# Patient Record
Sex: Female | Born: 1935
Health system: Southern US, Community
[De-identification: ages and names within clinical notes are randomized; demographics above are authoritative.]

## PROBLEM LIST (undated history)

## (undated) DIAGNOSIS — E785 Hyperlipidemia, unspecified: Secondary | ICD-10-CM

## (undated) DIAGNOSIS — K449 Diaphragmatic hernia without obstruction or gangrene: Secondary | ICD-10-CM

## (undated) DIAGNOSIS — K922 Gastrointestinal hemorrhage, unspecified: Secondary | ICD-10-CM

## (undated) DIAGNOSIS — K529 Noninfective gastroenteritis and colitis, unspecified: Secondary | ICD-10-CM

## (undated) DIAGNOSIS — D62 Acute posthemorrhagic anemia: Secondary | ICD-10-CM

## (undated) DIAGNOSIS — K315 Obstruction of duodenum: Secondary | ICD-10-CM

## (undated) DIAGNOSIS — K222 Esophageal obstruction: Secondary | ICD-10-CM

## (undated) DIAGNOSIS — K838 Other specified diseases of biliary tract: Secondary | ICD-10-CM

## (undated) DIAGNOSIS — I10 Essential (primary) hypertension: Secondary | ICD-10-CM

## (undated) DIAGNOSIS — R42 Dizziness and giddiness: Secondary | ICD-10-CM

## (undated) DIAGNOSIS — I4891 Unspecified atrial fibrillation: Secondary | ICD-10-CM

## (undated) DIAGNOSIS — N200 Calculus of kidney: Secondary | ICD-10-CM

## (undated) DIAGNOSIS — K259 Gastric ulcer, unspecified as acute or chronic, without hemorrhage or perforation: Secondary | ICD-10-CM

## (undated) DIAGNOSIS — C4490 Unspecified malignant neoplasm of skin, unspecified: Secondary | ICD-10-CM

## (undated) DIAGNOSIS — K8689 Other specified diseases of pancreas: Secondary | ICD-10-CM

## (undated) DIAGNOSIS — K52839 Microscopic colitis, unspecified: Secondary | ICD-10-CM

## (undated) HISTORY — DX: Obstruction of duodenum: K31.5

## (undated) HISTORY — DX: Other specified diseases of pancreas: K86.89

## (undated) HISTORY — DX: Other specified diseases of biliary tract: K83.8

## (undated) HISTORY — DX: Diaphragmatic hernia without obstruction or gangrene: K44.9

## (undated) HISTORY — DX: Calculus of kidney: N20.0

## (undated) HISTORY — DX: Unspecified atrial fibrillation: I48.91

## (undated) HISTORY — DX: Acute posthemorrhagic anemia: D62

## (undated) HISTORY — DX: Esophageal obstruction: K22.2

## (undated) HISTORY — DX: Gastrointestinal hemorrhage, unspecified: K92.2

## (undated) HISTORY — PX: NOSE SURGERY: SHX723

## (undated) HISTORY — DX: Microscopic colitis, unspecified: K52.839

## (undated) HISTORY — DX: Gastric ulcer, unspecified as acute or chronic, without hemorrhage or perforation: K25.9

---

## 2001-01-16 ENCOUNTER — Ambulatory Visit (HOSPITAL_COMMUNITY): Admission: RE | Admit: 2001-01-16 | Discharge: 2001-01-16 | Payer: Self-pay | Admitting: Internal Medicine

## 2001-10-29 ENCOUNTER — Other Ambulatory Visit: Admission: RE | Admit: 2001-10-29 | Discharge: 2001-10-29 | Payer: Self-pay | Admitting: Dermatology

## 2010-09-30 ENCOUNTER — Emergency Department (HOSPITAL_COMMUNITY)
Admission: EM | Admit: 2010-09-30 | Discharge: 2010-09-30 | Disposition: A | Discharge status: Home / Self Care | Payer: Medicare Other | Attending: Emergency Medicine | Admitting: Emergency Medicine

## 2010-09-30 DIAGNOSIS — N390 Urinary tract infection, site not specified: Secondary | ICD-10-CM | POA: Insufficient documentation

## 2010-09-30 DIAGNOSIS — J329 Chronic sinusitis, unspecified: Secondary | ICD-10-CM | POA: Insufficient documentation

## 2010-09-30 DIAGNOSIS — I10 Essential (primary) hypertension: Secondary | ICD-10-CM | POA: Insufficient documentation

## 2010-09-30 DIAGNOSIS — R300 Dysuria: Secondary | ICD-10-CM | POA: Insufficient documentation

## 2010-09-30 DIAGNOSIS — Z79899 Other long term (current) drug therapy: Secondary | ICD-10-CM | POA: Insufficient documentation

## 2010-09-30 DIAGNOSIS — R42 Dizziness and giddiness: Secondary | ICD-10-CM | POA: Insufficient documentation

## 2010-09-30 DIAGNOSIS — R51 Headache: Secondary | ICD-10-CM | POA: Insufficient documentation

## 2010-09-30 DIAGNOSIS — E7800 Pure hypercholesterolemia, unspecified: Secondary | ICD-10-CM | POA: Insufficient documentation

## 2010-09-30 DIAGNOSIS — R110 Nausea: Secondary | ICD-10-CM | POA: Insufficient documentation

## 2010-09-30 LAB — URINALYSIS, ROUTINE W REFLEX MICROSCOPIC
Bilirubin Urine: NEGATIVE
Glucose, UA: NEGATIVE mg/dL
Hgb urine dipstick: NEGATIVE
Ketones, ur: NEGATIVE mg/dL
Nitrite: POSITIVE — AB
Protein, ur: NEGATIVE mg/dL
Specific Gravity, Urine: <1.005 — ABNORMAL LOW (ref 1.005–1.030)
Urobilinogen, UA: 0.2 mg/dL (ref 0.0–1.0)
pH: 6 (ref 5.0–8.0)

## 2010-09-30 LAB — URINE MICROSCOPIC-ADD ON

## 2010-10-03 LAB — URINE CULTURE
Colony Count: >=100000
Culture  Setup Time: 201207012151

## 2011-01-14 ENCOUNTER — Other Ambulatory Visit (HOSPITAL_COMMUNITY): Payer: Self-pay | Admitting: Family Medicine

## 2011-01-14 DIAGNOSIS — Z139 Encounter for screening, unspecified: Secondary | ICD-10-CM

## 2011-01-21 ENCOUNTER — Ambulatory Visit (HOSPITAL_COMMUNITY): Payer: Self-pay

## 2012-08-24 ENCOUNTER — Emergency Department (HOSPITAL_COMMUNITY)
Admission: EM | Admit: 2012-08-24 | Discharge: 2012-08-24 | Disposition: A | Discharge status: Home / Self Care | Payer: Medicare Other | Attending: Emergency Medicine | Admitting: Emergency Medicine

## 2012-08-24 ENCOUNTER — Emergency Department (HOSPITAL_COMMUNITY): Payer: Medicare Other

## 2012-08-24 ENCOUNTER — Encounter (HOSPITAL_COMMUNITY): Payer: Self-pay

## 2012-08-24 DIAGNOSIS — R5381 Other malaise: Secondary | ICD-10-CM | POA: Insufficient documentation

## 2012-08-24 DIAGNOSIS — E785 Hyperlipidemia, unspecified: Secondary | ICD-10-CM | POA: Insufficient documentation

## 2012-08-24 DIAGNOSIS — Z8719 Personal history of other diseases of the digestive system: Secondary | ICD-10-CM | POA: Insufficient documentation

## 2012-08-24 DIAGNOSIS — R55 Syncope and collapse: Secondary | ICD-10-CM | POA: Insufficient documentation

## 2012-08-24 DIAGNOSIS — I10 Essential (primary) hypertension: Secondary | ICD-10-CM | POA: Insufficient documentation

## 2012-08-24 DIAGNOSIS — Z85828 Personal history of other malignant neoplasm of skin: Secondary | ICD-10-CM | POA: Insufficient documentation

## 2012-08-24 DIAGNOSIS — Z79899 Other long term (current) drug therapy: Secondary | ICD-10-CM | POA: Insufficient documentation

## 2012-08-24 DIAGNOSIS — R109 Unspecified abdominal pain: Secondary | ICD-10-CM | POA: Insufficient documentation

## 2012-08-24 DIAGNOSIS — R197 Diarrhea, unspecified: Secondary | ICD-10-CM | POA: Insufficient documentation

## 2012-08-24 DIAGNOSIS — K529 Noninfective gastroenteritis and colitis, unspecified: Secondary | ICD-10-CM

## 2012-08-24 HISTORY — DX: Hyperlipidemia, unspecified: E78.5

## 2012-08-24 HISTORY — DX: Dizziness and giddiness: R42

## 2012-08-24 HISTORY — DX: Noninfective gastroenteritis and colitis, unspecified: K52.9

## 2012-08-24 HISTORY — DX: Unspecified malignant neoplasm of skin, unspecified: C44.90

## 2012-08-24 HISTORY — DX: Essential (primary) hypertension: I10

## 2012-08-24 LAB — COMPREHENSIVE METABOLIC PANEL
ALT: 13 U/L (ref 0–35)
AST: 24 U/L (ref 0–37)
Albumin: 4.2 g/dL (ref 3.5–5.2)
Alkaline Phosphatase: 83 U/L (ref 39–117)
BUN: 13 mg/dL (ref 6–23)
CO2: 27 mEq/L (ref 19–32)
Calcium: 10.1 mg/dL (ref 8.4–10.5)
Chloride: 99 mEq/L (ref 96–112)
Creatinine, Ser: 0.95 mg/dL (ref 0.50–1.10)
GFR calc Af Amer: 65 mL/min — ABNORMAL LOW (ref >90)
GFR calc non Af Amer: 56 mL/min — ABNORMAL LOW (ref >90)
Glucose, Bld: 104 mg/dL — ABNORMAL HIGH (ref 70–99)
Potassium: 4.1 mEq/L (ref 3.5–5.1)
Sodium: 136 mEq/L (ref 135–145)
Total Bilirubin: 0.2 mg/dL — ABNORMAL LOW (ref 0.3–1.2)
Total Protein: 7.8 g/dL (ref 6.0–8.3)

## 2012-08-24 LAB — CBC WITH DIFFERENTIAL/PLATELET
Basophils Absolute: 0 10*3/uL (ref 0.0–0.1)
Basophils Relative: 0 % (ref 0–1)
Eosinophils Absolute: 0.1 10*3/uL (ref 0.0–0.7)
Eosinophils Relative: 1 % (ref 0–5)
HCT: 37.2 % (ref 36.0–46.0)
Hemoglobin: 12.5 g/dL (ref 12.0–15.0)
Lymphocytes Relative: 15 % (ref 12–46)
Lymphs Abs: 1.6 10*3/uL (ref 0.7–4.0)
MCH: 29.8 pg (ref 26.0–34.0)
MCHC: 33.6 g/dL (ref 30.0–36.0)
MCV: 88.6 fL (ref 78.0–100.0)
Monocytes Absolute: 0.5 10*3/uL (ref 0.1–1.0)
Monocytes Relative: 5 % (ref 3–12)
Neutro Abs: 8.5 10*3/uL — ABNORMAL HIGH (ref 1.7–7.7)
Neutrophils Relative %: 79 % — ABNORMAL HIGH (ref 43–77)
Platelets: 283 10*3/uL (ref 150–400)
RBC: 4.2 MIL/uL (ref 3.87–5.11)
RDW: 13.5 % (ref 11.5–15.5)
WBC: 10.7 10*3/uL — ABNORMAL HIGH (ref 4.0–10.5)

## 2012-08-24 LAB — OCCULT BLOOD, POC DEVICE: Fecal Occult Bld: NEGATIVE

## 2012-08-24 LAB — URINALYSIS, ROUTINE W REFLEX MICROSCOPIC
Bilirubin Urine: NEGATIVE
Glucose, UA: NEGATIVE mg/dL
Hgb urine dipstick: NEGATIVE
Ketones, ur: NEGATIVE mg/dL
Nitrite: NEGATIVE
Protein, ur: NEGATIVE mg/dL
Specific Gravity, Urine: 1.015 (ref 1.005–1.030)
Urobilinogen, UA: 0.2 mg/dL (ref 0.0–1.0)
pH: 6 (ref 5.0–8.0)

## 2012-08-24 LAB — LIPASE, BLOOD: Lipase: 29 U/L (ref 11–59)

## 2012-08-24 LAB — URINE MICROSCOPIC-ADD ON

## 2012-08-24 LAB — TROPONIN I: Troponin I: <0.3 ng/mL (ref <0.30)

## 2012-08-24 LAB — CLOSTRIDIUM DIFFICILE BY PCR: Toxigenic C. Difficile by PCR: NEGATIVE

## 2012-08-24 IMAGING — CT CT ABD-PELV W/ CM
2 of 5 series · 16 of 46 positions shown, 18 images · IV contrast (Omnipaque 300)
Comparison: None.

CLINICAL DATA: Lower abdominal pain, diarrhea

CT ABDOMEN AND PELVIS WITH CONTRAST
TECHNIQUE: Multidetector CT imaging of the abdomen and pelvis was
performed following the standard protocol during bolus
administration of intravenous contrast.
Contrast: 100 ml [XR] IV

[Series 2: abd_pel_with 5.0 b40f · axial · 0.63mm/px · z∈[-423,-38]mm · 13 of 87 slices shown, 15 images]
[im 5/87  soft-tissue]
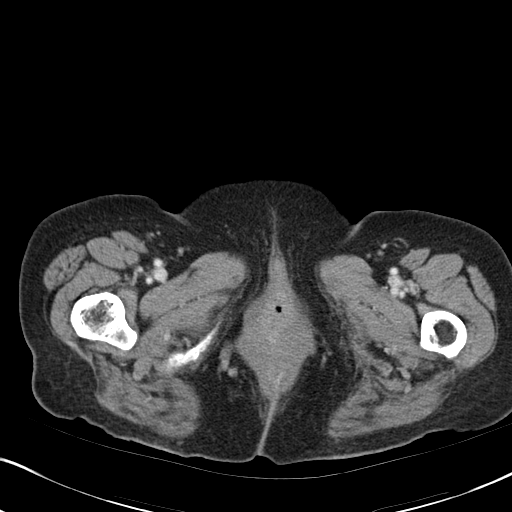
[im 5/87  bone]
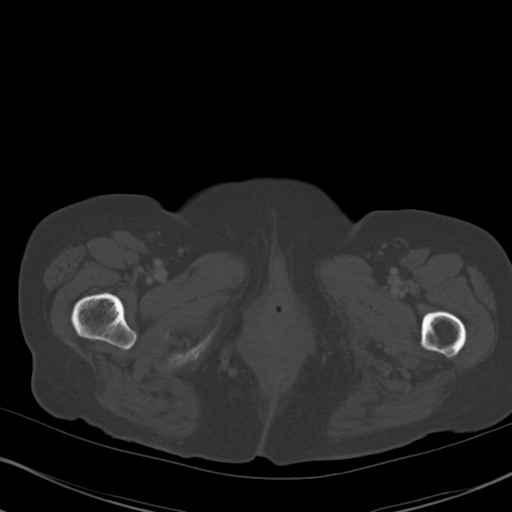
[im 10/87  soft-tissue]
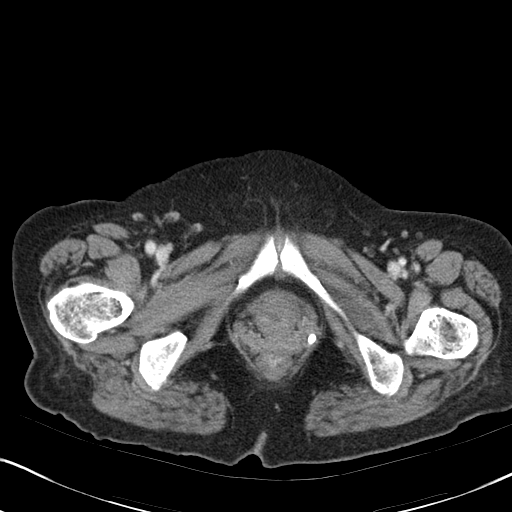
[im 20/87  soft-tissue]
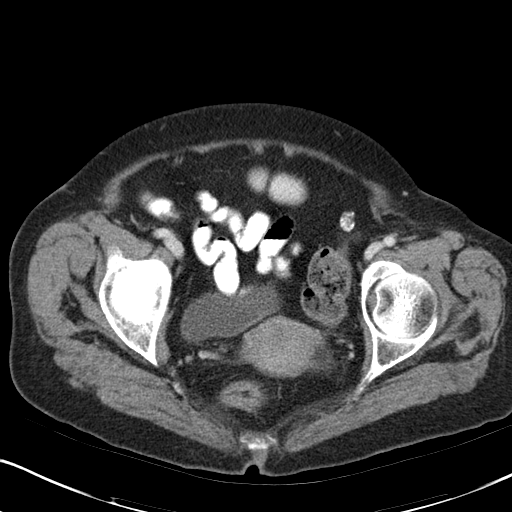
[im 24/87  soft-tissue]
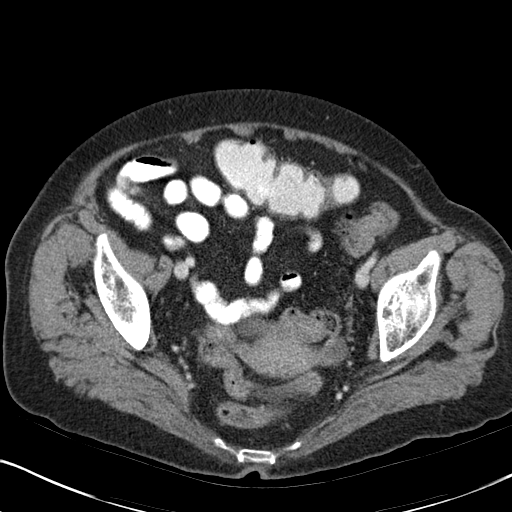
[im 29/87  soft-tissue]
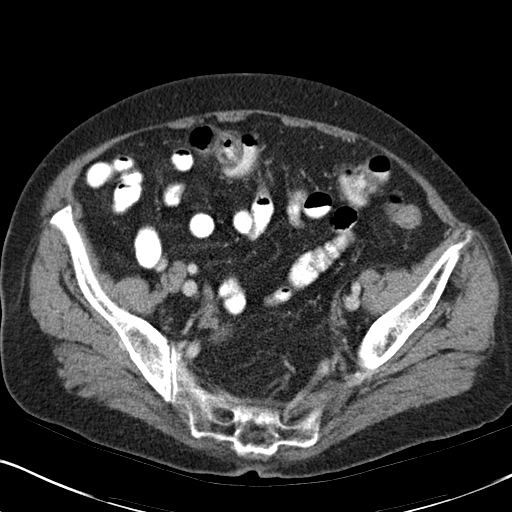
[im 39/87  soft-tissue]
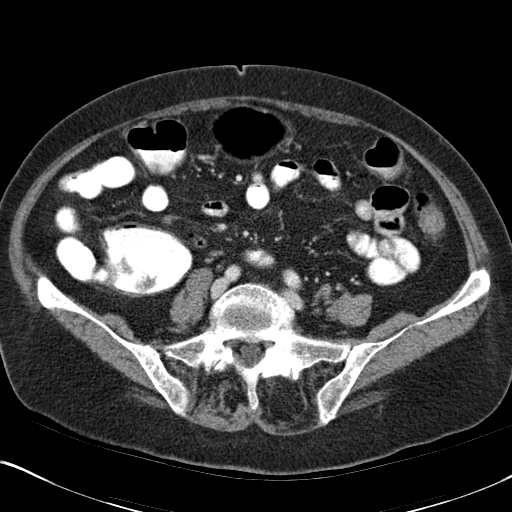
[im 44/87  soft-tissue]
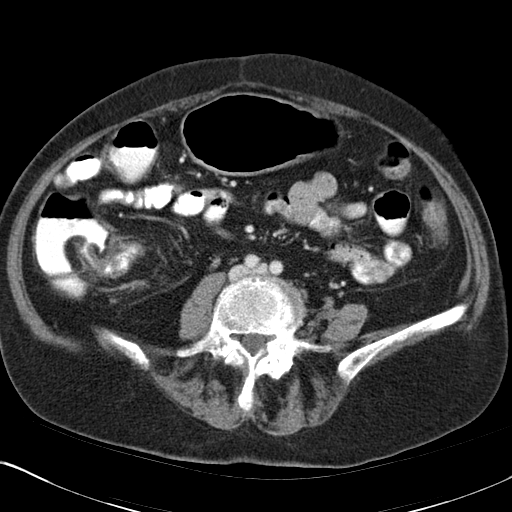
[im 48/87  soft-tissue]
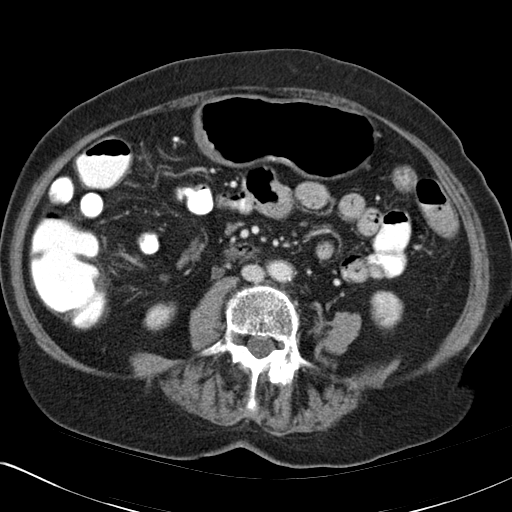
[im 58/87  soft-tissue]
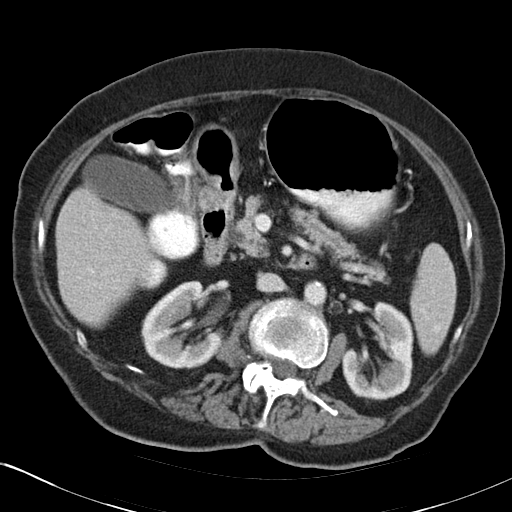
[im 58/87  bone]
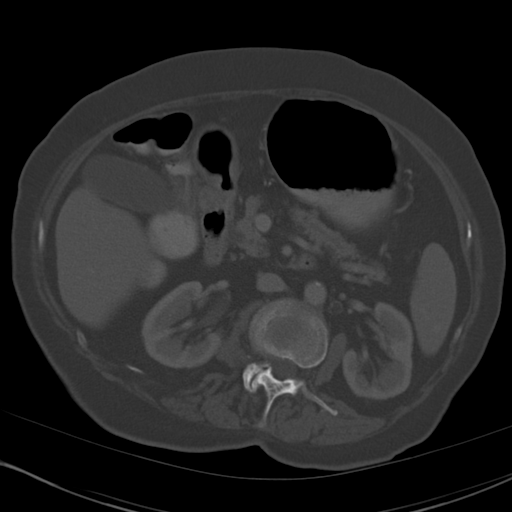
[im 63/87  soft-tissue]
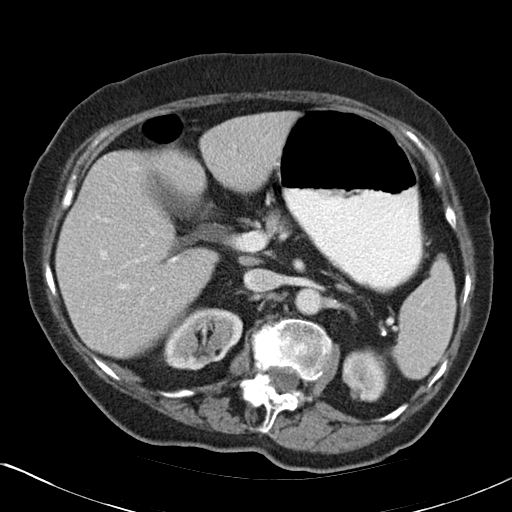
[im 67/87  soft-tissue]
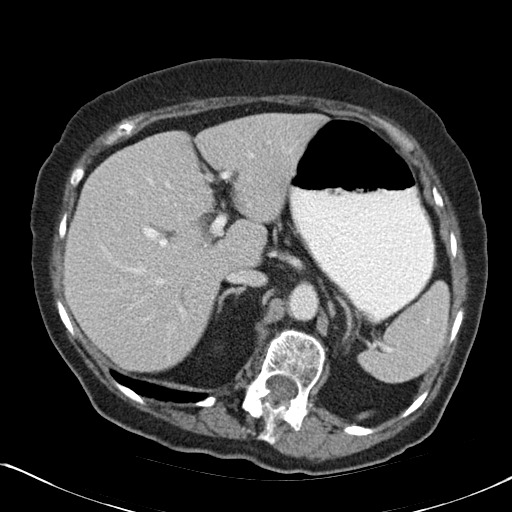
[im 77/87  soft-tissue]
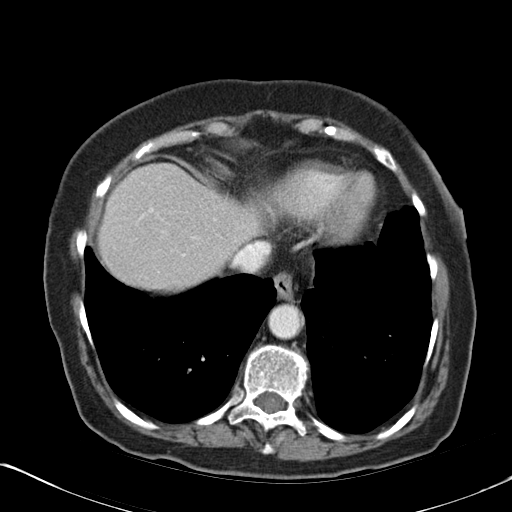
[im 82/87  soft-tissue]
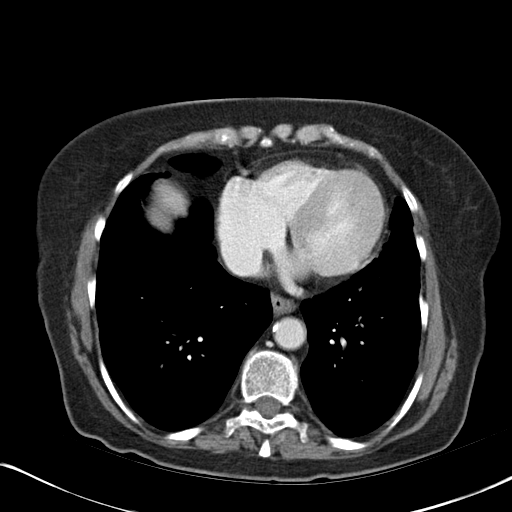

[Series 4: abd_pel_with 3.0 spo · coronal · 0.64mm/px · 3 of 88 slices shown]
[im 30/88  soft-tissue]
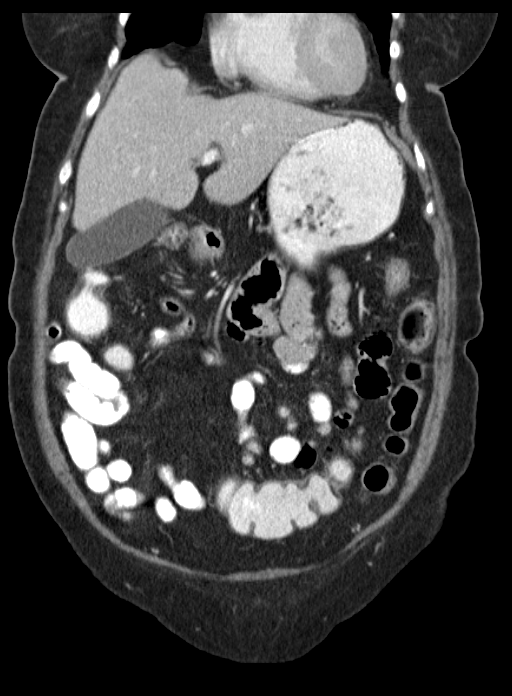
[im 39/88  soft-tissue]
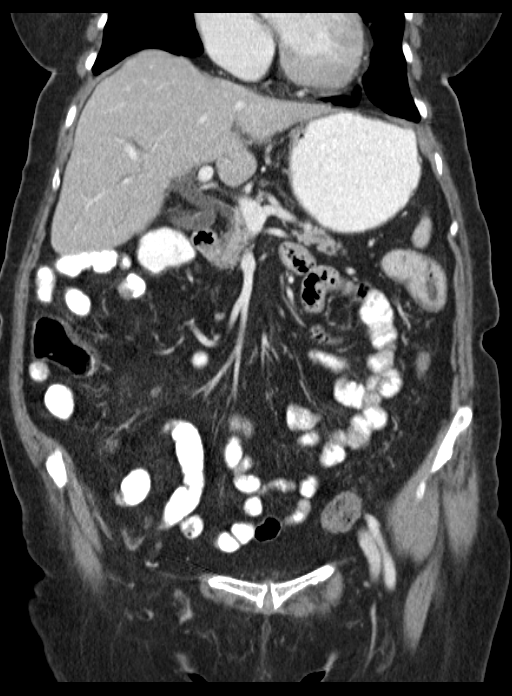
[im 49/88  soft-tissue]
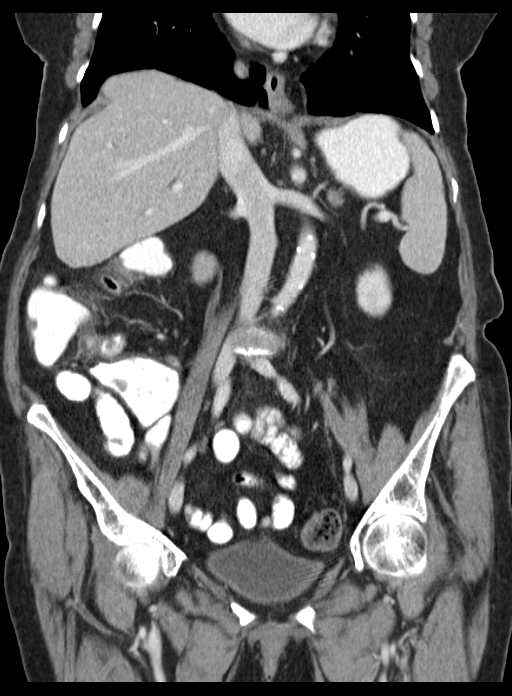

[16 of 46 positions shown; findings below may reference images not displayed]

FINDINGS: Mild scarring versus atelectasis at the medial left lung
base.

Liver, spleen, and adrenal glands are within normal limits.

Diffuse pancreatic atrophy.  No pancreatic mass is seen.

Gallbladder is mildly distended.  Mild central intrahepatic ductal
dilatation.  Common duct measures 9 mm, at the upper limits of
normal for age.

1.9 x 1.6 posterior interpolar right renal cyst.  Cortical scarring
along the posteromedial right upper pole.   3 mm nonobstructing
right lower pole renal calculus (series 2/image 37).  Left kidney
is within normal limits.  No hydronephrosis.

No evidence of bowel obstruction.  Normal appendix.  No bowel wall
thickening or inflammatory changes.

Atherosclerotic calcifications of the abdominal aorta and branch
vessels.

No abdominopelvic ascites.

No suspicious abdominopelvic lymphadenopathy.

Uterus and bilateral ovaries are unremarkable.

Bladder is mildly thick-walled.

Degenerative changes of the visualized thoracolumbar spine.  Mild S-
shaped thoracolumbar scoliosis.
IMPRESSION: No evidence of bowel obstruction.  Normal appendix.

No bowel wall thickening or inflammatory changes.

3 mm nonobstructing right lower pole renal calculus.  No
hydronephrosis.

Common duct measures 9 mm, at the upper limits of normal for age,
with mild central intrahepatic ductal dilatation.  This appearance
is of questionable clinical significance in the setting of normal
LFTs.

## 2012-08-24 IMAGING — CR DG CHEST 2V
2 series · 2 of 2 positions shown · non-contrast
Comparison: None.

CLINICAL DATA: Dizziness

CHEST - 2 VIEW

[view not recorded (1 of 2)]
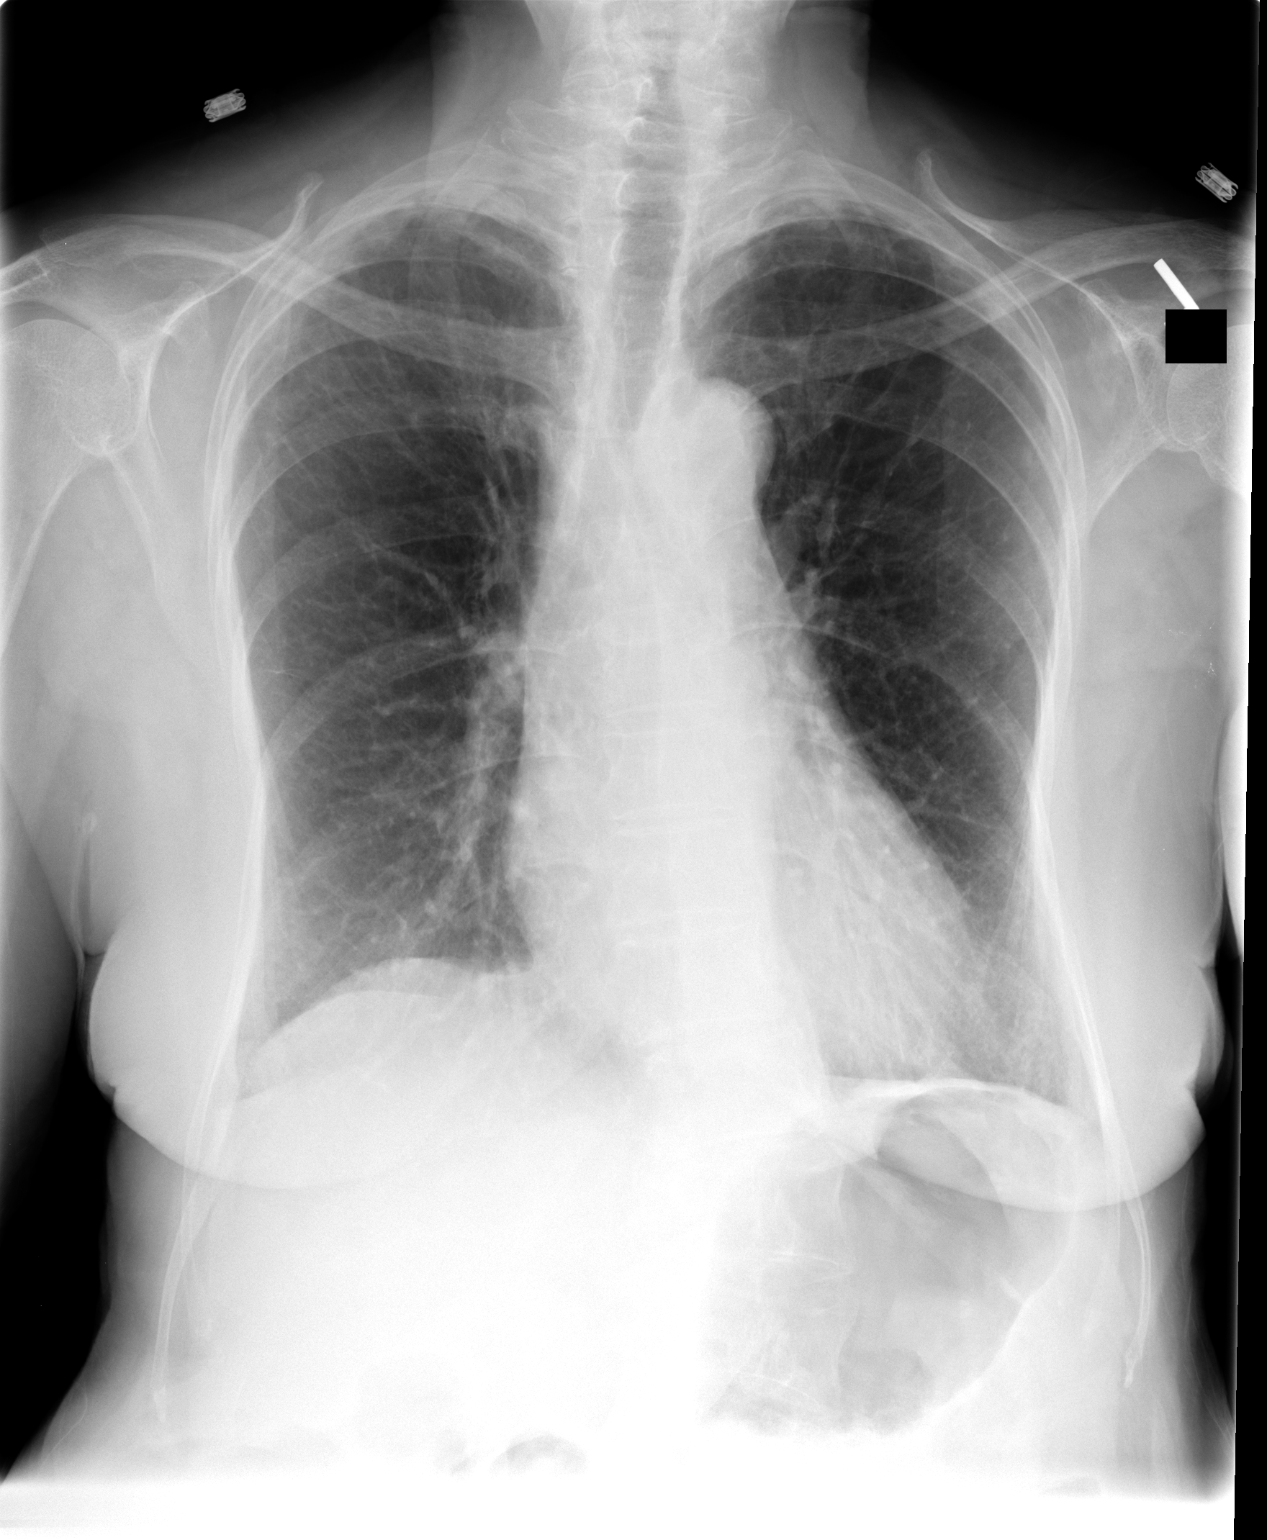

[view not recorded (2 of 2)]
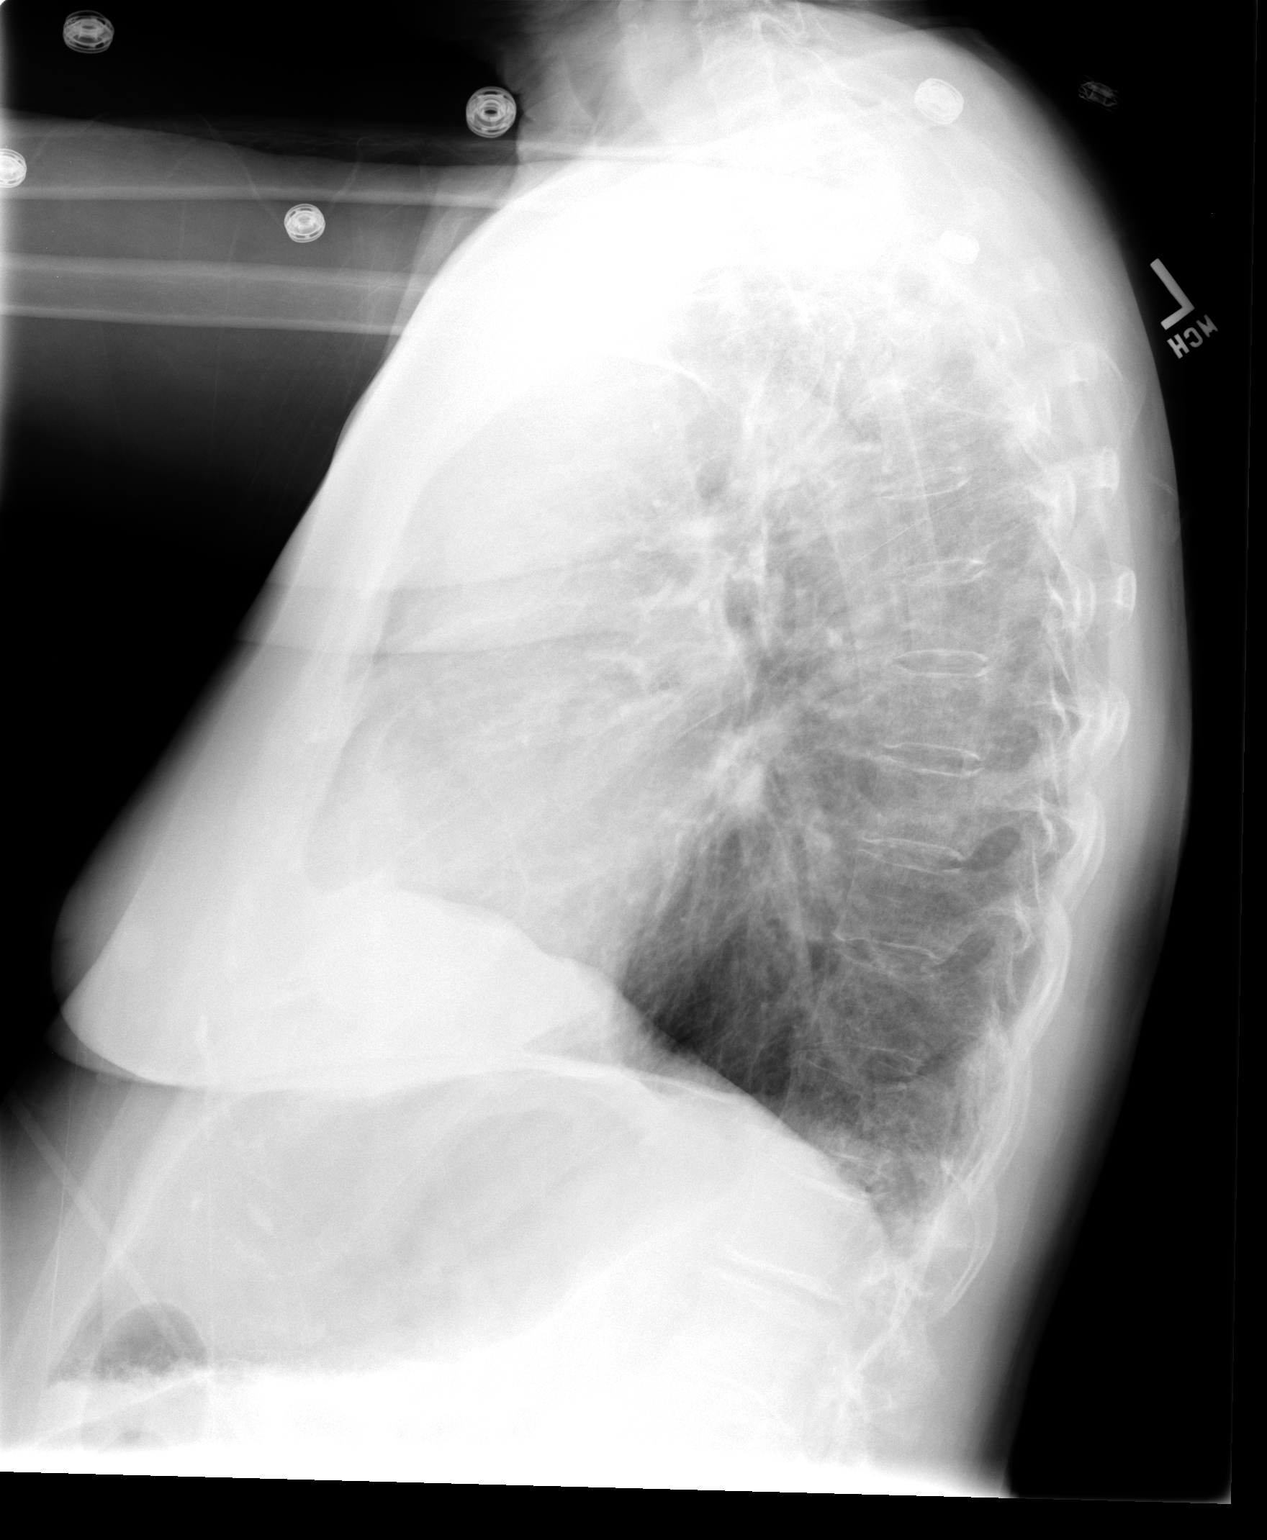

[2 of 2 positions shown; findings below may reference images not displayed]

FINDINGS: Normal cardiac silhouette.  Lungs hyperinflated.  No
effusion, infiltrate, pneumothorax.  There is some scarring at the
lung apices.
IMPRESSION: No acute cardiopulmonary process.  Hyperinflated lungs.

## 2012-08-24 MED ORDER — IOHEXOL 300 MG/ML  SOLN
100.0000 mL | Freq: Once | INTRAMUSCULAR | Status: AC | PRN
  Administered 2012-08-24: 100 mL via INTRAVENOUS

## 2012-08-24 MED ORDER — IOHEXOL 300 MG/ML  SOLN
50.0000 mL | Freq: Once | INTRAMUSCULAR | Status: AC | PRN
  Administered 2012-08-24: 50 mL via ORAL

## 2012-08-24 MED ORDER — SODIUM CHLORIDE 0.9 % IV SOLN
INTRAVENOUS | Status: DC
  Administered 2012-08-24: 17:00:00 via INTRAVENOUS

## 2012-08-24 NOTE — ED Notes (Signed)
Pt reports diarrhea for about a week.  Pt reports going walking today and felt dizzy and lightheaded.  Pt reports "i felt like i may pass out".

## 2012-08-24 NOTE — ED Provider Notes (Signed)
History     CSN: [id number]  Arrival date & time 08/24/12  1330   First MD Initiated Contact with Patient 08/24/12 1622      Chief Complaint  Patient presents with  . Diarrhea  . Dizziness    HPI Pt was seen at 1635.   Per pt, c/o sudden onset and resolution of one episode of lightheadedness/near syncope that began PTA. Pt states she has hx of chronic diarrhea, but for the past 1 week it has been "more than usual."  States she was walking with a family member this afternoon, when she suddenly felt like she "needed to have a BM." This was followed by having a BM, then feeling generally weak and lightheaded.  States she "needed help getting back home and into the shower."  States the diarrhea was preceded by lower abd "cramping." Describes the stools as "loose."  States all her symptoms are currently resolved.  Denies recent travel or abx.  Denies back pain, no N/V, no CP/SOB, no palpitations, no fevers, no black or blood in stools, no syncope, no focal motor weakness, no tingling/numbness in extremities.    GI: Dr. Kendell Bane Past Medical History  Diagnosis Date  . Hypertension   . Hyperlipemia   . History of stomach ulcers   . Skin cancer   . Chronic diarrhea   . Vertigo     History reviewed. No pertinent past surgical history.    History  Substance Use Topics  . Smoking status: Never Smoker   . Smokeless tobacco: Not on file  . Alcohol Use: No      Review of Systems ROS: Statement: All systems negative except as marked or noted in the HPI; Constitutional: Negative for fever and chills. ; ; Eyes: Negative for eye pain, redness and discharge. ; ; ENMT: Negative for ear pain, hoarseness, nasal congestion, sinus pressure and sore throat. ; ; Cardiovascular: Negative for chest pain, palpitations, diaphoresis, dyspnea and peripheral edema. ; ; Respiratory: Negative for cough, wheezing and stridor. ; ; Gastrointestinal: +diarrhea, abd pain. Negative for nausea, vomiting, blood in  stool, hematemesis, jaundice and rectal bleeding. . ; ; Genitourinary: Negative for dysuria, flank pain and hematuria. ; ; Musculoskeletal: Negative for back pain and neck pain. Negative for swelling and trauma.; ; Skin: Negative for pruritus, rash, abrasions, blisters, bruising and skin lesion.; ; Neuro: +generalized weakness/lightheadedness. Negative for headache and neck stiffness. Negative for altered level of consciousness , altered mental status, extremity weakness, paresthesias, involuntary movement, seizure and syncope.      Allergies  Codeine and Sulfa antibiotics  Home Medications   Current Outpatient Rx  Name  Route  Sig  Dispense  Refill  . atenolol (TENORMIN) 50 MG tablet   Oral   Take 50 mg by mouth daily.         Marland Kitchen lisinopril (PRINIVIL,ZESTRIL) 2.5 MG tablet   Oral   Take 2.5 mg by mouth daily.         . meclizine (ANTIVERT) 25 MG tablet   Oral   Take 25 mg by mouth 2 (two) times daily as needed for dizziness (VERTIGO).         Marland Kitchen omeprazole (PRILOSEC) 20 MG capsule   Oral   Take 20 mg by mouth daily as needed (for acid reflux/GERD).         Marland Kitchen potassium chloride (K-DUR) 10 MEQ tablet   Oral   Take 10-20 mEq by mouth 2 (two) times daily. Takes two in the morning and  one in the evening daily         . simvastatin (ZOCOR) 40 MG tablet   Oral   Take 40 mg by mouth at bedtime.         . triamterene-hydrochlorothiazide (MAXZIDE-25) 37.5-25 MG per tablet   Oral   Take 1 tablet by mouth daily.           BP 144/71  Pulse 79  Temp(Src) 97.4 F (36.3 C) (Oral)  Resp 20  Ht 5\' 6"  (1.676 m)  Wt 120 lb (54.432 kg)  BMI 19.38 kg/m2  SpO2 100%  Physical Exam 1640: Physical examination:  Nursing notes reviewed; Vital signs and O2 SAT reviewed;  Constitutional: Well developed, Well nourished, Well hydrated, In no acute distress; Head:  Normocephalic, atraumatic; Eyes: EOMI, PERRL, No scleral icterus; ENMT: Mouth and pharynx normal, Mucous membranes  moist; Neck: Supple, Full range of motion, No lymphadenopathy; Cardiovascular: Regular rate and rhythm, No murmur, rub, or gallop; Respiratory: Breath sounds clear & equal bilaterally, No rales, rhonchi, wheezes.  Speaking full sentences with ease, Normal respiratory effort/excursion; Chest: Nontender, Movement normal; Abdomen: Soft, Nontender, Nondistended, Normal bowel sounds. Rectal exam performed w/permission of pt and ED RN chaperone present.  Anal tone normal.  Non-tender, soft brown stool in rectal vault, heme neg.  No fissures, no external hemorrhoids, no palp masses.;;; Genitourinary: No CVA tenderness; Extremities: Pulses normal, No tenderness, No edema, No calf edema or asymmetry.; Neuro: AA&Ox3, Major CN grossly intact. No facial droop. Speech clear. No gross focal motor or sensory deficits in extremities.; Skin: Color normal, Warm, Dry.   ED Course  Procedures     MDM  MDM Reviewed: previous chart, nursing note and vitals Reviewed previous: labs Interpretation: labs, x-ray, CT scan and ECG    Date: 08/24/2012  Rate: 61  Rhythm: normal sinus rhythm and sinus arrhythmia  QRS Axis: left  Intervals: normal  ST/T Wave abnormalities: normal  Conduction Disutrbances:none  Narrative Interpretation: LVH  Old EKG Reviewed: none available  Results for orders placed during the hospital encounter of 08/24/12  CLOSTRIDIUM DIFFICILE BY PCR      Result Value Range   C difficile by pcr NEGATIVE  NEGATIVE  URINALYSIS, ROUTINE W REFLEX MICROSCOPIC      Result Value Range   Color, Urine YELLOW  YELLOW   APPearance HAZY (*) CLEAR   Specific Gravity, Urine 1.015  1.005 - 1.030   pH 6.0  5.0 - 8.0   Glucose, UA NEGATIVE  NEGATIVE mg/dL   Hgb urine dipstick NEGATIVE  NEGATIVE   Bilirubin Urine NEGATIVE  NEGATIVE   Ketones, ur NEGATIVE  NEGATIVE mg/dL   Protein, ur NEGATIVE  NEGATIVE mg/dL   Urobilinogen, UA 0.2  0.0 - 1.0 mg/dL   Nitrite NEGATIVE  NEGATIVE   Leukocytes, UA SMALL (*)  NEGATIVE  CBC WITH DIFFERENTIAL      Result Value Range   WBC 10.7 (*) 4.0 - 10.5 K/uL   RBC 4.20  3.87 - 5.11 MIL/uL   Hemoglobin 12.5  12.0 - 15.0 g/dL   HCT 96.0  45.4 - 09.8 %   MCV 88.6  78.0 - 100.0 fL   MCH 29.8  26.0 - 34.0 pg   MCHC 33.6  30.0 - 36.0 g/dL   RDW 11.9  14.7 - 82.9 %   Platelets 283  150 - 400 K/uL   Neutrophils Relative % 79 (*) 43 - 77 %   Neutro Abs 8.5 (*) 1.7 - 7.7 K/uL  Lymphocytes Relative 15  12 - 46 %   Lymphs Abs 1.6  0.7 - 4.0 K/uL   Monocytes Relative 5  3 - 12 %   Monocytes Absolute 0.5  0.1 - 1.0 K/uL   Eosinophils Relative 1  0 - 5 %   Eosinophils Absolute 0.1  0.0 - 0.7 K/uL   Basophils Relative 0  0 - 1 %   Basophils Absolute 0.0  0.0 - 0.1 K/uL  COMPREHENSIVE METABOLIC PANEL      Result Value Range   Sodium 136  135 - 145 mEq/L   Potassium 4.1  3.5 - 5.1 mEq/L   Chloride 99  96 - 112 mEq/L   CO2 27  19 - 32 mEq/L   Glucose, Bld 104 (*) 70 - 99 mg/dL   BUN 13  6 - 23 mg/dL   Creatinine, Ser 4.54  0.50 - 1.10 mg/dL   Calcium 09.8  8.4 - 11.9 mg/dL   Total Protein 7.8  6.0 - 8.3 g/dL   Albumin 4.2  3.5 - 5.2 g/dL   AST 24  0 - 37 U/L   ALT 13  0 - 35 U/L   Alkaline Phosphatase 83  39 - 117 U/L   Total Bilirubin 0.2 (*) 0.3 - 1.2 mg/dL   GFR calc non Af Amer 56 (*) >90 mL/min   GFR calc Af Amer 65 (*) >90 mL/min  LIPASE, BLOOD      Result Value Range   Lipase 29  11 - 59 U/L  TROPONIN I      Result Value Range   Troponin I <0.30  <0.30 ng/mL  URINE MICROSCOPIC-ADD ON      Result Value Range   Squamous Epithelial / LPF MANY (*) RARE   WBC, UA 3-6  <3 WBC/hpf   RBC / HPF 0-2  <3 RBC/hpf   Bacteria, UA RARE  RARE  OCCULT BLOOD, POC DEVICE      Result Value Range   Fecal Occult Bld NEGATIVE  NEGATIVE   Dg Chest 2 View 08/24/2012   *RADIOLOGY REPORT*  Clinical Data: Dizziness  CHEST - 2 VIEW  Comparison: None.  Findings: Normal cardiac silhouette.  Lungs hyperinflated.  No effusion, infiltrate, pneumothorax.  There is some  scarring at the lung apices.  IMPRESSION: No acute cardiopulmonary process.  Hyperinflated lungs.   Original Report Authenticated By: Genevive Bi, M.D.   Ct Abdomen Pelvis W Contrast 08/24/2012   *RADIOLOGY REPORT*  Clinical Data: Lower abdominal pain, diarrhea  CT ABDOMEN AND PELVIS WITH CONTRAST  Technique:  Multidetector CT imaging of the abdomen and pelvis was performed following the standard protocol during bolus administration of intravenous contrast.  Contrast: 100 ml Omnipaque-300 IV  Comparison: None.  Findings: Mild scarring versus atelectasis at the medial left lung base.  Liver, spleen, and adrenal glands are within normal limits.  Diffuse pancreatic atrophy.  No pancreatic mass is seen.  Gallbladder is mildly distended.  Mild central intrahepatic ductal dilatation.  Common duct measures 9 mm, at the upper limits of normal for age.  1.9 x 1.6 posterior interpolar right renal cyst.  Cortical scarring along the posteromedial right upper pole.   3 mm nonobstructing right lower pole renal calculus (series 2/image 37).  Left kidney is within normal limits.  No hydronephrosis.  No evidence of bowel obstruction.  Normal appendix.  No bowel wall thickening or inflammatory changes.  Atherosclerotic calcifications of the abdominal aorta and branch vessels.  No abdominopelvic ascites.  No  suspicious abdominopelvic lymphadenopathy.  Uterus and bilateral ovaries are unremarkable.  Bladder is mildly thick-walled.  Degenerative changes of the visualized thoracolumbar spine.  Mild S- shaped thoracolumbar scoliosis.  IMPRESSION: No evidence of bowel obstruction.  Normal appendix.  No bowel wall thickening or inflammatory changes.  3 mm nonobstructing right lower pole renal calculus.  No hydronephrosis.  Common duct measures 9 mm, at the upper limits of normal for age, with mild central intrahepatic ductal dilatation.  This appearance is of questionable clinical significance in the setting of normal LFTs.   Original  Report Authenticated By: Charline Bills, M.D.    2045:  No clear UTI on Udip; UC pending.  Cdiff negative, stool culture is pending. Pt is not orthostatic, has ambulated several times in the ED with steady gait and easy resps, NAD. Neuro exam exact and unchanged. Pt has tol PO well while in the ED without N/V. Abd remains benign. Wants to go home now. Dx and testing d/w pt and family.  Questions answered.  Verb understanding, agreeable to d/c home with outpt f/u.           Laray Anger, DO 08/26/12 1420

## 2012-08-25 LAB — URINE CULTURE: Colony Count: 40000

## 2012-08-26 NOTE — ED Notes (Signed)
Post ED Visit - Positive Culture Follow-up  Culture report reviewed by antimicrobial stewardship pharmacist: []  Wes Dulaney, Pharm.D., BCPS []  Celedonio Miyamoto, Pharm.D., BCPS []  Georgina Pillion, Pharm.D., BCPS [x]  [place], [place].D., BCPS, AAHIVP []  Estella Husk, Pharm.D., BCPS, AAHIV  Positive urine culture d no further patient follow-up is required at this time.  Larena Sox 08/26/2012, 5:13 PM

## 2012-08-29 LAB — STOOL CULTURE

## 2013-02-26 ENCOUNTER — Emergency Department (HOSPITAL_COMMUNITY): Payer: Medicare Other

## 2013-02-26 ENCOUNTER — Inpatient Hospital Stay (HOSPITAL_COMMUNITY)
Admission: EM | Admit: 2013-02-26 | Discharge: 2013-03-03 | DRG: 378 | Disposition: A | Discharge status: Home / Self Care | Payer: Medicare Other | Attending: Internal Medicine | Admitting: Internal Medicine

## 2013-02-26 ENCOUNTER — Encounter (HOSPITAL_COMMUNITY): Payer: Self-pay | Admitting: Emergency Medicine

## 2013-02-26 DIAGNOSIS — I959 Hypotension, unspecified: Secondary | ICD-10-CM | POA: Diagnosis present

## 2013-02-26 DIAGNOSIS — E440 Moderate protein-calorie malnutrition: Secondary | ICD-10-CM | POA: Insufficient documentation

## 2013-02-26 DIAGNOSIS — N329 Bladder disorder, unspecified: Secondary | ICD-10-CM | POA: Diagnosis present

## 2013-02-26 DIAGNOSIS — K8689 Other specified diseases of pancreas: Secondary | ICD-10-CM

## 2013-02-26 DIAGNOSIS — N390 Urinary tract infection, site not specified: Secondary | ICD-10-CM

## 2013-02-26 DIAGNOSIS — I10 Essential (primary) hypertension: Secondary | ICD-10-CM

## 2013-02-26 DIAGNOSIS — K922 Gastrointestinal hemorrhage, unspecified: Secondary | ICD-10-CM | POA: Diagnosis present

## 2013-02-26 DIAGNOSIS — R197 Diarrhea, unspecified: Secondary | ICD-10-CM

## 2013-02-26 DIAGNOSIS — D62 Acute posthemorrhagic anemia: Secondary | ICD-10-CM

## 2013-02-26 DIAGNOSIS — R634 Abnormal weight loss: Secondary | ICD-10-CM

## 2013-02-26 DIAGNOSIS — K260 Acute duodenal ulcer with hemorrhage: Secondary | ICD-10-CM

## 2013-02-26 DIAGNOSIS — B9689 Other specified bacterial agents as the cause of diseases classified elsewhere: Secondary | ICD-10-CM | POA: Diagnosis present

## 2013-02-26 DIAGNOSIS — E785 Hyperlipidemia, unspecified: Secondary | ICD-10-CM | POA: Diagnosis present

## 2013-02-26 DIAGNOSIS — K264 Chronic or unspecified duodenal ulcer with hemorrhage: Principal | ICD-10-CM | POA: Diagnosis present

## 2013-02-26 HISTORY — DX: Other specified diseases of pancreas: K86.89

## 2013-02-26 LAB — URINALYSIS W MICROSCOPIC + REFLEX CULTURE
Bilirubin Urine: NEGATIVE
Glucose, UA: NEGATIVE mg/dL
Ketones, ur: NEGATIVE mg/dL
Nitrite: POSITIVE — AB
Protein, ur: NEGATIVE mg/dL
Specific Gravity, Urine: 1.02 (ref 1.005–1.030)
Urobilinogen, UA: 0.2 mg/dL (ref 0.0–1.0)
pH: 6 (ref 5.0–8.0)

## 2013-02-26 LAB — CBC WITH DIFFERENTIAL/PLATELET
Basophils Absolute: 0 10*3/uL (ref 0.0–0.1)
Basophils Relative: 0 % (ref 0–1)
Eosinophils Absolute: 0.2 10*3/uL (ref 0.0–0.7)
Eosinophils Relative: 2 % (ref 0–5)
HCT: 29.7 % — ABNORMAL LOW (ref 36.0–46.0)
Hemoglobin: 9.9 g/dL — ABNORMAL LOW (ref 12.0–15.0)
Lymphocytes Relative: 27 % (ref 12–46)
Lymphs Abs: 1.7 10*3/uL (ref 0.7–4.0)
MCH: 30.1 pg (ref 26.0–34.0)
MCHC: 33.3 g/dL (ref 30.0–36.0)
MCV: 90.3 fL (ref 78.0–100.0)
Monocytes Absolute: 0.3 10*3/uL (ref 0.1–1.0)
Monocytes Relative: 5 % (ref 3–12)
Neutro Abs: 4.3 10*3/uL (ref 1.7–7.7)
Neutrophils Relative %: 66 % (ref 43–77)
Platelets: 289 10*3/uL (ref 150–400)
RBC: 3.29 MIL/uL — ABNORMAL LOW (ref 3.87–5.11)
RDW: 13.4 % (ref 11.5–15.5)
WBC: 6.5 10*3/uL (ref 4.0–10.5)

## 2013-02-26 LAB — COMPREHENSIVE METABOLIC PANEL
ALT: 9 U/L (ref 0–35)
AST: 14 U/L (ref 0–37)
Albumin: 3.7 g/dL (ref 3.5–5.2)
Alkaline Phosphatase: 66 U/L (ref 39–117)
BUN: 36 mg/dL — ABNORMAL HIGH (ref 6–23)
CO2: 26 mEq/L (ref 19–32)
Calcium: 9.6 mg/dL (ref 8.4–10.5)
Chloride: 101 mEq/L (ref 96–112)
Creatinine, Ser: 0.95 mg/dL (ref 0.50–1.10)
GFR calc Af Amer: 63 mL/min — ABNORMAL LOW (ref >90)
GFR calc non Af Amer: 54 mL/min — ABNORMAL LOW (ref >90)
Glucose, Bld: 99 mg/dL (ref 70–99)
Potassium: 3.7 mEq/L (ref 3.5–5.1)
Sodium: 138 mEq/L (ref 135–145)
Total Bilirubin: 0.2 mg/dL — ABNORMAL LOW (ref 0.3–1.2)
Total Protein: 7.4 g/dL (ref 6.0–8.3)

## 2013-02-26 LAB — OCCULT BLOOD, POC DEVICE: Fecal Occult Bld: POSITIVE — AB

## 2013-02-26 LAB — LACTIC ACID, PLASMA: Lactic Acid, Venous: 1.1 mmol/L (ref 0.5–2.2)

## 2013-02-26 LAB — CLOSTRIDIUM DIFFICILE BY PCR: Toxigenic C. Difficile by PCR: NEGATIVE

## 2013-02-26 LAB — LIPASE, BLOOD: Lipase: 27 U/L (ref 11–59)

## 2013-02-26 LAB — TROPONIN I: Troponin I: <0.3 ng/mL (ref <0.30)

## 2013-02-26 IMAGING — CT CT ABD-PELV W/ CM
2 of 4 series · 16 of 46 positions shown, 18 images · IV contrast (omnipaque)
Comparison: [DATE]

CLINICAL DATA: Rectal bleeding, abdominal pain.

EXAM:
CT ABDOMEN AND PELVIS WITH CONTRAST
TECHNIQUE: Multidetector CT imaging of the abdomen and pelvis was performed
using the standard protocol following bolus administration of
intravenous contrast.
CONTRAST:  50mL OMNIPAQUE IOHEXOL 300 MG/ML SOLN, 100mL OMNIPAQUE
IOHEXOL 300 MG/ML SOLN

[Series 2: abd_pel_with 5.0 b40f · axial · 0.73mm/px · z∈[-403,-18]mm · 13 of 87 slices shown, 15 images]
[im 5/87  soft-tissue]
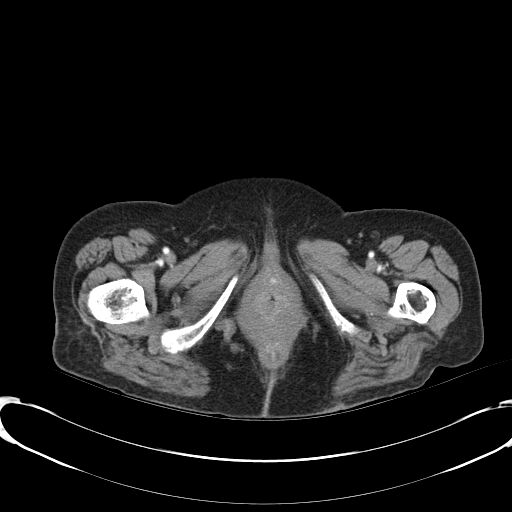
[im 5/87  bone]
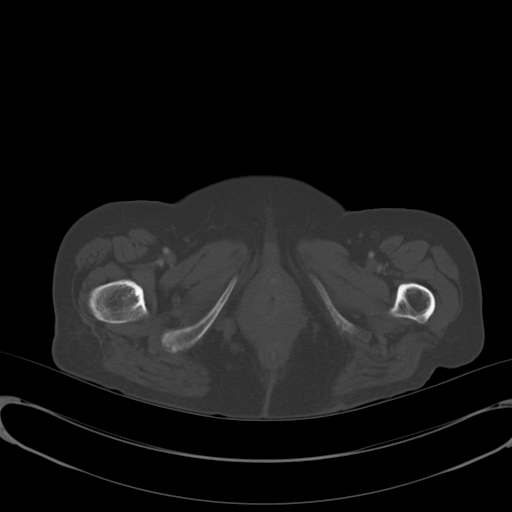
[im 13/87  soft-tissue]
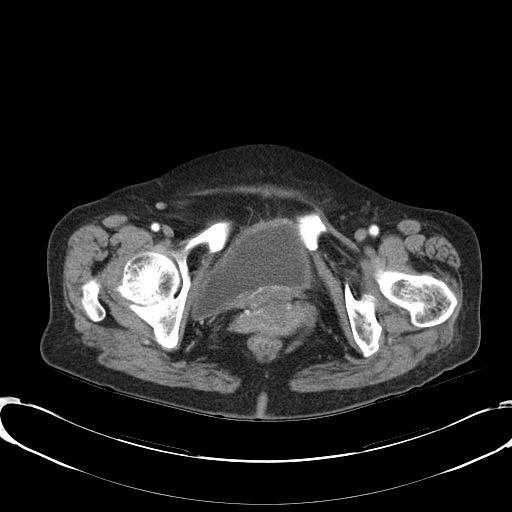
[im 18/87  soft-tissue]
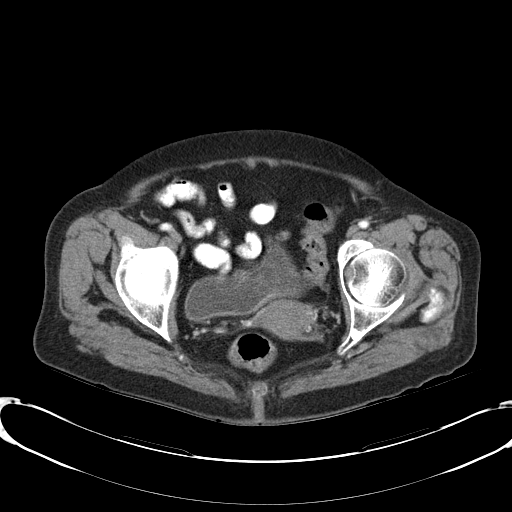
[im 26/87  soft-tissue]
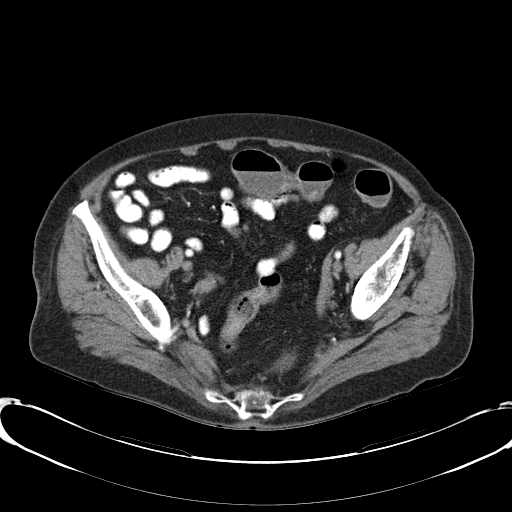
[im 31/87  soft-tissue]
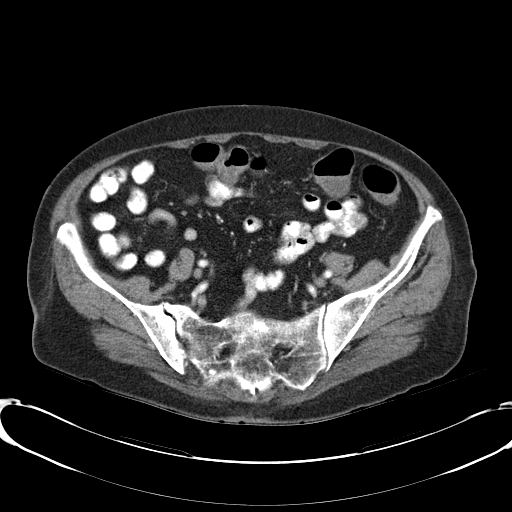
[im 39/87  soft-tissue]
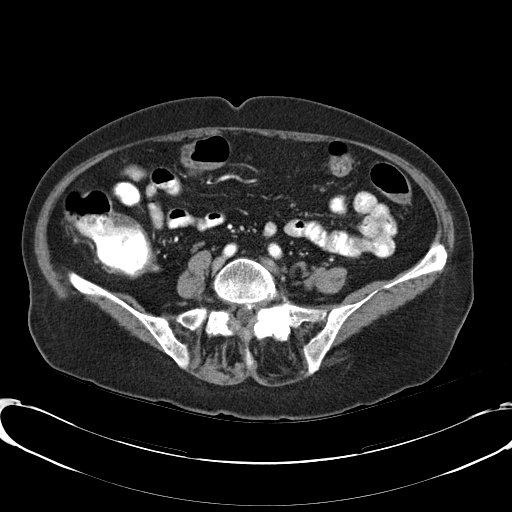
[im 44/87  soft-tissue]
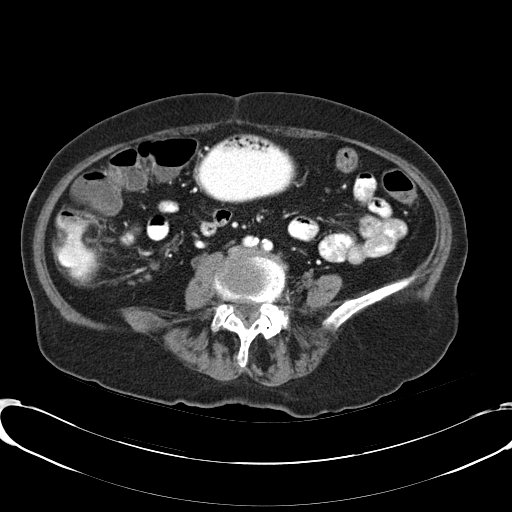
[im 48/87  soft-tissue]
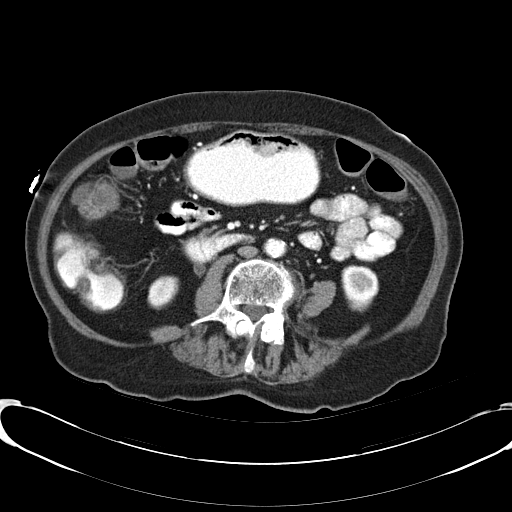
[im 56/87  soft-tissue]
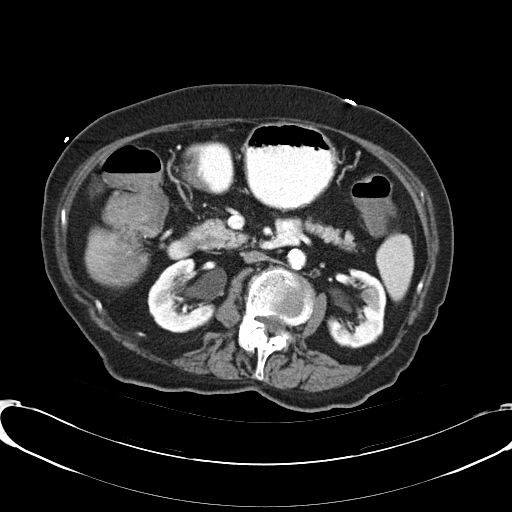
[im 56/87  bone]
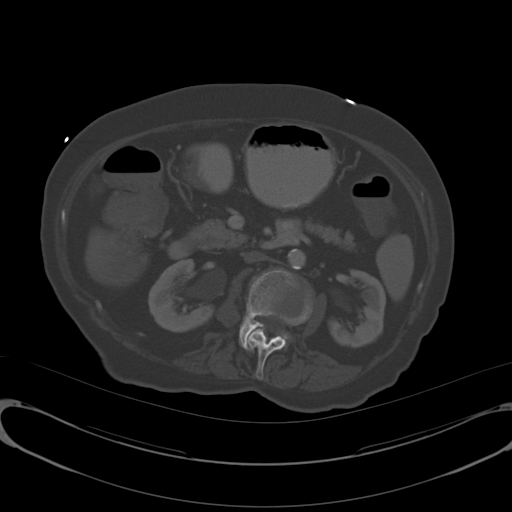
[im 61/87  soft-tissue]
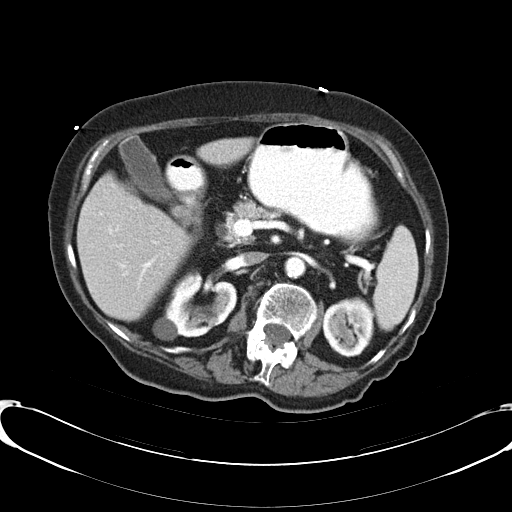
[im 69/87  soft-tissue]
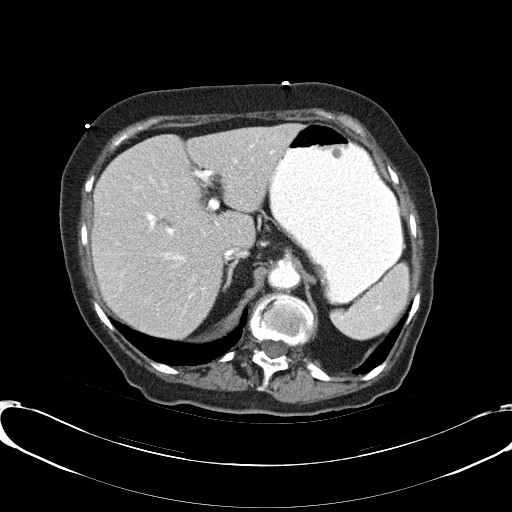
[im 74/87  soft-tissue]
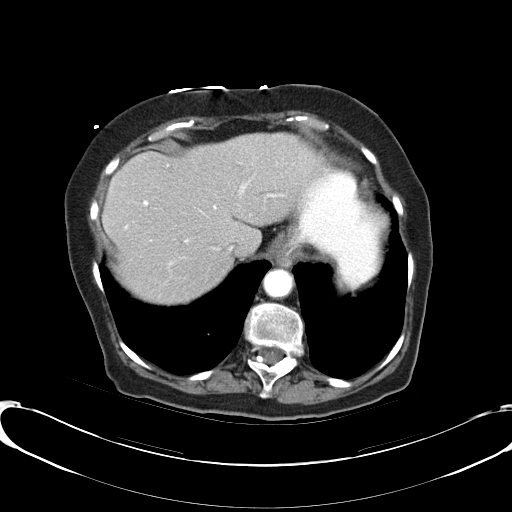
[im 82/87  soft-tissue]
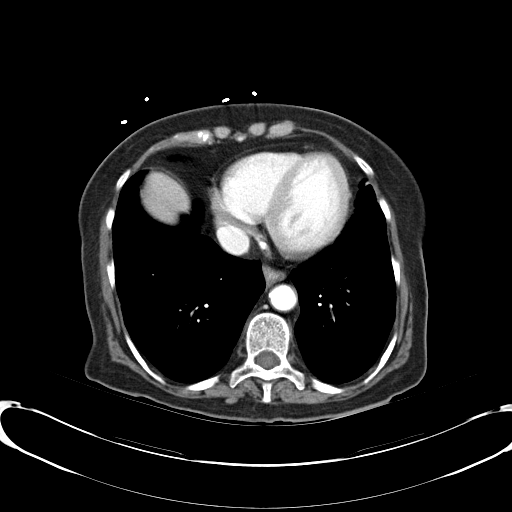

[Series 4: abd_pel_with 3.0 spo cor · coronal · 0.70mm/px · 3 of 79 slices shown]
[im 27/79  soft-tissue]
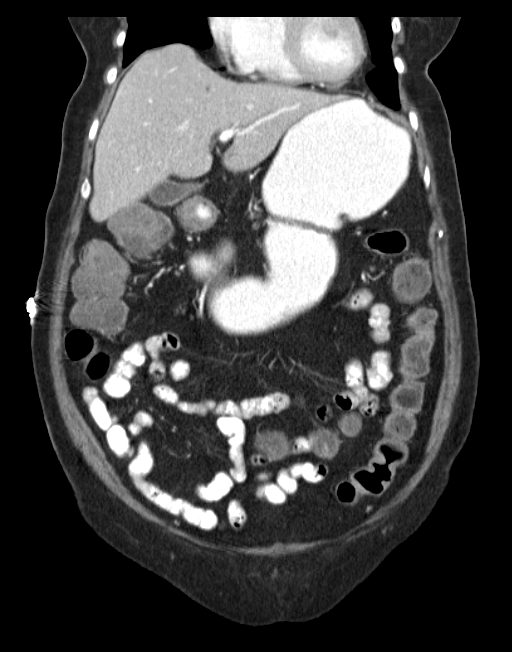
[im 35/79  soft-tissue]
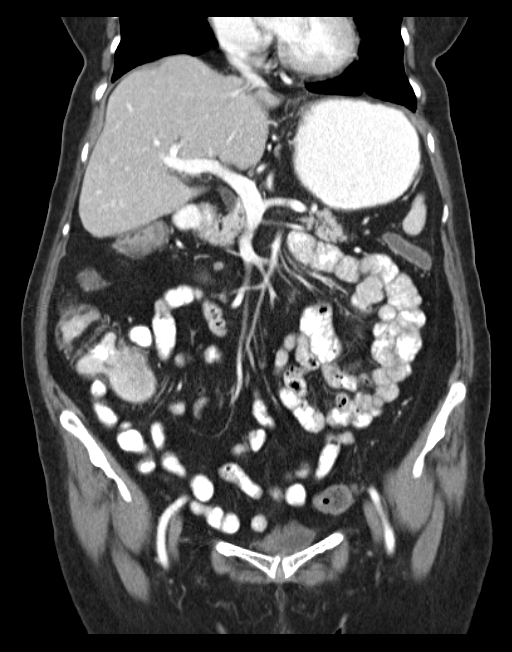
[im 44/79  soft-tissue]
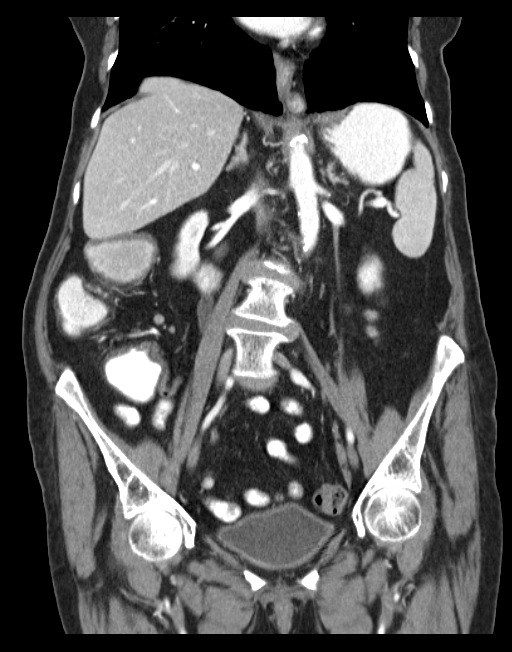

[16 of 46 positions shown; findings below may reference images not displayed]

FINDINGS: Lingular and right middle lobe scarring. Lung bases otherwise clear.
No effusions. Heart is normal size.

Tiny hypodensity in the dome of the liver, stable, likely small
cyst. Stable slight intrahepatic ductal dilatation. Borderline size
of the common bile duct and pancreatic duct. Small hypervascular
areas in the lower pole of the spleen, likely flash filling of
hemangiomas. Mild atrophy of the pancreas. No focal lesion. Adrenals
are unremarkable.

Bilateral renal cortical thinning and areas of scarring. Small cyst
in the upper pole of the right kidney. Small nonobstructing stone in
the lower pole of the right kidney. No hydronephrosis or
hydroureter. Urinary bladder is grossly unremarkable.

Again noted is bladder wall thickening along the posterior and
inferior bladder wall. This is slightly more pronounced than prior
study. Cannot exclude infiltrative bladder wall process.

Uterus and adnexa unremarkable. Small bowel is decompressed. Few
scattered colonic diverticula. No active diverticulitis. Large bowel
otherwise unremarkable. Stomach unremarkable. Small hiatal hernia.

No acute bony abnormality. Leftward scoliosis and degenerative
changes throughout the thoracolumbar spine.
IMPRESSION: Apparent increase in bladder wall thickening along the posterior and
inferior bladder walls. Cannot exclude infiltrating bladder wall
mass. Recommend clinical correlation for hematuria and possible
cystoscopy.

Stable mild biliary and pancreatic ductal dilatation of questionable
significance. Recommend correlation with laboratory values.

Right lower pole nephrolithiasis. Renal cortical thinning and
scarring.

## 2013-02-26 IMAGING — CR DG CHEST 2V
2 series · 2 of 2 positions shown · non-contrast
Comparison: [DATE].

CLINICAL DATA: Rectal bleeding.  Hypertension.

EXAM:
CHEST  2 VIEW

[view not recorded (1 of 2)]
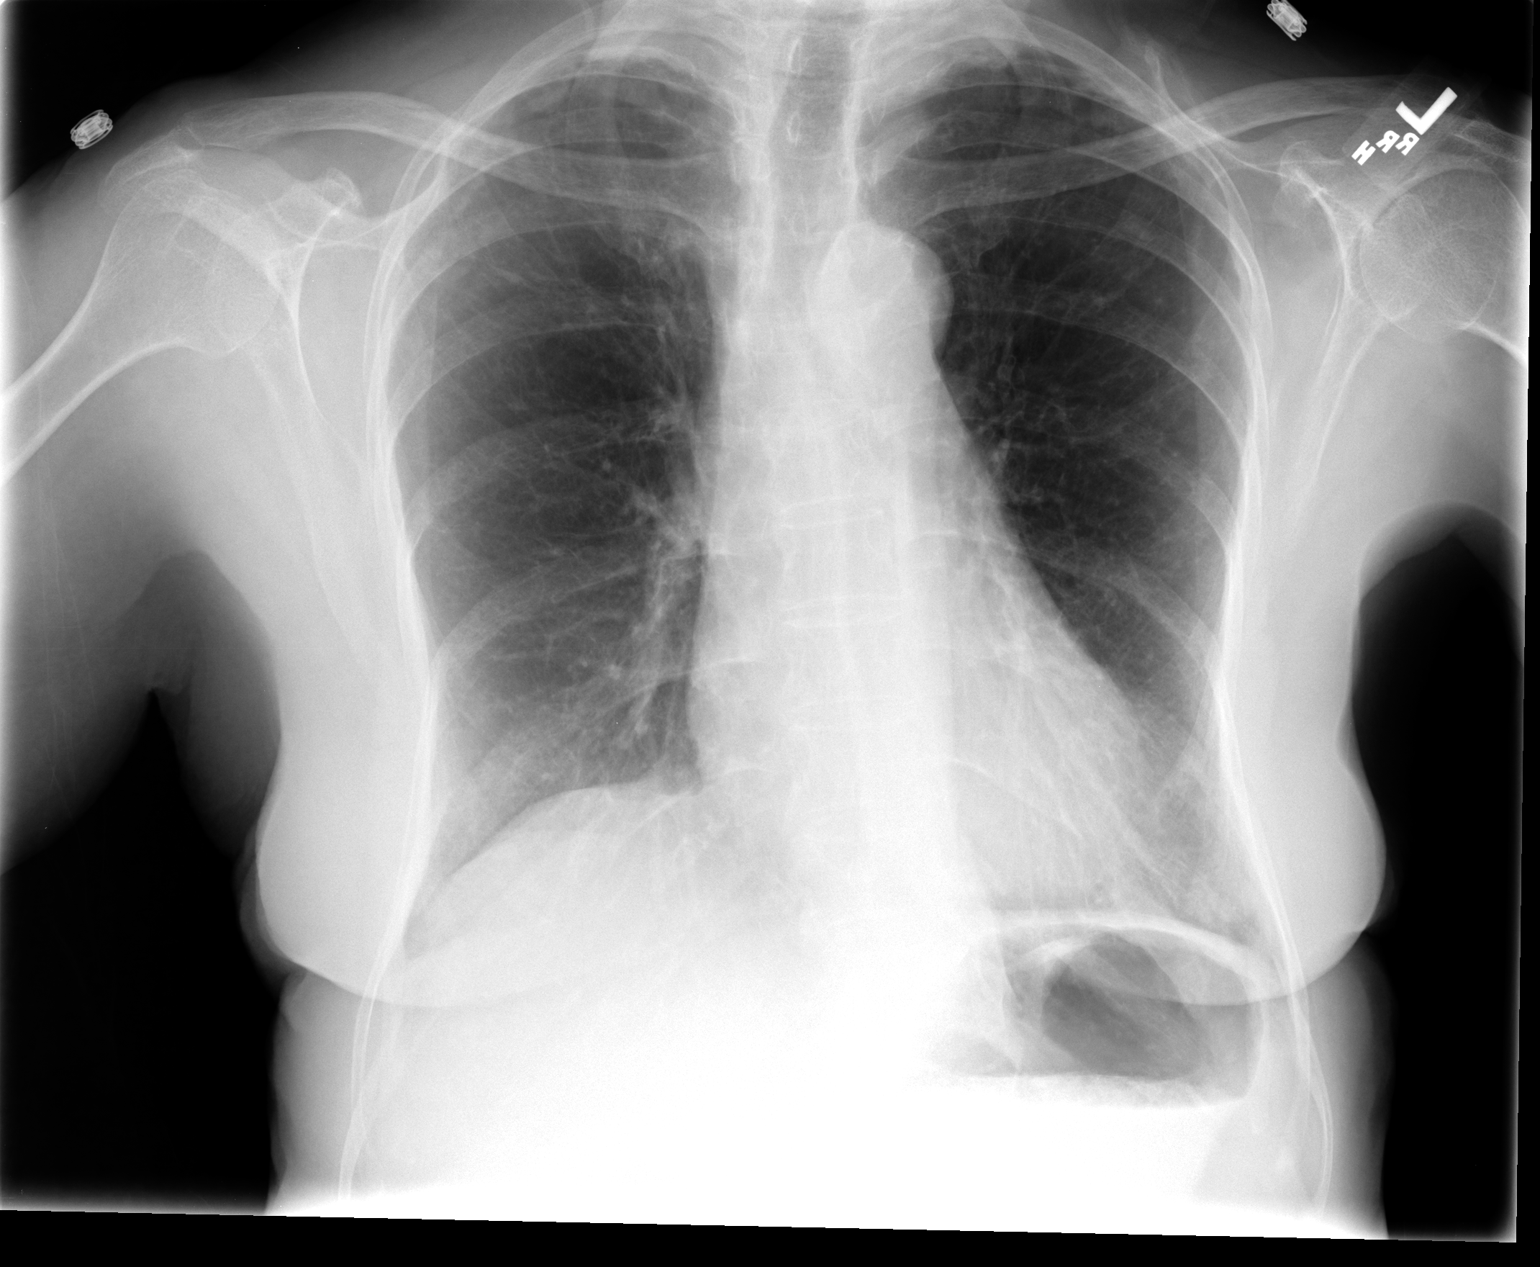

[view not recorded (2 of 2)]
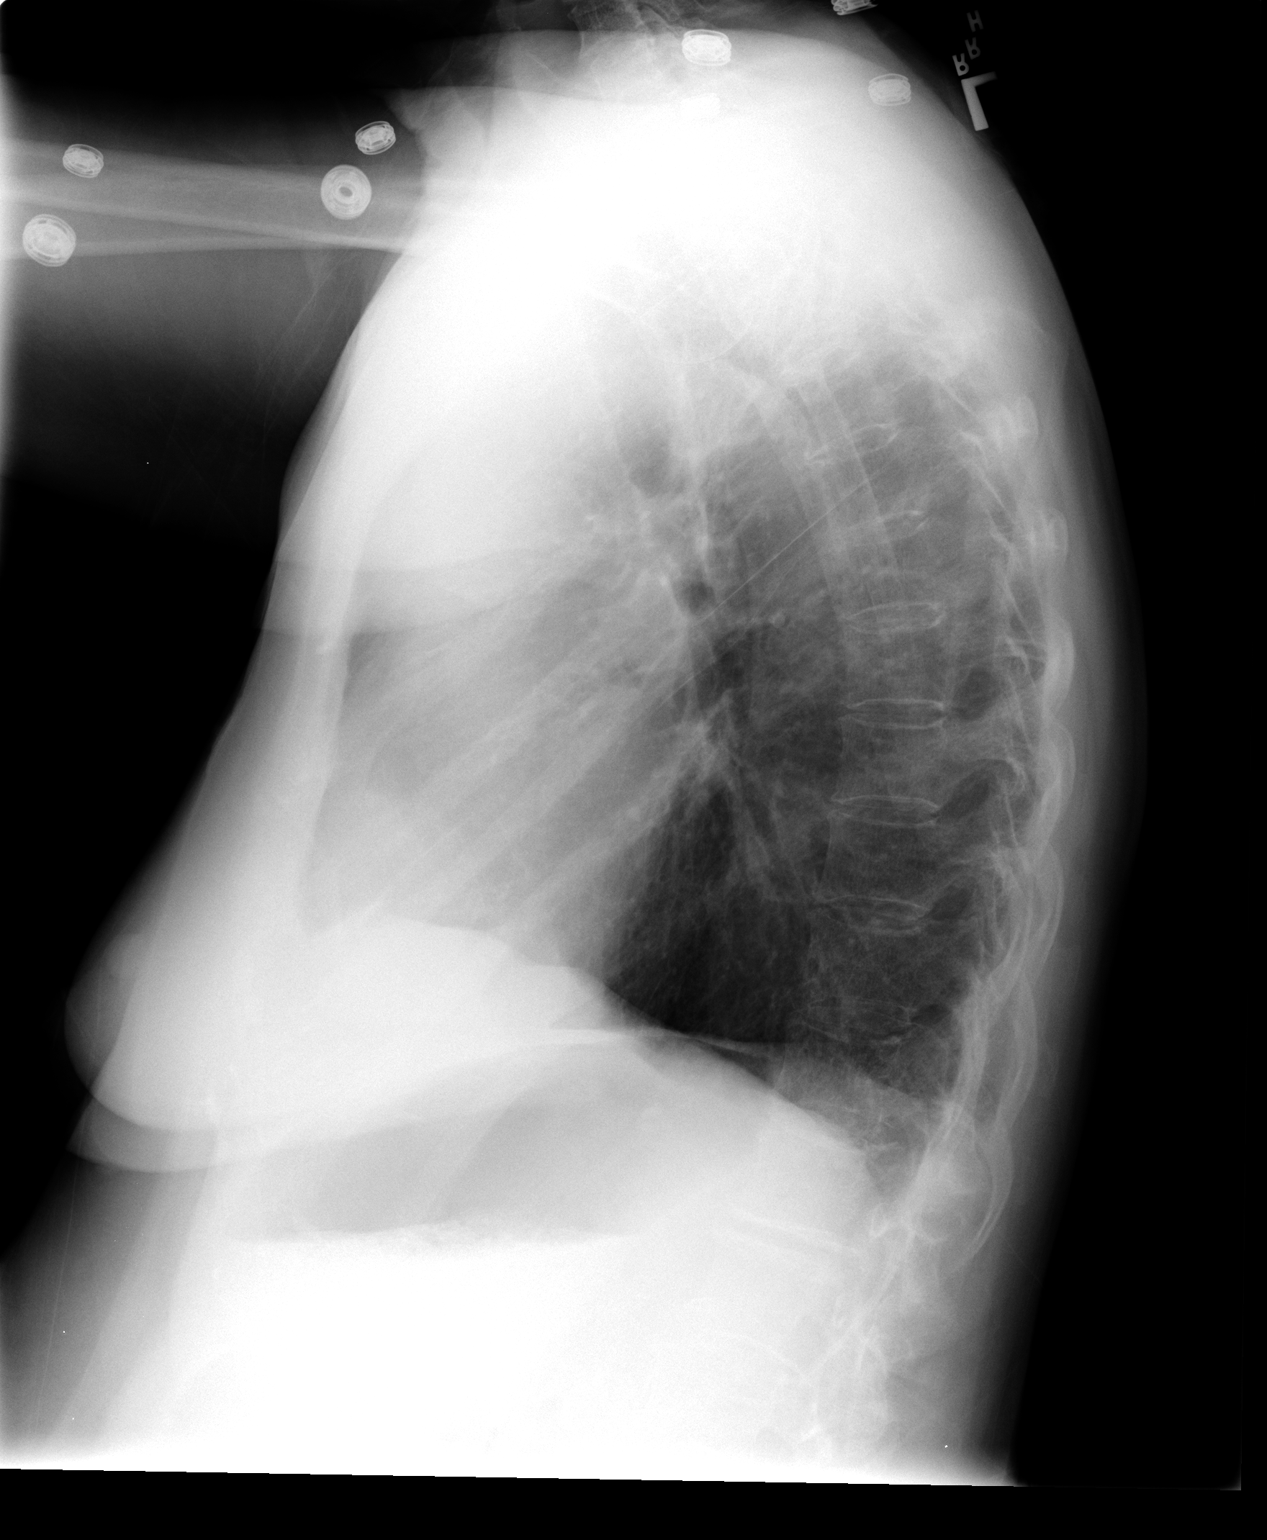

[2 of 2 positions shown; findings below may reference images not displayed]

FINDINGS: Again noted is apical pleural thickening consistent with scarring.
Lungs are clear of acute infiltrates. No pleural effusion or
pneumothorax. Heart size and pulmonary vascularity normal.
Degenerative changes both shoulders. Degenerative changes thoracic
spine.
IMPRESSION: No active cardiopulmonary disease.

## 2013-02-26 MED ORDER — SODIUM CHLORIDE 0.9 % IV SOLN
8.0000 mg/h | INTRAVENOUS | Status: AC
  Administered 2013-02-26 – 2013-03-01 (×5): 8 mg/h via INTRAVENOUS
  Filled 2013-02-26 (×15): qty 80

## 2013-02-26 MED ORDER — ONDANSETRON HCL 4 MG/2ML IJ SOLN: 4.0000 mg | Freq: Four times a day (QID) | INTRAMUSCULAR | Status: DC | PRN

## 2013-02-26 MED ORDER — DEXTROSE 5 % IV SOLN
1.0000 g | INTRAVENOUS | Status: DC
  Administered 2013-02-27 – 2013-02-28 (×2): 1 g via INTRAVENOUS
  Filled 2013-02-26 (×3): qty 10

## 2013-02-26 MED ORDER — SODIUM CHLORIDE 0.9 % IV SOLN
INTRAVENOUS | Status: DC
  Administered 2013-02-26: 15:00:00 via INTRAVENOUS

## 2013-02-26 MED ORDER — ACETAMINOPHEN 325 MG PO TABS
650.0000 mg | ORAL_TABLET | Freq: Four times a day (QID) | ORAL | Status: DC | PRN
  Administered 2013-02-26: 650 mg via ORAL
  Filled 2013-02-26: qty 2

## 2013-02-26 MED ORDER — PANTOPRAZOLE SODIUM 40 MG IV SOLR
40.0000 mg | Freq: Two times a day (BID) | INTRAVENOUS | Status: DC
  Filled 2013-02-26: qty 40

## 2013-02-26 MED ORDER — ACETAMINOPHEN 650 MG RE SUPP: 650.0000 mg | Freq: Four times a day (QID) | RECTAL | Status: DC | PRN

## 2013-02-26 MED ORDER — PANTOPRAZOLE SODIUM 40 MG IV SOLR
INTRAVENOUS | Status: AC
  Filled 2013-02-26: qty 160

## 2013-02-26 MED ORDER — DEXTROSE 5 % IV SOLN
1.0000 g | Freq: Once | INTRAVENOUS | Status: AC
  Administered 2013-02-26: 1 g via INTRAVENOUS
  Filled 2013-02-26: qty 10

## 2013-02-26 MED ORDER — SODIUM CHLORIDE 0.9 % IV SOLN
INTRAVENOUS | Status: DC
  Administered 2013-02-27 – 2013-03-01 (×4): via INTRAVENOUS

## 2013-02-26 MED ORDER — ONDANSETRON HCL 4 MG PO TABS: 4.0000 mg | ORAL_TABLET | Freq: Four times a day (QID) | ORAL | Status: DC | PRN

## 2013-02-26 MED ORDER — SODIUM CHLORIDE 0.9 % IV SOLN
80.0000 mg | Freq: Once | INTRAVENOUS | Status: AC
  Administered 2013-02-26: 80 mg via INTRAVENOUS
  Filled 2013-02-26: qty 80

## 2013-02-26 MED ORDER — IOHEXOL 300 MG/ML  SOLN
50.0000 mL | Freq: Once | INTRAMUSCULAR | Status: AC | PRN
  Administered 2013-02-26: 50 mL via ORAL

## 2013-02-26 MED ORDER — IOHEXOL 300 MG/ML  SOLN
100.0000 mL | Freq: Once | INTRAMUSCULAR | Status: AC | PRN
  Administered 2013-02-26: 100 mL via INTRAVENOUS

## 2013-02-26 NOTE — Progress Notes (Signed)
Pt arrived to room 6N31 from [facility] ED via stretcher accompanied by family. Alert and oriented x4. VSS. Oriented to room and call bell.

## 2013-02-26 NOTE — ED Notes (Signed)
AC called for protonix.

## 2013-02-26 NOTE — ED Notes (Signed)
Patient is resting comfortably. 

## 2013-02-26 NOTE — ED Notes (Signed)
Complain of weakness and rectal bleeding. Has hx of ulcers

## 2013-02-26 NOTE — H&P (Signed)
History and Physical  LAVANYA ROA ZOX:[id number] DOB: 03-09-1929 DOA: 02/26/2013  Referring physician: Dr. Clarene Duke in ED PCP: Ernestine Conrad, MD   Chief Complaint: Dark stool  HPI:  79 year old woman presented to the emergency department with a history of dark, tarry stools and dizziness. While in the emergency department she had dark stool and was fecal occult positive. She was referred for further evaluation of GI bleed. There is no GI coverage this weekend at [facility] and after discussion with family, plan was made to transfer to [facility] for further evaluation.  History obtained from the patient. She has been dizzy for the last 2 days. Approximately 4 days ago she noticed dark, tarry stool with 1-2 episodes per day. She has had 2 episodes today. When she has a stool she has some abdominal cramping, otherwise no abdominal pain. She said no vomiting. She reports a history of gastric ulcers perhaps 20-30 years ago, no history of surgery. She reports a colonoscopy by Dr. In Jonita Albee several months ago but cannot relay the findings of this. These records are not available. She denies NSAID use. No alcohol use.  In the emergency department she was noted to be afebrile with stable vital signs. Complete metabolic panel is unremarkable. Troponin was negative. Lactic acid normal. Hemoglobin 9.9. Last hemoglobin 07/2012 was 12.5. Urinalysis was grossly positive. CT of the abdomen and pelvis with no acute bowel abnormalities.  Review of Systems:  Negative for fever, visual changes, sore throat, rash, new muscle aches, chest pain, SOB, dysuria, vomiting.  Past Medical History  Diagnosis Date  . Hypertension   . Hyperlipemia   . History of stomach ulcers     1980s or 1990s  . Skin cancer   . Chronic diarrhea   . Vertigo     Past Surgical History  Procedure Laterality Date  . Nose surgery      for skin cancer    Social History:  reports that she has never smoked. She does not have any smokeless  tobacco history on file. She reports that she does not drink alcohol or use illicit drugs.  Allergies  Allergen Reactions  . Codeine Other (See Comments)    Reaction unknown  . Sulfa Antibiotics Other (See Comments)    Reactions unknown    Family History  Problem Relation Age of Onset  . Heart attack Father      Prior to Admission medications   Medication Sig Start Date End Date Taking? Authorizing Provider  acetaminophen (TYLENOL) 500 MG tablet Take 1,000 mg by mouth daily as needed for headache.   Yes Historical Provider, MD  atenolol (TENORMIN) 50 MG tablet Take 50 mg by mouth 2 (two) times daily.   Yes Historical Provider, MD  lisinopril (PRINIVIL,ZESTRIL) 2.5 MG tablet Take 2.5 mg by mouth daily.   Yes Historical Provider, MD  loperamide (IMODIUM A-D) 2 MG tablet Take 4 mg by mouth daily as needed for diarrhea or loose stools.   Yes Historical Provider, MD  meclizine (ANTIVERT) 25 MG tablet admit to medical bed.  Take 25 mg by mouth 2 (two) times daily as needed for dizziness (VERTIGO).   Yes Historical Provider, MD  omeprazole (PRILOSEC) 20 MG capsule Take 20 mg by mouth daily.    Yes Historical Provider, MD  potassium chloride (K-DUR) 10 MEQ tablet Take 10-20 mEq by mouth 2 (two) times daily. Takes two in the morning and one in the evening   Yes Historical Provider, MD  Tetrahydrozoline HCl (EYE DROPS  OP) Apply 2 drops to eye daily as needed (dry eyes).   Yes Historical Provider, MD  traMADol (ULTRAM) 50 MG tablet Take 50 mg by mouth 4 (four) times daily.   Yes Historical Provider, MD  triamterene-hydrochlorothiazide (MAXZIDE-25) 37.5-25 MG per tablet Take 1 tablet by mouth daily.   Yes Historical Provider, MD   Physical Exam: Filed Vitals:   02/26/13 1504 02/26/13 1600 02/26/13 1701 02/26/13 1710  BP: 123/72 121/46 121/55   Pulse: 67 61 63   Temp:    97.7 F (36.5 C)  TempSrc:    Oral  Resp:  17 19   Height:      Weight:      SpO2:  96% 94%    General: Examined in  the emergency department. Appears calm and comfortable, nontoxic. Eyes: PERRL, normal lids, irises  ENT: grossly normal hearing, lips & tongue. Nose and face status post surgery for previous skin cancer Neck: no LAD, masses or thyromegaly Cardiovascular: RRR, no m/r/g. No LE edema. Respiratory: CTA bilaterally, no w/r/r. Normal respiratory effort. Abdomen: soft, ntnd oh positive bowel sounds Skin: no rash or induration seen  Musculoskeletal: grossly normal tone BUE/BLE Psychiatric: grossly normal mood and affect, speech fluent and appropriate Neurologic: grossly non-focal.  Wt Readings from Last 3 Encounters:  02/26/13 48.988 kg (108 lb)  08/24/12 54.432 kg (120 lb)    Labs on Admission:  Basic Metabolic Panel:  Recent Labs Lab 02/26/13 1415  NA 138  K 3.7  CL 101  CO2 26  GLUCOSE 99  BUN 36*  CREATININE 0.95  CALCIUM 9.6    Liver Function Tests:  Recent Labs Lab 02/26/13 1415  AST 14  ALT 9  ALKPHOS 66  BILITOT 0.2*  PROT 7.4  ALBUMIN 3.7    Recent Labs Lab 02/26/13 1415  LIPASE 27   CBC:  Recent Labs Lab 02/26/13 1415  WBC 6.5  NEUTROABS 4.3  HGB 9.9*  HCT 29.7*  MCV 90.3  PLT 289    Cardiac Enzymes:  Recent Labs Lab 02/26/13 1415  TROPONINI <0.30    Radiological Exams on Admission: Dg Chest 2 View  02/26/2013   CLINICAL DATA:  Rectal bleeding.  Hypertension.  EXAM: CHEST  2 VIEW  COMPARISON:  08/24/2012.  FINDINGS: Again noted is apical pleural thickening consistent with scarring. Lungs are clear of acute infiltrates. No pleural effusion or pneumothorax. Heart size and pulmonary vascularity normal. Degenerative changes both shoulders. Degenerative changes thoracic spine.  IMPRESSION: No active cardiopulmonary disease.   Electronically Signed   By: Maisie Fus  Register   On: 02/26/2013 14:54   Ct Abdomen Pelvis W Contrast  02/26/2013   CLINICAL DATA:  Rectal bleeding, abdominal pain.  EXAM: CT ABDOMEN AND PELVIS WITH CONTRAST  TECHNIQUE:  Multidetector CT imaging of the abdomen and pelvis was performed using the standard protocol following bolus administration of intravenous contrast.  CONTRAST:  50mL OMNIPAQUE IOHEXOL 300 MG/ML SOLN, OMNIPAQUE IOHEXOL 300 MG/ML SOLN  COMPARISON:  08/24/2012  FINDINGS: Lingular and right middle lobe scarring. Lung bases otherwise clear. No effusions. Heart is normal size.  Tiny hypodensity in the dome of the liver, stable, likely small cyst. Stable slight intrahepatic ductal dilatation. Borderline size of the common bile duct and pancreatic duct. Small hypervascular areas in the lower pole of the spleen, likely flash filling of hemangiomas. Mild atrophy of the pancreas. No focal lesion. Adrenals are unremarkable.  Bilateral renal cortical thinning and areas of scarring. Small cyst in the upper pole of  the right kidney. Small nonobstructing stone in the lower pole of the right kidney. No hydronephrosis or hydroureter. Urinary bladder is grossly unremarkable.  Again noted is bladder wall thickening along the posterior and inferior bladder wall. This is slightly more pronounced than prior study. Cannot exclude infiltrative bladder wall process.  Uterus and adnexa unremarkable. Small bowel is decompressed. Few scattered colonic diverticula. No active diverticulitis. Large bowel otherwise unremarkable. Stomach unremarkable. Small hiatal hernia.  No acute bony abnormality. Leftward scoliosis and degenerative changes throughout the thoracolumbar spine.  IMPRESSION: Apparent increase in bladder wall thickening along the posterior and inferior bladder walls. Cannot exclude infiltrating bladder wall mass. Recommend clinical correlation for hematuria and possible cystoscopy.  Stable mild biliary and pancreatic ductal dilatation of questionable significance. Recommend correlation with laboratory values.  Right lower pole nephrolithiasis. Renal cortical thinning and scarring.   Electronically Signed   By: Charlett Nose M.D.    On: 02/26/2013 16:05    EKG: Independently reviewed. NSR, no acute changes   Principal Problem:   GIB (gastrointestinal bleeding) Active Problems:   Acute blood loss anemia   UTI (lower urinary tract infection)   Hypertension   Assessment/Plan 1. GIB: History of gastric ulcers and clinical history most suggestive of upper GI bleed. PPI infusion, serial CBC, GI consultation. Currently hemodynamically stable. Admit to medical bed. 2. ABLA: Suspected based on last laboratory studies from April of this year. Serial CBC. Transfuse as indicated. 3. UTI: Empiric antibiotics. Followup culture. 4. Abnormal appearance of the bladder on CT: Consider cystoscopy as an outpatient. No hematuria. 5. Hypertension: Stable.  Code Status: DNR DVT prophylaxis:SCDs Family Communication: none present Disposition Plan/Anticipated LOS: admit to [facility] team 10 Dr. Radonna Ricker. Discussed with Dr. Russella Dar who will see in consultation. Discussed with flow manager.  Time spent: 55 minutes  Brendia Sacks, MD  [facility] Pager [phone] 02/26/2013, 5:27 PM

## 2013-02-26 NOTE — Progress Notes (Signed)
Notified [facility] flow manager of pt's arrival.

## 2013-02-26 NOTE — Progress Notes (Signed)
Notified Dr Russella Dar of [facility] of pt's arrival to floor.

## 2013-02-26 NOTE — ED Notes (Signed)
Assisted pt to bedside commode, pt had episode of dark stool, female tech at bedside to assist with cleanup

## 2013-02-26 NOTE — ED Provider Notes (Signed)
CSN: [id number]     Arrival date & time 02/26/13  1152 History   First MD Initiated Contact with Patient 02/26/13 1332     Chief Complaint  Patient presents with  . Rectal Bleeding    HPI Pt was seen at 1350.  Per pt, c/o gradual onset and persistence of constant "black starry stools" for the past 4 days. Has been associated with feeling generally weak/fatigued. States she has been feeling "lightheaded" when she stands up. Denies abd pain, no N/V/D, no fevers, no back pain, no rash, no CP/SOB, no red blood in stools, no focal motor weakness, no tingling/numbness in extremities, no syncope.       Past Medical History  Diagnosis Date  . Hypertension   . Hyperlipemia   . History of stomach ulcers     1980s or 1990s  . Skin cancer   . Chronic diarrhea   . Vertigo    Past Surgical History  Procedure Laterality Date  . Nose surgery      for skin cancer   Family History  Problem Relation Age of Onset  . Heart attack Father    History  Substance Use Topics  . Smoking status: Never Smoker   . Smokeless tobacco: Not on file  . Alcohol Use: No    Review of Systems ROS: Statement: All systems negative except as marked or noted in the HPI; Constitutional: Negative for fever and chills. +generalized weakness/fatigue.; ; Eyes: Negative for eye pain, redness and discharge. ; ; ENMT: Negative for ear pain, hoarseness, nasal congestion, sinus pressure and sore throat. ; ; Cardiovascular: Negative for chest pain, palpitations, diaphoresis, dyspnea and peripheral edema. ; ; Respiratory: Negative for cough, wheezing and stridor. ; ; Gastrointestinal: +"black stools." Negative for nausea, vomiting, diarrhea, abdominal pain, blood in stool, hematemesis, jaundice and rectal bleeding. . ; ; Genitourinary: Negative for dysuria, flank pain and hematuria. ; ; Musculoskeletal: Negative for back pain and neck pain. Negative for swelling and trauma.; ; Skin: Negative for pruritus, rash, abrasions, blisters,  bruising and skin lesion.; ; Neuro: +lightheaded. Negative for headache and neck stiffness. Negative for altered level of consciousness , altered mental status, extremity weakness, paresthesias, involuntary movement, seizure and syncope.     Allergies  Codeine and Sulfa antibiotics  Home Medications   Current Outpatient Rx  Name  Route  Sig  Dispense  Refill  . acetaminophen (TYLENOL) 500 MG tablet   Oral   Take 1,000 mg by mouth daily as needed for headache.         Marland Kitchen atenolol (TENORMIN) 50 MG tablet   Oral   Take 50 mg by mouth 2 (two) times daily.         Marland Kitchen lisinopril (PRINIVIL,ZESTRIL) 2.5 MG tablet   Oral   Take 2.5 mg by mouth daily.         Marland Kitchen loperamide (IMODIUM A-D) 2 MG tablet   Oral   Take 4 mg by mouth daily as needed for diarrhea or loose stools.         . meclizine (ANTIVERT) 25 MG tablet   Oral   Take 25 mg by mouth 2 (two) times daily as needed for dizziness (VERTIGO).         Marland Kitchen omeprazole (PRILOSEC) 20 MG capsule   Oral   Take 20 mg by mouth daily.          . potassium chloride (K-DUR) 10 MEQ tablet   Oral   Take 10-20 mEq by mouth  2 (two) times daily. Takes two in the morning and one in the evening         . Tetrahydrozoline HCl (EYE DROPS OP)   Ophthalmic   Apply 2 drops to eye daily as needed (dry eyes).         . traMADol (ULTRAM) 50 MG tablet   Oral   Take 50 mg by mouth 4 (four) times daily.         Marland Kitchen triamterene-hydrochlorothiazide (MAXZIDE-25) 37.5-25 MG per tablet   Oral   Take 1 tablet by mouth daily.          BP 121/55  Pulse 66  Temp(Src) 97.7 F (36.5 C) (Oral)  Resp 16  Ht 5\' 6"  (1.676 m)  Wt 108 lb (48.988 kg)  BMI 17.44 kg/m2  SpO2 99% Physical Exam 1355: Physical examination:  Nursing notes reviewed; Vital signs and O2 SAT reviewed;  Constitutional: Well developed, Well nourished, Well hydrated, In no acute distress; Head:  Normocephalic, atraumatic; Eyes: EOMI, PERRL, No scleral icterus; ENMT: Mouth  and pharynx normal, Mucous membranes moist; Neck: Supple, Full range of motion, No lymphadenopathy; Cardiovascular: Regular rate and rhythm, No gallop; Respiratory: Breath sounds clear & equal bilaterally, No wheezes.  Speaking full sentences with ease, Normal respiratory effort/excursion; Chest: Nontender, Movement normal; Abdomen: Soft, Nontender, Nondistended, Normal bowel sounds. Rectal exam performed w/permission of pt and ED RN chaperone present.  Anal tone normal.  Non-tender, soft black tarry stool in rectal vault, heme positive.  No fissures, no external hemorrhoids, no palp masses.;;; Genitourinary: No CVA tenderness; Extremities: Pulses normal, No tenderness, No edema, No calf edema or asymmetry.; Neuro: AA&Ox3, Major CN grossly intact.  Speech clear. No gross focal motor or sensory deficits in extremities.; Skin: Color normal, Warm, Dry.   ED Course  Procedures   EKG Interpretation    Date/Time:  Friday February 26 2013 14:58:49 EST Ventricular Rate:  61 PR Interval:  162 QRS Duration: 92 QT Interval:  408 QTC Calculation: 410 R Axis:   -28 Text Interpretation:  Normal sinus rhythm Moderate voltage criteria for LVH, may be normal variant Borderline ECG When compared with ECG of 24-Aug-2012 17:07, No significant change was found Confirmed by [facility]  MD, Nicholos Johns [phone]) on 02/26/2013 4:05:16 PM            MDM  MDM Reviewed: previous chart, nursing note and vitals Reviewed previous: labs and ECG Interpretation: labs, ECG, x-ray and CT scan     Results for orders placed during the hospital encounter of 02/26/13  CLOSTRIDIUM DIFFICILE BY PCR      Result Value Range   C difficile by pcr NEGATIVE  NEGATIVE  URINALYSIS W MICROSCOPIC + REFLEX CULTURE      Result Value Range   Color, Urine YELLOW  YELLOW   APPearance CLOUDY (*) CLEAR   Specific Gravity, Urine 1.020  1.005 - 1.030   pH 6.0  5.0 - 8.0   Glucose, UA NEGATIVE  NEGATIVE mg/dL   Hgb urine dipstick TRACE (*)  NEGATIVE   Bilirubin Urine NEGATIVE  NEGATIVE   Ketones, ur NEGATIVE  NEGATIVE mg/dL   Protein, ur NEGATIVE  NEGATIVE mg/dL   Urobilinogen, UA 0.2  0.0 - 1.0 mg/dL   Nitrite POSITIVE (*) NEGATIVE   Leukocytes, UA MODERATE (*) NEGATIVE   WBC, UA TOO NUMEROUS TO COUNT  <3 WBC/hpf   Bacteria, UA MANY (*) RARE   Squamous Epithelial / LPF FEW (*) RARE  CBC WITH DIFFERENTIAL  Result Value Range   WBC 6.5  4.0 - 10.5 K/uL   RBC 3.29 (*) 3.87 - 5.11 MIL/uL   Hemoglobin 9.9 (*) 12.0 - 15.0 g/dL   HCT 24.4 (*) 01.0 - 27.2 %   MCV 90.3  78.0 - 100.0 fL   MCH 30.1  26.0 - 34.0 pg   MCHC 33.3  30.0 - 36.0 g/dL   RDW 53.6  64.4 - 03.4 %   Platelets 289  150 - 400 K/uL   Neutrophils Relative % 66  43 - 77 %   Neutro Abs 4.3  1.7 - 7.7 K/uL   Lymphocytes Relative 27  12 - 46 %   Lymphs Abs 1.7  0.7 - 4.0 K/uL   Monocytes Relative 5  3 - 12 %   Monocytes Absolute 0.3  0.1 - 1.0 K/uL   Eosinophils Relative 2  0 - 5 %   Eosinophils Absolute 0.2  0.0 - 0.7 K/uL   Basophils Relative 0  0 - 1 %   Basophils Absolute 0.0  0.0 - 0.1 K/uL  COMPREHENSIVE METABOLIC PANEL      Result Value Range   Sodium 138  135 - 145 mEq/L   Potassium 3.7  3.5 - 5.1 mEq/L   Chloride 101  96 - 112 mEq/L   CO2 26  19 - 32 mEq/L   Glucose, Bld 99  70 - 99 mg/dL   BUN 36 (*) 6 - 23 mg/dL   Creatinine, Ser 7.42  0.50 - 1.10 mg/dL   Calcium 9.6  8.4 - 59.5 mg/dL   Total Protein 7.4  6.0 - 8.3 g/dL   Albumin 3.7  3.5 - 5.2 g/dL   AST 14  0 - 37 U/L   ALT 9  0 - 35 U/L   Alkaline Phosphatase 66  39 - 117 U/L   Total Bilirubin 0.2 (*) 0.3 - 1.2 mg/dL   GFR calc non Af Amer 54 (*) >90 mL/min   GFR calc Af Amer 63 (*) >90 mL/min  LIPASE, BLOOD      Result Value Range   Lipase 27  11 - 59 U/L  LACTIC ACID, PLASMA      Result Value Range   Lactic Acid, Venous 1.1  0.5 - 2.2 mmol/L  TROPONIN I      Result Value Range   Troponin I <0.30  <0.30 ng/mL  OCCULT BLOOD, POC DEVICE      Result Value Range   Fecal  Occult Bld POSITIVE (*) NEGATIVE   Dg Chest 2 View 02/26/2013   CLINICAL DATA:  Rectal bleeding.  Hypertension.  EXAM: CHEST  2 VIEW  COMPARISON:  08/24/2012.  FINDINGS: Again noted is apical pleural thickening consistent with scarring. Lungs are clear of acute infiltrates. No pleural effusion or pneumothorax. Heart size and pulmonary vascularity normal. Degenerative changes both shoulders. Degenerative changes thoracic spine.  IMPRESSION: No active cardiopulmonary disease.   Electronically Signed   By: Maisie Fus  Register   On: 02/26/2013 14:54   Ct Abdomen Pelvis W Contrast 02/26/2013   CLINICAL DATA:  Rectal bleeding, abdominal pain.  EXAM: CT ABDOMEN AND PELVIS WITH CONTRAST  TECHNIQUE: Multidetector CT imaging of the abdomen and pelvis was performed using the standard protocol following bolus administration of intravenous contrast.  CONTRAST:  50mL OMNIPAQUE IOHEXOL 300 MG/ML SOLN, OMNIPAQUE IOHEXOL 300 MG/ML SOLN  COMPARISON:  08/24/2012  FINDINGS: Lingular and right middle lobe scarring. Lung bases otherwise clear. No effusions. Heart is normal size.  Tiny hypodensity in the dome of the liver, stable, likely small cyst. Stable slight intrahepatic ductal dilatation. Borderline size of the common bile duct and pancreatic duct. Small hypervascular areas in the lower pole of the spleen, likely flash filling of hemangiomas. Mild atrophy of the pancreas. No focal lesion. Adrenals are unremarkable.  Bilateral renal cortical thinning and areas of scarring. Small cyst in the upper pole of the right kidney. Small nonobstructing stone in the lower pole of the right kidney. No hydronephrosis or hydroureter. Urinary bladder is grossly unremarkable.  Again noted is bladder wall thickening along the posterior and inferior bladder wall. This is slightly more pronounced than prior study. Cannot exclude infiltrative bladder wall process.  Uterus and adnexa unremarkable. Small bowel is decompressed. Few scattered  colonic diverticula. No active diverticulitis. Large bowel otherwise unremarkable. Stomach unremarkable. Small hiatal hernia.  No acute bony abnormality. Leftward scoliosis and degenerative changes throughout the thoracolumbar spine.  IMPRESSION: Apparent increase in bladder wall thickening along the posterior and inferior bladder walls. Cannot exclude infiltrating bladder wall mass. Recommend clinical correlation for hematuria and possible cystoscopy.  Stable mild biliary and pancreatic ductal dilatation of questionable significance. Recommend correlation with laboratory values.  Right lower pole nephrolithiasis. Renal cortical thinning and scarring.   Electronically Signed   By: Charlett Nose M.D.   On: 02/26/2013 16:05   Results for Renee Blake, Renee Blake (MRN [id number]) as of 02/26/2013 17:57  Ref. Range 08/24/2012 16:47 02/26/2013 14:15  Hemoglobin Latest Range: 12.0-15.0 g/dL 91.4 9.9 (L)  HCT Latest Range: 36.0-46.0 % 37.2 29.7 (L)     1705:  +UTI, UC pending; IV rocephin given. Pt states she felt "lightheaded" when stood for orthostatic VS. H/H lower than previous. Pt has passed several dark stools while in the ED; sample collected for cdiff and GI pathogen panel.   T/C to [facility] Dr. Irene Limbo, case discussed, including:  HPI, pertinent PM/SHx, VS/PE, dx testing, ED course and treatment:  States pt will have to be transferred to [facility] due to [facility] not having GI coverage this weekend, requests to obtain medical bed to Dr. Robb Matar' service.     Laray Anger, DO 03/01/13 Rich Fuchs

## 2013-02-27 ENCOUNTER — Encounter (HOSPITAL_COMMUNITY): Payer: Self-pay | Admitting: Gastroenterology

## 2013-02-27 ENCOUNTER — Encounter (HOSPITAL_COMMUNITY)
Admission: EM | Disposition: A | Discharge status: Home / Self Care | Payer: Self-pay | Source: Home / Self Care | Attending: Internal Medicine

## 2013-02-27 DIAGNOSIS — K260 Acute duodenal ulcer with hemorrhage: Secondary | ICD-10-CM

## 2013-02-27 DIAGNOSIS — K922 Gastrointestinal hemorrhage, unspecified: Secondary | ICD-10-CM

## 2013-02-27 DIAGNOSIS — D62 Acute posthemorrhagic anemia: Secondary | ICD-10-CM

## 2013-02-27 HISTORY — PX: HOT HEMOSTASIS: SHX5433

## 2013-02-27 HISTORY — PX: ESOPHAGOGASTRODUODENOSCOPY: SHX5428

## 2013-02-27 LAB — CBC
HCT: 22 % — ABNORMAL LOW (ref 36.0–46.0)
HCT: 22.4 % — ABNORMAL LOW (ref 36.0–46.0)
HCT: 22.6 % — ABNORMAL LOW (ref 36.0–46.0)
HCT: 24.5 % — ABNORMAL LOW (ref 36.0–46.0)
Hemoglobin: 7.6 g/dL — ABNORMAL LOW (ref 12.0–15.0)
Hemoglobin: 7.6 g/dL — ABNORMAL LOW (ref 12.0–15.0)
Hemoglobin: 7.7 g/dL — ABNORMAL LOW (ref 12.0–15.0)
Hemoglobin: 8.3 g/dL — ABNORMAL LOW (ref 12.0–15.0)
MCH: 30.1 pg (ref 26.0–34.0)
MCH: 30.2 pg (ref 26.0–34.0)
MCH: 30.3 pg (ref 26.0–34.0)
MCH: 30.5 pg (ref 26.0–34.0)
MCHC: 33.9 g/dL (ref 30.0–36.0)
MCHC: 33.9 g/dL (ref 30.0–36.0)
MCHC: 34.1 g/dL (ref 30.0–36.0)
MCHC: 34.5 g/dL (ref 30.0–36.0)
MCV: 88.3 fL (ref 78.0–100.0)
MCV: 88.4 fL (ref 78.0–100.0)
MCV: 89.1 fL (ref 78.0–100.0)
MCV: 89.2 fL (ref 78.0–100.0)
Platelets: 205 10*3/uL (ref 150–400)
Platelets: 216 10*3/uL (ref 150–400)
Platelets: 220 10*3/uL (ref 150–400)
Platelets: 238 10*3/uL (ref 150–400)
RBC: 2.49 MIL/uL — ABNORMAL LOW (ref 3.87–5.11)
RBC: 2.51 MIL/uL — ABNORMAL LOW (ref 3.87–5.11)
RBC: 2.56 MIL/uL — ABNORMAL LOW (ref 3.87–5.11)
RBC: 2.75 MIL/uL — ABNORMAL LOW (ref 3.87–5.11)
RDW: 13.6 % (ref 11.5–15.5)
RDW: 13.6 % (ref 11.5–15.5)
RDW: 13.7 % (ref 11.5–15.5)
RDW: 13.7 % (ref 11.5–15.5)
WBC: 4.4 10*3/uL (ref 4.0–10.5)
WBC: 5.4 10*3/uL (ref 4.0–10.5)
WBC: 6.5 10*3/uL (ref 4.0–10.5)
WBC: 7 10*3/uL (ref 4.0–10.5)

## 2013-02-27 LAB — BASIC METABOLIC PANEL
BUN: 29 mg/dL — ABNORMAL HIGH (ref 6–23)
CO2: 21 mEq/L (ref 19–32)
Calcium: 8.5 mg/dL (ref 8.4–10.5)
Chloride: 110 mEq/L (ref 96–112)
Creatinine, Ser: 0.87 mg/dL (ref 0.50–1.10)
GFR calc Af Amer: 70 mL/min — ABNORMAL LOW (ref >90)
GFR calc non Af Amer: 60 mL/min — ABNORMAL LOW (ref >90)
Glucose, Bld: 89 mg/dL (ref 70–99)
Potassium: 3.6 mEq/L (ref 3.5–5.1)
Sodium: 140 mEq/L (ref 135–145)

## 2013-02-27 LAB — ABO/RH: ABO/RH(D): O NEG

## 2013-02-27 LAB — PREPARE RBC (CROSSMATCH)

## 2013-02-27 SURGERY — EGD (ESOPHAGOGASTRODUODENOSCOPY)
Anesthesia: Moderate Sedation

## 2013-02-27 MED ORDER — MIDAZOLAM HCL 10 MG/2ML IJ SOLN
INTRAMUSCULAR | Status: DC | PRN
  Administered 2013-02-27: 1 mg via INTRAVENOUS
  Administered 2013-02-27: 2 mg via INTRAVENOUS
  Administered 2013-02-27: 1 mg via INTRAVENOUS

## 2013-02-27 MED ORDER — BUTAMBEN-TETRACAINE-BENZOCAINE 2-2-14 % EX AERO
INHALATION_SPRAY | CUTANEOUS | Status: DC | PRN
  Administered 2013-02-27: 1 via TOPICAL

## 2013-02-27 MED ORDER — FENTANYL CITRATE 0.05 MG/ML IJ SOLN
INTRAMUSCULAR | Status: DC | PRN
  Administered 2013-02-27 (×2): 25 ug via INTRAVENOUS

## 2013-02-27 MED ORDER — SODIUM CHLORIDE 0.9 % IJ SOLN
PREFILLED_SYRINGE | INTRAMUSCULAR | Status: DC | PRN
  Administered 2013-02-27: 11:00:00

## 2013-02-27 MED ORDER — TRAMADOL HCL 50 MG PO TABS
50.0000 mg | ORAL_TABLET | Freq: Four times a day (QID) | ORAL | Status: DC | PRN
  Administered 2013-02-27 – 2013-03-02 (×5): 50 mg via ORAL
  Filled 2013-02-27 (×5): qty 1

## 2013-02-27 MED ORDER — EPINEPHRINE HCL 0.1 MG/ML IJ SOSY
PREFILLED_SYRINGE | INTRAMUSCULAR | Status: AC
  Filled 2013-02-27: qty 10

## 2013-02-27 MED ORDER — SODIUM CHLORIDE 0.9 % IV SOLN: INTRAVENOUS | Status: DC

## 2013-02-27 MED ORDER — MIDAZOLAM HCL 5 MG/ML IJ SOLN
INTRAMUSCULAR | Status: AC
  Filled 2013-02-27: qty 1

## 2013-02-27 MED ORDER — FENTANYL CITRATE 0.05 MG/ML IJ SOLN
INTRAMUSCULAR | Status: AC
  Filled 2013-02-27: qty 2

## 2013-02-27 MED ORDER — INFLUENZA VAC SPLIT QUAD 0.5 ML IM SUSP
0.5000 mL | INTRAMUSCULAR | Status: AC
  Administered 2013-02-28: 0.5 mL via INTRAMUSCULAR
  Filled 2013-02-27: qty 0.5

## 2013-02-27 MED ORDER — SODIUM CHLORIDE 0.9 % IV BOLUS (SEPSIS)
500.0000 mL | Freq: Once | INTRAVENOUS | Status: AC
  Administered 2013-02-27: 500 mL via INTRAVENOUS

## 2013-02-27 NOTE — Interval H&P Note (Signed)
History and Physical Interval Note:  02/27/2013 10:03 AM  Renee Blake  has presented today for surgery, with the diagnosis of UGIB  The various methods of treatment have been discussed with the patient and family. After consideration of risks, benefits and other options for treatment, the patient has consented to  Procedure(s): ESOPHAGOGASTRODUODENOSCOPY (EGD) (N/A) as a surgical intervention .  The patient's history has been reviewed, patient examined, no change in status, stable for surgery.  I have reviewed the patient's chart and labs.  Questions were answered to the patient's satisfaction.     Venita Lick. Russella Dar MD Clementeen Graham

## 2013-02-27 NOTE — Op Note (Signed)
[facility] [address] [place], [address]   ENDOSCOPY PROCEDURE REPORT  PATIENT: Renee, Blake  MR#: [id number] BIRTHDATE: [birth year] , 84  yrs. old GENDER: Female ENDOSCOPIST: Meryl Dare, MD, [facility] REFERRED BY:  [facility] PROCEDURE DATE:  02/27/2013 PROCEDURE:  EGD w/ control of bleeding ASA CLASS:     Class III INDICATIONS:  Melena.   Acute post hemorrhagic anemia. MEDICATIONS: medications were titrated to patient response per physician's verbal order, Fentanyl 50 mcg IV, and Versed 4 mg IV TOPICAL ANESTHETIC: Cetacaine Spray DESCRIPTION OF PROCEDURE: After the risks benefits and alternatives of the procedure were thoroughly explained, informed consent was obtained.  The Pentax Gastroscope [id number] endoscope was introduced through the mouth and advanced to the second portion of the duodenum. Without limitations.  The instrument was slowly withdrawn as the mucosa was fully examined.  DUODENUM: A single round ulcer measuring 7 x 5 mm in size with a visible vessel and active oozing of blood was found in the duodenal bulb. Epi injectio with, 4 cc of 1:10,000 with partial hemostasis. Bipolar (BICAP) cautery with a 7Fr probe was applied to the site using 20 watts power. Moderate pressure was applied to the cautery site with complete hemostasis achieved. An acquired stenosis with deformity was found in the duodenal bulb at the ulcer site which made visualization and treatment more difficult. The duodenal mucosa showed no abnormalities in the 2nd part of the duodenum. STOMACH: The mucosa and folds of the stomach appeared normal. ESOPHAGUS: A subtle stricture was found at the gastroesophageal junction.  The stenosis was traversable with the endoscope.   The esophagus was otherwise normal.  Retroflexed views revealed a small hiatal hernia.     The scope was then withdrawn from the patient and the procedure completed. COMPLICATIONS: There  were no complications.  ENDOSCOPIC IMPRESSION: 1.   Duodenal ulcer, actively bleeding; Bipolar (BICAP) cautery and epi injection; complete hemostasis achieved 2.   Duodenal bulb stenosis and deformity 3.   Stricture at the gastroesophageal junction 4.   Small hiatal hernia  RECOMMENDATIONS: 1.  Avoid ASA/NSAIDS long term. ASA 81 mg ok to resume if needed in 4 weeks but would avoid all other ASA/NSAIDs. 2.  PPI gtt x 72 hrs, then PPI po bid for 8 week, then PPI po daily long term 3.  Follow-up of Helicobacter pylori Ab status, treat if positive  eSigned:  Meryl Dare, MD, [facility] 02/27/2013 10:53 AM

## 2013-02-27 NOTE — H&P (View-Only) (Signed)
Referring Provider: No ref. provider found Primary Care Physician:  Ernestine Conrad, MD Primary Gastroenterologist:  Gentry Fitz; has GI MD in [facility]  Reason for Consultation:  GIB; black stools  HPI: Renee Blake is a 79 y.o. female presented to the emergency department at [facility] with a history of dark, tarry stools and dizziness/weakness for the past one week. While in the emergency department she had dark stool and was fecal occult positive.  There was no GI coverage this weekend at [facility] and after discussion with family, plan was made to transfer to [facility] for further evaluation.   History obtained from the patient. She has been dizzy for the last 2 days. Approximately one week ago she noticed dark, tarry stool with 2-3 episodes per day.  She had some abdominal discomfort since the black stools began, but no pain per se. She said no vomiting, but has nausea. She reports a history of gastric ulcers perhaps 20-30 years ago, no history of surgery.  She reports a colonoscopy by Dr. In [place] a few months ago but cannot relay the findings of this. These records are not available. She admits to using Goodie powders a couple of times a month for headaches. No alcohol use.  Says that she has lost about 40 pounds in the past year.  Takes daily prilosec at home.     In the emergency department she was noted to be afebrile with stable vital signs. Complete metabolic panel is unremarkable. Troponin was negative. Lactic acid normal. Hemoglobin 9.9. Last hemoglobin 07/2012 was 12.5. Urinalysis was grossly positive. CT of the abdomen and pelvis with no acute abnormalities.  Hgb this AM is 7.7 grams.  She is on a PPI gtt.    Past Medical History  Diagnosis Date  . Hypertension   . Hyperlipemia   . History of stomach ulcers     1980s or 1990s  . Skin cancer   . Chronic diarrhea   . Vertigo     Past Surgical History  Procedure Laterality Date  . Nose surgery      for skin cancer    Prior to  Admission medications   Medication Sig Start Date End Date Taking? Authorizing Provider  acetaminophen (TYLENOL) 500 MG tablet Take 1,000 mg by mouth daily as needed for headache.   Yes Historical Provider, MD  atenolol (TENORMIN) 50 MG tablet Take 50 mg by mouth 2 (two) times daily.   Yes Historical Provider, MD  lisinopril (PRINIVIL,ZESTRIL) 2.5 MG tablet Take 2.5 mg by mouth daily.   Yes Historical Provider, MD  loperamide (IMODIUM A-D) 2 MG tablet Take 4 mg by mouth daily as needed for diarrhea or loose stools.   Yes Historical Provider, MD  meclizine (ANTIVERT) 25 MG tablet Take 25 mg by mouth 2 (two) times daily as needed for dizziness (VERTIGO).   Yes Historical Provider, MD  omeprazole (PRILOSEC) 20 MG capsule Take 20 mg by mouth daily.    Yes Historical Provider, MD  potassium chloride (K-DUR) 10 MEQ tablet Take 10-20 mEq by mouth 2 (two) times daily. Takes two in the morning and one in the evening   Yes Historical Provider, MD  Tetrahydrozoline HCl (EYE DROPS OP) Apply 2 drops to eye daily as needed (dry eyes).   Yes Historical Provider, MD  traMADol (ULTRAM) 50 MG tablet Take 50 mg by mouth 4 (four) times daily.   Yes Historical Provider, MD  triamterene-hydrochlorothiazide (MAXZIDE-25) 37.5-25 MG per tablet Take 1 tablet by mouth  daily.   Yes Historical Provider, MD    Current Facility-Administered Medications  Medication Dose Route Frequency Provider Last Rate Last Dose  . 0.9 %  sodium chloride infusion   Intravenous Continuous Standley Brooking, MD 75 mL/hr at 02/27/13 0526    . acetaminophen (TYLENOL) tablet 650 mg  650 mg Oral Q6H PRN Standley Brooking, MD   650 mg at 02/26/13 2305   Or  . acetaminophen (TYLENOL) suppository 650 mg  650 mg Rectal Q6H PRN Standley Brooking, MD      . cefTRIAXone (ROCEPHIN) 1 g in dextrose 5 % 50 mL IVPB  1 g Intravenous Q24H Standley Brooking, MD      . Melene Muller ON 02/28/2013] influenza vac split quadrivalent PF (FLUARIX) injection 0.5 mL  0.5 mL  Intramuscular Tomorrow-1000 Kathlen Mody, MD      . ondansetron (ZOFRAN) tablet 4 mg  4 mg Oral Q6H PRN Standley Brooking, MD       Or  . ondansetron [facility]) injection 4 mg  4 mg Intravenous Q6H PRN Standley Brooking, MD      . pantoprazole (PROTONIX) 80 mg in sodium chloride 0.9 % 250 mL infusion  8 mg/hr Intravenous Continuous Standley Brooking, MD 25 mL/hr at 02/27/13 0525 8 mg/hr at 02/27/13 0525  . [START ON 03/02/2013] pantoprazole (PROTONIX) injection 40 mg  40 mg Intravenous Q12H Standley Brooking, MD        Allergies as of 02/26/2013 - Review Complete 02/26/2013  Allergen Reaction Noted  . Codeine Other (See Comments) 08/24/2012  . Sulfa antibiotics Other (See Comments) 08/24/2012    Family History  Problem Relation Age of Onset  . Heart attack Father     History   Social History  . Marital Status: Legally Separated    Spouse Name: N/A    Number of Children: N/A  . Years of Education: N/A   Occupational History  . Not on file.   Social History Main Topics  . Smoking status: Never Smoker   . Smokeless tobacco: Not on file  . Alcohol Use: No  . Drug Use: No  . Sexual Activity: Not on file   Other Topics Concern  . Not on file   Social History Narrative  . No narrative on file    Review of Systems: Ten point ROS is O/W negative except as mentioned in HPI.  Physical Exam: Vital signs in last 24 hours: Temp:  [97.4 F (36.3 C)-98.4 F (36.9 C)] 98.4 F (36.9 C) (11/29 0528) Pulse Rate:  [61-68] 66 (11/29 0528) Resp:  [15-20] 17 (11/29 0528) BP: (108-143)/(46-72) 108/47 mmHg (11/29 0528) SpO2:  [94 %-100 %] 97 % (11/29 0528) Weight:  [108 lb (48.988 kg)] 108 lb (48.988 kg) (11/28 1159) Last BM Date: 02/26/13 General:  Alert, thin, pleasant and cooperative in NAD Head:  Normocephalic and atraumatic. Eyes:  Sclera clear, no icterus.  Conjunctiva pink. Ears:  Normal auditory acuity. Mouth:  No deformity or lesions.   Lungs:  Clear throughout to  auscultation.  No wheezes, crackles, or rhonchi.  Heart:  Regular rate and rhythm; no murmurs, clicks, rubs,  or gallops. Abdomen:  Soft, non-distended.  BS present.  Non-tender.  Rectal:  Deferred.  Heme positive.  Msk:  Symmetrical without gross deformities. Pulses:  Normal pulses noted. Extremities:  Without clubbing or edema. Neurologic:  Alert and  oriented x4;  grossly normal neurologically. Skin:  Intact without significant lesions or rashes. Psych:  Alert and  cooperative. Normal mood and affect.  Intake/Output from previous day: 11/28 0701 - 11/29 0700 In: 722 [I.V.:722] Out: 208 [Urine:208]  Lab Results:  Recent Labs  02/26/13 1415 02/26/13 2350 02/27/13 0445  WBC 6.5 7.0 5.4  HGB 9.9* 8.3* 7.7*  HCT 29.7* 24.5* 22.6*  PLT 289 238 220   BMET  Recent Labs  02/26/13 1415  NA 138  K 3.7  CL 101  CO2 26  GLUCOSE 99  BUN 36*  CREATININE 0.95  CALCIUM 9.6   LFT  Recent Labs  02/26/13 1415  PROT 7.4  ALBUMIN 3.7  AST 14  ALT 9  ALKPHOS 66  BILITOT 0.2*   Studies/Results: Dg Chest 2 View  02/26/2013   CLINICAL DATA:  Rectal bleeding.  Hypertension.  EXAM: CHEST  2 VIEW  COMPARISON:  08/24/2012.  FINDINGS: Again noted is apical pleural thickening consistent with scarring. Lungs are clear of acute infiltrates. No pleural effusion or pneumothorax. Heart size and pulmonary vascularity normal. Degenerative changes both shoulders. Degenerative changes thoracic spine.  IMPRESSION: No active cardiopulmonary disease.   Electronically Signed   By: Maisie Fus  Register   On: 02/26/2013 14:54   Ct Abdomen Pelvis W Contrast  02/26/2013   CLINICAL DATA:  Rectal bleeding, abdominal pain.  EXAM: CT ABDOMEN AND PELVIS WITH CONTRAST  TECHNIQUE: Multidetector CT imaging of the abdomen and pelvis was performed using the standard protocol following bolus administration of intravenous contrast.  CONTRAST:  50mL OMNIPAQUE IOHEXOL 300 MG/ML SOLN, OMNIPAQUE IOHEXOL 300 MG/ML  SOLN  COMPARISON:  08/24/2012  FINDINGS: Lingular and right middle lobe scarring. Lung bases otherwise clear. No effusions. Heart is normal size.  Tiny hypodensity in the dome of the liver, stable, likely small cyst. Stable slight intrahepatic ductal dilatation. Borderline size of the common bile duct and pancreatic duct. Small hypervascular areas in the lower pole of the spleen, likely flash filling of hemangiomas. Mild atrophy of the pancreas. No focal lesion. Adrenals are unremarkable.  Bilateral renal cortical thinning and areas of scarring. Small cyst in the upper pole of the right kidney. Small nonobstructing stone in the lower pole of the right kidney. No hydronephrosis or hydroureter. Urinary bladder is grossly unremarkable.  Again noted is bladder wall thickening along the posterior and inferior bladder wall. This is slightly more pronounced than prior study. Cannot exclude infiltrative bladder wall process.  Uterus and adnexa unremarkable. Small bowel is decompressed. Few scattered colonic diverticula. No active diverticulitis. Large bowel otherwise unremarkable. Stomach unremarkable. Small hiatal hernia.  No acute bony abnormality. Leftward scoliosis and degenerative changes throughout the thoracolumbar spine.  IMPRESSION: Apparent increase in bladder wall thickening along the posterior and inferior bladder walls. Cannot exclude infiltrating bladder wall mass. Recommend clinical correlation for hematuria and possible cystoscopy.  Stable mild biliary and pancreatic ductal dilatation of questionable significance. Recommend correlation with laboratory values.  Right lower pole nephrolithiasis. Renal cortical thinning and scarring.   Electronically Signed   By: Charlett Nose M.D.   On: 02/26/2013 16:05    IMPRESSION:  -Suspect UGIB:  Rule out ulcer disease (uses occasional Goodie powders) vs AVM and other etiologies.  BUN elevated. -Blood loss anemia:  Hgb 12.5 grams just 6 months ago and down to 7.7  grams this AM. -Chronic diarrhea with recent colonoscopy in [place]:  Patient cannot remember results. -History of ulcers several years ago.   PLAN: -Continue PPI gtt for now.   -EGD today.   -Monitor Hgb and transfuse prn.   ZEHR, JESSICA  D.  02/27/2013, 8:42 AM  Pager number [phone]     Attending physician's note   I have taken a history, examined the patient and reviewed the chart. I agree with the Advanced Practitioner's note, impression and recommendations. UGI bleed. R/O ulcer, AVM, etc. Recommend transfusions to Hb > 9, IV PPI infusion for now and EGD today.  Meryl Dare, MD Clement[facility] Graham

## 2013-02-27 NOTE — Consult Note (Signed)
Referring Provider: No ref. provider found Primary Care Physician:  BLUTH, KIRK, MD Primary Gastroenterologist:  Unassigned; has GI MD in [place]  Reason for Consultation:  GIB; black stools  HPI: Renee Blake is a 79 y.o. female presented to the emergency department at [facility] with a history of dark, tarry stools and dizziness/weakness for the past one week. While in the emergency department she had dark stool and was fecal occult positive.  There was no GI coverage this weekend at [facility] and after discussion with family, plan was made to transfer to [place] for further evaluation.   History obtained from the patient. She has been dizzy for the last 2 days. Approximately one week ago she noticed dark, tarry stool with 2-3 episodes per day.  She had some abdominal discomfort since the black stools began, but no pain per se. She said no vomiting, but has nausea. She reports a history of gastric ulcers perhaps 20-30 years ago, no history of surgery.  She reports a colonoscopy by Dr. In [place] a few months ago but cannot relay the findings of this. These records are not available. She admits to using Goodie powders a couple of times a month for headaches. No alcohol use.  Says that she has lost about 40 pounds in the past year.  Takes daily prilosec at home.     In the emergency department she was noted to be afebrile with stable vital signs. Complete metabolic panel is unremarkable. Troponin was negative. Lactic acid normal. Hemoglobin 9.9. Last hemoglobin 07/2012 was 12.5. Urinalysis was grossly positive. CT of the abdomen and pelvis with no acute abnormalities.  Hgb this AM is 7.7 grams.  She is on a PPI gtt.    Past Medical History  Diagnosis Date  . Hypertension   . Hyperlipemia   . History of stomach ulcers     1980s or 1990s  . Skin cancer   . Chronic diarrhea   . Vertigo     Past Surgical History  Procedure Laterality Date  . Nose surgery      for skin cancer    Prior to  Admission medications   Medication Sig Start Date End Date Taking? Authorizing Provider  acetaminophen (TYLENOL) 500 MG tablet Take 1,000 mg by mouth daily as needed for headache.   Yes Historical Provider, MD  atenolol (TENORMIN) 50 MG tablet Take 50 mg by mouth 2 (two) times daily.   Yes Historical Provider, MD  lisinopril (PRINIVIL,ZESTRIL) 2.5 MG tablet Take 2.5 mg by mouth daily.   Yes Historical Provider, MD  loperamide (IMODIUM A-D) 2 MG tablet Take 4 mg by mouth daily as needed for diarrhea or loose stools.   Yes Historical Provider, MD  meclizine (ANTIVERT) 25 MG tablet Take 25 mg by mouth 2 (two) times daily as needed for dizziness (VERTIGO).   Yes Historical Provider, MD  omeprazole (PRILOSEC) 20 MG capsule Take 20 mg by mouth daily.    Yes Historical Provider, MD  potassium chloride (K-DUR) 10 MEQ tablet Take 10-20 mEq by mouth 2 (two) times daily. Takes two in the morning and one in the evening   Yes Historical Provider, MD  Tetrahydrozoline HCl (EYE DROPS OP) Apply 2 drops to eye daily as needed (dry eyes).   Yes Historical Provider, MD  traMADol (ULTRAM) 50 MG tablet Take 50 mg by mouth 4 (four) times daily.   Yes Historical Provider, MD  triamterene-hydrochlorothiazide (MAXZIDE-25) 37.5-25 MG per tablet Take 1 tablet by mouth   daily.   Yes Historical Provider, MD    Current Facility-Administered Medications  Medication Dose Route Frequency Provider Last Rate Last Dose  . 0.9 %  sodium chloride infusion   Intravenous Continuous Daniel P Goodrich, MD 75 mL/hr at 02/27/13 0526    . acetaminophen (TYLENOL) tablet 650 mg  650 mg Oral Q6H PRN Daniel P Goodrich, MD   650 mg at 02/26/13 2305   Or  . acetaminophen (TYLENOL) suppository 650 mg  650 mg Rectal Q6H PRN Daniel P Goodrich, MD      . cefTRIAXone (ROCEPHIN) 1 g in dextrose 5 % 50 mL IVPB  1 g Intravenous Q24H Daniel P Goodrich, MD      . [START ON 02/28/2013] influenza vac split quadrivalent PF (FLUARIX) injection 0.5 mL  0.5 mL  Intramuscular Tomorrow-1000 Vijaya Akula, MD      . ondansetron (ZOFRAN) tablet 4 mg  4 mg Oral Q6H PRN Daniel P Goodrich, MD       Or  . ondansetron (ZOFRAN) injection 4 mg  4 mg Intravenous Q6H PRN Daniel P Goodrich, MD      . pantoprazole (PROTONIX) 80 mg in sodium chloride 0.9 % 250 mL infusion  8 mg/hr Intravenous Continuous Daniel P Goodrich, MD 25 mL/hr at 02/27/13 0525 8 mg/hr at 02/27/13 0525  . [START ON 03/02/2013] pantoprazole (PROTONIX) injection 40 mg  40 mg Intravenous Q12H Daniel P Goodrich, MD        Allergies as of 02/26/2013 - Review Complete 02/26/2013  Allergen Reaction Noted  . Codeine Other (See Comments) 08/24/2012  . Sulfa antibiotics Other (See Comments) 08/24/2012    Family History  Problem Relation Age of Onset  . Heart attack Father     History   Social History  . Marital Status: Legally Separated    Spouse Name: N/A    Number of Children: N/A  . Years of Education: N/A   Occupational History  . Not on file.   Social History Main Topics  . Smoking status: Never Smoker   . Smokeless tobacco: Not on file  . Alcohol Use: No  . Drug Use: No  . Sexual Activity: Not on file   Other Topics Concern  . Not on file   Social History Narrative  . No narrative on file    Review of Systems: Ten point ROS is O/W negative except as mentioned in HPI.  Physical Exam: Vital signs in last 24 hours: Temp:  [97.4 F (36.3 C)-98.4 F (36.9 C)] 98.4 F (36.9 C) (11/29 0528) Pulse Rate:  [61-68] 66 (11/29 0528) Resp:  [15-20] 17 (11/29 0528) BP: (108-143)/(46-72) 108/47 mmHg (11/29 0528) SpO2:  [94 %-100 %] 97 % (11/29 0528) Weight:  [108 lb (48.988 kg)] 108 lb (48.988 kg) (11/28 1159) Last BM Date: 02/26/13 General:  Alert, thin, pleasant and cooperative in NAD Head:  Normocephalic and atraumatic. Eyes:  Sclera clear, no icterus.  Conjunctiva pink. Ears:  Normal auditory acuity. Mouth:  No deformity or lesions.   Lungs:  Clear throughout to  auscultation.  No wheezes, crackles, or rhonchi.  Heart:  Regular rate and rhythm; no murmurs, clicks, rubs,  or gallops. Abdomen:  Soft, non-distended.  BS present.  Non-tender.  Rectal:  Deferred.  Heme positive.  Msk:  Symmetrical without gross deformities. Pulses:  Normal pulses noted. Extremities:  Without clubbing or edema. Neurologic:  Alert and  oriented x4;  grossly normal neurologically. Skin:  Intact without significant lesions or rashes. Psych:  Alert and   cooperative. Normal mood and affect.  Intake/Output from previous day: 11/28 0701 - 11/29 0700 In: 722 [I.V.:722] Out: 208 [Urine:208]  Lab Results:  Recent Labs  02/26/13 1415 02/26/13 2350 02/27/13 0445  WBC 6.5 7.0 5.4  HGB 9.9* 8.3* 7.7*  HCT 29.7* 24.5* 22.6*  PLT 289 238 220   BMET  Recent Labs  02/26/13 1415  NA 138  K 3.7  CL 101  CO2 26  GLUCOSE 99  BUN 36*  CREATININE 0.95  CALCIUM 9.6   LFT  Recent Labs  02/26/13 1415  PROT 7.4  ALBUMIN 3.7  AST 14  ALT 9  ALKPHOS 66  BILITOT 0.2*   Studies/Results: Dg Chest 2 View  02/26/2013   CLINICAL DATA:  Rectal bleeding.  Hypertension.  EXAM: CHEST  2 VIEW  COMPARISON:  08/24/2012.  FINDINGS: Again noted is apical pleural thickening consistent with scarring. Lungs are clear of acute infiltrates. No pleural effusion or pneumothorax. Heart size and pulmonary vascularity normal. Degenerative changes both shoulders. Degenerative changes thoracic spine.  IMPRESSION: No active cardiopulmonary disease.   Electronically Signed   By: Thomas  Register   On: 02/26/2013 14:54   Ct Abdomen Pelvis W Contrast  02/26/2013   CLINICAL DATA:  Rectal bleeding, abdominal pain.  EXAM: CT ABDOMEN AND PELVIS WITH CONTRAST  TECHNIQUE: Multidetector CT imaging of the abdomen and pelvis was performed using the standard protocol following bolus administration of intravenous contrast.  CONTRAST:  50mL OMNIPAQUE IOHEXOL 300 MG/ML SOLN, 100mL OMNIPAQUE IOHEXOL 300 MG/ML  SOLN  COMPARISON:  08/24/2012  FINDINGS: Lingular and right middle lobe scarring. Lung bases otherwise clear. No effusions. Heart is normal size.  Tiny hypodensity in the dome of the liver, stable, likely small cyst. Stable slight intrahepatic ductal dilatation. Borderline size of the common bile duct and pancreatic duct. Small hypervascular areas in the lower pole of the spleen, likely flash filling of hemangiomas. Mild atrophy of the pancreas. No focal lesion. Adrenals are unremarkable.  Bilateral renal cortical thinning and areas of scarring. Small cyst in the upper pole of the right kidney. Small nonobstructing stone in the lower pole of the right kidney. No hydronephrosis or hydroureter. Urinary bladder is grossly unremarkable.  Again noted is bladder wall thickening along the posterior and inferior bladder wall. This is slightly more pronounced than prior study. Cannot exclude infiltrative bladder wall process.  Uterus and adnexa unremarkable. Small bowel is decompressed. Few scattered colonic diverticula. No active diverticulitis. Large bowel otherwise unremarkable. Stomach unremarkable. Small hiatal hernia.  No acute bony abnormality. Leftward scoliosis and degenerative changes throughout the thoracolumbar spine.  IMPRESSION: Apparent increase in bladder wall thickening along the posterior and inferior bladder walls. Cannot exclude infiltrating bladder wall mass. Recommend clinical correlation for hematuria and possible cystoscopy.  Stable mild biliary and pancreatic ductal dilatation of questionable significance. Recommend correlation with laboratory values.  Right lower pole nephrolithiasis. Renal cortical thinning and scarring.   Electronically Signed   By: Kevin  Dover M.D.   On: 02/26/2013 16:05    IMPRESSION:  -Suspect UGIB:  Rule out ulcer disease (uses occasional Goodie powders) vs AVM and other etiologies.  BUN elevated. -Blood loss anemia:  Hgb 12.5 grams just 6 months ago and down to 7.7  grams this AM. -Chronic diarrhea with recent colonoscopy in [place]:  Patient cannot remember results. -History of ulcers several years ago.   PLAN: -Continue PPI gtt for now.   -EGD today.   -Monitor Hgb and transfuse prn.   ZEHR, JESSICA   D.  02/27/2013, 8:42 AM  Pager number [phone]     Attending physician's note   I have taken a history, examined the patient and reviewed the chart. I agree with the Advanced Practitioner's note, impression and recommendations. UGI bleed. R/O ulcer, AVM, etc. Recommend transfusions to Hb > 9, IV PPI infusion for now and EGD today.  Malcolm T Stark, MD FACG   

## 2013-02-27 NOTE — Progress Notes (Signed)
[facility] PROGRESS NOTE  Renee Blake ZOX:[id number] DOB: Aug 06, 1928 DOA: 02/26/2013 PCP: Ernestine Conrad, MD Brief HPI: 79 year old woman presented to the emergency department with a history of dark, tarry stools and dizziness. While in the emergency department she had dark stool and was fecal occult positive. She was referred for further evaluation of GI bleed. There is no GI coverage this weekend at [facility] and after discussion with family, plan was made to transfer to [facility] for further evaluation. GI consulted and she underwent EGD on 11/29 , was found to have duodenal ulcer and actively bleeding, which was cauterized with epi. She was later on transferred tot he floor. 2 units of prbc transfusion ordered. She is continued on PPI drip. Her CT abd and pelvis revealed thickening of the bladder wall, concern for infiltrating bladder wall mass, urology consulted for cystoscopy. 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   Assessment/Plan: 1. Actively bleeding duodenal ulcer. : - s/p EGD by GI, on IV PPI.  - iv FLUIDS, 2 units of PRBC transfusion ordered.  - q4hr cbc's.  - GI on board.    2. Abnormal CT abd pelvis showed thickened bladder wall: - urology consulted for cystoscopy.    3. Hypotension; possibly from the anesthesia.  - will bolus her with normal saline and recheck bp in one hour.   4. UTI: - cultures pending on IV rocephin.   5. DVT prophylaxis.   Code Status: full code Family Communication: multiple family members on the bedside. Discussed the plan of care witht he daughter and the grand daughter.  Disposition Plan: remain in patient.    Consultants: [place] [facility]  Procedures:  EGD on 11/29:   Antibiotics:  Rocephin 11/29  HPI/Subjective: No new complaints.   Objective: Filed Vitals:   02/27/13 0528  BP: 108/47  Pulse: 66  Temp: 98.4 F (36.9 C)  Resp: 17    Intake/Output  Summary (Last 24 hours) at 02/27/13 0830 Last data filed at 02/27/13 0526  Gross per 24 hour  Intake    722 ml  Output    208 ml  Net    514 ml   Filed Weights   02/26/13 1159  Weight: 48.988 kg (108 lb)    Exam:   General:  Sleeping, comfortable  Cardiovascular: s1s2  Respiratory: ctab  Abdomen: soft mildly tender, ND BS+  Musculoskeletal: No pedal edema  Data Reviewed: Basic Metabolic Panel:  Recent Labs Lab 02/26/13 1415  NA 138  K 3.7  CL 101  CO2 26  GLUCOSE 99  BUN 36*  CREATININE 0.95  CALCIUM 9.6   Liver Function Tests:  Recent Labs Lab 02/26/13 1415  AST 14  ALT 9  ALKPHOS 66  BILITOT 0.2*  PROT 7.4  ALBUMIN 3.7    Recent Labs Lab 02/26/13 1415  LIPASE 27   No results found for this basename: AMMONIA,  in the last 168 hours CBC:  Recent Labs Lab 02/26/13 1415 02/26/13 2350 02/27/13 0445  WBC 6.5 7.0 5.4  NEUTROABS 4.3  --   --   HGB 9.9* 8.3* 7.7*  HCT 29.7* 24.5* 22.6*  MCV 90.3 89.1 88.3  PLT 289 238 220   Cardiac Enzymes:  Recent Labs Lab 02/26/13 1415  TROPONINI <0.30   BNP (last 3 results) No results found for this basename: PROBNP,  in the last 8760 hours CBG: No results found for this basename: GLUCAP,  in the last 168  hours  Recent Results (from the past 240 hour(s))  CLOSTRIDIUM DIFFICILE BY PCR     Status: None   Collection Time    02/26/13  5:04 PM      Result Value Range Status   C difficile by pcr NEGATIVE  NEGATIVE Final     Studies: Dg Chest 2 View  02/26/2013   CLINICAL DATA:  Rectal bleeding.  Hypertension.  EXAM: CHEST  2 VIEW  COMPARISON:  08/24/2012.  FINDINGS: Again noted is apical pleural thickening consistent with scarring. Lungs are clear of acute infiltrates. No pleural effusion or pneumothorax. Heart size and pulmonary vascularity normal. Degenerative changes both shoulders. Degenerative changes thoracic spine.  IMPRESSION: No active cardiopulmonary disease.   Electronically Signed   By:  Maisie Fus  Register   On: 02/26/2013 14:54   Ct Abdomen Pelvis W Contrast  02/26/2013   CLINICAL DATA:  Rectal bleeding, abdominal pain.  EXAM: CT ABDOMEN AND PELVIS WITH CONTRAST  TECHNIQUE: Multidetector CT imaging of the abdomen and pelvis was performed using the standard protocol following bolus administration of intravenous contrast.  CONTRAST:  50mL OMNIPAQUE IOHEXOL 300 MG/ML SOLN, OMNIPAQUE IOHEXOL 300 MG/ML SOLN  COMPARISON:  08/24/2012  FINDINGS: Lingular and right middle lobe scarring. Lung bases otherwise clear. No effusions. Heart is normal size.  Tiny hypodensity in the dome of the liver, stable, likely small cyst. Stable slight intrahepatic ductal dilatation. Borderline size of the common bile duct and pancreatic duct. Small hypervascular areas in the lower pole of the spleen, likely flash filling of hemangiomas. Mild atrophy of the pancreas. No focal lesion. Adrenals are unremarkable.  Bilateral renal cortical thinning and areas of scarring. Small cyst in the upper pole of the right kidney. Small nonobstructing stone in the lower pole of the right kidney. No hydronephrosis or hydroureter. Urinary bladder is grossly unremarkable.  Again noted is bladder wall thickening along the posterior and inferior bladder wall. This is slightly more pronounced than prior study. Cannot exclude infiltrative bladder wall process.  Uterus and adnexa unremarkable. Small bowel is decompressed. Few scattered colonic diverticula. No active diverticulitis. Large bowel otherwise unremarkable. Stomach unremarkable. Small hiatal hernia.  No acute bony abnormality. Leftward scoliosis and degenerative changes throughout the thoracolumbar spine.  IMPRESSION: Apparent increase in bladder wall thickening along the posterior and inferior bladder walls. Cannot exclude infiltrating bladder wall mass. Recommend clinical correlation for hematuria and possible cystoscopy.  Stable mild biliary and pancreatic ductal dilatation of  questionable significance. Recommend correlation with laboratory values.  Right lower pole nephrolithiasis. Renal cortical thinning and scarring.   Electronically Signed   By: Charlett Nose M.D.   On: 02/26/2013 16:05    Scheduled Meds: . cefTRIAXone (ROCEPHIN)  IV  1 g Intravenous Q24H  . [START ON 02/28/2013] influenza vac split quadrivalent PF  0.5 mL Intramuscular Tomorrow-1000  . [START ON 03/02/2013] pantoprazole (PROTONIX) IV  40 mg Intravenous Q12H   Continuous Infusions: . sodium chloride 75 mL/hr at 02/27/13 0526  . pantoprozole (PROTONIX) infusion 8 mg/hr (02/27/13 0525)    Principal Problem:   GIB (gastrointestinal bleeding) Active Problems:   Acute blood loss anemia   UTI (lower urinary tract infection)   Hypertension    Time spent: 25 MIN    Renee Blake  [facility] Pager [phone]. If 7PM-7AM, please contact night-coverage at [email or web address], password [facility] 02/27/2013, 8:30 AM  LOS: 1 day

## 2013-02-27 NOTE — Consult Note (Signed)
Urology Consult  Requesting provider:  Dr. Hazeline Junker  CC: Bladder mass  HPI: 79 year old female admitted for acute blood loss anemia due to upper GI bleed. CT abd/pelvis w/ IV contrast revealed no enhancing renal masses, stones, or hydronephrosis. There was thickening of the bladder wall. UA shows trace Hgb w/ TNTC WBC; urine culture pending. I was consulted for this finding of bladder lesion. She denies gross hematuria, frequency, or dysuria. No recurrent UTI's. She feels that her bladder has "dropped" into her vagina over the years. Negative history of smoking, chemotherapy, exposure to textile dye or rubbers. Positive history of exposure to second hand smoke. Nothing makes this better or worse.    PMH: Past Medical History  Diagnosis Date  . Hypertension   . Hyperlipemia   . History of stomach ulcers     1980s or 1990s  . Skin cancer   . Chronic diarrhea   . Vertigo     PSH: Past Surgical History  Procedure Laterality Date  . Nose surgery      for skin cancer    Allergies: Allergies  Allergen Reactions  . Codeine Other (See Comments)    Reaction unknown  . Sulfa Antibiotics Other (See Comments)    Reactions unknown    Medications: Prescriptions prior to admission  Medication Sig Dispense Refill  . acetaminophen (TYLENOL) 500 MG tablet Take 1,000 mg by mouth daily as needed for headache.      Marland Kitchen atenolol (TENORMIN) 50 MG tablet Take 50 mg by mouth 2 (two) times daily.      Marland Kitchen lisinopril (PRINIVIL,ZESTRIL) 2.5 MG tablet Take 2.5 mg by mouth daily.      Marland Kitchen loperamide (IMODIUM A-D) 2 MG tablet Take 4 mg by mouth daily as needed for diarrhea or loose stools.      . meclizine (ANTIVERT) 25 MG tablet Take 25 mg by mouth 2 (two) times daily as needed for dizziness (VERTIGO).      Marland Kitchen omeprazole (PRILOSEC) 20 MG capsule Take 20 mg by mouth daily.       . potassium chloride (K-DUR) 10 MEQ tablet Take 10-20 mEq by mouth 2 (two) times daily. Takes two in the morning and one in the  evening      . Tetrahydrozoline HCl (EYE DROPS OP) Apply 2 drops to eye daily as needed (dry eyes).      . traMADol (ULTRAM) 50 MG tablet Take 50 mg by mouth 4 (four) times daily.      Marland Kitchen triamterene-hydrochlorothiazide (MAXZIDE-25) 37.5-25 MG per tablet Take 1 tablet by mouth daily.         Social History: History   Social History  . Marital Status: Legally Separated    Spouse Name: N/A    Number of Children: N/A  . Years of Education: N/A   Occupational History  . Not on file.   Social History Main Topics  . Smoking status: Never Smoker   . Smokeless tobacco: Not on file  . Alcohol Use: No  . Drug Use: No  . Sexual Activity: Not on file   Other Topics Concern  . Not on file   Social History Narrative  . No narrative on file    Family History: Family History  Problem Relation Age of Onset  . Heart attack Father     Review of Systems: Positive: Weakness, fatigue. Negative: Cough, fever, or chest pain.  A further 10 point review of systems was negative except what is listed in the HPI.  Physical Exam: Filed Vitals:   02/28/13 0542  BP: 133/54  Pulse: 68  Temp: 97.7 F (36.5 C)  Resp: 18    General: No acute distress.  Awake. Head:  Normocephalic.  Atraumatic. ENT:  EOMI.  Mucous membranes moist Neck:  Supple.  No lymphadenopathy. CV:  S1 present. S2 present. Regular rate. Pulmonary: Equal effort bilaterally.  Clear to auscultation bilaterally. Abdomen: Soft.  Mildly diffusely tender to palpation. Skin:  Normal turgor.  No visible rash. Extremity: No gross deformity of bilateral upper extremities.  No gross deformity of    bilateral lower extremities. Neurologic: Alert. Appropriate mood.    Studies:  Recent Labs     02/27/13  0445  02/27/13  0940  HGB  7.7*  7.6*  WBC  5.4  4.4  PLT  220  216    Recent Labs     02/26/13  1415  NA  138  K  3.7  CL  101  CO2  26  BUN  36*  CREATININE  0.95  CALCIUM  9.6  GFRNONAA  54*  GFRAA  63*      No results found for this basename: PT, INR, APTT,  in the last 72 hours   No components found with this basename: ABG,     Assessment:  Upper GI bleed. Possible UTI. Bladder mass.  Plan: I explained that the findings in the bladder could be due to a mass or thickening of the bladder wall due to UTI. We discussed cystoscopy along w/ risks/benefits/alternatives/SEs.  She would like to proceed w/ cystoscopy as an outpatient; I don't recommend doing this now while she is acutely ill from the GI bleed and with a possible UTI.  This will be scheduled as an outpatient in my office in about 2 weeks. I provided my patient w/ my card.    Pager: [phone]    CC: Dr. Hazeline Junker

## 2013-02-28 DIAGNOSIS — R197 Diarrhea, unspecified: Secondary | ICD-10-CM

## 2013-02-28 DIAGNOSIS — R634 Abnormal weight loss: Secondary | ICD-10-CM

## 2013-02-28 LAB — TYPE AND SCREEN
ABO/RH(D): O NEG
Antibody Screen: NEGATIVE
Unit division: 0
Unit division: 0

## 2013-02-28 LAB — HEMOGLOBIN AND HEMATOCRIT, BLOOD
HCT: 28.4 % — ABNORMAL LOW (ref 36.0–46.0)
HCT: 29 % — ABNORMAL LOW (ref 36.0–46.0)
HCT: 28.8 % — ABNORMAL LOW (ref 36.0–46.0)
Hemoglobin: 10.3 g/dL — ABNORMAL LOW (ref 12.0–15.0)
Hemoglobin: 10.4 g/dL — ABNORMAL LOW (ref 12.0–15.0)
Hemoglobin: 10.4 g/dL — ABNORMAL LOW (ref 12.0–15.0)

## 2013-02-28 LAB — GI PATHOGEN PANEL BY PCR, STOOL
C difficile toxin A/B: NEGATIVE
Campylobacter by PCR: NEGATIVE
Cryptosporidium by PCR: NEGATIVE
E coli (ETEC) LT/ST: NEGATIVE
E coli (STEC): NEGATIVE
E coli 0157 by PCR: NEGATIVE
G lamblia by PCR: NEGATIVE
Norovirus GI/GII: NEGATIVE
Rotavirus A by PCR: NEGATIVE
Salmonella by PCR: NEGATIVE
Shigella by PCR: NEGATIVE

## 2013-02-28 MED ORDER — HYOSCYAMINE SULFATE 0.125 MG PO TABS
0.2500 mg | ORAL_TABLET | Freq: Three times a day (TID) | ORAL | Status: DC
  Administered 2013-02-28 – 2013-03-02 (×11): 0.25 mg via ORAL
  Filled 2013-02-28 (×19): qty 2

## 2013-02-28 NOTE — Progress Notes (Signed)
[facility] Progress Note  Subjective:  No BM since last night and was getting lighter in color.  Tolerating clears since last PM.  Received two units PRBC's.  Objective:  Vital signs in last 24 hours: Temp:  [97.7 F (36.5 C)-98.9 F (37.2 C)] 97.7 F (36.5 C) (11/30 0542) Pulse Rate:  [63-80] 68 (11/30 0542) Resp:  [0-21] 18 (11/30 0542) BP: (83-142)/(25-76) 133/54 mmHg (11/30 0542) SpO2:  [97 %-100 %] 98 % (11/30 0542) Last BM Date: 02/26/13 General:  Alert, Well-developed, in NAD Heart:  Regular rate and rhythm Pulm:  CTAB.  No W/R/R. Abdomen:  Soft, non-distended.  BS present.  Non-tender. Extremities:  Without edema. Neurologic:  Alert and  oriented x4;  grossly normal neurologically. Psych:  Alert and cooperative. Normal mood and affect.  Intake/Output from previous day: 11/29 0701 - 11/30 0700 In: 15 [Blood:15] Out: 2 [Stool:2]  Lab Results:  Recent Labs  02/27/13 0445 02/27/13 0940 02/27/13 1310 02/28/13 0200  WBC 5.4 4.4 6.5  --   HGB 7.7* 7.6* 7.6* 10.3*  HCT 22.6* 22.4* 22.0* 28.4*  PLT 220 216 205  --    BMET  Recent Labs  02/26/13 1415  NA 138  K 3.7  CL 101  CO2 26  GLUCOSE 99  BUN 36*  CREATININE 0.95  CALCIUM 9.6   LFT  Recent Labs  02/26/13 1415  PROT 7.4  ALBUMIN 3.7  AST 14  ALT 9  ALKPHOS 66  BILITOT 0.2*   Dg Chest 2 View  02/26/2013   CLINICAL DATA:  Rectal bleeding.  Hypertension.  EXAM: CHEST  2 VIEW  COMPARISON:  08/24/2012.  FINDINGS: Again noted is apical pleural thickening consistent with scarring. Lungs are clear of acute infiltrates. No pleural effusion or pneumothorax. Heart size and pulmonary vascularity normal. Degenerative changes both shoulders. Degenerative changes thoracic spine.  IMPRESSION: No active cardiopulmonary disease.   Electronically Signed   By: Maisie Fus  Register   On: 02/26/2013 14:54   Ct Abdomen Pelvis W Contrast  02/26/2013   CLINICAL DATA:  Rectal bleeding, abdominal pain.  EXAM:  CT ABDOMEN AND PELVIS WITH CONTRAST  TECHNIQUE: Multidetector CT imaging of the abdomen and pelvis was performed using the standard protocol following bolus administration of intravenous contrast.  CONTRAST:  50mL OMNIPAQUE IOHEXOL 300 MG/ML SOLN, OMNIPAQUE IOHEXOL 300 MG/ML SOLN  COMPARISON:  08/24/2012  FINDINGS: Lingular and right middle lobe scarring. Lung bases otherwise clear. No effusions. Heart is normal size.  Tiny hypodensity in the dome of the liver, stable, likely small cyst. Stable slight intrahepatic ductal dilatation. Borderline size of the common bile duct and pancreatic duct. Small hypervascular areas in the lower pole of the spleen, likely flash filling of hemangiomas. Mild atrophy of the pancreas. No focal lesion. Adrenals are unremarkable.  Bilateral renal cortical thinning and areas of scarring. Small cyst in the upper pole of the right kidney. Small nonobstructing stone in the lower pole of the right kidney. No hydronephrosis or hydroureter. Urinary bladder is grossly unremarkable.  Again noted is bladder wall thickening along the posterior and inferior bladder wall. This is slightly more pronounced than prior study. Cannot exclude infiltrative bladder wall process.  Uterus and adnexa unremarkable. Small bowel is decompressed. Few scattered colonic diverticula. No active diverticulitis. Large bowel otherwise unremarkable. Stomach unremarkable. Small hiatal hernia.  No acute bony abnormality. Leftward scoliosis and degenerative changes throughout the thoracolumbar spine.  IMPRESSION: Apparent increase in bladder wall thickening along the posterior and inferior bladder walls.  Cannot exclude infiltrating bladder wall mass. Recommend clinical correlation for hematuria and possible cystoscopy.  Stable mild biliary and pancreatic ductal dilatation of questionable significance. Recommend correlation with laboratory values.  Right lower pole nephrolithiasis. Renal cortical thinning and scarring.    Electronically Signed   By: Charlett Nose M.D.   On: 02/26/2013 16:05    Assessment / Plan: -Bleeding DU seen on EGD on 11/29 s/p BICAP and epi injection with hemostasis achieved.  Also with duodenal bulb stenosis and deformity and stricture at GE junction (not dilated). -UGIB secondary to above. -ABLA:  S/p 2 units PRBC's with good response in Hgb.  -Chronic diarrhea with recent colonoscopy as outpatient in [place]. C. Diff negative. Stool pathogen panel pending. Will need to follow-up with regards to this issue.  We do not have access to those results currently and she does not recall the findings.  Will contact their office tomorrow during office hours for records. -Weight loss:  Possibly 40 pounds in the past year. -Abnormal CT scan during ED visit showed bladder wall thickening, possible mass.  Seen by urology and will follow-up for outpatient cystoscopy.  -Migraines:  Needs to work with PCP for headache regimen that does not include BC's, Goodie powders, or other NSAID's.  *Monitor Hb and transfuse further if needed. *Continue PPI gtt x 72 hours then BID for 8 weeks then daily indefinitely. *Treat if H. pylori is positive. *Will advance to full liquids. *Avoid NSAID's indefinitely. Can resume ASA in 4 weeks if needed (says that she has not been taking the ASA recently). *Follow up on stool pathogen panel  *Call for colonoscopy records tomorrow *Add Levsin ac for possible IBS    LOS: 2 days   ZEHR, JESSICA D.  02/28/2013, 8:38 AM  Pager number [phone]     Attending physician's note   I have taken an interval history, reviewed the chart and examined the patient. I agree with the Advanced Practitioner's note, impression and recommendations.   Venita Lick. Russella Dar, MD [facility]

## 2013-02-28 NOTE — Progress Notes (Signed)
[facility] PROGRESS NOTE  Renee Blake ZOX:[id number] DOB: June 07, 1928 DOA: 02/26/2013 PCP: Ernestine Conrad, MD Brief HPI: 79 year old woman presented to the emergency department with a history of dark, tarry stools and dizziness. While in the emergency department she had dark stool and was fecal occult positive. She was referred for further evaluation of GI bleed. There is no GI coverage this weekend at [facility] and after discussion with family, plan was made to transfer to [facility] for further evaluation. GI consulted and she underwent EGD on 11/29 , was found to have duodenal ulcer and actively bleeding, which was cauterized with epi. She was later on transferred tot he floor. 2 units of prbc transfusion ordered. She is continued on PPI drip. Her CT abd and pelvis revealed thickening of the bladder wall, concern for infiltrating bladder wall mass, urology consulted for cystoscopy. 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   Assessment/Plan: 1. Actively bleeding duodenal ulcer. : - s/p EGD by GI, on IV PPI.  - iv FLUIDS, 2 units of PRBC transfusion ordered. Her H&H is stable 10.4. - q8hr H&H.  - GI on board.  - c diff is negative. Gi pathogen pending.    2. Abnormal CT abd pelvis showed thickened bladder wall: - urology consulted for cystoscopy.    3. Hypotension; possibly from the anesthesia.  - will bolus her with normal saline and recheck bp in one hour.   4. UTI: - cultures pending on IV rocephin.   5. DVT prophylaxis.   Code Status: full code Family Communication: multiple family members on the bedside. Discussed the plan of care witht he daughter over the phone   Disposition Plan: remain in patient.    Consultants: [facility]  Procedures:  EGD on 11/29:   Antibiotics:  Rocephin 11/29  HPI/Subjective: No new complaints.  Comfortable.   Objective: Filed Vitals:   02/28/13 0542  BP: 133/54   Pulse: 68  Temp: 97.7 F (36.5 C)  Resp: 18    Intake/Output Summary (Last 24 hours) at 02/28/13 1711 Last data filed at 02/28/13 1417  Gross per 24 hour  Intake     15 ml  Output      2 ml  Net     13 ml   Filed Weights   02/26/13 1159  Weight: 48.988 kg (108 lb)    Exam:   General:  Sleeping, comfortable  Cardiovascular: s1s2  Respiratory: ctab  Abdomen: soft mildly tender, ND BS+  Musculoskeletal: No pedal edema  Data Reviewed: Basic Metabolic Panel:  Recent Labs Lab 02/26/13 1415  NA 138  K 3.7  CL 101  CO2 26  GLUCOSE 99  BUN 36*  CREATININE 0.95  CALCIUM 9.6   Liver Function Tests:  Recent Labs Lab 02/26/13 1415  AST 14  ALT 9  ALKPHOS 66  BILITOT 0.2*  PROT 7.4  ALBUMIN 3.7    Recent Labs Lab 02/26/13 1415  LIPASE 27   No results found for this basename: AMMONIA,  in the last 168 hours CBC:  Recent Labs Lab 02/26/13 1415 02/26/13 2350 02/27/13 0445 02/27/13 0940 02/27/13 1310 02/28/13 0200 02/28/13 1228  WBC 6.5 7.0 5.4 4.4 6.5  --   --   NEUTROABS 4.3  --   --   --   --   --   --   HGB 9.9* 8.3* 7.7* 7.6* 7.6* 10.3* 10.4*  HCT 29.7* 24.5* 22.6* 22.4* 22.0* 28.4* 29.0*  MCV 90.3 89.1 88.3 89.2 88.4  --   --   PLT 289 238 220 216 205  --   --    Cardiac Enzymes:  Recent Labs Lab 02/26/13 1415  TROPONINI <0.30   BNP (last 3 results) No results found for this basename: PROBNP,  in the last 8760 hours CBG: No results found for this basename: GLUCAP,  in the last 168 hours  Recent Results (from the past 240 hour(s))  URINE CULTURE     Status: None   Collection Time    02/26/13  3:00 PM      Result Value Range Status   Specimen Description URINE, CATHETERIZED   Final   Special Requests NONE   Final   Culture  Setup Time     Final   Value: 02/27/2013 00:33     Performed at [facility] Count     Final   Value: >=100,000 COLONIES/ML     Performed at [facility]   Culture     Final    Value: GRAM NEGATIVE RODS     Performed at [facility]   Report Status PENDING   Incomplete  CLOSTRIDIUM DIFFICILE BY PCR     Status: None   Collection Time    02/26/13  5:04 PM      Result Value Range Status   C difficile by pcr NEGATIVE  NEGATIVE Final     Studies: No results found.  Scheduled Meds: . cefTRIAXone (ROCEPHIN)  IV  1 g Intravenous Q24H  . hyoscyamine  0.25 mg Oral TID AC & HS  . [START ON 03/02/2013] pantoprazole (PROTONIX) IV  40 mg Intravenous Q12H   Continuous Infusions: . sodium chloride 75 mL/hr at 02/28/13 1420  . sodium chloride    . pantoprozole (PROTONIX) infusion 8 mg/hr (02/27/13 1606)    Principal Problem:   GIB (gastrointestinal bleeding) Active Problems:   Acute blood loss anemia   UTI (lower urinary tract infection)   Hypertension   Acute duodenal ulcer with hemorrhage, without mention of obstruction   Diarrhea   Loss of weight    Time spent: 25 MIN    Vera Wishart  [facility] Pager [phone]. If 7PM-7AM, please contact night-coverage at [email or web address], password [facility] 02/28/2013, 5:11 PM  LOS: 2 days

## 2013-03-01 ENCOUNTER — Encounter (HOSPITAL_COMMUNITY): Payer: Self-pay | Admitting: Gastroenterology

## 2013-03-01 DIAGNOSIS — E440 Moderate protein-calorie malnutrition: Secondary | ICD-10-CM | POA: Insufficient documentation

## 2013-03-01 LAB — URINE CULTURE: Colony Count: >=100000

## 2013-03-01 LAB — HEMOGLOBIN AND HEMATOCRIT, BLOOD
HCT: 28.4 % — ABNORMAL LOW (ref 36.0–46.0)
HCT: 30.9 % — ABNORMAL LOW (ref 36.0–46.0)
Hemoglobin: 9.9 g/dL — ABNORMAL LOW (ref 12.0–15.0)
Hemoglobin: 11.3 g/dL — ABNORMAL LOW (ref 12.0–15.0)

## 2013-03-01 LAB — H. PYLORI ANTIBODY, IGG: H Pylori IgG: <0.4 {ISR}

## 2013-03-01 MED ORDER — PREDNISONE 5 MG PO TABS
5.0000 mg | ORAL_TABLET | Freq: Every day | ORAL | Status: DC
Start: 2013-03-11 — End: 2013-03-03

## 2013-03-01 MED ORDER — PREDNISONE 10 MG PO TABS: 10.0000 mg | ORAL_TABLET | Freq: Every day | ORAL | Status: DC

## 2013-03-01 MED ORDER — CIPROFLOXACIN HCL 500 MG PO TABS
500.0000 mg | ORAL_TABLET | Freq: Two times a day (BID) | ORAL | Status: DC
  Administered 2013-03-01 – 2013-03-03 (×5): 500 mg via ORAL
  Filled 2013-03-01 (×7): qty 1

## 2013-03-01 MED ORDER — PREDNISONE 20 MG PO TABS: 20.0000 mg | ORAL_TABLET | Freq: Every day | ORAL | Status: DC

## 2013-03-01 MED ORDER — PREDNISONE 5 MG PO TABS: 15.0000 mg | ORAL_TABLET | Freq: Every day | ORAL | Status: DC

## 2013-03-01 MED ORDER — PREDNISONE 20 MG PO TABS
20.0000 mg | ORAL_TABLET | Freq: Every day | ORAL | Status: DC
  Administered 2013-03-02 – 2013-03-03 (×2): 20 mg via ORAL
  Filled 2013-03-01 (×3): qty 1

## 2013-03-01 MED ORDER — LOPERAMIDE HCL 2 MG PO CAPS
2.0000 mg | ORAL_CAPSULE | Freq: Two times a day (BID) | ORAL | Status: DC
  Administered 2013-03-01 – 2013-03-03 (×4): 2 mg via ORAL
  Filled 2013-03-01 (×7): qty 1

## 2013-03-01 MED ORDER — PANTOPRAZOLE SODIUM 40 MG PO TBEC
40.0000 mg | DELAYED_RELEASE_TABLET | Freq: Two times a day (BID) | ORAL | Status: DC
  Administered 2013-03-02 – 2013-03-03 (×3): 40 mg via ORAL
  Filled 2013-03-01 (×3): qty 1

## 2013-03-01 NOTE — Op Note (Signed)
Synopsis of Colonoscopy reprrt 11/09/12 by Dr Jones Skene in [place] for unexplained diarrhea No polyps, no diverticulosis.   Random biopsies:   Microscopic colitis.  .procedures reviewed.   D.Yosselyn Tax

## 2013-03-01 NOTE — Progress Notes (Signed)
Daily Rounding Note  03/01/2013, 8:38 AM  LOS: 3 days   SUBJECTIVE:       No nausea, no abdominal pain.  Tolerating solids.  No dizziness or weakness.  Stool this AM was soft.   OBJECTIVE:         Vital signs in last 24 hours:    Temp:  [98 F (36.7 C)-98.4 F (36.9 C)] 98 F (36.7 C) (12/01 0511) Pulse Rate:  [73-77] 73 (12/01 0511) Resp:  [18] 18 (12/01 0511) BP: (125-130)/(47-57) 130/57 mmHg (12/01 0511) SpO2:  [97 %-98 %] 97 % (12/01 0511) Last BM Date: 02/28/13 General: anxious, rapid speach   Heart: RRR Chest: clear.  No cough or dyspnea Abdomen: soft, NT, ND.  No mass  Extremities: no CCE Neuro/Psych:  Pleasant. Anxious, not confused.   Intake/Output from previous day: 11/30 0701 - 12/01 0700 In: 800 [I.V.:800] Out: 5 [Urine:4; Stool:1]  Intake/Output this shift:    Lab Results:  Recent Labs  02/27/13 0445 02/27/13 0940 02/27/13 1310  02/28/13 1228 02/28/13 1700 03/01/13 0120  WBC 5.4 4.4 6.5  --   --   --   --   HGB 7.7* 7.6* 7.6*  < > 10.4* 10.4* 9.9*  HCT 22.6* 22.4* 22.0*  < > 29.0* 28.8* 28.4*  PLT 220 216 205  --   --   --   --   < > = values in this interval not displayed. BMET  Recent Labs  02/26/13 1415  NA 138  K 3.7  CL 101  CO2 26  GLUCOSE 99  BUN 36*  CREATININE 0.95  CALCIUM 9.6   LFT  Recent Labs  02/26/13 1415  PROT 7.4  ALBUMIN 3.7  AST 14  ALT 9  ALKPHOS 66  BILITOT 0.2*     Studies/Results: No results found.  ASSESMENT:   *UGIB. EGD 11/29: 1. Duodenal ulcer, actively bleeding; Bipolar (BICAP) cautery and  epi injection; complete hemostasis achieved 2. Duodenal bulb stenosis and deformity  3. Stricture at the gastroesophageal junction 4. Small hiatal hernia These possibly from Goody's. On OTC Prilosec at home.   On Protonix drip  *  ABL anemia.  S/p 2 units PRBCs 11/29.  Good response to transfusions.   *  Weight loss, may be secondary to  ulcers and the diarrhea. . Stable mild biliary and PD ductal dilaltion of questionable significance and pancreatic atrophy per CT report.   *  Diarrhea for many years.  Colonoscopy 11/09/12 in [place] by Dr Jones Skene.  Grossly normal:  No polyps or diverticulosis.  Random bx's positive for Microscopic colitis in ascending colon.   Diarrhea improves with Imodium. Pt cancelled her GI follow up of Sept 4th so never followed up.  She says she could not afford the copay for visit.  *  ? UTI:  On Cipro, bladder thickened on CT scan.      PLAN   *  Stop Goodies.  In future ok to use 81 mg ASA if required,  *  If H Pylori +, add treatment.  *  72 hours of Protonix drip,  then begin 8 weeks BID PPI (starts tomorrow AM), then once daily.  *  Add Imodium.  *  CBC in AM.  *  Continue solid foods.  *  Follow up with Dr Teena Dunk, or with Dr Ernestine Conrad, her PMD.      Jennye Moccasin  03/01/2013, 8:38 AM Pager: [phone] Attending  MD note:   I have reviewed the above note, examined the patient and agree with plan of treatment.I have discussed  Diarrhea with her- chronic diarrhea, weight loss of 40 lbs in last year non intentionally. Diarrhea interferes with her getting out of the house. She has never been treated for microscopic colitis, because she could not afford to see Dr Teena Dunk. I think she ought to be for microscopic colitis al least  With a short Prednisone taper to see if she responds. Low dose steroid not likely to interfere with Duodenal ulcer.as long as she is on  PPI's long term.  W[facility] Pager # [phone]

## 2013-03-01 NOTE — Progress Notes (Signed)
[facility] PROGRESS NOTE  Renee Blake ZOX:[id number] DOB: 08/25/1928 DOA: 02/26/2013 PCP: Ernestine Conrad, MD Brief HPI: 79 year old woman presented to the emergency department with a history of dark, tarry stools and dizziness. While in the emergency department she had dark stool and was fecal occult positive. She was referred for further evaluation of GI bleed. There is no GI coverage this weekend at [facility] and after discussion with family, plan was made to transfer to [facility] for further evaluation. GI consulted and she underwent EGD on 11/29 , was found to have duodenal ulcer and actively bleeding, which was cauterized with epi. She was later on transferred tot he floor. 2 units of prbc transfusion ordered. She is continued on PPI drip. Her CT abd and pelvis revealed thickening of the bladder wall, concern for infiltrating bladder wall mass, urology consulted for cystoscopy. 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   Assessment/Plan: 1. Actively bleeding duodenal ulcer. : - s/p EGD by GI, on IV PPI for one more day.  -  2 units of PRBC transfusion,  Her H&H is stable AROUND 10. - q8hr H&H.  - GI on board.  - c diff is negative. Gi pathogen negative.   2. Abnormal CT abd pelvis showed thickened bladder wall: - urology consulted for cystoscopy. Recommend outpatient follow up.    3. Hypotension; possibly from the anesthesia.  - resolved with IV fluids. Her BP normal today.   4. UTI: - cultures pending show citrobacter sensitive to ciprofloxacin. Iv rocephin discontinued and ciprofloxacin for 5 more days to complete the course. .   5. DVT prophylaxis.   6. Chronic diarrhea: she underwent colonoscopy few months ago and was told that she has colitis. She would like to speaK to Dr Juanda Chance for further questions.   Code Status: full code Family Communication: multiple family members on the bedside. Discussed the plan of care witht he  daughter and patient at bedside Disposition Plan: pending PT evaluation.    Consultants: [place] [facility] U[facility]  Procedures:  EGD on 11/29:   Antibiotics:  Rocephin 11/29  HPI/Subjective: No new complaints.  Comfortable. One BM this am, no nausea or vomiting, feeling better. Advanced full liquid diet to soft diet.   Objective: Filed Vitals:   03/01/13 0511  BP: 130/57  Pulse: 73  Temp: 98 F (36.7 C)  Resp: 18    Intake/Output Summary (Last 24 hours) at 03/01/13 0939 Last data filed at 03/01/13 0500  Gross per 24 hour  Intake    800 ml  Output      5 ml  Net    795 ml   Filed Weights   02/26/13 1159  Weight: 48.988 kg (108 lb)    Exam:   General:  Sleeping, comfortable  Cardiovascular: s1s2  Respiratory: ctab  Abdomen: soft mildly tender, ND BS+  Musculoskeletal: No pedal edema  Data Reviewed: Basic Metabolic Panel:  Recent Labs Lab 02/26/13 1415  NA 138  K 3.7  CL 101  CO2 26  GLUCOSE 99  BUN 36*  CREATININE 0.95  CALCIUM 9.6   Liver Function Tests:  Recent Labs Lab 02/26/13 1415  AST 14  ALT 9  ALKPHOS 66  BILITOT 0.2*  PROT 7.4  ALBUMIN 3.7    Recent Labs Lab 02/26/13 1415  LIPASE 27   No results found for this basename: AMMONIA,  in the last 168 hours CBC:  Recent Labs Lab 02/26/13 1415 02/26/13 2350  02/27/13 0445 02/27/13 0940 02/27/13 1310 02/28/13 0200 02/28/13 1228 02/28/13 1700 03/01/13 0120  WBC 6.5 7.0 5.4 4.4 6.5  --   --   --   --   NEUTROABS 4.3  --   --   --   --   --   --   --   --   HGB 9.9* 8.3* 7.7* 7.6* 7.6* 10.3* 10.4* 10.4* 9.9*  HCT 29.7* 24.5* 22.6* 22.4* 22.0* 28.4* 29.0* 28.8* 28.4*  MCV 90.3 89.1 88.3 89.2 88.4  --   --   --   --   PLT 289 238 220 216 205  --   --   --   --    Cardiac Enzymes:  Recent Labs Lab 02/26/13 1415  TROPONINI <0.30   BNP (last 3 results) No results found for this basename: PROBNP,  in the last 8760 hours CBG: No results found for this  basename: GLUCAP,  in the last 168 hours  Recent Results (from the past 240 hour(s))  URINE CULTURE     Status: None   Collection Time    02/26/13  3:00 PM      Result Value Range Status   Specimen Description URINE, CATHETERIZED   Final   Special Requests NONE   Final   Culture  Setup Time     Final   Value: 02/27/2013 00:33     Performed at [facility] Count     Final   Value: >=100,000 COLONIES/ML     Performed at [facility]   Culture     Final   Value: CITROBACTER FREUNDII     Performed at [facility]   Report Status 03/01/2013 FINAL   Final   Organism ID, Bacteria CITROBACTER FREUNDII   Final  CLOSTRIDIUM DIFFICILE BY PCR     Status: None   Collection Time    02/26/13  5:04 PM      Result Value Range Status   C difficile by pcr NEGATIVE  NEGATIVE Final     Studies: No results found.  Scheduled Meds: . ciprofloxacin  500 mg Oral BID  . hyoscyamine  0.25 mg Oral TID AC & HS  . [START ON 03/02/2013] pantoprazole (PROTONIX) IV  40 mg Intravenous Q12H   Continuous Infusions: . pantoprozole (PROTONIX) infusion 8 mg/hr (03/01/13 0517)    Principal Problem:   GIB (gastrointestinal bleeding) Active Problems:   Acute blood loss anemia   UTI (lower urinary tract infection)   Hypertension   Acute duodenal ulcer with hemorrhage, without mention of obstruction   Diarrhea   Loss of weight    Time spent: 25 MIN    Renee Blake  [facility] Pager [phone]. If 7PM-7AM, please contact night-coverage at [email or web address], password [facility] 03/01/2013, 9:39 AM  LOS: 3 days

## 2013-03-01 NOTE — Progress Notes (Signed)
INITIAL NUTRITION ASSESSMENT  DOCUMENTATION CODES Per approved criteria  -Non-severe (moderate) malnutrition in the context of chronic illness   INTERVENTION: 1.  General healthful diet; encourage intake of foods and beverages as able.  RD to follow and assess for nutritional adequacy.   NUTRITION DIAGNOSIS: Unintended wt loss related to poor appetite, diarrhea as evidenced by 50 lbs reported wt loss.   Monitor:  1.  Food/Beverage; pt meeting >/=90% estimated needs with tolerance. 2.  Wt/wt change; monitor trends  Reason for Assessment: Low BMI  79 y.o. female  Admitting Dx: GIB (gastrointestinal bleeding)  ASSESSMENT: Pt admitted with GIB. Pt reports diarrhea for years.  States the diarrhea started when she was 79 years old.  It was on/off for years, but has been consistent for the past 1 year.  Pt states she can't get out much because she has to stay near bathroom.  Pt states diarrhea occurs up to 3 times daily.  She will sometimes go a day without a BM.  She is unable to identify a source- thinks maybe spaghetti. Pt underwent endoscopy and colonoscopy. Note PA request for limited concentrated sweets.  Pt with Sprite at bedside. Instructed on choosing diet sodas for now.  Pt reports a 50 lbs wt loss in the past 1 year.  Per chart review she has lost 12 lbs in the past 6 months (10% usual wt in 6 months).  RD to follow for ongoing diagnostics and appropriate interventions.  No supplements at this time due to sugar content.  Nutrition Focused Physical Exam: Subcutaneous Fat:  Orbital Region: WNL Upper Arm Region: mild wasting Thoracic and Lumbar Region: WNL  Muscle:  Temple Region: mild wasting Clavicle Bone Region: WNL Clavicle and Acromion Bone Region: WNL Scapular Bone Region: WNL Dorsal Hand: mild wasting Patellar Region: mild wasting Anterior Thigh Region: moderate wasting Posterior Calf Region: moderate wasting  Edema: none present  Pt meets criteria for  moderate MALNUTRITION in the context of chronic illness as evidenced by moderate muscle wasting, 10% wt loss in 6 months.  Height: Ht Readings from Last 1 Encounters:  02/26/13 5\' 6"  (1.676 m)    Weight: Wt Readings from Last 1 Encounters:  02/26/13 108 lb (48.988 kg)    Ideal Body Weight: 130 lbs  % Ideal Body Weight: 83%  Wt Readings from Last 10 Encounters:  02/26/13 108 lb (48.988 kg)  02/26/13 108 lb (48.988 kg)  08/24/12 120 lb (54.432 kg)    Usual Body Weight: 150 lbs per pt  % Usual Body Weight: 72%  BMI:  Body mass index is 17.44 kg/(m^2).  Estimated Nutritional Needs: Kcal: 1380-1570 Protein: 60-70g Fluid: >1.5 L/day  Skin: intact  Diet Order: Dysphagia 3, thin  EDUCATION NEEDS: -Education needs addressed   Intake/Output Summary (Last 24 hours) at 03/01/13 1531 Last data filed at 03/01/13 1515  Gross per 24 hour  Intake    840 ml  Output      7 ml  Net    833 ml    Last BM: 11/30  Labs:   Recent Labs Lab 02/26/13 1415 02/27/13 0445  NA 138 140  K 3.7 3.6  CL 101 110  CO2 26 21  BUN 36* 29*  CREATININE 0.95 0.87  CALCIUM 9.6 8.5  GLUCOSE 99 89    CBG (last 3)  No results found for this basename: GLUCAP,  in the last 72 hours  Scheduled Meds: . ciprofloxacin  500 mg Oral BID  . hyoscyamine  0.25 mg  Oral TID AC & HS  . loperamide  2 mg Oral BID  . [START ON 03/02/2013] pantoprazole  40 mg Oral BID    Continuous Infusions: . pantoprozole (PROTONIX) infusion 8 mg/hr (03/01/13 1400)    Past Medical History  Diagnosis Date  . Hypertension   . Hyperlipemia   . History of stomach ulcers     1980s or 1990s  . Skin cancer   . Chronic diarrhea   . Vertigo     Past Surgical History  Procedure Laterality Date  . Nose surgery      for skin cancer  . Esophagogastroduodenoscopy N/A 02/27/2013    Procedure: ESOPHAGOGASTRODUODENOSCOPY (EGD);  Surgeon: Meryl Dare, MD;  Location: [facility] ENDOSCOPY;  Service: Endoscopy;  Laterality:  N/A;  . Hot hemostasis N/A 02/27/2013    Procedure: HOT HEMOSTASIS (ARGON PLASMA COAGULATION/BICAP);  Surgeon: Meryl Dare, MD;  Location: [facility] ENDOSCOPY;  Service: Endoscopy;  Laterality: N/A;    Loyce Dys, MS RD LDN Clinical Inpatient Dietitian Pager: [phone] Weekend/After hours pager: [phone]

## 2013-03-02 LAB — CBC
HCT: 27 % — ABNORMAL LOW (ref 36.0–46.0)
Hemoglobin: 9.7 g/dL — ABNORMAL LOW (ref 12.0–15.0)
MCH: 30.5 pg (ref 26.0–34.0)
MCHC: 35.9 g/dL (ref 30.0–36.0)
MCV: 84.9 fL (ref 78.0–100.0)
Platelets: 215 10*3/uL (ref 150–400)
RBC: 3.18 MIL/uL — ABNORMAL LOW (ref 3.87–5.11)
RDW: 14.1 % (ref 11.5–15.5)
WBC: 5.9 10*3/uL (ref 4.0–10.5)

## 2013-03-02 MED ORDER — PREDNISONE 5 MG PO TABS: 5.0000 mg | ORAL_TABLET | Freq: Every day | ORAL | Status: AC

## 2013-03-02 MED ORDER — PREDNISONE 10 MG PO TABS: 10.0000 mg | ORAL_TABLET | Freq: Every day | ORAL | Status: AC

## 2013-03-02 MED ORDER — CIPROFLOXACIN HCL 500 MG PO TABS: 500.0000 mg | ORAL_TABLET | Freq: Two times a day (BID) | ORAL | Status: DC

## 2013-03-02 MED ORDER — PANTOPRAZOLE SODIUM 40 MG PO TBEC: 40.0000 mg | DELAYED_RELEASE_TABLET | Freq: Two times a day (BID) | ORAL | Status: DC

## 2013-03-02 MED ORDER — TRAMADOL HCL 50 MG PO TABS
100.0000 mg | ORAL_TABLET | Freq: Four times a day (QID) | ORAL | Status: DC | PRN
  Administered 2013-03-02: 100 mg via ORAL
  Filled 2013-03-02: qty 2

## 2013-03-02 MED ORDER — PREDNISONE 5 MG PO TABS: 15.0000 mg | ORAL_TABLET | Freq: Every day | ORAL | Status: AC

## 2013-03-02 MED ORDER — LISINOPRIL 2.5 MG PO TABS
2.5000 mg | ORAL_TABLET | Freq: Every day | ORAL | Status: DC
  Administered 2013-03-03: 2.5 mg via ORAL
  Filled 2013-03-02: qty 1

## 2013-03-02 MED ORDER — ATENOLOL 50 MG PO TABS
50.0000 mg | ORAL_TABLET | Freq: Two times a day (BID) | ORAL | Status: DC
  Administered 2013-03-02 – 2013-03-03 (×2): 50 mg via ORAL
  Filled 2013-03-02 (×3): qty 1

## 2013-03-02 NOTE — Discharge Summary (Signed)
Physician Discharge Summary  Renee Blake ZOX:[id number] DOB: [birth year] DOA: 02/26/2013  PCP: Ernestine Conrad, MD  Admit date: 02/26/2013 Discharge date: 03/02/2013  Time spent: 30 minutes  Recommendations for Outpatient Follow-up:  1. Follow up with PCP in one week 2. Follow up with Dr Juanda Chance as recommended 3. Follow up with Dr Margarita Grizzle as recommended 4. Follow up with Gynecology as needed 5. Follow up with home health PT.  6. Please check cbc in one week at PCP office.   Discharge Diagnoses:  Principal Problem:   GIB (gastrointestinal bleeding) Active Problems:   Acute blood loss anemia   UTI (lower urinary tract infection)   Hypertension   Acute duodenal ulcer with hemorrhage, without mention of obstruction   Diarrhea   Loss of weight   Malnutrition of moderate degree Thickened bladder.    Discharge Condition: improved.   Diet recommendation: low sodium diet.  Filed Weights   02/26/13 1159  Weight: 48.988 kg (108 lb)    History of present illness:   79 year old woman presented to the emergency department with a history of dark, tarry stools and dizziness. While in the emergency department she had dark stool and was fecal occult positive. She was referred for further evaluation of GI bleed. There is no GI coverage this weekend at [facility] and after discussion with family, plan was made to transfer to [facility] for further evaluation. GI consulted and she underwent EGD on 11/29 , was found to have duodenal ulcer and actively bleeding, which was cauterized with epi. She was later on transferred tot he floor. 2 units of prbc transfusion ordered. She is continued on PPI drip. Her CT abd and pelvis revealed thickening of the bladder wall, concern for infiltrating bladder wall mass, urology consulted for cystoscopy. As per discussion with the daughter, she was also found to have vaginal vault prolapse, recommended outpatient gyn follow up.   Hospital Course:  1. Actively bleeding  duodenal ulcer. : - s/p EGD by GI, on 11/29 on IV PPI for 72 hours and she  Is on BID PPI for 8 weeks followed by PPI daily. She is on solids and if able to tolerate without symptoms can be discharged. .  - 2 units of PRBC transfusion, Her H&H is stable AROUND 10.  - outpatient follow up with Dr Juanda Chance as recommended.  2. Abnormal CT abd pelvis showed thickened bladder wall:  - urology consulted for cystoscopy. Recommend outpatient follow up.  3. Hypotension; possibly from the anesthesia.  - resolved with IV fluids. Her BP normal today.  4. UTI:  - cultures pending show citrobacter sensitive to ciprofloxacin. Iv rocephin discontinued and ciprofloxacin for 5 more days to complete the course. .   6. Chronic diarrhea: she underwent colonoscopy few months ago and was told that she has microscopic colitis. She was started on Imodium and prednisone and her diarrhea improved. Please do a quick prednisone taper on discharge.   Procedures: EGD on 11/29:    Consultations:  Gastroenterology  Urology  Physical therapy  Discharge Exam: Filed Vitals:   03/02/13 1320  BP: 151/74  Pulse: 90  Temp: 98 F (36.7 C)  Resp: 18    General: alert afebrile comfortable Cardiovascular: s1s2 Respiratory: ctab  Discharge Instructions     Medication List    STOP taking these medications       omeprazole 20 MG capsule  Commonly known as:  PRILOSEC     triamterene-hydrochlorothiazide 37.5-25 MG per tablet  Commonly known as:  MAXZIDE-25      TAKE these medications       acetaminophen 500 MG tablet  Commonly known as:  TYLENOL  Take 1,000 mg by mouth daily as needed for headache.     atenolol 50 MG tablet  Commonly known as:  TENORMIN  Take 50 mg by mouth 2 (two) times daily.     ciprofloxacin 500 MG tablet  Commonly known as:  CIPRO  Take 1 tablet (500 mg total) by mouth 2 (two) times daily.     EYE DROPS OP  Apply 2 drops to eye daily as needed (dry eyes).     lisinopril 2.5  MG tablet  Commonly known as:  PRINIVIL,ZESTRIL  Take 2.5 mg by mouth daily.     loperamide 2 MG tablet  Commonly known as:  IMODIUM A-D  Take 4 mg by mouth daily as needed for diarrhea or loose stools.     meclizine 25 MG tablet  Commonly known as:  ANTIVERT  Take 25 mg by mouth 2 (two) times daily as needed for dizziness (VERTIGO).     pantoprazole 40 MG tablet  Commonly known as:  PROTONIX  Take 1 tablet (40 mg total) by mouth 2 (two) times daily.     potassium chloride 10 MEQ tablet  Commonly known as:  K-DUR  Take 10-20 mEq by mouth 2 (two) times daily. Takes two in the morning and one in the evening     predniSONE 5 MG tablet  Commonly known as:  DELTASONE  Take 3 tablets (15 mg total) by mouth daily with breakfast.  Start taking on:  03/05/2013     predniSONE 10 MG tablet  Commonly known as:  DELTASONE  Take 1 tablet (10 mg total) by mouth daily with breakfast.  Start taking on:  03/08/2013     predniSONE 5 MG tablet  Commonly known as:  DELTASONE  Take 1 tablet (5 mg total) by mouth daily with breakfast.  Start taking on:  03/11/2013     traMADol 50 MG tablet  Commonly known as:  ULTRAM  Take 50 mg by mouth 4 (four) times daily.       Allergies  Allergen Reactions  . Codeine Other (See Comments)    Reaction unknown  . Sulfa Antibiotics Other (See Comments)    Reactions unknown   Follow-up Information   Call Milford Cage, MD. (Call when you get out of the hospital for an appointment in 2-3 weeks.)    Specialty:  Urology   Contact information:   [address] [place] FLOOR [address] [place] [address] [phone]       Schedule an appointment as soon as possible for a visit with Lyda Kalata, MD. (he will follow up the diarrhea and ulcers)    Specialty:  Internal Medicine   Contact information:   [address] [place] [address] [phone]       Follow up with Ernestine Conrad, MD. Schedule an  appointment as soon as possible for a visit in 1 week.   Specialty:  Family Medicine   Contact information:   [facility] [address] [place] [address] [phone]       Follow up with Milford Cage, MD. Schedule an appointment as soon as possible for a visit in 1 week.   Specialty:  Urology   Contact information:   [address] [place]  [address] [place] [address] [phone]  The results of significant diagnostics from this hospitalization (including imaging, microbiology, ancillary and laboratory) are listed below for reference.    Significant Diagnostic Studies: Dg Chest 2 View  02/26/2013   CLINICAL DATA:  Rectal bleeding.  Hypertension.  EXAM: CHEST  2 VIEW  COMPARISON:  08/24/2012.  FINDINGS: Again noted is apical pleural thickening consistent with scarring. Lungs are clear of acute infiltrates. No pleural effusion or pneumothorax. Heart size and pulmonary vascularity normal. Degenerative changes both shoulders. Degenerative changes thoracic spine.  IMPRESSION: No active cardiopulmonary disease.   Electronically Signed   By: Maisie Fus  Register   On: 02/26/2013 14:54   Ct Abdomen Pelvis W Contrast  02/26/2013   CLINICAL DATA:  Rectal bleeding, abdominal pain.  EXAM: CT ABDOMEN AND PELVIS WITH CONTRAST  TECHNIQUE: Multidetector CT imaging of the abdomen and pelvis was performed using the standard protocol following bolus administration of intravenous contrast.  CONTRAST:  50mL OMNIPAQUE IOHEXOL 300 MG/ML SOLN, OMNIPAQUE IOHEXOL 300 MG/ML SOLN  COMPARISON:  08/24/2012  FINDINGS: Lingular and right middle lobe scarring. Lung bases otherwise clear. No effusions. Heart is normal size.  Tiny hypodensity in the dome of the liver, stable, likely small cyst. Stable slight intrahepatic ductal dilatation. Borderline size of the common bile duct and pancreatic duct. Small hypervascular areas in the lower pole of the spleen, likely flash filling of  hemangiomas. Mild atrophy of the pancreas. No focal lesion. Adrenals are unremarkable.  Bilateral renal cortical thinning and areas of scarring. Small cyst in the upper pole of the right kidney. Small nonobstructing stone in the lower pole of the right kidney. No hydronephrosis or hydroureter. Urinary bladder is grossly unremarkable.  Again noted is bladder wall thickening along the posterior and inferior bladder wall. This is slightly more pronounced than prior study. Cannot exclude infiltrative bladder wall process.  Uterus and adnexa unremarkable. Small bowel is decompressed. Few scattered colonic diverticula. No active diverticulitis. Large bowel otherwise unremarkable. Stomach unremarkable. Small hiatal hernia.  No acute bony abnormality. Leftward scoliosis and degenerative changes throughout the thoracolumbar spine.  IMPRESSION: Apparent increase in bladder wall thickening along the posterior and inferior bladder walls. Cannot exclude infiltrating bladder wall mass. Recommend clinical correlation for hematuria and possible cystoscopy.  Stable mild biliary and pancreatic ductal dilatation of questionable significance. Recommend correlation with laboratory values.  Right lower pole nephrolithiasis. Renal cortical thinning and scarring.   Electronically Signed   By: Charlett Nose M.D.   On: 02/26/2013 16:05    Microbiology: Recent Results (from the past 240 hour(s))  URINE CULTURE     Status: None   Collection Time    02/26/13  3:00 PM      Result Value Range Status   Specimen Description URINE, CATHETERIZED   Final   Special Requests NONE   Final   Culture  Setup Time     Final   Value: 02/27/2013 00:33     Performed at [facility] Count     Final   Value: >=100,000 COLONIES/ML     Performed at [facility]   Culture     Final   Value: CITROBACTER FREUNDII     Performed at [facility]   Report Status 03/01/2013 FINAL   Final   Organism ID, Bacteria  CITROBACTER FREUNDII   Final  CLOSTRIDIUM DIFFICILE BY PCR     Status: None   Collection Time    02/26/13  5:04 PM      Result Value  Range Status   C difficile by pcr NEGATIVE  NEGATIVE Final     Labs: Basic Metabolic Panel:  Recent Labs Lab 02/26/13 1415 02/27/13 0445  NA 138 140  K 3.7 3.6  CL 101 110  CO2 26 21  GLUCOSE 99 89  BUN 36* 29*  CREATININE 0.95 0.87  CALCIUM 9.6 8.5   Liver Function Tests:  Recent Labs Lab 02/26/13 1415  AST 14  ALT 9  ALKPHOS 66  BILITOT 0.2*  PROT 7.4  ALBUMIN 3.7    Recent Labs Lab 02/26/13 1415  LIPASE 27   No results found for this basename: AMMONIA,  in the last 168 hours CBC:  Recent Labs Lab 02/26/13 1415 02/26/13 2350 02/27/13 0445 02/27/13 0940 02/27/13 1310  02/28/13 1228 02/28/13 1700 03/01/13 0120 03/01/13 1008 03/02/13 0652  WBC 6.5 7.0 5.4 4.4 6.5  --   --   --   --   --  5.9  NEUTROABS 4.3  --   --   --   --   --   --   --   --   --   --   HGB 9.9* 8.3* 7.7* 7.6* 7.6*  < > 10.4* 10.4* 9.9* 11.3* 9.7*  HCT 29.7* 24.5* 22.6* 22.4* 22.0*  < > 29.0* 28.8* 28.4* 30.9* 27.0*  MCV 90.3 89.1 88.3 89.2 88.4  --   --   --   --   --  84.9  PLT 289 238 220 216 205  --   --   --   --   --  215  < > = values in this interval not displayed. Cardiac Enzymes:  Recent Labs Lab 02/26/13 1415  TROPONINI <0.30   BNP: BNP (last 3 results) No results found for this basename: PROBNP,  in the last 8760 hours CBG: No results found for this basename: GLUCAP,  in the last 168 hours     Signed:  Thersia Petraglia  [facility] 03/02/2013, 4:33 PM

## 2013-03-02 NOTE — Evaluation (Signed)
Physical Therapy Evaluation Patient Details Name: Renee Blake MRN: [id number] DOB: 15-Apr-1928 Today's Date: 03/02/2013 Time: 7829-5621 PT Time Calculation (min): 17 min  PT Assessment / Plan / Recommendation History of Present Illness  Renee Blake is a 79 y.o. female presented to the emergency department at [facility] with a history of dark, tarry stools and dizziness/weakness for the past one week. While in the emergency department she had dark stool and was fecal occult positive.  Hgb this AM is 7.7 grams.  Clinical Impression  Patient presents with decreased independence and safety with mobility due to the deficits listed below.  She will benefit from skilled PT in the acute setting to maximize independence prior to d/c home with family assist.  Patient prefers family to assist her at home politely declining HHPT and walker use.     PT Assessment  Patient needs continued PT services    Follow Up Recommendations  No PT follow up (would benefit from HHPT, but pt refuses)    Does the patient have the potential to tolerate intense rehabilitation    N/A  Barriers to Discharge  None      Equipment Recommendations  None recommended by PT (would benefit from walker, but pt refuses)    Recommendations for Other Services   None  Frequency Min 3X/week    Precautions / Restrictions Precautions Precautions: Fall   Pertinent Vitals/Pain Denies pain; BP 167/66 standing, HR and SpO2 WNL walking on room air      Mobility  Bed Mobility Bed Mobility: Supine to Sit;Sit to Supine Supine to Sit: 5: Supervision;HOB elevated Sit to Supine: 5: Supervision;HOB elevated Details for Bed Mobility Assistance: assist for safety Transfers Transfers: Sit to Stand;Stand to Sit Sit to Stand: 4: Min guard;From bed Stand to Sit: 4: Min guard;To bed Details for Transfer Assistance: assist to steady Ambulation/Gait Ambulation/Gait Assistance: 4: Min assist Ambulation Distance (Feet): 150  Feet Assistive device: None Ambulation/Gait Assistance Details: pt unsteady and needs assist for balance/weakness; reluctant to use walker  Gait Pattern: Step-through pattern;Decreased stride length;Decreased trunk rotation    Exercises Other Exercises Other Exercises: briefly educated pt on fall prevention with lighting, footwear and clear pathways   PT Diagnosis: Abnormality of gait  PT Problem List: Decreased strength;Decreased balance;Decreased mobility;Decreased knowledge of use of DME PT Treatment Interventions: DME instruction;Balance training;Gait training;Functional mobility training;Patient/family education;Therapeutic exercise;Therapeutic activities     PT Goals(Current goals can be found in the care plan section) Acute Rehab PT Goals Patient Stated Goal: To return to independent PT Goal Formulation: With patient Time For Goal Achievement: 03/09/13 Potential to Achieve Goals: Good  Visit Information  Last PT Received On: 03/02/13 Assistance Needed: +1 History of Present Illness: Renee Blake is a 79 y.o. female presented to the emergency department at [facility] with a history of dark, tarry stools and dizziness/weakness for the past one week. While in the emergency department she had dark stool and was fecal occult positive.  Hgb this AM is 7.7 grams.       Prior Functioning  Home Living Family/patient expects to be discharged to:: Private residence Living Arrangements: Children Available Help at Discharge: Family;Available 24 hours/day Type of Home: Apartment Home Access: Stairs to enter Entrance Stairs-Number of Steps: 2 Home Layout: One level Home Equipment: None Additional Comments: Lives with daughter and grandaughter and has other children local who can assist as well Prior Function Level of Independence: Independent Comments: participates in small tasks around house Communication Communication: No difficulties Dominant Hand:  Right    Cognition   Cognition Arousal/Alertness: Awake/alert Behavior During Therapy: WFL for tasks assessed/performed Overall Cognitive Status: Within Functional Limits for tasks assessed    Extremity/Trunk Assessment Lower Extremity Assessment Lower Extremity Assessment: Generalized weakness   Balance Balance Balance Assessed: Yes Static Standing Balance Static Standing - Balance Support: No upper extremity supported Static Standing - Level of Assistance: 4: Min assist  End of Session PT - End of Session Equipment Utilized During Treatment: Gait belt Activity Tolerance: Patient tolerated treatment well Patient left: in bed;with call bell/phone within reach  GP     [facility] 03/02/2013, 3:10 PM [place], [place] [phone] 03/02/2013

## 2013-03-02 NOTE — Progress Notes (Signed)
Daily Rounding Note  03/02/2013, 8:42 AM  LOS: 4 days   SUBJECTIVE:       Less diarrhea.  Stools brown, not black as on admission.  Feels well, no problems eating solid foods.  Has not been walking in halls yet.    OBJECTIVE:         Vital signs in last 24 hours:    Temp:  [98.1 F (36.7 C)-98.5 F (36.9 C)] 98.5 F (36.9 C) (12/02 0604) Pulse Rate:  [72-76] 72 (12/02 0604) Resp:  [16-20] 20 (12/02 0604) BP: (139-149)/(53-63) 143/62 mmHg (12/02 0604) SpO2:  [98 %-100 %] 98 % (12/02 0604) Last BM Date: 03/01/13 General: comfortable, looks well.    Heart: RRR.  No MRG Chest: clear bil.  Breathing easily Abdomen: soft, NT, ND.  No mass or HSM  Extremities: no CCE Neuro/Psych:  Pleasant, anxious.  Not confused, disoriented or agitated.   Moves all 4s.  Intake/Output from previous day: 12/01 0701 - 12/02 0700 In: 1080 [P.O.:1080] Out: 6 [Urine:4; Stool:2]  Intake/Output this shift:    Lab Results:  Recent Labs  02/27/13 0940 02/27/13 1310  03/01/13 0120 03/01/13 1008 03/02/13 0652  WBC 4.4 6.5  --   --   --  5.9  HGB 7.6* 7.6*  < > 9.9* 11.3* 9.7*  HCT 22.4* 22.0*  < > 28.4* 30.9* 27.0*  PLT 216 205  --   --   --  215  < > = values in this interval not displayed.   ASSESMENT:   *UGIB.  EGD 11/29: 1. Duodenal ulcer, actively bleeding; Bipolar (BICAP) cautery and  epi injection; complete hemostasis achieved 2. Duodenal bulb stenosis and deformity  3. Stricture at the gastroesophageal junction 4. Small hiatal hernia  These possibly from Goody's (not using a lot). On OTC Prilosec at home.  Protonix drip completed.  Begins BID Protonix today.  Serum H Pylori Ab is <0.40, which is negative.   * ABL anemia. S/p 2 units PRBCs 11/29. Good response to transfusions. The Hgb jump from 9.9 to 11.3 without any intervening transfusion, and then decline back to 9.7 raises suspicion of lab error.  Her Hgb is stable.     * Weight loss, may be secondary to ulcers and the diarrhea. . Stable mild biliary and PD ductal dilaltion of questionable significance and pancreatic atrophy per CT report.   * Microscopic colitis  Per 10/2012 colonoscopy.  Untreated as she never followed up with GI.  Started on BID imodium 03/01/13.   *  Citrobacter UTI: On Cipro, bladder thickened on CT scan.   *  Headaches.       PLAN   * Stop Goodies. In future ok to use 81 mg ASA if required.  Use only Acetaminophen or narcotics for headaches.  * If H Pylori +, add treatment.  * 72 hours of Protonix drip, then begin 8 weeks BID PPI (starts tomorrow AM), then once daily.  * continue Imodium 1 to 4 x per day prn, titrated to control loose BMs.  If diarrhea not controlled with this, can use Budesonide but this is expensive and adds possible steroid side effects (though less so than prednisone). * Continue solid foods.  * Follow up with Dr Teena Dunk her GI, or with Dr Ernestine Conrad, her PMD.      Jennye Moccasin  03/02/2013, 8:42 AM Pager: [phone] Attending MD note:   I have reviewed the above note, examined the  patient and agree with plan of treatment.She was started on prednisone 20 mg yesterday, no diarrhea today. S/P UGIB  due to DU, starts on oral PPI. Prednisone taper as per orders, decrease by 5 mg every 3 days. OK to d/c tomorrow from GI standpoint.  [facility] Pager # [phone]

## 2013-03-03 LAB — GLUCOSE, CAPILLARY: Glucose-Capillary: 91 mg/dL (ref 70–99)

## 2013-03-03 MED ORDER — HYOSCYAMINE SULFATE 0.125 MG PO TBDP
0.1250 mg | ORAL_TABLET | Freq: Three times a day (TID) | ORAL | Status: DC
  Filled 2013-03-03 (×4): qty 1

## 2013-03-03 NOTE — Progress Notes (Signed)
Patient seen and examined. Discussed with family members at bedside. Hb has remained stable overnight. GI has agreed to DC today. Will plan for DC home today. HHPT will be arranged per PT recommendations. No changes to DC Summary as per Dr. Blake Divine  Dictated on 03/02/13.  Peggye Pitt, MD [facility] Pager: [phone]

## 2013-03-03 NOTE — Progress Notes (Signed)
Diarrhea has subsided, i formed stool today, pt has no complaints. Hgb 9.7  On 03/02/2013, no further bleeding from the Duodenal ulcer.  OK to discharge from GI standpoint.

## 2013-03-03 NOTE — Care Management Note (Signed)
  Page 1 of 1   03/03/2013     2:57:14 PM   CARE MANAGEMENT NOTE 03/03/2013  Patient:  Renee Blake, Renee Blake   Account Number:  [id number]  Date Initiated:  03/03/2013  Documentation initiated by:  Ronny Flurry  Subjective/Objective Assessment:     Action/Plan:   Anticipated DC Date:     Anticipated DC Plan:  HOME W HOME HEALTH SERVICES         Choice offered to / List presented to:  C-1 Patient        HH arranged  HH-2 PT      [facility] agency  [facility]   Status of service:  Completed, signed off Medicare Important Message given?   (If response is "NO", the following Medicare IM given date fields will be blank) Date Medicare IM given:   Date Additional Medicare IM given:    Discharge Disposition:  HOME/SELF CARE  Per UR Regulation:    If discussed at Long Length of Stay Meetings, dates discussed:    Comments:  03-03-13 Patient has agreed to HHPT , does not want the walker. Son at bedside and aware. Facesheet information confirmed with patient and son. Ronny Flurry RN BSn [phone]   03-03-13 Received orders for HHPT and PT recommends walker . Spoke with patient and family members at bedside . Patient refusing home health and a walker. Ronny Flurry RN BSN [phone]

## 2013-03-23 ENCOUNTER — Telehealth: Payer: Self-pay | Admitting: Gastroenterology

## 2013-03-26 ENCOUNTER — Encounter (HOSPITAL_COMMUNITY): Payer: Self-pay | Admitting: Emergency Medicine

## 2013-03-26 ENCOUNTER — Inpatient Hospital Stay (HOSPITAL_COMMUNITY)
Admission: EM | Admit: 2013-03-26 | Discharge: 2013-03-31 | DRG: 378 | Disposition: A | Discharge status: Home Health | Payer: Medicare Other | Attending: Family Medicine | Admitting: Family Medicine

## 2013-03-26 DIAGNOSIS — Z79899 Other long term (current) drug therapy: Secondary | ICD-10-CM

## 2013-03-26 DIAGNOSIS — Z8711 Personal history of peptic ulcer disease: Secondary | ICD-10-CM

## 2013-03-26 DIAGNOSIS — Z8249 Family history of ischemic heart disease and other diseases of the circulatory system: Secondary | ICD-10-CM

## 2013-03-26 DIAGNOSIS — D72829 Elevated white blood cell count, unspecified: Secondary | ICD-10-CM | POA: Diagnosis present

## 2013-03-26 DIAGNOSIS — R197 Diarrhea, unspecified: Secondary | ICD-10-CM

## 2013-03-26 DIAGNOSIS — R55 Syncope and collapse: Secondary | ICD-10-CM

## 2013-03-26 DIAGNOSIS — Z85828 Personal history of other malignant neoplasm of skin: Secondary | ICD-10-CM

## 2013-03-26 DIAGNOSIS — N179 Acute kidney failure, unspecified: Secondary | ICD-10-CM | POA: Diagnosis present

## 2013-03-26 DIAGNOSIS — R531 Weakness: Secondary | ICD-10-CM

## 2013-03-26 DIAGNOSIS — K449 Diaphragmatic hernia without obstruction or gangrene: Secondary | ICD-10-CM | POA: Diagnosis present

## 2013-03-26 DIAGNOSIS — I10 Essential (primary) hypertension: Secondary | ICD-10-CM

## 2013-03-26 DIAGNOSIS — K222 Esophageal obstruction: Secondary | ICD-10-CM | POA: Diagnosis present

## 2013-03-26 DIAGNOSIS — K921 Melena: Secondary | ICD-10-CM

## 2013-03-26 DIAGNOSIS — E860 Dehydration: Secondary | ICD-10-CM

## 2013-03-26 DIAGNOSIS — K625 Hemorrhage of anus and rectum: Secondary | ICD-10-CM | POA: Diagnosis present

## 2013-03-26 DIAGNOSIS — E785 Hyperlipidemia, unspecified: Secondary | ICD-10-CM | POA: Diagnosis present

## 2013-03-26 DIAGNOSIS — D62 Acute posthemorrhagic anemia: Secondary | ICD-10-CM

## 2013-03-26 DIAGNOSIS — R5381 Other malaise: Secondary | ICD-10-CM

## 2013-03-26 DIAGNOSIS — K922 Gastrointestinal hemorrhage, unspecified: Secondary | ICD-10-CM

## 2013-03-26 LAB — CBC WITH DIFFERENTIAL/PLATELET
Basophils Absolute: 0 10*3/uL (ref 0.0–0.1)
Basophils Relative: 0 % (ref 0–1)
Eosinophils Absolute: 0.1 10*3/uL (ref 0.0–0.7)
Eosinophils Relative: 1 % (ref 0–5)
HCT: 38 % (ref 36.0–46.0)
Hemoglobin: 12.6 g/dL (ref 12.0–15.0)
Lymphocytes Relative: 8 % — ABNORMAL LOW (ref 12–46)
Lymphs Abs: 1 10*3/uL (ref 0.7–4.0)
MCH: 29.6 pg (ref 26.0–34.0)
MCHC: 33.2 g/dL (ref 30.0–36.0)
MCV: 89.4 fL (ref 78.0–100.0)
Monocytes Absolute: 1.2 10*3/uL — ABNORMAL HIGH (ref 0.1–1.0)
Monocytes Relative: 10 % (ref 3–12)
Neutro Abs: 10.2 10*3/uL — ABNORMAL HIGH (ref 1.7–7.7)
Neutrophils Relative %: 82 % — ABNORMAL HIGH (ref 43–77)
Platelets: 307 10*3/uL (ref 150–400)
RBC: 4.25 MIL/uL (ref 3.87–5.11)
RDW: 14.2 % (ref 11.5–15.5)
WBC: 12.5 10*3/uL — ABNORMAL HIGH (ref 4.0–10.5)

## 2013-03-26 LAB — COMPREHENSIVE METABOLIC PANEL
ALT: 9 U/L (ref 0–35)
AST: 15 U/L (ref 0–37)
Albumin: 3.9 g/dL (ref 3.5–5.2)
Alkaline Phosphatase: 71 U/L (ref 39–117)
BUN: 17 mg/dL (ref 6–23)
CO2: 26 mEq/L (ref 19–32)
Calcium: 10 mg/dL (ref 8.4–10.5)
Chloride: 101 mEq/L (ref 96–112)
Creatinine, Ser: 0.85 mg/dL (ref 0.50–1.10)
GFR calc Af Amer: 72 mL/min — ABNORMAL LOW (ref >90)
GFR calc non Af Amer: 62 mL/min — ABNORMAL LOW (ref >90)
Glucose, Bld: 110 mg/dL — ABNORMAL HIGH (ref 70–99)
Potassium: 4.2 mEq/L (ref 3.5–5.1)
Sodium: 138 mEq/L (ref 135–145)
Total Bilirubin: 0.3 mg/dL (ref 0.3–1.2)
Total Protein: 7.5 g/dL (ref 6.0–8.3)

## 2013-03-26 LAB — URINALYSIS, ROUTINE W REFLEX MICROSCOPIC
Bilirubin Urine: NEGATIVE
Glucose, UA: NEGATIVE mg/dL
Hgb urine dipstick: NEGATIVE
Ketones, ur: NEGATIVE mg/dL
Leukocytes, UA: NEGATIVE
Nitrite: NEGATIVE
Protein, ur: NEGATIVE mg/dL
Specific Gravity, Urine: 1.02 (ref 1.005–1.030)
Urobilinogen, UA: 0.2 mg/dL (ref 0.0–1.0)
pH: 6.5 (ref 5.0–8.0)

## 2013-03-26 LAB — TROPONIN I: Troponin I: <0.3 ng/mL (ref <0.30)

## 2013-03-26 MED ORDER — ATENOLOL 25 MG PO TABS
50.0000 mg | ORAL_TABLET | Freq: Two times a day (BID) | ORAL | Status: DC
  Administered 2013-03-26 – 2013-03-31 (×9): 50 mg via ORAL
  Filled 2013-03-26 (×10): qty 2

## 2013-03-26 MED ORDER — PANTOPRAZOLE SODIUM 40 MG IV SOLR
40.0000 mg | Freq: Two times a day (BID) | INTRAVENOUS | Status: DC
  Administered 2013-03-26 – 2013-03-28 (×4): 40 mg via INTRAVENOUS
  Filled 2013-03-26 (×4): qty 40

## 2013-03-26 MED ORDER — SODIUM CHLORIDE 0.9 % IJ SOLN
3.0000 mL | Freq: Two times a day (BID) | INTRAMUSCULAR | Status: DC
  Administered 2013-03-26 – 2013-03-31 (×8): 3 mL via INTRAVENOUS

## 2013-03-26 MED ORDER — ONDANSETRON HCL 4 MG PO TABS: 4.0000 mg | ORAL_TABLET | Freq: Four times a day (QID) | ORAL | Status: DC | PRN

## 2013-03-26 MED ORDER — ONDANSETRON HCL 4 MG/2ML IJ SOLN: 4.0000 mg | Freq: Four times a day (QID) | INTRAMUSCULAR | Status: DC | PRN

## 2013-03-26 MED ORDER — ZOLPIDEM TARTRATE 5 MG PO TABS
5.0000 mg | ORAL_TABLET | Freq: Once | ORAL | Status: AC
  Administered 2013-03-26: 5 mg via ORAL
  Filled 2013-03-26: qty 1

## 2013-03-26 MED ORDER — ACETAMINOPHEN 325 MG PO TABS: 650.0000 mg | ORAL_TABLET | Freq: Four times a day (QID) | ORAL | Status: DC | PRN

## 2013-03-26 MED ORDER — LISINOPRIL 10 MG PO TABS: 10.0000 mg | ORAL_TABLET | Freq: Every morning | ORAL | Status: DC

## 2013-03-26 MED ORDER — POTASSIUM CHLORIDE CRYS ER 20 MEQ PO TBCR
20.0000 meq | EXTENDED_RELEASE_TABLET | Freq: Every day | ORAL | Status: DC
  Administered 2013-03-27 – 2013-03-31 (×4): 20 meq via ORAL
  Filled 2013-03-26 (×3): qty 1
  Filled 2013-03-26: qty 2

## 2013-03-26 MED ORDER — SODIUM CHLORIDE 0.9 % IV SOLN
INTRAVENOUS | Status: DC
  Administered 2013-03-26 – 2013-03-28 (×3): via INTRAVENOUS

## 2013-03-26 MED ORDER — ACETAMINOPHEN 650 MG RE SUPP: 650.0000 mg | Freq: Four times a day (QID) | RECTAL | Status: DC | PRN

## 2013-03-26 MED ORDER — POTASSIUM CHLORIDE CRYS ER 10 MEQ PO TBCR
10.0000 meq | EXTENDED_RELEASE_TABLET | Freq: Every day | ORAL | Status: DC
  Administered 2013-03-26 – 2013-03-30 (×5): 10 meq via ORAL
  Filled 2013-03-26 (×5): qty 1

## 2013-03-26 MED ORDER — SIMVASTATIN 10 MG PO TABS
10.0000 mg | ORAL_TABLET | Freq: Every day | ORAL | Status: DC
  Administered 2013-03-26 – 2013-03-30 (×5): 10 mg via ORAL
  Filled 2013-03-26 (×5): qty 1

## 2013-03-26 NOTE — ED Notes (Signed)
Pt co dark black tarry stools that started today.

## 2013-03-26 NOTE — H&P (Signed)
[facility] History and Physical  Renee Blake ZOX:[id number] DOB: [birth year] DOA: 03/26/2013  Referring physician: Dr. Juleen China PCP: Ernestine Conrad, MD   Chief Complaint: syncope  HPI: Renee Blake is a 79 y.o. female who was recently admitted to [facility] with GI bleeding. She was found to have a duodenal ulcer which was treated endoscopically and the patient was discharged home. Her family reports that since her discharge, she has been generally weak. This was likely due to deconditioning. She reports of her stool did return to a brown color. She also says that she has had chronic diarrhea. During her period of hospitalization, she was diagnosed with a microscopic colitis and started on course of prednisone. The patient reports that yesterday she had episodes of dark, tarry stools. She's had a total of 34 episodes of loose stools since yesterday. She was on the toilet today when she felt increasingly lightheaded, diaphoretic, generally weak and felt as though she was going to pass out. She placed herself on the floor and lay there for some time. She does not report any loss of consciousness, but her family does feel that she did in fact pass out. She was found by her family members. Per reports, she possibly hit the backside of her head. She denies any recent fevers, dysuria, vomiting. She did have significant nausea today. Her blood pressure had been running high lately and she went to her primary care doctor and was started on a diuretic in addition to her other antihypertensives. She does not have any changes in vision, unilateral weakness or numbness at this time. She did have some crampy abdominal pain with her bowel movements, but that has since subsided. She still feels lightheaded and dizzy and generally weak. The patient is admitted to the hospital for further workup.   Review of Systems: Pertinent positives as per HPI, otherwise negative  Past Medical History  Diagnosis  Date  . Hypertension   . Hyperlipemia   . History of stomach ulcers     1980s or 1990s  . Skin cancer   . Chronic diarrhea   . Vertigo    Past Surgical History  Procedure Laterality Date  . Nose surgery      for skin cancer  . Esophagogastroduodenoscopy N/A 02/27/2013    Procedure: ESOPHAGOGASTRODUODENOSCOPY (EGD);  Surgeon: Meryl Dare, MD;  Location: [facility] ENDOSCOPY;  Service: Endoscopy;  Laterality: N/A;  . Hot hemostasis N/A 02/27/2013    Procedure: HOT HEMOSTASIS (ARGON PLASMA COAGULATION/BICAP);  Surgeon: Meryl Dare, MD;  Location: [facility] ENDOSCOPY;  Service: Endoscopy;  Laterality: N/A;   Social History:  reports that she has never smoked. She does not have any smokeless tobacco history on file. She reports that she does not drink alcohol or use illicit drugs.  Allergies  Allergen Reactions  . Codeine Other (See Comments)    Reaction unknown  . Sulfa Antibiotics Other (See Comments)    Reactions unknown    Family History  Problem Relation Age of Onset  . Heart attack Father      Prior to Admission medications   Medication Sig Start Date End Date Taking? Authorizing Provider  atenolol (TENORMIN) 50 MG tablet Take 50 mg by mouth 2 (two) times daily.   Yes Historical Provider, MD  lisinopril (PRINIVIL,ZESTRIL) 10 MG tablet Take 10 mg by mouth every morning.   Yes Historical Provider, MD  loperamide (IMODIUM A-D) 2 MG tablet Take 4 mg by mouth every morning.    Yes  Historical Provider, MD  lovastatin (MEVACOR) 20 MG tablet Take 20 mg by mouth at bedtime.   Yes Historical Provider, MD  meclizine (ANTIVERT) 25 MG tablet Take 25 mg by mouth 2 (two) times daily as needed for dizziness (VERTIGO).   Yes Historical Provider, MD  pantoprazole (PROTONIX) 40 MG tablet Take 40 mg by mouth 2 (two) times daily.   Yes Historical Provider, MD  potassium chloride (K-DUR,KLOR-CON) 10 MEQ tablet Take 10-20 mEq by mouth 2 (two) times daily. Two tablets take in the morning and one tablet  taken in the evening 03/10/13  Yes Historical Provider, MD  traMADol (ULTRAM) 50 MG tablet Take 50 mg by mouth 4 (four) times daily as needed for moderate pain or severe pain.    Yes Historical Provider, MD  triamterene-hydrochlorothiazide (MAXZIDE-25) 37.5-25 MG per tablet Take 1 tablet by mouth every morning. 03/22/13  Yes Historical Provider, MD  Tetrahydrozoline HCl (EYE DROPS OP) Apply 2 drops to eye daily as needed (dry eyes).    Historical Provider, MD   Physical Exam: Filed Vitals:   03/26/13 1825  BP: 128/59  Pulse: 72  Temp: 98 F (36.7 C)  Resp: 18    BP 128/59  Pulse 72  Temp(Src) 98 F (36.7 C) (Oral)  Resp 18  SpO2 98%  General:  Appears calm and comfortable Eyes: PERRL, normal lids, irises & conjunctiva ENT: grossly normal hearing, lips & tongue Neck: no LAD, masses or thyromegaly Cardiovascular: RRR, no m/r/g. No LE edema. Telemetry: SR, no arrhythmias  Respiratory: CTA bilaterally, no w/r/r. Normal respiratory effort. Abdomen: soft, nt, nd, bs+ Skin: no rash or induration seen on limited exam Musculoskeletal: grossly normal tone BUE/BLE Psychiatric: grossly normal mood and affect, speech fluent and appropriate Neurologic: grossly non-focal.          Labs on Admission:  Basic Metabolic Panel:  Recent Labs Lab 03/26/13 1542  NA 138  K 4.2  CL 101  CO2 26  GLUCOSE 110*  BUN 17  CREATININE 0.85  CALCIUM 10.0   Liver Function Tests:  Recent Labs Lab 03/26/13 1542  AST 15  ALT 9  ALKPHOS 71  BILITOT 0.3  PROT 7.5  ALBUMIN 3.9   No results found for this basename: LIPASE, AMYLASE,  in the last 168 hours No results found for this basename: AMMONIA,  in the last 168 hours CBC:  Recent Labs Lab 03/26/13 1542  WBC 12.5*  NEUTROABS 10.2*  HGB 12.6  HCT 38.0  MCV 89.4  PLT 307   Cardiac Enzymes: No results found for this basename: CKTOTAL, CKMB, CKMBINDEX, TROPONINI,  in the last 168 hours  BNP (last 3 results) No results found for  this basename: PROBNP,  in the last 8760 hours CBG: No results found for this basename: GLUCAP,  in the last 168 hours  Radiological Exams on Admission: No results found.  EKG: Independently reviewed. No acute ST-T changes  Assessment/Plan Principal Problem:   Syncope Active Problems:   Hypertension   Rectal bleeding   Dehydration   Generalized weakness   1. Syncope. Appears to be vasovagal in origin. Possibly related to an element of dehydration. We'll monitor the patient on telemetry, cycle cardiac markers and check 2-D echocardiogram. Patient will be hydrated with IV fluids we will check orthostatic vital signs. It does not appear that her rectal bleeding is playing a role here since her hemoglobin is normal. 2. Rectal bleeding/melena. This appears to be a recurrent finding. Rectal exam done by ER physician  did not reveal any stool in her rectal vault. We will check for stool occult blood with her next bowel movement. We will check serial CBCs. Continue on twice a day proton pump inhibitors. We will also request a gastroenterology consultation to see if repeat EGD is needed at this time. She does have a mild leukocytosis and since she has recently been in the hospital, we will check for Clostridium difficile since she is having diarrhea. 3. Dehydration. Patient will be started on IV fluids. Hold diuretics. 4. Hypertension. Continue lisinopril and atenolol at this time. 5. Generalized weakness. Likely related to volume depletion as well as generalized deconditioning. Will likely benefit from home health physical therapy at home.  Code Status: full code Family Communication: discussed with son and daughter at the bedside Disposition Plan: discharge home once improved  Time spent:  [facility] Pager [phone]

## 2013-03-26 NOTE — ED Notes (Signed)
Pt's granddaughter states she feels strongly that the pt hit the back left side of head and wants her to have a CT scan. Pt's family states that the pt did not know who she was when she found it.

## 2013-03-26 NOTE — ED Notes (Signed)
Pt is on backboard, pt denies fall but family states pt fell at home. Pt's family states pt has a recent HX of lower GI bleed. Pt denies pain.

## 2013-03-26 NOTE — ED Notes (Signed)
Spine board removed, pt has no complaints of tenderness or pain.

## 2013-03-26 NOTE — ED Provider Notes (Signed)
CSN: [id number]     Arrival date & time 03/26/13  1432 History   This chart was scribed for Renee Razor, MD, by Yevette Edwards, ED Scribe. This patient was seen in room APA17/APA17 and the patient's care was started at 2:54 PM.  First MD Initiated Contact with Patient 03/26/13 1436     Chief Complaint  Patient presents with  . Rectal Bleeding    The history is provided by the patient. No language interpreter was used.   HPI Comments: Renee Blake is a 79 y.o. female, with a h/o ulcers and chronic diarrhea, who presents to the Emergency Department complaining of an episode of hematochezia which occurred early this morning and which was associated with diarrhea and abdominal pain.  The pt states she had a syncopal episode this morning when she felt weak/dizzy and had to lower herself to the ground. Says she didn't actually fall. Thinks may have had brief LOC. Grand daughter found pt with her head against a dresser, but awake. Denies HA, neck or back pain. She denies SOB.  The pt was treated in the ED three weeks ago and was transferred to [facility] where she was treated for an ulcer to her duodenum and a hemoglobin in 7s.  The pt reports her current symptoms are similar to the symptoms for which she was treated for at [facility].  The pt's daughter reports that the pt's BP has been elevated since she was discharged from [facility]; in the ED her BP is 131/56. The pt is a non-smoker.   Past Medical History  Diagnosis Date  . Hypertension   . Hyperlipemia   . History of stomach ulcers     1980s or 1990s  . Skin cancer   . Chronic diarrhea   . Vertigo    Past Surgical History  Procedure Laterality Date  . Nose surgery      for skin cancer  . Esophagogastroduodenoscopy N/A 02/27/2013    Procedure: ESOPHAGOGASTRODUODENOSCOPY (EGD);  Surgeon: Meryl Dare, MD;  Location: [facility] ENDOSCOPY;  Service: Endoscopy;  Laterality: N/A;  . Hot hemostasis N/A 02/27/2013    Procedure: HOT HEMOSTASIS (ARGON PLASMA  COAGULATION/BICAP);  Surgeon: Meryl Dare, MD;  Location: [facility] ENDOSCOPY;  Service: Endoscopy;  Laterality: N/A;   Family History  Problem Relation Age of Onset  . Heart attack Father    History  Substance Use Topics  . Smoking status: Never Smoker   . Smokeless tobacco: Not on file  . Alcohol Use: No   No OB history provided.  Review of Systems  Respiratory: Positive for shortness of breath.   Gastrointestinal: Positive for abdominal pain, diarrhea and blood in stool.  Neurological: Positive for dizziness and weakness.  All other systems reviewed and are negative.   Allergies  Codeine and Sulfa antibiotics  Home Medications   Current Outpatient Rx  Name  Route  Sig  Dispense  Refill  . acetaminophen (TYLENOL) 500 MG tablet   Oral   Take 1,000 mg by mouth daily as needed for headache.         Marland Kitchen atenolol (TENORMIN) 50 MG tablet   Oral   Take 50 mg by mouth 2 (two) times daily.         . ciprofloxacin (CIPRO) 500 MG tablet   Oral   Take 1 tablet (500 mg total) by mouth 2 (two) times daily.   6 tablet   0   . lisinopril (PRINIVIL,ZESTRIL) 2.5 MG tablet   Oral  Take 2.5 mg by mouth daily.         Marland Kitchen loperamide (IMODIUM A-D) 2 MG tablet   Oral   Take 4 mg by mouth daily as needed for diarrhea or loose stools.         . meclizine (ANTIVERT) 25 MG tablet   Oral   Take 25 mg by mouth 2 (two) times daily as needed for dizziness (VERTIGO).         Marland Kitchen pantoprazole (PROTONIX) 40 MG tablet   Oral   Take 1 tablet (40 mg total) by mouth 2 (two) times daily.   120 tablet   1   . potassium chloride (K-DUR) 10 MEQ tablet   Oral   Take 10-20 mEq by mouth 2 (two) times daily. Takes two in the morning and one in the evening         . Tetrahydrozoline HCl (EYE DROPS OP)   Ophthalmic   Apply 2 drops to eye daily as needed (dry eyes).         . traMADol (ULTRAM) 50 MG tablet   Oral   Take 50 mg by mouth 4 (four) times daily.          Triage Vitals: BP  131/56  Pulse 68  Temp(Src) 97.9 F (36.6 C) (Oral)  Resp 18  SpO2 98%  Physical Exam  Nursing note and vitals reviewed. Constitutional: She is oriented to person, place, and time. She appears well-developed and well-nourished. No distress.  HENT:  Head: Normocephalic and atraumatic.  Eyes: EOM are normal.  Pale conjunctiva.  Neck: Neck supple. No tracheal deviation present.  Cardiovascular: Normal rate.   Pulmonary/Chest: Effort normal. No respiratory distress.  Abdominal: Soft. There is no tenderness.  Genitourinary:  Perianal skin tags. Normal tone. No stool in vault. No stool noted on glove. No mass palpated.   Musculoskeletal: Normal range of motion.  Neurological: She is alert and oriented to person, place, and time.  Skin: Skin is warm and dry.  Psychiatric: She has a normal mood and affect. Her behavior is normal.    ED Course  Procedures (including critical care time)  DIAGNOSTIC STUDIES: Oxygen Saturation is 98% on room air, normal by my interpretation.    COORDINATION OF CARE:  2:59 PM- Discussed treatment plan with patient, and the patient agreed to the plan.   Granddaughter now at bedside. Requesting CT scan of head. Addressed concerns, but no indication. Pt denies any pain. Non focal neuro exam. No blood thinners.   Labs Review Labs Reviewed  CBC WITH DIFFERENTIAL - Abnormal; Notable for the following:    WBC 12.5 (*)    Neutrophils Relative % 82 (*)    Neutro Abs 10.2 (*)    Lymphocytes Relative 8 (*)    Monocytes Absolute 1.2 (*)    All other components within normal limits  COMPREHENSIVE METABOLIC PANEL - Abnormal; Notable for the following:    Glucose, Bld 110 (*)    GFR calc non Af Amer 62 (*)    GFR calc Af Amer 72 (*)    All other components within normal limits  URINALYSIS, ROUTINE W REFLEX MICROSCOPIC  TYPE AND SCREEN   Imaging Review No results found.  EKG Interpretation    Date/Time:  Friday March 26 2013 17:22:41  EST Ventricular Rate:  65 PR Interval:  168 QRS Duration: 84 QT Interval:  430 QTC Calculation: 447 R Axis:   -17 Text Interpretation:  Normal sinus rhythm Left ventricular hypertrophy q waves  I and aVL as note on previous stable from previous EKG from 01/2013 Confirmed by Mattisyn Cardona  MD, Lyanne Kates ([id number]) on 03/26/2013 5:34:50 PM            MDM   1. Syncope   2. Stool color black     84yF syncope. Unclear etiology. Reports melenotic appearing stool today with hx of recent upper GI bleed/duodenal ulcer on endoscopy. Hemoglobin normal. BUN normal. No stool in rectal vault on exam. No blood thinners. Occasionally used Goodie Powder prior to recent admission. Denies usage since.   Not clear if dark appearing stool actually melena or related to today's event. Not clinically convincing for active GI bleed, although family understandably concerned. I suspect at some of her generalzed weakness attributable to deconditioning from recent illness and hospitalization. EKG with normal intervals. No significant changes from previous.    I personally preformed the services scribed in my presence. The recorded information has been reviewed is accurate. Renee Razor, MD.    Renee Razor, MD 03/26/13 [phone]

## 2013-03-27 DIAGNOSIS — K625 Hemorrhage of anus and rectum: Secondary | ICD-10-CM

## 2013-03-27 DIAGNOSIS — K922 Gastrointestinal hemorrhage, unspecified: Secondary | ICD-10-CM

## 2013-03-27 DIAGNOSIS — I059 Rheumatic mitral valve disease, unspecified: Secondary | ICD-10-CM

## 2013-03-27 DIAGNOSIS — K259 Gastric ulcer, unspecified as acute or chronic, without hemorrhage or perforation: Secondary | ICD-10-CM

## 2013-03-27 LAB — BASIC METABOLIC PANEL
BUN: 19 mg/dL (ref 6–23)
CO2: 22 mEq/L (ref 19–32)
Calcium: 9.2 mg/dL (ref 8.4–10.5)
Chloride: 102 mEq/L (ref 96–112)
Creatinine, Ser: 1.34 mg/dL — ABNORMAL HIGH (ref 0.50–1.10)
GFR calc Af Amer: 42 mL/min — ABNORMAL LOW (ref >90)
GFR calc non Af Amer: 36 mL/min — ABNORMAL LOW (ref >90)
Glucose, Bld: 124 mg/dL — ABNORMAL HIGH (ref 70–99)
Potassium: 3.7 mEq/L (ref 3.5–5.1)
Sodium: 137 mEq/L (ref 135–145)

## 2013-03-27 LAB — TROPONIN I
Troponin I: <0.3 ng/mL (ref <0.30)
Troponin I: <0.3 ng/mL (ref <0.30)

## 2013-03-27 LAB — CBC
HCT: 33.1 % — ABNORMAL LOW (ref 36.0–46.0)
Hemoglobin: 10.9 g/dL — ABNORMAL LOW (ref 12.0–15.0)
MCH: 29.4 pg (ref 26.0–34.0)
MCHC: 32.9 g/dL (ref 30.0–36.0)
MCV: 89.2 fL (ref 78.0–100.0)
Platelets: 260 10*3/uL (ref 150–400)
RBC: 3.71 MIL/uL — ABNORMAL LOW (ref 3.87–5.11)
RDW: 14.1 % (ref 11.5–15.5)
WBC: 7.3 10*3/uL (ref 4.0–10.5)

## 2013-03-27 LAB — CLOSTRIDIUM DIFFICILE BY PCR: Toxigenic C. Difficile by PCR: NEGATIVE

## 2013-03-27 LAB — TSH: TSH: 0.812 u[IU]/mL (ref 0.350–4.500)

## 2013-03-27 LAB — HEMOGLOBIN AND HEMATOCRIT, BLOOD
HCT: 30.6 % — ABNORMAL LOW (ref 36.0–46.0)
Hemoglobin: 10 g/dL — ABNORMAL LOW (ref 12.0–15.0)

## 2013-03-27 LAB — OCCULT BLOOD X 1 CARD TO LAB, STOOL: Fecal Occult Bld: NEGATIVE

## 2013-03-27 MED ORDER — ZOLPIDEM TARTRATE 5 MG PO TABS
5.0000 mg | ORAL_TABLET | Freq: Every evening | ORAL | Status: DC | PRN
  Administered 2013-03-27 – 2013-03-30 (×4): 5 mg via ORAL
  Filled 2013-03-27 (×4): qty 1

## 2013-03-27 NOTE — Consult Note (Addendum)
Referring Provider: No ref. provider found Primary Care Physician:  Ernestine Conrad, MD Primary Gastroenterologist:  Dr. Karilyn Cota  Reason for Consultation:   Melena and diarrhea. History of upper GI bleed secondary to duodenal ulcer 4 weeks ago.  HPI:  Patient is a 79 year old Caucasian female with history of peptic ulcer disease and chronic diarrhea who presented to the emergency room yesterday with single episode of tarry stool, weakness and syncope. Patient was treated at [facility] last month for upper GI bleed. She was found to have duodenal ulcer with active bleeding treated with combination of injection and gold probe therapy by Dr. Claudette Head. She required 2 units of PRBCs. H. pylori serology was negative. Patient was discharged and double dose PPI. According to her daughter [place] her recovery has been slow and she has continued to feel weak. She has had intermittent diarrhea which she has had all of her life. She woke up around 2 AM yesterday with lower abdominal cramps and had watery diarrhea. She had another episode of diarrhea weakness lightheadedness and she apparently passed out around 2 PM yesterday. She was found in the floor by her granddaughter and brought to the emergency room by EMS. She was evaluated in emergency room. Her H&H was 12.6 and 38. Rectal exam by Dr. [name] revealed no stool or blood in the rectum. Patient was admitted to Dr. Delfin Gant service. She hasn't had any more bowel movements. She did have one Hemoccult during the night and was guaiac negative. Patient denies nausea vomiting heartburn or abdominal pain. She is hungry. She feels she hasn't fully recovered from her last admission. She denies using NSAIDs. She apparently was taking the powder every now and then prior to her last admission. She has lost weight gradually over the years but not sure how much. She states she has had diarrhea off and on for several years. She had colonoscopy by Dr. Jones Skene of [facility] and diagnosed with microscopic colitis. She states she takes 2 tablets of Imodium OTC every morning and has one to 2 formed stool daily. Review of the systems is negative for fever or chills. Her daughter also has concerns about her fluctuating blood pressure. She was seen by Dr. Loney Hering one week ago and diuretic added. Her daughters lives with her. She does not drive. She does not smoke cigarettes or drink alcoh   Past Medical History  Diagnosis Date  . Hypertension   . Hyperlipemia   . History of stomach ulcers     1980s or 1990s  . Skin cancer   . Chronic diarrhea   . Vertigo     Past Surgical History  Procedure Laterality Date  . Nose surgery      for skin cancer  . Esophagogastroduodenoscopy N/A 02/27/2013    Procedure: ESOPHAGOGASTRODUODENOSCOPY (EGD);  Surgeon: Meryl Dare, MD;  Location: [facility] ENDOSCOPY;  Service: Endoscopy;  Laterality: N/A;  . Hot hemostasis N/A 02/27/2013    Procedure: HOT HEMOSTASIS (ARGON PLASMA COAGULATION/BICAP);  Surgeon: Meryl Dare, MD;  Location: [facility] ENDOSCOPY;  Service: Endoscopy;  Laterality: N/A;    Prior to Admission medications   Medication Sig Start Date End Date Taking? Authorizing Provider  atenolol (TENORMIN) 50 MG tablet Take 50 mg by mouth 2 (two) times daily.   Yes Historical Provider, MD  lisinopril (PRINIVIL,ZESTRIL) 10 MG tablet Take 10 mg by mouth every morning.   Yes Historical Provider, MD  loperamide (IMODIUM A-D) 2 MG tablet Take 4 mg by  mouth every morning.    Yes Historical Provider, MD  lovastatin (MEVACOR) 20 MG tablet Take 20 mg by mouth at bedtime.   Yes Historical Provider, MD  meclizine (ANTIVERT) 25 MG tablet Take 25 mg by mouth 2 (two) times daily as needed for dizziness (VERTIGO).   Yes Historical Provider, MD  pantoprazole (PROTONIX) 40 MG tablet Take 40 mg by mouth 2 (two) times daily.   Yes Historical Provider, MD  potassium chloride (K-DUR,KLOR-CON) 10 MEQ tablet Take 10-20 mEq  by mouth 2 (two) times daily. Two tablets take in the morning and one tablet taken in the evening 03/10/13  Yes Historical Provider, MD  traMADol (ULTRAM) 50 MG tablet Take 50 mg by mouth 4 (four) times daily as needed for moderate pain or severe pain.    Yes Historical Provider, MD  triamterene-hydrochlorothiazide (MAXZIDE-25) 37.5-25 MG per tablet Take 1 tablet by mouth every morning. 03/22/13  Yes Historical Provider, MD  Tetrahydrozoline HCl (EYE DROPS OP) Apply 2 drops to eye daily as needed (dry eyes).    Historical Provider, MD    Current Facility-Administered Medications  Medication Dose Route Frequency Provider Last Rate Last Dose  . 0.9 %  sodium chloride infusion   Intravenous Continuous Erick Blinks, MD 100 mL/hr at 03/26/13 2048    . acetaminophen (TYLENOL) tablet 650 mg  650 mg Oral Q6H PRN Erick Blinks, MD       Or  . acetaminophen (TYLENOL) suppository 650 mg  650 mg Rectal Q6H PRN Erick Blinks, MD      . atenolol (TENORMIN) tablet 50 mg  50 mg Oral BID Erick Blinks, MD   50 mg at 03/26/13 2124  . ondansetron (ZOFRAN) tablet 4 mg  4 mg Oral Q6H PRN Erick Blinks, MD       Or  . ondansetron (ZOFRAN) injection 4 mg  4 mg Intravenous Q6H PRN Erick Blinks, MD      . pantoprazole (PROTONIX) injection 40 mg  40 mg Intravenous Q12H Erick Blinks, MD   40 mg at 03/27/13 1008  . potassium chloride (K-DUR,KLOR-CON) CR tablet 10 mEq  10 mEq Oral QHS Erick Blinks, MD   10 mEq at 03/26/13 2124  . potassium chloride (K-DUR,KLOR-CON) CR tablet 20 mEq  20 mEq Oral Daily Erick Blinks, MD   20 mEq at 03/27/13 1007  . simvastatin (ZOCOR) tablet 10 mg  10 mg Oral q1800 Erick Blinks, MD   10 mg at 03/26/13 2048  . sodium chloride 0.9 % injection 3 mL  3 mL Intravenous Q12H Erick Blinks, MD   3 mL at 03/26/13 2124    Allergies as of 03/26/2013 - Review Complete 03/26/2013  Allergen Reaction Noted  . Codeine Other (See Comments) 08/24/2012  . Sulfa antibiotics Other (See  Comments) 08/24/2012    Family History  Problem Relation Age of Onset  . Heart attack Father     History   Social History  . Marital Status: Widowed    Spouse Name: N/A    Number of Children: N/A  . Years of Education: N/A   Occupational History  . Not on file.   Social History Main Topics  . Smoking status: Never Smoker   . Smokeless tobacco: Not on file  . Alcohol Use: No  . Drug Use: No  . Sexual Activity: Not on file   Other Topics Concern  . Not on file   Social History Narrative  . No narrative on file    Review of Systems: See  HPI, otherwise normal ROS  Physical Exam: Temp:  [97.9 F (36.6 C)-98.5 F (36.9 C)] 98.2 F (36.8 C) (12/27 0511) Pulse Rate:  [61-72] 61 (12/27 0511) Resp:  [18] 18 (12/27 0511) BP: (88-131)/(42-59) 88/42 mmHg (12/27 0511) SpO2:  [96 %-100 %] 96 % (12/27 0511) Weight:  [113 lb 11.2 oz (51.574 kg)-122 lb 6.4 oz (55.52 kg)] 122 lb 6.4 oz (55.52 kg) (12/27 0511) Last BM Date: 03/26/13 Well-developed thin Caucasian female in NAD. Patient is alert and in no acute distress. She has scar involving the nose and left nasolabial region. Conjunctivae was pink. Sclerae nonicteric. No neck masses or thyromegaly noted. Cardiac exam with regular rhythm normal S1 and S2. Faint systolic ejection murmur noted at LLSB. Lungs are clear to auscultation. Abdomen is symmetrical. Bowel sounds are normal. On palpation it is soft and nontender without organomegaly or masses. No clubbing or peripheral edema noted.   Lab Results:  Recent Labs  03/26/13 1542 03/27/13 0249  WBC 12.5* 7.3  HGB 12.6 10.9*  HCT 38.0 33.1*  PLT 307 260   BMET  Recent Labs  03/26/13 1542 03/27/13 0249  NA 138 137  K 4.2 3.7  CL 101 102  CO2 26 22  GLUCOSE 110* 124*  BUN 17 19  CREATININE 0.85 1.34*  CALCIUM 10.0 9.2   LFT  Recent Labs  03/26/13 1542  PROT 7.5  ALBUMIN 3.9  AST 15  ALT 9  ALKPHOS 71  BILITOT 0.3    Assessment; #1. Patient  is an 79 year old Caucasian [facility] who presents with single episode of tarry stool associated with vasovagal symptoms. Few hours earlier she had an episode of lower abdominal cramps and watery diarrhea. Rectal exam on admission revealed no stool or blood in the rectum. She had Hemoccult early this morning and it was guaiac negative. Patient's hemoglobin has dropped by 1-1/2 g but this may be function of rehydration. Her hemoglobin at the time of discharge 4 weeks ago was 9.7 g. Given history of tarry stool she could have had trivial GI bleed but at the present time there is no evidence of ongoing bleed and she appears to be hemodynamically stable. At this point I do not see need or indication for repeat EGD. If hemoglobin keeps dropping and or her stool turns out to be heme positive we'll definitely consider EGD. #2. Chronic diarrhea. Acute episode yesterday may be related to food ingestion. Recent colonoscopy revealed microscopic colitis. Will request records before budesonide considered.  Recommendations; Continue pantoprazole 40 mg IV every 12 hours for now. Hemoccult two more times. Advance diet to full liquids. If stool is guaiac positive or hemoglobin drops below 10 g would consider repeat EGD. Request colonoscopy/biopsy records from [facility] before recommendations made for microscopic colitis.   LOS: 1 day   Terren Haberle U  03/27/2013, 12:35 PM

## 2013-03-27 NOTE — Progress Notes (Signed)
[facility] PROGRESS NOTE  TONDALAYA PERREN ZOX:[id number] DOB: 02/21/1929 DOA: 03/26/2013 PCP: Ernestine Conrad, MD  Assessment/Plan: 1. Syncope. Possibly vasovagal. No further recurrence,  Work up unremarkable.  Continue IV fluids 2. Rectal bleeding.  Hemoglobin stable. Appreciate GI assistance. If bleeding recurs or patient has decline in hemoglobin, may need repeat EGD. Continue on PPI. 3. Renal insufficiency. Creatinine trending up. ACE inhibitor discontinued.  May be related to hypotension.  Continue IV fluids and monitor urine output. 4. HTN. Stable 5. Dehydration. Improving with IV fluids 6. Generalized weakness. May need PT evaluation  Code Status: full code Family Communication: discussed with patient Disposition Plan: pending hospital course, likely discharge home with home health services   Consultants:  gastroenterology  Procedures:   Antibiotics:    HPI/Subjective: Still feels weak, does not think she has had any further melena, no chest pain or shortness of breath  Objective: Filed Vitals:   03/27/13 2020  BP: 130/56  Pulse: 68  Temp: 97.6 F (36.4 C)  Resp: 18    Intake/Output Summary (Last 24 hours) at 03/27/13 2356 Last data filed at 03/27/13 1700  Gross per 24 hour  Intake   1400 ml  Output    400 ml  Net   1000 ml   Filed Weights   03/26/13 2045 03/27/13 0511  Weight: 51.574 kg (113 lb 11.2 oz) 55.52 kg (122 lb 6.4 oz)    Exam:   General:  NAD  Cardiovascular: S1, S2 RRR  Respiratory: CTA B  Abdomen: soft, nt, nd, bs+  Musculoskeletal: no edema b/l  Data Reviewed: Basic Metabolic Panel:  Recent Labs Lab 03/26/13 1542 03/27/13 0249  NA 138 137  K 4.2 3.7  CL 101 102  CO2 26 22  GLUCOSE 110* 124*  BUN 17 19  CREATININE 0.85 1.34*  CALCIUM 10.0 9.2   Liver Function Tests:  Recent Labs Lab 03/26/13 1542  AST 15  ALT 9  ALKPHOS 71  BILITOT 0.3  PROT 7.5  ALBUMIN 3.9   No results found for this basename:  LIPASE, AMYLASE,  in the last 168 hours No results found for this basename: AMMONIA,  in the last 168 hours CBC:  Recent Labs Lab 03/26/13 1542 03/27/13 0249 03/27/13 1613  WBC 12.5* 7.3  --   NEUTROABS 10.2*  --   --   HGB 12.6 10.9* 10.0*  HCT 38.0 33.1* 30.6*  MCV 89.4 89.2  --   PLT 307 260  --    Cardiac Enzymes:  Recent Labs Lab 03/26/13 2018 03/27/13 0248 03/27/13 0813  TROPONINI <0.30 <0.30 <0.30   BNP (last 3 results) No results found for this basename: PROBNP,  in the last 8760 hours CBG: No results found for this basename: GLUCAP,  in the last 168 hours  Recent Results (from the past 240 hour(s))  CLOSTRIDIUM DIFFICILE BY PCR     Status: None   Collection Time    03/27/13  3:00 AM      Result Value Range Status   C difficile by pcr NEGATIVE  NEGATIVE Final     Studies: No results found.  Scheduled Meds: . atenolol  50 mg Oral BID  . pantoprazole (PROTONIX) IV  40 mg Intravenous Q12H  . potassium chloride  10 mEq Oral QHS  . potassium chloride  20 mEq Oral Daily  . simvastatin  10 mg Oral q1800  . sodium chloride  3 mL Intravenous Q12H   Continuous Infusions: . sodium chloride 75 mL/hr at  03/27/13 1732    Principal Problem:   Syncope Active Problems:   Hypertension   Rectal bleeding   Dehydration   Generalized weakness    Time spent:    Kryslyn Helbig  [facility] Pager [phone]. If 7PM-7AM, please contact night-coverage at [email or web address], password [facility] 03/27/2013, 11:56 PM  LOS: 1 day

## 2013-03-28 ENCOUNTER — Encounter (HOSPITAL_COMMUNITY)
Admission: EM | Disposition: A | Discharge status: Home Health | Payer: Self-pay | Source: Home / Self Care | Attending: Family Medicine

## 2013-03-28 DIAGNOSIS — D62 Acute posthemorrhagic anemia: Secondary | ICD-10-CM

## 2013-03-28 DIAGNOSIS — K922 Gastrointestinal hemorrhage, unspecified: Secondary | ICD-10-CM

## 2013-03-28 DIAGNOSIS — K449 Diaphragmatic hernia without obstruction or gangrene: Secondary | ICD-10-CM

## 2013-03-28 HISTORY — PX: ESOPHAGOGASTRODUODENOSCOPY: SHX5428

## 2013-03-28 LAB — CBC
HCT: 29.3 % — ABNORMAL LOW (ref 36.0–46.0)
HCT: 31.9 % — ABNORMAL LOW (ref 36.0–46.0)
Hemoglobin: 9.7 g/dL — ABNORMAL LOW (ref 12.0–15.0)
Hemoglobin: 10.6 g/dL — ABNORMAL LOW (ref 12.0–15.0)
MCH: 29.6 pg (ref 26.0–34.0)
MCH: 29.8 pg (ref 26.0–34.0)
MCHC: 33.1 g/dL (ref 30.0–36.0)
MCHC: 33.2 g/dL (ref 30.0–36.0)
MCV: 89.3 fL (ref 78.0–100.0)
MCV: 89.6 fL (ref 78.0–100.0)
Platelets: 212 10*3/uL (ref 150–400)
Platelets: 242 10*3/uL (ref 150–400)
RBC: 3.28 MIL/uL — ABNORMAL LOW (ref 3.87–5.11)
RBC: 3.56 MIL/uL — ABNORMAL LOW (ref 3.87–5.11)
RDW: 14 % (ref 11.5–15.5)
RDW: 14 % (ref 11.5–15.5)
WBC: 5 10*3/uL (ref 4.0–10.5)
WBC: 4.2 10*3/uL (ref 4.0–10.5)

## 2013-03-28 LAB — OCCULT BLOOD X 1 CARD TO LAB, STOOL: Fecal Occult Bld: NEGATIVE

## 2013-03-28 LAB — BASIC METABOLIC PANEL
BUN: 14 mg/dL (ref 6–23)
CO2: 21 mEq/L (ref 19–32)
Calcium: 8.7 mg/dL (ref 8.4–10.5)
Chloride: 109 mEq/L (ref 96–112)
Creatinine, Ser: 0.97 mg/dL (ref 0.50–1.10)
GFR calc Af Amer: 61 mL/min — ABNORMAL LOW (ref >90)
GFR calc non Af Amer: 53 mL/min — ABNORMAL LOW (ref >90)
Glucose, Bld: 94 mg/dL (ref 70–99)
Potassium: 3.4 mEq/L — ABNORMAL LOW (ref 3.5–5.1)
Sodium: 140 mEq/L (ref 135–145)

## 2013-03-28 LAB — PREPARE RBC (CROSSMATCH)

## 2013-03-28 LAB — ABO/RH: ABO/RH(D): O NEG

## 2013-03-28 SURGERY — EGD (ESOPHAGOGASTRODUODENOSCOPY)
Anesthesia: Moderate Sedation

## 2013-03-28 MED ORDER — LOPERAMIDE HCL 2 MG PO CAPS
4.0000 mg | ORAL_CAPSULE | Freq: Every day | ORAL | Status: DC
  Administered 2013-03-29 – 2013-03-31 (×3): 4 mg via ORAL
  Filled 2013-03-28 (×3): qty 2

## 2013-03-28 MED ORDER — BUTAMBEN-TETRACAINE-BENZOCAINE 2-2-14 % EX AERO
INHALATION_SPRAY | CUTANEOUS | Status: DC | PRN
  Administered 2013-03-28: 2 via TOPICAL

## 2013-03-28 MED ORDER — MIDAZOLAM HCL 5 MG/5ML IJ SOLN
INTRAMUSCULAR | Status: DC | PRN
  Administered 2013-03-28: 1 mg via INTRAVENOUS
  Administered 2013-03-28: 2 mg via INTRAVENOUS
  Administered 2013-03-28: 1 mg via INTRAVENOUS

## 2013-03-28 MED ORDER — MEPERIDINE HCL 50 MG/ML IJ SOLN
INTRAMUSCULAR | Status: DC | PRN
  Administered 2013-03-28: 25 mg via INTRAVENOUS

## 2013-03-28 MED ORDER — SODIUM CHLORIDE 0.9 % IV SOLN: INTRAVENOUS | Status: DC

## 2013-03-28 MED ORDER — STERILE WATER FOR IRRIGATION IR SOLN
Status: DC | PRN
  Administered 2013-03-28: 14:00:00

## 2013-03-28 MED ORDER — MIDAZOLAM HCL 5 MG/5ML IJ SOLN
INTRAMUSCULAR | Status: AC
  Filled 2013-03-28: qty 10

## 2013-03-28 MED ORDER — MEPERIDINE HCL 50 MG/ML IJ SOLN
INTRAMUSCULAR | Status: AC
  Filled 2013-03-28: qty 1

## 2013-03-28 MED ORDER — PANTOPRAZOLE SODIUM 40 MG PO TBEC
40.0000 mg | DELAYED_RELEASE_TABLET | Freq: Two times a day (BID) | ORAL | Status: DC
  Administered 2013-03-28 – 2013-03-31 (×6): 40 mg via ORAL
  Filled 2013-03-28 (×6): qty 1

## 2013-03-28 MED ORDER — EPINEPHRINE HCL 0.1 MG/ML IJ SOSY
PREFILLED_SYRINGE | INTRAMUSCULAR | Status: AC
  Filled 2013-03-28: qty 10

## 2013-03-28 NOTE — Progress Notes (Signed)
Subjective. Patient states she had 3 bowel movements in the last 24 hours. Stools are liquid and greenish. She has cramping and urgency. She does not have a good appetite but she denies nausea or vomiting. She also denies epigastric pain. She has been taking the powder objective her admission at [facility] for upper GI bleed last month but none since then.  Objective; BP 124/80  Pulse 61  Temp(Src) 98.2 F (36.8 C) (Oral)  Resp 18  Ht 5\' 6"  (1.676 m)  Wt 122 lb 8 oz (55.566 kg)  BMI 19.78 kg/m2  SpO2 95% Patient is alert and in no acute distress. Abdomen is soft and nontender.  No organomegaly or masses.   Lab data; WBC 5.0, H&H 9.7 and 29.3 and platelet count 212K. Metabolic 7 within normal limits except serum potassium of 3.4. First Hemoccult was negative and second is pending.  Assessment;  #1.  Melena in a patient with history of bleeding duodenal ulcer one month ago. No melena reported since admission her hemoglobin has dropped from 12.6 to 9.7. #2, Chronic diarrhea. Symptoms are suggestive of irritable bowel syndrome. Recent colonoscopy suggested microscopic colitis. These records have been requested and not yet received.   Recommendations; Esophagogastroduodenoscopy this afternoon.

## 2013-03-28 NOTE — Progress Notes (Signed)
[facility] PROGRESS NOTE  JESUS POPLIN UJW:[id number] DOB: 03-Feb-1929 DOA: 03/26/2013 PCP: Ernestine Conrad, MD  Assessment/Plan: 1. Melena, further evaluation per GI, endoscopy plan today. Continue PPI. No evidence of ongoing bleeding at this point although hemoglobin has trended downward. Remains hemodynamically stable. 2. Acute blood loss anemia: Suspect acute blood loss from GI bleed. 3. Possible syncope likely vasovagal, dehydration, anemia. 2-D echocardiogram unremarkable. 4. Acute renal failure resolved. ACE inhibitor discontinued. Possibly secondary to hypotension.  5. History of duodenal ulcer treated endoscopically <1 monthly 6. Generalized weakness 7. Hypertension, stable 8. Chronic diarrhea, microscopic colitis, chronic Imodium use   EGD today, continue PPI  Monitor CBC  Type and cross 2 units, hold for now  Consult physical therapy   Discussed with daughter and son-in-law bedside  Pending studies:   none  Code Status: full code DVT prophylaxis: SCDs Family Communication: as above Disposition Plan: home when improved  Brendia Sacks, MD  [facility]  Pager [phone] If 7PM-7AM, please contact night-coverage at [email or web address], password [facility] 03/28/2013, 11:39 AM  LOS: 2 days   Summary: 79 year old woman recently treated endoscopically for bleeding duodenal ulcer presenting with generalized weakness, dark tarry stools and possible syncope.  Consultants:  Gastroenterology   Procedures:  EGD:   2-D echocardiogram: Left ventricular ejection fraction 65-70%. Normal wall motion. Grade 1 diastolic dysfunction   HPI/Subjective: No nausea or vomiting. No abdominal pain. No known bleeding.  Objective: Filed Vitals:   03/28/13 0500 03/28/13 0609 03/28/13 0611 03/28/13 0612  BP:  122/78 120/76 124/80  Pulse:  61    Temp:  98.2 F (36.8 C)    TempSrc:  Oral    Resp:  18    Height:      Weight: 55.566 kg (122 lb 8 oz)     SpO2:  95%       Intake/Output Summary (Last 24 hours) at 03/28/13 1139 Last data filed at 03/28/13 1030  Gross per 24 hour  Intake 2948.33 ml  Output    900 ml  Net 2048.33 ml     Filed Weights   03/26/13 2045 03/27/13 0511 03/28/13 0500  Weight: 51.574 kg (113 lb 11.2 oz) 55.52 kg (122 lb 6.4 oz) 55.566 kg (122 lb 8 oz)    Exam:   Afebrile, vital signs stable  General: Appears calm and comfortable. Speech fluent and clear.  Cardiovascular: Regular rate and rhythm. No murmur, rub or gallop. No lower extremity edema.  Respiratory: Clear to auscultation bilaterally. No wheezes, rales or rhonchi. Normal respiratory effort.  Abdomen: Soft, nontender, nondistended.  Data Reviewed:  Potassium 3.4. BUN and creatinine normal.  Hemoglobin trending downwards, 9.7   Scheduled Meds: . atenolol  50 mg Oral BID  . pantoprazole (PROTONIX) IV  40 mg Intravenous Q12H  . potassium chloride  10 mEq Oral QHS  . potassium chloride  20 mEq Oral Daily  . simvastatin  10 mg Oral q1800  . sodium chloride  3 mL Intravenous Q12H   Continuous Infusions: . sodium chloride 75 mL/hr at 03/28/13 6578    Principal Problem:   Syncope Active Problems:   Hypertension   Rectal bleeding   Dehydration   Generalized weakness   Time spent 20 minutes

## 2013-03-28 NOTE — Op Note (Signed)
EGD PROCEDURE REPORT  PATIENT:  Renee Blake  MR#:  [id number] Birthdate:  [birth year], 79 y.o., female Endoscopist:  Dr. Malissa Hippo, MD Referred By:  Dr. Erick Blinks, MD Procedure Date: 03/28/2013  Procedure:   EGD  Indications:  Patient is an 79 year old Caucasian female who presents with history of melena and weakness. She was hospitalized at [facility] one month ago for 2 unit upper GI bleed secondary to bleeding from duodenal ulcer treated endoscopically. H. pylori serology was negative. She has history of taking Goody powder but denies use over the last 4 weeks. Patient's hemoglobin has dropped by almost 3 g since admission. She is undergoing diagnostic and possibly therapeutic EGD.            Informed Consent:  The risks, benefits, alternatives & imponderables which include, but are not limited to, bleeding, infection, perforation, drug reaction and potential missed lesion have been reviewed.  The potential for biopsy, lesion removal, esophageal dilation, etc. have also been discussed.  Questions have been answered.  All parties agreeable.  Please see history & physical in medical record for more information.  Medications:  Demerol 25 mg IV Versed 4 mg IV Cetacaine spray topically for oropharyngeal anesthesia  Description of procedure:  The endoscope was introduced through the mouth and advanced to the second portion of the duodenum without difficulty or limitations. The mucosal surfaces were surveyed very carefully during advancement of the scope and upon withdrawal.  Findings:  Esophagus:  Mucosa of the esophagus was normal. Noncritical ring noted at GE junction. GEJ:  35 cm Hiatus:  38 cm Stomach:  Stomach was empty and distended very well with insufflation. Folds in the proximal stomach were unremarkable. Mucosa at gastric body, antrum, pyloric channel, angularis, fundus and cardia was normal. Duodenum:  Mucosa of proximal bulb was normal. Distally there was a scar with  central superficial ulcer no more than 2-3 mm with a clean base. Duodenal stricture noted at this level I was able to pass the scope across it. Post bulbar duodenal mucosa was normal.  Therapeutic/Diagnostic Maneuvers Performed:  None  Complications:  None  Impression: Small sliding hiatal hernia with noncritical Schatzki's ring which was not manipulated. Distal bulbar ulcer has healed at least 90% and no stigmata of bleed noted. Duodenal stricture at angle of the duodenum allowing passage of scope distally.  Recommendations:  Advance diet. Change pantoprazole to 40 mg by mouth twice a day. Imodium 4 mg by mouth every morning. Tissue transglutaminase IgA antibody given history of chronic diarrhea.   REHMAN,NAJEEB U  03/28/2013  2:45 PM  CC: Dr. Ernestine Conrad, MD & Dr. Bonnetta Barry ref. provider found

## 2013-03-29 LAB — CBC
HCT: 29 % — ABNORMAL LOW (ref 36.0–46.0)
Hemoglobin: 9.6 g/dL — ABNORMAL LOW (ref 12.0–15.0)
MCH: 29.2 pg (ref 26.0–34.0)
MCHC: 33.1 g/dL (ref 30.0–36.0)
MCV: 88.1 fL (ref 78.0–100.0)
Platelets: 215 10*3/uL (ref 150–400)
RBC: 3.29 MIL/uL — ABNORMAL LOW (ref 3.87–5.11)
RDW: 13.9 % (ref 11.5–15.5)
WBC: 4.3 10*3/uL (ref 4.0–10.5)

## 2013-03-29 LAB — TISSUE TRANSGLUTAMINASE, IGA: Tissue Transglutaminase Ab, IgA: 2.6 U/mL (ref <20)

## 2013-03-29 MED ORDER — POLYVINYL ALCOHOL 1.4 % OP SOLN
1.0000 [drp] | OPHTHALMIC | Status: DC | PRN
  Administered 2013-03-29: 1 [drp] via OPHTHALMIC
  Filled 2013-03-29: qty 15

## 2013-03-29 NOTE — Evaluation (Signed)
Physical Therapy Evaluation Patient Details Name: Renee Blake MRN: [id number] DOB: 1928/11/06 Today's Date: 03/29/2013 Time: 1540-1606 PT Time Calculation (min): 26 min  PT Assessment / Plan / Recommendation History of Present Illness  Pt is in hospital again with c/o diarrhea for which she had a syncopal episode due to dehydration.  She has had a recent GI bleed and states that she has recently lost a lot of weight.  She c/o generalized weakness with fatigue.  she is normally independent at home./  Clinical Impression   Pt was seen for evaluation and found to be mildly deconditioned from recent illness.  She is able to ambulate functional distances with no assistive device and no instability, however. I had recommended OP PT for follow up but staff has informed me that daughter has requested HHPT instead.    PT Assessment  All further PT needs can be met in the next venue of care    Follow Up Recommendations  Home health PT;Outpatient PT (per pt preference)    Does the patient have the potential to tolerate intense rehabilitation      Barriers to Discharge        Equipment Recommendations  None recommended by PT    Recommendations for Other Services     Frequency      Precautions / Restrictions Precautions Precautions: None Restrictions Weight Bearing Restrictions: No   Pertinent Vitals/Pain       Mobility  Bed Mobility Bed Mobility: Supine to Sit Supine to Sit: 7: Independent Transfers Transfers: Sit to Stand;Stand to Sit Sit to Stand: 6: Modified independent (Device/Increase time);From bed Stand to Sit: 6: Modified independent (Device/Increase time);To chair/3-in-1 Ambulation/Gait Ambulation/Gait Assistance: 5: Supervision Ambulation Distance (Feet): 300 Feet Assistive device: None Gait Pattern: Within Functional Limits (right hip internally rotates to a small degree) Gait velocity: very slow...cued to increase pace which she was able to do with no  problem Stairs: No Wheelchair Mobility Wheelchair Mobility: No    Exercises     PT Diagnosis: Difficulty walking;Generalized weakness  PT Problem List: Decreased strength PT Treatment Interventions:       PT Goals(Current goals can be found in the care plan section) Acute Rehab PT Goals PT Goal Formulation: No goals set, d/c therapy  Visit Information  Last PT Received On: 03/29/13 History of Present Illness: Pt is in hospital again with c/o diarrhea for which she had a syncopal episode due to dehydration.  She has had a recent GI bleed and states that she has recently lost a lot of weight.  She c/o generalized weakness with fatigue.  she is normally independent at home./       Prior Functioning  Home Living Family/patient expects to be discharged to:: Private residence    Cognition  Cognition Arousal/Alertness: Awake/alert Behavior During Therapy: WFL for tasks assessed/performed Overall Cognitive Status: Within Functional Limits for tasks assessed    Extremity/Trunk Assessment Lower Extremity Assessment Lower Extremity Assessment: Generalized weakness   Balance Balance Balance Assessed: Yes High Level Balance High Level Balance Activites: Backward walking;Turns;Sudden stops;Head turns;Direction changes High Level Balance Comments: no LOB with above  End of Session PT - End of Session Equipment Utilized During Treatment: Gait belt Activity Tolerance: Patient tolerated treatment well Patient left: in chair;with call bell/phone within reach (CNA to set chair alarm) Nurse Communication: Mobility status  GP     Konrad Penta 03/29/2013, 4:35 PM

## 2013-03-29 NOTE — Progress Notes (Signed)
Subjective; Patient denies melena or rectal bleeding. She had 3 loose stools since seen yesterday. Her appetite is fair. She denies nausea vomiting abdominal pain.  Objective; BP 149/65  Pulse 63  Temp(Src) 97.8 F (36.6 C) (Oral)  Resp 20  Ht 5\' 6"  (1.676 m)  Wt 122 lb 8 oz (55.566 kg)  BMI 19.78 kg/m2  SpO2 97% Patient is alert and in no acute distress. Abdomen symmetrical soft and nontender without organomegaly or masses. No LE noted.  Lab data; WBC 4.3, H&H 9.6 and 29 and platelet count 215K. Second stool specimen is also guaiac negative. Tissue transglutaminase antibody pending.  Records from [facility] reviewed; EGD on 01/30/2004 reveals bulbar ulcer with a clot in duodenal stricture. Colonoscopy from 11/09/2012 was normal. Biopsy from ascending and sigmoid colon reveal changes of microscopic colitis.  Assessment; #1. History of melena. Melena reported prior to admission but none during hospitalization and to stools are guaiac negative. EGD two days ago revealed 90% healed duodenal ulcer. H&H is strong but remains stable. If she has further episodes of melena she will need small bowel given capsule study.  Patient reminded repeatedly not to take NSAIDs as she has done in the past. #2. Chronic diarrhea. Symptoms suggestive of irritable bowel syndrome. No duodenal mucosal changes suggesting of disease. Tissue transglutaminase antibodies pending. Since she was found to have microscopic colitis on colonoscopy of August 2014 it would be reasonable to treat her and see if diarrhea gets better.  Recommendations; Continue pantoprazole 40 mg by mouth twice a day for 4 weeks and then once daily. Continue Imodium 4 mg by mouth every morning. Entocort EC can be initiated at the time of discharge as follows; 9 mg daily for two-weeks; 6 mg daily for 2 weeks; 3 mg daily for 2 weeks and stop. She should also take 4 g of fiber supplement daily. Office visit in 8 weeks.

## 2013-03-29 NOTE — Care Management Note (Addendum)
    Page 1 of 2   03/31/2013     2:09:20 PM   CARE MANAGEMENT NOTE 03/31/2013  Patient:  Renee Blake, Renee Blake   Account Number:  [id number]  Date Initiated:  03/29/2013  Documentation initiated by:  Sharrie Rothman  Subjective/Objective Assessment:   Pt admitted from home with rectal bleeding. Pt lives with her daughter and will return home at discharge. Pt is fairly independent with ADL's.     Action/Plan:   Pt would like HH RN and PT with [facility] at discharge. PT does not need any DME at this time. Will continue to follow for discharge planning needs.   Anticipated DC Date:  03/30/2013   Anticipated DC Plan:  HOME W HOME HEALTH SERVICES      DC Planning Services  CM consult      [facility] Choice  HOME HEALTH   Choice offered to / List presented to:  C-1 Patient        HH arranged  HH-1 RN  HH-2 PT      [facility] agency  [facility]   Status of service:  Completed, signed off Medicare Important Message given?  YES (If response is "NO", the following Medicare IM given date fields will be blank) Date Medicare IM given:  03/31/2013 Date Additional Medicare IM given:    Discharge Disposition:  HOME W HOME HEALTH SERVICES  Per UR Regulation:    If discussed at Long Length of Stay Meetings, dates discussed:    Comments:  03/31/13 1405 Arlyss Queen, RN BSN CM Pt discharged home today with [facility] RN and PT. Alroy Bailiff of [facility] is aware and will collect the pts information from the chart. HH services to start within 48 hours of discharge. No DME needs noted. Pt and pts nurse aware of discharge arrangements.  03/29/13 1330 Arlyss Queen, RN BSN CM

## 2013-03-29 NOTE — Progress Notes (Signed)
UR chart review completed.  

## 2013-03-29 NOTE — Progress Notes (Signed)
[facility] PROGRESS NOTE  LING FLESCH ZOX:[id number] DOB: Dec 16, 1928 DOA: 03/26/2013 PCP: Ernestine Conrad, MD  Assessment/Plan: 1. Melena. Etiology unclear, no recurrence, stool guaiac negative. EGD 12/28 revealed 90% healed duodenal ulcer. Hemoglobin has been stable. 2. Acute blood loss anemia: Etiology unclear, presumably from acute GI bleed of unclear etiology. Hemoglobin remained stable. 3. Possible syncope likely vasovagal, dehydration, anemia. 2-D echocardiogram unremarkable. 4. Acute renal failure resolved.  Possibly secondary to hypotension.  5. History of duodenal ulcer treated endoscopically <1 monthly 6. Generalized weakness. He did well with physical therapy. 7. Hypertension, stable 8. Chronic diarrhea, microscopic colitis.   If bleeding recurs consider small bowel capsule endoscopy  No NSAIDs  Protonix twice a day for 4 weeks, then once daily  Imodium 4 mg every morning  Entocort EC can be initiated at the time of discharge as follows; 9 mg daily for two-weeks; 6 mg daily for 2 weeks; 3 mg daily for 2 weeks and stop. She should also take 4 g of fiber supplement daily. Office visit in 8 weeks.  Discussed in detail with daughter and patient bedside. Observe overnight. Anticipate discharge 12/30 to home.  Pending studies:   none  Code Status: full code DVT prophylaxis: SCDs Family Communication: as above Disposition Plan: home when improved  Brendia Sacks, MD  [facility]  Pager [phone] If 7PM-7AM, please contact night-coverage at [email or web address], password [facility] 03/29/2013, 2:43 PM  LOS: 3 days   Summary: 79 year old woman recently treated endoscopically for bleeding duodenal ulcer presenting with generalized weakness, dark tarry stools and possible syncope.  Consultants:  Gastroenterology   Physical therapy: Home health PT  Procedures:  EGD: Small sliding hiatal hernia with noncritical Schatzki's ring which was not manipulated. Distal  bulbar ulcer has healed at least 90% and no stigmata of bleed noted. Duodenal stricture at angle of the duodenum allowing passage of scope distally.  2-D echocardiogram: Left ventricular ejection fraction 65-70%. Normal wall motion. Grade 1 diastolic dysfunction   HPI/Subjective: Feels weak but overall okay. No abdominal pain. No bleeding.  Objective: Filed Vitals:   03/28/13 2029 03/29/13 0416 03/29/13 0417 03/29/13 0418  BP: 150/59 123/66 130/70 149/65  Pulse: 60 54 61 63  Temp: 98.2 F (36.8 C) 97.8 F (36.6 C)    TempSrc: Oral Oral    Resp: 20 20 20 20   Height:      Weight:  55.566 kg (122 lb 8 oz)    SpO2: 100% 97% 99% 97%    Intake/Output Summary (Last 24 hours) at 03/29/13 1443 Last data filed at 03/29/13 0840  Gross per 24 hour  Intake   1320 ml  Output   2001 ml  Net   -681 ml     Filed Weights   03/28/13 0500 03/28/13 1306 03/29/13 0416  Weight: 55.566 kg (122 lb 8 oz) 53.434 kg (117 lb 12.8 oz) 55.566 kg (122 lb 8 oz)    Exam:   Afebrile, vital signs stable  General: Appears calm and comfortable. Nontoxic.  Cardiovascular: Regular rate and rhythm. No murmur, rub or gallop.  Respiratory: Clear to auscultation bilaterally. No wheezes, rales or rhonchi. Normal respiratory effort.  Abdomen: Soft, nontender, nondistended.  Data Reviewed:  Hemoglobin stable, 9.6  Scheduled Meds: . atenolol  50 mg Oral BID  . loperamide  4 mg Oral QAC breakfast  . pantoprazole  40 mg Oral BID AC  . potassium chloride  10 mEq Oral QHS  . potassium chloride  20 mEq Oral Daily  . simvastatin  10 mg Oral q1800  . sodium chloride  3 mL Intravenous Q12H   Continuous Infusions: . sodium chloride      Principal Problem:   Syncope Active Problems:   Hypertension   Rectal bleeding   Dehydration   Generalized weakness   Time spent 25 minutes

## 2013-03-29 NOTE — Progress Notes (Signed)
Patient has ambulated in hallway 3 times this shift. Sitting up in chair at this time. Tolerated well. No complaints voiced.

## 2013-03-30 ENCOUNTER — Encounter (HOSPITAL_COMMUNITY): Payer: Self-pay | Admitting: Internal Medicine

## 2013-03-30 ENCOUNTER — Telehealth: Payer: Self-pay | Admitting: Gastroenterology

## 2013-03-30 DIAGNOSIS — K269 Duodenal ulcer, unspecified as acute or chronic, without hemorrhage or perforation: Secondary | ICD-10-CM

## 2013-03-30 LAB — TYPE AND SCREEN
ABO/RH(D): O NEG
Antibody Screen: NEGATIVE
Unit division: 0
Unit division: 0

## 2013-03-30 LAB — CBC
HCT: 31 % — ABNORMAL LOW (ref 36.0–46.0)
Hemoglobin: 10.3 g/dL — ABNORMAL LOW (ref 12.0–15.0)
MCH: 29.4 pg (ref 26.0–34.0)
MCHC: 33.2 g/dL (ref 30.0–36.0)
MCV: 88.6 fL (ref 78.0–100.0)
Platelets: 269 10*3/uL (ref 150–400)
RBC: 3.5 MIL/uL — ABNORMAL LOW (ref 3.87–5.11)
RDW: 13.8 % (ref 11.5–15.5)
WBC: 5.1 10*3/uL (ref 4.0–10.5)

## 2013-03-30 LAB — OCCULT BLOOD X 1 CARD TO LAB, STOOL: Fecal Occult Bld: POSITIVE — AB

## 2013-03-30 MED ORDER — BUDESONIDE 3 MG PO CP24
9.0000 mg | ORAL_CAPSULE | Freq: Every day | ORAL | Status: DC
  Administered 2013-03-30 – 2013-03-31 (×2): 9 mg via ORAL
  Filled 2013-03-30 (×4): qty 3

## 2013-03-30 NOTE — Progress Notes (Signed)
[facility] PROGRESS NOTE  Renee Blake ZOX:[id number] DOB: 07/19/1928 DOA: 03/26/2013 PCP: Ernestine Conrad, MD  Assessment/Plan: 1. Melena. Etiology unclear, no recurrence, stool guaiac now positive for the first time. EGD 12/28 revealed 90% healed duodenal ulcer. Hemoglobin has been stable. Repeat CBC today, await further GI comment. 2. Acute blood loss anemia: Etiology unclear, presumably from acute GI bleed of unclear etiology. Hemoglobin remained stable. 3. Possible syncope likely vasovagal, dehydration, anemia. 2-D echocardiogram unremarkable. No recurrence. 4. Acute renal failure resolved.  Possibly secondary to hypotension.  5. History of duodenal ulcer treated endoscopically <1 month ago. 6. Generalized weakness. Did well with physical therapy. Will stronger today. 7. Hypertension, stable 8. Chronic diarrhea, microscopic colitis. Plan as below.   If bleeding recurs consider small bowel capsule endoscopy  No NSAIDs  Protonix twice a day for 4 weeks, then once daily  Imodium 4 mg every morning  Entocort EC can be initiated at the time of discharge as follows; 9 mg daily for two-weeks; 6 mg daily for 2 weeks; 3 mg daily for 2 weeks and stop. She should also take 4 g of fiber supplement daily. Office visit in 8 weeks.  Repeat CBC. Await further GI recommendations. No evidence of significant bleeding at this point.  Discussed above in detail with daughter at bedside.  Monitor overnight, repeat CBC in the morning. Possible discharge 12/31.  Pending studies:   none  Code Status: full code DVT prophylaxis: SCDs Family Communication: as above Disposition Plan: home when improved  Brendia Sacks, MD  [facility]  Pager [phone] If 7PM-7AM, please contact night-coverage at [email or web address], password [facility] 03/30/2013, 11:39 AM  LOS: 4 days   Summary: 79 year old woman recently treated endoscopically for bleeding duodenal ulcer presenting with generalized  weakness, dark tarry stools and possible syncope.  Consultants:  Gastroenterology   Physical therapy: Home health PT  Procedures:  EGD: Small sliding hiatal hernia with noncritical Schatzki's ring which was not manipulated. Distal bulbar ulcer has healed at least 90% and no stigmata of bleed noted. Duodenal stricture at angle of the duodenum allowing passage of scope distally.  2-D echocardiogram: Left ventricular ejection fraction 65-70%. Normal wall motion. Grade 1 diastolic dysfunction   HPI/Subjective: Feels better, stronger. No bleeding. 2 episodes of diarrhea this morning. No abdominal pain. Guaiac of stool was positive this morning.  Objective: Filed Vitals:   03/29/13 2157 03/30/13 0605 03/30/13 0606 03/30/13 0607  BP: 175/72 181/84 175/82 165/84  Pulse: 63 63 60 65  Temp: 98.4 F (36.9 C) 98.2 F (36.8 C) 98.3 F (36.8 C)   TempSrc: Oral Oral Oral Oral  Resp: 18 19 19 20   Height:      Weight:    55.2 kg (121 lb 11.1 oz)  SpO2:  100% 100% 100%    Intake/Output Summary (Last 24 hours) at 03/30/13 1139 Last data filed at 03/30/13 0858  Gross per 24 hour  Intake    360 ml  Output    651 ml  Net   -291 ml     Filed Weights   03/28/13 1306 03/29/13 0416 03/30/13 0607  Weight: 53.434 kg (117 lb 12.8 oz) 55.566 kg (122 lb 8 oz) 55.2 kg (121 lb 11.1 oz)    Exam:   Afebrile, vital signs stable. No hypoxia.  General: Appears calm and comfortable. Appears stronger today.  Cardiovascular: Regular rate and rhythm. No murmur, rub or gallop.  Respiratory: Clear to auscultation bilaterally. No wheezes, rales or rhonchi. Normal respiratory effort.  Abdomen:  Soft, nontender, nondistended.  Data Reviewed:  Fecal occult blood positive  Scheduled Meds: . atenolol  50 mg Oral BID  . loperamide  4 mg Oral QAC breakfast  . pantoprazole  40 mg Oral BID AC  . potassium chloride  10 mEq Oral QHS  . potassium chloride  20 mEq Oral Daily  . simvastatin  10 mg Oral  q1800  . sodium chloride  3 mL Intravenous Q12H   Continuous Infusions:    Principal Problem:   Syncope Active Problems:   Hypertension   Rectal bleeding   Dehydration   Generalized weakness   Time spent 25 minutes

## 2013-03-30 NOTE — Progress Notes (Signed)
REVIEWED. AGREE. 

## 2013-03-30 NOTE — Progress Notes (Signed)
    Subjective: Denies abdominal pain, N/V, melena, hematochezia. 2 small, loose stool today (am and pm). Overall improved from admission.   Objective: Vital signs in last 24 hours: Temp:  [97.9 F (36.6 C)-98.4 F (36.9 C)] 97.9 F (36.6 C) (12/30 1458) Pulse Rate:  [60-70] 70 (12/30 1458) Resp:  [18-20] 20 (12/30 1458) BP: (155-181)/(72-84) 155/80 mmHg (12/30 1458) SpO2:  [100 %] 100 % (12/30 1458) Weight:  [121 lb 11.1 oz (55.2 kg)] 121 lb 11.1 oz (55.2 kg) (12/30 0607) Last BM Date: 03/30/13 General:   Alert and oriented, pleasant Head:  Normocephalic and atraumatic. Eyes:  No icterus, sclera clear. Conjuctiva pink.  Heart:  S1, S2 present, no murmurs noted.  Abdomen:  Bowel sounds present, soft, non-tender, non-distended. No HSM or hernias noted. No rebound or guarding. No masses appreciated  Extremities:  Without clubbing or edema. Neurologic:  Alert and  oriented x4;  grossly normal neurologically.   Intake/Output from previous day: 12/29 0701 - 12/30 0700 In: 360 [P.O.:360] Out: 802 [Urine:800; Stool:2] Intake/Output this shift: Total I/O In: 360 [P.O.:360] Out: 200 [Urine:200]  Lab Results:  Recent Labs  03/28/13 1648 03/29/13 0459 03/30/13 1511  WBC 4.2 4.3 5.1  HGB 10.6* 9.6* 10.3*  HCT 31.9* 29.0* 31.0*  PLT 242 215 269   BMET  Recent Labs  03/28/13 0604  NA 140  K 3.4*  CL 109  CO2 21  GLUCOSE 94  BUN 14  CREATININE 0.97  CALCIUM 8.7     Assessment: 79 year old female with historical melena prior to admission, EGD with 90% healed duodenal ulcer while inpatient. Heme positive now. Anemia stable, Hgb improved. No overt signs of GI bleeding or need for invasive procedure while inpatient.   Chronic diarrhea, differentials to include microscopic colitis, IBS. Notably, hx of microscopic colitis in Aug 2014. Entocort to be started at discharge. TTg,IgA negative. No IgA on file to check for IgA deficiency, which could help rule out a false  negative.   Although she has had one documented heme positive stool, there is no overt signs of concern such as melena or hematochezia. Hgb actually improved. Patient appropriate for discharge 12/31 with close outpatient follow-up.   Plan: Small bowel capsule study if further melena Entocort as outpatient 9 mg X 2 weeks, 6 mg X 2 weeks, 3 mg X 2 weeks then stop Fiber supplementation daily Check serum IgA Follow-up with Dr. Karilyn Cota in 2-4 weeks. Appropriate for discharge 12/31. Will sign off.  Nira Retort, ANP-BC [facility]     LOS: 4 days    03/30/2013, 4:10 PM

## 2013-03-30 NOTE — Telephone Encounter (Signed)
Patient soon to be discharged.  Needs to follow-up with Dr. Karilyn Cota in about 4 weeks.  Thanks!

## 2013-03-31 ENCOUNTER — Encounter: Payer: Self-pay | Admitting: Internal Medicine

## 2013-03-31 ENCOUNTER — Encounter: Payer: Self-pay | Admitting: *Deleted

## 2013-03-31 DIAGNOSIS — R197 Diarrhea, unspecified: Secondary | ICD-10-CM

## 2013-03-31 LAB — IGA: IgA: 178 mg/dL (ref 69–380)

## 2013-03-31 MED ORDER — BUDESONIDE 3 MG PO CP24: ORAL_CAPSULE | ORAL | Status: DC

## 2013-03-31 MED ORDER — ZOLPIDEM TARTRATE 5 MG PO TABS: 5.0000 mg | ORAL_TABLET | Freq: Every evening | ORAL | Status: DC | PRN

## 2013-03-31 NOTE — Progress Notes (Signed)
[facility] PROGRESS NOTE  Renee Blake:[id number] DOB: [birth year] DOA: 03/26/2013 PCP: Ernestine Conrad, MD  Assessment/Plan: 1. Melena. Etiology unclear, no recurrence. EGD 12/28 revealed 90% healed duodenal ulcer. Hemoglobin has been stable. Clear for discharge per GI. 2. Acute blood loss anemia: Etiology unclear, presumably from acute GI bleed of unclear etiology. Hemoglobin remained stable. 3. Possible syncope likely vasovagal, dehydration, anemia. 2-D echocardiogram unremarkable. No recurrence. 4. Acute renal failure resolved.  Possibly secondary to hypotension.  5. History of duodenal ulcer treated endoscopically <1 month ago. 6. Generalized weakness. Did well with physical therapy.  7. Hypertension, stable 8. Chronic diarrhea, microscopic colitis. Plan as below.   If bleeding recurs consider small bowel capsule endoscopy  No NSAIDs  Protonix twice a day for 4 weeks, then once daily  Imodium 4 mg every morning  Entocort EC 9 mg daily for two-weeks; 6 mg daily for 2 weeks; 3 mg daily for 2 weeks and stop. She should also take 4 g of fiber supplement daily. Office visit in 8 weeks.  Home with home health RN PT  Code Status: full code DVT prophylaxis: SCDs Family Communication: as above Disposition Plan: home when improved  Brendia Sacks, MD  [facility]  Pager [phone] If 7PM-7AM, please contact night-coverage at [email or web address], password [email or web address] 03/31/2013, 3:25 PM  LOS: 5 days   Summary: 79 year old woman recently treated endoscopically for bleeding duodenal ulcer presenting with generalized weakness, dark tarry stools and possible syncope.  Consultants:  Gastroenterology   Physical therapy: Home health PT  Procedures:  EGD: Small sliding hiatal hernia with noncritical Schatzki's ring which was not manipulated. Distal bulbar ulcer has healed at least 90% and no stigmata of bleed noted. Duodenal stricture at angle of the duodenum allowing passage  of scope distally.  2-D echocardiogram: Left ventricular ejection fraction 65-70%. Normal wall motion. Grade 1 diastolic dysfunction   HPI/Subjective: Feels good. Ambulating better. No bleeding. Eating well. No new issues.  Objective: Filed Vitals:   03/31/13 0531 03/31/13 0533 03/31/13 0853 03/31/13 1434  BP: 157/65 158/66  160/67  Pulse: 70 59 62 59  Temp:    98.3 F (36.8 C)  TempSrc:    Oral  Resp:    20  Height:      Weight:  55.792 kg (123 lb)    SpO2: 99% 96%  98%    Intake/Output Summary (Last 24 hours) at 03/31/13 1525 Last data filed at 03/31/13 1245  Gross per 24 hour  Intake    600 ml  Output    350 ml  Net    250 ml     Filed Weights   03/29/13 0416 03/30/13 0607 03/31/13 0533  Weight: 55.566 kg (122 lb 8 oz) 55.2 kg (121 lb 11.1 oz) 55.792 kg (123 lb)    Exam:   Afebrile, vital signs stable. No hypoxia.  General: Well-appearing.  Cardiovascular: Regular rate and rhythm. No murmur, rub or gallop.  Respiratory: Clear to auscultation bilaterally. No wheezes, rales or rhonchi. Normal respiratory effort.  Data Reviewed:  Hemoglobin 10.3 yesterday  Scheduled Meds: . atenolol  50 mg Oral BID  . budesonide  9 mg Oral Daily  . loperamide  4 mg Oral QAC breakfast  . pantoprazole  40 mg Oral BID AC  . potassium chloride  10 mEq Oral QHS  . potassium chloride  20 mEq Oral Daily  . simvastatin  10 mg Oral q1800  . sodium chloride  3 mL Intravenous Q12H   Continuous Infusions:  Principal Problem:   Syncope Active Problems:   Hypertension   Rectal bleeding   Dehydration   Generalized weakness

## 2013-03-31 NOTE — Discharge Summary (Signed)
Physician Discharge Summary  Renee Blake UJW:[id number] DOB: [birth year] DOA: 03/26/2013  PCP: Ernestine Conrad, MD  Admit date: 03/26/2013 Discharge date: 03/31/2013  For recurrent bleeding consider capsule endoscopy  Recommendations for Outpatient Follow-up:  1. Followup GI bleed of unclear etiology with GI in 8 weeks 2. Followup empiric treatment for microscopic colitis 3. Followup acute blood loss anemia 4. Continue to follow healing duodenal ulcer  Follow-up Information   Follow up with [facility].   Contact information:   [address] [place] [address] [phone]     Discharge Diagnoses:  1. Melena, GI bleed 2. Acute blood loss anemia 3. Possible syncope 4. Acute renal failure 5. History of duodenal ulcer treated endoscopically last 1 month ago 6. Generalized weakness 7. History microscopic colitis  Discharge Condition: Improved Disposition: Home  Diet recommendation: Regular  Filed Weights   03/29/13 0416 03/30/13 0607 03/31/13 0533  Weight: 55.566 kg (122 lb 8 oz) 55.2 kg (121 lb 11.1 oz) 55.792 kg (123 lb)    History of present illness:  79 year old woman recently treated endoscopically for bleeding duodenal ulcer presenting with generalized weakness, dark tarry stools and possible syncope.  Hospital Course:  Ms. Michna was admitted for further evaluation GI bleed and seen in consultation with gastroenterology. She had no bleeding during admission and did not require blood products. She underwent EGD which revealed healing ulcer. She has a history of microscopic colitis and GI recommended initiating treatment. Hemoglobin has remained stable she is now stable for discharge. Individual issues as below.  1. Melena. Etiology unclear, no recurrence. EGD 12/28 revealed 90% healed duodenal ulcer. Hemoglobin has been stable. Clear for discharge per GI. 2. Acute blood loss anemia: Etiology unclear, presumably from acute GI bleed of unclear etiology.  Hemoglobin stable. 3. Possible syncope likely vasovagal, dehydration, anemia. 2-D echocardiogram unremarkable. No recurrence. 4. Acute renal failure resolved. Possibly secondary to hypotension.  5. History of duodenal ulcer treated endoscopically <1 month ago. 6. Generalized weakness. Did well with physical therapy.  7. Hypertension, stable 8. Chronic diarrhea, microscopic colitis. Plan as below.  If bleeding recurs consider small bowel capsule endoscopy  No NSAIDs  Protonix twice a day for 4 weeks, then once daily  Imodium 4 mg every morning Entocort EC 9 mg daily for two-weeks; 6 mg daily for 2 weeks; 3 mg daily for 2 weeks and stop. She should also take 4 g of fiber supplement daily. Office visit in 8 weeks.  Home with home health RN PT  Consultants:  Gastroenterology  Physical therapy: Home health PT Procedures:  EGD: Small sliding hiatal hernia with noncritical Schatzki's ring which was not manipulated. Distal bulbar ulcer has healed at least 90% and no stigmata of bleed noted. Duodenal stricture at angle of the duodenum allowing passage of scope distally. 2-D echocardiogram: Left ventricular ejection fraction 65-70%. Normal wall motion. Grade 1 diastolic dysfunction   Discharge Instructions  Discharge Orders   Future Appointments Provider Department Dept Phone   05/19/2013 11:15 AM Terressa Koyanagi, DO [facility] [place] [phone]   05/25/2013 2:45 PM Hart Carwin, MD [facility] [phone]   Future Orders Complete By Expires   Diet - low sodium heart healthy  As directed    Discharge instructions  As directed    Comments:     Call your physician or seek immediate medical attention for bleeding, pain or worsening of condition. Do not take aspirin, Motrin, ibuprofen, Aleve, BC powders or other NSAIDs. Take fiber  supplement daily.   Increase activity slowly  As directed        Medication List    STOP taking these medications        triamterene-hydrochlorothiazide 37.5-25 MG per tablet  Commonly known as:  MAXZIDE-25      TAKE these medications       atenolol 50 MG tablet  Commonly known as:  TENORMIN  Take 50 mg by mouth 2 (two) times daily.     budesonide 3 MG 24 hr capsule  Commonly known as:  ENTOCORT EC  9 mg daily for two weeks then 6 mg daily for 2 weeks then 3 mg daily for 2 weeks and stop.     EYE DROPS OP  Apply 2 drops to eye daily as needed (dry eyes).     lisinopril 10 MG tablet  Commonly known as:  PRINIVIL,ZESTRIL  Take 10 mg by mouth every morning.     loperamide 2 MG tablet  Commonly known as:  IMODIUM A-D  Take 4 mg by mouth every morning.     lovastatin 20 MG tablet  Commonly known as:  MEVACOR  Take 20 mg by mouth at bedtime.     meclizine 25 MG tablet  Commonly known as:  ANTIVERT  Take 25 mg by mouth 2 (two) times daily as needed for dizziness (VERTIGO).     pantoprazole 40 MG tablet  Commonly known as:  PROTONIX  Take 40 mg by mouth 2 (two) times daily.     potassium chloride 10 MEQ tablet  Commonly known as:  K-DUR,KLOR-CON  Take 10-20 mEq by mouth 2 (two) times daily. Two tablets take in the morning and one tablet taken in the evening     traMADol 50 MG tablet  Commonly known as:  ULTRAM  Take 50 mg by mouth 4 (four) times daily as needed for moderate pain or severe pain.     zolpidem 5 MG tablet  Commonly known as:  AMBIEN  Take 1 tablet (5 mg total) by mouth at bedtime as needed for sleep.       Allergies  Allergen Reactions  . Codeine Other (See Comments)    Reaction unknown  . Sulfa Antibiotics Other (See Comments)    Reactions unknown    The results of significant diagnostics from this hospitalization (including imaging, microbiology, ancillary and laboratory) are listed below for reference.    Significant Diagnostic Studies: No results found.  Microbiology: Recent Results (from the past 240 hour(s))  CLOSTRIDIUM DIFFICILE BY PCR     Status: None    Collection Time    03/27/13  3:00 AM      Result Value Range Status   C difficile by pcr NEGATIVE  NEGATIVE Final     Labs: Basic Metabolic Panel:  Recent Labs Lab 03/26/13 1542 03/27/13 0249 03/28/13 0604  NA 138 137 140  K 4.2 3.7 3.4*  CL 101 102 109  CO2 26 22 21   GLUCOSE 110* 124* 94  BUN 17 19 14   CREATININE 0.85 1.34* 0.97  CALCIUM 10.0 9.2 8.7   Liver Function Tests:  Recent Labs Lab 03/26/13 1542  AST 15  ALT 9  ALKPHOS 71  BILITOT 0.3  PROT 7.5  ALBUMIN 3.9   CBC:  Recent Labs Lab 03/26/13 1542 03/27/13 0249 03/27/13 1613 03/28/13 0604 03/28/13 1648 03/29/13 0459 03/30/13 1511  WBC 12.5* 7.3  --  5.0 4.2 4.3 5.1  NEUTROABS 10.2*  --   --   --   --   --   --  HGB 12.6 10.9* 10.0* 9.7* 10.6* 9.6* 10.3*  HCT 38.0 33.1* 30.6* 29.3* 31.9* 29.0* 31.0*  MCV 89.4 89.2  --  89.3 89.6 88.1 88.6  PLT 307 260  --  212 242 215 269   Cardiac Enzymes:  Recent Labs Lab 03/26/13 2018 03/27/13 0248 03/27/13 0813  TROPONINI <0.30 <0.30 <0.30    Principal Problem:   Syncope Active Problems:   Hypertension   Rectal bleeding   Dehydration   Generalized weakness   Time coordinating discharge: 35 minutes  Signed:  Brendia Sacks, MD [facility] 03/31/2013, 3:46 PM

## 2013-03-31 NOTE — Progress Notes (Signed)
Pt is to be discharged home today. Pt is in NAD, IV is out, all paperwork has been reviewed/discussed with patient, and there are no questions/concerns at this time. Assessment is unchanged from this morning. Pt is to be accompanied downstairs by staff and family via wheelchair.  

## 2013-04-13 NOTE — Telephone Encounter (Signed)
This message has been forwarded to Charna Busman to make this appt.  Thanks

## 2013-04-14 NOTE — Telephone Encounter (Signed)
Apt has been scheduled for 05/11/13 with Deberah Castle, NP.

## 2013-04-28 NOTE — Telephone Encounter (Signed)
Pt accepted, appt 05/25/13 with Dr. Olevia Perches

## 2013-05-11 ENCOUNTER — Encounter (INDEPENDENT_AMBULATORY_CARE_PROVIDER_SITE_OTHER): Payer: Self-pay | Admitting: Internal Medicine

## 2013-05-11 ENCOUNTER — Ambulatory Visit (INDEPENDENT_AMBULATORY_CARE_PROVIDER_SITE_OTHER): Payer: Medicare Other | Admitting: Internal Medicine

## 2013-05-11 VITALS — BP 132/70 | HR 84 | Temp 97.0°F | Ht 66.0 in | Wt 117.4 lb

## 2013-05-11 DIAGNOSIS — D62 Acute posthemorrhagic anemia: Secondary | ICD-10-CM

## 2013-05-11 DIAGNOSIS — K921 Melena: Secondary | ICD-10-CM

## 2013-05-11 NOTE — Progress Notes (Addendum)
Subjective:     Patient ID: Renee Blake, female   DOB: 1929/02/19, 80 y.o.   MRN: [id number]  HPI Here today for f/u. Recent hospital admission for melena in December. She tells me her stools are brown now. She has not seen any black stools. Her appetite is much better.  She has gained 4 pounds since her admission to the hospital. Please see EGD below.  She lives with her daughter.  She is not taking any NSAIDs.  She apparently had been taking Goody Powders for her headaches (2 a week).  She is gradually getting her strength back.  She denies any diarrhea. She is having one stool a day.  No diarrhea.  She tells me she feels 100% better.  CBC    Component Value Date/Time   WBC 5.1 03/30/2013 1511   RBC 3.50* 03/30/2013 1511   HGB 10.3* 03/30/2013 1511   HCT 31.0* 03/30/2013 1511   PLT 269 03/30/2013 1511   MCV 88.6 03/30/2013 1511   MCH 29.4 03/30/2013 1511   MCHC 33.2 03/30/2013 1511   RDW 13.8 03/30/2013 1511   LYMPHSABS 1.0 03/26/2013 1542   MONOABS 1.2* 03/26/2013 1542   EOSABS 0.1 03/26/2013 1542   BASOSABS 0.0 03/26/2013 1542      03/26/2013 EGD Dr. Rehman:Indications: Patient is an 80 year old Caucasian female who presents with history of melena and weakness. She was hospitalized at [facility] one month ago for 2 unit upper GI bleed secondary to bleeding from duodenal ulcer treated endoscopically. H. pylori serology was negative. She has history of taking Goody powder but denies use over the last 4 weeks. Patient's hemoglobin has dropped by almost 3 g since admission. She is undergoing diagnostic and possibly therapeutic EGD.   Impression:  Small sliding hiatal hernia with noncritical Schatzki's ring which was not manipulated.  Distal bulbar ulcer has healed at least 90% and no stigmata of bleed noted.  Duodenal stricture at angle of the duodenum allowing passage of scope distally.  02/27/2013 EGD: Dr. Fuller Plan.  1. Duodenal ulcer, actively bleeding; Bipolar (BICAP) cautery and   epi injection; complete hemostasis achieved  2. Duodenal bulb stenosis and deformity  3. Stricture at the gastroesophageal junction  4. Small hiatal hernia      Related Notes: Original Note by Vena Rua, PA-C (Physician Assistant) filed at 03/01/2013 2:55 PM    Synopsis of Colonoscopy reprrt 11/09/12 by  Dr Doristine Mango in [place] for unexplained diarrhea  No polyps, no diverticulosis.  Random biopsies: Microscopic colitis.  .procedures reviewed. D.brodie    Review of Systems see hpi Current Outpatient Prescriptions  Medication Sig Dispense Refill  . atenolol (TENORMIN) 50 MG tablet Take 50 mg by mouth 2 (two) times daily.      Marland Kitchen lisinopril (PRINIVIL,ZESTRIL) 10 MG tablet Take 10 mg by mouth every morning.      . loperamide (IMODIUM A-D) 2 MG tablet Take 4 mg by mouth every morning.       . lovastatin (MEVACOR) 20 MG tablet Take 20 mg by mouth at bedtime.      . meclizine (ANTIVERT) 25 MG tablet Take 25 mg by mouth 2 (two) times daily as needed for dizziness (VERTIGO).      Marland Kitchen pantoprazole (PROTONIX) 40 MG tablet Take 40 mg by mouth 2 (two) times daily.      . potassium chloride (K-DUR,KLOR-CON) 10 MEQ tablet Take 10-20 mEq by mouth 2 (two) times daily. Two tablets take in the morning and one tablet  taken in the evening      . Tetrahydrozoline HCl (EYE DROPS OP) Apply 2 drops to eye daily as needed (dry eyes).      . traMADol (ULTRAM) 50 MG tablet Take 50 mg by mouth 4 (four) times daily as needed for moderate pain or severe pain.        No current facility-administered medications for this visit.   Past Medical History  Diagnosis Date  . Hypertension   . Hyperlipemia   . Gastric ulcer   . Skin cancer   . Chronic diarrhea   . Vertigo   . Sliding hiatal hernia   . Schatzki's ring   . Duodenal stricture   . GI bleed   . Microscopic colitis   . Acute blood loss anemia   . Dilation of biliary tract     biliary duct per 02/26/13 CT  . Pancreatic duct dilated  02/26/13  . Nephrolithiasis   . Esophageal stricture   . Diabetes    Past Surgical History  Procedure Laterality Date  . Nose surgery      for skin cancer  . Esophagogastroduodenoscopy N/A 02/27/2013    Procedure: ESOPHAGOGASTRODUODENOSCOPY (EGD);  Surgeon: Ladene Artist, MD;  Location: [facility] ENDOSCOPY;  Service: Endoscopy;  Laterality: N/A;  . Hot hemostasis N/A 02/27/2013    Procedure: HOT HEMOSTASIS (ARGON PLASMA COAGULATION/BICAP);  Surgeon: Ladene Artist, MD;  Location: [facility] ENDOSCOPY;  Service: Endoscopy;  Laterality: N/A;  . Esophagogastroduodenoscopy N/A 03/28/2013    Procedure: ESOPHAGOGASTRODUODENOSCOPY (EGD);  Surgeon: Rogene Houston, MD;  Location: [facility] ENDO SUITE;  Service: Endoscopy;  Laterality: N/A;        Objective:   Physical Exam  Filed Vitals:   05/11/13 1440  BP: 132/70  Pulse: 84  Temp: 97 F (36.1 C)  Height: 5\' 6"  (1.676 m)  Weight: 117 lb 6.4 oz (53.252 kg)  Alert and oriented. Skin warm and dry. Oral mucosa is moist.   . Sclera anicteric, conjunctivae is pink. Thyroid not enlarged. No cervical lymphadenopathy. Lungs clear. Heart regular rate and rhythm.  Abdomen is soft. Bowel sounds are positive. No hepatomegaly. No abdominal masses felt. No tenderness.  No edema to lower extremities.  Stool brown and guaiac negative.      Assessment:     PUD. She feels better. Recent EGD revealed that ulcer had healed  90%    Plan:    Continue present medication. CBC today.  OV in 6 months with a CBC.

## 2013-05-11 NOTE — Patient Instructions (Signed)
OV in 6 months with a CBC 

## 2013-05-12 ENCOUNTER — Telehealth (INDEPENDENT_AMBULATORY_CARE_PROVIDER_SITE_OTHER): Payer: Self-pay | Admitting: *Deleted

## 2013-05-12 LAB — CBC
HCT: 38.9 % (ref 36.0–46.0)
Hemoglobin: 12.8 g/dL (ref 12.0–15.0)
MCH: 29 pg (ref 26.0–34.0)
MCHC: 32.9 g/dL (ref 30.0–36.0)
MCV: 88 fL (ref 78.0–100.0)
Platelets: 336 10*3/uL (ref 150–400)
RBC: 4.42 MIL/uL (ref 3.87–5.11)
RDW: 15.1 % (ref 11.5–15.5)
WBC: 8.1 10*3/uL (ref 4.0–10.5)

## 2013-05-12 NOTE — Telephone Encounter (Signed)
Ms Renee Blake would like for you to call her with results of her mom's labs when they come in -- she states she came with her to [place] on 05/11/13

## 2013-05-13 NOTE — Telephone Encounter (Signed)
Addressed.

## 2013-05-19 ENCOUNTER — Ambulatory Visit: Payer: Self-pay | Admitting: Family Medicine

## 2013-05-25 ENCOUNTER — Ambulatory Visit: Payer: Self-pay | Admitting: Internal Medicine

## 2013-06-07 ENCOUNTER — Inpatient Hospital Stay (HOSPITAL_COMMUNITY)
Admission: EM | Admit: 2013-06-07 | Discharge: 2013-06-09 | DRG: 293 | Disposition: A | Discharge status: Home Health | Payer: Medicare Other | Attending: Internal Medicine | Admitting: Internal Medicine

## 2013-06-07 ENCOUNTER — Emergency Department (HOSPITAL_COMMUNITY): Payer: Medicare Other

## 2013-06-07 ENCOUNTER — Inpatient Hospital Stay (HOSPITAL_COMMUNITY): Payer: Medicare Other

## 2013-06-07 ENCOUNTER — Encounter (HOSPITAL_COMMUNITY): Payer: Self-pay | Admitting: Emergency Medicine

## 2013-06-07 DIAGNOSIS — I5033 Acute on chronic diastolic (congestive) heart failure: Principal | ICD-10-CM | POA: Diagnosis present

## 2013-06-07 DIAGNOSIS — Z8249 Family history of ischemic heart disease and other diseases of the circulatory system: Secondary | ICD-10-CM

## 2013-06-07 DIAGNOSIS — I5031 Acute diastolic (congestive) heart failure: Secondary | ICD-10-CM | POA: Diagnosis present

## 2013-06-07 DIAGNOSIS — E119 Type 2 diabetes mellitus without complications: Secondary | ICD-10-CM | POA: Diagnosis present

## 2013-06-07 DIAGNOSIS — I10 Essential (primary) hypertension: Secondary | ICD-10-CM | POA: Diagnosis present

## 2013-06-07 DIAGNOSIS — E785 Hyperlipidemia, unspecified: Secondary | ICD-10-CM | POA: Diagnosis present

## 2013-06-07 DIAGNOSIS — R0600 Dyspnea, unspecified: Secondary | ICD-10-CM

## 2013-06-07 DIAGNOSIS — I509 Heart failure, unspecified: Secondary | ICD-10-CM | POA: Diagnosis present

## 2013-06-07 DIAGNOSIS — Z79899 Other long term (current) drug therapy: Secondary | ICD-10-CM

## 2013-06-07 DIAGNOSIS — Z87442 Personal history of urinary calculi: Secondary | ICD-10-CM

## 2013-06-07 DIAGNOSIS — Z85828 Personal history of other malignant neoplasm of skin: Secondary | ICD-10-CM

## 2013-06-07 DIAGNOSIS — E440 Moderate protein-calorie malnutrition: Secondary | ICD-10-CM

## 2013-06-07 LAB — CBC WITH DIFFERENTIAL/PLATELET
Basophils Absolute: 0 10*3/uL (ref 0.0–0.1)
Basophils Relative: 0 % (ref 0–1)
Eosinophils Absolute: 0.1 10*3/uL (ref 0.0–0.7)
Eosinophils Relative: 1 % (ref 0–5)
HCT: 38 % (ref 36.0–46.0)
Hemoglobin: 12.7 g/dL (ref 12.0–15.0)
Lymphocytes Relative: 15 % (ref 12–46)
Lymphs Abs: 1.8 10*3/uL (ref 0.7–4.0)
MCH: 30.2 pg (ref 26.0–34.0)
MCHC: 33.4 g/dL (ref 30.0–36.0)
MCV: 90.3 fL (ref 78.0–100.0)
Monocytes Absolute: 0.8 10*3/uL (ref 0.1–1.0)
Monocytes Relative: 7 % (ref 3–12)
Neutro Abs: 9.2 10*3/uL — ABNORMAL HIGH (ref 1.7–7.7)
Neutrophils Relative %: 77 % (ref 43–77)
Platelets: ADEQUATE 10*3/uL (ref 150–400)
RBC: 4.21 MIL/uL (ref 3.87–5.11)
RDW: 14 % (ref 11.5–15.5)
WBC: 12 10*3/uL — ABNORMAL HIGH (ref 4.0–10.5)

## 2013-06-07 LAB — GLUCOSE, CAPILLARY
Glucose-Capillary: 196 mg/dL — ABNORMAL HIGH (ref 70–99)
Glucose-Capillary: 156 mg/dL — ABNORMAL HIGH (ref 70–99)

## 2013-06-07 LAB — TROPONIN I
Troponin I: <0.3 ng/mL (ref <0.30)
Troponin I: <0.3 ng/mL (ref <0.30)
Troponin I: <0.3 ng/mL (ref <0.30)

## 2013-06-07 LAB — HEPATIC FUNCTION PANEL
ALT: 24 U/L (ref 0–35)
AST: 22 U/L (ref 0–37)
Albumin: 3.8 g/dL (ref 3.5–5.2)
Alkaline Phosphatase: 96 U/L (ref 39–117)
Bilirubin, Direct: <0.2 mg/dL (ref 0.0–0.3)
Total Bilirubin: 0.3 mg/dL (ref 0.3–1.2)
Total Protein: 7.6 g/dL (ref 6.0–8.3)

## 2013-06-07 LAB — BASIC METABOLIC PANEL
BUN: 15 mg/dL (ref 6–23)
CO2: 23 mEq/L (ref 19–32)
Calcium: 9.6 mg/dL (ref 8.4–10.5)
Chloride: 105 mEq/L (ref 96–112)
Creatinine, Ser: 0.75 mg/dL (ref 0.50–1.10)
GFR calc Af Amer: 89 mL/min — ABNORMAL LOW (ref >90)
GFR calc non Af Amer: 77 mL/min — ABNORMAL LOW (ref >90)
Glucose, Bld: 169 mg/dL — ABNORMAL HIGH (ref 70–99)
Potassium: 3.9 mEq/L (ref 3.7–5.3)
Sodium: 142 mEq/L (ref 137–147)

## 2013-06-07 LAB — PRO B NATRIURETIC PEPTIDE: Pro B Natriuretic peptide (BNP): 2396 pg/mL — ABNORMAL HIGH (ref 0–450)

## 2013-06-07 IMAGING — US US EXTREM LOW VENOUS BILAT
1 series · 14 of 24 positions shown · non-contrast
Comparison: None.

CLINICAL DATA: Bilateral lower extremity edema

EXAM:
BILATERAL LOWER EXTREMITY VENOUS DUPLEX ULTRASOUND
TECHNIQUE: Gray-scale sonography with graded compression, as well as color
Doppler and duplex ultrasound, were performed to evaluate the deep
venous system from the level of the common femoral vein through the
popliteal and proximal calf veins. Spectral Doppler was utilized to
evaluate flow at rest and with distal augmentation maneuvers.

[Series 1: us extrem low venous bilat · 0.08mm/px · 14 of 72 slices shown]
[im 1/72]
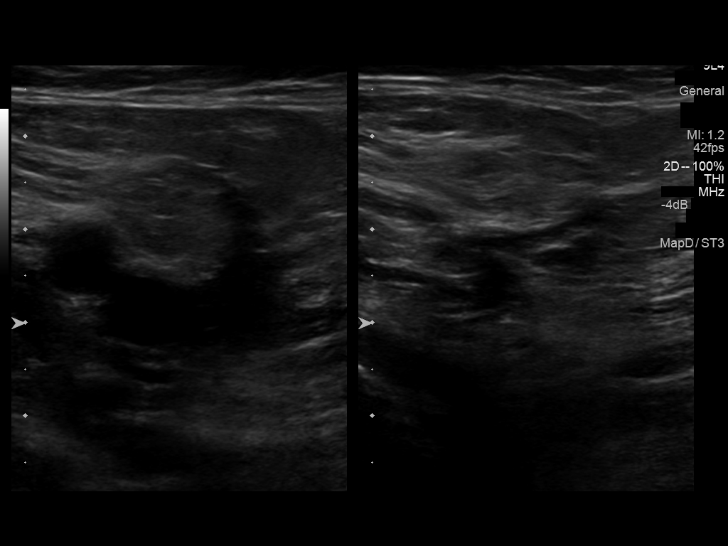
[im 7/72]
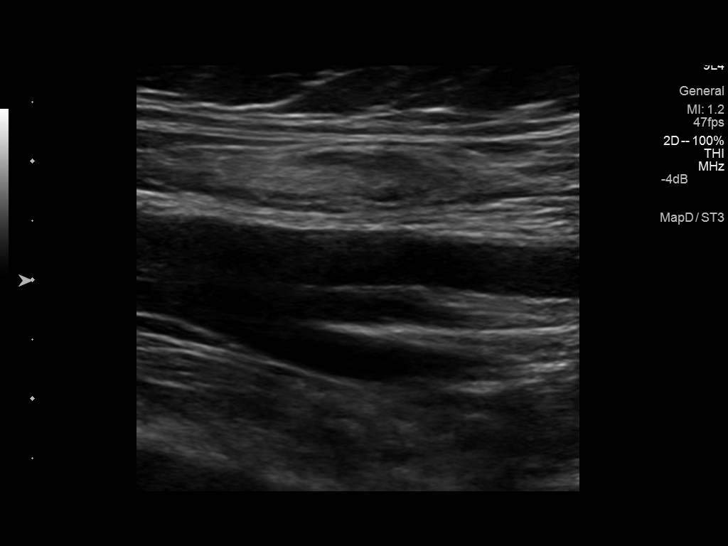
[im 13/72]
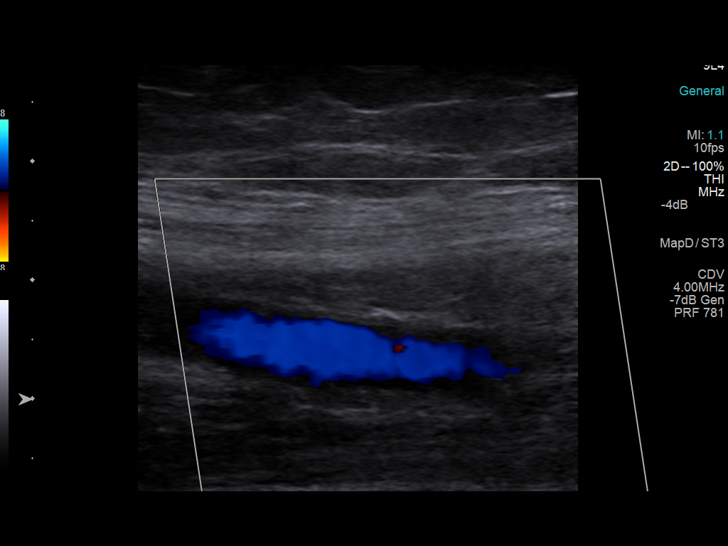
[im 19/72]
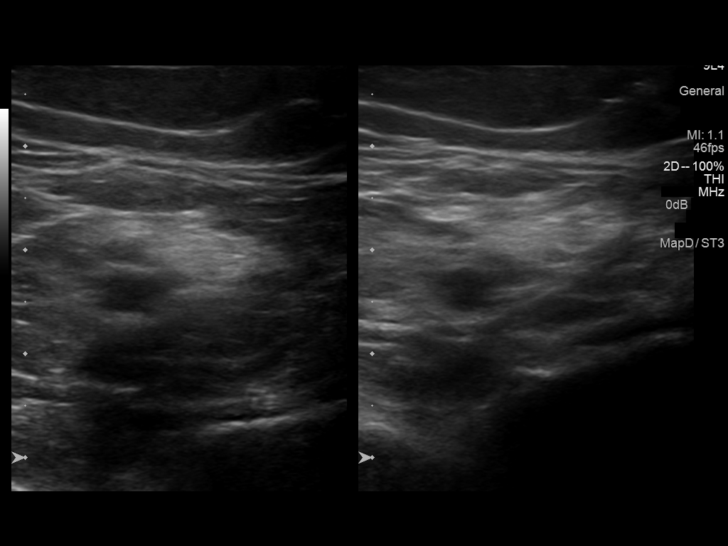
[im 22/72]
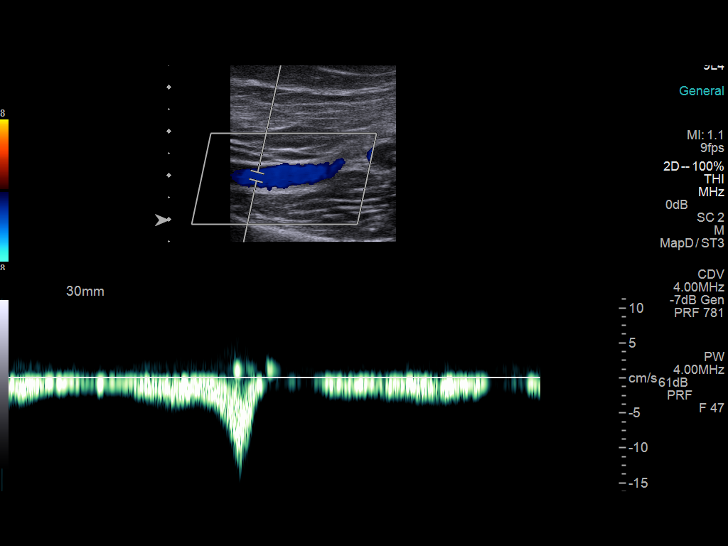
[im 28/72]
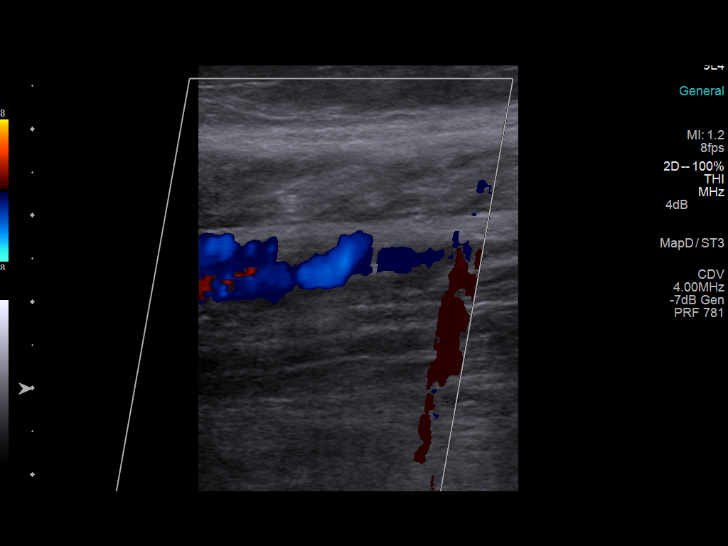
[im 34/72]
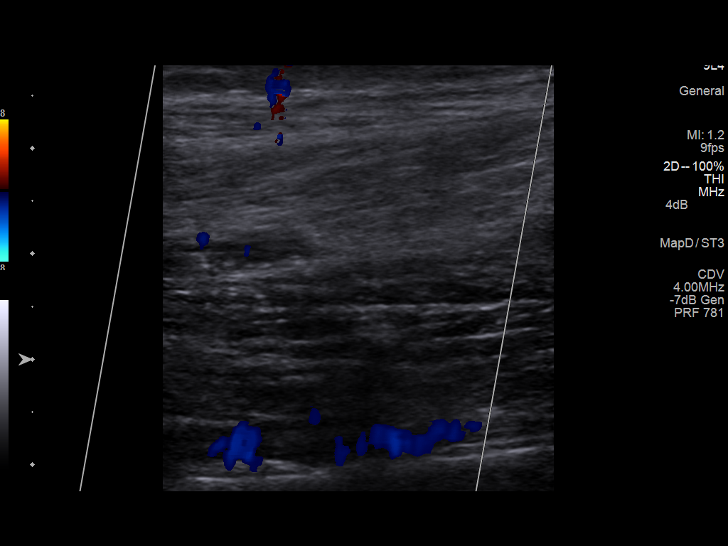
[im 38/72]
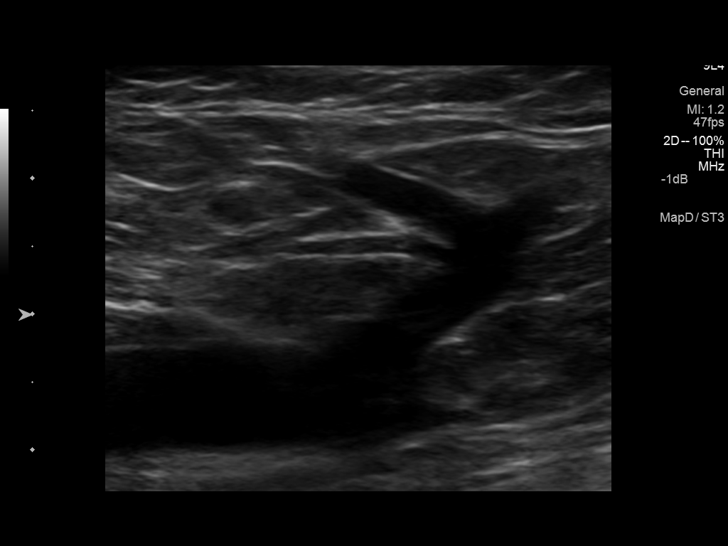
[im 44/72]
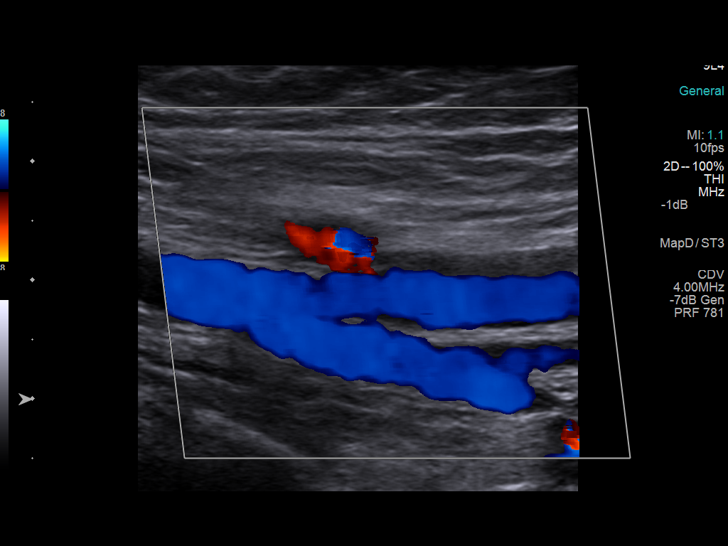
[im 50/72]
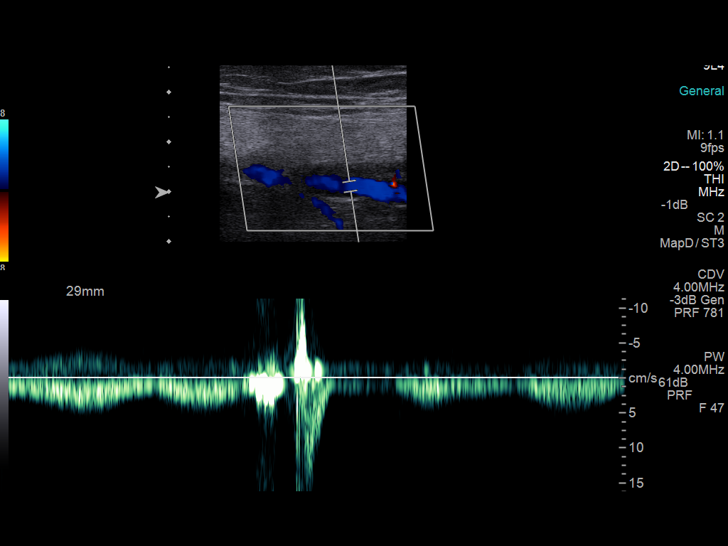
[im 56/72]
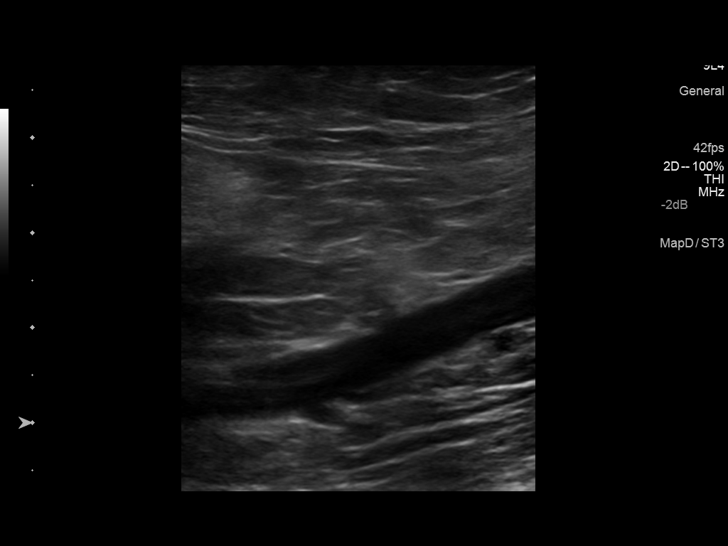
[im 59/72]
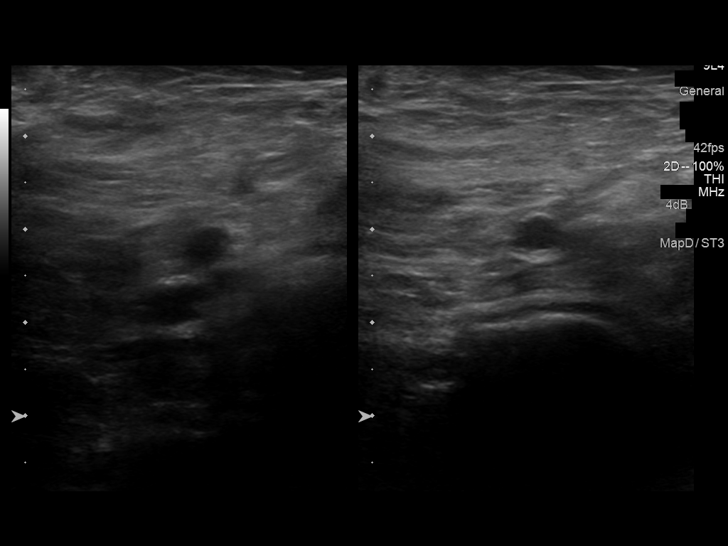
[im 65/72]
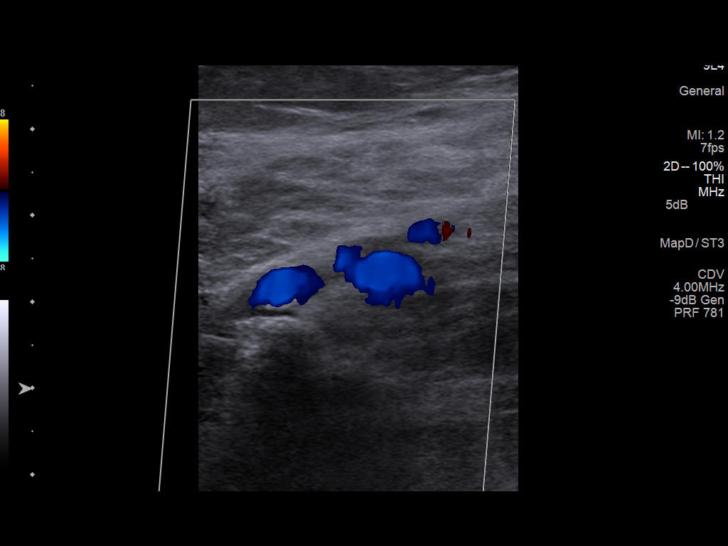
[im 72/72]
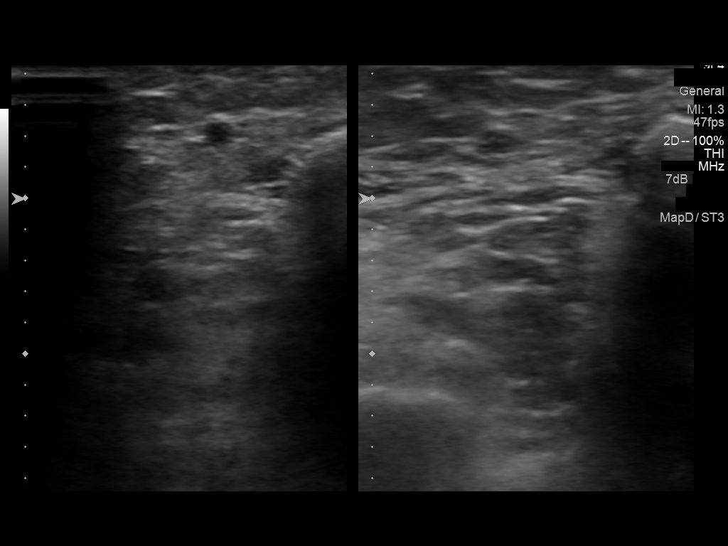

[14 of 24 positions shown; findings below may reference images not displayed]

FINDINGS: Real-time and Doppler interrogation of both lower extremity venous
systems was performed. Flow in the venous structures of both lower
extremities is spontaneous and phasic in all segments. There is
normal compression and augmentation in the venous structures of both
lower extremities. Venous Doppler signal is normal in all regions
bilaterally. There is no thrombus in the deep or visualized
superficial venous structures of either lower extremity. There is no
deep venous incompetence in either lower extremity.
IMPRESSION: No evidence of deep venous thrombosis in either lower extremity.

## 2013-06-07 IMAGING — CR DG CHEST 1V PORT
1 series · 1 of 1 positions shown · non-contrast
Comparison: [DATE] and [DATE]

CLINICAL DATA: Shortness of Breath

EXAM:
PORTABLE CHEST - 1 VIEW

[portable]
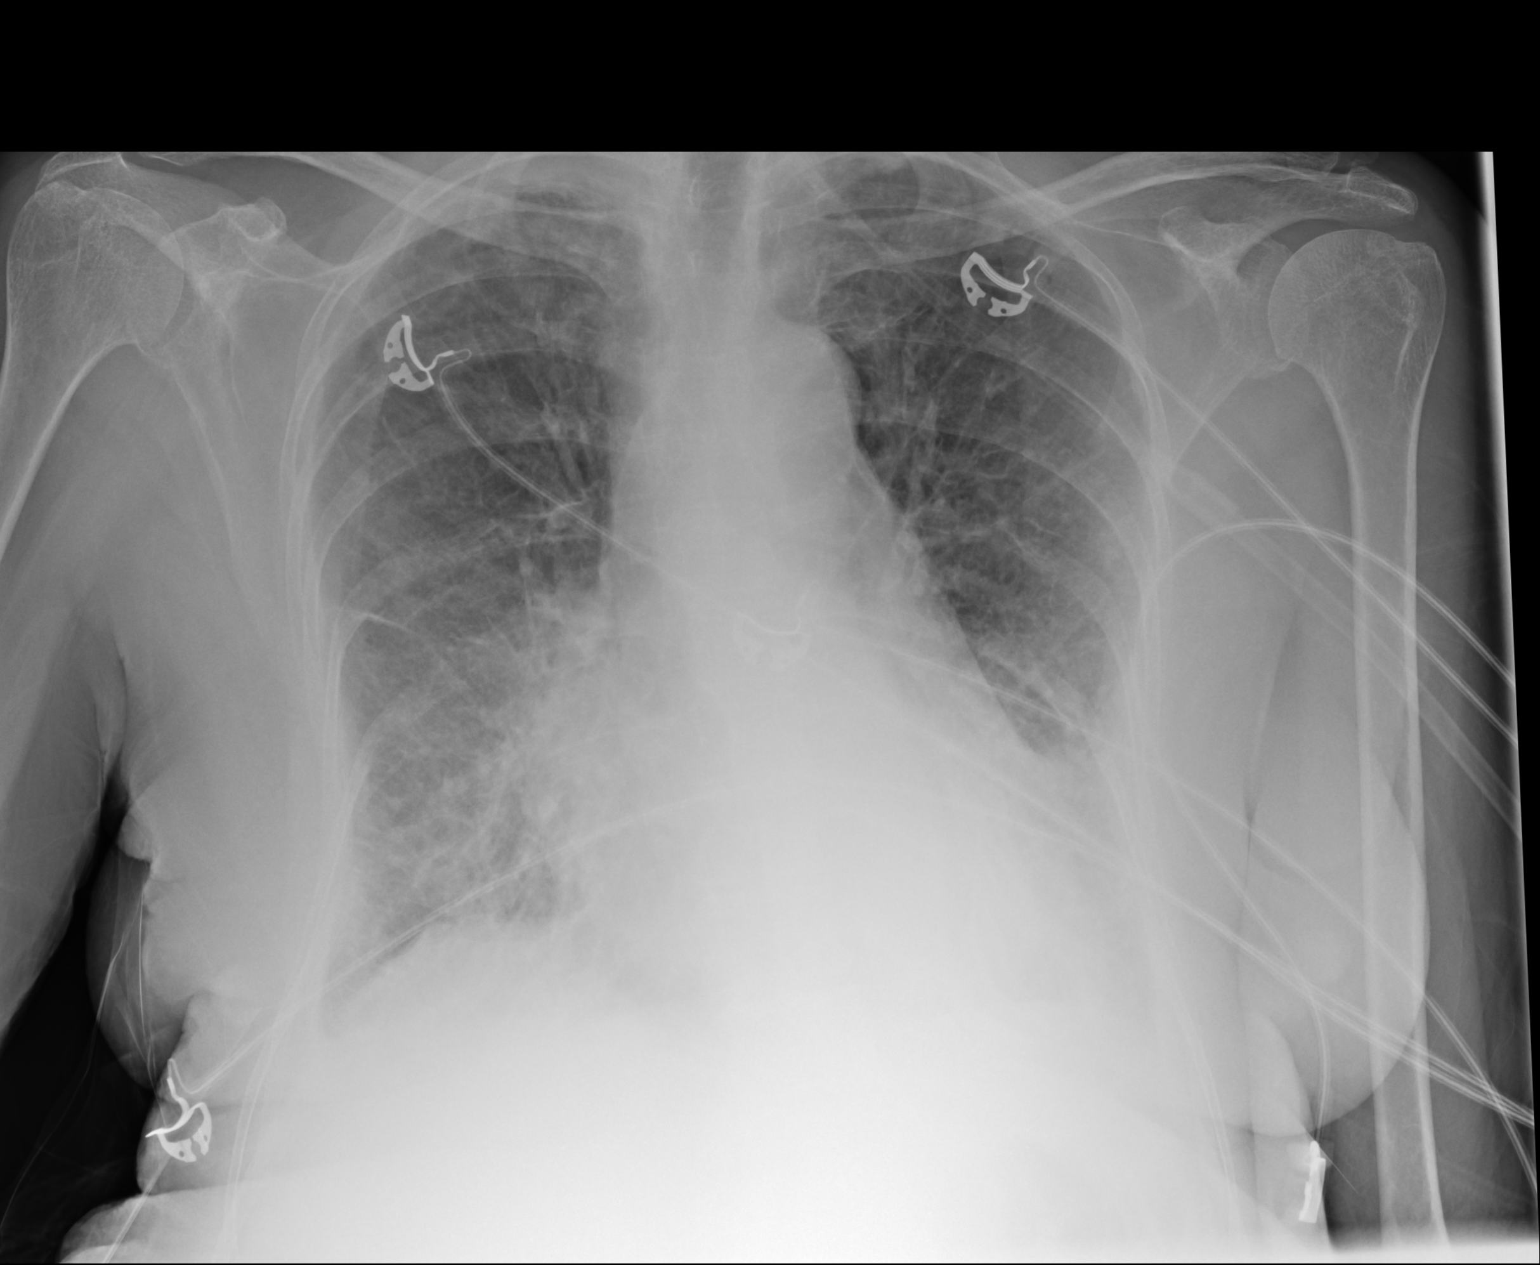

[1 of 1 positions shown; findings below may reference images not displayed]

FINDINGS: There is cardiomegaly with interstitial edema in the lungs
bilaterally. There is a minimal right effusion. There is no airspace
consolidation. No adenopathy. No bone lesions.
IMPRESSION: Evidence of a degree of congestive heart failure.  No consolidation.

## 2013-06-07 MED ORDER — SODIUM CHLORIDE 0.9 % IV SOLN: 250.0000 mL | INTRAVENOUS | Status: DC | PRN

## 2013-06-07 MED ORDER — POTASSIUM CHLORIDE CRYS ER 10 MEQ PO TBCR
10.0000 meq | EXTENDED_RELEASE_TABLET | Freq: Every day | ORAL | Status: DC
  Administered 2013-06-07 – 2013-06-08 (×2): 10 meq via ORAL
  Filled 2013-06-07 (×2): qty 1

## 2013-06-07 MED ORDER — SODIUM CHLORIDE 0.9 % IJ SOLN
3.0000 mL | Freq: Two times a day (BID) | INTRAMUSCULAR | Status: DC
  Administered 2013-06-07 – 2013-06-08 (×4): 3 mL via INTRAVENOUS

## 2013-06-07 MED ORDER — SIMVASTATIN 10 MG PO TABS
10.0000 mg | ORAL_TABLET | Freq: Every day | ORAL | Status: DC
  Administered 2013-06-07 – 2013-06-08 (×2): 10 mg via ORAL
  Filled 2013-06-07 (×2): qty 1

## 2013-06-07 MED ORDER — FUROSEMIDE 10 MG/ML IJ SOLN
40.0000 mg | Freq: Once | INTRAMUSCULAR | Status: AC
  Administered 2013-06-07: 40 mg via INTRAVENOUS
  Filled 2013-06-07: qty 4

## 2013-06-07 MED ORDER — FUROSEMIDE 10 MG/ML IJ SOLN
20.0000 mg | Freq: Once | INTRAMUSCULAR | Status: AC
  Administered 2013-06-07: 20 mg via INTRAVENOUS
  Filled 2013-06-07: qty 2

## 2013-06-07 MED ORDER — POTASSIUM CHLORIDE CRYS ER 20 MEQ PO TBCR
20.0000 meq | EXTENDED_RELEASE_TABLET | Freq: Every day | ORAL | Status: DC
  Administered 2013-06-07 – 2013-06-09 (×3): 20 meq via ORAL
  Filled 2013-06-07 (×4): qty 1

## 2013-06-07 MED ORDER — INSULIN ASPART 100 UNIT/ML ~~LOC~~ SOLN
0.0000 [IU] | Freq: Three times a day (TID) | SUBCUTANEOUS | Status: DC
  Administered 2013-06-07: 2 [IU] via SUBCUTANEOUS
  Administered 2013-06-08: 1 [IU] via SUBCUTANEOUS

## 2013-06-07 MED ORDER — ASPIRIN 81 MG PO CHEW
324.0000 mg | CHEWABLE_TABLET | Freq: Every day | ORAL | Status: DC
  Administered 2013-06-07 – 2013-06-08 (×2): 324 mg via ORAL
  Filled 2013-06-07 (×2): qty 4

## 2013-06-07 MED ORDER — POTASSIUM CHLORIDE CRYS ER 20 MEQ PO TBCR: 10.0000 meq | EXTENDED_RELEASE_TABLET | Freq: Two times a day (BID) | ORAL | Status: DC

## 2013-06-07 MED ORDER — SODIUM CHLORIDE 0.9 % IJ SOLN: 3.0000 mL | INTRAMUSCULAR | Status: DC | PRN

## 2013-06-07 MED ORDER — INSULIN ASPART 100 UNIT/ML ~~LOC~~ SOLN: 0.0000 [IU] | Freq: Every day | SUBCUTANEOUS | Status: DC

## 2013-06-07 MED ORDER — ENOXAPARIN SODIUM 40 MG/0.4ML ~~LOC~~ SOLN
40.0000 mg | SUBCUTANEOUS | Status: DC
  Administered 2013-06-07 – 2013-06-09 (×3): 40 mg via SUBCUTANEOUS
  Filled 2013-06-07 (×3): qty 0.4

## 2013-06-07 MED ORDER — TRAZODONE HCL 50 MG PO TABS
50.0000 mg | ORAL_TABLET | Freq: Every evening | ORAL | Status: DC | PRN
  Administered 2013-06-07 – 2013-06-08 (×2): 50 mg via ORAL
  Filled 2013-06-07 (×2): qty 1

## 2013-06-07 MED ORDER — NITROGLYCERIN 0.4 MG SL SUBL: 0.4000 mg | SUBLINGUAL_TABLET | SUBLINGUAL | Status: DC | PRN

## 2013-06-07 MED ORDER — LISINOPRIL 10 MG PO TABS
10.0000 mg | ORAL_TABLET | Freq: Every morning | ORAL | Status: DC
  Administered 2013-06-07 – 2013-06-09 (×3): 10 mg via ORAL
  Filled 2013-06-07 (×3): qty 1

## 2013-06-07 MED ORDER — PANTOPRAZOLE SODIUM 40 MG PO TBEC
40.0000 mg | DELAYED_RELEASE_TABLET | Freq: Two times a day (BID) | ORAL | Status: DC
  Administered 2013-06-07 – 2013-06-09 (×5): 40 mg via ORAL
  Filled 2013-06-07 (×5): qty 1

## 2013-06-07 MED ORDER — LOPERAMIDE HCL 2 MG PO CAPS
4.0000 mg | ORAL_CAPSULE | Freq: Every day | ORAL | Status: DC | PRN
  Administered 2013-06-08: 4 mg via ORAL
  Filled 2013-06-07: qty 2

## 2013-06-07 MED ORDER — TRAMADOL HCL 50 MG PO TABS
50.0000 mg | ORAL_TABLET | Freq: Four times a day (QID) | ORAL | Status: DC | PRN
  Administered 2013-06-07: 50 mg via ORAL
  Filled 2013-06-07: qty 1

## 2013-06-07 MED ORDER — ATENOLOL 25 MG PO TABS
50.0000 mg | ORAL_TABLET | Freq: Two times a day (BID) | ORAL | Status: DC
  Administered 2013-06-07 – 2013-06-09 (×5): 50 mg via ORAL
  Filled 2013-06-07 (×5): qty 2

## 2013-06-07 NOTE — ED Notes (Addendum)
Per EMS, called out in reference to SOB. Per EMS, pt reports sudden onset SOB this am. EMS admin three albuterol and atrovent treatment en route. Pt alert and oriented. Airway patent. Pt alert and oriented. Pt denies any pain. Pt pale with moderate accessory muscle use.Pt denies any productive cough.EMS also admin 125 solu-medrol en route.

## 2013-06-07 NOTE — ED Provider Notes (Signed)
CSN: [id number]     Arrival date & time 06/07/13  0825 History  This chart was scribed for Maudry Diego, MD,  by Stacy Gardner, ED Scribe. The patient was seen in room APA02/APA02 and the patient's care was started at 8:39 AM.   First MD Initiated Contact with Patient 06/07/13 [phone]     Chief Complaint  Patient presents with  . Shortness of Breath     (Consider location/radiation/quality/duration/timing/severity/associated sxs/prior Treatment) Patient is a 80 y.o. female presenting with shortness of breath. The history is provided by the patient and medical records. No language interpreter was used.  Shortness of Breath Severity:  Moderate Onset quality:  Sudden Duration:  2 hours Timing:  Constant Progression:  Unchanged Chronicity:  New Relieved by:  Nothing Ineffective treatments:  None tried Associated symptoms: no abdominal pain, no chest pain, no cough and no fever    HPI Comments: Renee Blake is a 80 y.o. female who presents to the Emergency Department via EMS complaining of constant moderate SOB for the past two hours. She is AO x 4.  Pt SOB occurred suddenly and remains uncharged since onset. En route to the ED, EMS administered three albuterol treatments,  125 solu-medrol  and Atrovent.  Denies fever and cough. Denies the use of an inhaler while at home. Denies prior SOB or any breathing complications.  Denies any pain. Pt was taking off her low dosage fluid pill two days ago. Nothing seems to make her symptoms better. Upon arrival the ED pt's oxygen saturation was 91%.  Past Medical History  Diagnosis Date  . Hypertension   . Hyperlipemia   . Gastric ulcer   . Skin cancer   . Chronic diarrhea   . Vertigo   . Sliding hiatal hernia   . Schatzki's ring   . Duodenal stricture   . GI bleed   . Microscopic colitis   . Acute blood loss anemia   . Dilation of biliary tract     biliary duct per 02/26/13 CT  . Pancreatic duct dilated 02/26/13  . Nephrolithiasis    . Esophageal stricture   . Diabetes    Past Surgical History  Procedure Laterality Date  . Nose surgery      for skin cancer  . Esophagogastroduodenoscopy N/A 02/27/2013    Procedure: ESOPHAGOGASTRODUODENOSCOPY (EGD);  Surgeon: Ladene Artist, MD;  Location: [facility] ENDOSCOPY;  Service: Endoscopy;  Laterality: N/A;  . Hot hemostasis N/A 02/27/2013    Procedure: HOT HEMOSTASIS (ARGON PLASMA COAGULATION/BICAP);  Surgeon: Ladene Artist, MD;  Location: [facility] ENDOSCOPY;  Service: Endoscopy;  Laterality: N/A;  . Esophagogastroduodenoscopy N/A 03/28/2013    Procedure: ESOPHAGOGASTRODUODENOSCOPY (EGD);  Surgeon: Rogene Houston, MD;  Location: [facility] ENDO SUITE;  Service: Endoscopy;  Laterality: N/A;   Family History  Problem Relation Age of Onset  . Heart attack Father    History  Substance Use Topics  . Smoking status: Never Smoker   . Smokeless tobacco: Not on file  . Alcohol Use: No   OB History   Grav Para Term Preterm Abortions TAB SAB Ect Mult Living                 Review of Systems  Constitutional: Negative for fever.  Respiratory: Positive for shortness of breath. Negative for cough.   Cardiovascular: Negative for chest pain.  Gastrointestinal: Negative for abdominal pain.  Musculoskeletal: Negative for arthralgias, back pain and myalgias.  All other systems reviewed and are negative.  Allergies  Codeine and Sulfa antibiotics  Home Medications   Current Outpatient Rx  Name  Route  Sig  Dispense  Refill  . atenolol (TENORMIN) 50 MG tablet   Oral   Take 50 mg by mouth 2 (two) times daily.         Marland Kitchen lisinopril (PRINIVIL,ZESTRIL) 10 MG tablet   Oral   Take 10 mg by mouth every morning.         . loperamide (IMODIUM A-D) 2 MG tablet   Oral   Take 4 mg by mouth every morning.          . lovastatin (MEVACOR) 20 MG tablet   Oral   Take 20 mg by mouth at bedtime.         . meclizine (ANTIVERT) 25 MG tablet   Oral   Take 25 mg by mouth 2 (two) times  daily as needed for dizziness (VERTIGO).         Marland Kitchen pantoprazole (PROTONIX) 40 MG tablet   Oral   Take 40 mg by mouth 2 (two) times daily.         . potassium chloride (K-DUR,KLOR-CON) 10 MEQ tablet   Oral   Take 10-20 mEq by mouth 2 (two) times daily. Two tablets take in the morning and one tablet taken in the evening         . Tetrahydrozoline HCl (EYE DROPS OP)   Ophthalmic   Apply 2 drops to eye daily as needed (dry eyes).         . traMADol (ULTRAM) 50 MG tablet   Oral   Take 50 mg by mouth 4 (four) times daily as needed for moderate pain or severe pain.           BP 151/96  Pulse 80  Resp 28  Ht 5\' 7"  (1.702 m)  Wt 117 lb (53.071 kg)  BMI 18.32 kg/m2  SpO2 91% Physical Exam  Constitutional: She is oriented to person, place, and time. She appears well-developed.  HENT:  Head: Normocephalic.  Eyes: Conjunctivae and EOM are normal. No scleral icterus.  Neck: Neck supple. No thyromegaly present.  Cardiovascular: Normal rate and regular rhythm.  Exam reveals no gallop and no friction rub.   No murmur heard. Pulmonary/Chest: No stridor. She has no wheezes. She has no rales. She exhibits no tenderness.  Crackles at the base of bilateral lungs, posterior aspect.   Abdominal: She exhibits no distension. There is no tenderness. There is no rebound.  Musculoskeletal: Normal range of motion. She exhibits no edema.  Lymphadenopathy:    She has no cervical adenopathy.  Neurological: She is oriented to person, place, and time. She exhibits normal muscle tone. Coordination normal.  Skin: No rash noted. No erythema.  Psychiatric: She has a normal mood and affect. Her behavior is normal.    ED Course  Procedures (including critical care time) DIAGNOSTIC STUDIES: Oxygen Saturation is 91% on [place], low by my interpretation.    COORDINATION OF CARE:  8:43 AM Discussed course of care with pt which includes chest x-ray and laboratory tests. Pt understands and agrees. 10:14 AM  Discussed course of care with pt concerns of results and the possibility of admittance to the hospital. Pt understands and agrees.     Labs Review Labs Reviewed  CBC WITH DIFFERENTIAL  TROPONIN I  BASIC METABOLIC PANEL   Imaging Review No results found.   EKG Interpretation   Date/Time:  Monday June 07 2013 08:33:48 EDT Ventricular  Rate:  79 PR Interval:  146 QRS Duration: 92 QT Interval:  384 QTC Calculation: 440 R Axis:   -9 Text Interpretation:  Normal sinus rhythm Moderate voltage criteria for  LVH, may be normal variant Borderline ECG When compared with ECG of  26-Mar-2013 17:22, Non-specific change in ST segment in Anterior leads  Confirmed by Alexica Schlossberg  MD, Elaine Middleton ([id number]) on 06/07/2013 10:39:58 AM      MDM   Final diagnoses:  None    chf admit  The chart was scribed for me under my direct supervision.  I personally performed the history, physical, and medical decision making and all procedures in the evaluation of this patient.Maudry Diego, MD 06/07/13 1040

## 2013-06-07 NOTE — H&P (Signed)
History and Physical  Renee Blake IRS:[id number] DOB: 03-01-1929 DOA: 06/07/2013  Referring physician: Dr. Roderic Palau in ED PCP: Celedonio Savage, MD   Chief Complaint: Short of breath  HPI:  80 year old woman with history of diastolic dysfunction by echocardiogram but no formal history of CHF presented to the emergency department today with sudden onset of shortness of breath. Initial evaluation suggested heart failure the patient was referred for admission after being treated with Lasix with excellent diuresis.  History obtained from patient as well as 2 daughters at bedside. There is no history of heart failure but they are aware of, no history of heart disease. Patient was last hospitalized in December for GI bleed. During that hospitalization an echocardiogram was done which revealed LVEF 65-70% with normal wall motion, grade 1 diastolic dysfunction. The patient was noted to be euvolemic and did not require diuretics.  Several weeks ago or longer the patient was started on amlodipine for hypertension. She then developed bilateral lower extremity edema he was placed on Lasix by her primary care physician. No significant shortness of breath. Approximately 1 week ago primary care physician recommended discontinuing amlodipine and Lasix. Lower extremity edema improved. Per family the patient was seen at [facility] emergency department 3/3, diagnosed with "congestion", placed on antibiotic and discharged. Records are not currently available.   Yesterday the patient felt fine. Today the patient was lying down in bed when she suddenly became very short of breath. She felt hot but had no diaphoresis, chest pain, no pain in left arm neck or jaw. No nausea or vomiting. No shortness of breath persisted until she came to the emergency department and was treated. Currently she feels better, has no chest pain or other complaints.  In the emergency department afebrile, vital signs stable. Minimal hypoxia.basic  metabolic panel, hepatic function panel, troponin unremarkable. BNP 2396. WBC 12.0. Chest x-ray suggested acute CHF, left-sided pleural effusion. EKG showed sinus rhythm with LVH, T-wave inversion in anterior leads, consider ischemia, compared to previous study 03/26/2013, T-wave inversion is noted.  Review of Systems:  Negative for fever, visual changes, sore throat, rash, new muscle aches, chest pain, dysuria, bleeding, n/v/abdominal pain.  Past Medical History  Diagnosis Date  . Hypertension   . Hyperlipemia   . Gastric ulcer   . Skin cancer   . Chronic diarrhea   . Vertigo   . Sliding hiatal hernia   . Schatzki's ring   . Duodenal stricture   . GI bleed   . Microscopic colitis   . Acute blood loss anemia   . Dilation of biliary tract     biliary duct per 02/26/13 CT  . Pancreatic duct dilated 02/26/13  . Nephrolithiasis   . Esophageal stricture   . Diabetes     Past Surgical History  Procedure Laterality Date  . Nose surgery      for skin cancer  . Esophagogastroduodenoscopy N/A 02/27/2013    Procedure: ESOPHAGOGASTRODUODENOSCOPY (EGD);  Surgeon: Ladene Artist, MD;  Location: [facility] ENDOSCOPY;  Service: Endoscopy;  Laterality: N/A;  . Hot hemostasis N/A 02/27/2013    Procedure: HOT HEMOSTASIS (ARGON PLASMA COAGULATION/BICAP);  Surgeon: Ladene Artist, MD;  Location: [facility] ENDOSCOPY;  Service: Endoscopy;  Laterality: N/A;  . Esophagogastroduodenoscopy N/A 03/28/2013    Procedure: ESOPHAGOGASTRODUODENOSCOPY (EGD);  Surgeon: Rogene Houston, MD;  Location: [facility] ENDO SUITE;  Service: Endoscopy;  Laterality: N/A;    Social History:  reports that she has never smoked. She does not have any smokeless tobacco history on file.  She reports that she does not drink alcohol or use illicit drugs.  Allergies  Allergen Reactions  . Codeine Other (See Comments)    Reaction unknown  . Sulfa Antibiotics Other (See Comments)    Reactions unknown    Family History  Problem Relation Age of  Onset  . Heart attack Father      Prior to Admission medications   Medication Sig Start Date End Date Taking? Authorizing Provider  atenolol (TENORMIN) 50 MG tablet Take 50 mg by mouth 2 (two) times daily.   Yes Historical Provider, MD  lisinopril (PRINIVIL,ZESTRIL) 10 MG tablet Take 10 mg by mouth every morning.   Yes Historical Provider, MD  loperamide (IMODIUM A-D) 2 MG tablet Take 4 mg by mouth daily as needed for diarrhea or loose stools.    Yes Historical Provider, MD  lovastatin (MEVACOR) 20 MG tablet Take 20 mg by mouth at bedtime.   Yes Historical Provider, MD  meclizine (ANTIVERT) 25 MG tablet Take 25 mg by mouth 2 (two) times daily as needed for dizziness (VERTIGO).   Yes Historical Provider, MD  pantoprazole (PROTONIX) 40 MG tablet Take 40 mg by mouth 2 (two) times daily.   Yes Historical Provider, MD  potassium chloride (K-DUR,KLOR-CON) 10 MEQ tablet Take 10-20 mEq by mouth 2 (two) times daily. Two tablets take in the morning and one tablet taken in the evening 03/10/13  Yes Historical Provider, MD  Tetrahydrozoline HCl (EYE DROPS OP) Apply 2 drops to eye daily as needed (dry eyes).   Yes Historical Provider, MD  traMADol (ULTRAM) 50 MG tablet Take 50 mg by mouth 4 (four) times daily as needed for moderate pain or severe pain.    Yes Historical Provider, MD   Physical Exam: Filed Vitals:   06/07/13 0900 06/07/13 1000 06/07/13 1001 06/07/13 1059  BP: 151/60 133/57 133/57 148/66  Pulse: 78 74 70 70  Temp:    97.8 F (36.6 C)  TempSrc:    Oral  Resp: 23 19 18 18   Height:      Weight:      SpO2: 94% 98% 97% 100%    General: Examined in the emergency department. Appears calm and comfortable Eyes: PERRL, normal lids, irises  ENT: grossly normal hearing, lips & tongue. Chronic deformity of the nose from previous surgery. Neck: no LAD, masses or thyromegaly Cardiovascular: RRR, no m/r/g. No LE edema. Respiratory: CTA bilaterally, no w/r/r. Normal respiratory effort. Abdomen:  soft, ntnd Skin: no rash or induration seen  Musculoskeletal: grossly normal tone BUE/BLE Psychiatric: grossly normal mood and affect, speech fluent and appropriate Neurologic: grossly non-focal.  Wt Readings from Last 3 Encounters:  06/07/13 53.071 kg (117 lb)  05/11/13 53.252 kg (117 lb 6.4 oz)  03/31/13 55.792 kg (123 lb)    Labs on Admission:  Basic Metabolic Panel:  Recent Labs Lab 06/07/13 0849  NA 142  K 3.9  CL 105  CO2 23  GLUCOSE 169*  BUN 15  CREATININE 0.75  CALCIUM 9.6    Liver Function Tests:  Recent Labs Lab 06/07/13 0849  AST 22  ALT 24  ALKPHOS 96  BILITOT 0.3  PROT 7.6  ALBUMIN 3.8   CBC:  Recent Labs Lab 06/07/13 0849  WBC 12.0*  NEUTROABS 9.2*  HGB 12.7  HCT 38.0  MCV 90.3  PLT PLATELET CLUMPS NOTED ON SMEAR, COUNT APPEARS ADEQUATE    Cardiac Enzymes:  Recent Labs Lab 06/07/13 0849  TROPONINI <0.30    Recent Labs  06/07/13  Winter 2396.0*    Radiological Exams on Admission: Dg Chest Portable 1 View  06/07/2013   CLINICAL DATA:  Shortness of Breath  EXAM: PORTABLE CHEST - 1 VIEW  COMPARISON:  June 01, 2013 and February 26, 2013  FINDINGS: There is cardiomegaly with interstitial edema in the lungs bilaterally. There is a minimal right effusion. There is no airspace consolidation. No adenopathy. No bone lesions.  IMPRESSION: Evidence of a degree of congestive heart failure.  No consolidation.   Electronically Signed   By: Lowella Grip M.D.   On: 06/07/2013 09:04    EKG: Independently reviewed. As above.    Active Problems:   CHF (congestive heart failure)   Assessment/Plan 1. Acute shortness of breath. Suspect acute diastolic heart failure with known diastolic dysfunction, however given lack of previous symptoms, sudden onset, lack of lower extremity edema and questionable EKG changes, there is some concern for ACS. She has no h/o VTE or recent travel, although she has had hospitalizations over the last 4-5  months. Her legs appear unremarkable. Clinical scenario does not suggest VTE and Wells score = 0. Check BLE dopplers, but unless positive, would not pursue further evaluation. Her white blood cell count slightly elevated, but she does not appear to have pneumonia clinically. Risk factors include hypertension, hyperlipidemia, diabetes, age. 2. Diabetes mellitus type 2. 3. History of melena, acute blood loss anemia 03/2013. EGD at that time revealed 90% healed duodenal ulcer. No further bleeding. 4. History of microscopic colitis with chronic diarrhea.   Currently she is calm and comfortable. No chest pain. Plan admission to telemetry, serial troponin, EKG in the morning, diuretics, aspirin, PRN NTG, oxygen. If she develops recurrent shortness of breath or positive troponin, start anticoagulation.  Sliding-scale insulin.  Bilateral lower extremity Dopplers.  Discussed above with daughters at bedside.  Code Status: full code  DVT prophylaxis: Lovenox Family Communication: as above Disposition Plan/Anticipated LOS: admit, 2-3 days  Time spent: 65 minutes  Murray Hodgkins, MD  [facility] Pager [phone] 06/07/2013, 11:36 AM

## 2013-06-08 DIAGNOSIS — E119 Type 2 diabetes mellitus without complications: Secondary | ICD-10-CM

## 2013-06-08 LAB — BASIC METABOLIC PANEL
BUN: 28 mg/dL — ABNORMAL HIGH (ref 6–23)
CO2: 26 mEq/L (ref 19–32)
Calcium: 9.3 mg/dL (ref 8.4–10.5)
Chloride: 101 mEq/L (ref 96–112)
Creatinine, Ser: 1.02 mg/dL (ref 0.50–1.10)
GFR calc Af Amer: 58 mL/min — ABNORMAL LOW (ref >90)
GFR calc non Af Amer: 50 mL/min — ABNORMAL LOW (ref >90)
Glucose, Bld: 151 mg/dL — ABNORMAL HIGH (ref 70–99)
Potassium: 4.1 mEq/L (ref 3.7–5.3)
Sodium: 140 mEq/L (ref 137–147)

## 2013-06-08 LAB — CBC
HCT: 32.6 % — ABNORMAL LOW (ref 36.0–46.0)
Hemoglobin: 11 g/dL — ABNORMAL LOW (ref 12.0–15.0)
MCH: 29.7 pg (ref 26.0–34.0)
MCHC: 33.7 g/dL (ref 30.0–36.0)
MCV: 88.1 fL (ref 78.0–100.0)
Platelets: 253 10*3/uL (ref 150–400)
RBC: 3.7 MIL/uL — ABNORMAL LOW (ref 3.87–5.11)
RDW: 13.9 % (ref 11.5–15.5)
WBC: 11.5 10*3/uL — ABNORMAL HIGH (ref 4.0–10.5)

## 2013-06-08 LAB — GLUCOSE, CAPILLARY
Glucose-Capillary: 138 mg/dL — ABNORMAL HIGH (ref 70–99)
Glucose-Capillary: 120 mg/dL — ABNORMAL HIGH (ref 70–99)
Glucose-Capillary: 92 mg/dL (ref 70–99)
Glucose-Capillary: 100 mg/dL — ABNORMAL HIGH (ref 70–99)

## 2013-06-08 LAB — TROPONIN I: Troponin I: <0.3 ng/mL (ref <0.30)

## 2013-06-08 MED ORDER — REGADENOSON 0.4 MG/5ML IV SOLN
0.4000 mg | Freq: Once | INTRAVENOUS | Status: AC
  Filled 2013-06-08: qty 5

## 2013-06-08 MED ORDER — FUROSEMIDE 20 MG PO TABS
20.0000 mg | ORAL_TABLET | Freq: Every day | ORAL | Status: DC
  Administered 2013-06-08: 20 mg via ORAL
  Filled 2013-06-08: qty 1

## 2013-06-08 NOTE — Progress Notes (Signed)
PROGRESS NOTE  Renee Blake JAS:[id number] DOB: [birth year] DOA: 06/07/2013 PCP: Celedonio Savage, MD  Summary: 80 year old woman with history of diastolic dysfunction by echocardiogram but no formal history of CHF presented to the emergency department today with sudden onset of shortness of breath. Initial evaluation suggested heart failure the patient was referred for admission after being treated with Lasix with excellent diuresis. She is rapidly improved with diuresis. Plan cardiology consultation, likely discharge in the morning.  Assessment/Plan: 1. Acute diastolic congestive heart failure. Troponins negative, repeat EKG unremarkable. No signs to suggest ACS. No recurrent symptoms. Telemetry sinus rhythm. No history to suggest VTE. 2. Diabetes mellitus type 2. Stable. 3. History of melena, acute blood loss anemia 03/2013. EGD at that time revealed 90% healed duodenal ulcer. No further bleeding.  4. History of microscopic colitis with chronic diarrhea.   Overall much improved. No recurrent symptoms. Continue diuresis. Family has requested cardiology consultation. They are interested in establishing care here in [place].  Likely home 3/11.  Discussed with daughter at bedside.  Code Status: full code DVT prophylaxis: Lovenox Family Communication:  Disposition Plan:   Murray Hodgkins, MD  [facility]  Pager [phone] If 7PM-7AM, please contact night-coverage at [email or web address], password [facility] 06/08/2013, 3:42 PM  LOS: 1 day   Consultants:  Cardiology by family request  Procedures:    Antibiotics:    HPI/Subjective: Feels better today. No shortness of breath. No chest pain. No recurrent problems.  Objective: Filed Vitals:   06/08/13 0501 06/08/13 0546 06/08/13 0847 06/08/13 1425  BP:  110/42 120/54 137/65  Pulse:  54  57  Temp:  97.8 F (36.6 C)  97.5 F (36.4 C)  TempSrc:  Oral  Oral  Resp:  20  20  Height:      Weight: 52.6 kg (115 lb 15.4 oz)     SpO2:   95%  94%    Intake/Output Summary (Last 24 hours) at 06/08/13 1542 Last data filed at 06/08/13 1425  Gross per 24 hour  Intake    600 ml  Output    550 ml  Net     50 ml     Filed Weights   06/07/13 0829 06/07/13 1211 06/08/13 0501  Weight: 53.071 kg (117 lb) 53 kg (116 lb 13.5 oz) 52.6 kg (115 lb 15.4 oz)    Exam:   Afebrile, vital signs are stable. No hypoxia.  General: Appears calm and comfortable. Speech fluent and clear.  Cardiovascular regular rate and rhythm. No murmur, rub or gallop. No lower extremity edema.  Respiratory: CTA bilaterally, no w/r/r. Normal respiratory effort.  Psychiatric grossly normal mood and affect. Speech fluent and appropriate.  Data Reviewed:  Weight down 1-2 pounds. -500 I/O  Capillary blood sugars stable  Basic metabolic panel unremarkable.  Troponins negative  CBC notable for modest leukocytosis, 11.5, improved. Hemoglobin stable.  Bilateral lower extremity venous Dopplers were negative.  EKG sinus bradycardia. No acute changes.  Scheduled Meds: . aspirin  324 mg Oral Daily  . atenolol  50 mg Oral BID  . enoxaparin (LOVENOX) injection  40 mg Subcutaneous Q24H  . insulin aspart  0-5 Units Subcutaneous QHS  . insulin aspart  0-9 Units Subcutaneous TID WC  . lisinopril  10 mg Oral q morning - 10a  . pantoprazole  40 mg Oral BID  . potassium chloride  20 mEq Oral Daily   And  . potassium chloride  10 mEq Oral QHS  . simvastatin  10 mg Oral q1800  .  sodium chloride  3 mL Intravenous Q12H   Continuous Infusions:   Active Problems:   CHF (congestive heart failure)   Acute diastolic congestive heart failure   Time spent 15 minutes

## 2013-06-08 NOTE — Consult Note (Signed)
Consulting cardiologist: Carlyle Dolly MD  Clinical Summary Renee Blake is a 80 y.o.female with history of diastolic dysfunction, HTN, HL, DM, and prior GI bleed admitted with SOB. Patient reports she was feeling well the days prior, however developed acute onset SOB while laying in bed. Denies any chest pain or palpitations. Presented with evidence of volume overload and acute on chronic diastolic heart failure.   EKG NSR, TWI anteroseptal leads new from prior EKGs. Troponin negative. Pro-BNP 2396, CXR with pulm edema. LE [place] neg for DVT.  Vitals in ER were p 80 bp 151/96  Allergies  Allergen Reactions  . Codeine Other (See Comments)    Reaction unknown  . Sulfa Antibiotics Other (See Comments)    Reactions unknown    Medications Scheduled Medications: . aspirin  324 mg Oral Daily  . atenolol  50 mg Oral BID  . enoxaparin (LOVENOX) injection  40 mg Subcutaneous Q24H  . insulin aspart  0-5 Units Subcutaneous QHS  . insulin aspart  0-9 Units Subcutaneous TID WC  . lisinopril  10 mg Oral q morning - 10a  . pantoprazole  40 mg Oral BID  . potassium chloride  20 mEq Oral Daily   And  . potassium chloride  10 mEq Oral QHS  . simvastatin  10 mg Oral q1800  . sodium chloride  3 mL Intravenous Q12H     Infusions:     PRN Medications:  sodium chloride, loperamide, nitroGLYCERIN, sodium chloride, traMADol, traZODone   Past Medical History  Diagnosis Date  . Hypertension   . Hyperlipemia   . Gastric ulcer   . Skin cancer   . Chronic diarrhea   . Vertigo   . Sliding hiatal hernia   . Schatzki's ring   . Duodenal stricture   . GI bleed   . Microscopic colitis   . Acute blood loss anemia   . Dilation of biliary tract     biliary duct per 02/26/13 CT  . Pancreatic duct dilated 02/26/13  . Nephrolithiasis   . Esophageal stricture   . Diabetes     Past Surgical History  Procedure Laterality Date  . Nose surgery      for skin cancer  . Esophagogastroduodenoscopy  N/A 02/27/2013    Procedure: ESOPHAGOGASTRODUODENOSCOPY (EGD);  Surgeon: Ladene Artist, MD;  Location: [facility] ENDOSCOPY;  Service: Endoscopy;  Laterality: N/A;  . Hot hemostasis N/A 02/27/2013    Procedure: HOT HEMOSTASIS (ARGON PLASMA COAGULATION/BICAP);  Surgeon: Ladene Artist, MD;  Location[facility] ENDOSCOPY;  Service: Endoscopy;  Laterality: N/A;  . Esophagogastroduodenoscopy N/A 03/28/2013    Procedure: ESOPHAGOGASTRODUODENOSCOPY (EGD);  Surgeon: Rogene Houston, MD;  Location: [facility] ENDO SUITE;  Service: Endoscopy;  Laterality: N/A;    Family History  Problem Relation Age of Onset  . Heart attack Father     Social History Renee Blake reports that she has never smoked. She does not have any smokeless tobacco history on file. Renee Blake reports that she does not drink alcohol.  Review of Systems CONSTITUTIONAL: No weight loss, fever, chills, weakness or fatigue.  HEENT: Eyes: No visual loss, blurred vision, double vision or yellow sclerae. No hearing loss, sneezing, congestion, runny nose or sore throat.  SKIN: No rash or itching.  CARDIOVASCULAR: per HPI RESPIRATORY: per HPI GASTROINTESTINAL: No anorexia, nausea, vomiting or diarrhea. No abdominal pain or blood.  GENITOURINARY: no polyuria, no dysuria NEUROLOGICAL: No headache, dizziness, syncope, paralysis, ataxia, numbness or tingling in the extremities. No change in bowel or  bladder control.  MUSCULOSKELETAL: No muscle, back pain, joint pain or stiffness.  HEMATOLOGIC: No anemia, bleeding or bruising.  LYMPHATICS: No enlarged nodes. No history of splenectomy.  PSYCHIATRIC: No history of depression or anxiety.      Physical Examination Blood pressure 137/65, pulse 57, temperature 97.5 F (36.4 C), temperature source Oral, resp. rate 20, height 5\' 6"  (1.676 m), weight 115 lb 15.4 oz (52.6 kg), SpO2 94.00%.  Intake/Output Summary (Last 24 hours) at 06/08/13 1459 Last data filed at 06/08/13 1425  Gross per 24 hour  Intake    600 ml   Output    550 ml  Net     50 ml    Cardiovascular: RRR, no m/r/g, no JVD, no cartoid bruits  Respiratory: CTAB  GI: abdomen soft, NT, ND  MSK: extremities are warm, no edema  Neuro: no focal deficits    Lab Results  Basic Metabolic Panel:  Recent Labs Lab 06/07/13 0849 06/08/13 0507  NA 142 140  K 3.9 4.1  CL 105 101  CO2 23 26  GLUCOSE 169* 151*  BUN 15 28*  CREATININE 0.75 1.02  CALCIUM 9.6 9.3    Liver Function Tests:  Recent Labs Lab 06/07/13 0849  AST 22  ALT 24  ALKPHOS 96  BILITOT 0.3  PROT 7.6  ALBUMIN 3.8    CBC:  Recent Labs Lab 06/07/13 0849 06/08/13 0507  WBC 12.0* 11.5*  NEUTROABS 9.2*  --   HGB 12.7 11.0*  HCT 38.0 32.6*  MCV 90.3 88.1  PLT PLATELET CLUMPS NOTED ON SMEAR, COUNT APPEARS ADEQUATE 253    Cardiac Enzymes:  Recent Labs Lab 06/07/13 0849 06/07/13 1228 06/07/13 1814 06/07/13 2332  TROPONINI <0.30 <0.30 <0.30 <0.30    BNP: No components found with this basename: POCBNP,    ECG EKG NSR, TWI anteroseptal leads new from prior EKGs.  Imaging 03/2013 Echo LVEF 65-70%, moderate to severe basal septal hypertrophy, grade I diastolic dysfunction  Impression/Recommendations 1. Acute diastolic heart failure - somewhat atypical presentation given the acute onset of symptoms of heart failure - given such rapid onset of symptoms and EKG changes concerning that ischemia could be a possible cause of sudden decompensated diastolic heart failure. She has multiple CAD risk factors.  - will obtain Lexiscan MPI in AM to further evaluate - symptoms improved, appears fairly euvolemic on exam. Continue oral lasix.   Carlyle Dolly, M.D., F.A.C.C.

## 2013-06-09 ENCOUNTER — Inpatient Hospital Stay (HOSPITAL_COMMUNITY): Payer: Medicare Other

## 2013-06-09 ENCOUNTER — Encounter (HOSPITAL_COMMUNITY): Payer: Self-pay

## 2013-06-09 DIAGNOSIS — I10 Essential (primary) hypertension: Secondary | ICD-10-CM

## 2013-06-09 DIAGNOSIS — R0989 Other specified symptoms and signs involving the circulatory and respiratory systems: Secondary | ICD-10-CM

## 2013-06-09 DIAGNOSIS — E440 Moderate protein-calorie malnutrition: Secondary | ICD-10-CM

## 2013-06-09 DIAGNOSIS — I5031 Acute diastolic (congestive) heart failure: Secondary | ICD-10-CM

## 2013-06-09 DIAGNOSIS — R0609 Other forms of dyspnea: Secondary | ICD-10-CM

## 2013-06-09 LAB — BASIC METABOLIC PANEL
BUN: 37 mg/dL — ABNORMAL HIGH (ref 6–23)
CO2: 28 mEq/L (ref 19–32)
Calcium: 9 mg/dL (ref 8.4–10.5)
Chloride: 104 mEq/L (ref 96–112)
Creatinine, Ser: 1.01 mg/dL (ref 0.50–1.10)
GFR calc Af Amer: 58 mL/min — ABNORMAL LOW (ref >90)
GFR calc non Af Amer: 50 mL/min — ABNORMAL LOW (ref >90)
Glucose, Bld: 94 mg/dL (ref 70–99)
Potassium: 3.8 mEq/L (ref 3.7–5.3)
Sodium: 142 mEq/L (ref 137–147)

## 2013-06-09 LAB — GLUCOSE, CAPILLARY
Glucose-Capillary: 91 mg/dL (ref 70–99)
Glucose-Capillary: 101 mg/dL — ABNORMAL HIGH (ref 70–99)

## 2013-06-09 IMAGING — NM NM MYOCAR SINGLE W/SPECT W/WALL MOTION & EF
2 series · 12 of 12 positions shown · non-contrast
Comparison: none

[cr cardiac tc low dose · 6.41mm/px · 6 of 64 frames shown]
[frame 6/64]
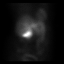
[frame 16/64]
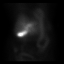
[frame 27/64]
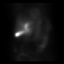
[frame 38/64]
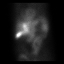
[frame 48/64]
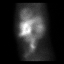
[frame 59/64]
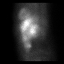

[cs cardiac tc hi dose · 6.41mm/px · 6 of 512 frames shown]
[frame 43/512]
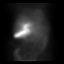
[frame 128/512]
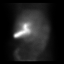
[frame 214/512]
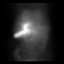
[frame 299/512]
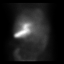
[frame 384/512]
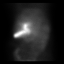
[frame 470/512]
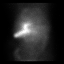

[12 of 12 positions shown; findings below may reference images not displayed]

CLINICAL DATA
84-year-old female with no known history of coronary artery disease
referred for shortness of breath.

EXAM
MYOCARDIAL IMAGING WITH SPECT (REST AND PHARMACOLOGIC-STRESS)

GATED LEFT VENTRICULAR WALL MOTION STUDY

LEFT VENTRICULAR EJECTION FRACTION

TECHNIQUE
Standard myocardial SPECT imaging was performed after resting
intravenous injection of 10 mCi [EH] sestamibi. Subsequently,
intravenous infusion of Lexiscan was performed under the supervision
of the Cardiology staff. At peak effect of the drug, 30 mCi [EH]
sestamibi was injected intravenously and standard myocardial SPECT
imaging was performed. Quantitative gated imaging was also performed
to evaluate left ventricular wall motion, and estimate left
ventricular ejection fraction.

COMPARISON
None.

FINDINGS
Pharmacological stress

Baseline EKG showed normal sinus rhythm with nonspecific ST/T
changes. After injection heart rate increased from 50 beats per min
up to 87 beats per min and blood pressure increased from 102/69 up
to 137/81. The test was stopped after injection was complete, the
patient did not experience any chest pain. The post-injection EKG
showed no ischemic changes and no significant arrhythmias.

Myocardial perfusion imaging

Raw images showed appropriate radiotracer uptake. There were no
myocardial perfusion defects. Gated imaging showed end-diastolic
volume 69 mL, end systolic volume 14 mL, left ventricular ejection
fraction at 80%, t.i.d. 1.03. Wall motion was hyperdynamic.

IMPRESSION
1.  Negative Lexiscan MPI for ischemia

2. Hyperdynamic LV systolic function with no wall motion
abnormalities

3.  Low risk study for major cardiac events.

SIGNATURE

## 2013-06-09 MED ORDER — TECHNETIUM TC 99M SESTAMIBI GENERIC - CARDIOLITE
30.0000 | Freq: Once | INTRAVENOUS | Status: AC | PRN
  Administered 2013-06-09: 30 via INTRAVENOUS

## 2013-06-09 MED ORDER — ASPIRIN 81 MG PO CHEW: 81.0000 mg | CHEWABLE_TABLET | Freq: Every day | ORAL | Status: DC

## 2013-06-09 MED ORDER — REGADENOSON 0.4 MG/5ML IV SOLN
INTRAVENOUS | Status: AC
  Administered 2013-06-09: 0.4 mg via INTRAVENOUS
  Filled 2013-06-09: qty 5

## 2013-06-09 MED ORDER — TECHNETIUM TC 99M SESTAMIBI - CARDIOLITE
10.0000 | Freq: Once | INTRAVENOUS | Status: AC | PRN
  Administered 2013-06-09: 08:00:00 10 via INTRAVENOUS

## 2013-06-09 MED ORDER — SODIUM CHLORIDE 0.9 % IJ SOLN
INTRAMUSCULAR | Status: AC
Start: 2013-06-09 — End: 2013-06-09
  Administered 2013-06-09: 10 mL via INTRAVENOUS
  Filled 2013-06-09: qty 10

## 2013-06-09 MED ORDER — ASPIRIN 81 MG PO CHEW
81.0000 mg | CHEWABLE_TABLET | Freq: Every day | ORAL | Status: DC
  Administered 2013-06-09: 81 mg via ORAL
  Filled 2013-06-09: qty 1

## 2013-06-09 MED ORDER — FUROSEMIDE 20 MG PO TABS: 20.0000 mg | ORAL_TABLET | ORAL | Status: DC

## 2013-06-09 NOTE — Discharge Instructions (Signed)
Chest Pain (Nonspecific) °It is often hard to give a specific diagnosis for the cause of chest pain. There is always a chance that your pain could be related to something serious, such as a heart attack or a blood clot in the lungs. You need to follow up with your caregiver for further evaluation. °CAUSES  °· Heartburn. °· Pneumonia or bronchitis. °· Anxiety or stress. °· Inflammation around your heart (pericarditis) or lung (pleuritis or pleurisy). °· A blood clot in the lung. °· A collapsed lung (pneumothorax). It can develop suddenly on its own (spontaneous pneumothorax) or from injury (trauma) to the chest. °· Shingles infection (herpes zoster virus). °The chest wall is composed of bones, muscles, and cartilage. Any of these can be the source of the pain. °· The bones can be bruised by injury. °· The muscles or cartilage can be strained by coughing or overwork. °· The cartilage can be affected by inflammation and become sore (costochondritis). °DIAGNOSIS  °Lab tests or other studies, such as X-rays, electrocardiography, stress testing, or cardiac imaging, may be needed to find the cause of your pain.  °TREATMENT  °· Treatment depends on what may be causing your chest pain. Treatment may include: °· Acid blockers for heartburn. °· Anti-inflammatory medicine. °· Pain medicine for inflammatory conditions. °· Antibiotics if an infection is present. °· You may be advised to change lifestyle habits. This includes stopping smoking and avoiding alcohol, caffeine, and chocolate. °· You may be advised to keep your head raised (elevated) when sleeping. This reduces the chance of acid going backward from your stomach into your esophagus. °· Most of the time, nonspecific chest pain will improve within 2 to 3 days with rest and mild pain medicine. °HOME CARE INSTRUCTIONS  °· If antibiotics were prescribed, take your antibiotics as directed. Finish them even if you start to feel better. °· For the next few days, avoid physical  activities that bring on chest pain. Continue physical activities as directed. °· Do not smoke. °· Avoid drinking alcohol. °· Only take over-the-counter or prescription medicine for pain, discomfort, or fever as directed by your caregiver. °· Follow your caregiver's suggestions for further testing if your chest pain does not go away. °· Keep any follow-up appointments you made. If you do not go to an appointment, you could develop lasting (chronic) problems with pain. If there is any problem keeping an appointment, you must call to reschedule. °SEEK MEDICAL CARE IF:  °· You think you are having problems from the medicine you are taking. Read your medicine instructions carefully. °· Your chest pain does not go away, even after treatment. °· You develop a rash with blisters on your chest. °SEEK IMMEDIATE MEDICAL CARE IF:  °· You have increased chest pain or pain that spreads to your arm, neck, jaw, back, or abdomen. °· You develop shortness of breath, an increasing cough, or you are coughing up blood. °· You have severe back or abdominal pain, feel nauseous, or vomit. °· You develop severe weakness, fainting, or chills. °· You have a fever. °THIS IS AN EMERGENCY. Do not wait to see if the pain will go away. Get medical help at once. Call your local emergency services (911 in [place] Do not drive yourself to the hospital. °MAKE SURE YOU:  °· Understand these instructions. °· Will watch your condition. °· Will get help right away if you are not doing well or get worse. °Document Released: 12/26/2004 Document Revised: 06/10/2011 Document Reviewed: 10/22/2007 °ExitCare® Patient Information ©2014 [employer]   [employer]. ° °

## 2013-06-09 NOTE — Discharge Summary (Signed)
Physician Discharge Summary  Renee Blake [id number] DOB: 1928-10-26 DOA: 06/07/2013  PCP: Celedonio Savage, MD  Admit date: 06/07/2013 Discharge date: 06/09/2013  Time spent: 40 minutes  Recommendations for Outpatient Follow-up:  1. Followup with cardiology in one to 2 weeks. 2. Followup primary care physician in 2 weeks.  Discharge Diagnoses:  Active Problems:   CHF (congestive heart failure)   Acute diastolic congestive heart failure   Diabetes mellitus   Discharge Condition: Improved  Diet recommendation: Low salt, low carb  Filed Weights   06/07/13 1211 06/08/13 0501 06/09/13 0411  Weight: 53 kg (116 lb 13.5 oz) 52.6 kg (115 lb 15.4 oz) 55.9 kg (123 lb 3.8 oz)    History of present illness:  80 year old woman with history of diastolic dysfunction by echocardiogram but no formal history of CHF presented to the emergency department today with sudden onset of shortness of breath. Initial evaluation suggested heart failure the patient was referred for admission after being treated with Lasix with excellent diuresis.  History obtained from patient as well as 2 daughters at bedside. There is no history of heart failure but they are aware of, no history of heart disease. Patient was last hospitalized in December for GI bleed. During that hospitalization an echocardiogram was done which revealed LVEF 65-70% with normal wall motion, grade 1 diastolic dysfunction. The patient was noted to be euvolemic and did not require diuretics.  Several weeks ago or longer the patient was started on amlodipine for hypertension. She then developed bilateral lower extremity edema he was placed on Lasix by her primary care physician. No significant shortness of breath. Approximately 1 week ago primary care physician recommended discontinuing amlodipine and Lasix. Lower extremity edema improved. Per family the patient was seen at [facility] emergency department 3/3, diagnosed with "congestion", placed on  antibiotic and discharged. Records are not currently available.  Yesterday the patient felt fine. Today the patient was lying down in bed when she suddenly became very short of breath. She felt hot but had no diaphoresis, chest pain, no pain in left arm neck or jaw. No nausea or vomiting. No shortness of breath persisted until she came to the emergency department and was treated. Currently she feels better, has no chest pain or other complaints.  In the emergency department afebrile, vital signs stable. Minimal hypoxia.basic metabolic panel, hepatic function panel, troponin unremarkable. BNP 2396. WBC 12.0. Chest x-ray suggested acute CHF, left-sided pleural effusion. EKG showed sinus rhythm with LVH, T-wave inversion in anterior leads, consider ischemia, compared to previous study 03/26/2013, T-wave inversion is noted   Hospital Course:  This patient was admitted to the hospital with shortness of breath. She was found to have exacerbation of diastolic congestive heart failure. Patient was started on Lasix and has had good urine output. She was seen by cardiology and due to EKG changes she underwent stress test which was found to be unremarkable. Recommendations are for Lasix to be taken every other day. Patient feels significantly improved. She's been cleared for discharge by cardiology. She'll followup in one to 2 weeks.  Procedures:    Consultations:  Cardiology  Discharge Exam: Filed Vitals:   06/09/13 1324  BP: 132/54  Pulse: 57  Temp: 98.4 F (36.9 C)  Resp: 20    General: No acute distress Cardiovascular: S1, S2, regular rate and rhythm Respiratory: Clear to auscultation bilaterally  Discharge Instructions  Discharge Orders   Future Appointments Provider Department Dept Phone   11/08/2013 2:30 PM Rogene Houston, MD Linna Hoff  [place] [phone]   Future Orders Complete By Expires   (HEART FAILURE PATIENTS) Call MD:  Anytime you have any of the following  symptoms: 1) 3 pound weight gain in 24 hours or 5 pounds in 1 week 2) shortness of breath, with or without a dry hacking cough 3) swelling in the hands, feet or stomach 4) if you have to sleep on extra pillows at night in order to breathe.  As directed    Call MD for:  difficulty breathing, headache or visual disturbances  As directed    Call MD for:  severe uncontrolled pain  As directed    Diet - low sodium heart healthy  As directed    Increase activity slowly  As directed        Medication List         aspirin 81 MG chewable tablet  Chew 1 tablet (81 mg total) by mouth daily.     atenolol 50 MG tablet  Commonly known as:  TENORMIN  Take 50 mg by mouth 2 (two) times daily.     EYE DROPS OP  Apply 2 drops to eye daily as needed (dry eyes).     furosemide 20 MG tablet  Commonly known as:  LASIX  Take 1 tablet (20 mg total) by mouth every other day.     lisinopril 10 MG tablet  Commonly known as:  PRINIVIL,ZESTRIL  Take 10 mg by mouth every morning.     loperamide 2 MG tablet  Commonly known as:  IMODIUM A-D  Take 4 mg by mouth daily as needed for diarrhea or loose stools.     lovastatin 20 MG tablet  Commonly known as:  MEVACOR  Take 20 mg by mouth at bedtime.     meclizine 25 MG tablet  Commonly known as:  ANTIVERT  Take 25 mg by mouth 2 (two) times daily as needed for dizziness (VERTIGO).     pantoprazole 40 MG tablet  Commonly known as:  PROTONIX  Take 40 mg by mouth 2 (two) times daily.     potassium chloride 10 MEQ tablet  Commonly known as:  K-DUR,KLOR-CON  Take 10-20 mEq by mouth 2 (two) times daily. Two tablets take in the morning and one tablet taken in the evening     traMADol 50 MG tablet  Commonly known as:  ULTRAM  Take 50 mg by mouth 4 (four) times daily as needed for moderate pain or severe pain.       Allergies  Allergen Reactions  . Codeine Other (See Comments)    Reaction unknown  . Sulfa Antibiotics Other (See Comments)    Reactions  unknown       Follow-up Information   Follow up with Jory Sims, NP. Schedule an appointment as soon as possible for a visit in 2 weeks.   Specialty:  Nurse Practitioner   Contact information:   [address] [place] [address] [phone]       Follow up with Celedonio Savage, MD. Schedule an appointment as soon as possible for a visit in 2 weeks.   Specialty:  Family Medicine   Contact information:   [place] [address] [phone]        The results of significant diagnostics from this hospitalization (including imaging, microbiology, ancillary and laboratory) are listed below for reference.    Significant Diagnostic Studies: Nm Myocar Single W/spect W/wall Motion And Ef  06/09/2013  CLINICAL DATA 80 year old female with no known history of coronary artery disease referred for shortness of breath.  EXAM MYOCARDIAL IMAGING WITH SPECT (REST AND PHARMACOLOGIC-STRESS)  GATED LEFT VENTRICULAR WALL MOTION STUDY  LEFT VENTRICULAR EJECTION FRACTION  TECHNIQUE Standard myocardial SPECT imaging was performed after resting intravenous injection of 10 mCi Tc-44m sestamibi. Subsequently, intravenous infusion of Lexiscan was performed under the supervision of the Cardiology staff. At peak effect of the drug, 30 mCi Tc-59m sestamibi was injected intravenously and standard myocardial SPECT imaging was performed. Quantitative gated imaging was also performed to evaluate left ventricular wall motion, and estimate left ventricular ejection fraction.  COMPARISON None.  FINDINGS Pharmacological stress  Baseline EKG showed normal sinus rhythm with nonspecific ST/T changes. After injection heart rate increased from 50 beats per min up to 87 beats per min and blood pressure increased from 102/69 up to 137/81. The test was stopped after injection was complete, the patient did not experience any chest pain. The post-injection EKG showed no ischemic changes and no significant  arrhythmias.  Myocardial perfusion imaging  Raw images showed appropriate radiotracer uptake. There were no myocardial perfusion defects. Gated imaging showed end-diastolic volume 69 mL, end systolic volume 14 mL, left ventricular ejection fraction at 80%, t.i.d. 1.03. Wall motion was hyperdynamic.  IMPRESSION 1.  Negative Lexiscan MPI for ischemia  2. Hyperdynamic LV systolic function with no wall motion abnormalities  3.  Low risk study for major cardiac events.  SIGNATURE  Electronically Signed   By: Carlyle Dolly   On: 06/09/2013 11:49   US Venous Img Lower Bilateral  06/07/2013   CLINICAL DATA:  Bilateral lower extremity edema  EXAM: BILATERAL LOWER EXTREMITY VENOUS DUPLEX ULTRASOUND  TECHNIQUE: Gray-scale sonography with graded compression, as well as color Doppler and duplex ultrasound, were performed to evaluate the deep venous system from the level of the common femoral vein through the popliteal and proximal calf veins. Spectral Doppler was utilized to evaluate flow at rest and with distal augmentation maneuvers.  COMPARISON:  None.  FINDINGS: Real-time and Doppler interrogation of both lower extremity venous systems was performed. Flow in the venous structures of both lower extremities is spontaneous and phasic in all segments. There is normal compression and augmentation in the venous structures of both lower extremities. Venous Doppler signal is normal in all regions bilaterally. There is no thrombus in the deep or visualized superficial venous structures of either lower extremity. There is no deep venous incompetence in either lower extremity.  IMPRESSION: No evidence of deep venous thrombosis in either lower extremity.   Electronically Signed   By: Lowella Grip M.D.   On: 06/07/2013 15:33   Dg Chest Portable 1 View  06/07/2013   CLINICAL DATA:  Shortness of Breath  EXAM: PORTABLE CHEST - 1 VIEW  COMPARISON:  June 01, 2013 and February 26, 2013  FINDINGS: There is cardiomegaly with  interstitial edema in the lungs bilaterally. There is a minimal right effusion. There is no airspace consolidation. No adenopathy. No bone lesions.  IMPRESSION: Evidence of a degree of congestive heart failure.  No consolidation.   Electronically Signed   By: Lowella Grip M.D.   On: 06/07/2013 09:04    Microbiology: No results found for this or any previous visit (from the past 240 hour(s)).   Labs: Basic Metabolic Panel:  Recent Labs Lab 06/07/13 0849 06/08/13 0507 06/09/13 0507  NA 142 140 142  K 3.9 4.1 3.8  CL 105 101 104  CO2 23 26 28  GLUCOSE 169* 151* 94  BUN 15 28* 37*  CREATININE 0.75 1.02 1.01  CALCIUM 9.6 9.3 9.0   Liver Function Tests:  Recent Labs Lab 06/07/13 0849  AST 22  ALT 24  ALKPHOS 96  BILITOT 0.3  PROT 7.6  ALBUMIN 3.8   No results found for this basename: LIPASE, AMYLASE,  in the last 168 hours No results found for this basename: AMMONIA,  in the last 168 hours CBC:  Recent Labs Lab 06/07/13 0849 06/08/13 0507  WBC 12.0* 11.5*  NEUTROABS 9.2*  --   HGB 12.7 11.0*  HCT 38.0 32.6*  MCV 90.3 88.1  PLT PLATELET CLUMPS NOTED ON SMEAR, COUNT APPEARS ADEQUATE 253   Cardiac Enzymes:  Recent Labs Lab 06/07/13 0849 06/07/13 1228 06/07/13 1814 06/07/13 2332  TROPONINI <0.30 <0.30 <0.30 <0.30   BNP: BNP (last 3 results)  Recent Labs  06/07/13 0849  PROBNP 2396.0*   CBG:  Recent Labs Lab 06/08/13 1135 06/08/13 1635 06/08/13 2150 06/09/13 0714 06/09/13 1118  GLUCAP 120* 92 100* 91 101*       Signed:  Salvador Coupe  [facility] 06/09/2013, 1:50 PM

## 2013-06-09 NOTE — Progress Notes (Signed)
Patient ID: Roxan Yamamoto, female   DOB: Nov 27, 1928, 80 y.o.   MRN: [id number]     Subjective:   SOB improved   Objective:   Temp:  [97 F (36.1 C)-97.9 F (36.6 C)] 97 F (36.1 C) (03/11 0411) Pulse Rate:  [54-57] 54 (03/11 0411) Resp:  [20] 20 (03/11 0411) BP: (120-146)/(54-80) 146/75 mmHg (03/11 0411) SpO2:  [94 %-98 %] 97 % (03/11 0411) Weight:  [123 lb 3.8 oz (55.9 kg)] 123 lb 3.8 oz (55.9 kg) (03/11 0411) Last BM Date: 06/08/13  Filed Weights   06/07/13 1211 06/08/13 0501 06/09/13 0411  Weight: 116 lb 13.5 oz (53 kg) 115 lb 15.4 oz (52.6 kg) 123 lb 3.8 oz (55.9 kg)    Intake/Output Summary (Last 24 hours) at 06/09/13 0829 Last data filed at 06/08/13 1730  Gross per 24 hour  Intake    600 ml  Output    200 ml  Net    400 ml      Exam:  General:NAD  Resp: CTAB  Cardiac:RRR, 2/6 systolic murmur at apex,no JVD  JQ:BHALPFX soft, NT, ND  TKW:IOXBDZHGDJM are warm, no edema  Neuro: no focal deficits   Lab Results:  Basic Metabolic Panel:  Recent Labs Lab 06/07/13 0849 06/08/13 0507 06/09/13 0507  NA 142 140 142  K 3.9 4.1 3.8  CL 105 101 104  CO2 23 26 28   GLUCOSE 169* 151* 94  BUN 15 28* 37*  CREATININE 0.75 1.02 1.01  CALCIUM 9.6 9.3 9.0    Liver Function Tests:  Recent Labs Lab 06/07/13 0849  AST 22  ALT 24  ALKPHOS 96  BILITOT 0.3  PROT 7.6  ALBUMIN 3.8    CBC:  Recent Labs Lab 06/07/13 0849 06/08/13 0507  WBC 12.0* 11.5*  HGB 12.7 11.0*  HCT 38.0 32.6*  MCV 90.3 88.1  PLT PLATELET CLUMPS NOTED ON SMEAR, COUNT APPEARS ADEQUATE 253    Cardiac Enzymes:  Recent Labs Lab 06/07/13 1228 06/07/13 1814 06/07/13 2332  TROPONINI <0.30 <0.30 <0.30    BNP:  Recent Labs  06/07/13 0849  PROBNP 2396.0*    Coagulation: No results found for this basename: INR,  in the last 168 hours  ECG:   Medications:   Scheduled Medications: . aspirin  324 mg Oral Daily  . atenolol  50 mg Oral BID  . enoxaparin (LOVENOX)  injection  40 mg Subcutaneous Q24H  . furosemide  20 mg Oral Daily  . insulin aspart  0-5 Units Subcutaneous QHS  . insulin aspart  0-9 Units Subcutaneous TID WC  . lisinopril  10 mg Oral q morning - 10a  . pantoprazole  40 mg Oral BID  . potassium chloride  20 mEq Oral Daily   And  . potassium chloride  10 mEq Oral QHS  . regadenoson  0.4 mg Intravenous Once  . simvastatin  10 mg Oral q1800  . sodium chloride  3 mL Intravenous Q12H     Infusions:     PRN Medications:  sodium chloride, loperamide, nitroGLYCERIN, sodium chloride, technetium sestamibi generic, traMADol, traZODone     Assessment/Plan    1. Acute diastolic heart failure  - somewhat atypical presentation given the acute onset of symptoms of heart failure and volume overload - given such rapid onset of symptoms and EKG changes concerning that ischemia could be a possible cause of sudden decompensated diastolic heart failure. She has multiple CAD risk factors.  - will obtain Lexiscan MPI in AM to further evaluate  -  symptoms improved, labs suggest she is becoming more dry. Would change to every other day lasix at discharge, will hold today.  -change ASA to 81 mg daily       Carlyle Dolly, M.D., F.A.C.C.

## 2013-06-09 NOTE — Progress Notes (Signed)
Discharge instructions and prescriptions given, verbalized understanding, out in stable condition with staff via w/c. 

## 2013-06-09 NOTE — Progress Notes (Signed)
Stress Lab Nurses Notes - [place] 06/09/2013 Reason for doing test: CHF & SOB Type of test: Wille Glaser Nurse performing test: Gerrit Halls, RN Nuclear Medicine Tech: Melburn Hake Echo Tech: Not Applicable MD performing test: Branch/M.Lenze PA Family MD: inpatient Test explained and consent signed: yes IV started: 20g jelco, Saline lock flushed, No redness or edema and Saline lock from floor Symptoms: Dizziness  Treatment/Intervention: None Reason test stopped: protocol completed After recovery IV was: No redness or edema and Saline Lock flushed Patient to return to [place]. Med at : 9:15 Patient discharged: Transported back to room 309 via wc Patient's Condition upon discharge was: stable Comments: During test BP 124/62 & HR 87.  Recovery BP 133/71 & HR 69.  Symptoms resolved in recovery. Geanie Cooley T

## 2013-06-09 NOTE — Progress Notes (Signed)
Follow up cardiology progress note. Negative Lexiscan MPI for ischemia. No further cardiac testing or interventions planned at this time. Recommend lasix 20mg  every other day at discharge, she needs follow up with NP [place] in 1-2 weeks with repeat BMET. Will signoff of inpatient care.    Carlyle Dolly MD

## 2013-06-09 NOTE — Care Management Note (Signed)
    Page 1 of 1   06/09/2013     11:56:27 AM   CARE MANAGEMENT NOTE 06/09/2013  Patient:  Renee Blake, Renee Blake   Account Number:  [id number]  Date Initiated:  06/09/2013  Documentation initiated by:  Vladimir Creeks  Subjective/Objective Assessment:   Admitted with CHF. Pt lives at home with family.She has had [facility] before,[facility], and would liketo have the RN visit again. Daughter agrees that the RNishelpful to monitor patient's B/P and vitals, and C/P status after hosp. episode.     Action/Plan:   HH set up with [facility]- possible D/C today after  stress test   Anticipated DC Date:  06/09/2013   Anticipated DC Plan:  [place]  CM consult      [facility] Choice  HOME HEALTH   Choice offered to / List presented to:  C-1 Patient        [place] arranged  HH-1 RN  [place].   Status of service:  Completed, signed off Medicare Important Message given?  NA - LOS <3 / Initial given by admissions (If response is "NO", the following Medicare IM given date fields will be blank) Date Medicare IM given:   Date Additional Medicare IM given:    Discharge Disposition:  [place]  Per UR Regulation:    If discussed at Long Length of Stay Meetings, dates discussed:    Comments:  06/09/13 [place]

## 2013-06-09 NOTE — Care Management (Signed)
UR completed 

## 2013-06-24 ENCOUNTER — Ambulatory Visit (INDEPENDENT_AMBULATORY_CARE_PROVIDER_SITE_OTHER): Payer: Medicare Other | Admitting: Adult Health

## 2013-06-24 ENCOUNTER — Encounter: Payer: Self-pay | Admitting: Adult Health

## 2013-06-24 ENCOUNTER — Encounter (INDEPENDENT_AMBULATORY_CARE_PROVIDER_SITE_OTHER): Payer: Self-pay

## 2013-06-24 ENCOUNTER — Other Ambulatory Visit (INDEPENDENT_AMBULATORY_CARE_PROVIDER_SITE_OTHER): Payer: Self-pay | Admitting: Internal Medicine

## 2013-06-24 VITALS — BP 146/66 | HR 55 | Ht 66.0 in | Wt 119.0 lb

## 2013-06-24 DIAGNOSIS — K219 Gastro-esophageal reflux disease without esophagitis: Secondary | ICD-10-CM

## 2013-06-24 DIAGNOSIS — I509 Heart failure, unspecified: Secondary | ICD-10-CM

## 2013-06-24 DIAGNOSIS — I5031 Acute diastolic (congestive) heart failure: Secondary | ICD-10-CM

## 2013-06-24 DIAGNOSIS — R55 Syncope and collapse: Secondary | ICD-10-CM

## 2013-06-24 MED ORDER — PANTOPRAZOLE SODIUM 40 MG PO TBEC: 40.0000 mg | DELAYED_RELEASE_TABLET | Freq: Two times a day (BID) | ORAL | Status: DC

## 2013-06-24 NOTE — Assessment & Plan Note (Signed)
No recurrence currently. She is stable despite use of diuretics. She is to call us that she does become dizzy or lightheaded.

## 2013-06-24 NOTE — Progress Notes (Signed)
HPI: Renee Blake is an 80 year old female patient of Dr. Harl Bowie, we are following post hospitalization where she was admitted for acute on chronic diastolic CHF, with known history of diabetes. The patient was diuresis IV Lasix, the echocardiogram demonstrated LVEF is 65-70% with normal wall motion with grade 1 diastolic dysfunction. Apparently prior to admission the patient had been started on amlodipine for hypertension by primary care physician and developed severe bilateral lower shin edema, and then placed on Lasix. Secondary to the side effects she was discontinued on amlodipine and Lasix in the lower extremity edema essentially improved. However she presented to [facility] in the emergency room with recurrent fluid retention and dyspnea.    During hospitalization the patient will also underwent nuclear medicine stress test. He was found to be a low risk study, and negative for ischemia. I discharged the patient had improved symptoms, her weight on discharge was 123 pounds. She was discharged on Lasix 20 mg every other day, fosinopril 10 mg daily and atenolol 50 mg twice a day. Amlodipine was not restarted.    He comes today free of complaint. She is back to her normal routine, without recurrent shortness of breath, fluid retention, or discomfort.  Allergies  Allergen Reactions  . Codeine Other (See Comments)    Reaction unknown  . Sulfa Antibiotics Other (See Comments)    Reactions unknown    Current Outpatient Prescriptions  Medication Sig Dispense Refill  . aspirin 81 MG chewable tablet Chew 1 tablet (81 mg total) by mouth daily.  30 tablet  1  . atenolol (TENORMIN) 50 MG tablet Take 50 mg by mouth 2 (two) times daily.      . furosemide (LASIX) 20 MG tablet Take 1 tablet (20 mg total) by mouth every other day.  15 tablet  1  . lisinopril (PRINIVIL,ZESTRIL) 10 MG tablet Take 10 mg by mouth every morning.      . loperamide (IMODIUM A-D) 2 MG tablet Take 4 mg by mouth daily as  needed for diarrhea or loose stools.       . lovastatin (MEVACOR) 20 MG tablet Take 20 mg by mouth at bedtime.      . meclizine (ANTIVERT) 25 MG tablet Take 25 mg by mouth 2 (two) times daily as needed for dizziness (VERTIGO).      Marland Kitchen pantoprazole (PROTONIX) 40 MG tablet Take 1 tablet (40 mg total) by mouth 2 (two) times daily.  60 tablet  5  . potassium chloride (K-DUR,KLOR-CON) 10 MEQ tablet Take 10-20 mEq by mouth 2 (two) times daily. Two tablets take in the morning and one tablet taken in the evening      . traMADol (ULTRAM) 50 MG tablet Take 50 mg by mouth 4 (four) times daily as needed for moderate pain or severe pain.        No current facility-administered medications for this visit.    Past Medical History  Diagnosis Date  . Hypertension   . Hyperlipemia   . Gastric ulcer   . Skin cancer   . Chronic diarrhea   . Vertigo   . Sliding hiatal hernia   . Schatzki's ring   . Duodenal stricture   . GI bleed   . Microscopic colitis   . Acute blood loss anemia   . Dilation of biliary tract     biliary duct per 02/26/13 CT  . Pancreatic duct dilated 02/26/13  . Nephrolithiasis   . Esophageal stricture   . Diabetes  Past Surgical History  Procedure Laterality Date  . Nose surgery      for skin cancer  . Esophagogastroduodenoscopy N/A 02/27/2013    Procedure: ESOPHAGOGASTRODUODENOSCOPY (EGD);  Surgeon: Ladene Artist, MD;  Location: [facility] ENDOSCOPY;  Service: Endoscopy;  Laterality: N/A;  . Hot hemostasis N/A 02/27/2013    Procedure: HOT HEMOSTASIS (ARGON PLASMA COAGULATION/BICAP);  Surgeon: Ladene Artist, MD;  Location: [facility] ENDOSCOPY;  Service: Endoscopy;  Laterality: N/A;  . Esophagogastroduodenoscopy N/A 03/28/2013    Procedure: ESOPHAGOGASTRODUODENOSCOPY (EGD);  Surgeon: Rogene Houston, MD;  Location: [facility] ENDO SUITE;  Service: Endoscopy;  Laterality: N/A;    HBZ:JIRCVE of systems complete and found to be negative unless listed above  PHYSICAL EXAM BP 146/66  Pulse 55   Ht 5\' 6"  (1.676 m)  Wt 119 lb (53.978 kg)  BMI 19.22 kg/m2  SpO2 100%  General: Well developed, well nourished, in no acute distress Head: Eyes PERRLA, No xanthomas.   Normal cephalic and atramatic  Lungs: Clear bilaterally to auscultation and percussion. Heart: HRRR S1 S2, without MRG.  Pulses are 2+ & equal.            No carotid bruit. No JVD.  No abdominal bruits. No femoral bruits. Abdomen: Bowel sounds are positive, abdomen soft and non-tender without masses or                  Hernia's noted. Msk:  Back normal, normal gait. Normal strength and tone for age. Extremities: No clubbing, cyanosis or edema.  DP +1 Neuro: Alert and oriented X 3. Psych:  Good affect, responds appropriately    ASSESSMENT AND PLAN

## 2013-06-24 NOTE — Patient Instructions (Signed)
Your physician recommends that you schedule a follow-up appointment in: 3 months with Dr Branch     Your physician recommends that you continue on your current medications as directed. Please refer to the Current Medication list given to you today.    Thank you for choosing [facility] !         

## 2013-06-24 NOTE — Assessment & Plan Note (Signed)
She is well compensated currently. There is no evidence of fluid retention, dyspnea on exertion, or chest discomfort. She is pleased to be off amlodipine if she is concerned that that was part of her lower extremity edema problem. She is still taking Lasix 20 mg every other day. She is having good results from this. She is also weigh herself daily and this has not had any significant weight gain. She is avoiding salty foods.  I congratulated her on her lifestyle changes. She will be seen again in 3 months for ongoing assessment and management by Dr. Harl Bowie. No changes or labs at this time.

## 2013-06-24 NOTE — Progress Notes (Deleted)
Name: Renee Blake    DOB: [birth year]  Age: 80 y.o.  MR#: [id number]       PCP:  Celedonio Savage, MD      Insurance: Payor: [place] / Plan: BLUE MEDICARE / Product Type: *No Product type* /   CC:    Chief Complaint  Patient presents with  . Congestive Heart Failure    Diastolic    VS Filed Vitals:   06/24/13 1347  BP: 146/66  Pulse: 55  Height: 5\' 6"  (1.676 m)  Weight: 119 lb (53.978 kg)  SpO2: 100%    Weights Current Weight  06/24/13 119 lb (53.978 kg)  06/09/13 123 lb 3.8 oz (55.9 kg)  05/11/13 117 lb 6.4 oz (53.252 kg)    Blood Pressure  BP Readings from Last 3 Encounters:  06/24/13 146/66  06/09/13 132/54  05/11/13 132/70     Admit date:  (Not on file) Last encounter with RMR:  Visit date not found   Allergy Codeine and Sulfa antibiotics  Current Outpatient Prescriptions  Medication Sig Dispense Refill  . aspirin 81 MG chewable tablet Chew 1 tablet (81 mg total) by mouth daily.  30 tablet  1  . atenolol (TENORMIN) 50 MG tablet Take 50 mg by mouth 2 (two) times daily.      . furosemide (LASIX) 20 MG tablet Take 1 tablet (20 mg total) by mouth every other day.  15 tablet  1  . lisinopril (PRINIVIL,ZESTRIL) 10 MG tablet Take 10 mg by mouth every morning.      . loperamide (IMODIUM A-D) 2 MG tablet Take 4 mg by mouth daily as needed for diarrhea or loose stools.       . lovastatin (MEVACOR) 20 MG tablet Take 20 mg by mouth at bedtime.      . meclizine (ANTIVERT) 25 MG tablet Take 25 mg by mouth 2 (two) times daily as needed for dizziness (VERTIGO).      Marland Kitchen pantoprazole (PROTONIX) 40 MG tablet Take 1 tablet (40 mg total) by mouth 2 (two) times daily.  60 tablet  5  . potassium chloride (K-DUR,KLOR-CON) 10 MEQ tablet Take 10-20 mEq by mouth 2 (two) times daily. Two tablets take in the morning and one tablet taken in the evening      . traMADol (ULTRAM) 50 MG tablet Take 50 mg by mouth 4 (four) times daily as needed for moderate pain or severe  pain.        No current facility-administered medications for this visit.    Discontinued Meds:    Medications Discontinued During This Encounter  Medication Reason  . Tetrahydrozoline HCl (EYE DROPS OP) Error    Patient Active Problem List   Diagnosis Date Noted  . Diabetes mellitus 06/08/2013  . CHF (congestive heart failure) 06/07/2013  . Acute diastolic congestive heart failure 06/07/2013  . Syncope 03/26/2013  . Rectal bleeding 03/26/2013  . Dehydration 03/26/2013  . Generalized weakness 03/26/2013  . Malnutrition of moderate degree 03/01/2013  . Diarrhea 02/28/2013  . Loss of weight 02/28/2013  . Acute duodenal ulcer with hemorrhage, without mention of obstruction 02/27/2013  . GIB (gastrointestinal bleeding) 02/26/2013  . Acute blood loss anemia 02/26/2013  . UTI (lower urinary tract infection) 02/26/2013  . Hypertension 02/26/2013    LABS    Component Value Date/Time   NA 142 06/09/2013 0507   NA 140 06/08/2013 0507   NA 142 06/07/2013 0849   K 3.8 06/09/2013 0507   K  4.1 06/08/2013 0507   K 3.9 06/07/2013 0849   CL 104 06/09/2013 0507   CL 101 06/08/2013 0507   CL 105 06/07/2013 0849   CO2 28 06/09/2013 0507   CO2 26 06/08/2013 0507   CO2 23 06/07/2013 0849   GLUCOSE 94 06/09/2013 0507   GLUCOSE 151* 06/08/2013 0507   GLUCOSE 169* 06/07/2013 0849   BUN 37* 06/09/2013 0507   BUN 28* 06/08/2013 0507   BUN 15 06/07/2013 0849   CREATININE 1.01 06/09/2013 0507   CREATININE 1.02 06/08/2013 0507   CREATININE 0.75 06/07/2013 0849   CALCIUM 9.0 06/09/2013 0507   CALCIUM 9.3 06/08/2013 0507   CALCIUM 9.6 06/07/2013 0849   GFRNONAA 50* 06/09/2013 0507   GFRNONAA 50* 06/08/2013 0507   GFRNONAA 77* 06/07/2013 0849   GFRAA 58* 06/09/2013 0507   GFRAA 58* 06/08/2013 0507   GFRAA 89* 06/07/2013 0849   CMP     Component Value Date/Time   NA 142 06/09/2013 0507   K 3.8 06/09/2013 0507   CL 104 06/09/2013 0507   CO2 28 06/09/2013 0507   GLUCOSE 94 06/09/2013 0507   BUN 37* 06/09/2013 0507    CREATININE 1.01 06/09/2013 0507   CALCIUM 9.0 06/09/2013 0507   PROT 7.6 06/07/2013 0849   ALBUMIN 3.8 06/07/2013 0849   AST 22 06/07/2013 0849   ALT 24 06/07/2013 0849   ALKPHOS 96 06/07/2013 0849   BILITOT 0.3 06/07/2013 0849   GFRNONAA 50* 06/09/2013 0507   GFRAA 58* 06/09/2013 0507       Component Value Date/Time   WBC 11.5* 06/08/2013 0507   WBC 12.0* 06/07/2013 0849   WBC 8.1 05/11/2013 1507   HGB 11.0* 06/08/2013 0507   HGB 12.7 06/07/2013 0849   HGB 12.8 05/11/2013 1507   HCT 32.6* 06/08/2013 0507   HCT 38.0 06/07/2013 0849   HCT 38.9 05/11/2013 1507   MCV 88.1 06/08/2013 0507   MCV 90.3 06/07/2013 0849   MCV 88.0 05/11/2013 1507    Lipid Panel  No results found for this basename: chol, trig, hdl, cholhdl, vldl, ldlcalc    ABG No results found for this basename: phart, pco2, pco2art, po2, po2art, hco3, tco2, acidbasedef, o2sat     Lab Results  Component Value Date   TSH 0.812 03/26/2013   BNP (last 3 results)  Recent Labs  06/07/13 0849  PROBNP 2396.0*   Cardiac Panel (last 3 results) No results found for this basename: CKTOTAL, CKMB, TROPONINI, RELINDX,  in the last 72 hours  Iron/TIBC/Ferritin No results found for this basename: iron, tibc, ferritin     EKG Orders placed during the hospital encounter of 06/07/13  . EKG 12-LEAD  . EKG 12-LEAD  . EKG 12-LEAD  . EKG 12-LEAD  . EKG 12-LEAD  . EKG 12-LEAD  . EKG     Prior Assessment and Plan Problem List as of 06/24/2013     Cardiovascular and Mediastinum   Hypertension   Syncope   CHF (congestive heart failure)   Acute diastolic congestive heart failure     Digestive   GIB (gastrointestinal bleeding)   Acute duodenal ulcer with hemorrhage, without mention of obstruction   Rectal bleeding     Endocrine   Diabetes mellitus     Genitourinary   UTI (lower urinary tract infection)     Other   Acute blood loss anemia   Diarrhea   Loss of weight   Malnutrition of moderate degree   Dehydration   Generalized  weakness  Imaging: Nm Myocar Single W/spect W/wall Motion And Ef  06/09/2013   CLINICAL DATA 80 year old female with no known history of coronary artery disease referred for shortness of breath.  EXAM MYOCARDIAL IMAGING WITH SPECT (REST AND PHARMACOLOGIC-STRESS)  GATED LEFT VENTRICULAR WALL MOTION STUDY  LEFT VENTRICULAR EJECTION FRACTION  TECHNIQUE Standard myocardial SPECT imaging was performed after resting intravenous injection of 10 mCi Tc-77m sestamibi. Subsequently, intravenous infusion of Lexiscan was performed under the supervision of the Cardiology staff. At peak effect of the drug, 30 mCi Tc-38m sestamibi was injected intravenously and standard myocardial SPECT imaging was performed. Quantitative gated imaging was also performed to evaluate left ventricular wall motion, and estimate left ventricular ejection fraction.  COMPARISON None.  FINDINGS Pharmacological stress  Baseline EKG showed normal sinus rhythm with nonspecific ST/T changes. After injection heart rate increased from 50 beats per min up to 87 beats per min and blood pressure increased from 102/69 up to 137/81. The test was stopped after injection was complete, the patient did not experience any chest pain. The post-injection EKG showed no ischemic changes and no significant arrhythmias.  Myocardial perfusion imaging  Raw images showed appropriate radiotracer uptake. There were no myocardial perfusion defects. Gated imaging showed end-diastolic volume 69 mL, end systolic volume 14 mL, left ventricular ejection fraction at 80%, t.i.d. 1.03. Wall motion was hyperdynamic.  IMPRESSION 1.  Negative Lexiscan MPI for ischemia  2. Hyperdynamic LV systolic function with no wall motion abnormalities  3.  Low risk study for major cardiac events.  SIGNATURE  Electronically Signed   By: Carlyle Dolly   On: 06/09/2013 11:49   US Venous Img Lower Bilateral  06/07/2013   CLINICAL DATA:  Bilateral lower extremity edema  EXAM: BILATERAL LOWER  EXTREMITY VENOUS DUPLEX ULTRASOUND  TECHNIQUE: Gray-scale sonography with graded compression, as well as color Doppler and duplex ultrasound, were performed to evaluate the deep venous system from the level of the common femoral vein through the popliteal and proximal calf veins. Spectral Doppler was utilized to evaluate flow at rest and with distal augmentation maneuvers.  COMPARISON:  None.  FINDINGS: Real-time and Doppler interrogation of both lower extremity venous systems was performed. Flow in the venous structures of both lower extremities is spontaneous and phasic in all segments. There is normal compression and augmentation in the venous structures of both lower extremities. Venous Doppler signal is normal in all regions bilaterally. There is no thrombus in the deep or visualized superficial venous structures of either lower extremity. There is no deep venous incompetence in either lower extremity.  IMPRESSION: No evidence of deep venous thrombosis in either lower extremity.   Electronically Signed   By: Lowella Grip M.D.   On: 06/07/2013 15:33   Dg Chest Portable 1 View  06/07/2013   CLINICAL DATA:  Shortness of Breath  EXAM: PORTABLE CHEST - 1 VIEW  COMPARISON:  June 01, 2013 and February 26, 2013  FINDINGS: There is cardiomegaly with interstitial edema in the lungs bilaterally. There is a minimal right effusion. There is no airspace consolidation. No adenopathy. No bone lesions.  IMPRESSION: Evidence of a degree of congestive heart failure.  No consolidation.   Electronically Signed   By: Lowella Grip M.D.   On: 06/07/2013 09:04

## 2013-09-16 ENCOUNTER — Ambulatory Visit: Payer: Self-pay | Admitting: Cardiology

## 2013-09-16 ENCOUNTER — Emergency Department (HOSPITAL_COMMUNITY)
Admission: EM | Admit: 2013-09-16 | Discharge: 2013-09-16 | Disposition: A | Discharge status: Home / Self Care | Payer: Medicare Other | Attending: Emergency Medicine | Admitting: Emergency Medicine

## 2013-09-16 ENCOUNTER — Emergency Department (HOSPITAL_COMMUNITY): Payer: Medicare Other

## 2013-09-16 ENCOUNTER — Encounter (HOSPITAL_COMMUNITY): Payer: Self-pay | Admitting: Emergency Medicine

## 2013-09-16 DIAGNOSIS — R0602 Shortness of breath: Secondary | ICD-10-CM | POA: Insufficient documentation

## 2013-09-16 DIAGNOSIS — Z79899 Other long term (current) drug therapy: Secondary | ICD-10-CM | POA: Insufficient documentation

## 2013-09-16 DIAGNOSIS — Z85828 Personal history of other malignant neoplasm of skin: Secondary | ICD-10-CM | POA: Insufficient documentation

## 2013-09-16 DIAGNOSIS — K259 Gastric ulcer, unspecified as acute or chronic, without hemorrhage or perforation: Secondary | ICD-10-CM | POA: Insufficient documentation

## 2013-09-16 DIAGNOSIS — Z7982 Long term (current) use of aspirin: Secondary | ICD-10-CM | POA: Insufficient documentation

## 2013-09-16 DIAGNOSIS — Z862 Personal history of diseases of the blood and blood-forming organs and certain disorders involving the immune mechanism: Secondary | ICD-10-CM | POA: Insufficient documentation

## 2013-09-16 DIAGNOSIS — Z87442 Personal history of urinary calculi: Secondary | ICD-10-CM | POA: Insufficient documentation

## 2013-09-16 DIAGNOSIS — J309 Allergic rhinitis, unspecified: Secondary | ICD-10-CM | POA: Insufficient documentation

## 2013-09-16 DIAGNOSIS — E785 Hyperlipidemia, unspecified: Secondary | ICD-10-CM | POA: Insufficient documentation

## 2013-09-16 DIAGNOSIS — I10 Essential (primary) hypertension: Secondary | ICD-10-CM | POA: Insufficient documentation

## 2013-09-16 DIAGNOSIS — R61 Generalized hyperhidrosis: Secondary | ICD-10-CM | POA: Insufficient documentation

## 2013-09-16 DIAGNOSIS — E119 Type 2 diabetes mellitus without complications: Secondary | ICD-10-CM | POA: Insufficient documentation

## 2013-09-16 DIAGNOSIS — I509 Heart failure, unspecified: Secondary | ICD-10-CM | POA: Insufficient documentation

## 2013-09-16 LAB — BASIC METABOLIC PANEL
BUN: 11 mg/dL (ref 6–23)
CO2: 26 mEq/L (ref 19–32)
Calcium: 9.6 mg/dL (ref 8.4–10.5)
Chloride: 104 mEq/L (ref 96–112)
Creatinine, Ser: 0.78 mg/dL (ref 0.50–1.10)
GFR calc Af Amer: 87 mL/min — ABNORMAL LOW (ref >90)
GFR calc non Af Amer: 75 mL/min — ABNORMAL LOW (ref >90)
Glucose, Bld: 129 mg/dL — ABNORMAL HIGH (ref 70–99)
Potassium: 4.5 mEq/L (ref 3.7–5.3)
Sodium: 142 mEq/L (ref 137–147)

## 2013-09-16 LAB — CBC WITH DIFFERENTIAL/PLATELET
Basophils Absolute: 0 10*3/uL (ref 0.0–0.1)
Basophils Relative: 0 % (ref 0–1)
Eosinophils Absolute: 0.3 10*3/uL (ref 0.0–0.7)
Eosinophils Relative: 5 % (ref 0–5)
HCT: 35.5 % — ABNORMAL LOW (ref 36.0–46.0)
Hemoglobin: 11.7 g/dL — ABNORMAL LOW (ref 12.0–15.0)
Lymphocytes Relative: 23 % (ref 12–46)
Lymphs Abs: 1.4 10*3/uL (ref 0.7–4.0)
MCH: 29.3 pg (ref 26.0–34.0)
MCHC: 33 g/dL (ref 30.0–36.0)
MCV: 88.8 fL (ref 78.0–100.0)
Monocytes Absolute: 0.4 10*3/uL (ref 0.1–1.0)
Monocytes Relative: 6 % (ref 3–12)
Neutro Abs: 3.8 10*3/uL (ref 1.7–7.7)
Neutrophils Relative %: 66 % (ref 43–77)
Platelets: 249 10*3/uL (ref 150–400)
RBC: 4 MIL/uL (ref 3.87–5.11)
RDW: 13.1 % (ref 11.5–15.5)
WBC: 5.8 10*3/uL (ref 4.0–10.5)

## 2013-09-16 LAB — TROPONIN I: Troponin I: <0.3 ng/mL (ref <0.30)

## 2013-09-16 LAB — PRO B NATRIURETIC PEPTIDE: Pro B Natriuretic peptide (BNP): 1532 pg/mL — ABNORMAL HIGH (ref 0–450)

## 2013-09-16 IMAGING — CR DG CHEST 2V
2 series · 2 of 2 positions shown · non-contrast
Comparison: [DATE]

CLINICAL DATA: Shortness of breath.

EXAM:
CHEST  2 VIEW

[view not recorded (1 of 2)]
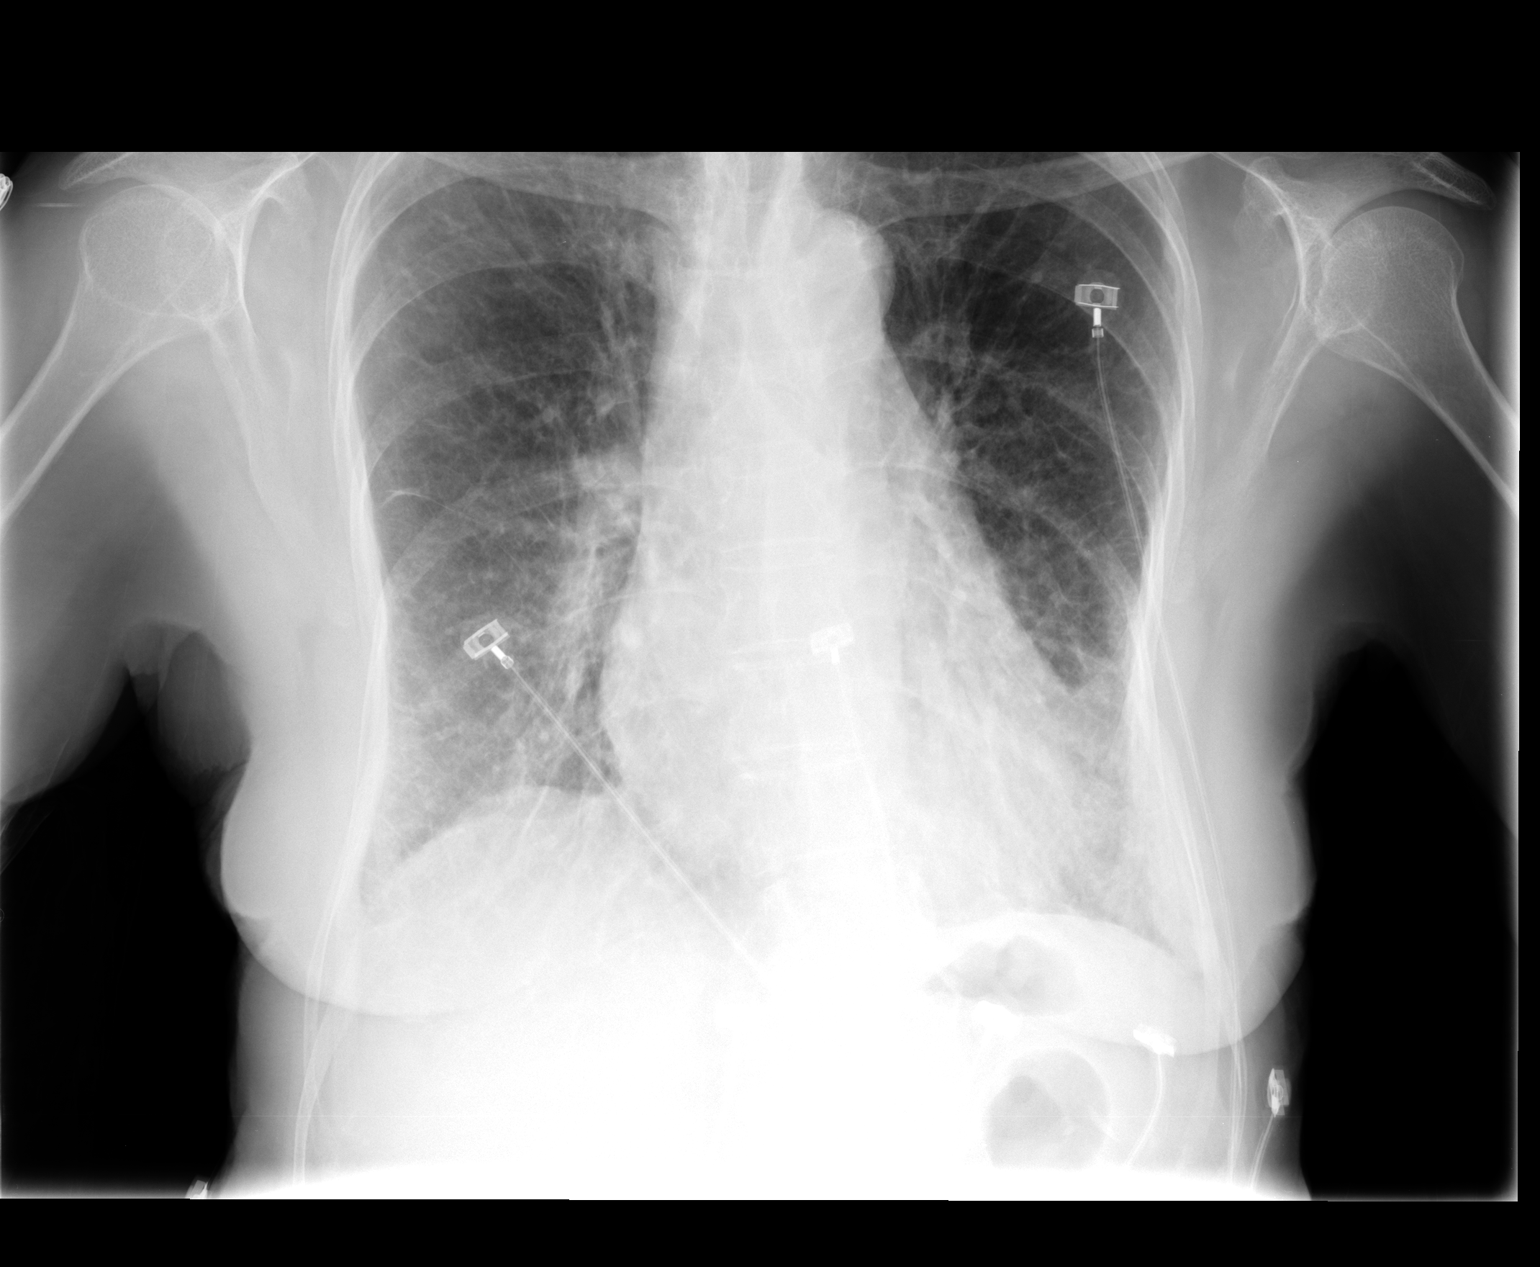

[view not recorded (2 of 2)]
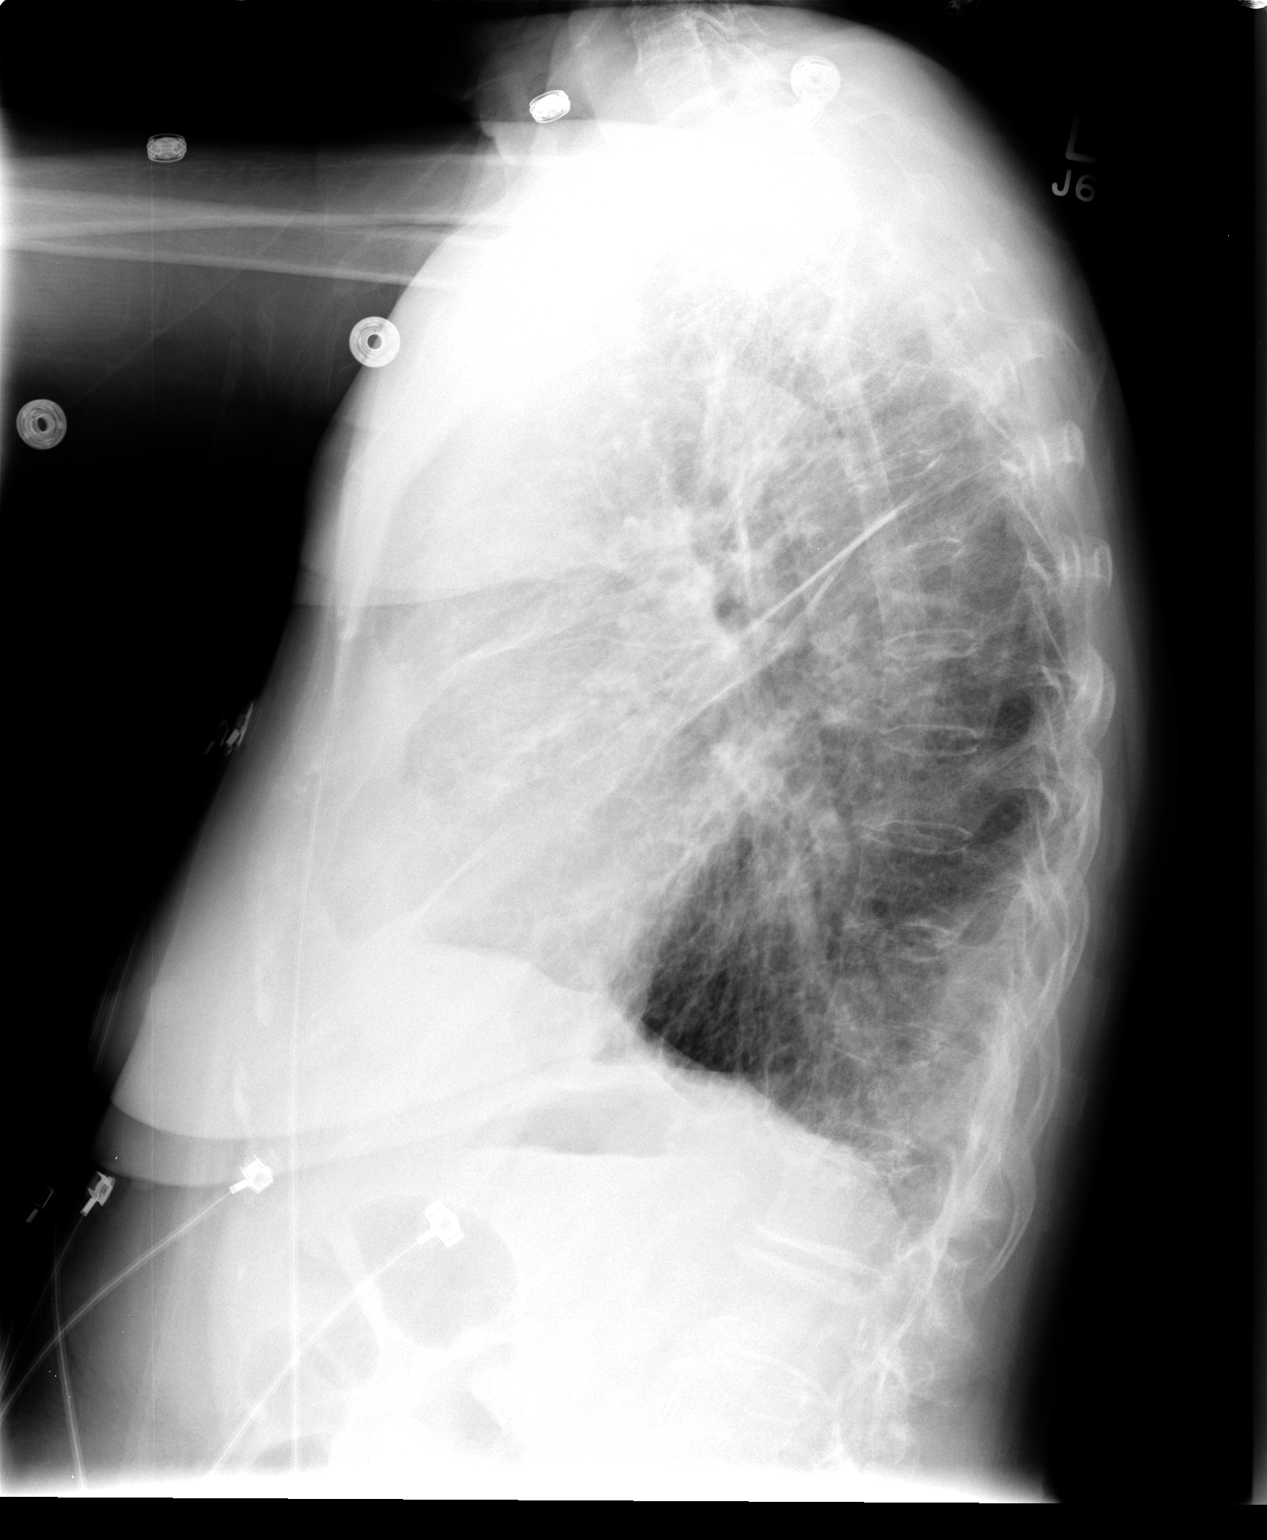

[2 of 2 positions shown; findings below may reference images not displayed]

FINDINGS: The heart is enlarged. There is prominent interstitial change
consistent with interstitial edema. Left pleural thickening or
pleural effusion is noted. There are no focal consolidations. No
overt alveolar edema.
IMPRESSION: Interstitial edema.

## 2013-09-16 NOTE — ED Notes (Signed)
PT ambulated to bathroom and denied any SOB on exertion.

## 2013-09-16 NOTE — ED Notes (Signed)
Patient c/o shortness of breath. Patient denies any chest pain. Patient reports hx of CHF. Per patient started today while sitting and sewing.

## 2013-09-16 NOTE — ED Notes (Signed)
MD at bedside. 

## 2013-09-16 NOTE — ED Provider Notes (Signed)
CSN: [id number]     Arrival date & time 09/16/13  1421 History  This chart was scribed for Fredia Sorrow, MD by Ludger Nutting, ED Scribe. This patient was seen in room APA14/APA14 and the patient's care was started 2:47 PM.    Chief Complaint  Patient presents with  . Shortness of Breath      The history is provided by the patient and a relative. No language interpreter was used.    HPI Comments: Renee Blake is a 80 y.o. female with past medical history of CHF, skin cancer who presents to the Emergency Department complaining of intermittent, unchanged, mild SOB that began earlier today at rest. She reports associated diaphoresis but this has since resolved. She has a history of CHF and states her symptoms today concerned her. She reports having leg swelling but states this is baseline for her. She denies abdominal pain, chest pain, nausea, increased leg swelling, cough.    Past Medical History  Diagnosis Date  . Hypertension   . Hyperlipemia   . Gastric ulcer   . Skin cancer   . Chronic diarrhea   . Vertigo   . Sliding hiatal hernia   . Schatzki's ring   . Duodenal stricture   . GI bleed   . Microscopic colitis   . Acute blood loss anemia   . Dilation of biliary tract     biliary duct per 02/26/13 CT  . Pancreatic duct dilated 02/26/13  . Nephrolithiasis   . Esophageal stricture   . Diabetes    Past Surgical History  Procedure Laterality Date  . Nose surgery      for skin cancer  . Esophagogastroduodenoscopy N/A 02/27/2013    Procedure: ESOPHAGOGASTRODUODENOSCOPY (EGD);  Surgeon: Ladene Artist, MD;  Location: [facility] ENDOSCOPY;  Service: Endoscopy;  Laterality: N/A;  . Hot hemostasis N/A 02/27/2013    Procedure: HOT HEMOSTASIS (ARGON PLASMA COAGULATION/BICAP);  Surgeon: Ladene Artist, MD;  Location: [facility] ENDOSCOPY;  Service: Endoscopy;  Laterality: N/A;  . Esophagogastroduodenoscopy N/A 03/28/2013    Procedure: ESOPHAGOGASTRODUODENOSCOPY (EGD);  Surgeon: Rogene Houston,  MD;  Location: [facility] ENDO SUITE;  Service: Endoscopy;  Laterality: N/A;   Family History  Problem Relation Age of Onset  . Heart attack Father    History  Substance Use Topics  . Smoking status: Never Smoker   . Smokeless tobacco: Never Used  . Alcohol Use: No   OB History   Grav Para Term Preterm Abortions TAB SAB Ect Mult Living   6 6 6       6      Review of Systems  Constitutional: Positive for chills and diaphoresis. Negative for fever.  HENT: Negative for rhinorrhea and sore throat.   Eyes: Negative for visual disturbance.  Respiratory: Positive for shortness of breath. Negative for cough.   Cardiovascular: Negative for chest pain and leg swelling.  Gastrointestinal: Negative for nausea, vomiting, abdominal pain and diarrhea.  Genitourinary: Negative for dysuria.  Musculoskeletal: Negative for back pain and neck pain.  Skin: Negative for rash.  Allergic/Immunologic: Positive for environmental allergies.  Neurological: Negative for headaches.  Hematological: Does not bruise/bleed easily.  Psychiatric/Behavioral: Negative for confusion.      Allergies  Codeine and Sulfa antibiotics  Home Medications   Prior to Admission medications   Medication Sig Start Date End Date Taking? Authorizing Provider  aspirin 81 MG chewable tablet Chew 1 tablet (81 mg total) by mouth daily. 06/09/13  Yes Kathie Dike, MD  atenolol (TENORMIN) 50 MG  tablet Take 50 mg by mouth 2 (two) times daily.   Yes Historical Provider, MD  cetirizine (ZYRTEC) 10 MG tablet Take 10 mg by mouth daily.   Yes Historical Provider, MD  furosemide (LASIX) 20 MG tablet Take 1 tablet (20 mg total) by mouth every other day. 06/09/13  Yes Kathie Dike, MD  lisinopril (PRINIVIL,ZESTRIL) 10 MG tablet Take 10 mg by mouth every morning.   Yes Historical Provider, MD  loperamide (IMODIUM A-D) 2 MG tablet Take 4 mg by mouth daily as needed for diarrhea or loose stools.    Yes Historical Provider, MD  lovastatin  (MEVACOR) 20 MG tablet Take 20 mg by mouth at bedtime.   Yes Historical Provider, MD  meclizine (ANTIVERT) 25 MG tablet Take 25 mg by mouth 2 (two) times daily as needed for dizziness (VERTIGO).   Yes Historical Provider, MD  pantoprazole (PROTONIX) 40 MG tablet Take 1 tablet (40 mg total) by mouth 2 (two) times daily. 06/24/13  Yes Butch Penny, NP  potassium chloride (K-DUR,KLOR-CON) 10 MEQ tablet Take 10-20 mEq by mouth 2 (two) times daily. Two tablets take in the morning and one tablet taken in the evening 03/10/13  Yes Historical Provider, MD  traMADol (ULTRAM) 50 MG tablet Take 50 mg by mouth 4 (four) times daily as needed for moderate pain or severe pain.    Yes Historical Provider, MD   BP 156/74  Pulse 63  Temp(Src) 97.7 F (36.5 C) (Oral)  Resp 18  Ht 5\' 6"  (1.676 m)  Wt 119 lb (53.978 kg)  BMI 19.22 kg/m2  SpO2 100% Physical Exam  Nursing note and vitals reviewed. Constitutional: She is oriented to person, place, and time. She appears well-developed and well-nourished.  HENT:  Head: Normocephalic and atraumatic.  Cardiovascular: Normal rate.   Pulmonary/Chest: Effort normal.  Abdominal: She exhibits no distension.  Neurological: She is alert and oriented to person, place, and time.  Skin: Skin is warm and dry.  Psychiatric: She has a normal mood and affect.    ED Course  Procedures (including critical care time)  DIAGNOSTIC STUDIES: Oxygen Saturation is 98% on RA, normal by my interpretation.    COORDINATION OF CARE: 2:50 PM Discussed treatment plan with pt at bedside and pt agreed to plan.   Labs Review Labs Reviewed  PRO B NATRIURETIC PEPTIDE - Abnormal; Notable for the following:    Pro B Natriuretic peptide (BNP) 1532.0 (*)    All other components within normal limits  CBC WITH DIFFERENTIAL - Abnormal; Notable for the following:    Hemoglobin 11.7 (*)    HCT 35.5 (*)    All other components within normal limits  BASIC METABOLIC PANEL - Abnormal; Notable  for the following:    Glucose, Bld 129 (*)    GFR calc non Af Amer 75 (*)    GFR calc Af Amer 87 (*)    All other components within normal limits  TROPONIN I   Results for orders placed during the hospital encounter of 09/16/13  PRO B NATRIURETIC PEPTIDE      Result Value Ref Range   Pro B Natriuretic peptide (BNP) 1532.0 (*) 0 - 450 pg/mL  CBC WITH DIFFERENTIAL      Result Value Ref Range   WBC 5.8  4.0 - 10.5 K/uL   RBC 4.00  3.87 - 5.11 MIL/uL   Hemoglobin 11.7 (*) 12.0 - 15.0 g/dL   HCT 35.5 (*) 36.0 - 46.0 %   MCV 88.8  78.0 - 100.0 fL   MCH 29.3  26.0 - 34.0 pg   MCHC 33.0  30.0 - 36.0 g/dL   RDW 13.1  11.5 - 15.5 %   Platelets 249  150 - 400 K/uL   Neutrophils Relative % 66  43 - 77 %   Neutro Abs 3.8  1.7 - 7.7 K/uL   Lymphocytes Relative 23  12 - 46 %   Lymphs Abs 1.4  0.7 - 4.0 K/uL   Monocytes Relative 6  3 - 12 %   Monocytes Absolute 0.4  0.1 - 1.0 K/uL   Eosinophils Relative 5  0 - 5 %   Eosinophils Absolute 0.3  0.0 - 0.7 K/uL   Basophils Relative 0  0 - 1 %   Basophils Absolute 0.0  0.0 - 0.1 K/uL  BASIC METABOLIC PANEL      Result Value Ref Range   Sodium 142  137 - 147 mEq/L   Potassium 4.5  3.7 - 5.3 mEq/L   Chloride 104  96 - 112 mEq/L   CO2 26  19 - 32 mEq/L   Glucose, Bld 129 (*) 70 - 99 mg/dL   BUN 11  6 - 23 mg/dL   Creatinine, Ser 0.78  0.50 - 1.10 mg/dL   Calcium 9.6  8.4 - 10.5 mg/dL   GFR calc non Af Amer 75 (*) >90 mL/min   GFR calc Af Amer 87 (*) >90 mL/min  TROPONIN I      Result Value Ref Range   Troponin I <0.30  <0.30 ng/mL     Imaging Review Dg Chest 2 View  09/16/2013   CLINICAL DATA:  Shortness of breath.  EXAM: CHEST  2 VIEW  COMPARISON:  06/07/2013  FINDINGS: The heart is enlarged. There is prominent interstitial change consistent with interstitial edema. Left pleural thickening or pleural effusion is noted. There are no focal consolidations. No overt alveolar edema.  IMPRESSION: Interstitial edema.   Electronically Signed    By: Shon Hale M.D.   On: 09/16/2013 16:13     EKG Interpretation   Date/Time:  Thursday September 16 2013 14:36:58 EDT Ventricular Rate:  64 PR Interval:  425 QRS Duration: 98 QT Interval:  443 QTC Calculation: 457 R Axis:   2 Text Interpretation:  Sinus rhythm Prolonged PR interval Left ventricular  hypertrophy Baseline wander in lead(s) V1 V3 No significant change since  last tracing Confirmed by Doneen Ollinger  MD, Nava Song [phone]) on 09/16/2013  2:45:23 PM      MDM   Final diagnoses:  Shortness of breath    Today's workup shows no evidence of frank congestive heart failure or pulmonary edema. Certainly have a propensity for that. Based on her BNP troponins also negative. Patient's shortness of breath symptoms have resolved. No evidence of acute cardiac event. Clinically do not feel this is consistent with a pulmonary embolus. Chest x-ray is also negative for pneumonia.  I personally performed the services described in this documentation, which was scribed in my presence. The recorded information has been reviewed and is accurate.    Fredia Sorrow, MD 09/16/13 [phone]

## 2013-09-16 NOTE — Discharge Instructions (Signed)
Today's workup without any significant findings. However return for anything newer worse or worsening shortness of breath or development of chest pain. As we discussed no evidence of frank congestive heart failure today.

## 2013-10-25 ENCOUNTER — Ambulatory Visit: Payer: Self-pay | Admitting: Cardiology

## 2013-11-08 ENCOUNTER — Ambulatory Visit (INDEPENDENT_AMBULATORY_CARE_PROVIDER_SITE_OTHER): Payer: Self-pay | Admitting: Internal Medicine

## 2013-11-21 ENCOUNTER — Encounter (HOSPITAL_COMMUNITY): Payer: Self-pay | Admitting: Emergency Medicine

## 2013-11-21 ENCOUNTER — Emergency Department (HOSPITAL_COMMUNITY)
Admission: EM | Admit: 2013-11-21 | Discharge: 2013-11-21 | Disposition: A | Discharge status: Home / Self Care | Payer: Medicare Other | Attending: Emergency Medicine | Admitting: Emergency Medicine

## 2013-11-21 DIAGNOSIS — H539 Unspecified visual disturbance: Secondary | ICD-10-CM | POA: Diagnosis not present

## 2013-11-21 DIAGNOSIS — Z8719 Personal history of other diseases of the digestive system: Secondary | ICD-10-CM | POA: Diagnosis not present

## 2013-11-21 DIAGNOSIS — E119 Type 2 diabetes mellitus without complications: Secondary | ICD-10-CM | POA: Diagnosis not present

## 2013-11-21 DIAGNOSIS — Z79899 Other long term (current) drug therapy: Secondary | ICD-10-CM | POA: Insufficient documentation

## 2013-11-21 DIAGNOSIS — Z862 Personal history of diseases of the blood and blood-forming organs and certain disorders involving the immune mechanism: Secondary | ICD-10-CM | POA: Insufficient documentation

## 2013-11-21 DIAGNOSIS — Z85828 Personal history of other malignant neoplasm of skin: Secondary | ICD-10-CM | POA: Diagnosis not present

## 2013-11-21 DIAGNOSIS — Z7982 Long term (current) use of aspirin: Secondary | ICD-10-CM | POA: Insufficient documentation

## 2013-11-21 DIAGNOSIS — R110 Nausea: Secondary | ICD-10-CM | POA: Diagnosis not present

## 2013-11-21 DIAGNOSIS — Z87442 Personal history of urinary calculi: Secondary | ICD-10-CM | POA: Diagnosis not present

## 2013-11-21 DIAGNOSIS — I10 Essential (primary) hypertension: Secondary | ICD-10-CM | POA: Diagnosis not present

## 2013-11-21 DIAGNOSIS — E785 Hyperlipidemia, unspecified: Secondary | ICD-10-CM | POA: Diagnosis not present

## 2013-11-21 LAB — BASIC METABOLIC PANEL WITH GFR
Anion gap: 12 (ref 5–15)
BUN: 12 mg/dL (ref 6–23)
CO2: 23 meq/L (ref 19–32)
Calcium: 9.9 mg/dL (ref 8.4–10.5)
Chloride: 103 meq/L (ref 96–112)
Creatinine, Ser: 0.96 mg/dL (ref 0.50–1.10)
GFR calc Af Amer: 62 mL/min — ABNORMAL LOW
GFR calc non Af Amer: 53 mL/min — ABNORMAL LOW
Glucose, Bld: 87 mg/dL (ref 70–99)
Potassium: 4.3 meq/L (ref 3.7–5.3)
Sodium: 138 meq/L (ref 137–147)

## 2013-11-21 LAB — PRO B NATRIURETIC PEPTIDE: Pro B Natriuretic peptide (BNP): 708.6 pg/mL — ABNORMAL HIGH (ref 0–450)

## 2013-11-21 LAB — TROPONIN I: Troponin I: <0.3 ng/mL

## 2013-11-21 MED ORDER — FUROSEMIDE 20 MG PO TABS: 20.0000 mg | ORAL_TABLET | ORAL | Status: DC

## 2013-11-21 NOTE — Discharge Instructions (Signed)

## 2013-11-21 NOTE — ED Notes (Signed)
Pt c/o elevated blood pressure, headache, nausea, vision blurry that started this am, pt advises that she has been having problems with her blood pressure being elevated over the past week, was given additional bp medication spironolactone 12.5 mg dosage to take along with her regular medication, reports that she had not change in bp, called pcp and was advised to increase dosage to 25 mg with no change in bp readings, started having headache this am,

## 2013-11-21 NOTE — ED Provider Notes (Signed)
CSN: [id number]     Arrival date & time 11/21/13  1143 History  This chart was scribed for [employer]. Alvino Chapel, MD by Starleen Arms, ED Scribe. This patient was seen in room APA02/APA02 and the patient's care was started at 12:06 PM.    Chief Complaint  Patient presents with  . Hypertension   The history is provided by the patient. No language interpreter was used.    HPI Comments: Renee Blake is a 80 y.o. female with a history of HTN who presents to the Emergency Department complaining of a gradual, unchanged headache onset 4:00 am today.  She states her PCP has been altering her BP medication since she was discharged after her admission 6 months ago for CHF.  She reports no complications relating to CHF since this time and states she was placed on Lasix during her admission but has since been taken off the medication.  She reports her blood pressure has been variable since this time.  She is currently prescribed atenolol, lisinopril, and amlodipine, with the amlodipine being added to her regimen 1.5 weeks ago by her PCP.    Patient reports she is able to tell when she has BP changes because she develops headache, nausea, and blurry vision all of which she currently states she is experiencing.  She reports her systolic BP was ~850 when it was recently measured by EMS.  Patient denies CP, weakness, SOB.   Past Medical History  Diagnosis Date  . Hypertension   . Hyperlipemia   . Gastric ulcer   . Skin cancer   . Chronic diarrhea   . Vertigo   . Sliding hiatal hernia   . Schatzki's ring   . Duodenal stricture   . GI bleed   . Microscopic colitis   . Acute blood loss anemia   . Dilation of biliary tract     biliary duct per 02/26/13 CT  . Pancreatic duct dilated 02/26/13  . Nephrolithiasis   . Esophageal stricture   . Diabetes    Past Surgical History  Procedure Laterality Date  . Nose surgery      for skin cancer  . Esophagogastroduodenoscopy N/A 02/27/2013    Procedure:  ESOPHAGOGASTRODUODENOSCOPY (EGD);  Surgeon: Ladene Artist, MD;  Location: [facility] ENDOSCOPY;  Service: Endoscopy;  Laterality: N/A;  . Hot hemostasis N/A 02/27/2013    Procedure: HOT HEMOSTASIS (ARGON PLASMA COAGULATION/BICAP);  Surgeon: Ladene Artist, MD;  Location: [facility] ENDOSCOPY;  Service: Endoscopy;  Laterality: N/A;  . Esophagogastroduodenoscopy N/A 03/28/2013    Procedure: ESOPHAGOGASTRODUODENOSCOPY (EGD);  Surgeon: Rogene Houston, MD;  Location: [facility] ENDO SUITE;  Service: Endoscopy;  Laterality: N/A;   Family History  Problem Relation Age of Onset  . Heart attack Father    History  Substance Use Topics  . Smoking status: Never Smoker   . Smokeless tobacco: Never Used  . Alcohol Use: No   OB History   Grav Para Term Preterm Abortions TAB SAB Ect Mult Living   6 6 6       6      Review of Systems  Eyes: Positive for visual disturbance.  Respiratory: Negative for shortness of breath.   Cardiovascular: Negative for chest pain.  Gastrointestinal: Positive for nausea. Negative for vomiting.  Neurological: Positive for headaches. Negative for weakness.     Allergies  Codeine and Sulfa antibiotics  Home Medications   Prior to Admission medications   Medication Sig Start Date End Date Taking? Authorizing Provider  aspirin 81 MG  chewable tablet Chew 1 tablet (81 mg total) by mouth daily. 06/09/13  Yes Kathie Dike, MD  atenolol (TENORMIN) 50 MG tablet Take 50 mg by mouth daily.    Yes Historical Provider, MD  cetirizine (ZYRTEC) 10 MG tablet Take 10 mg by mouth daily.   Yes Historical Provider, MD  lisinopril (PRINIVIL,ZESTRIL) 40 MG tablet Take 40 mg by mouth daily.   Yes Historical Provider, MD  loperamide (IMODIUM A-D) 2 MG tablet Take 2 mg by mouth every 3 (three) hours.    Yes Historical Provider, MD  lovastatin (MEVACOR) 20 MG tablet Take 20 mg by mouth at bedtime.   Yes Historical Provider, MD  pantoprazole (PROTONIX) 40 MG tablet Take 40 mg by mouth daily.   Yes Historical  Provider, MD  potassium chloride (K-DUR,KLOR-CON) 10 MEQ tablet Take 10 mEq by mouth 4 (four) times daily.  03/10/13  Yes Historical Provider, MD  furosemide (LASIX) 20 MG tablet Take 1 tablet (20 mg total) by mouth every other day. 11/21/13   Jasper Riling. Tully Burgo, MD   BP 154/77  Pulse 68  Temp(Src) 98.4 F (36.9 C) (Oral)  Resp 16  Ht 5\' 6"  (1.676 m)  Wt 115 lb (52.164 kg)  BMI 18.57 kg/m2  SpO2 100% Physical Exam  Nursing note and vitals reviewed. Neck:  No JVD  Cardiovascular: Normal rate, regular rhythm and normal heart sounds.   Pulmonary/Chest: Effort normal and breath sounds normal. No respiratory distress. She has no wheezes. She has no rales.  Musculoskeletal: She exhibits no edema.    ED Course  Procedures (including critical care time)   COORDINATION OF CARE:  12:16 PM Discussed treatment plan with patient at bedside.  Patient acknowledges and agrees with plan.    Labs Review Labs Reviewed  PRO B NATRIURETIC PEPTIDE - Abnormal; Notable for the following:    Pro B Natriuretic peptide (BNP) 708.6 (*)    All other components within normal limits  BASIC METABOLIC PANEL - Abnormal; Notable for the following:    GFR calc non Af Amer 53 (*)    GFR calc Af Amer 62 (*)    All other components within normal limits  TROPONIN I    Imaging Review No results found.   EKG Interpretation   Date/Time:  Sunday November 21 2013 12:23:40 EDT Ventricular Rate:  63 PR Interval:  174 QRS Duration: 85 QT Interval:  410 QTC Calculation: 420 R Axis:   -14 Text Interpretation:  Sinus rhythm LVH with secondary repolarization  abnormality ED PHYSICIAN INTERPRETATION AVAILABLE IN [facility]  Confirmed by TEST, Record ([id number]) on 11/23/2013 7:10:21 AM      MDM   Final diagnoses:  Essential hypertension    Patient with headache. May related to hypertension.  has had Some changes in her medication. Will add Lasix back. Discharge home to followup with her Dr.  I  personally performed the services described in this documentation, which was scribed in my presence. The recorded information has been reviewed and is accurate.    Jasper Riling. Alvino Chapel, MD 11/23/13 [id number]

## 2013-11-21 NOTE — ED Notes (Signed)
Pt states that her blood pressure reading at home was 201/89

## 2013-12-02 ENCOUNTER — Ambulatory Visit (INDEPENDENT_AMBULATORY_CARE_PROVIDER_SITE_OTHER): Payer: Self-pay | Admitting: Internal Medicine

## 2013-12-02 ENCOUNTER — Emergency Department (HOSPITAL_COMMUNITY): Payer: Medicare Other

## 2013-12-02 ENCOUNTER — Emergency Department (HOSPITAL_COMMUNITY)
Admission: EM | Admit: 2013-12-02 | Discharge: 2013-12-02 | Disposition: A | Discharge status: Home / Self Care | Payer: Medicare Other | Attending: Emergency Medicine | Admitting: Emergency Medicine

## 2013-12-02 ENCOUNTER — Encounter (HOSPITAL_COMMUNITY): Payer: Self-pay | Admitting: Emergency Medicine

## 2013-12-02 ENCOUNTER — Telehealth (INDEPENDENT_AMBULATORY_CARE_PROVIDER_SITE_OTHER): Payer: Self-pay | Admitting: *Deleted

## 2013-12-02 DIAGNOSIS — R5381 Other malaise: Secondary | ICD-10-CM | POA: Diagnosis not present

## 2013-12-02 DIAGNOSIS — Z87442 Personal history of urinary calculi: Secondary | ICD-10-CM | POA: Insufficient documentation

## 2013-12-02 DIAGNOSIS — Z85828 Personal history of other malignant neoplasm of skin: Secondary | ICD-10-CM | POA: Diagnosis not present

## 2013-12-02 DIAGNOSIS — Z862 Personal history of diseases of the blood and blood-forming organs and certain disorders involving the immune mechanism: Secondary | ICD-10-CM | POA: Diagnosis not present

## 2013-12-02 DIAGNOSIS — E785 Hyperlipidemia, unspecified: Secondary | ICD-10-CM | POA: Diagnosis not present

## 2013-12-02 DIAGNOSIS — Z8775 Personal history of (corrected) congenital malformations of respiratory system: Secondary | ICD-10-CM | POA: Diagnosis not present

## 2013-12-02 DIAGNOSIS — R1031 Right lower quadrant pain: Secondary | ICD-10-CM | POA: Insufficient documentation

## 2013-12-02 DIAGNOSIS — E119 Type 2 diabetes mellitus without complications: Secondary | ICD-10-CM | POA: Insufficient documentation

## 2013-12-02 DIAGNOSIS — Z7982 Long term (current) use of aspirin: Secondary | ICD-10-CM | POA: Diagnosis not present

## 2013-12-02 DIAGNOSIS — K921 Melena: Secondary | ICD-10-CM

## 2013-12-02 DIAGNOSIS — R1032 Left lower quadrant pain: Secondary | ICD-10-CM | POA: Insufficient documentation

## 2013-12-02 DIAGNOSIS — R195 Other fecal abnormalities: Secondary | ICD-10-CM | POA: Insufficient documentation

## 2013-12-02 DIAGNOSIS — I10 Essential (primary) hypertension: Secondary | ICD-10-CM | POA: Insufficient documentation

## 2013-12-02 DIAGNOSIS — R5383 Other fatigue: Secondary | ICD-10-CM

## 2013-12-02 DIAGNOSIS — Z79899 Other long term (current) drug therapy: Secondary | ICD-10-CM | POA: Diagnosis not present

## 2013-12-02 DIAGNOSIS — K625 Hemorrhage of anus and rectum: Secondary | ICD-10-CM | POA: Diagnosis present

## 2013-12-02 LAB — COMPREHENSIVE METABOLIC PANEL
ALT: 19 U/L (ref 0–35)
AST: 20 U/L (ref 0–37)
Albumin: 4 g/dL (ref 3.5–5.2)
Alkaline Phosphatase: 69 U/L (ref 39–117)
Anion gap: 11 (ref 5–15)
BUN: 14 mg/dL (ref 6–23)
CO2: 19 mEq/L (ref 19–32)
Calcium: 10 mg/dL (ref 8.4–10.5)
Chloride: 110 mEq/L (ref 96–112)
Creatinine, Ser: 1.11 mg/dL — ABNORMAL HIGH (ref 0.50–1.10)
GFR calc Af Amer: 52 mL/min — ABNORMAL LOW (ref >90)
GFR calc non Af Amer: 45 mL/min — ABNORMAL LOW (ref >90)
Glucose, Bld: 94 mg/dL (ref 70–99)
Potassium: 5.4 mEq/L — ABNORMAL HIGH (ref 3.7–5.3)
Sodium: 140 mEq/L (ref 137–147)
Total Bilirubin: 0.3 mg/dL (ref 0.3–1.2)
Total Protein: 7.5 g/dL (ref 6.0–8.3)

## 2013-12-02 LAB — CBC WITH DIFFERENTIAL/PLATELET
Basophils Absolute: 0 10*3/uL (ref 0.0–0.1)
Basophils Relative: 0 % (ref 0–1)
Eosinophils Absolute: 0.1 10*3/uL (ref 0.0–0.7)
Eosinophils Relative: 2 % (ref 0–5)
HCT: 37.4 % (ref 36.0–46.0)
Hemoglobin: 12.7 g/dL (ref 12.0–15.0)
Lymphocytes Relative: 22 % (ref 12–46)
Lymphs Abs: 1.3 10*3/uL (ref 0.7–4.0)
MCH: 29.2 pg (ref 26.0–34.0)
MCHC: 34 g/dL (ref 30.0–36.0)
MCV: 86 fL (ref 78.0–100.0)
Monocytes Absolute: 0.5 10*3/uL (ref 0.1–1.0)
Monocytes Relative: 9 % (ref 3–12)
Neutro Abs: 4 10*3/uL (ref 1.7–7.7)
Neutrophils Relative %: 67 % (ref 43–77)
Platelets: 308 10*3/uL (ref 150–400)
RBC: 4.35 MIL/uL (ref 3.87–5.11)
RDW: 13.8 % (ref 11.5–15.5)
WBC: 5.9 10*3/uL (ref 4.0–10.5)

## 2013-12-02 LAB — URINALYSIS, ROUTINE W REFLEX MICROSCOPIC
Bilirubin Urine: NEGATIVE
Glucose, UA: NEGATIVE mg/dL
Hgb urine dipstick: NEGATIVE
Ketones, ur: NEGATIVE mg/dL
Nitrite: NEGATIVE
Protein, ur: NEGATIVE mg/dL
Specific Gravity, Urine: 1.02 (ref 1.005–1.030)
Urobilinogen, UA: 0.2 mg/dL (ref 0.0–1.0)
pH: 5 (ref 5.0–8.0)

## 2013-12-02 LAB — URINE MICROSCOPIC-ADD ON

## 2013-12-02 LAB — POC OCCULT BLOOD, ED: Fecal Occult Bld: NEGATIVE

## 2013-12-02 IMAGING — CT CT ABD-PELV W/ CM
2 of 5 series · 16 of 46 positions shown, 18 images · IV contrast (Omnipaque 300)
Comparison: [DATE]

CLINICAL DATA: Abdominal pain is generalized. Rectal bleeding for 1
week

EXAM:
CT ABDOMEN AND PELVIS WITH CONTRAST
TECHNIQUE: Multidetector CT imaging of the abdomen and pelvis was performed
using the standard protocol following bolus administration of
intravenous contrast.
CONTRAST:  100mL OMNIPAQUE IOHEXOL 300 MG/ML  SOLN

[Series 2: abd_pel_with 5.0 b40f · axial · 0.64mm/px · z∈[-354,+10]mm · 13 of 83 slices shown, 15 images]
[im 5/83  soft-tissue]
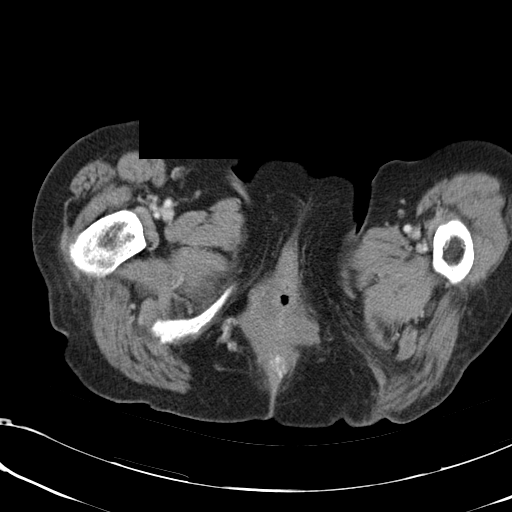
[im 5/83  bone]
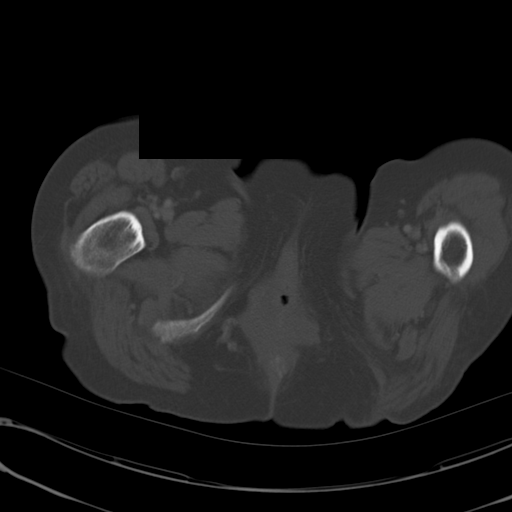
[im 10/83  soft-tissue]
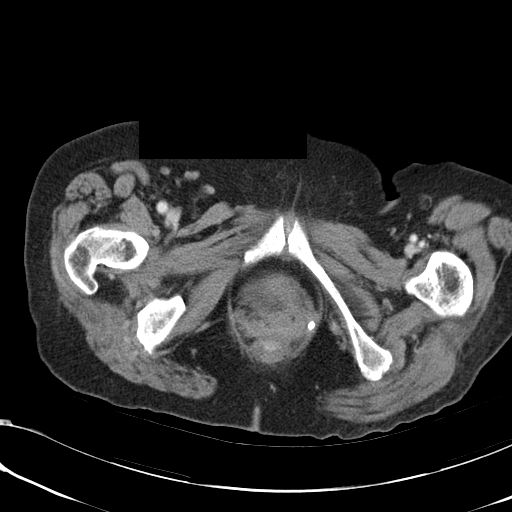
[im 20/83  soft-tissue]
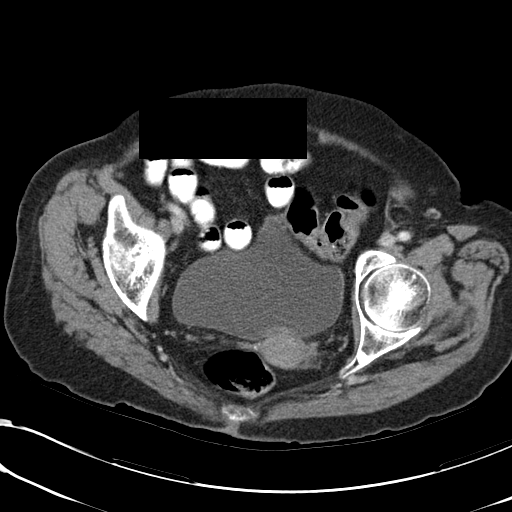
[im 25/83  soft-tissue]
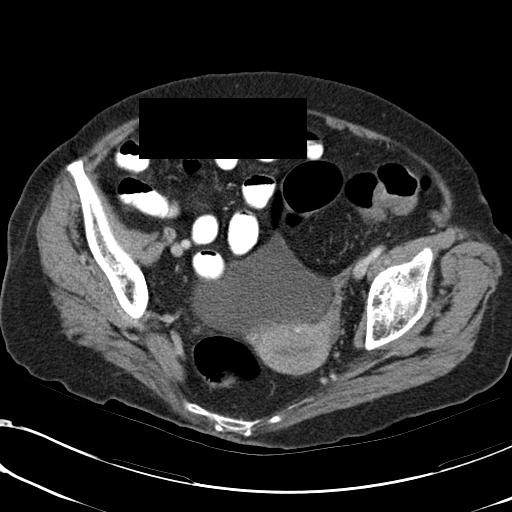
[im 29/83  soft-tissue]
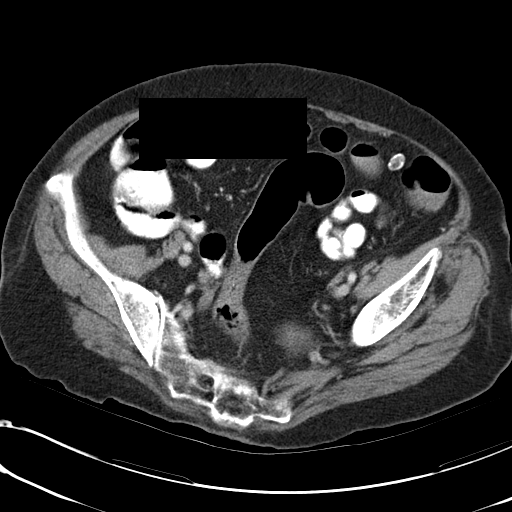
[im 34/83  soft-tissue]
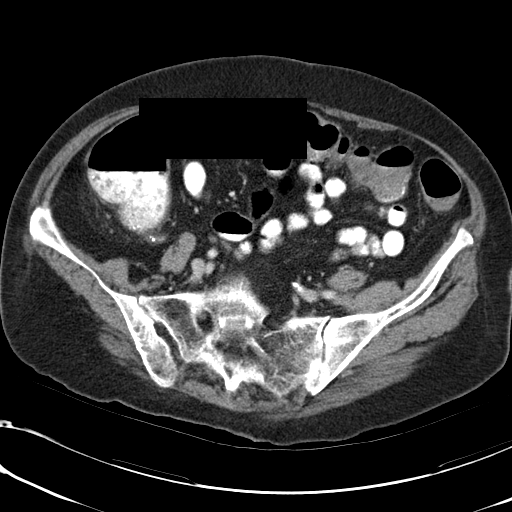
[im 44/83  soft-tissue]
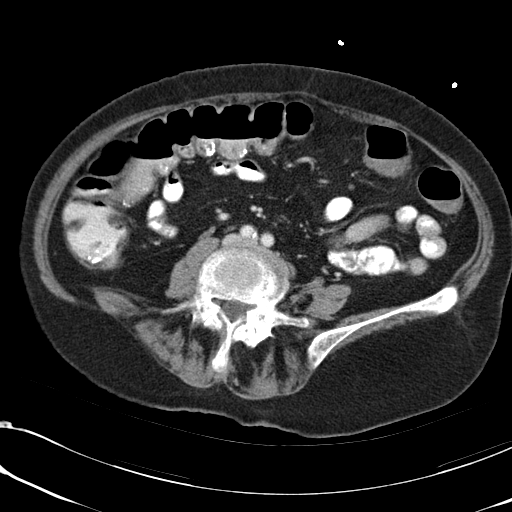
[im 49/83  soft-tissue]
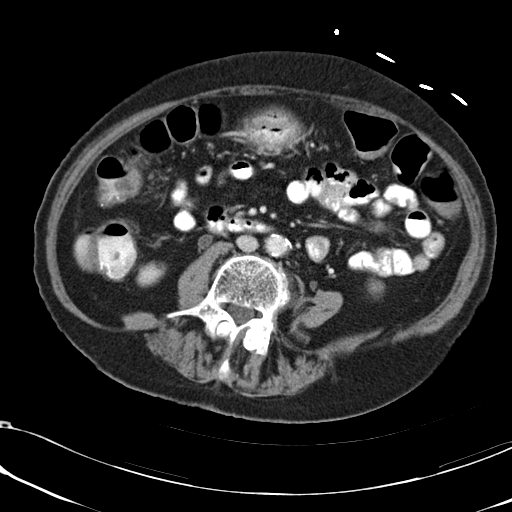
[im 54/83  soft-tissue]
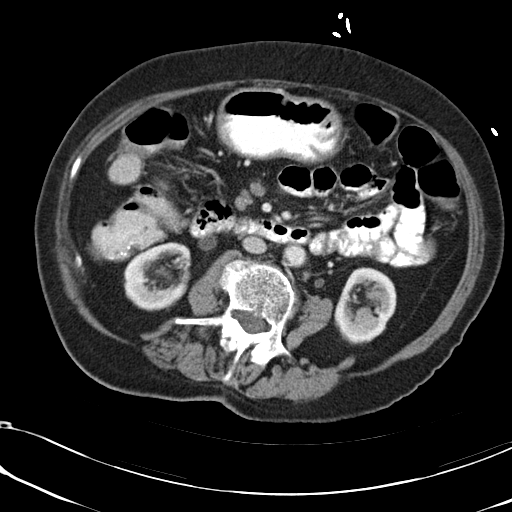
[im 54/83  bone]
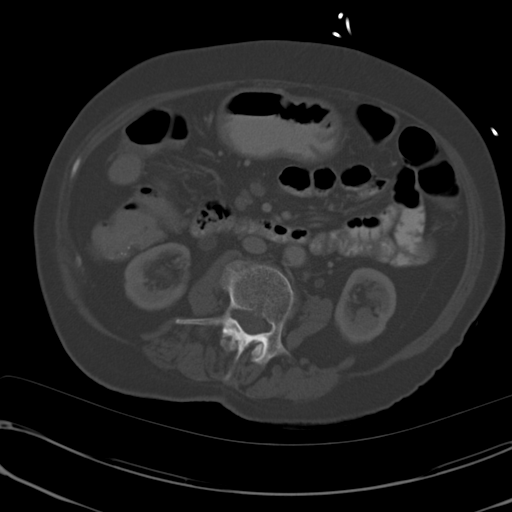
[im 58/83  soft-tissue]
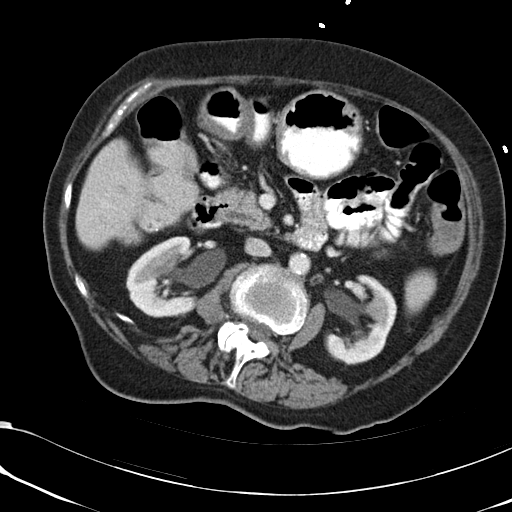
[im 63/83  soft-tissue]
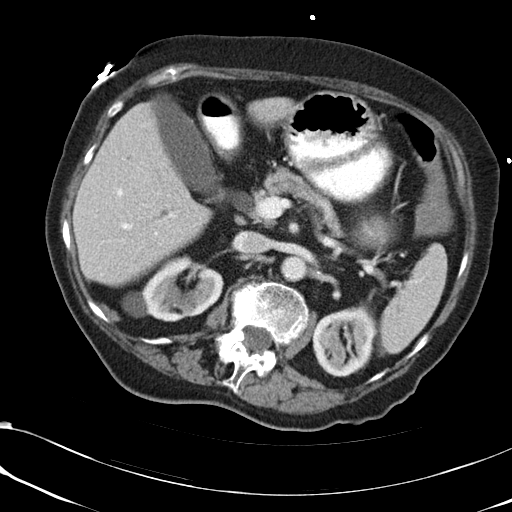
[im 73/83  soft-tissue]
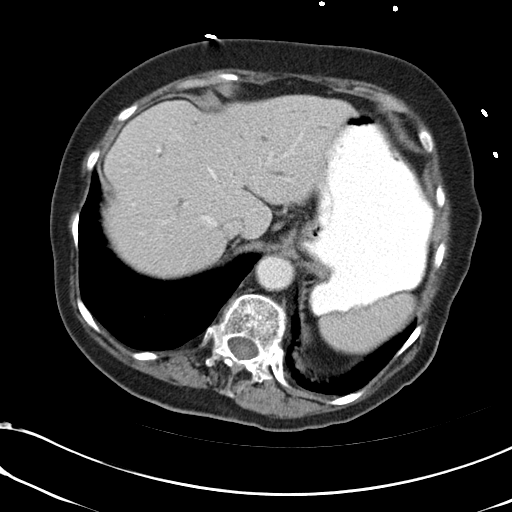
[im 78/83  soft-tissue]
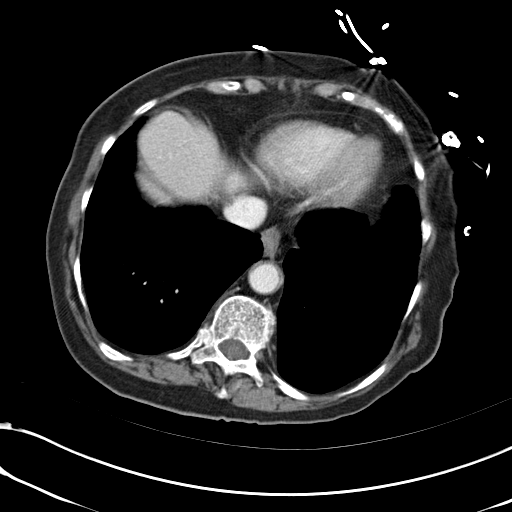

[Series 5: abd_pel_with 3.0 spo · coronal · 0.67mm/px · 3 of 85 slices shown]
[im 29/85  soft-tissue]
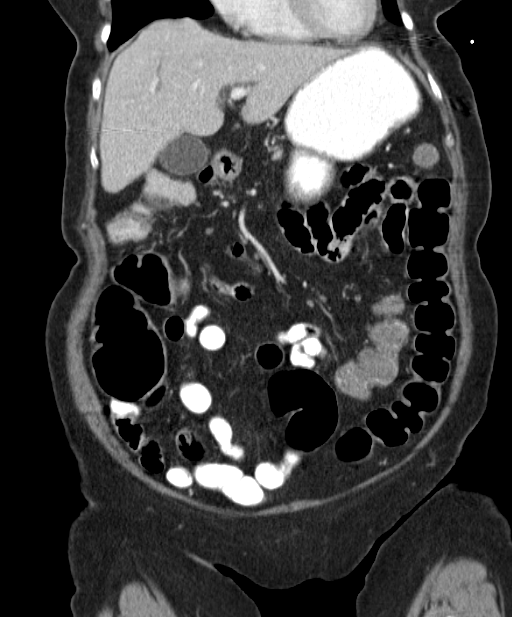
[im 38/85  soft-tissue]
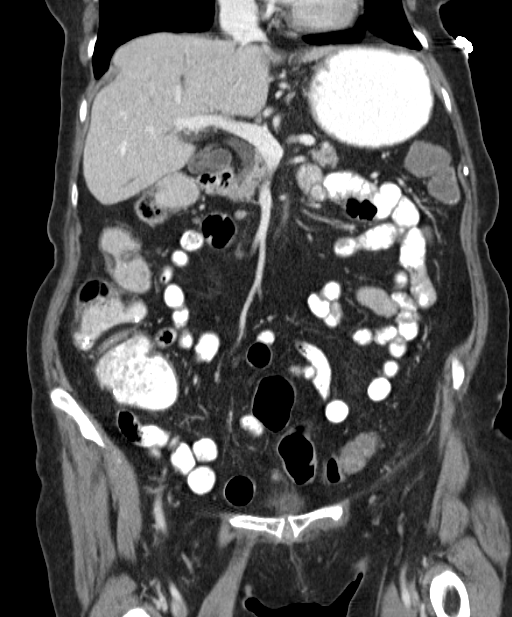
[im 47/85  soft-tissue]
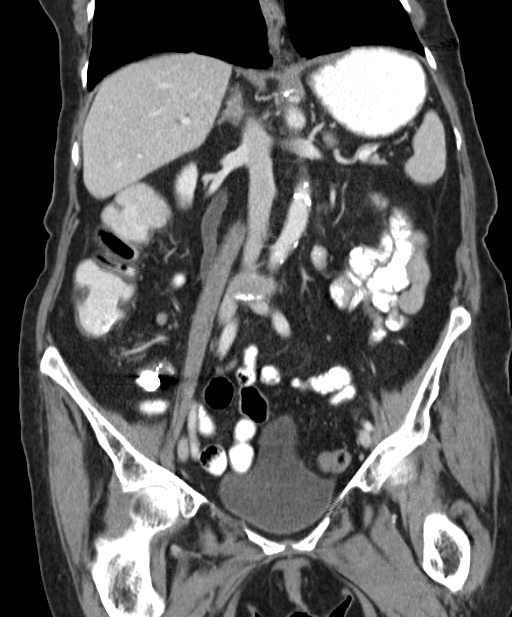

[16 of 46 positions shown; findings below may reference images not displayed]

FINDINGS: BODY WALL: Unremarkable.

LOWER CHEST: Peripheral centrilobular nodules at both bases are
chronic and likely from scarring or chronic mucoid impaction.

ABDOMEN/PELVIS:

Liver: No focal abnormality.

Biliary: No evidence of calcified gallstones or biliary
inflammation. Intrahepatic biliary ductal dilatation is slightly
more prominent than previous, but common bile duct enlargement is
stable at 9 mm, possibly age related.

Pancreas: Unremarkable.

Spleen: 2 hypervascular lesions within the inferior spleen, both 5
mm, are stable from priors and likely represent hemangioma or other
benign process.

Adrenals: Unremarkable.

Kidneys and ureters: 21 mm cyst from the interpolar right kidney.
There is punctate stone in the lower pole right kidney,
nonobstructive. Stable prominence of the upper right ureter. No
evidence for urinary obstruction.

Bladder: There is ongoing thickening involving the bladder base,
less prominent than prior secondary to increased distention.
Relative stability and location suggest post treatment changes,
possibly augmentation. No discrete enhancing bladder mass.

Reproductive: Unremarkable.

Bowel: Fluid levels in the distal colon suggesting diarrhea. No
bowel wall thickening. No bowel obstruction. No colonic
diverticulosis or other visible cause of rectal bleeding. Negative
appendix.

Retroperitoneum: No adenopathy or mass.

Peritoneum: No ascites or pneumoperitoneum.

Vascular: No acute abnormality.

OSSEOUS: Lumbar levoscoliosis with accelerated degenerative disc and
facet disease throughout the lumbar spine.
IMPRESSION: 1. No identifiable cause of abdominal pain.
2. Chronic findings are stable from [HE] and noted above.

## 2013-12-02 MED ORDER — IOHEXOL 300 MG/ML  SOLN
100.0000 mL | Freq: Once | INTRAMUSCULAR | Status: AC | PRN
  Administered 2013-12-02: 100 mL via INTRAVENOUS

## 2013-12-02 MED ORDER — SODIUM CHLORIDE 0.9 % IV SOLN
Freq: Once | INTRAVENOUS | Status: AC
  Administered 2013-12-02: 12:00:00 via INTRAVENOUS

## 2013-12-02 NOTE — ED Notes (Signed)
C/o rectal bleeding x 1 week. C/o generalized fatigue and abd pain.

## 2013-12-02 NOTE — ED Provider Notes (Signed)
CSN: [id number]     Arrival date & time 12/02/13  1053 History  This chart was scribed for Maudry Diego, MD by Lowella Petties, ED Scribe. The patient was seen in room APA19/APA19. Patient's care was started at 11:35 AM.    Chief Complaint  Patient presents with  . Rectal Bleeding   Patient is a 80 y.o. female presenting with hematochezia. The history is provided by the patient. No language interpreter was used.  Rectal Bleeding Quality:  Black and tarry Amount:  Moderate Duration:  1 week Timing:  Intermittent Progression:  Unchanged Chronicity:  Recurrent Context: diarrhea   Similar prior episodes: yes   Relieved by:  Nothing Worsened by:  Nothing tried Ineffective treatments:  None tried Associated symptoms: abdominal pain    HPI Comments: Wilda Wetherell is a 80 y.o. female who presents to the Emergency Department complaining of rectal bleeding which began one week ago. She reports that her stool became runny and yellow, then brown, and for the past week it has been black. She reports associated fatigue and abdominal pain. She states that she sees Dr. Clyda Greener for her gastric ulcers, and she has been admitted for a GI bleed in the past.   Past Medical History  Diagnosis Date  . Hypertension   . Hyperlipemia   . Gastric ulcer   . Skin cancer   . Chronic diarrhea   . Vertigo   . Sliding hiatal hernia   . Schatzki's ring   . Duodenal stricture   . GI bleed   . Microscopic colitis   . Acute blood loss anemia   . Dilation of biliary tract     biliary duct per 02/26/13 CT  . Pancreatic duct dilated 02/26/13  . Nephrolithiasis   . Esophageal stricture   . Diabetes    Past Surgical History  Procedure Laterality Date  . Nose surgery      for skin cancer  . Esophagogastroduodenoscopy N/A 02/27/2013    Procedure: ESOPHAGOGASTRODUODENOSCOPY (EGD);  Surgeon: Ladene Artist, MD;  Location: [facility] ENDOSCOPY;  Service: Endoscopy;  Laterality: N/A;  . Hot hemostasis N/A 02/27/2013     Procedure: HOT HEMOSTASIS (ARGON PLASMA COAGULATION/BICAP);  Surgeon: Ladene Artist, MD;  Location: [facility] ENDOSCOPY;  Service: Endoscopy;  Laterality: N/A;  . Esophagogastroduodenoscopy N/A 03/28/2013    Procedure: ESOPHAGOGASTRODUODENOSCOPY (EGD);  Surgeon: Rogene Houston, MD;  Location: [facility] ENDO SUITE;  Service: Endoscopy;  Laterality: N/A;   Family History  Problem Relation Age of Onset  . Heart attack Father    History  Substance Use Topics  . Smoking status: Never Smoker   . Smokeless tobacco: Never Used  . Alcohol Use: No   OB History   Grav Para Term Preterm Abortions TAB SAB Ect Mult Living   6 6 6       6      Review of Systems  Constitutional: Positive for fatigue. Negative for appetite change.  HENT: Negative for congestion, ear discharge and sinus pressure.   Eyes: Negative for discharge.  Respiratory: Negative for cough.   Cardiovascular: Negative for chest pain.  Gastrointestinal: Positive for abdominal pain, hematochezia and anal bleeding. Negative for diarrhea.  Genitourinary: Negative for frequency and hematuria.  Musculoskeletal: Negative for back pain.  Skin: Negative for rash.  Neurological: Negative for seizures and headaches.  Psychiatric/Behavioral: Negative for hallucinations.   Allergies  Codeine and Sulfa antibiotics  Home Medications   Prior to Admission medications   Medication Sig Start Date End Date  Taking? Authorizing Provider  aspirin 81 MG chewable tablet Chew 1 tablet (81 mg total) by mouth daily. 06/09/13   Kathie Dike, MD  atenolol (TENORMIN) 50 MG tablet Take 50 mg by mouth daily.     Historical Provider, MD  cetirizine (ZYRTEC) 10 MG tablet Take 10 mg by mouth daily.    Historical Provider, MD  furosemide (LASIX) 20 MG tablet Take 1 tablet (20 mg total) by mouth every other day. 11/21/13   Jasper Riling. Pickering, MD  lisinopril (PRINIVIL,ZESTRIL) 40 MG tablet Take 40 mg by mouth daily.    Historical Provider, MD  loperamide (IMODIUM A-D) 2  MG tablet Take 2 mg by mouth every 3 (three) hours.     Historical Provider, MD  lovastatin (MEVACOR) 20 MG tablet Take 20 mg by mouth at bedtime.    Historical Provider, MD  pantoprazole (PROTONIX) 40 MG tablet Take 40 mg by mouth daily.    Historical Provider, MD  potassium chloride (K-DUR,KLOR-CON) 10 MEQ tablet Take 10 mEq by mouth 4 (four) times daily.  03/10/13   Historical Provider, MD   Triage Vitals: BP 165/70  Pulse 64  Temp(Src) 97.5 F (36.4 C) (Oral)  Resp 16  SpO2 100% Physical Exam  Nursing note and vitals reviewed. Constitutional: She is oriented to person, place, and time. She appears well-developed.  HENT:  Head: Normocephalic.  Dry mucus membranes.  Eyes: Conjunctivae and EOM are normal. No scleral icterus.  Neck: Neck supple. No thyromegaly present.  Cardiovascular: Normal rate and regular rhythm.  Exam reveals no gallop and no friction rub.   No murmur heard. Pulmonary/Chest: No stridor. She has no wheezes. She has no rales. She exhibits no tenderness.  Abdominal: She exhibits no distension. There is tenderness (minimal LLQ and RLQ ). There is no rebound.  Genitourinary:  Rectal exam was normal and heme negative.  Musculoskeletal: Normal range of motion. She exhibits no edema.  Lymphadenopathy:    She has no cervical adenopathy.  Neurological: She is oriented to person, place, and time. She exhibits normal muscle tone. Coordination normal.  Skin: No rash noted. No erythema.  Psychiatric: She has a normal mood and affect. Her behavior is normal.    ED Course  Procedures (including critical care time) DIAGNOSTIC STUDIES: Oxygen Saturation is 100% on room air, normal by my interpretation.    COORDINATION OF CARE: 11:42 AM-Discussed treatment plan with pt at bedside and pt agreed to plan.   Labs Review Labs Reviewed  CBC WITH DIFFERENTIAL  COMPREHENSIVE METABOLIC PANEL    Imaging Review No results found.   EKG Interpretation None    Dr. Wonda Amis  will see pt next week.      Pt did say she used pepto bismol recently  MDM   Final diagnoses:  None   Black stools,   Nl studies,  Possibly from pepto buknik     Maudry Diego, MD 12/02/13 1524

## 2013-12-02 NOTE — Telephone Encounter (Signed)
Renee Blake called to cancel her mother's apt for today. She is on the way to the ED with Renee Blake. Renee Blake is bleeding from the rectum with a foul smell. Renee Blake's return phone number is [phone].

## 2013-12-02 NOTE — Telephone Encounter (Signed)
Noted  

## 2013-12-02 NOTE — ED Notes (Signed)
Paged Dr. Laural Golden to [phone]

## 2013-12-02 NOTE — Discharge Instructions (Signed)
Follow up with dr. Wonda Amis next week.   Return if needed

## 2013-12-07 ENCOUNTER — Encounter: Payer: Self-pay | Admitting: *Deleted

## 2013-12-07 ENCOUNTER — Encounter: Payer: Self-pay | Admitting: Cardiology

## 2013-12-07 NOTE — Progress Notes (Signed)
ERROR

## 2014-01-31 ENCOUNTER — Encounter (HOSPITAL_COMMUNITY): Payer: Self-pay | Admitting: Emergency Medicine

## 2014-02-01 ENCOUNTER — Ambulatory Visit (INDEPENDENT_AMBULATORY_CARE_PROVIDER_SITE_OTHER): Payer: Medicare Other | Admitting: Internal Medicine

## 2014-02-01 ENCOUNTER — Encounter (INDEPENDENT_AMBULATORY_CARE_PROVIDER_SITE_OTHER): Payer: Self-pay | Admitting: Internal Medicine

## 2014-02-01 VITALS — BP 110/68 | HR 76 | Temp 97.0°F | Resp 16 | Ht 66.0 in | Wt 115.5 lb

## 2014-02-01 DIAGNOSIS — K529 Noninfective gastroenteritis and colitis, unspecified: Secondary | ICD-10-CM

## 2014-02-01 DIAGNOSIS — Z8719 Personal history of other diseases of the digestive system: Secondary | ICD-10-CM

## 2014-02-01 NOTE — Patient Instructions (Signed)
Call if diarrhea recurs

## 2014-02-01 NOTE — Progress Notes (Addendum)
Presenting complaint;  Follow for diarrhea and peptic ulcer disease.  Subjective:  Patient is a 80 year old Caucasian female who presents for scheduled visit accompanied by her daughter. She has history of diarrhea for which he was evaluated last year by Dr. Britta Mccreedy of [place], [place].colonoscopy and biopsies are unremarkable. Patient states she's been doing well with Imodium but about 2 months ago she began to have 4-5 stools per day. She did not experience abdominal pain, nausea vomiting or rectal bleeding. She was begun on cholestyramine by Dr. Delphina Cahill. Now she is having formed stools daily or every other day. She is not having any side effects with cholestyramine. She does not recall antibiotic therapy prior to onset of her diarrhea. She did receive a course of antibiotic therapy a few weeks ago for urinary tract infection. Her appetite is fair. She denies epigastric pain heartburn dysphagia or melena.   Current Medications: Outpatient Encounter Prescriptions as of 02/01/2014  Medication Sig  . amLODipine (NORVASC) 5 MG tablet Take 5 mg by mouth daily.  Marland Kitchen aspirin EC 81 MG tablet Take 81 mg by mouth daily.  Marland Kitchen atenolol (TENORMIN) 50 MG tablet Take 50 mg by mouth 2 (two) times daily.   . cholestyramine (QUESTRAN) 4 GM/DOSE powder Take 4 g by mouth 2 (two) times daily with a meal.   . lisinopril (PRINIVIL,ZESTRIL) 40 MG tablet Take 40 mg by mouth daily.  Marland Kitchen loperamide (IMODIUM) 2 MG capsule Take 2 mg by mouth daily.   Marland Kitchen lovastatin (MEVACOR) 20 MG tablet Take 20 mg by mouth at bedtime.  . pantoprazole (PROTONIX) 40 MG tablet Take 40 mg by mouth 2 (two) times daily.   Marland Kitchen spironolactone (ALDACTONE) 25 MG tablet Take 25 mg by mouth. Patient is taking 1/2 tablet by mouth in the morning and 1/2 by mouth in the evening.  . traMADol (ULTRAM) 50 MG tablet Take 50 mg by mouth as needed.   . potassium chloride (K-DUR,KLOR-CON) 10 MEQ tablet Take 10-20 mEq by mouth 2 (two) times daily.      Objective: Blood pressure 110/68, pulse 76, temperature 97 F (36.1 C), temperature source Oral, resp. rate 16, height 5\' 6"  (1.676 m), weight 115 lb 8 oz (52.39 kg). Patient is alert and in no acute distress. Conjunctiva is pink. Sclera is nonicteric Oropharyngeal mucosa is normal. No neck masses or thyromegaly noted. Cardiac exam with regular rhythm normal S1 and S2. No murmur or gallop noted. Lungs are clear to auscultation. Abdomen is symmetrical. Bowel sounds are normal. On palpation abdomen is soft and non-tender without organomegaly or masses. No LE edema or clubbing noted.  Labs/studies Results: Lab data from 12/02/2013 H&H 12.7 and 37.4 WBC 5.9 and platelet count 308K Bilirubin 0.3, AP 69, AST 20, ALT 19, total protein 7.5, albumin 4.0 and calcium 10.0 Creatinine 1.11 and BUN 14.    Assessment:  #1. Chronic/intermittent diarrhea. She has responded to cholestyramine. She does not have alarm symptoms. Colonic biopsy last year was negative for collagenous or microscopic colitis. #2. History of upper GI bleed secondary to duodenal ulcer last year. Initial EGD was 02/27/2013 while she was hospitalized at [facility]. Follow-up EGD was on 03/28/2013 revealing almost complete healing of ulcer. She remains on PPI for prophylaxis.  Plan:  Discontinue loperamide. Patient advised not to take  laxative if she gets constipated but she can use Dulcolax suppository or Fleet enema.  She can drop cholestyramine dose if she becomes constipated. She can drop dose 2 g by mouth twice a  day or she can take 4 g in a.m and 2 g and the afternoon. Patient advised to take other medications at least twice before after taking cholestyramine. Office visit in 1 year unless symptoms relapse or she has weight loss.

## 2014-03-15 ENCOUNTER — Encounter (HOSPITAL_COMMUNITY): Payer: Self-pay | Admitting: *Deleted

## 2014-03-15 ENCOUNTER — Emergency Department (HOSPITAL_COMMUNITY): Payer: Medicare Other

## 2014-03-15 ENCOUNTER — Emergency Department (HOSPITAL_COMMUNITY)
Admission: EM | Admit: 2014-03-15 | Discharge: 2014-03-15 | Disposition: A | Discharge status: Home / Self Care | Payer: Medicare Other | Attending: Emergency Medicine | Admitting: Emergency Medicine

## 2014-03-15 DIAGNOSIS — Z7982 Long term (current) use of aspirin: Secondary | ICD-10-CM | POA: Diagnosis not present

## 2014-03-15 DIAGNOSIS — R0602 Shortness of breath: Secondary | ICD-10-CM | POA: Insufficient documentation

## 2014-03-15 DIAGNOSIS — Z79899 Other long term (current) drug therapy: Secondary | ICD-10-CM | POA: Insufficient documentation

## 2014-03-15 DIAGNOSIS — I10 Essential (primary) hypertension: Secondary | ICD-10-CM | POA: Diagnosis not present

## 2014-03-15 DIAGNOSIS — E119 Type 2 diabetes mellitus without complications: Secondary | ICD-10-CM | POA: Insufficient documentation

## 2014-03-15 DIAGNOSIS — R609 Edema, unspecified: Secondary | ICD-10-CM

## 2014-03-15 DIAGNOSIS — E785 Hyperlipidemia, unspecified: Secondary | ICD-10-CM | POA: Insufficient documentation

## 2014-03-15 DIAGNOSIS — R600 Localized edema: Secondary | ICD-10-CM | POA: Diagnosis not present

## 2014-03-15 DIAGNOSIS — Z862 Personal history of diseases of the blood and blood-forming organs and certain disorders involving the immune mechanism: Secondary | ICD-10-CM | POA: Insufficient documentation

## 2014-03-15 DIAGNOSIS — Z87442 Personal history of urinary calculi: Secondary | ICD-10-CM | POA: Insufficient documentation

## 2014-03-15 DIAGNOSIS — Z85828 Personal history of other malignant neoplasm of skin: Secondary | ICD-10-CM | POA: Diagnosis not present

## 2014-03-15 DIAGNOSIS — R0600 Dyspnea, unspecified: Secondary | ICD-10-CM | POA: Diagnosis present

## 2014-03-15 DIAGNOSIS — R2241 Localized swelling, mass and lump, right lower limb: Secondary | ICD-10-CM | POA: Diagnosis present

## 2014-03-15 DIAGNOSIS — Q394 Esophageal web: Secondary | ICD-10-CM | POA: Insufficient documentation

## 2014-03-15 DIAGNOSIS — K259 Gastric ulcer, unspecified as acute or chronic, without hemorrhage or perforation: Secondary | ICD-10-CM | POA: Insufficient documentation

## 2014-03-15 LAB — CBC WITH DIFFERENTIAL/PLATELET
Basophils Absolute: 0 10*3/uL (ref 0.0–0.1)
Basophils Relative: 0 % (ref 0–1)
Eosinophils Absolute: 0.1 10*3/uL (ref 0.0–0.7)
Eosinophils Relative: 2 % (ref 0–5)
HCT: 37 % (ref 36.0–46.0)
Hemoglobin: 12 g/dL (ref 12.0–15.0)
Lymphocytes Relative: 27 % (ref 12–46)
Lymphs Abs: 1.7 10*3/uL (ref 0.7–4.0)
MCH: 29.7 pg (ref 26.0–34.0)
MCHC: 32.4 g/dL (ref 30.0–36.0)
MCV: 91.6 fL (ref 78.0–100.0)
Monocytes Absolute: 0.5 10*3/uL (ref 0.1–1.0)
Monocytes Relative: 8 % (ref 3–12)
Neutro Abs: 3.9 10*3/uL (ref 1.7–7.7)
Neutrophils Relative %: 63 % (ref 43–77)
Platelets: 255 10*3/uL (ref 150–400)
RBC: 4.04 MIL/uL (ref 3.87–5.11)
RDW: 13.7 % (ref 11.5–15.5)
WBC: 6.2 10*3/uL (ref 4.0–10.5)

## 2014-03-15 LAB — BASIC METABOLIC PANEL
Anion gap: 15 (ref 5–15)
BUN: 7 mg/dL (ref 6–23)
CO2: 23 mEq/L (ref 19–32)
Calcium: 9.5 mg/dL (ref 8.4–10.5)
Chloride: 104 mEq/L (ref 96–112)
Creatinine, Ser: 0.79 mg/dL (ref 0.50–1.10)
GFR calc Af Amer: 87 mL/min — ABNORMAL LOW (ref >90)
GFR calc non Af Amer: 75 mL/min — ABNORMAL LOW (ref >90)
Glucose, Bld: 92 mg/dL (ref 70–99)
Potassium: 3.4 mEq/L — ABNORMAL LOW (ref 3.7–5.3)
Sodium: 142 mEq/L (ref 137–147)

## 2014-03-15 LAB — PRO B NATRIURETIC PEPTIDE: Pro B Natriuretic peptide (BNP): 1121 pg/mL — ABNORMAL HIGH (ref 0–450)

## 2014-03-15 IMAGING — CR DG CHEST 2V
2 series · 2 of 2 positions shown · non-contrast
Comparison: [DATE]

CLINICAL DATA: Dyspnea and lower extremity edema 2 weeks

EXAM:
CHEST  2 VIEW

[view not recorded (1 of 2)]
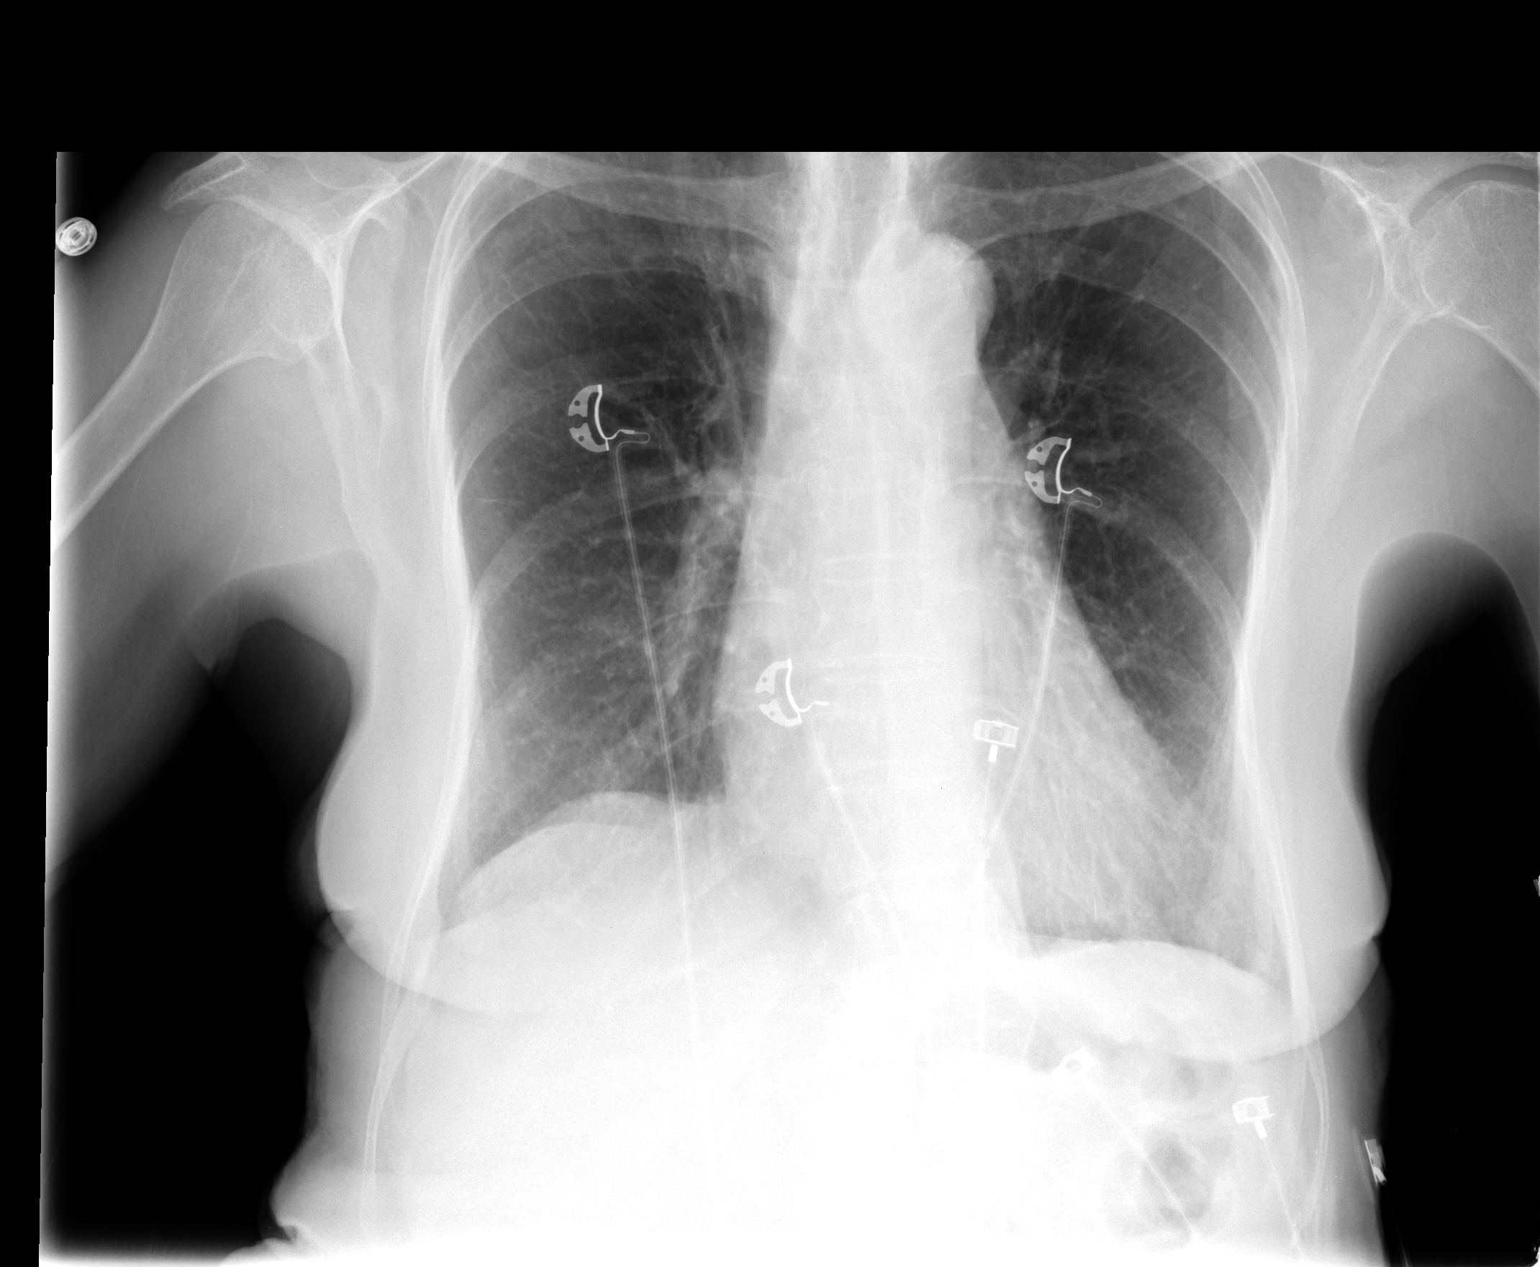

[view not recorded (2 of 2)]
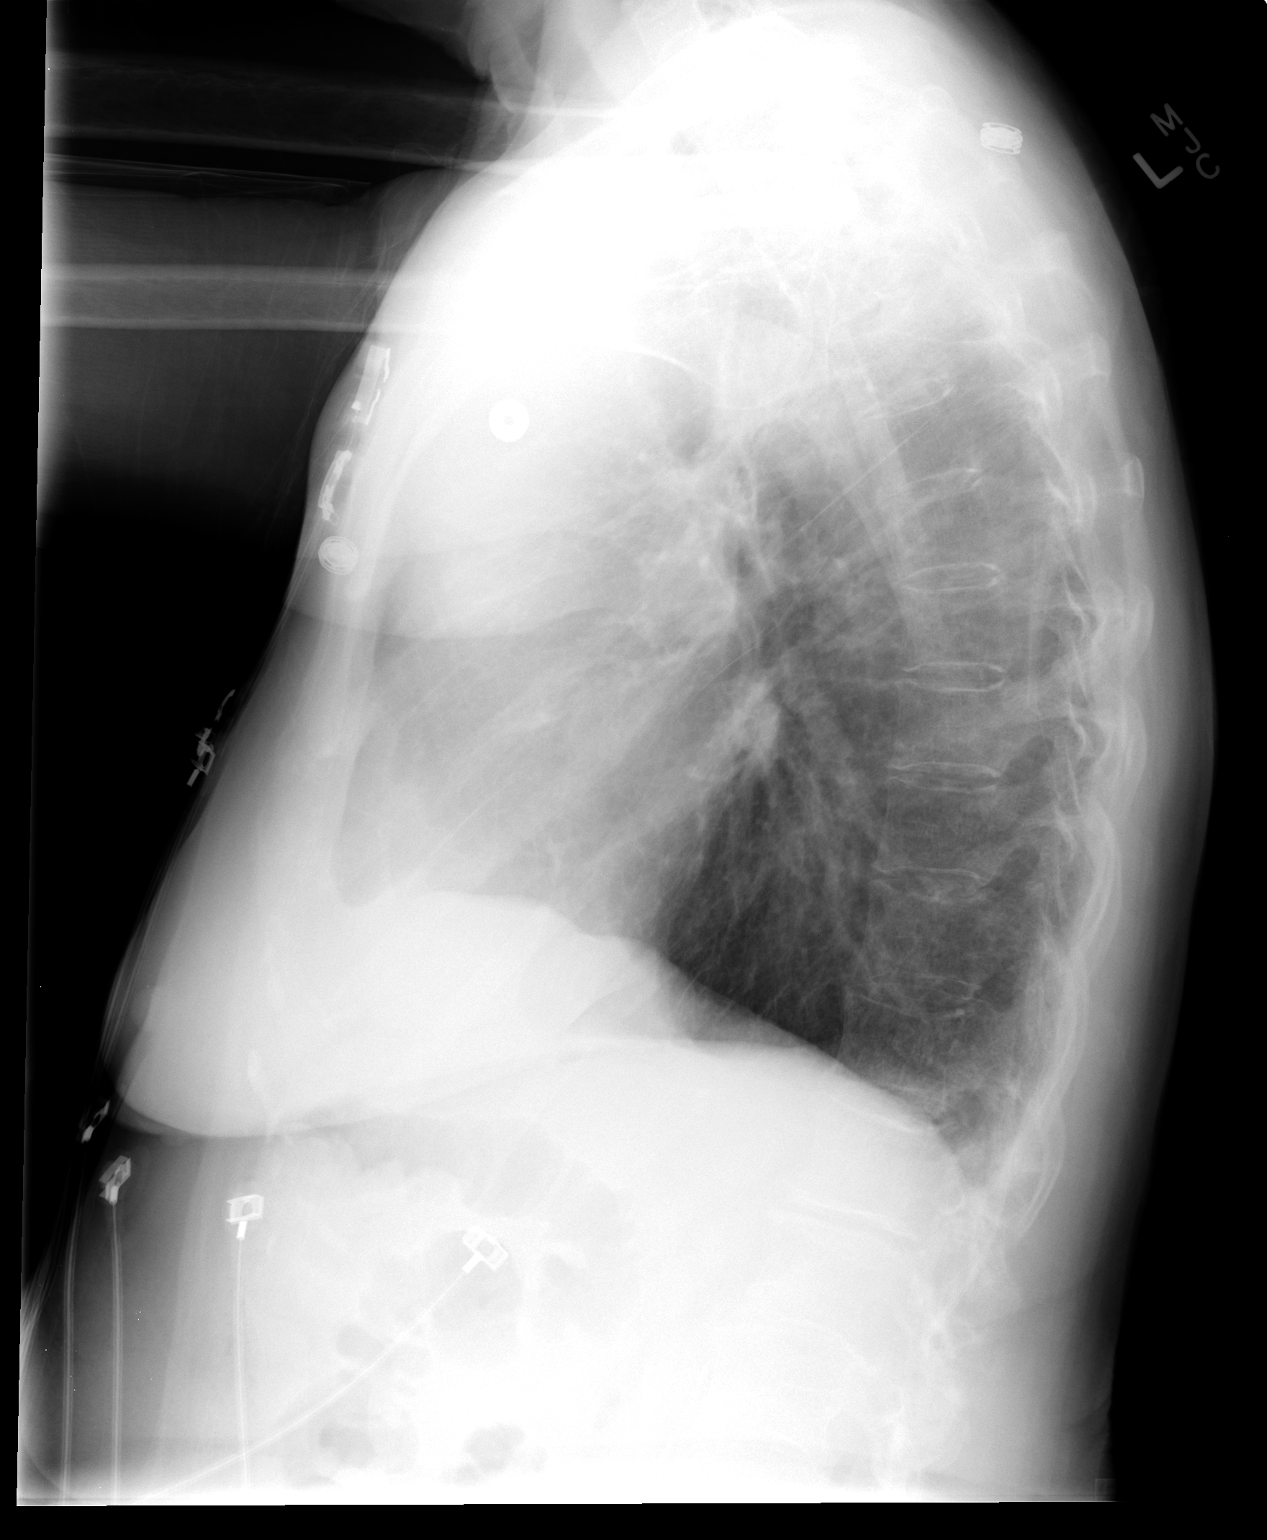

[2 of 2 positions shown; findings below may reference images not displayed]

FINDINGS: There is no edema or consolidation. Heart size and pulmonary
vascularity are within normal limits. No adenopathy. There is
thoracolumbar levoscoliosis.
IMPRESSION: No edema or consolidation.

## 2014-03-15 MED ORDER — TRAMADOL HCL 50 MG PO TABS
50.0000 mg | ORAL_TABLET | Freq: Once | ORAL | Status: AC
  Administered 2014-03-15: 50 mg via ORAL
  Filled 2014-03-15: qty 1

## 2014-03-15 MED ORDER — POTASSIUM CHLORIDE CRYS ER 20 MEQ PO TBCR
60.0000 meq | EXTENDED_RELEASE_TABLET | Freq: Once | ORAL | Status: AC
  Administered 2014-03-15: 60 meq via ORAL
  Filled 2014-03-15: qty 3

## 2014-03-15 MED ORDER — FUROSEMIDE 40 MG PO TABS
40.0000 mg | ORAL_TABLET | Freq: Once | ORAL | Status: AC
  Administered 2014-03-15: 40 mg via ORAL
  Filled 2014-03-15: qty 1

## 2014-03-15 NOTE — ED Notes (Signed)
Pt comes in with bilateral swelling in her feet. Pt states this started around 3 weeks ago. Her PCP put her on lasix but that has yielded no relief. Pt states she has hx of CHF. Pain is localized to her head. Denies SOB but states she has been dizzy.   2+ edema is noted in both extremities. Dorsalis pedis pulse is palpated and cap refill <3 seconds.

## 2014-03-15 NOTE — ED Notes (Signed)
Nurse made attempt to get blood x2. Lab called about getting blood.

## 2014-03-15 NOTE — ED Notes (Signed)
MD at bedside. 

## 2014-03-15 NOTE — Discharge Instructions (Signed)
Increase your lasix to 20mg  twice per day for the next 3 days then resume previous dosing.   Edema Edema is an abnormal buildup of fluids in your bodytissues. Edema is somewhatdependent on gravity to pull the fluid to the lowest place in your body. That makes the condition more common in the legs and thighs (lower extremities). Painless swelling of the feet and ankles is common and becomes more likely as you get older. It is also common in looser tissues, like around your eyes.  When the affected area is squeezed, the fluid may move out of that spot and leave a dent for a few moments. This dent is called pitting.  CAUSES  There are many possible causes of edema. Eating too much salt and being on your feet or sitting for a long time can cause edema in your legs and ankles. Hot weather may make edema worse. Common medical causes of edema include:  Heart failure.  Liver disease.  Kidney disease.  Weak blood vessels in your legs.  Cancer.  An injury.  Pregnancy.  Some medications.  Obesity. SYMPTOMS  Edema is usually painless.Your skin may look swollen or shiny.  DIAGNOSIS  Your health care provider may be able to diagnose edema by asking about your medical history and doing a physical exam. You may need to have tests such as X-rays, an electrocardiogram, or blood tests to check for medical conditions that may cause edema.  TREATMENT  Edema treatment depends on the cause. If you have heart, liver, or kidney disease, you need the treatment appropriate for these conditions. General treatment may include:  Elevation of the affected body part above the level of your heart.  Compression of the affected body part. Pressure from elastic bandages or support stockings squeezes the tissues and forces fluid back into the blood vessels. This keeps fluid from entering the tissues.  Restriction of fluid and salt intake.  Use of a water pill (diuretic). These medications are appropriate only  for some types of edema. They pull fluid out of your body and make you urinate more often. This gets rid of fluid and reduces swelling, but diuretics can have side effects. Only use diuretics as directed by your health care provider. HOME CARE INSTRUCTIONS   Keep the affected body part above the level of your heart when you are lying down.   Do not sit still or stand for prolonged periods.   Do not put anything directly under your knees when lying down.  Do not wear constricting clothing or garters on your upper legs.   Exercise your legs to work the fluid back into your blood vessels. This may help the swelling go down.   Wear elastic bandages or support stockings to reduce ankle swelling as directed by your health care provider.   Eat a low-salt diet to reduce fluid if your health care provider recommends it.   Only take medicines as directed by your health care provider. SEEK MEDICAL CARE IF:   Your edema is not responding to treatment.  You have heart, liver, or kidney disease and notice symptoms of edema.  You have edema in your legs that does not improve after elevating them.   You have sudden and unexplained weight gain. SEEK IMMEDIATE MEDICAL CARE IF:   You develop shortness of breath or chest pain.   You cannot breathe when you lie down.  You develop pain, redness, or warmth in the swollen areas.   You have heart, liver, or kidney  disease and suddenly get edema.  You have a fever and your symptoms suddenly get worse. MAKE SURE YOU:   Understand these instructions.  Will watch your condition.  Will get help right away if you are not doing well or get worse. Document Released: 03/18/2005 Document Revised: 08/02/2013 Document Reviewed: 01/08/2013 [facility] Patient Information 2015 [place], [place]. This information is not intended to replace advice given to you by your health care provider. Make sure you discuss any questions you have with your health care  provider.

## 2014-03-15 NOTE — ED Provider Notes (Signed)
CSN: [id number]     Arrival date & time 03/15/14  1622 History  This chart was scribed for Virgel Manifold, MD by Lowella Petties, ED Scribe. The patient was seen in room APA08/APA08. Patient's care was started at 5:29 PM.     Chief Complaint  Patient presents with  . Foot Swelling   The history is provided by the patient. No language interpreter was used.   HPI Comments: Renee Blake is a 80 y.o. female who presents to the Emergency Department complaining of swelling in her feet bilaterally for the past two weeks and acutely worsened today. She reports associated difficulty breathing, urinary retention, headache, and nausea for the past week. She states that the last time she had these symptoms it involved her lungs and heart. She reports seeing a doctor in [place] for these symptoms in the past. She reports starting to take Lasix again 3 weeks ago. She denies abdominal pain.    Past Medical History  Diagnosis Date  . Hypertension   . Hyperlipemia   . Gastric ulcer   . Chronic diarrhea   . Vertigo   . Sliding hiatal hernia   . Schatzki's ring   . Duodenal stricture   . GI bleed   . Microscopic colitis   . Acute blood loss anemia   . Dilation of biliary tract     biliary duct per 02/26/13 CT  . Pancreatic duct dilated 02/26/13  . Esophageal stricture   . Diabetes   . Skin cancer   . Nephrolithiasis    Past Surgical History  Procedure Laterality Date  . Nose surgery      for skin cancer  . Esophagogastroduodenoscopy N/A 02/27/2013    Procedure: ESOPHAGOGASTRODUODENOSCOPY (EGD);  Surgeon: Ladene Artist, MD;  Location: [facility] ENDOSCOPY;  Service: Endoscopy;  Laterality: N/A;  . Hot hemostasis N/A 02/27/2013    Procedure: HOT HEMOSTASIS (ARGON PLASMA COAGULATION/BICAP);  Surgeon: Ladene Artist, MD;  Location: [facility] ENDOSCOPY;  Service: Endoscopy;  Laterality: N/A;  . Esophagogastroduodenoscopy N/A 03/28/2013    Procedure: ESOPHAGOGASTRODUODENOSCOPY (EGD);  Surgeon: Rogene Houston, MD;  Location: [facility] ENDO SUITE;  Service: Endoscopy;  Laterality: N/A;   Family History  Problem Relation Age of Onset  . Heart attack Father    History  Substance Use Topics  . Smoking status: Never Smoker   . Smokeless tobacco: Never Used  . Alcohol Use: No   OB History    Gravida Para Term Preterm AB TAB SAB Ectopic Multiple Living   6 6 6       6      Review of Systems  Respiratory: Positive for shortness of breath.   Cardiovascular: Positive for leg swelling (feet bilaterally).  Gastrointestinal: Negative for abdominal pain.  All other systems reviewed and are negative.   Allergies  Codeine and Sulfa antibiotics  Home Medications   Prior to Admission medications   Medication Sig Start Date End Date Taking? Authorizing Provider  amLODipine (NORVASC) 5 MG tablet Take 5 mg by mouth daily.    Historical Provider, MD  aspirin EC 81 MG tablet Take 81 mg by mouth daily.    Historical Provider, MD  atenolol (TENORMIN) 50 MG tablet Take 50 mg by mouth 2 (two) times daily.     Historical Provider, MD  cholestyramine Lucrezia Starch) 4 GM/DOSE powder Take 4 g by mouth 2 (two) times daily with a meal.  01/12/14   Historical Provider, MD  lisinopril (PRINIVIL,ZESTRIL) 40 MG tablet Take 40 mg by  mouth daily.    Historical Provider, MD  lovastatin (MEVACOR) 20 MG tablet Take 20 mg by mouth at bedtime.    Historical Provider, MD  pantoprazole (PROTONIX) 40 MG tablet Take 40 mg by mouth 2 (two) times daily.     Historical Provider, MD  potassium chloride (K-DUR,KLOR-CON) 10 MEQ tablet Take 10-20 mEq by mouth 2 (two) times daily.  03/10/13   Historical Provider, MD  spironolactone (ALDACTONE) 25 MG tablet Take 25 mg by mouth. Patient is taking 1/2 tablet by mouth in the morning and 1/2 by mouth in the evening. 01/26/14   Historical Provider, MD  traMADol (ULTRAM) 50 MG tablet Take 50 mg by mouth as needed.  01/07/14   Historical Provider, MD   Triage Vitals: BP 157/71 mmHg  Pulse 57   Temp(Src) 97.9 F (36.6 C) (Oral)  Resp 20  Ht 5\' 6"  (1.676 m)  Wt 111 lb (50.349 kg)  BMI 17.92 kg/m2  SpO2 100% Physical Exam  Constitutional: She is oriented to person, place, and time. She appears well-developed and well-nourished. No distress.  HENT:  Head: Normocephalic and atraumatic.  Eyes: Conjunctivae and EOM are normal. Pupils are equal, round, and reactive to light.  Neck: Normal range of motion.  Cardiovascular: Normal rate, regular rhythm and normal heart sounds.   No murmur heard. Pulmonary/Chest: Effort normal and breath sounds normal. No respiratory distress. She has no wheezes. She has no rales.  Abdominal: Soft. She exhibits no distension. There is no tenderness.  Musculoskeletal: Normal range of motion. She exhibits edema. She exhibits no tenderness.  Mild symmetric pitting lower extremity edema  Neurological: She is alert and oriented to person, place, and time.  Skin: Skin is warm and dry.  Psychiatric: She has a normal mood and affect. Judgment normal.  Nursing note and vitals reviewed.   ED Course  Procedures (including critical care time) DIAGNOSTIC STUDIES: Oxygen Saturation is 100% on room air, normal by my interpretation.    COORDINATION OF CARE: 5:35 PM-Discussed treatment plan which includes pain medication and CXR with pt at bedside and pt agreed to plan.   Labs Review Labs Reviewed  BASIC METABOLIC PANEL - Abnormal; Notable for the following:    Potassium 3.4 (*)    GFR calc non Af Amer 75 (*)    GFR calc Af Amer 87 (*)    All other components within normal limits  PRO B NATRIURETIC PEPTIDE - Abnormal; Notable for the following:    Pro B Natriuretic peptide (BNP) 1121.0 (*)    All other components within normal limits  CBC WITH DIFFERENTIAL    Imaging Review No results found.   EKG Interpretation None      MDM   Final diagnoses:  Peripheral edema    80 year old female with symmetric lower extremity edema. No signs of DVT or  infection otherwise. No respiratory distress. Chest x-ray is clear. Plan increase in Lasix and potassium supplementation. Return precautions were discussed.   I personally preformed the services scribed in my presence. The recorded information has been reviewed is accurate. Virgel Manifold, MD.     Virgel Manifold, MD 03/28/14 [phone]

## 2014-03-15 NOTE — ED Notes (Signed)
Swelling of both feet. , headache, nausea.

## 2014-03-15 NOTE — ED Notes (Signed)
Pt left ED,  Ambulatory, with no sign of distress. Pt verbalized discharge instructions.

## 2014-03-29 ENCOUNTER — Encounter: Payer: Self-pay | Admitting: *Deleted

## 2014-08-22 ENCOUNTER — Encounter (HOSPITAL_COMMUNITY): Payer: Self-pay | Admitting: Emergency Medicine

## 2014-08-22 ENCOUNTER — Inpatient Hospital Stay (HOSPITAL_COMMUNITY)
Admission: EM | Admit: 2014-08-22 | Discharge: 2014-08-24 | DRG: 683 | Disposition: A | Discharge status: Home Health | Payer: Medicare Other | Attending: Family Medicine | Admitting: Family Medicine

## 2014-08-22 DIAGNOSIS — N390 Urinary tract infection, site not specified: Secondary | ICD-10-CM | POA: Diagnosis present

## 2014-08-22 DIAGNOSIS — E860 Dehydration: Secondary | ICD-10-CM | POA: Diagnosis present

## 2014-08-22 DIAGNOSIS — D649 Anemia, unspecified: Secondary | ICD-10-CM | POA: Diagnosis present

## 2014-08-22 DIAGNOSIS — Z8249 Family history of ischemic heart disease and other diseases of the circulatory system: Secondary | ICD-10-CM

## 2014-08-22 DIAGNOSIS — N19 Unspecified kidney failure: Secondary | ICD-10-CM | POA: Diagnosis present

## 2014-08-22 DIAGNOSIS — I10 Essential (primary) hypertension: Secondary | ICD-10-CM | POA: Diagnosis present

## 2014-08-22 DIAGNOSIS — R531 Weakness: Secondary | ICD-10-CM

## 2014-08-22 DIAGNOSIS — I959 Hypotension, unspecified: Secondary | ICD-10-CM | POA: Diagnosis not present

## 2014-08-22 DIAGNOSIS — R197 Diarrhea, unspecified: Secondary | ICD-10-CM | POA: Diagnosis not present

## 2014-08-22 DIAGNOSIS — E872 Acidosis, unspecified: Secondary | ICD-10-CM | POA: Diagnosis present

## 2014-08-22 DIAGNOSIS — E878 Other disorders of electrolyte and fluid balance, not elsewhere classified: Secondary | ICD-10-CM | POA: Diagnosis present

## 2014-08-22 DIAGNOSIS — E119 Type 2 diabetes mellitus without complications: Secondary | ICD-10-CM | POA: Diagnosis present

## 2014-08-22 DIAGNOSIS — E785 Hyperlipidemia, unspecified: Secondary | ICD-10-CM | POA: Diagnosis present

## 2014-08-22 DIAGNOSIS — N179 Acute kidney failure, unspecified: Secondary | ICD-10-CM | POA: Diagnosis present

## 2014-08-22 DIAGNOSIS — E875 Hyperkalemia: Secondary | ICD-10-CM | POA: Diagnosis present

## 2014-08-22 LAB — CBC WITH DIFFERENTIAL/PLATELET
Basophils Absolute: 0 10*3/uL (ref 0.0–0.1)
Basophils Relative: 0 % (ref 0–1)
Eosinophils Absolute: 0.1 10*3/uL (ref 0.0–0.7)
Eosinophils Relative: 1 % (ref 0–5)
HCT: 36.8 % (ref 36.0–46.0)
Hemoglobin: 11.8 g/dL — ABNORMAL LOW (ref 12.0–15.0)
Lymphocytes Relative: 14 % (ref 12–46)
Lymphs Abs: 1.2 10*3/uL (ref 0.7–4.0)
MCH: 29 pg (ref 26.0–34.0)
MCHC: 32.1 g/dL (ref 30.0–36.0)
MCV: 90.4 fL (ref 78.0–100.0)
Monocytes Absolute: 1.2 10*3/uL — ABNORMAL HIGH (ref 0.1–1.0)
Monocytes Relative: 14 % — ABNORMAL HIGH (ref 3–12)
Neutro Abs: 5.9 10*3/uL (ref 1.7–7.7)
Neutrophils Relative %: 71 % (ref 43–77)
Platelets: 331 10*3/uL (ref 150–400)
RBC: 4.07 MIL/uL (ref 3.87–5.11)
RDW: 14.3 % (ref 11.5–15.5)
WBC: 8.3 10*3/uL (ref 4.0–10.5)

## 2014-08-22 LAB — COMPREHENSIVE METABOLIC PANEL
ALT: 9 U/L — ABNORMAL LOW (ref 14–54)
AST: 12 U/L — ABNORMAL LOW (ref 15–41)
Albumin: 3.9 g/dL (ref 3.5–5.0)
Alkaline Phosphatase: 79 U/L (ref 38–126)
Anion gap: 7 (ref 5–15)
BUN: 37 mg/dL — ABNORMAL HIGH (ref 6–20)
CO2: 13 mmol/L — ABNORMAL LOW (ref 22–32)
Calcium: 9.3 mg/dL (ref 8.9–10.3)
Chloride: 116 mmol/L — ABNORMAL HIGH (ref 101–111)
Creatinine, Ser: 2.76 mg/dL — ABNORMAL HIGH (ref 0.44–1.00)
GFR calc Af Amer: 17 mL/min — ABNORMAL LOW (ref >60)
GFR calc non Af Amer: 15 mL/min — ABNORMAL LOW (ref >60)
Glucose, Bld: 101 mg/dL — ABNORMAL HIGH (ref 65–99)
Potassium: 6.7 mmol/L (ref 3.5–5.1)
Sodium: 136 mmol/L (ref 135–145)
Total Bilirubin: 0.3 mg/dL (ref 0.3–1.2)
Total Protein: 7.1 g/dL (ref 6.5–8.1)

## 2014-08-22 LAB — TSH: TSH: 2.125 u[IU]/mL (ref 0.350–4.500)

## 2014-08-22 LAB — URINALYSIS, ROUTINE W REFLEX MICROSCOPIC
Bilirubin Urine: NEGATIVE
Glucose, UA: NEGATIVE mg/dL
Ketones, ur: NEGATIVE mg/dL
Nitrite: POSITIVE — AB
Protein, ur: NEGATIVE mg/dL
Specific Gravity, Urine: 1.01 (ref 1.005–1.030)
Urobilinogen, UA: 0.2 mg/dL (ref 0.0–1.0)
pH: 5.5 (ref 5.0–8.0)

## 2014-08-22 LAB — URINE MICROSCOPIC-ADD ON

## 2014-08-22 LAB — MRSA PCR SCREENING: MRSA by PCR: NEGATIVE

## 2014-08-22 LAB — LIPASE, BLOOD: Lipase: 27 U/L (ref 22–51)

## 2014-08-22 LAB — MAGNESIUM: Magnesium: 2 mg/dL (ref 1.7–2.4)

## 2014-08-22 LAB — POTASSIUM: Potassium: 5.3 mmol/L — ABNORMAL HIGH (ref 3.5–5.1)

## 2014-08-22 MED ORDER — CHOLESTYRAMINE 4 G PO PACK
4.0000 g | PACK | Freq: Two times a day (BID) | ORAL | Status: DC
  Administered 2014-08-23 – 2014-08-24 (×3): 4 g via ORAL
  Filled 2014-08-22 (×8): qty 1

## 2014-08-22 MED ORDER — ONDANSETRON HCL 4 MG/2ML IJ SOLN: 4.0000 mg | Freq: Four times a day (QID) | INTRAMUSCULAR | Status: DC | PRN

## 2014-08-22 MED ORDER — DEXTROSE 50 % IV SOLN
INTRAVENOUS | Status: AC
Start: 2014-08-22 — End: 2014-08-23
  Filled 2014-08-22: qty 50

## 2014-08-22 MED ORDER — HEPARIN SODIUM (PORCINE) 5000 UNIT/ML IJ SOLN
5000.0000 [IU] | Freq: Three times a day (TID) | INTRAMUSCULAR | Status: DC
  Administered 2014-08-22 – 2014-08-24 (×6): 5000 [IU] via SUBCUTANEOUS
  Filled 2014-08-22 (×6): qty 1

## 2014-08-22 MED ORDER — SODIUM CHLORIDE 0.9 % IV SOLN
1000.0000 mL | Freq: Once | INTRAVENOUS | Status: AC
  Administered 2014-08-22: 1000 mL via INTRAVENOUS

## 2014-08-22 MED ORDER — INSULIN ASPART 100 UNIT/ML IV SOLN
10.0000 [IU] | Freq: Once | INTRAVENOUS | Status: AC
  Administered 2014-08-22: 10 [IU] via INTRAVENOUS

## 2014-08-22 MED ORDER — ONDANSETRON HCL 4 MG/2ML IJ SOLN
4.0000 mg | Freq: Once | INTRAMUSCULAR | Status: AC
  Administered 2014-08-22: 4 mg via INTRAVENOUS
  Filled 2014-08-22: qty 2

## 2014-08-22 MED ORDER — ALBUTEROL SULFATE (2.5 MG/3ML) 0.083% IN NEBU
10.0000 mg | INHALATION_SOLUTION | Freq: Once | RESPIRATORY_TRACT | Status: AC
  Administered 2014-08-22: 10 mg via RESPIRATORY_TRACT
  Filled 2014-08-22: qty 12

## 2014-08-22 MED ORDER — MECLIZINE HCL 12.5 MG PO TABS
25.0000 mg | ORAL_TABLET | Freq: Three times a day (TID) | ORAL | Status: DC | PRN
Start: 2014-08-22 — End: 2014-08-24

## 2014-08-22 MED ORDER — ONDANSETRON HCL 4 MG PO TABS: 4.0000 mg | ORAL_TABLET | Freq: Four times a day (QID) | ORAL | Status: DC | PRN

## 2014-08-22 MED ORDER — INSULIN ASPART 100 UNIT/ML ~~LOC~~ SOLN
SUBCUTANEOUS | Status: AC
Start: 2014-08-22 — End: 2014-08-23
  Filled 2014-08-22: qty 1

## 2014-08-22 MED ORDER — ACETAMINOPHEN 325 MG PO TABS
650.0000 mg | ORAL_TABLET | Freq: Four times a day (QID) | ORAL | Status: DC | PRN
  Administered 2014-08-24: 650 mg via ORAL
  Filled 2014-08-22: qty 2

## 2014-08-22 MED ORDER — PRAVASTATIN SODIUM 10 MG PO TABS
10.0000 mg | ORAL_TABLET | Freq: Every day | ORAL | Status: DC
  Administered 2014-08-22 – 2014-08-23 (×2): 10 mg via ORAL
  Filled 2014-08-22 (×2): qty 1

## 2014-08-22 MED ORDER — ACETAMINOPHEN 650 MG RE SUPP: 650.0000 mg | Freq: Four times a day (QID) | RECTAL | Status: DC | PRN

## 2014-08-22 MED ORDER — MONTELUKAST SODIUM 10 MG PO TABS
10.0000 mg | ORAL_TABLET | Freq: Every day | ORAL | Status: DC
  Administered 2014-08-22 – 2014-08-23 (×2): 10 mg via ORAL
  Filled 2014-08-22 (×2): qty 1

## 2014-08-22 MED ORDER — DEXTROSE 5 % IV SOLN
1.0000 g | INTRAVENOUS | Status: DC
  Administered 2014-08-22 – 2014-08-24 (×3): 1 g via INTRAVENOUS
  Filled 2014-08-22 (×3): qty 10

## 2014-08-22 MED ORDER — PANTOPRAZOLE SODIUM 40 MG PO TBEC
40.0000 mg | DELAYED_RELEASE_TABLET | Freq: Every morning | ORAL | Status: DC
  Administered 2014-08-23 – 2014-08-24 (×2): 40 mg via ORAL
  Filled 2014-08-22 (×2): qty 1

## 2014-08-22 MED ORDER — ASPIRIN EC 81 MG PO TBEC
81.0000 mg | DELAYED_RELEASE_TABLET | Freq: Every day | ORAL | Status: DC
  Administered 2014-08-23 – 2014-08-24 (×2): 81 mg via ORAL
  Filled 2014-08-22 (×2): qty 1

## 2014-08-22 MED ORDER — HYDROCODONE-ACETAMINOPHEN 5-325 MG PO TABS
1.0000 | ORAL_TABLET | ORAL | Status: DC | PRN
  Administered 2014-08-22 – 2014-08-23 (×2): 1 via ORAL
  Filled 2014-08-22 (×2): qty 1

## 2014-08-22 MED ORDER — SODIUM CHLORIDE 0.9 % IV SOLN
1000.0000 mL | INTRAVENOUS | Status: DC
  Administered 2014-08-22 – 2014-08-23 (×3): 1000 mL via INTRAVENOUS

## 2014-08-22 MED ORDER — ALUM & MAG HYDROXIDE-SIMETH 200-200-20 MG/5ML PO SUSP: 30.0000 mL | Freq: Four times a day (QID) | ORAL | Status: DC | PRN

## 2014-08-22 MED ORDER — DEXTROSE 50 % IV SOLN
1.0000 | Freq: Once | INTRAVENOUS | Status: AC
  Administered 2014-08-22: 50 mL via INTRAVENOUS

## 2014-08-22 NOTE — Progress Notes (Signed)
ANTIBIOTIC CONSULT NOTE - INITIAL  Pharmacy Consult for Rocephin Indication: UTI  Allergies  Allergen Reactions  . Codeine Other (See Comments)    Reaction unknown  . Sulfa Antibiotics Other (See Comments)    Reactions unknown   Patient Measurements: Height: 5\' 6"  (167.6 cm) Weight: 109 lb 5.6 oz (49.6 kg) IBW/kg (Calculated) : 59.3  Vital Signs: Temp: 98.3 F (36.8 C) (05/23 1041) Temp Source: Rectal (05/23 1041) BP: 106/51 mmHg (05/23 1400) Pulse Rate: 63 (05/23 1400) Intake/Output from previous day:   Intake/Output from this shift:    Labs:  Recent Labs  08/22/14 1134  WBC 8.3  HGB 11.8*  PLT 331  CREATININE 2.76*   Estimated Creatinine Clearance: 11.9 mL/min (by C-G formula based on Cr of 2.76). No results for input(s): VANCOTROUGH, VANCOPEAK, VANCORANDOM, GENTTROUGH, GENTPEAK, GENTRANDOM, TOBRATROUGH, TOBRAPEAK, TOBRARND, AMIKACINPEAK, AMIKACINTROU, AMIKACIN in the last 72 hours.   Microbiology: No results found for this or any previous visit (from the past 720 hour(s)).  Medical History: Past Medical History  Diagnosis Date  . Hypertension   . Hyperlipemia   . Gastric ulcer   . Chronic diarrhea   . Vertigo   . Sliding hiatal hernia   . Schatzki's ring   . Duodenal stricture   . GI bleed   . Microscopic colitis   . Acute blood loss anemia   . Dilation of biliary tract     biliary duct per 02/26/13 CT  . Pancreatic duct dilated 02/26/13  . Esophageal stricture   . Diabetes   . Skin cancer   . Nephrolithiasis    Anti-infectives    Start     Dose/Rate Route Frequency Ordered Stop   08/22/14 1600  cefTRIAXone (ROCEPHIN) 1 g in dextrose 5 % 50 mL IVPB     1 g 100 mL/hr over 30 Minutes Intravenous Every 24 hours 08/22/14 1505       Assessment: 81yo female with h/o HTN, diarrhea, ulcers, DM and now presents to ED w/ c/o diarrhea.  Evaluation reveals UTI.  Asked to initiate Rocephin.  Goal of Therapy:  Eradicate infection.  Plan:  Rocephin  1gm IV q24hrs (no renal adjustment needed) Monitor cultures and sensitivities Switch to PO antibiotic when clinically indicated  Hart Robinsons A 08/22/2014,3:05 PM

## 2014-08-22 NOTE — ED Notes (Signed)
Patient with c/o intermittent diarrhea and abdominal cramping x 2 weeks. Saw Dr Nevada Crane for same. Poor historian.

## 2014-08-22 NOTE — ED Provider Notes (Signed)
CSN: [id number]     Arrival date & time 08/22/14  1029 History  This chart was scribed for Renee Rank, MD by Girtha Hake, ED Scribe. The patient was seen in [facility]. The patient's care was started at 11:01 AM.   Chief Complaint  Patient presents with  . Diarrhea    Patient is a 81 y.o. female presenting with diarrhea. The history is provided by the patient and a relative. No language interpreter was used.  Diarrhea Quality:  Watery Severity:  Moderate Onset quality:  Gradual Duration:  3 weeks Timing:  Intermittent Progression:  Unchanged Relieved by:  Nothing Worsened by:  Nothing tried Associated symptoms: no vomiting    HPI Comments: Renee Blake is a 81 y.o. female who presents to the Emergency Department complaining of diarrhea beginning 3 weeks ago. Patient has experienced similar episodes intermittently for the past 8 months. She states that the diarrhea is different today because she also experienced abdominal pain, nausea, and a drop in her blood pressure. Patient has experienced 4-5 episodes of diarrhea for the past 3 weeks. She states that her stools are typically a brown or yellow color. She denies vomiting or hematochezia.   PCP is Dr. Nevada Crane. She has seen him several times for the same problem over the last year without a specific diagnosis.   Past Medical History  Diagnosis Date  . Hypertension   . Hyperlipemia   . Gastric ulcer   . Chronic diarrhea   . Vertigo   . Sliding hiatal hernia   . Schatzki's ring   . Duodenal stricture   . GI bleed   . Microscopic colitis   . Acute blood loss anemia   . Dilation of biliary tract     biliary duct per 02/26/13 CT  . Pancreatic duct dilated 02/26/13  . Esophageal stricture   . Diabetes   . Skin cancer   . Nephrolithiasis    Past Surgical History  Procedure Laterality Date  . Nose surgery      for skin cancer  . Esophagogastroduodenoscopy N/A 02/27/2013    Procedure: ESOPHAGOGASTRODUODENOSCOPY (EGD);   Surgeon: Ladene Artist, MD;  Location: [facility] ENDOSCOPY;  Service: Endoscopy;  Laterality: N/A;  . Hot hemostasis N/A 02/27/2013    Procedure: HOT HEMOSTASIS (ARGON PLASMA COAGULATION/BICAP);  Surgeon: Ladene Artist, MD;  Location: [facility] ENDOSCOPY;  Service: Endoscopy;  Laterality: N/A;  . Esophagogastroduodenoscopy N/A 03/28/2013    Procedure: ESOPHAGOGASTRODUODENOSCOPY (EGD);  Surgeon: Rogene Houston, MD;  Location: [facility] ENDO SUITE;  Service: Endoscopy;  Laterality: N/A;   Family History  Problem Relation Age of Onset  . Heart attack Father    History  Substance Use Topics  . Smoking status: Never Smoker   . Smokeless tobacco: Never Used  . Alcohol Use: No   OB History    Gravida Para Term Preterm AB TAB SAB Ectopic Multiple Living   6 6 6       6      Review of Systems  Gastrointestinal: Positive for nausea and diarrhea. Negative for vomiting and blood in stool.  All other systems reviewed and are negative.     Allergies  Codeine and Sulfa antibiotics  Home Medications   Prior to Admission medications   Medication Sig Start Date End Date Taking? Authorizing Provider  amLODipine (NORVASC) 5 MG tablet Take 5 mg by mouth daily.   Yes Historical Provider, MD  aspirin EC 81 MG tablet Take 81 mg by mouth daily.   Yes Historical  Provider, MD  atenolol (TENORMIN) 50 MG tablet Take 50 mg by mouth daily.    Yes Historical Provider, MD  cholestyramine Lucrezia Starch) 4 GM/DOSE powder Take 4 g by mouth 2 (two) times daily with a meal.  01/12/14  Yes Historical Provider, MD  furosemide (LASIX) 20 MG tablet Take 20 mg by mouth daily. 03/08/14  Yes Historical Provider, MD  lisinopril (PRINIVIL,ZESTRIL) 40 MG tablet Take 40 mg by mouth daily.   Yes Historical Provider, MD  loperamide (IMODIUM) 2 MG capsule Take 2 mg by mouth every morning. 02/26/14  Yes Historical Provider, MD  lovastatin (MEVACOR) 20 MG tablet Take 20 mg by mouth at bedtime.   Yes Historical Provider, MD  montelukast (SINGULAIR)  10 MG tablet Take 10 mg by mouth at bedtime.   Yes Historical Provider, MD  pantoprazole (PROTONIX) 40 MG tablet Take 40 mg by mouth every morning.    Yes Historical Provider, MD  potassium chloride (K-DUR,KLOR-CON) 10 MEQ tablet Take 10-20 mEq by mouth 2 (two) times daily. Take 2 tablet in the morning and 1 tablet in the evening   Yes Historical Provider, MD  spironolactone (ALDACTONE) 25 MG tablet Take 12.5 mg by mouth 2 (two) times daily. Patient is taking 1/2 tablet by mouth in the morning and 1/2 by mouth in the evening. 01/26/14  Yes Historical Provider, MD  meclizine (ANTIVERT) 25 MG tablet Take 25 mg by mouth 3 (three) times daily as needed for dizziness.  03/15/14   Historical Provider, MD   Triage Vitals: BP 127/56 mmHg  Pulse 51  Temp(Src) 98.3 F (36.8 C) (Rectal)  Resp 17  Ht 5\' 6"  (1.676 m)  Wt 113 lb (51.256 kg)  BMI 18.25 kg/m2  SpO2 100% Physical Exam  Constitutional: No distress.  Elderly.  HENT:  Head: Normocephalic and atraumatic.  Right Ear: External ear normal.  Left Ear: External ear normal.  Eyes: Conjunctivae are normal. Right eye exhibits no discharge. Left eye exhibits no discharge. No scleral icterus.  Neck: Neck supple. No tracheal deviation present.  Cardiovascular: Normal rate, regular rhythm and intact distal pulses.   Pulmonary/Chest: Effort normal and breath sounds normal. No stridor. No respiratory distress. She has no wheezes. She has no rales.  Abdominal: Soft. Bowel sounds are normal. She exhibits no distension. There is no tenderness. There is no rebound and no guarding.  Musculoskeletal: She exhibits no edema or tenderness.  Neurological: She is alert. She has normal strength. No cranial nerve deficit (no facial droop, extraocular movements intact, no slurred speech) or sensory deficit. She exhibits normal muscle tone. She displays no seizure activity. Coordination normal.  Skin: Skin is warm and dry. No rash noted.  Psychiatric: She has a normal  mood and affect.  Nursing note and vitals reviewed.   ED Course  Procedures (including critical care time) DIAGNOSTIC STUDIES: Oxygen Saturation is 100% on room air, normal by my interpretation.    COORDINATION OF CARE:    Labs Review Labs Reviewed  CBC WITH DIFFERENTIAL/PLATELET - Abnormal; Notable for the following:    Hemoglobin 11.8 (*)    Monocytes Relative 14 (*)    Monocytes Absolute 1.2 (*)    All other components within normal limits  COMPREHENSIVE METABOLIC PANEL - Abnormal; Notable for the following:    Potassium 6.7 (*)    Chloride 116 (*)    CO2 13 (*)    Glucose, Bld 101 (*)    BUN 37 (*)    Creatinine, Ser 2.76 (*)  AST 12 (*)    ALT 9 (*)    GFR calc non Af Amer 15 (*)    GFR calc Af Amer 17 (*)    All other components within normal limits  URINALYSIS, ROUTINE W REFLEX MICROSCOPIC - Abnormal; Notable for the following:    APPearance CLOUDY (*)    Hgb urine dipstick TRACE (*)    Nitrite POSITIVE (*)    Leukocytes, UA MODERATE (*)    All other components within normal limits  URINE MICROSCOPIC-ADD ON - Abnormal; Notable for the following:    Squamous Epithelial / LPF FEW (*)    Bacteria, UA MANY (*)    All other components within normal limits  LIPASE, BLOOD    Imaging Review No results found.   EKG Interpretation   Date/Time:  Monday Aug 22 2014 12:17:49 EDT Ventricular Rate:  58 PR Interval:  203 QRS Duration: 89 QT Interval:  399 QTC Calculation: 392 R Axis:   -11 Text Interpretation:  Sinus rhythm Atrial premature complex Abnormal  R-wave progression, early transition No significant change since last  tracing Confirmed by Trenda Corliss  MD-J, Derriona Branscom ([id number]) on 08/22/2014 12:26:37 PM     CRITICAL CARE Performed by: INOMV,EHM Total critical care time: 30 Critical care time was exclusive of separately billable procedures and treating other patients. Critical care was necessary to treat or prevent imminent or life-threatening  deterioration. Critical care was time spent personally by me on the following activities: development of treatment plan with patient and/or surrogate as well as nursing, discussions with consultants, evaluation of patient's response to treatment, examination of patient, obtaining history from patient or surrogate, ordering and performing treatments and interventions, ordering and review of laboratory studies, ordering and review of radiographic studies, pulse oximetry and re-evaluation of patient's condition. Medications  0.9 %  sodium chloride infusion (0 mLs Intravenous Stopped 08/22/14 1239)    Followed by  0.9 %  sodium chloride infusion (1,000 mLs Intravenous New Bag/Given 08/22/14 1239)  albuterol (PROVENTIL) (2.5 MG/3ML) 0.083% nebulizer solution 10 mg (not administered)  insulin aspart (novoLOG) injection 10 Units (not administered)  dextrose 50 % solution 50 mL (not administered)  ondansetron (ZOFRAN) injection 4 mg (4 mg Intravenous Given 08/22/14 1123)    MDM   Final diagnoses:  Acute renal failure, unspecified acute renal failure type  Hyperkalemia    Patient's laboratory tests show acute renal failure with associated hyperkalemia. The patient's EKG does not show any acute abnormalities. The patient has been having issues with chronic diarrhea. I believe she became dehydrated and is the primary cause of the acute renal failure. Patient however is on ACE inhibitor's, lasix  and Aldactone. These  could be contributing factors.  Plan on IV hydration. Glucose, insulin and albuterol for the hyperkalemia. Consult with medical service for admission.  I personally performed the services described in this documentation, which was scribed in my presence.  The recorded information has been reviewed and is accurate.    Renee Rank, MD 08/22/14 [phone]

## 2014-08-22 NOTE — H&P (Signed)
[facility] History and Physical  Renee Blake DVV:[id number] DOB: [birth year] DOA: 08/22/2014  Referring physician: Tomi Bamberger PCP: Delphina Cahill, MD   Chief Complaint: diarrhea  HPI: Renee Blake is a very pleasant 81 y.o. female with a past medical history that includes hypertension, chronic diarrhea, duodenal ulcer, diabetes, presents to the emergency Department chief complaint of diarrhea. Initial evaluation reveals a urinary tract infection, acute renal failure, dehydration and hyperkalemia. Patient reports chronic diarrhea worsening over the last several weeks. She states her primary care provider manages her diarrhea with lab work every 3 months. Last visit adjustments were made to her blood pressure medication. She had lab work done 4 days ago and was scheduled for follow-up tomorrow. Today with diarrhea she developed abdominal pain in her lower quadrants described as cramp like rated 7 out of 10, nausea with no vomiting, dizziness, generalized weakness, near syncope. She denies bright red blood per rectum or melena. He denies headache visual disturbances numbness or tingling of extremities. She denies chest pain palpitations shortness of breath. She does report some frequency and "not making enough urine". He denies dysuria hematuria.  Workup in the emergency department includes complete metabolic panel significant for potassium of 6.7 chloride 116 BUN 37 creatinine 2.76, complete blood count is unremarkable, urinalysis positive nitrite moderate leukocytes a bacteria, EKG with sinus rhythm abnormal P wave.  The emergency department he is afebrile hemodynamically stable with a blood pressure on the low end of normal not hypoxic. In the emergency department she is provided with IV fluids and IV insulin glucose and albuterol.   Review of Systems:  10 point review of systems complete and all systems are negative except as indicated in the history of present illness  Past Medical History    Diagnosis Date  . Hypertension   . Hyperlipemia   . Gastric ulcer   . Chronic diarrhea   . Vertigo   . Sliding hiatal hernia   . Schatzki's ring   . Duodenal stricture   . GI bleed   . Microscopic colitis   . Acute blood loss anemia   . Dilation of biliary tract     biliary duct per 02/26/13 CT  . Pancreatic duct dilated 02/26/13  . Esophageal stricture   . Diabetes   . Skin cancer   . Nephrolithiasis    Past Surgical History  Procedure Laterality Date  . Nose surgery      for skin cancer  . Esophagogastroduodenoscopy N/A 02/27/2013    Procedure: ESOPHAGOGASTRODUODENOSCOPY (EGD);  Surgeon: Ladene Artist, MD;  Location: [facility] ENDOSCOPY;  Service: Endoscopy;  Laterality: N/A;  . Hot hemostasis N/A 02/27/2013    Procedure: HOT HEMOSTASIS (ARGON PLASMA COAGULATION/BICAP);  Surgeon: Ladene Artist, MD;  Location: [facility] ENDOSCOPY;  Service: Endoscopy;  Laterality: N/A;  . Esophagogastroduodenoscopy N/A 03/28/2013    Procedure: ESOPHAGOGASTRODUODENOSCOPY (EGD);  Surgeon: Rogene Houston, MD;  Location: [facility] ENDO SUITE;  Service: Endoscopy;  Laterality: N/A;   Social History:  reports that she has never smoked. She has never used smokeless tobacco. She reports that she does not drink alcohol or use illicit drugs.  Allergies  Allergen Reactions  . Codeine Other (See Comments)    Reaction unknown  . Sulfa Antibiotics Other (See Comments)    Reactions unknown    Family History  Problem Relation Age of Onset  . Heart attack Father      Prior to Admission medications   Medication Sig Start Date End Date Taking? Authorizing Provider  amLODipine (NORVASC)  5 MG tablet Take 5 mg by mouth daily.   Yes Historical Provider, MD  aspirin EC 81 MG tablet Take 81 mg by mouth daily.   Yes Historical Provider, MD  atenolol (TENORMIN) 50 MG tablet Take 50 mg by mouth daily.    Yes Historical Provider, MD  cholestyramine Lucrezia Starch) 4 GM/DOSE powder Take 4 g by mouth 2 (two) times daily with a  meal.  01/12/14  Yes Historical Provider, MD  furosemide (LASIX) 20 MG tablet Take 20 mg by mouth daily. 03/08/14  Yes Historical Provider, MD  lisinopril (PRINIVIL,ZESTRIL) 40 MG tablet Take 40 mg by mouth daily.   Yes Historical Provider, MD  loperamide (IMODIUM) 2 MG capsule Take 2 mg by mouth every morning. 02/26/14  Yes Historical Provider, MD  lovastatin (MEVACOR) 20 MG tablet Take 20 mg by mouth at bedtime.   Yes Historical Provider, MD  montelukast (SINGULAIR) 10 MG tablet Take 10 mg by mouth at bedtime.   Yes Historical Provider, MD  pantoprazole (PROTONIX) 40 MG tablet Take 40 mg by mouth every morning.    Yes Historical Provider, MD  potassium chloride (K-DUR,KLOR-CON) 10 MEQ tablet Take 10-20 mEq by mouth 2 (two) times daily. Take 2 tablet in the morning and 1 tablet in the evening   Yes Historical Provider, MD  spironolactone (ALDACTONE) 25 MG tablet Take 12.5 mg by mouth 2 (two) times daily. Patient is taking 1/2 tablet by mouth in the morning and 1/2 by mouth in the evening. 01/26/14  Yes Historical Provider, MD  meclizine (ANTIVERT) 25 MG tablet Take 25 mg by mouth 3 (three) times daily as needed for dizziness.  03/15/14   Historical Provider, MD   Physical Exam: Filed Vitals:   08/22/14 1330 08/22/14 1400 08/22/14 1419 08/22/14 1451  BP: 107/49 106/51    Pulse:  63    Temp:      TempSrc:      Resp: 20 14    Height:    5\' 6"  (1.676 m)  Weight:    49.6 kg (109 lb 5.6 oz)  SpO2:  100% 100%     Wt Readings from Last 3 Encounters:  08/22/14 49.6 kg (109 lb 5.6 oz)  03/15/14 50.349 kg (111 lb)  02/01/14 52.39 kg (115 lb 8 oz)    General:  Appears calm and comfortable, somewhat lethargic Eyes: PERRL, normal lids, irises & conjunctiva ENT: grossly normal hearing, lips & tongue, his membranes of her mouth are pink but slightly dry Neck: no LAD, masses or thyromegaly Cardiovascular: RRR, no m/r/g. No LE edema. Pedal pulses present and palpable  Respiratory: CTA bilaterally,  no w/r/r. Normal respiratory effort. Abdomen: soft, ntnd positive bowel sounds no guarding no rebound Skin: no rash or induration seen on limited exam Musculoskeletal: grossly normal tone BUE/BLE, moves all extremities Psychiatric: grossly normal mood and affect, speech fluent and appropriate Neurologic: grossly non-focal. Speech clear cranial nerves II through XII grossly intact           Labs on Admission:  Basic Metabolic Panel:  Recent Labs Lab 08/22/14 1134  NA 136  K 6.7*  CL 116*  CO2 13*  GLUCOSE 101*  BUN 37*  CREATININE 2.76*  CALCIUM 9.3   Liver Function Tests:  Recent Labs Lab 08/22/14 1134  AST 12*  ALT 9*  ALKPHOS 79  BILITOT 0.3  PROT 7.1  ALBUMIN 3.9    Recent Labs Lab 08/22/14 1134  LIPASE 27   No results for input(s): AMMONIA in the  last 168 hours. CBC:  Recent Labs Lab 08/22/14 1134  WBC 8.3  NEUTROABS 5.9  HGB 11.8*  HCT 36.8  MCV 90.4  PLT 331   Cardiac Enzymes: No results for input(s): CKTOTAL, CKMB, CKMBINDEX, TROPONINI in the last 168 hours.  BNP (last 3 results) No results for input(s): BNP in the last 8760 hours.  ProBNP (last 3 results)  Recent Labs  09/16/13 1529 11/21/13 1254 03/15/14 1818  PROBNP 1532.0* 708.6* 1121.0*    CBG: No results for input(s): GLUCAP in the last 168 hours.  Radiological Exams on Admission: No results found.  EKG: Independently reviewed sinus rhythm abnormal P-wave progression  Assessment/Plan Principal Problem:   Renal failure: Likely multifactorial i.e. urinary tract infection resulting in dehydration in the setting of ACE inhibitor, Lasix and Aldactone. Will admit to step down. Will provide gentle IV fluids. Will hold any nephrotoxins. Will monitor her urine output. If no improvement will consider renal ultrasound  Active Problems:  UTI (urinary tract infection): Rocephin per pharmacy. Urine culture pending. Currently she is afebrile and hemodynamically stable. Will monitor  closely   Hyperkalemia: Related to #1. She was given glucose IV insulin and albuterol in the emergency department. Will recheck her potassium level this evening. EKG without acute changes. Monitor on telemetry.  Dehydration: General IV hydration. Patient requesting food and drink in the emergency department. Monitor intake and output. Recheck in the morning. Will check orthostatics tomorrow as well   Generalized weakness    Diarrhea: Portably chronic. Home medications include Imodium and Questran. Will continue the Questran as able. Pain stool samples as able. No recent antibiotics reportedly      Diabetes mellitus: Diet controlled. Will check an A1c.  Hypertension: Patient's blood pressure was on the soft side in the emergency department. Her home medications include amlodipine, atenolol, Lasix, lisinopril. I will hold these for now. Will monitor her blood pressure and resume as indicated.      Code Status: full DVT Prophylaxis: Family Communication: daughter at bedside Disposition Plan: home hopefully tomorrow  Time spent: 78 minutes  [facility] Pager [phone]

## 2014-08-22 NOTE — ED Notes (Signed)
CRITICAL VALUE ALERT  Critical value received:  Potassium  Date of notification:  08/22/14  Time of notification:  1208  Critical value read back:Yes.    Nurse who received alert:  Maxwell Caul, RN  MD notified (1st page):  Dr Hillard Danker  Time of first page:  1208

## 2014-08-22 NOTE — ED Notes (Signed)
Pt ambulated to beside toilet with no complaints.

## 2014-08-23 DIAGNOSIS — N179 Acute kidney failure, unspecified: Secondary | ICD-10-CM | POA: Insufficient documentation

## 2014-08-23 DIAGNOSIS — E872 Acidosis, unspecified: Secondary | ICD-10-CM | POA: Diagnosis present

## 2014-08-23 LAB — COMPREHENSIVE METABOLIC PANEL
ALT: 6 U/L — ABNORMAL LOW (ref 14–54)
AST: 12 U/L — ABNORMAL LOW (ref 15–41)
Albumin: 3 g/dL — ABNORMAL LOW (ref 3.5–5.0)
Alkaline Phosphatase: 67 U/L (ref 38–126)
Anion gap: 4 — ABNORMAL LOW (ref 5–15)
BUN: 29 mg/dL — ABNORMAL HIGH (ref 6–20)
CO2: 12 mmol/L — ABNORMAL LOW (ref 22–32)
Calcium: 8.4 mg/dL — ABNORMAL LOW (ref 8.9–10.3)
Chloride: 126 mmol/L — ABNORMAL HIGH (ref 101–111)
Creatinine, Ser: 1.73 mg/dL — ABNORMAL HIGH (ref 0.44–1.00)
GFR calc Af Amer: 30 mL/min — ABNORMAL LOW (ref >60)
GFR calc non Af Amer: 26 mL/min — ABNORMAL LOW (ref >60)
Glucose, Bld: 81 mg/dL (ref 65–99)
Potassium: 4.9 mmol/L (ref 3.5–5.1)
Sodium: 142 mmol/L (ref 135–145)
Total Bilirubin: 0.3 mg/dL (ref 0.3–1.2)
Total Protein: 5.4 g/dL — ABNORMAL LOW (ref 6.5–8.1)

## 2014-08-23 LAB — CLOSTRIDIUM DIFFICILE BY PCR: Toxigenic C. Difficile by PCR: NEGATIVE

## 2014-08-23 LAB — CBC
HCT: 27.9 % — ABNORMAL LOW (ref 36.0–46.0)
Hemoglobin: 9 g/dL — ABNORMAL LOW (ref 12.0–15.0)
MCH: 29.6 pg (ref 26.0–34.0)
MCHC: 32.3 g/dL (ref 30.0–36.0)
MCV: 91.8 fL (ref 78.0–100.0)
Platelets: 242 10*3/uL (ref 150–400)
RBC: 3.04 MIL/uL — ABNORMAL LOW (ref 3.87–5.11)
RDW: 14.6 % (ref 11.5–15.5)
WBC: 4.6 10*3/uL (ref 4.0–10.5)

## 2014-08-23 MED ORDER — SODIUM BICARBONATE 8.4 % IV SOLN
INTRAVENOUS | Status: DC
  Administered 2014-08-23: 10:00:00 via INTRAVENOUS
  Filled 2014-08-23 (×6): qty 1000

## 2014-08-23 MED ORDER — SODIUM CHLORIDE 0.45 % IV SOLN: INTRAVENOUS | Status: DC

## 2014-08-23 MED ORDER — SODIUM CHLORIDE 0.9 % IV BOLUS (SEPSIS)
500.0000 mL | Freq: Once | INTRAVENOUS | Status: AC
  Administered 2014-08-23: 500 mL via INTRAVENOUS

## 2014-08-23 NOTE — Progress Notes (Signed)
eLink Physician-Brief Progress Note Patient Name: Renee Blake DOB: May 13, 1928 MRN: [id number]   Date of Service  08/23/2014  HPI/Events of Note  Hypotension with BP of 85/42 (52)  eICU Interventions  Plan: 500 cc NS bolus for BP support     Intervention Category Intermediate Interventions: Hypotension - evaluation and management  DETERDING,ELIZABETH 08/23/2014, 1:20 AM

## 2014-08-23 NOTE — Progress Notes (Signed)
[facility] PROGRESS NOTE  Trishna Cwik FBP:[id number] DOB: 01-Aug-1928 DOA: 08/22/2014 PCP: Delphina Cahill, MD  Assessment/Plan: Renal failure: Likely multifactorial i.e. urinary tract infection resulting in dehydration in the setting of ACE inhibitor, Lasix and Aldactone. Creatinine trending down. Urine output good. Potassium level within limits of normal. Will continue IV fluids but change to D5 with bicarb. Continue to hold nephrotoxins. Monitor closely  Active Problems: UTI (urinary tract infection): Rocephin day #2. Urine culture pending.  She remains afebrile and hemodynamically stable. Will monitor closely  Hyperkalemia: Related to #1. Resolved today.  Dehydration: continue IV fluids as noted above. Await orthostatics.   Generalized weakness: PT consult   Diarrhea:  Chronic and stable at baseline. . Home medications include Imodium and Questran. Will continue these. await stool samples. No recent antibiotics reportedly    Diabetes mellitus: Diet controlled. Await A1c.  Hypertension: stable at low end of normal. Continue to hold.  Will monitor her blood pressure and resume as indicated.     Code Status: full Family Communication: none present Disposition Plan: home when ready   Consultants:  none  Procedures:  none  Antibiotics:  Rocephin 08/22/14>>  HPI/Subjective: Awake alert reports sleeping well and feeling better. Denies pain/discomfort  Objective: Filed Vitals:   08/23/14 0819  BP:   Pulse:   Temp: 97.9 F (36.6 C)  Resp:     Intake/Output Summary (Last 24 hours) at 08/23/14 1003 Last data filed at 08/23/14 0819  Gross per 24 hour  Intake 3467.5 ml  Output      4 ml  Net 3463.5 ml   Filed Weights   08/22/14 1038 08/22/14 1451 08/23/14 0430  Weight: 51.256 kg (113 lb) 49.6 kg (109 lb 5.6 oz) 51.6 kg (113 lb 12.1 oz)    Exam:   General:  Well nourished  Cardiovascular: RRR no MGR no LE edema  Respiratory: normal effort BS  clear bilaterally  Abdomen: non-distended non-tender  Musculoskeletal: no clubbing or cyanosis   Data Reviewed: Basic Metabolic Panel:  Recent Labs Lab 08/22/14 1123 08/22/14 1134 08/22/14 2000 08/23/14 0352  NA  --  136  --  142  K  --  6.7* 5.3* 4.9  CL  --  116*  --  126*  CO2  --  13*  --  12*  GLUCOSE  --  101*  --  81  BUN  --  37*  --  29*  CREATININE  --  2.76*  --  1.73*  CALCIUM  --  9.3  --  8.4*  MG 2.0  --   --   --    Liver Function Tests:  Recent Labs Lab 08/22/14 1134 08/23/14 0352  AST 12* 12*  ALT 9* 6*  ALKPHOS 79 67  BILITOT 0.3 0.3  PROT 7.1 5.4*  ALBUMIN 3.9 3.0*    Recent Labs Lab 08/22/14 1134  LIPASE 27   No results for input(s): AMMONIA in the last 168 hours. CBC:  Recent Labs Lab 08/22/14 1134 08/23/14 0352  WBC 8.3 4.6  NEUTROABS 5.9  --   HGB 11.8* 9.0*  HCT 36.8 27.9*  MCV 90.4 91.8  PLT 331 242   Cardiac Enzymes: No results for input(s): CKTOTAL, CKMB, CKMBINDEX, TROPONINI in the last 168 hours. BNP (last 3 results) No results for input(s): BNP in the last 8760 hours.  ProBNP (last 3 results)  Recent Labs  09/16/13 1529 11/21/13 1254 03/15/14 1818  PROBNP 1532.0* 708.6* 1121.0*    CBG: No results for  input(s): GLUCAP in the last 168 hours.  Recent Results (from the past 240 hour(s))  MRSA PCR Screening     Status: None   Collection Time: 08/22/14  2:50 PM  Result Value Ref Range Status   MRSA by PCR NEGATIVE NEGATIVE Final    Comment:        The GeneXpert MRSA Assay (FDA approved for NASAL specimens only), is one component of a comprehensive MRSA colonization surveillance program. It is not intended to diagnose MRSA infection nor to guide or monitor treatment for MRSA infections.      Studies: No results found.  Scheduled Meds: . aspirin EC  81 mg Oral Daily  . cefTRIAXone (ROCEPHIN)  IV  1 g Intravenous Q24H  . cholestyramine  4 g Oral BID WC  . heparin  5,000 Units Subcutaneous 3  times per day  . montelukast  10 mg Oral QHS  . pantoprazole  40 mg Oral q morning - 10a  . pravastatin  10 mg Oral q1800   Continuous Infusions: . dextrose 5 % 1,000 mL with sodium bicarbonate 150 mEq infusion      Principal Problem:   Renal failure Active Problems:   Diarrhea   Dehydration   Generalized weakness   Diabetes mellitus   Hyperkalemia   UTI (urinary tract infection)   Acute renal failure    Time spent: 30 minutes    [facility] Pager [phone]. If 7PM-7AM, please contact night-coverage at [email or web address], password [facility] 08/23/2014, 10:03 AM  LOS: 1 day

## 2014-08-23 NOTE — Evaluation (Addendum)
Physical Therapy Evaluation Patient Details Name: Renee Blake MRN: [id number] DOB: Aug 08, 1928 Today's Date: 08/23/2014   History of Present Illness  Pt is an 81 year old female admitted with acute renal failure, hypekalemia, weakness and dehydration.  She has chronic diarrhea.  She lives with her daughter and is normally independent with ADLs.  Clinical Impression   Pt was seen for evaluation.  She has moved out of the ICU.  She is found to be alert, oriented and very cooperative.  On MMT she has mild generalized weakness but is able to transfer OOB without assist.  She was able to ambulate in the room (20') with no assistive equipment and good stability.  I am recommending that at discharge she stay with her daughter who does not work outside of the home so that she initially has assistance.  She is agreeable to HHPT.     Follow Up Recommendations Home health PT    Equipment Recommendations  None recommended by PT (pt is not interested in any assistive devices)    Recommendations for Other Services   none    Precautions / Restrictions Precautions Precautions: Fall Restrictions Weight Bearing Restrictions: No      Mobility  Bed Mobility Overal bed mobility: Modified Independent                Transfers Overall transfer level: Modified independent Equipment used: None                Ambulation/Gait Ambulation/Gait assistance: Supervision Ambulation Distance (Feet): 20 Feet Assistive device: None Gait Pattern/deviations: WFL(Within Functional Limits)   Gait velocity interpretation: Below normal speed for age/gender                      Balance Overall balance assessment: No apparent balance deficits (not formally assessed)                                           Pertinent Vitals/Pain Pain Assessment: No/denies pain    Home Living Family/patient expects to be discharged to:: Private residence Living Arrangements:  Children Available Help at Discharge: Family;Available 24 hours/day Type of Home: Apartment Home Access: Level entry     Home Layout: One level Home Equipment: None Additional Comments: daughter works daytime but has another daughter in the area with whom she could stay    Prior Function Level of Independence: Independent                       Extremity/Trunk Assessment               Lower Extremity Assessment: Generalized weakness         Communication   Communication: No difficulties  Cognition Arousal/Alertness: Awake/alert Behavior During Therapy: WFL for tasks assessed/performed Overall Cognitive Status: Within Functional Limits for tasks assessed                                    Assessment/Plan    PT Assessment Patient needs continued PT services  PT Diagnosis Generalized weakness   PT Problem List Decreased activity tolerance;Decreased mobility;Decreased strength  PT Treatment Interventions Gait training;Therapeutic exercise   PT Goals (Current goals can be found in the Care Plan section) Acute Rehab PT Goals Patient Stated Goal: return to independence PT  Goal Formulation: With patient Time For Goal Achievement: 09/06/14 Potential to Achieve Goals: Good    Frequency Min 3X/week   Barriers to discharge Decreased caregiver support daughter works daytime...it would be best if she stays with her other daughter who is home during the day                   End of Session Equipment Utilized During Treatment: Gait belt Activity Tolerance: Patient tolerated treatment well Patient left: in chair;with call bell/phone within reach;with chair alarm set           Time: 1330-1401 PT Time Calculation (min) (ACUTE ONLY): 31 min   Charges:   PT Evaluation $Initial PT Evaluation Tier I: 1 Procedure     PT G CodesDemetrios Isaacs L  PT 08/23/2014, 2:43 PM

## 2014-08-23 NOTE — Progress Notes (Signed)
Called report to Ophelia Shoulder, RN on dept 300.  Verbalized understanding.  Pt transferred to room 327 in safe and stable condition. Schonewitz, Eulis Canner 08/23/2014

## 2014-08-23 NOTE — Care Management Note (Signed)
Case Management Note  Patient Details  Name: Renee Blake MRN: [id number] Date of Birth: 17-Jul-1928  Expected Discharge Date:                  Expected Discharge Plan:  [facility]  In-House Referral:  NA  Discharge planning Services  CM Consult  Post Acute Care Choice:  Home Health Choice offered to:  Patient  DME Arranged:    DME Agency:     HH Arranged:  PT [place]:  [place]  Status of Service:  Completed, signed off  Medicare Important Message Given:    Date Medicare IM Given:    Medicare IM give by:    Date Additional Medicare IM Given:    Additional Medicare Important Message give by:     If discussed at [place] of Stay Meetings, dates discussed:    Additional Comments: Pt is from home, lives with daughter, who works during the day. Pt previously independent at baseline. Pt has another daughter who she plans to stay with after hospitalization who can be at home with her all day. Pt has no DME's or HH services prior to admission but has used [facility] in the past and would like them again. PT has recommended HH PT. Pt is agreeable. Romualdo Bolk, of [facility], aware of referral and will obtain pt info from chart. Pt informed HH has 48 hours to make first visit from time of DC. Anticipate DC home 24-48 hours. No further CM needs identified.  Sherald Barge, RN 08/23/2014, 2:38 PM

## 2014-08-24 DIAGNOSIS — N179 Acute kidney failure, unspecified: Secondary | ICD-10-CM | POA: Insufficient documentation

## 2014-08-24 DIAGNOSIS — E875 Hyperkalemia: Secondary | ICD-10-CM

## 2014-08-24 DIAGNOSIS — N390 Urinary tract infection, site not specified: Secondary | ICD-10-CM

## 2014-08-24 LAB — CBC
HCT: 25.8 % — ABNORMAL LOW (ref 36.0–46.0)
Hemoglobin: 8.7 g/dL — ABNORMAL LOW (ref 12.0–15.0)
MCH: 30 pg (ref 26.0–34.0)
MCHC: 33.7 g/dL (ref 30.0–36.0)
MCV: 89 fL (ref 78.0–100.0)
Platelets: 239 10*3/uL (ref 150–400)
RBC: 2.9 MIL/uL — ABNORMAL LOW (ref 3.87–5.11)
RDW: 14.1 % (ref 11.5–15.5)
WBC: 3.3 10*3/uL — ABNORMAL LOW (ref 4.0–10.5)

## 2014-08-24 LAB — BASIC METABOLIC PANEL
Anion gap: 4 — ABNORMAL LOW (ref 5–15)
BUN: 17 mg/dL (ref 6–20)
CO2: 23 mmol/L (ref 22–32)
Calcium: 8 mg/dL — ABNORMAL LOW (ref 8.9–10.3)
Chloride: 114 mmol/L — ABNORMAL HIGH (ref 101–111)
Creatinine, Ser: 1 mg/dL (ref 0.44–1.00)
GFR calc Af Amer: 58 mL/min — ABNORMAL LOW (ref >60)
GFR calc non Af Amer: 50 mL/min — ABNORMAL LOW (ref >60)
Glucose, Bld: 90 mg/dL (ref 65–99)
Potassium: 3.6 mmol/L (ref 3.5–5.1)
Sodium: 141 mmol/L (ref 135–145)

## 2014-08-24 LAB — HEMOGLOBIN A1C
Hgb A1c MFr Bld: 5.9 % — ABNORMAL HIGH (ref 4.8–5.6)
Mean Plasma Glucose: 123 mg/dL

## 2014-08-24 NOTE — Progress Notes (Signed)
NURSING PROGRESS NOTE  Renee Blake [id number] Discharge Data: 08/24/2014 3:14 PM Attending Provider: Samuella Cota, MD RTM:YTRZ, [facility], MD   [name] to be D/C'd Home per MD order.    All IV's discontinued and monitored for bleeding.  All belongings returned to patient for patient to take home.  AVS summary reviewed with patient and her daughter.   Cardiac monitor discontinued.  Patient left floor via wheelchair, escorted by patient advocate.  Last Documented Vital Signs:  Blood pressure 128/51, pulse 68, temperature 97.7 F (36.5 C), temperature source Oral, resp. rate 16, height 5\' 6"  (1.676 m), weight 52.028 kg (114 lb 11.2 oz), SpO2 100 %.  [name] D

## 2014-08-24 NOTE — Progress Notes (Signed)
PT Cancellation Note  Patient Details Name: Renee Blake MRN: [id number] DOB: 21-Jan-1929   Cancelled Treatment:    Reason Eval/Treat Not Completed: Patient declined, no reason specified Pt in bed upon entrance with lunch on table.  Pt stated she had been sitting up all morning and requested to eat lunch in bed.  No reports of pain or discomfort.  Will try again later is available.   [address] [place]; [place] #[address] [phone]  Aldona Lento 08/24/2014, 12:55 PM

## 2014-08-24 NOTE — Plan of Care (Signed)
Problem: Consults Goal: ARF/New Onset CRF Patient Education See Patient Education Module for education specifics.  Outcome: Completed/Met Date Met:  08/24/14 Handout given and discussed with patient and daughter.

## 2014-08-24 NOTE — Discharge Instructions (Signed)
Acute Kidney Injury Acute kidney injury is a disease in which there is sudden (acute) damage to the kidneys. The kidneys are 2 organs that lie on either side of the spine between the middle of the back and the front of the abdomen. The kidneys:  Remove wastes and extra water from the blood.   Produce important hormones. These help keep bones strong, regulate blood pressure, and help create red blood cells.   Balance the fluids and chemicals in the blood and tissues. A small amount of kidney damage may not cause problems, but a large amount of damage may make it difficult or impossible for the kidneys to work the way they should. Acute kidney injury may develop into long-lasting (chronic) kidney disease. It may also develop into a life-threatening disease called end-stage kidney disease. Acute kidney injury can get worse very quickly, so it should be treated right away. Early treatment may prevent other kidney diseases from developing.  CAUSES   A problem with blood flow to the kidneys. This may be caused by:   Blood loss.   Heart disease.   Severe burns.   Liver disease.  Direct damage to the kidneys. This may be caused by:  Some medicines.   A kidney infection.   Poisoning or consuming toxic substances.   A surgical wound.   A blow to the kidney area.   A problem with urine flow. This may be caused by:   Cancer.   Kidney stones.   An enlarged prostate. SYMPTOMS   Swelling (edema) of the legs, ankles, or feet.   Tiredness (lethargy).   Nausea or vomiting.   Confusion.   Problems with urination, such as:   Painful or burning feeling during urination.   Decreased urine production.   Frequent accidents in children who are potty trained.   Bloody urine.   Muscle twitches and cramps.   Shortness of breath.   Seizures.   Chest pain or pressure. Sometimes, no symptoms are present. DIAGNOSIS Acute kidney injury may be detected  and diagnosed by tests, including blood, urine, imaging, or kidney biopsy tests.  TREATMENT Treatment of acute kidney injury varies depending on the cause and severity of the kidney damage. In mild cases, no treatment may be needed. The kidneys may heal on their own. If acute kidney injury is more severe, your caregiver will treat the cause of the kidney damage, help the kidneys heal, and prevent complications from occurring. Severe cases may require a procedure to remove toxic wastes from the body (dialysis) or surgery to repair kidney damage. Surgery may involve:   Repair of a torn kidney.   Removal of an obstruction. Most of the time, you will need to stay overnight at the hospital.  HOME CARE INSTRUCTIONS:  Follow your prescribed diet.  Only take over-the-counter or prescription medicines as directed by your caregiver.  Do not take any new medicines (prescription, over-the-counter, or nutritional supplements) unless approved by your caregiver. Many medicines can worsen your kidney damage or need to have the dose adjusted.   Keep all follow-up appointments as directed by your caregiver.  Observe your condition to make sure you are healing as expected. SEEK IMMEDIATE MEDICAL CARE IF:  You are feeling ill or have severe pain in the back or side.   Your symptoms return or you have new symptoms.  You have any symptoms of end-stage kidney disease. These include:   Persistent itchiness.   Loss of appetite.   Headaches.   Abnormally dark   or light skin.  Numbness in the hands or feet.   Easy bruising.   Frequent hiccups.   Menstruation stops.   You have a fever.  You have increased urine production.  You have pain or bleeding when urinating. MAKE SURE YOU:   Understand these instructions.  Will watch your condition.  Will get help right away if you are not doing well or get worse Document Released: 10/01/2010 Document Revised: 07/13/2012 Document  Reviewed: 11/15/2011 ExitCare Patient Information 2015 [employer]. This information is not intended to replace advice given to you by your health care provider. Make sure you discuss any questions you have with your health care provider.  

## 2014-08-24 NOTE — Care Management Note (Signed)
Case Management Note  Patient Details  Name: Cuma Polyakov MRN: [id number] Date of Birth: [birth year]  Subjective/Objective:                    Action/Plan:   Expected Discharge Date:                  Expected Discharge Plan:  [place]  In-House Referral:  NA  Discharge planning Services  CM Consult  Post Acute Care Choice:  Home Health Choice offered to:  Patient  DME Arranged:    DME Agency:     HH Arranged:  PT [place]:  [place]  Status of Service:  Completed, signed off  Medicare Important Message Given:  N/A - LOS <3 / Initial given by admissions Date Medicare IM Given:    Medicare IM give by:    Date Additional Medicare IM Given:    Additional Medicare Important Message give by:     If discussed at [place] of Stay Meetings, dates discussed:    Additional Comments: Pt discharged home today with [facility] PT (per pts choice). Romualdo Bolk of [facility] is aware and will collect the pts information from the chart. Hh services to start within 48 hours. No DME needs noted. Pt and pts nurse aware of discharge arrangements. Christinia Gully [place], RN 08/24/2014, 1:30 PM

## 2014-08-24 NOTE — Discharge Summary (Signed)
Physician Discharge Summary  Ramonita Koenig NLG:[id number] DOB: 06-22-1928 DOA: 08/22/2014  PCP: Delphina Cahill, MD  Admit date: 08/22/2014 Discharge date: 08/24/2014  Time spent: 40 minutes minutes  Recommendations for Outpatient Follow-up:  1. Follow up with Dr  N[place] Crane 08/30/14 for evaluation of BP. Also recommend BMET to evaluate kidney function and electrolytes, and ensure resolution of UTI 2. HH PT for strength and endurence  Discharge Diagnoses:  Principal Problem:   Renal failure Active Problems:   Diarrhea   Dehydration   Generalized weakness   Diabetes mellitus   Hyperkalemia   UTI (urinary tract infection)   Acute renal failure   Metabolic acidosis   Acute renal failure syndrome   Discharge Condition: stable   Diet recommendation: regular  Filed Weights   08/22/14 1451 08/23/14 0430 08/24/14 0429  Weight: 49.6 kg (109 lb 5.6 oz) 51.6 kg (113 lb 12.1 oz) 52.028 kg (114 lb 11.2 oz)    History of present illness:  Adamari Frede is a very pleasant 81 y.o. female with a past medical history that includes hypertension, chronic diarrhea, duodenal ulcer, diabetes, presented to the emergency Department on 08/22/14 with chief complaint of diarrhea. Initial evaluation revealed a urinary tract infection, acute renal failure, dehydration and hyperkalemia.  Patient reported chronic diarrhea worsening over the previous several weeks. She stated her primary care provider manages her diarrhea with lab work every 3 months. Last visit adjustments were made to her blood pressure medication. She had lab work done 4 days prior to admission and was scheduled for follow-up day after admission. Presented  with diarrhea she developed abdominal pain in her lower quadrants described as cramp like rated 7 out of 10, nausea with no vomiting, dizziness, generalized weakness, near syncope. She denied bright red blood per rectum or melena. He denied headache visual disturbances numbness or tingling of  extremities. She denied chest pain palpitations shortness of breath. She did report some frequency and "not making enough urine". He denied dysuria hematuria.  Workup in the emergency department included complete metabolic panel significant for potassium of 6.7 chloride 116 BUN 37 creatinine 2.76, complete blood count is unremarkable, urinalysis positive nitrite moderate leukocytes a bacteria, EKG with sinus rhythm abnormal P wave.  The emergency department she was afebrile hemodynamically stable with a blood pressure on the low end of normal not hypoxic. In the emergency department she is provided with IV fluids and IV insulin glucose and albuterol.  Hospital Course:  Renal failure: Likely multifactorial i.e. urinary tract infection resulting in dehydration in the setting of ACE inhibitor, Lasix and Aldactone. Resolved with gentle IV fluids and holding ACE inhibitor, lasix, aldactone. At discharge resumed low dose lasix but stopped ACE inhibitor and Aldactone. Urine output good. Potassium level within limits of normal at discharge. Recommend BMET 1 week.   Active Problems: UTI (urinary tract infection): received 3 days rocephin. Urine culture with greater than 100,000 colonies gram-negative rods. She remained afebrile and non-toxic appearing.  Hyperkalemia: Related to #1. Resolved.  Dehydration: resolved at discharge   Generalized weakness: Home health PT   Diarrhea: Chronic and stable at discharge.  Home medications include Imodium and Questran. cdiff negative    Diabetes mellitus: Diet controlled.  A1c 5.9.  Hypertension: controlled. At discharge will discontinue atenolol, aldactone and lisinopril. Continue lasix.      Procedures:  none  Consultations:  none  Discharge Exam: Filed Vitals:   08/24/14 0429  BP: 128/51  Pulse: 68  Temp: 97.7 F (36.5 C)  Resp: 16  General: well nourished appears comfortable Cardiovascular: RRR n MGR No LE edema Respiratory:  normal effort BS clear bilaterally no wheeze Abdomen: soft non-distended +BS non-tender  Discharge Instructions   Discharge Instructions    Diet - low sodium heart healthy    Complete by:  As directed      Discharge instructions    Complete by:  As directed   Follow up with PCP as scheduled. Take medications as directed     Increase activity slowly    Complete by:  As directed           Current Discharge Medication List    CONTINUE these medications which have NOT CHANGED   Details  amLODipine (NORVASC) 5 MG tablet Take 5 mg by mouth daily.    aspirin EC 81 MG tablet Take 81 mg by mouth daily.    cholestyramine (QUESTRAN) 4 GM/DOSE powder Take 4 g by mouth 2 (two) times daily with a meal.  Refills: 2    furosemide (LASIX) 20 MG tablet Take 20 mg by mouth daily. Refills: 3    loperamide (IMODIUM) 2 MG capsule Take 2 mg by mouth every morning. Refills: 5    lovastatin (MEVACOR) 20 MG tablet Take 20 mg by mouth at bedtime.    montelukast (SINGULAIR) 10 MG tablet Take 10 mg by mouth at bedtime.    pantoprazole (PROTONIX) 40 MG tablet Take 40 mg by mouth every morning.     potassium chloride (K-DUR,KLOR-CON) 10 MEQ tablet Take 10-20 mEq by mouth 2 (two) times daily. Take 2 tablet in the morning and 1 tablet in the evening    meclizine (ANTIVERT) 25 MG tablet Take 25 mg by mouth 3 (three) times daily as needed for dizziness.  Refills: 3      STOP taking these medications     atenolol (TENORMIN) 50 MG tablet      lisinopril (PRINIVIL,ZESTRIL) 40 MG tablet      spironolactone (ALDACTONE) 25 MG tablet        Allergies  Allergen Reactions  . Codeine Other (See Comments)    Reaction unknown  . Sulfa Antibiotics Other (See Comments)    Reactions unknown   Follow-up Information    Follow up with [facility].   Contact information:   [address] [place] [address] [phone]       Follow up with Delphina Cahill, MD On 08/30/2014.    Specialty:  Internal Medicine   Why:  at  3:20 pm   Contact information:    [place] [address] [phone]        The results of significant diagnostics from this hospitalization (including imaging, microbiology, ancillary and laboratory) are listed below for reference.    Significant Diagnostic Studies: No results found.  Microbiology: Recent Results (from the past 240 hour(s))  Culture, Urine     Status: None (Preliminary result)   Collection Time: 08/22/14  1:09 PM  Result Value Ref Range Status   Specimen Description URINE, CLEAN CATCH  Final   Special Requests NONE  Final   Colony Count   Final    >=100,000 COLONIES/ML Performed at [facility]    Culture   Final    [place] Performed at [facility]    Report Status PENDING  Incomplete  MRSA PCR Screening     Status: None   Collection Time: 08/22/14  2:50 PM  Result Value Ref Range Status  MRSA by PCR NEGATIVE NEGATIVE Final    Comment:        The GeneXpert MRSA Assay (FDA approved for NASAL specimens only), is one component of a comprehensive MRSA colonization surveillance program. It is not intended to diagnose MRSA infection nor to guide or monitor treatment for MRSA infections.   Clostridium Difficile by PCR     Status: None   Collection Time: 08/23/14  6:30 AM  Result Value Ref Range Status   C difficile by pcr NEGATIVE NEGATIVE Final     Labs: Basic Metabolic Panel:  Recent Labs Lab 08/22/14 1123 08/22/14 1134 08/22/14 2000 08/23/14 0352 08/24/14 0603  NA  --  136  --  142 141  K  --  6.7* 5.3* 4.9 3.6  CL  --  116*  --  126* 114*  CO2  --  13*  --  12* 23  GLUCOSE  --  101*  --  81 90  BUN  --  37*  --  29* 17  CREATININE  --  2.76*  --  1.73* 1.00  CALCIUM  --  9.3  --  8.4* 8.0*  MG 2.0  --   --   --   --    Liver Function Tests:  Recent Labs Lab 08/22/14 1134 08/23/14 0352  AST 12* 12*  ALT 9* 6*  ALKPHOS 79 67  BILITOT 0.3  0.3  PROT 7.1 5.4*  ALBUMIN 3.9 3.0*    Recent Labs Lab 08/22/14 1134  LIPASE 27   No results for input(s): AMMONIA in the last 168 hours. CBC:  Recent Labs Lab 08/22/14 1134 08/23/14 0352 08/24/14 0603  WBC 8.3 4.6 3.3*  NEUTROABS 5.9  --   --   HGB 11.8* 9.0* 8.7*  HCT 36.8 27.9* 25.8*  MCV 90.4 91.8 89.0  PLT 331 242 239   Cardiac Enzymes: No results for input(s): CKTOTAL, CKMB, CKMBINDEX, TROPONINI in the last 168 hours. BNP: BNP (last 3 results) No results for input(s): BNP in the last 8760 hours.  ProBNP (last 3 results)  Recent Labs  09/16/13 1529 11/21/13 1254 03/15/14 1818  PROBNP 1532.0* 708.6* 1121.0*    CBG: No results for input(s): GLUCAP in the last 168 hours.     SignedRadene Gunning  [facility] 08/24/2014, 1:31 PM

## 2014-08-25 LAB — URINE CULTURE: Colony Count: >=100000

## 2014-08-27 LAB — STOOL CULTURE

## 2014-10-11 ENCOUNTER — Encounter (INDEPENDENT_AMBULATORY_CARE_PROVIDER_SITE_OTHER): Payer: Self-pay | Admitting: *Deleted

## 2014-10-27 ENCOUNTER — Emergency Department (HOSPITAL_COMMUNITY): Payer: Medicare Other

## 2014-10-27 ENCOUNTER — Encounter (HOSPITAL_COMMUNITY): Payer: Self-pay | Admitting: Emergency Medicine

## 2014-10-27 ENCOUNTER — Emergency Department (HOSPITAL_COMMUNITY)
Admission: EM | Admit: 2014-10-27 | Discharge: 2014-10-28 | Disposition: A | Discharge status: Home / Self Care | Payer: Medicare Other | Attending: Emergency Medicine | Admitting: Emergency Medicine

## 2014-10-27 DIAGNOSIS — R0602 Shortness of breath: Secondary | ICD-10-CM | POA: Diagnosis present

## 2014-10-27 DIAGNOSIS — R110 Nausea: Secondary | ICD-10-CM | POA: Insufficient documentation

## 2014-10-27 DIAGNOSIS — R2240 Localized swelling, mass and lump, unspecified lower limb: Secondary | ICD-10-CM | POA: Diagnosis not present

## 2014-10-27 DIAGNOSIS — Z85828 Personal history of other malignant neoplasm of skin: Secondary | ICD-10-CM | POA: Diagnosis not present

## 2014-10-27 DIAGNOSIS — R0989 Other specified symptoms and signs involving the circulatory and respiratory systems: Secondary | ICD-10-CM | POA: Diagnosis not present

## 2014-10-27 DIAGNOSIS — Q394 Esophageal web: Secondary | ICD-10-CM | POA: Diagnosis not present

## 2014-10-27 DIAGNOSIS — R079 Chest pain, unspecified: Secondary | ICD-10-CM | POA: Diagnosis not present

## 2014-10-27 DIAGNOSIS — R0600 Dyspnea, unspecified: Secondary | ICD-10-CM | POA: Diagnosis not present

## 2014-10-27 DIAGNOSIS — E785 Hyperlipidemia, unspecified: Secondary | ICD-10-CM | POA: Insufficient documentation

## 2014-10-27 DIAGNOSIS — Z8679 Personal history of other diseases of the circulatory system: Secondary | ICD-10-CM | POA: Insufficient documentation

## 2014-10-27 DIAGNOSIS — Z8719 Personal history of other diseases of the digestive system: Secondary | ICD-10-CM | POA: Insufficient documentation

## 2014-10-27 DIAGNOSIS — Z7982 Long term (current) use of aspirin: Secondary | ICD-10-CM | POA: Diagnosis not present

## 2014-10-27 DIAGNOSIS — Z87442 Personal history of urinary calculi: Secondary | ICD-10-CM | POA: Diagnosis not present

## 2014-10-27 DIAGNOSIS — Z862 Personal history of diseases of the blood and blood-forming organs and certain disorders involving the immune mechanism: Secondary | ICD-10-CM | POA: Diagnosis not present

## 2014-10-27 DIAGNOSIS — Z79899 Other long term (current) drug therapy: Secondary | ICD-10-CM | POA: Insufficient documentation

## 2014-10-27 DIAGNOSIS — R51 Headache: Secondary | ICD-10-CM | POA: Diagnosis not present

## 2014-10-27 DIAGNOSIS — I10 Essential (primary) hypertension: Secondary | ICD-10-CM | POA: Diagnosis not present

## 2014-10-27 DIAGNOSIS — E119 Type 2 diabetes mellitus without complications: Secondary | ICD-10-CM | POA: Diagnosis not present

## 2014-10-27 LAB — CBC WITH DIFFERENTIAL/PLATELET
Basophils Absolute: 0 10*3/uL (ref 0.0–0.1)
Basophils Relative: 0 % (ref 0–1)
Eosinophils Absolute: 0.2 10*3/uL (ref 0.0–0.7)
Eosinophils Relative: 2 % (ref 0–5)
HCT: 33.9 % — ABNORMAL LOW (ref 36.0–46.0)
Hemoglobin: 11.2 g/dL — ABNORMAL LOW (ref 12.0–15.0)
Lymphocytes Relative: 20 % (ref 12–46)
Lymphs Abs: 1.6 10*3/uL (ref 0.7–4.0)
MCH: 28.8 pg (ref 26.0–34.0)
MCHC: 33 g/dL (ref 30.0–36.0)
MCV: 87.1 fL (ref 78.0–100.0)
Monocytes Absolute: 0.7 10*3/uL (ref 0.1–1.0)
Monocytes Relative: 9 % (ref 3–12)
Neutro Abs: 5.5 10*3/uL (ref 1.7–7.7)
Neutrophils Relative %: 69 % (ref 43–77)
Platelets: 271 10*3/uL (ref 150–400)
RBC: 3.89 MIL/uL (ref 3.87–5.11)
RDW: 12.6 % (ref 11.5–15.5)
WBC: 8 10*3/uL (ref 4.0–10.5)

## 2014-10-27 IMAGING — DX DG CHEST 2V
2 series · 2 of 2 positions shown · non-contrast
Comparison: [DATE]

CLINICAL DATA: Shortness of breath, weakness, bilateral lower
extremity swelling and nausea for several hours.

EXAM:
CHEST  2 VIEW

[chest lat]
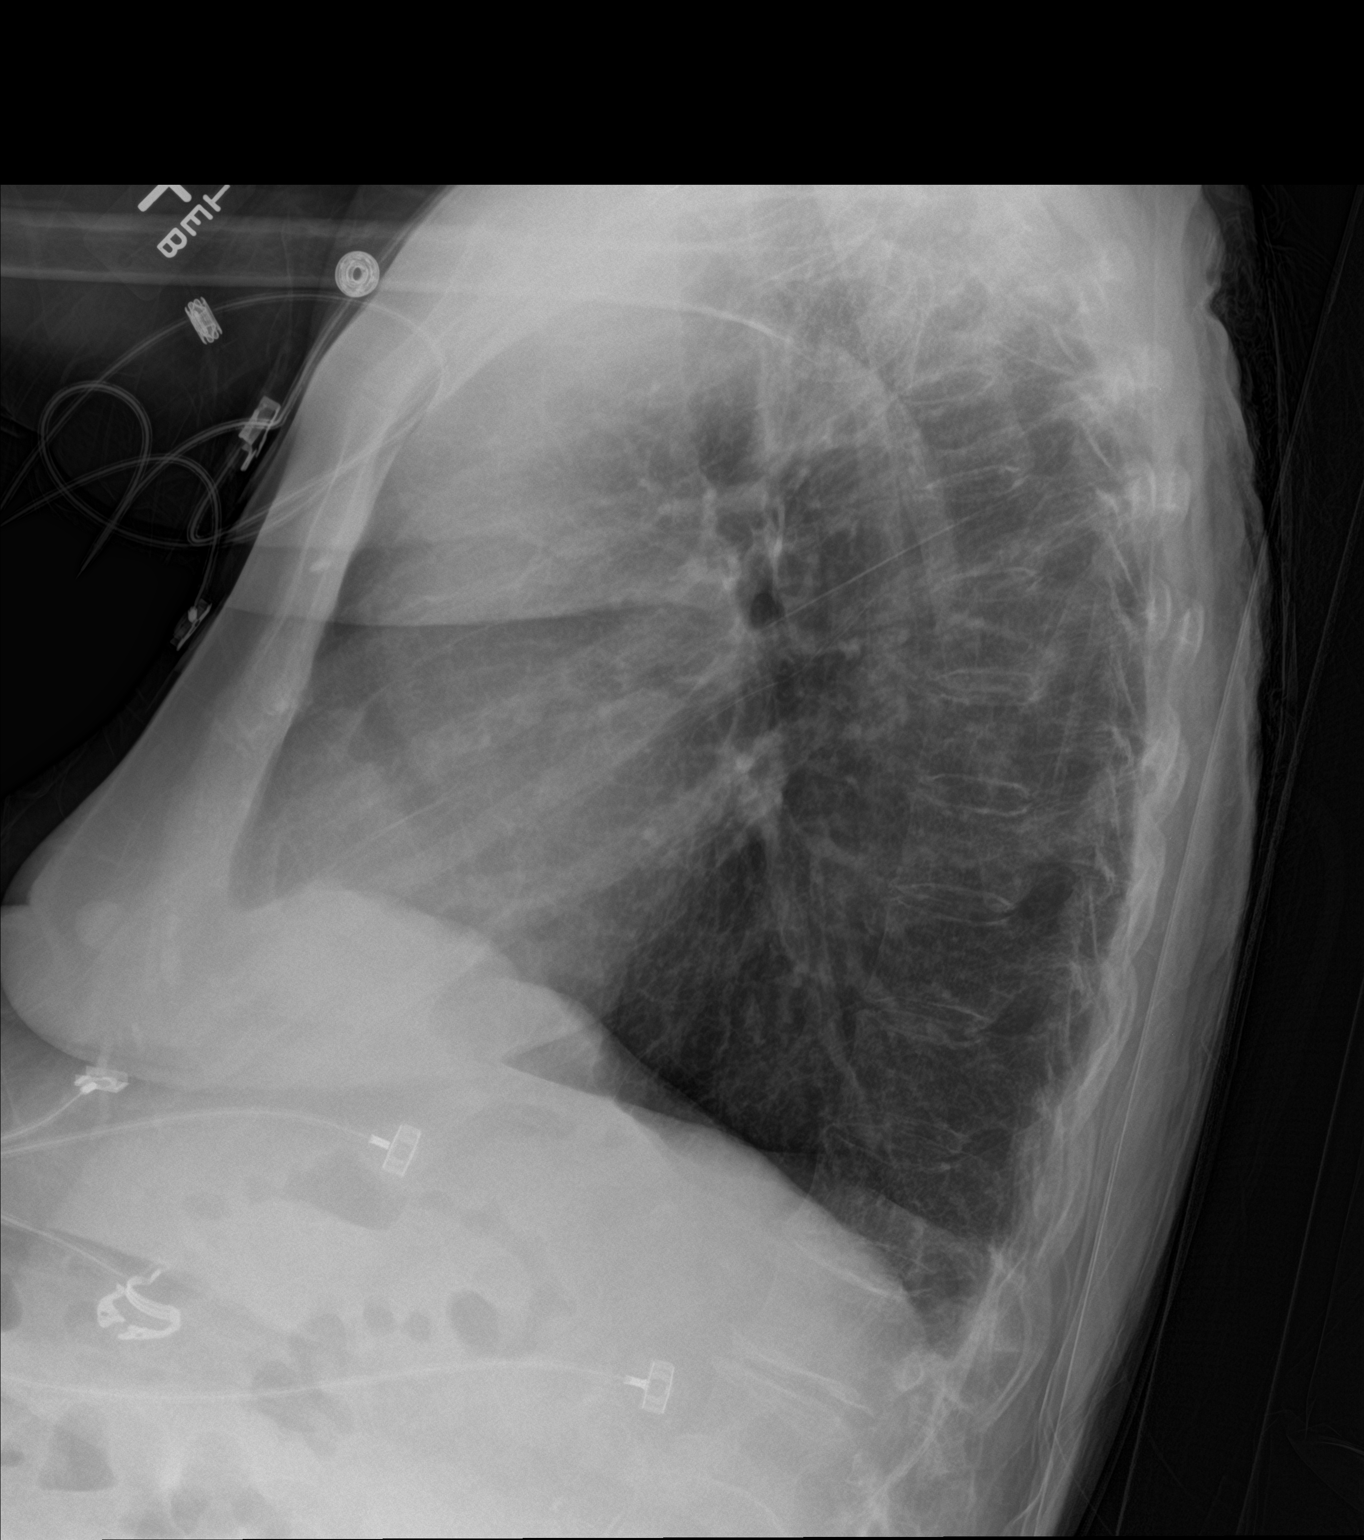

[chest ap strecther]
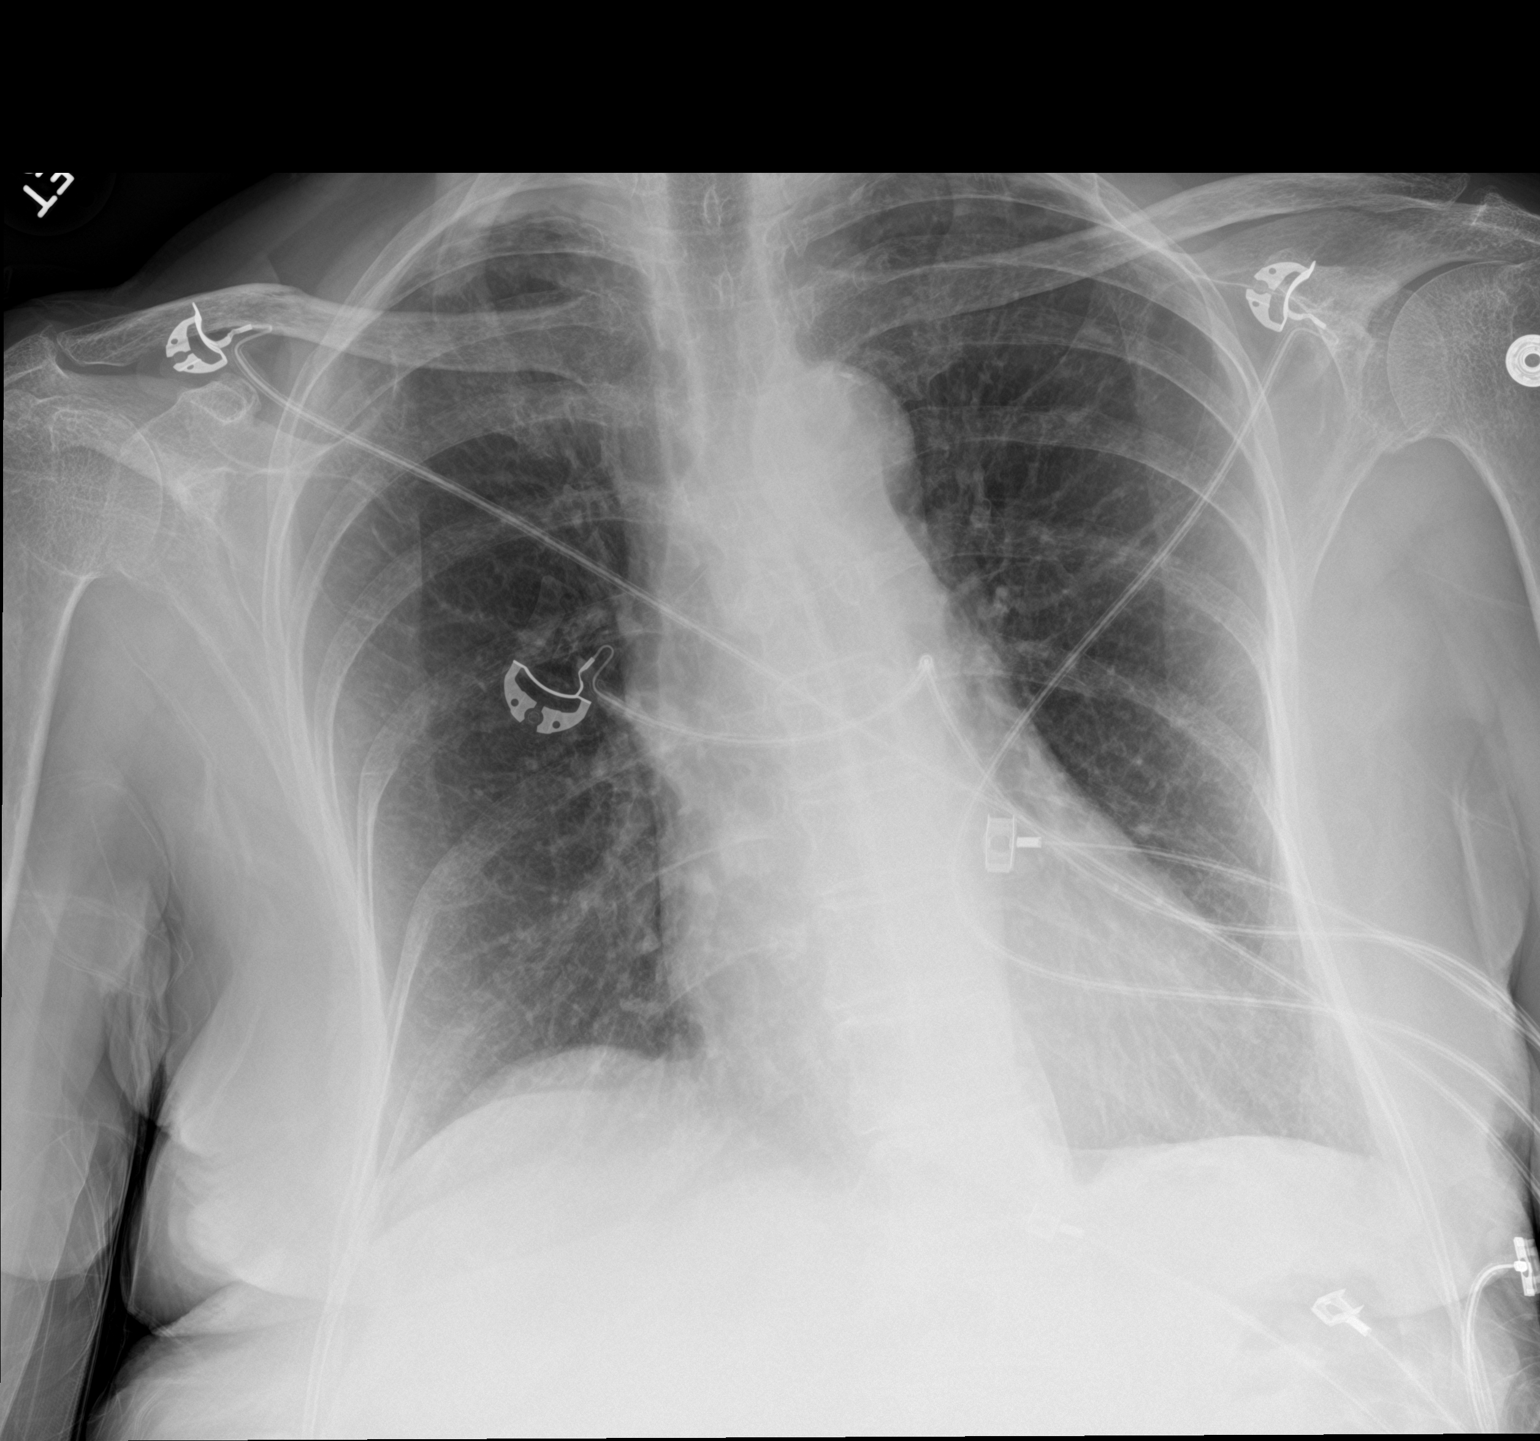

[2 of 2 positions shown; findings below may reference images not displayed]

FINDINGS: The lungs are mildly hyperinflated. The cardiomediastinal contours
are unchanged with stable mild cardiomegaly. No pulmonary edema. No
consolidation, pleural effusion, or pneumothorax. No acute osseous
abnormalities are seen.
IMPRESSION: 1. Mild hyperinflation, may reflect bronchitis.
2. Stable cardiomegaly without congestive failure.

## 2014-10-27 MED ORDER — ALBUTEROL SULFATE (2.5 MG/3ML) 0.083% IN NEBU: 5.0000 mg | INHALATION_SOLUTION | Freq: Once | RESPIRATORY_TRACT | Status: DC

## 2014-10-27 MED ORDER — FUROSEMIDE 10 MG/ML IJ SOLN
40.0000 mg | Freq: Once | INTRAMUSCULAR | Status: DC
  Filled 2014-10-27: qty 4

## 2014-10-27 NOTE — ED Notes (Signed)
Pt started having increased SOB and swelling in legs.  Has hx of CHF

## 2014-10-27 NOTE — ED Notes (Signed)
Pt stated she took double dose of her prescription lasix at home due to swelling in feet today. Refused ordered IV lasix. EDP notified.

## 2014-10-27 NOTE — ED Provider Notes (Signed)
CSN: [id number]   Arrival date & time 10/27/14 2151  History  This chart was scribed for  Renee Speak, MD by Altamease Oiler, ED Scribe. This patient was seen in room APA08/APA08 and the patient's care was started at 11:19 PM.  Chief Complaint  Patient presents with  . Shortness of Breath  . Leg Swelling    HPI The history is provided by the patient and a relative. No language interpreter was used.   Renee Blake is a 81 y.o. female with PMHx of CHF who presents to the Emergency Department complaining of constant SOB with gradual onset around 12 PM today. No modifying factors. Associated symptoms include headache, chest pain, nausea, and LE swelling with no improvement after Lasix. Her feet swell daily but the swelling usually improves after Lasix. Pt denies fever, cough, and abdominal pain.   Past Medical History  Diagnosis Date  . Hypertension   . Hyperlipemia   . Gastric ulcer   . Chronic diarrhea   . Vertigo   . Sliding hiatal hernia   . Schatzki's ring   . Duodenal stricture   . GI bleed   . Microscopic colitis   . Acute blood loss anemia   . Dilation of biliary tract     biliary duct per 02/26/13 CT  . Pancreatic duct dilated 02/26/13  . Esophageal stricture   . Diabetes   . Skin cancer   . Nephrolithiasis     Past Surgical History  Procedure Laterality Date  . Nose surgery      for skin cancer  . Esophagogastroduodenoscopy N/A 02/27/2013    Procedure: ESOPHAGOGASTRODUODENOSCOPY (EGD);  Surgeon: Ladene Artist, MD;  Location: [facility] ENDOSCOPY;  Service: Endoscopy;  Laterality: N/A;  . Hot hemostasis N/A 02/27/2013    Procedure: HOT HEMOSTASIS (ARGON PLASMA COAGULATION/BICAP);  Surgeon: Ladene Artist, MD;  Location: [facility] ENDOSCOPY;  Service: Endoscopy;  Laterality: N/A;  . Esophagogastroduodenoscopy N/A 03/28/2013    Procedure: ESOPHAGOGASTRODUODENOSCOPY (EGD);  Surgeon: Rogene Houston, MD;  Location: [facility] ENDO SUITE;  Service: Endoscopy;  Laterality: N/A;    Family  History  Problem Relation Age of Onset  . Heart attack Father     History  Substance Use Topics  . Smoking status: Never Smoker   . Smokeless tobacco: Never Used  . Alcohol Use: No     Review of Systems 10 Systems reviewed and all are negative for acute change except as noted in the HPI.    Home Medications   Prior to Admission medications   Medication Sig Start Date End Date Taking? Authorizing Provider  amLODipine (NORVASC) 5 MG tablet Take 5 mg by mouth daily.   Yes Historical Provider, MD  aspirin EC 81 MG tablet Take 81 mg by mouth daily.   Yes Historical Provider, MD  cholestyramine Lucrezia Starch) 4 GM/DOSE powder Take 4 g by mouth daily as needed (for constipation).  01/12/14  Yes Historical Provider, MD  furosemide (LASIX) 20 MG tablet Take 20 mg by mouth daily. 03/08/14  Yes Historical Provider, MD  loperamide (IMODIUM) 2 MG capsule Take 2 mg by mouth every morning. 02/26/14  Yes Historical Provider, MD  meclizine (ANTIVERT) 25 MG tablet Take 25 mg by mouth 3 (three) times daily as needed for dizziness.  03/15/14  Yes Historical Provider, MD  montelukast (SINGULAIR) 10 MG tablet Take 10 mg by mouth at bedtime.   Yes Historical Provider, MD  pantoprazole (PROTONIX) 40 MG tablet Take 40 mg by mouth every morning.  Yes Historical Provider, MD  potassium chloride (K-DUR,KLOR-CON) 10 MEQ tablet Take 10-20 mEq by mouth 2 (two) times daily. Take 2 tablet in the morning and 1 tablet in the evening   Yes Historical Provider, MD    Allergies  Codeine and Sulfa antibiotics  Triage Vitals: BP 156/74 mmHg  Pulse 68  Temp(Src) 97.5 F (36.4 C) (Oral)  Resp 16  Ht 5\' 6"  (1.676 m)  Wt 110 lb (49.896 kg)  BMI 17.76 kg/m2  SpO2 98%  Physical Exam  Constitutional: She is oriented to person, place, and time. She appears well-developed and well-nourished. No distress.  HENT:  Head: Normocephalic and atraumatic.  Eyes: EOM are normal. Pupils are equal, round, and reactive to light.   Neck: Neck supple.  Cardiovascular: Normal rate and regular rhythm.   No murmur heard. Pulmonary/Chest: Effort normal. No respiratory distress. She has no wheezes. She has rales.  Slight rales in the bases bilaterally  Abdominal: Soft. Bowel sounds are normal. She exhibits no distension. There is no tenderness.  Musculoskeletal: Normal range of motion. She exhibits edema.  Trace edema of the BLE  Neurological: She is alert and oriented to person, place, and time. No cranial nerve deficit.  Skin: Skin is warm and dry. No rash noted. She is not diaphoretic.  Psychiatric: She has a normal mood and affect. Her behavior is normal.  Nursing note and vitals reviewed.   ED Course  Procedures   DIAGNOSTIC STUDIES: Oxygen Saturation is 98% on RA, normal by my interpretation.    COORDINATION OF CARE: 11:24 PM Discussed treatment plan which includes CXR, EKG, lab work, and a breathing treatment with pt at bedside and pt agreed to plan.  Labs Review-  Labs Reviewed  COMPREHENSIVE METABOLIC PANEL - Abnormal; Notable for the following:    Sodium 130 (*)    Potassium 3.3 (*)    Chloride 95 (*)    Glucose, Bld 106 (*)    Calcium 8.5 (*)    All other components within normal limits  CBC WITH DIFFERENTIAL/PLATELET - Abnormal; Notable for the following:    Hemoglobin 11.2 (*)    HCT 33.9 (*)    All other components within normal limits  TROPONIN I  BRAIN NATRIURETIC PEPTIDE    Imaging Review Dg Chest 2 View  10/27/2014   CLINICAL DATA:  Shortness of breath, weakness, bilateral lower extremity swelling and nausea for several hours.  EXAM: CHEST  2 VIEW  COMPARISON:  03/15/2014  FINDINGS: The lungs are mildly hyperinflated. The cardiomediastinal contours are unchanged with stable mild cardiomegaly. No pulmonary edema. No consolidation, pleural effusion, or pneumothorax. No acute osseous abnormalities are seen.  IMPRESSION: 1. Mild hyperinflation, may reflect bronchitis. 2. Stable cardiomegaly  without congestive failure.   Electronically Signed   By: Jeb Levering M.D.   On: 10/27/2014 23:16      ED ECG REPORT   Date: 10/28/2014  Rate: 70's  Rhythm: normal sinus rhythm  QRS Axis: left  Intervals: normal  ST/T Wave abnormalities: normal  Conduction Disutrbances:none  Narrative Interpretation:   Old EKG Reviewed: unchanged  I have personally reviewed the EKG tracing and agree with the computerized printout as noted.      MDM   Final diagnoses:  None     Patient presents here with complaints of dyspnea. She has a history of congestive heart failure, however today she has a normal BNP and clear chest x-ray. She has no significant leg edema and I highly doubt this is  a flareup of congestive heart failure. There is no evidence for pneumonia and I highly doubt pulmonary embolism. Her oxygen saturations are 100% and heart rate is in the 60s. I believe she is appropriate for discharge. She will have follow-up with her primary Dr. in the next few days and return if her symptoms significantly worsen or change.   I personally performed the services described in this documentation, which was scribed in my presence. The recorded information has been reviewed and is accurate.     Renee Speak, MD 10/28/14 [phone]

## 2014-10-28 LAB — COMPREHENSIVE METABOLIC PANEL
ALT: 30 U/L (ref 14–54)
AST: 31 U/L (ref 15–41)
Albumin: 4 g/dL (ref 3.5–5.0)
Alkaline Phosphatase: 92 U/L (ref 38–126)
Anion gap: 11 (ref 5–15)
BUN: 10 mg/dL (ref 6–20)
CO2: 24 mmol/L (ref 22–32)
Calcium: 8.5 mg/dL — ABNORMAL LOW (ref 8.9–10.3)
Chloride: 95 mmol/L — ABNORMAL LOW (ref 101–111)
Creatinine, Ser: 0.79 mg/dL (ref 0.44–1.00)
GFR calc Af Amer: >60 mL/min (ref >60)
GFR calc non Af Amer: >60 mL/min (ref >60)
Glucose, Bld: 106 mg/dL — ABNORMAL HIGH (ref 65–99)
Potassium: 3.3 mmol/L — ABNORMAL LOW (ref 3.5–5.1)
Sodium: 130 mmol/L — ABNORMAL LOW (ref 135–145)
Total Bilirubin: 0.4 mg/dL (ref 0.3–1.2)
Total Protein: 6.9 g/dL (ref 6.5–8.1)

## 2014-10-28 LAB — BRAIN NATRIURETIC PEPTIDE: B Natriuretic Peptide: 64 pg/mL (ref 0.0–100.0)

## 2014-10-28 LAB — TROPONIN I: Troponin I: <0.03 ng/mL (ref <0.031)

## 2014-10-28 NOTE — Discharge Instructions (Signed)
Continue your Lasix as before.  Follow-up with your primary Dr. in the next few days, and return to the ER if symptoms significantly worsen or change.   Shortness of Breath Shortness of breath means you have trouble breathing. It could also mean that you have a medical problem. You should get immediate medical care for shortness of breath. CAUSES   Not enough oxygen in the air such as with high altitudes or a smoke-filled room.  Certain lung diseases, infections, or problems.  Heart disease or conditions, such as angina or heart failure.  Low red blood cells (anemia).  Poor physical fitness, which can cause shortness of breath when you exercise.  Chest or back injuries or stiffness.  Being overweight.  Smoking.  Anxiety, which can make you feel like you are not getting enough air. DIAGNOSIS  Serious medical problems can often be found during your physical exam. Tests may also be done to determine why you are having shortness of breath. Tests may include:  Chest X-rays.  Lung function tests.  Blood tests.  An electrocardiogram (ECG).  An ambulatory electrocardiogram. An ambulatory ECG records your heartbeat patterns over a 24-hour period.  Exercise testing.  A transthoracic echocardiogram (TTE). During echocardiography, sound waves are used to evaluate how blood flows through your heart.  A transesophageal echocardiogram (TEE).  Imaging scans. Your health care provider may not be able to find a cause for your shortness of breath after your exam. In this case, it is important to have a follow-up exam with your health care provider as directed.  TREATMENT  Treatment for shortness of breath depends on the cause of your symptoms and can vary greatly. HOME CARE INSTRUCTIONS   Do not smoke. Smoking is a common cause of shortness of breath. If you smoke, ask for help to quit.  Avoid being around chemicals or things that may bother your breathing, such as paint fumes and  dust.  Rest as needed. Slowly resume your usual activities.  If medicines were prescribed, take them as directed for the full length of time directed. This includes oxygen and any inhaled medicines.  Keep all follow-up appointments as directed by your health care provider. SEEK MEDICAL CARE IF:   Your condition does not improve in the time expected.  You have a hard time doing your normal activities even with rest.  You have any new symptoms. SEEK IMMEDIATE MEDICAL CARE IF:   Your shortness of breath gets worse.  You feel light-headed, faint, or develop a cough not controlled with medicines.  You start coughing up blood.  You have pain with breathing.  You have chest pain or pain in your arms, shoulders, or abdomen.  You have a fever.  You are unable to walk up stairs or exercise the way you normally do. MAKE SURE YOU:  Understand these instructions.  Will watch your condition.  Will get help right away if you are not doing well or get worse. Document Released: 12/11/2000 Document Revised: 03/23/2013 Document Reviewed: 06/03/2011 [facility] Patient Information 2015 [place], [place]. This information is not intended to replace advice given to you by your health care provider. Make sure you discuss any questions you have with your health care provider.

## 2014-10-28 NOTE — ED Notes (Signed)
Discharge instructions given, pt demonstrated teach back and verbal understanding. No concerns voiced.  

## 2014-11-16 ENCOUNTER — Encounter (HOSPITAL_COMMUNITY): Payer: Self-pay

## 2014-11-16 ENCOUNTER — Emergency Department (HOSPITAL_COMMUNITY)
Admission: EM | Admit: 2014-11-16 | Discharge: 2014-11-16 | Disposition: A | Discharge status: Home / Self Care | Payer: Medicare Other | Attending: Emergency Medicine | Admitting: Emergency Medicine

## 2014-11-16 DIAGNOSIS — Z85828 Personal history of other malignant neoplasm of skin: Secondary | ICD-10-CM | POA: Diagnosis not present

## 2014-11-16 DIAGNOSIS — Z87442 Personal history of urinary calculi: Secondary | ICD-10-CM | POA: Insufficient documentation

## 2014-11-16 DIAGNOSIS — Z7982 Long term (current) use of aspirin: Secondary | ICD-10-CM | POA: Diagnosis not present

## 2014-11-16 DIAGNOSIS — Z8639 Personal history of other endocrine, nutritional and metabolic disease: Secondary | ICD-10-CM | POA: Diagnosis not present

## 2014-11-16 DIAGNOSIS — Z79899 Other long term (current) drug therapy: Secondary | ICD-10-CM | POA: Diagnosis not present

## 2014-11-16 DIAGNOSIS — Z8719 Personal history of other diseases of the digestive system: Secondary | ICD-10-CM | POA: Insufficient documentation

## 2014-11-16 DIAGNOSIS — M79605 Pain in left leg: Secondary | ICD-10-CM | POA: Diagnosis present

## 2014-11-16 DIAGNOSIS — I10 Essential (primary) hypertension: Secondary | ICD-10-CM | POA: Insufficient documentation

## 2014-11-16 DIAGNOSIS — Z862 Personal history of diseases of the blood and blood-forming organs and certain disorders involving the immune mechanism: Secondary | ICD-10-CM | POA: Diagnosis not present

## 2014-11-16 DIAGNOSIS — M79604 Pain in right leg: Secondary | ICD-10-CM

## 2014-11-16 LAB — CBC WITH DIFFERENTIAL/PLATELET
Basophils Absolute: 0 10*3/uL (ref 0.0–0.1)
Basophils Relative: 0 % (ref 0–1)
Eosinophils Absolute: 0.1 10*3/uL (ref 0.0–0.7)
Eosinophils Relative: 2 % (ref 0–5)
HCT: 34.9 % — ABNORMAL LOW (ref 36.0–46.0)
Hemoglobin: 11.6 g/dL — ABNORMAL LOW (ref 12.0–15.0)
Lymphocytes Relative: 24 % (ref 12–46)
Lymphs Abs: 1.5 10*3/uL (ref 0.7–4.0)
MCH: 29.3 pg (ref 26.0–34.0)
MCHC: 33.2 g/dL (ref 30.0–36.0)
MCV: 88.1 fL (ref 78.0–100.0)
Monocytes Absolute: 0.6 10*3/uL (ref 0.1–1.0)
Monocytes Relative: 10 % (ref 3–12)
Neutro Abs: 4.1 10*3/uL (ref 1.7–7.7)
Neutrophils Relative %: 64 % (ref 43–77)
Platelets: 292 10*3/uL (ref 150–400)
RBC: 3.96 MIL/uL (ref 3.87–5.11)
RDW: 12.9 % (ref 11.5–15.5)
WBC: 6.4 10*3/uL (ref 4.0–10.5)

## 2014-11-16 LAB — BASIC METABOLIC PANEL
Anion gap: 9 (ref 5–15)
BUN: 10 mg/dL (ref 6–20)
CO2: 25 mmol/L (ref 22–32)
Calcium: 9 mg/dL (ref 8.9–10.3)
Chloride: 103 mmol/L (ref 101–111)
Creatinine, Ser: 1.03 mg/dL — ABNORMAL HIGH (ref 0.44–1.00)
GFR calc Af Amer: 56 mL/min — ABNORMAL LOW (ref >60)
GFR calc non Af Amer: 49 mL/min — ABNORMAL LOW (ref >60)
Glucose, Bld: 94 mg/dL (ref 65–99)
Potassium: 3.4 mmol/L — ABNORMAL LOW (ref 3.5–5.1)
Sodium: 137 mmol/L (ref 135–145)

## 2014-11-16 LAB — MAGNESIUM: Magnesium: 1.9 mg/dL (ref 1.7–2.4)

## 2014-11-16 MED ORDER — TRAMADOL HCL 50 MG PO TABS: 50.0000 mg | ORAL_TABLET | Freq: Four times a day (QID) | ORAL | Status: DC | PRN

## 2014-11-16 MED ORDER — POTASSIUM CHLORIDE CRYS ER 20 MEQ PO TBCR
40.0000 meq | EXTENDED_RELEASE_TABLET | Freq: Once | ORAL | Status: AC
  Administered 2014-11-16: 40 meq via ORAL
  Filled 2014-11-16: qty 2

## 2014-11-16 MED ORDER — TRAMADOL HCL 50 MG PO TABS
50.0000 mg | ORAL_TABLET | Freq: Once | ORAL | Status: AC
  Administered 2014-11-16: 50 mg via ORAL
  Filled 2014-11-16: qty 1

## 2014-11-16 NOTE — ED Notes (Signed)
Pt reports takes lasix daily for fluid retention.  Reports yesterday legs were swollen.  Pt says after taking lasix, the swelling went down but she started to have pain and tingling in both lower legs.

## 2014-11-16 NOTE — Discharge Instructions (Signed)

## 2014-11-16 NOTE — ED Provider Notes (Signed)
CSN: [id number]     Arrival date & time 11/16/14  1554 History   First MD Initiated Contact with Patient 11/16/14 1605     Chief Complaint  Patient presents with  . Leg Pain     (Consider location/radiation/quality/duration/timing/severity/associated sxs/prior Treatment) Patient is a 81 y.o. female presenting with leg pain. The history is provided by the patient.  Leg Pain Location:  Leg Time since incident:  2 days Injury: no   Leg location:  L leg and R leg Pain details:    Quality:  Aching and burning   Radiates to:  Does not radiate   Severity:  Moderate   Onset quality:  Gradual   Duration:  2 days   Timing:  Constant   Progression:  Unchanged Chronicity:  New Prior injury to area:  No Relieved by:  Nothing Worsened by:  Nothing tried Ineffective treatments:  None tried Associated symptoms: tingling   Associated symptoms: no back pain, no decreased ROM, no fever, no muscle weakness, no neck pain and no swelling    81 yo F with a chief complaint of bilateral lower extremity pain. This is a sharp achy pain with some paresthesias. This been going on for the past couple days. Started after she was taking Lasix pills for lower extremity edema. Denies injuries denies low back pain denies weakness.  Past Medical History  Diagnosis Date  . Hypertension   . Hyperlipemia   . Gastric ulcer   . Chronic diarrhea   . Vertigo   . Sliding hiatal hernia   . Schatzki's ring   . Duodenal stricture   . GI bleed   . Microscopic colitis   . Acute blood loss anemia   . Dilation of biliary tract     biliary duct per 02/26/13 CT  . Pancreatic duct dilated 02/26/13  . Esophageal stricture   . Skin cancer   . Nephrolithiasis    Past Surgical History  Procedure Laterality Date  . Nose surgery      for skin cancer  . Esophagogastroduodenoscopy N/A 02/27/2013    Procedure: ESOPHAGOGASTRODUODENOSCOPY (EGD);  Surgeon: Ladene Artist, MD;  Location: [facility] ENDOSCOPY;  Service: Endoscopy;   Laterality: N/A;  . Hot hemostasis N/A 02/27/2013    Procedure: HOT HEMOSTASIS (ARGON PLASMA COAGULATION/BICAP);  Surgeon: Ladene Artist, MD;  Location: [facility] ENDOSCOPY;  Service: Endoscopy;  Laterality: N/A;  . Esophagogastroduodenoscopy N/A 03/28/2013    Procedure: ESOPHAGOGASTRODUODENOSCOPY (EGD);  Surgeon: Rogene Houston, MD;  Location: [facility] ENDO SUITE;  Service: Endoscopy;  Laterality: N/A;   Family History  Problem Relation Age of Onset  . Heart attack Father    Social History  Substance Use Topics  . Smoking status: Never Smoker   . Smokeless tobacco: Never Used  . Alcohol Use: No   OB History    Gravida Para Term Preterm AB TAB SAB Ectopic Multiple Living   6 6 6       6      Review of Systems  Constitutional: Negative for fever and chills.  HENT: Negative for congestion and rhinorrhea.   Eyes: Negative for redness and visual disturbance.  Respiratory: Negative for shortness of breath and wheezing.   Cardiovascular: Negative for chest pain and palpitations.  Gastrointestinal: Negative for nausea and vomiting.  Genitourinary: Negative for dysuria and urgency.  Musculoskeletal: Positive for myalgias. Negative for back pain, arthralgias and neck pain.  Skin: Negative for pallor and wound.  Neurological: Negative for dizziness and headaches.  Allergies  Codeine and Sulfa antibiotics  Home Medications   Prior to Admission medications   Medication Sig Start Date End Date Taking? Authorizing Provider  amLODipine (NORVASC) 5 MG tablet Take 5 mg by mouth daily.   Yes Historical Provider, MD  aspirin EC 81 MG tablet Take 81 mg by mouth daily.   Yes Historical Provider, MD  cholestyramine Lucrezia Starch) 4 GM/DOSE powder Take 4 g by mouth daily as needed (for constipation).  01/12/14  Yes Historical Provider, MD  furosemide (LASIX) 20 MG tablet Take 20 mg by mouth daily. 03/08/14  Yes Historical Provider, MD  loperamide (IMODIUM) 2 MG capsule Take 2 mg by mouth every morning.  02/26/14  Yes Historical Provider, MD  meclizine (ANTIVERT) 25 MG tablet Take 25 mg by mouth 3 (three) times daily as needed for dizziness.  03/15/14  Yes Historical Provider, MD  montelukast (SINGULAIR) 10 MG tablet Take 10 mg by mouth at bedtime.   Yes Historical Provider, MD  pantoprazole (PROTONIX) 40 MG tablet Take 40 mg by mouth every morning.    Yes Historical Provider, MD  potassium chloride (K-DUR,KLOR-CON) 10 MEQ tablet Take 10-20 mEq by mouth 2 (two) times daily. Take 2 tablet in the morning and 1 tablet in the evening   Yes Historical Provider, MD  traMADol (ULTRAM) 50 MG tablet Take 1 tablet (50 mg total) by mouth every 6 (six) hours as needed. 11/16/14   Deno Etienne, DO   BP 160/73 mmHg  Pulse 65  Temp(Src) 98.1 F (36.7 C) (Oral)  Resp 18  Ht 5\' 6"  (1.676 m)  Wt 113 lb (51.256 kg)  BMI 18.25 kg/m2  SpO2 100% Physical Exam  Constitutional: She is oriented to person, place, and time. She appears well-developed and well-nourished. No distress.  HENT:  Head: Normocephalic and atraumatic.  Eyes: EOM are normal. Pupils are equal, round, and reactive to light.  Neck: Normal range of motion. Neck supple.  Cardiovascular: Normal rate and regular rhythm.  Exam reveals no gallop and no friction rub.   No murmur heard. Pulmonary/Chest: Effort normal. She has no wheezes. She has no rales.  Abdominal: Soft. She exhibits no distension. There is no tenderness.  Musculoskeletal: She exhibits no edema or tenderness.  No noted edema no tenderness. Reflexes 2+2 patellar and Achilles. Patient able to ambulate without difficulty. No significant tenderness on palpation. No swelling. Pulses equal and 2+. No knee pain bilaterally.  Neurological: She is alert and oriented to person, place, and time.  Skin: Skin is warm and dry. She is not diaphoretic.  Psychiatric: She has a normal mood and affect. Her behavior is normal.    ED Course  Procedures (including critical care time) Labs Review Labs  Reviewed  CBC WITH DIFFERENTIAL/PLATELET - Abnormal; Notable for the following:    Hemoglobin 11.6 (*)    HCT 34.9 (*)    All other components within normal limits  BASIC METABOLIC PANEL - Abnormal; Notable for the following:    Potassium 3.4 (*)    Creatinine, Ser 1.03 (*)    GFR calc non Af Amer 49 (*)    GFR calc Af Amer 56 (*)    All other components within normal limits  MAGNESIUM    Imaging Review No results found. I have personally reviewed and evaluated these images and lab results as part of my medical decision-making.   EKG Interpretation None      MDM   Final diagnoses:  Leg pain, bilateral    81 yo  F with a chief complaint of bilateral lower extremity pain. No noted cause noted on exam. Patient well-appearing nontoxic. No noted back pain. Pulses equal bilaterally. Nonexertional. Possible neuropathic pain. No history of diabetes. Without injury see no reason for imaging at this time. Mild hypokalemia. Will replenish. PCP follow-up.  9:52 PM:  I have discussed the diagnosis/risks/treatment options with the patient and family and believe the pt to be eligible for discharge home to follow-up with PCP. We also discussed returning to the ED immediately if new or worsening sx occur. We discussed the sx which are most concerning (e.g., sudden worsening pain) that necessitate immediate return. Medications administered to the patient during their visit and any new prescriptions provided to the patient are listed below.   Medications given during this visit Medications  potassium chloride SA (K-DUR,KLOR-CON) CR tablet 40 mEq (40 mEq Oral Given 11/16/14 1758)  traMADol (ULTRAM) tablet 50 mg (50 mg Oral Given 11/16/14 1758)    Discharge Medication List as of 11/16/2014  5:53 PM    START taking these medications   Details  traMADol (ULTRAM) 50 MG tablet Take 1 tablet (50 mg total) by mouth every 6 (six) hours as needed., Starting 11/16/2014, Until Discontinued, Print          The patient appears reasonably screen and/or stabilized for discharge and I doubt any other medical condition or other [facility] requiring further screening, evaluation, or treatment in the ED at this time prior to discharge.      Deno Etienne, DO 11/16/14 2152

## 2015-02-02 ENCOUNTER — Ambulatory Visit (INDEPENDENT_AMBULATORY_CARE_PROVIDER_SITE_OTHER): Payer: Self-pay | Admitting: Internal Medicine

## 2015-02-16 ENCOUNTER — Emergency Department (HOSPITAL_COMMUNITY)
Admission: EM | Admit: 2015-02-16 | Discharge: 2015-02-16 | Disposition: A | Discharge status: Home / Self Care | Payer: Medicare Other | Attending: Emergency Medicine | Admitting: Emergency Medicine

## 2015-02-16 ENCOUNTER — Emergency Department (HOSPITAL_COMMUNITY): Payer: Medicare Other

## 2015-02-16 ENCOUNTER — Encounter (HOSPITAL_COMMUNITY): Payer: Self-pay

## 2015-02-16 DIAGNOSIS — E785 Hyperlipidemia, unspecified: Secondary | ICD-10-CM | POA: Diagnosis not present

## 2015-02-16 DIAGNOSIS — Z8719 Personal history of other diseases of the digestive system: Secondary | ICD-10-CM | POA: Diagnosis not present

## 2015-02-16 DIAGNOSIS — Z7982 Long term (current) use of aspirin: Secondary | ICD-10-CM | POA: Insufficient documentation

## 2015-02-16 DIAGNOSIS — Z862 Personal history of diseases of the blood and blood-forming organs and certain disorders involving the immune mechanism: Secondary | ICD-10-CM | POA: Insufficient documentation

## 2015-02-16 DIAGNOSIS — R51 Headache: Secondary | ICD-10-CM | POA: Insufficient documentation

## 2015-02-16 DIAGNOSIS — Z79899 Other long term (current) drug therapy: Secondary | ICD-10-CM | POA: Insufficient documentation

## 2015-02-16 DIAGNOSIS — R519 Headache, unspecified: Secondary | ICD-10-CM

## 2015-02-16 DIAGNOSIS — Z87442 Personal history of urinary calculi: Secondary | ICD-10-CM | POA: Insufficient documentation

## 2015-02-16 DIAGNOSIS — I10 Essential (primary) hypertension: Secondary | ICD-10-CM | POA: Insufficient documentation

## 2015-02-16 DIAGNOSIS — Z85828 Personal history of other malignant neoplasm of skin: Secondary | ICD-10-CM | POA: Diagnosis not present

## 2015-02-16 DIAGNOSIS — R42 Dizziness and giddiness: Secondary | ICD-10-CM | POA: Diagnosis not present

## 2015-02-16 DIAGNOSIS — N390 Urinary tract infection, site not specified: Secondary | ICD-10-CM | POA: Diagnosis not present

## 2015-02-16 LAB — CBC WITH DIFFERENTIAL/PLATELET
Basophils Absolute: 0 10*3/uL (ref 0.0–0.1)
Basophils Relative: 0 %
Eosinophils Absolute: 0.1 10*3/uL (ref 0.0–0.7)
Eosinophils Relative: 1 %
HCT: 31.8 % — ABNORMAL LOW (ref 36.0–46.0)
Hemoglobin: 10.3 g/dL — ABNORMAL LOW (ref 12.0–15.0)
Lymphocytes Relative: 23 %
Lymphs Abs: 1.5 10*3/uL (ref 0.7–4.0)
MCH: 28.8 pg (ref 26.0–34.0)
MCHC: 32.4 g/dL (ref 30.0–36.0)
MCV: 88.8 fL (ref 78.0–100.0)
Monocytes Absolute: 0.5 10*3/uL (ref 0.1–1.0)
Monocytes Relative: 8 %
Neutro Abs: 4.4 10*3/uL (ref 1.7–7.7)
Neutrophils Relative %: 68 %
Platelets: 268 10*3/uL (ref 150–400)
RBC: 3.58 MIL/uL — ABNORMAL LOW (ref 3.87–5.11)
RDW: 13.8 % (ref 11.5–15.5)
WBC: 6.5 10*3/uL (ref 4.0–10.5)

## 2015-02-16 LAB — BASIC METABOLIC PANEL
Anion gap: 6 (ref 5–15)
BUN: 15 mg/dL (ref 6–20)
CO2: 26 mmol/L (ref 22–32)
Calcium: 8.7 mg/dL — ABNORMAL LOW (ref 8.9–10.3)
Chloride: 108 mmol/L (ref 101–111)
Creatinine, Ser: 0.88 mg/dL (ref 0.44–1.00)
GFR calc Af Amer: >60 mL/min (ref >60)
GFR calc non Af Amer: 59 mL/min — ABNORMAL LOW (ref >60)
Glucose, Bld: 113 mg/dL — ABNORMAL HIGH (ref 65–99)
Potassium: 3.4 mmol/L — ABNORMAL LOW (ref 3.5–5.1)
Sodium: 140 mmol/L (ref 135–145)

## 2015-02-16 LAB — URINALYSIS, ROUTINE W REFLEX MICROSCOPIC
Bilirubin Urine: NEGATIVE
Glucose, UA: NEGATIVE mg/dL
Ketones, ur: NEGATIVE mg/dL
Nitrite: NEGATIVE
Protein, ur: NEGATIVE mg/dL
Specific Gravity, Urine: 1.01 (ref 1.005–1.030)
pH: 5 (ref 5.0–8.0)

## 2015-02-16 LAB — URINE MICROSCOPIC-ADD ON

## 2015-02-16 IMAGING — CT CT HEAD W/O CM
1 series · 16 of 30 positions shown, 20 images · non-contrast
Comparison: None.

CLINICAL DATA: Weakness and dizziness

EXAM:
CT HEAD WITHOUT CONTRAST
TECHNIQUE: Contiguous axial images were obtained from the base of the skull
through the vertex without intravenous contrast.

[Series 2: headseq 4.8 h37s · axial · 0.43mm/px · z∈[+111,+263]mm · 16 of 36 slices shown, 20 images]
[im 2/36  brain]
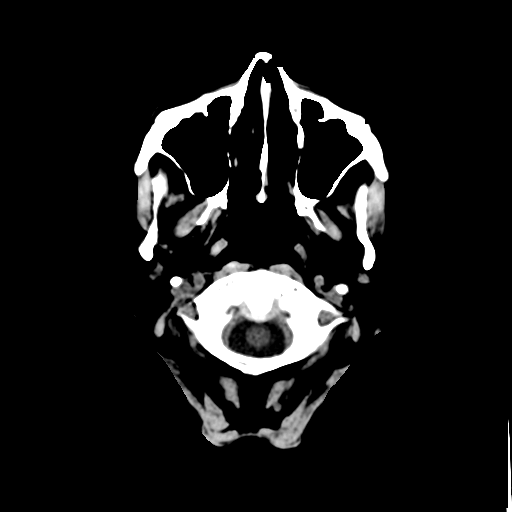
[im 2/36  bone]
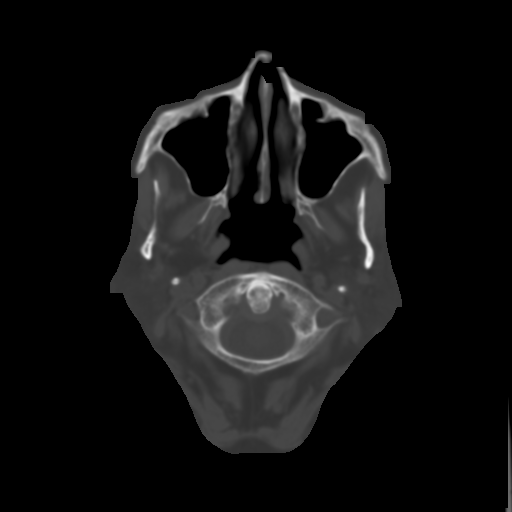
[im 4/36  brain]
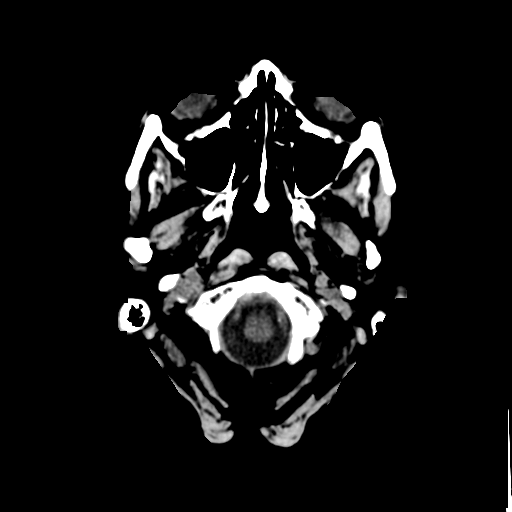
[im 7/36  brain]
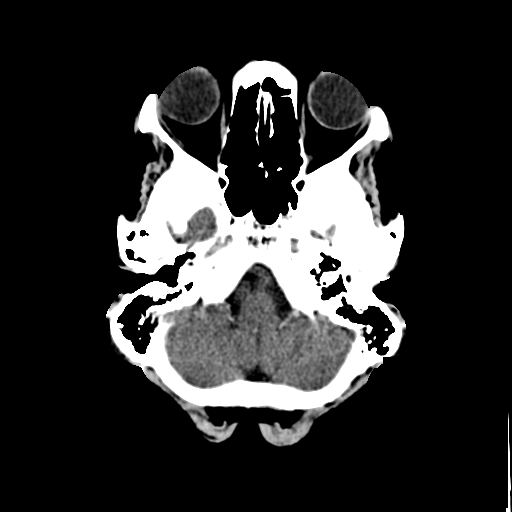
[im 9/36  brain]
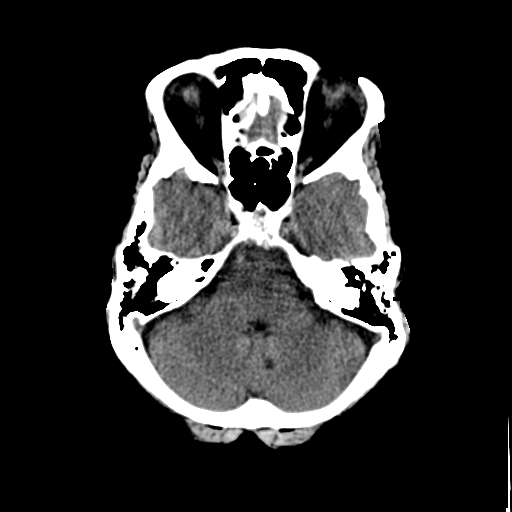
[im 10/36  brain]
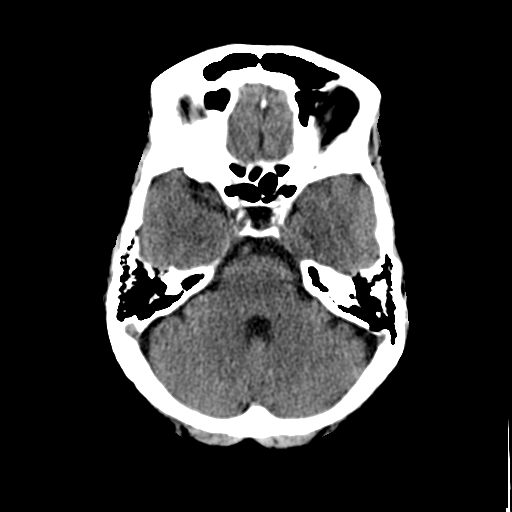
[im 10/36  bone]
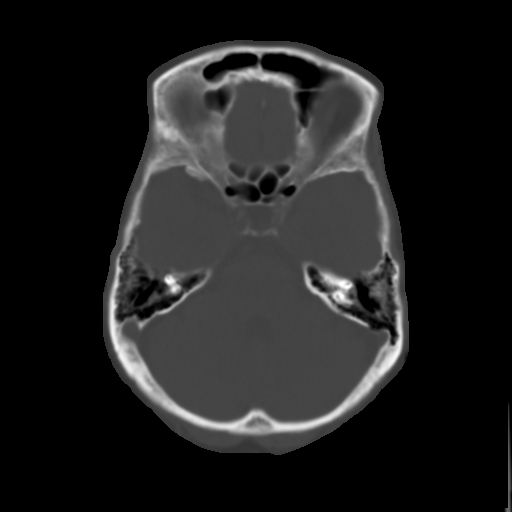
[im 13/36  brain]
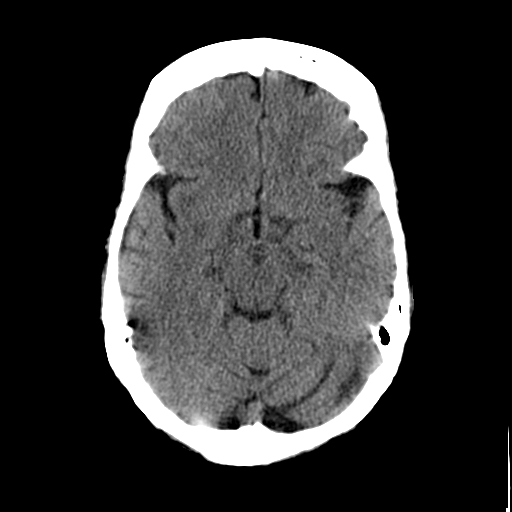
[im 15/36  brain]
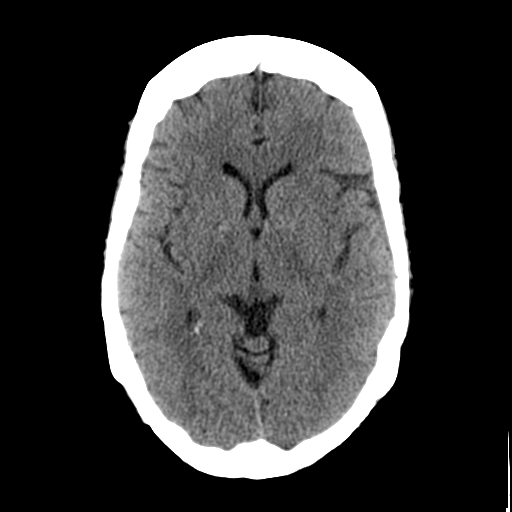
[im 17/36  brain]
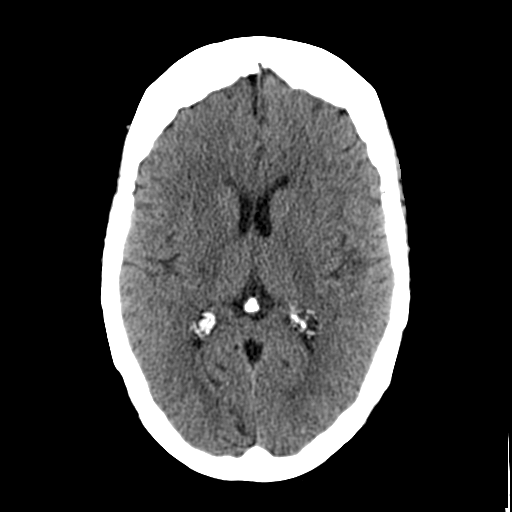
[im 19/36  brain]
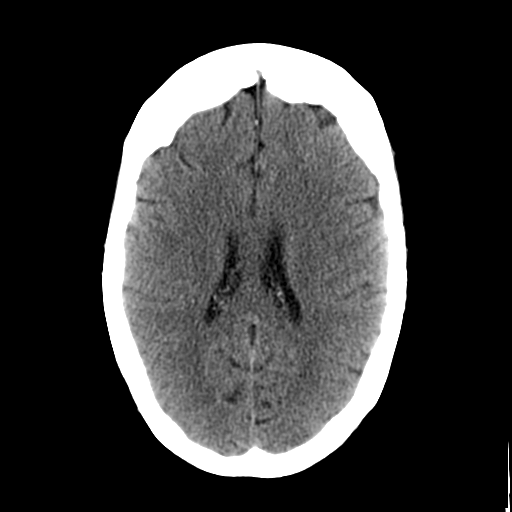
[im 19/36  bone]
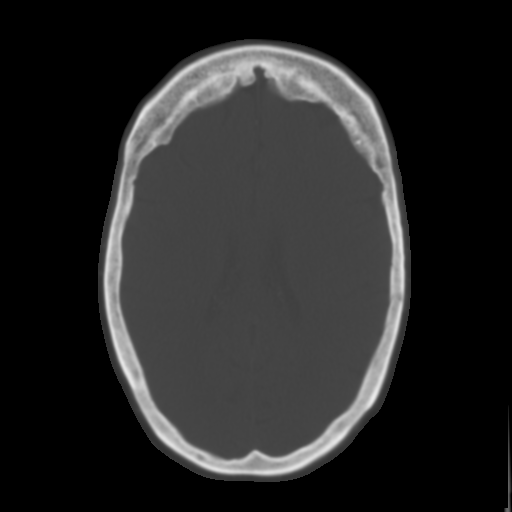
[im 21/36  brain]
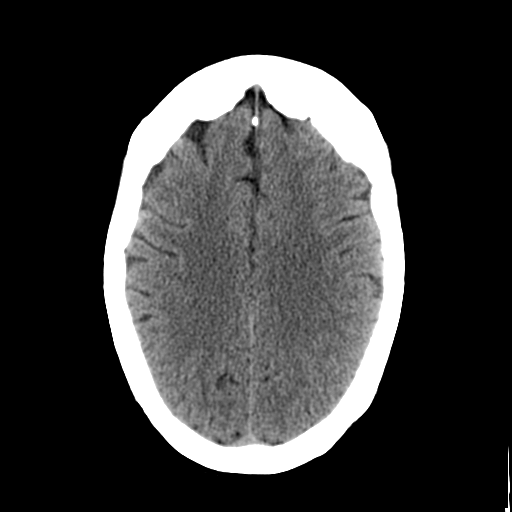
[im 23/36  brain]
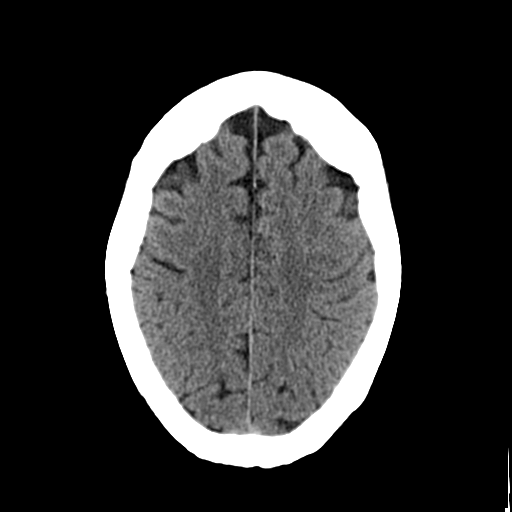
[im 26/36  brain]
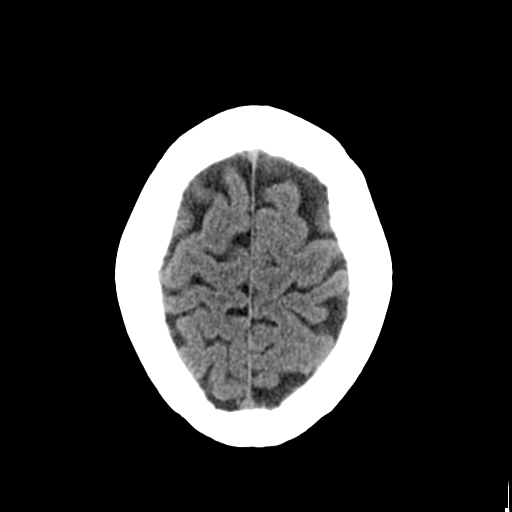
[im 27/36  brain]
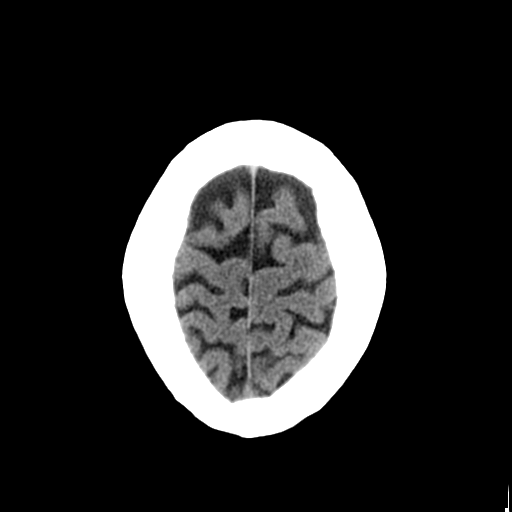
[im 27/36  bone]
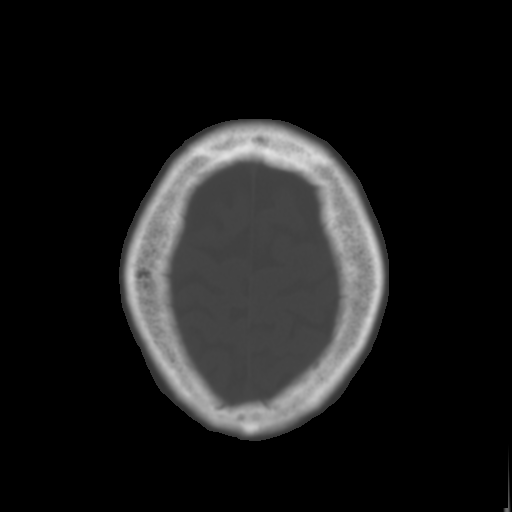
[im 29/36  brain]
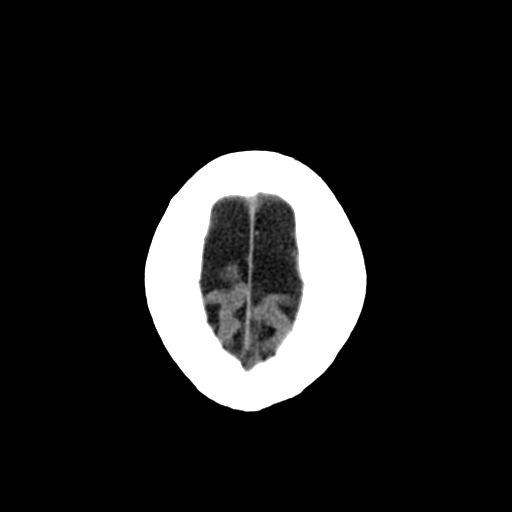
[im 32/36  brain]
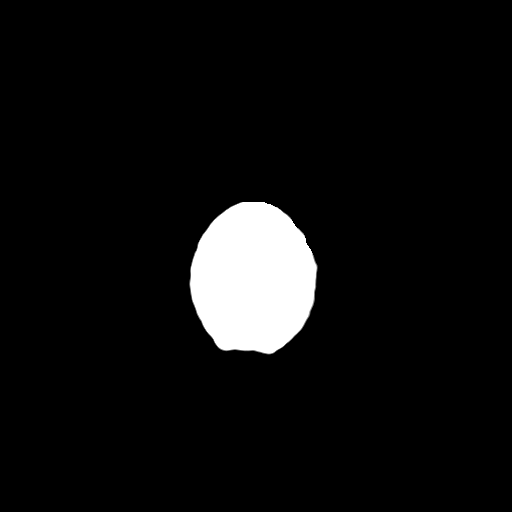
[im 34/36  brain]
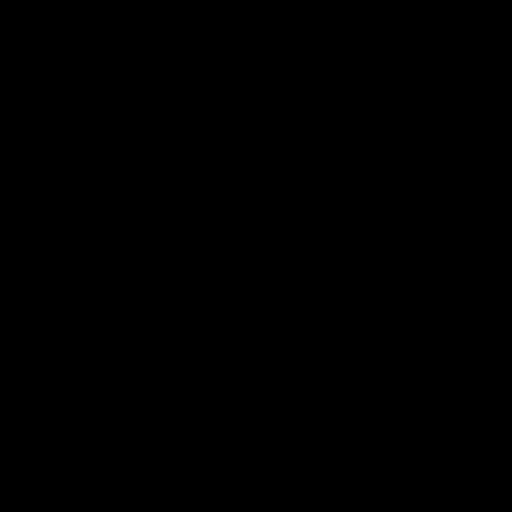

[16 of 30 positions shown; findings below may reference images not displayed]

FINDINGS: The bony calvarium is intact. No gross soft tissue abnormality is
noted. No findings to suggest acute hemorrhage, acute infarction or
space-occupying mass lesion are noted.
IMPRESSION: No acute abnormality noted.

## 2015-02-16 MED ORDER — HYDROCODONE-ACETAMINOPHEN 5-325 MG PO TABS
1.0000 | ORAL_TABLET | Freq: Once | ORAL | Status: AC
  Administered 2015-02-16: 1 via ORAL
  Filled 2015-02-16: qty 1

## 2015-02-16 MED ORDER — CEPHALEXIN 500 MG PO CAPS: 500.0000 mg | ORAL_CAPSULE | Freq: Four times a day (QID) | ORAL | Status: DC

## 2015-02-16 NOTE — ED Notes (Signed)
Pt c/o generalized weakness, dizziness, headache, hypertension x several days. Pt reports she takes BP medication and has been taking it as prescribed. Pt denies blurry vision. Pt A&O x 4. Daughter reports pt has had this problem before with her blood pressure going up, but today is the highest she has known it to be.

## 2015-02-16 NOTE — ED Notes (Signed)
Pt reports dizziness and generalized weakness for past few days.  Reports headache and htn today.

## 2015-02-16 NOTE — ED Notes (Signed)
Discharge instructions given, pt demonstrated teach back and verbal understanding. No concerns voiced.  

## 2015-02-16 NOTE — ED Provider Notes (Signed)
CSN: [id number]     Arrival date & time 02/16/15  1616 History   First MD Initiated Contact with Patient 02/16/15 1638     Chief Complaint  Patient presents with  . Dizziness  . generalized weakness      (Consider location/radiation/quality/duration/timing/severity/associated sxs/prior Treatment) Patient is a 81 y.o. female presenting with dizziness. The history is provided by the patient (Patient complains of a headache and some dizziness.).  Dizziness Quality:  Lightheadedness Severity:  Mild Onset quality:  Gradual Timing:  Intermittent Progression:  Waxing and waning Chronicity:  New Context: not with head movement   Relieved by:  Nothing Associated symptoms: no chest pain, no diarrhea and no headaches     Past Medical History  Diagnosis Date  . Hypertension   . Hyperlipemia   . Gastric ulcer   . Chronic diarrhea   . Vertigo   . Sliding hiatal hernia   . Schatzki's ring   . Duodenal stricture   . GI bleed   . Microscopic colitis   . Acute blood loss anemia   . Dilation of biliary tract     biliary duct per 02/26/13 CT  . Pancreatic duct dilated ([place]) 02/26/13  . Esophageal stricture   . Skin cancer   . Nephrolithiasis    Past Surgical History  Procedure Laterality Date  . Nose surgery      for skin cancer  . Esophagogastroduodenoscopy N/A 02/27/2013    Procedure: ESOPHAGOGASTRODUODENOSCOPY (EGD);  Surgeon: Ladene Artist, MD;  Location: [facility] ENDOSCOPY;  Service: Endoscopy;  Laterality: N/A;  . Hot hemostasis N/A 02/27/2013    Procedure: HOT HEMOSTASIS (ARGON PLASMA COAGULATION/BICAP);  Surgeon: Ladene Artist, MD;  Location: [facility] ENDOSCOPY;  Service: Endoscopy;  Laterality: N/A;  . Esophagogastroduodenoscopy N/A 03/28/2013    Procedure: ESOPHAGOGASTRODUODENOSCOPY (EGD);  Surgeon: Rogene Houston, MD;  Location: [facility] ENDO SUITE;  Service: Endoscopy;  Laterality: N/A;   Family History  Problem Relation Age of Onset  . Heart attack Father    Social History   Substance Use Topics  . Smoking status: Never Smoker   . Smokeless tobacco: Never Used  . Alcohol Use: No   OB History    Gravida Para Term Preterm AB TAB SAB Ectopic Multiple Living   6 6 6       6      Review of Systems  Constitutional: Negative for appetite change and fatigue.  HENT: Negative for congestion, ear discharge and sinus pressure.   Eyes: Negative for discharge.  Respiratory: Negative for cough.   Cardiovascular: Negative for chest pain.  Gastrointestinal: Negative for abdominal pain and diarrhea.  Genitourinary: Negative for frequency and hematuria.  Musculoskeletal: Negative for back pain.  Skin: Negative for rash.  Neurological: Positive for dizziness. Negative for seizures and headaches.  Psychiatric/Behavioral: Negative for hallucinations.      Allergies  Codeine and Sulfa antibiotics  Home Medications   Prior to Admission medications   Medication Sig Start Date End Date Taking? Authorizing Provider  losartan (COZAAR) 50 MG tablet Take 50 mg by mouth daily.   Yes Historical Provider, MD  aspirin EC 81 MG tablet Take 81 mg by mouth daily.    Historical Provider, MD  cholestyramine Lucrezia Starch) 4 GM/DOSE powder Take 4 g by mouth daily as needed (for constipation).  01/12/14   Historical Provider, MD  furosemide (LASIX) 20 MG tablet Take 20 mg by mouth daily. 03/08/14   Historical Provider, MD  loperamide (IMODIUM) 2 MG capsule Take 2 mg by  mouth every morning. 02/26/14   Historical Provider, MD  meclizine (ANTIVERT) 25 MG tablet Take 25 mg by mouth 3 (three) times daily as needed for dizziness.  03/15/14   Historical Provider, MD  montelukast (SINGULAIR) 10 MG tablet Take 10 mg by mouth at bedtime.    Historical Provider, MD  pantoprazole (PROTONIX) 40 MG tablet Take 40 mg by mouth every morning.     Historical Provider, MD  potassium chloride (K-DUR,KLOR-CON) 10 MEQ tablet Take 10-20 mEq by mouth 2 (two) times daily. Take 2 tablet in the morning and 1 tablet  in the evening    Historical Provider, MD  traMADol (ULTRAM) 50 MG tablet Take 1 tablet (50 mg total) by mouth every 6 (six) hours as needed. 11/16/14   Deno Etienne, DO   BP 156/78 mmHg  Pulse 70  Temp(Src) 97.7 F (36.5 C) (Oral)  Resp 18  Ht 5\' 3"  (1.6 m)  Wt 113 lb (51.256 kg)  BMI 20.02 kg/m2  SpO2 97% Physical Exam  Constitutional: She is oriented to person, place, and time. She appears well-developed.  HENT:  Head: Normocephalic.  Eyes: Conjunctivae and EOM are normal. No scleral icterus.  Neck: Neck supple. No thyromegaly present.  Cardiovascular: Normal rate and regular rhythm.  Exam reveals no gallop and no friction rub.   No murmur heard. Pulmonary/Chest: No stridor. She has no wheezes. She has no rales. She exhibits no tenderness.  Abdominal: She exhibits no distension. There is no tenderness. There is no rebound.  Musculoskeletal: Normal range of motion. She exhibits no edema.  Lymphadenopathy:    She has no cervical adenopathy.  Neurological: She is oriented to person, place, and time. She exhibits normal muscle tone. Coordination normal.  Skin: No rash noted. No erythema.  Psychiatric: She has a normal mood and affect. Her behavior is normal.    ED Course  Procedures (including critical care time) Labs Review Labs Reviewed  CBC WITH DIFFERENTIAL/PLATELET - Abnormal; Notable for the following:    RBC 3.58 (*)    Hemoglobin 10.3 (*)    HCT 31.8 (*)    All other components within normal limits  BASIC METABOLIC PANEL - Abnormal; Notable for the following:    Potassium 3.4 (*)    Glucose, Bld 113 (*)    Calcium 8.7 (*)    GFR calc non Af Amer 59 (*)    All other components within normal limits  URINALYSIS, ROUTINE W REFLEX MICROSCOPIC (NOT AT [facility]) - Abnormal; Notable for the following:    APPearance HAZY (*)    Hgb urine dipstick TRACE (*)    Leukocytes, UA LARGE (*)    All other components within normal limits  URINE MICROSCOPIC-ADD ON - Abnormal; Notable for  the following:    Squamous Epithelial / LPF 6-30 (*)    Bacteria, UA FEW (*)    All other components within normal limits    Imaging Review Ct Head Wo Contrast  02/16/2015  CLINICAL DATA:  Weakness and dizziness EXAM: CT HEAD WITHOUT CONTRAST TECHNIQUE: Contiguous axial images were obtained from the base of the skull through the vertex without intravenous contrast. COMPARISON:  None. FINDINGS: The bony calvarium is intact. No gross soft tissue abnormality is noted. No findings to suggest acute hemorrhage, acute infarction or space-occupying mass lesion are noted. IMPRESSION: No acute abnormality noted. Electronically Signed   By: Inez Catalina M.D.   On: 02/16/2015 17:48   I have personally reviewed and evaluated these images and lab results  as part of my medical decision-making.   EKG Interpretation   Date/Time:  Thursday February 16 2015 16:29:34 EST Ventricular Rate:  71 PR Interval:  148 QRS Duration: 92 QT Interval:  415 QTC Calculation: 451 R Axis:   -27 Text Interpretation:  Sinus rhythm Abnormal R-wave progression, early  transition LVH with secondary repolarization abnormality Baseline wander  in lead(s) I II aVR aVL Confirmed by Jeremey Bascom  MD, Juniper Snyders [phone]) on  02/16/2015 8:26:23 PM      MDM   Final diagnoses:  Headache behind the eyes  UTI (lower urinary tract infection)    Labs unremarkable. Except for large amount of leukocytes in the urine. Will culture urine put on some antibiotics however follow-up with her PCP    Milton Ferguson, MD 02/16/15 2040

## 2015-02-16 NOTE — Discharge Instructions (Signed)
Follow up with your md next week for recheck °

## 2015-02-16 NOTE — ED Notes (Addendum)
Pt's daughter reports that pt doesn't eat or drink very much at home. Pt lives with her daughter but refuses to eat or drink except for very small amounts per daughter.

## 2015-02-25 ENCOUNTER — Emergency Department (HOSPITAL_COMMUNITY)
Admission: EM | Admit: 2015-02-25 | Discharge: 2015-02-25 | Disposition: A | Discharge status: Home / Self Care | Payer: Medicare Other | Attending: Emergency Medicine | Admitting: Emergency Medicine

## 2015-02-25 ENCOUNTER — Encounter (HOSPITAL_COMMUNITY): Payer: Self-pay

## 2015-02-25 ENCOUNTER — Emergency Department (HOSPITAL_COMMUNITY): Payer: Medicare Other

## 2015-02-25 DIAGNOSIS — Z79899 Other long term (current) drug therapy: Secondary | ICD-10-CM | POA: Diagnosis not present

## 2015-02-25 DIAGNOSIS — Z8719 Personal history of other diseases of the digestive system: Secondary | ICD-10-CM | POA: Insufficient documentation

## 2015-02-25 DIAGNOSIS — Z85828 Personal history of other malignant neoplasm of skin: Secondary | ICD-10-CM | POA: Insufficient documentation

## 2015-02-25 DIAGNOSIS — D649 Anemia, unspecified: Secondary | ICD-10-CM | POA: Insufficient documentation

## 2015-02-25 DIAGNOSIS — Z7982 Long term (current) use of aspirin: Secondary | ICD-10-CM | POA: Insufficient documentation

## 2015-02-25 DIAGNOSIS — Z87442 Personal history of urinary calculi: Secondary | ICD-10-CM | POA: Insufficient documentation

## 2015-02-25 DIAGNOSIS — E876 Hypokalemia: Secondary | ICD-10-CM | POA: Diagnosis not present

## 2015-02-25 DIAGNOSIS — Z792 Long term (current) use of antibiotics: Secondary | ICD-10-CM | POA: Diagnosis not present

## 2015-02-25 DIAGNOSIS — I10 Essential (primary) hypertension: Secondary | ICD-10-CM | POA: Insufficient documentation

## 2015-02-25 DIAGNOSIS — R51 Headache: Secondary | ICD-10-CM | POA: Diagnosis present

## 2015-02-25 LAB — CBC WITH DIFFERENTIAL/PLATELET
Basophils Absolute: 0 10*3/uL (ref 0.0–0.1)
Basophils Relative: 0 %
Eosinophils Absolute: 0.1 10*3/uL (ref 0.0–0.7)
Eosinophils Relative: 1 %
HCT: 31.6 % — ABNORMAL LOW (ref 36.0–46.0)
Hemoglobin: 10.5 g/dL — ABNORMAL LOW (ref 12.0–15.0)
Lymphocytes Relative: 27 %
Lymphs Abs: 1.8 10*3/uL (ref 0.7–4.0)
MCH: 29.2 pg (ref 26.0–34.0)
MCHC: 33.2 g/dL (ref 30.0–36.0)
MCV: 88 fL (ref 78.0–100.0)
Monocytes Absolute: 0.6 10*3/uL (ref 0.1–1.0)
Monocytes Relative: 9 %
Neutro Abs: 4.1 10*3/uL (ref 1.7–7.7)
Neutrophils Relative %: 63 %
Platelets: 263 10*3/uL (ref 150–400)
RBC: 3.59 MIL/uL — ABNORMAL LOW (ref 3.87–5.11)
RDW: 13.9 % (ref 11.5–15.5)
WBC: 6.6 10*3/uL (ref 4.0–10.5)

## 2015-02-25 LAB — BASIC METABOLIC PANEL
Anion gap: 7 (ref 5–15)
BUN: 13 mg/dL (ref 6–20)
CO2: 28 mmol/L (ref 22–32)
Calcium: 8.4 mg/dL — ABNORMAL LOW (ref 8.9–10.3)
Chloride: 105 mmol/L (ref 101–111)
Creatinine, Ser: 0.7 mg/dL (ref 0.44–1.00)
GFR calc Af Amer: >60 mL/min (ref >60)
GFR calc non Af Amer: >60 mL/min (ref >60)
Glucose, Bld: 111 mg/dL — ABNORMAL HIGH (ref 65–99)
Potassium: 2.8 mmol/L — ABNORMAL LOW (ref 3.5–5.1)
Sodium: 140 mmol/L (ref 135–145)

## 2015-02-25 IMAGING — CT CT HEAD W/O CM
1 of 2 series · 16 of 30 positions shown, 20 images · non-contrast
Comparison: [DATE]

CLINICAL DATA: Headache. High blood pressure for 2 days. Patient
was seen here about 1 week ago for hypertension but never followed
up with outpatient physician.

EXAM:
CT HEAD WITHOUT CONTRAST
TECHNIQUE: Contiguous axial images were obtained from the base of the skull
through the vertex without intravenous contrast.

[Series 3: headtrauma 2.4 h60s · axial · 0.43mm/px · z∈[+41,+196]mm · 16 of 72 slices shown, 20 images]
[im 4/72  brain]
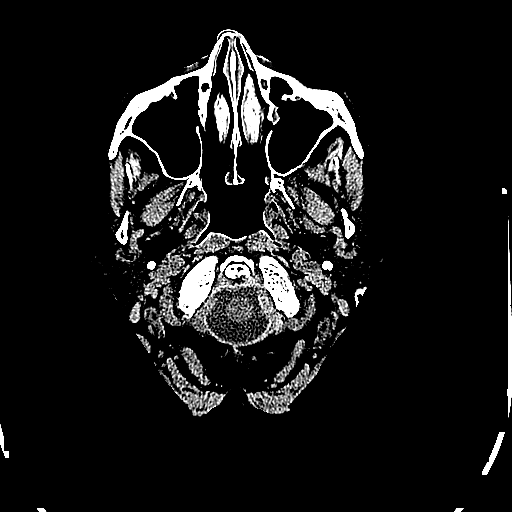
[im 4/72  bone]
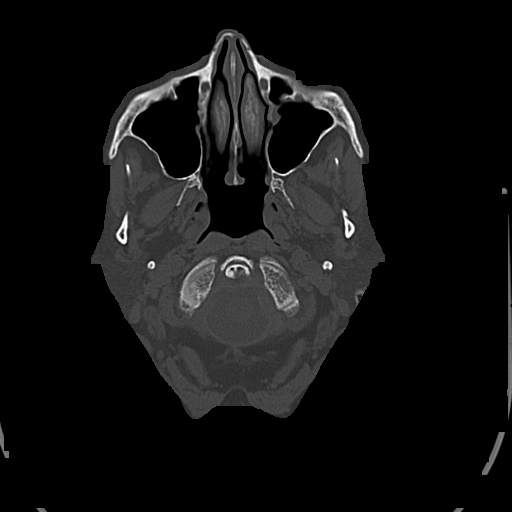
[im 8/72  brain]
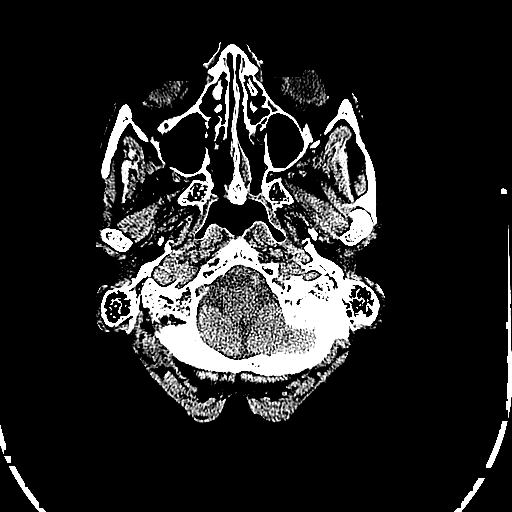
[im 12/72  brain]
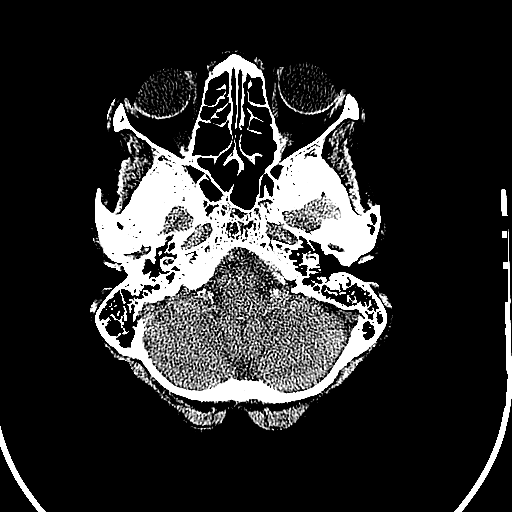
[im 15/72  brain]
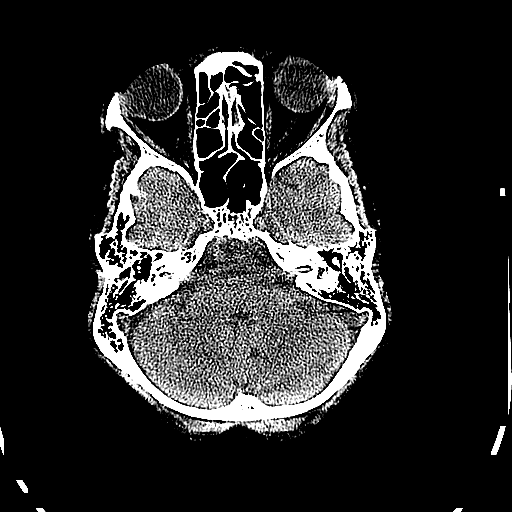
[im 23/72  brain]
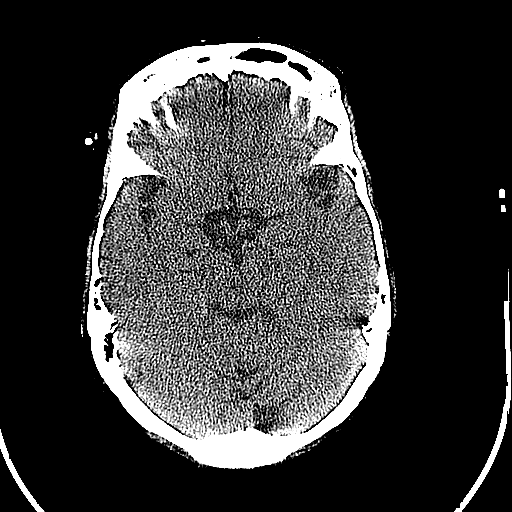
[im 23/72  bone]
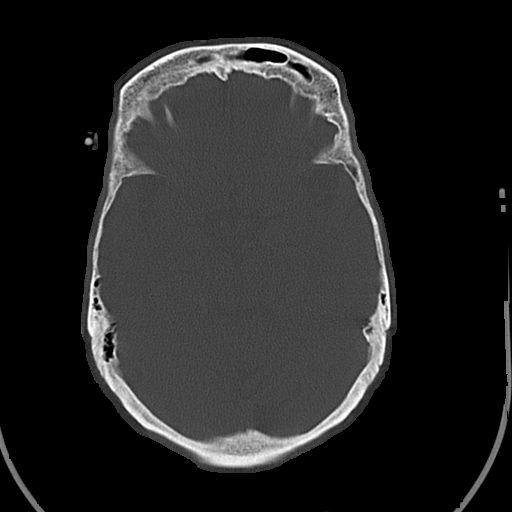
[im 27/72  brain]
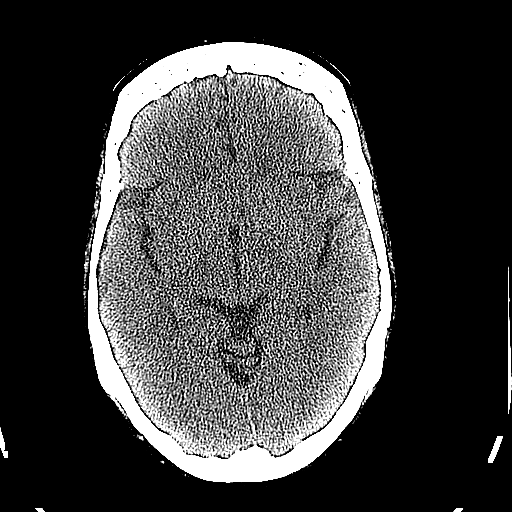
[im 30/72  brain]
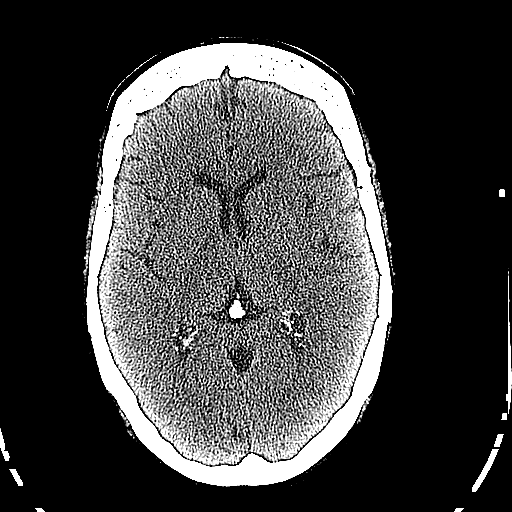
[im 34/72  brain]
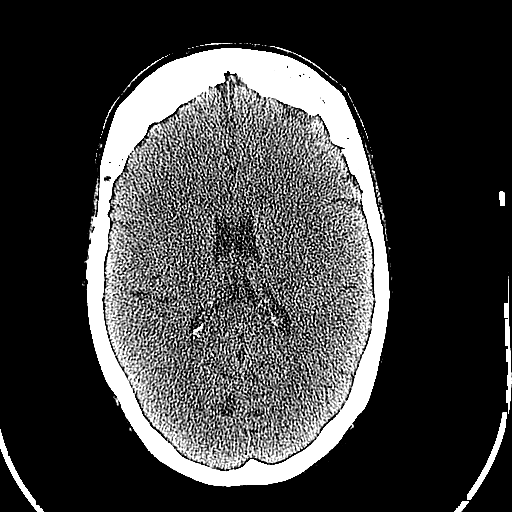
[im 38/72  brain]
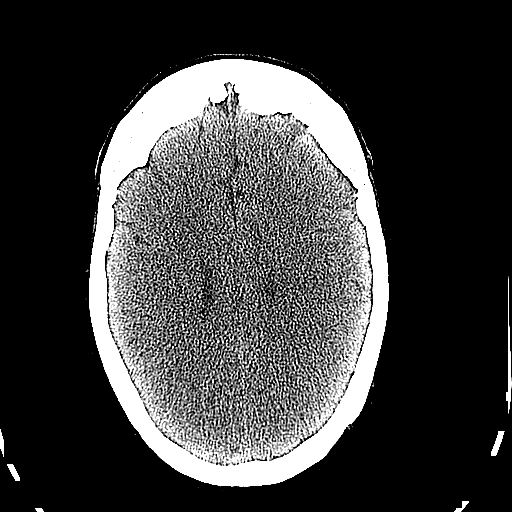
[im 38/72  bone]
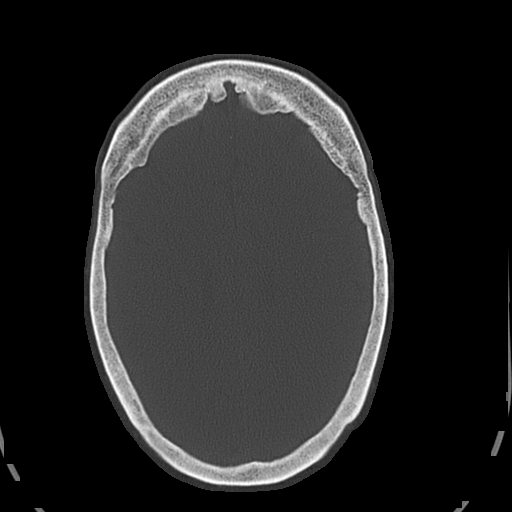
[im 42/72  brain]
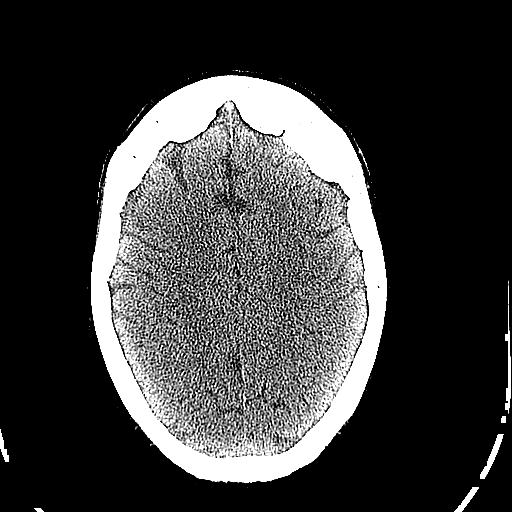
[im 45/72  brain]
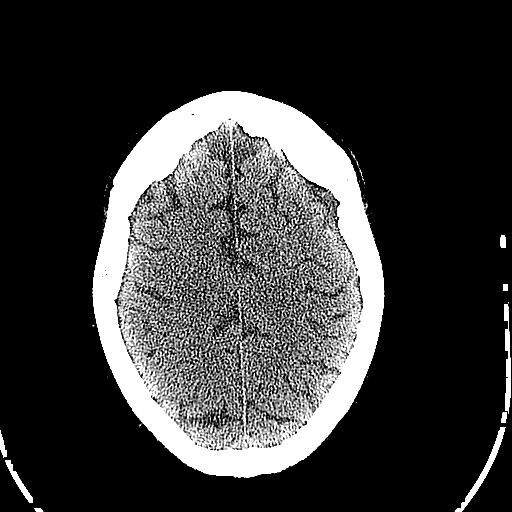
[im 49/72  brain]
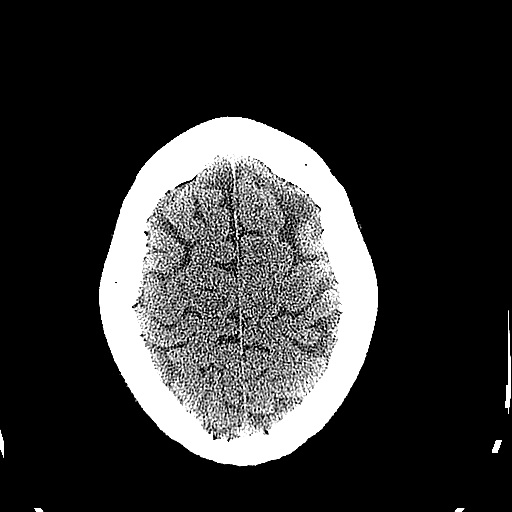
[im 57/72  brain]
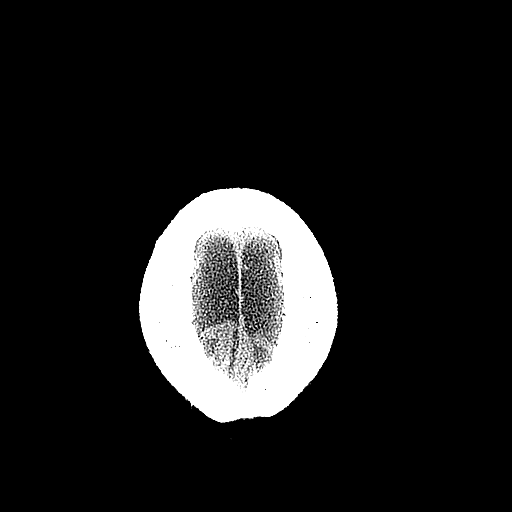
[im 57/72  bone]
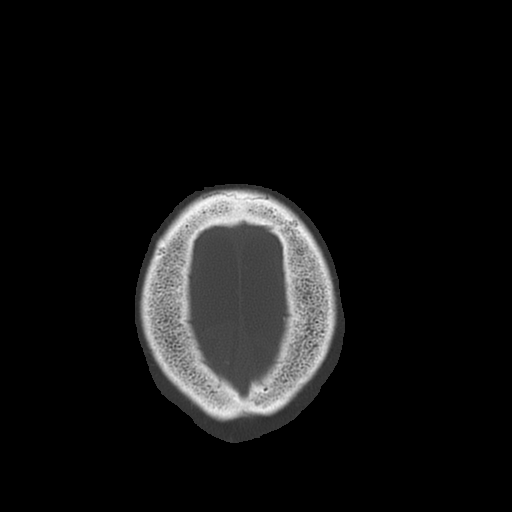
[im 60/72  brain]
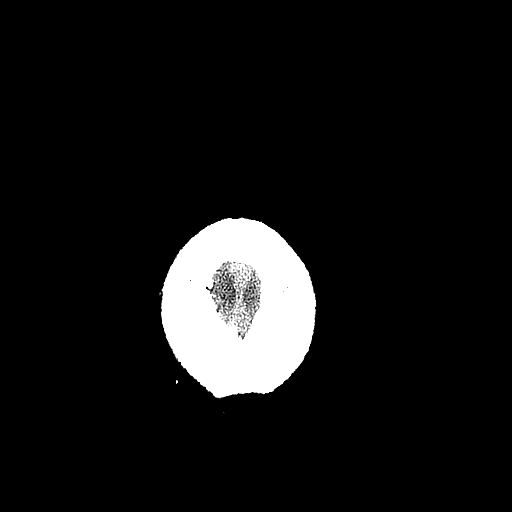
[im 64/72  brain]
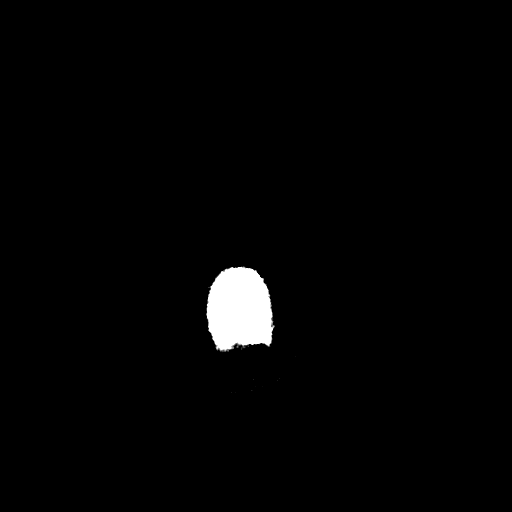
[im 68/72  brain]
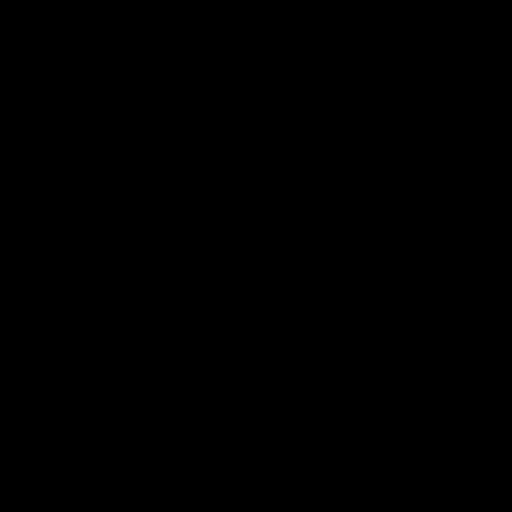

[16 of 30 positions shown; findings below may reference images not displayed]

FINDINGS: Ventricles and sulci appear symmetrical. No ventricular dilatation.
No mass effect or midline shift. No abnormal extra-axial fluid
collections. Gray-white matter junctions are distinct. Basal
cisterns are not effaced. No evidence of acute intracranial
hemorrhage. No depressed skull fractures. Visualized paranasal
sinuses and mastoid air cells are not opacified.
IMPRESSION: No acute intracranial abnormalities.

## 2015-02-25 MED ORDER — POTASSIUM CHLORIDE CRYS ER 10 MEQ PO TBCR: 20.0000 meq | EXTENDED_RELEASE_TABLET | Freq: Two times a day (BID) | ORAL | Status: DC

## 2015-02-25 MED ORDER — AMLODIPINE BESYLATE 5 MG PO TABS
5.0000 mg | ORAL_TABLET | Freq: Once | ORAL | Status: AC
  Administered 2015-02-25: 5 mg via ORAL
  Filled 2015-02-25: qty 1

## 2015-02-25 MED ORDER — AMLODIPINE BESYLATE 5 MG PO TABS: 5.0000 mg | ORAL_TABLET | Freq: Every day | ORAL | Status: DC

## 2015-02-25 MED ORDER — POTASSIUM CHLORIDE CRYS ER 20 MEQ PO TBCR
40.0000 meq | EXTENDED_RELEASE_TABLET | Freq: Once | ORAL | Status: AC
  Administered 2015-02-25: 40 meq via ORAL
  Filled 2015-02-25: qty 2

## 2015-02-25 NOTE — ED Provider Notes (Signed)
CSN: [id number]     Arrival date & time 02/25/15  1945 History   First MD Initiated Contact with Patient 02/25/15 2027     Chief Complaint  Patient presents with  . Hypertension   HPI Patient presents to the emergency room with complaints of high blood pressure. Patient states she was seen in the emergency room about one week ago for similar symptoms. She followed up with her primary doctor. She was given additional medications for her anxiety but no new medications for her blood pressure. Patient states today she started to feel nauseated. She also developed a headache. She took her blood pressure was elevated. At home it was 215/83. Patient was brought into the emergency room for further evaluation. She denies any trouble with any chest pain or shortness of breath. She denies trouble focal numbness or weakness. No trouble with her speech or vision. Past Medical History  Diagnosis Date  . Hypertension   . Hyperlipemia   . Gastric ulcer   . Chronic diarrhea   . Vertigo   . Sliding hiatal hernia   . Schatzki's ring   . Duodenal stricture   . GI bleed   . Microscopic colitis   . Acute blood loss anemia   . Dilation of biliary tract     biliary duct per 02/26/13 CT  . Pancreatic duct dilated ([place]) 02/26/13  . Esophageal stricture   . Skin cancer   . Nephrolithiasis    Past Surgical History  Procedure Laterality Date  . Nose surgery      for skin cancer  . Esophagogastroduodenoscopy N/A 02/27/2013    Procedure: ESOPHAGOGASTRODUODENOSCOPY (EGD);  Surgeon: Ladene Artist, MD;  Location: [facility] ENDOSCOPY;  Service: Endoscopy;  Laterality: N/A;  . Hot hemostasis N/A 02/27/2013    Procedure: HOT HEMOSTASIS (ARGON PLASMA COAGULATION/BICAP);  Surgeon: Ladene Artist, MD;  Location: [facility] ENDOSCOPY;  Service: Endoscopy;  Laterality: N/A;  . Esophagogastroduodenoscopy N/A 03/28/2013    Procedure: ESOPHAGOGASTRODUODENOSCOPY (EGD);  Surgeon: Rogene Houston, MD;  Location: [facility] ENDO SUITE;  Service:  Endoscopy;  Laterality: N/A;   Family History  Problem Relation Age of Onset  . Heart attack Father    Social History  Substance Use Topics  . Smoking status: Never Smoker   . Smokeless tobacco: Never Used  . Alcohol Use: No   OB History    Gravida Para Term Preterm AB TAB SAB Ectopic Multiple Living   6 6 6       6      Review of Systems  All other systems reviewed and are negative.     Allergies  Codeine and Sulfa antibiotics  Home Medications   Prior to Admission medications   Medication Sig Start Date End Date Taking? Authorizing Provider  ALPRAZolam (XANAX) 0.25 MG tablet Take 0.25 mg by mouth 2 (two) times daily as needed. for anxiety 02/17/15  Yes Historical Provider, MD  aspirin EC 81 MG tablet Take 81 mg by mouth daily.   Yes Historical Provider, MD  cephALEXin (KEFLEX) 500 MG capsule Take 1 capsule (500 mg total) by mouth 4 (four) times daily. 02/16/15  Yes Milton Ferguson, MD  cholestyramine Lucrezia Starch) 4 GM/DOSE powder Take 4 g by mouth daily as needed (for constipation).  01/12/14  Yes Historical Provider, MD  citalopram (CELEXA) 10 MG tablet Take 10 mg by mouth daily. 02/17/15  Yes Historical Provider, MD  furosemide (LASIX) 20 MG tablet Take 20 mg by mouth daily. 03/08/14  Yes Historical Provider, MD  loperamide (IMODIUM) 2 MG capsule Take 2 mg by mouth as needed for diarrhea or loose stools.  02/26/14  Yes Historical Provider, MD  losartan (COZAAR) 50 MG tablet Take 50 mg by mouth daily.   Yes Historical Provider, MD  meclizine (ANTIVERT) 25 MG tablet Take 25 mg by mouth 3 (three) times daily as needed for dizziness.  03/15/14  Yes Historical Provider, MD  pantoprazole (PROTONIX) 40 MG tablet Take 40 mg by mouth every morning.    Yes Historical Provider, MD  amLODipine (NORVASC) 5 MG tablet Take 1 tablet (5 mg total) by mouth daily. 02/25/15   [name], MD  potassium chloride (K-DUR,KLOR-CON) 10 MEQ tablet Take 2 tablets (20 mEq total) by mouth 2 (two) times  daily. 02/25/15   [name], MD  traMADol (ULTRAM) 50 MG tablet Take 1 tablet (50 mg total) by mouth every 6 (six) hours as needed. Patient not taking: Reported on 02/25/2015 11/16/14   Deno Etienne, DO   BP 186/86 mmHg  Pulse 67  Temp(Src) 97.8 F (36.6 C) (Oral)  Resp 15  Ht 5\' 6"  (1.676 m)  Wt 51.71 kg  BMI 18.41 kg/m2  SpO2 97% Physical Exam  Constitutional: She is oriented to person, place, and time. She appears well-developed and well-nourished. No distress.  HENT:  Head: Normocephalic and atraumatic.  Right Ear: External ear normal.  Left Ear: External ear normal.  Mouth/Throat: Oropharynx is clear and moist.  Eyes: Conjunctivae are normal. Right eye exhibits no discharge. Left eye exhibits no discharge. No scleral icterus.  Neck: Neck supple. No tracheal deviation present.  Cardiovascular: Normal rate, regular rhythm and intact distal pulses.   Pulmonary/Chest: Effort normal and breath sounds normal. No stridor. No respiratory distress. She has no wheezes. She has no rales.  Abdominal: Soft. Bowel sounds are normal. She exhibits no distension. There is no tenderness. There is no rebound and no guarding.  Musculoskeletal: She exhibits no edema or tenderness.  Neurological: She is alert and oriented to person, place, and time. She has normal strength. No cranial nerve deficit (No facial droop, extraocular movements intact, tongue midline ) or sensory deficit. She exhibits normal muscle tone. She displays no seizure activity. Coordination normal.  No pronator drift bilateral upper extrem, able to hold both legs off bed for 5 seconds, sensation intact in all extremities, no visual field cuts, no left or right sided neglect, normal finger-nose exam bilaterally, no nystagmus noted   Skin: Skin is warm and dry. No rash noted.  Psychiatric: She has a normal mood and affect.  Nursing note and vitals reviewed.   ED Course  Procedures (including critical care time) Labs Review Labs  Reviewed  CBC WITH DIFFERENTIAL/PLATELET - Abnormal; Notable for the following:    RBC 3.59 (*)    Hemoglobin 10.5 (*)    HCT 31.6 (*)    All other components within normal limits  BASIC METABOLIC PANEL - Abnormal; Notable for the following:    Potassium 2.8 (*)    Glucose, Bld 111 (*)    Calcium 8.4 (*)    All other components within normal limits    Imaging Review CT head : no acute findings I have personally reviewed and evaluated these images and lab results as part of my medical decision-making.   EKG Interpretation   Date/Time:  Saturday February 25 2015 19:50:21 EST Ventricular Rate:  65 PR Interval:    QRS Duration: 99 QT Interval:  434 QTC Calculation: 451 R Axis:   -25  Text Interpretation:  Junctional rhythm LVH with secondary repolarization  abnormality ED PHYSICIAN INTERPRETATION AVAILABLE IN [facility]  Confirmed by TEST, Record ([id number]) on 02/26/2015 8:14:53 AM      MDM   Final diagnoses:  Essential hypertension  Hypokalemia   Labs and x-rays were reviewed. The patient has a stable anemia. This is noncontributory. Potassium is decreased at 2.8. Could be contributing to some of her weakness symptoms. Patient's blood pressure is elevated.  Exam is unremarkable. She has no focal neurologic abnormalities. She is able to ambulate. I doubt stroke or other acute CNS event.  Doubt acute cardiac event, GI bleeding, dehydration, renal failure.  She was given additional dose of blood pressure medication. She was also given a potassium supplement I encouraged follow-up with her primary care doctor.    [name], MD 03/01/15 1239

## 2015-02-25 NOTE — ED Notes (Signed)
Patient seen here about one week for HTN, patient never followed up with outpatient physician. Called EMS c/o of high blood pressure times two days BP was 215/83 at EMS arrival.

## 2015-02-25 NOTE — Discharge Instructions (Signed)
Hypertension Hypertension, commonly called high blood pressure, is when the force of blood pumping through your arteries is too strong. Your arteries are the blood vessels that carry blood from your heart throughout your body. A blood pressure reading consists of a higher number over a lower number, such as 110/72. The higher number (systolic) is the pressure inside your arteries when your heart pumps. The lower number (diastolic) is the pressure inside your arteries when your heart relaxes. Ideally you want your blood pressure below 120/80. Hypertension forces your heart to work harder to pump blood. Your arteries may become narrow or stiff. Having untreated or uncontrolled hypertension can cause heart attack, stroke, kidney disease, and other problems. RISK FACTORS Some risk factors for high blood pressure are controllable. Others are not.  Risk factors you cannot control include:   Race. You may be at higher risk if you are African American.  Age. Risk increases with age.  Gender. Men are at higher risk than women before age 45 years. After age 65, women are at higher risk than men. Risk factors you can control include:  Not getting enough exercise or physical activity.  Being overweight.  Getting too much fat, sugar, calories, or salt in your diet.  Drinking too much alcohol. SIGNS AND SYMPTOMS Hypertension does not usually cause signs or symptoms. Extremely high blood pressure (hypertensive crisis) may cause headache, anxiety, shortness of breath, and nosebleed. DIAGNOSIS To check if you have hypertension, your health care provider will measure your blood pressure while you are seated, with your arm held at the level of your heart. It should be measured at least twice using the same arm. Certain conditions can cause a difference in blood pressure between your right and left arms. A blood pressure reading that is higher than normal on one occasion does not mean that you need treatment. If  it is not clear whether you have high blood pressure, you may be asked to return on a different day to have your blood pressure checked again. Or, you may be asked to monitor your blood pressure at home for 1 or more weeks. TREATMENT Treating high blood pressure includes making lifestyle changes and possibly taking medicine. Living a healthy lifestyle can help lower high blood pressure. You may need to change some of your habits. Lifestyle changes may include:  Following the DASH diet. This diet is high in fruits, vegetables, and whole grains. It is low in salt, red meat, and added sugars.  Keep your sodium intake below 2,300 mg per day.  Getting at least 30-45 minutes of aerobic exercise at least 4 times per week.  Losing weight if necessary.  Not smoking.  Limiting alcoholic beverages.  Learning ways to reduce stress. Your health care provider may prescribe medicine if lifestyle changes are not enough to get your blood pressure under control, and if one of the following is true:  You are 18-59 years of age and your systolic blood pressure is above 140.  You are 60 years of age or older, and your systolic blood pressure is above 150.  Your diastolic blood pressure is above 90.  You have diabetes, and your systolic blood pressure is over 140 or your diastolic blood pressure is over 90.  You have kidney disease and your blood pressure is above 140/90.  You have heart disease and your blood pressure is above 140/90. Your personal target blood pressure may vary depending on your medical conditions, your age, and other factors. HOME CARE INSTRUCTIONS    Have your blood pressure rechecked as directed by your health care provider.   Take medicines only as directed by your health care provider. Follow the directions carefully. Blood pressure medicines must be taken as prescribed. The medicine does not work as well when you skip doses. Skipping doses also puts you at risk for  problems.  Do not smoke.   Monitor your blood pressure at home as directed by your health care provider. SEEK MEDICAL CARE IF:   You think you are having a reaction to medicines taken.  You have recurrent headaches or feel dizzy.  You have swelling in your ankles.  You have trouble with your vision. SEEK IMMEDIATE MEDICAL CARE IF:  You develop a severe headache or confusion.  You have unusual weakness, numbness, or feel faint.  You have severe chest or abdominal pain.  You vomit repeatedly.  You have trouble breathing. MAKE SURE YOU:   Understand these instructions.  Will watch your condition.  Will get help right away if you are not doing well or get worse.   This information is not intended to replace advice given to you by your health care provider. Make sure you discuss any questions you have with your health care provider.   Document Released: 03/18/2005 Document Revised: 08/02/2014 Document Reviewed: 01/08/2013 Elsevier Interactive Patient Education 2016 [employer]  

## 2015-04-25 ENCOUNTER — Ambulatory Visit (INDEPENDENT_AMBULATORY_CARE_PROVIDER_SITE_OTHER): Payer: Self-pay | Admitting: Internal Medicine

## 2015-06-22 ENCOUNTER — Other Ambulatory Visit (HOSPITAL_COMMUNITY): Payer: Self-pay | Admitting: Internal Medicine

## 2015-06-22 ENCOUNTER — Ambulatory Visit (HOSPITAL_COMMUNITY)
Admission: RE | Admit: 2015-06-22 | Discharge: 2015-06-22 | Disposition: A | Discharge status: Home / Self Care | Payer: Medicare Other | Source: Ambulatory Visit | Attending: Internal Medicine | Admitting: Internal Medicine

## 2015-06-22 DIAGNOSIS — M79605 Pain in left leg: Secondary | ICD-10-CM

## 2015-06-22 DIAGNOSIS — M85862 Other specified disorders of bone density and structure, left lower leg: Secondary | ICD-10-CM | POA: Insufficient documentation

## 2015-06-22 IMAGING — DX DG TIBIA/FIBULA 2V*L*
2 series · 2 of 2 positions shown · non-contrast
Comparison: None.

CLINICAL DATA: Shooting pain starting in left knee radiating down
to left ankle and up to left hip. Denies recent injury. Claims
injury to left leg 15 years ago.

EXAM:
LEFT TIBIA AND FIBULA - 2 VIEW

[tibia ap (1 of 2)]
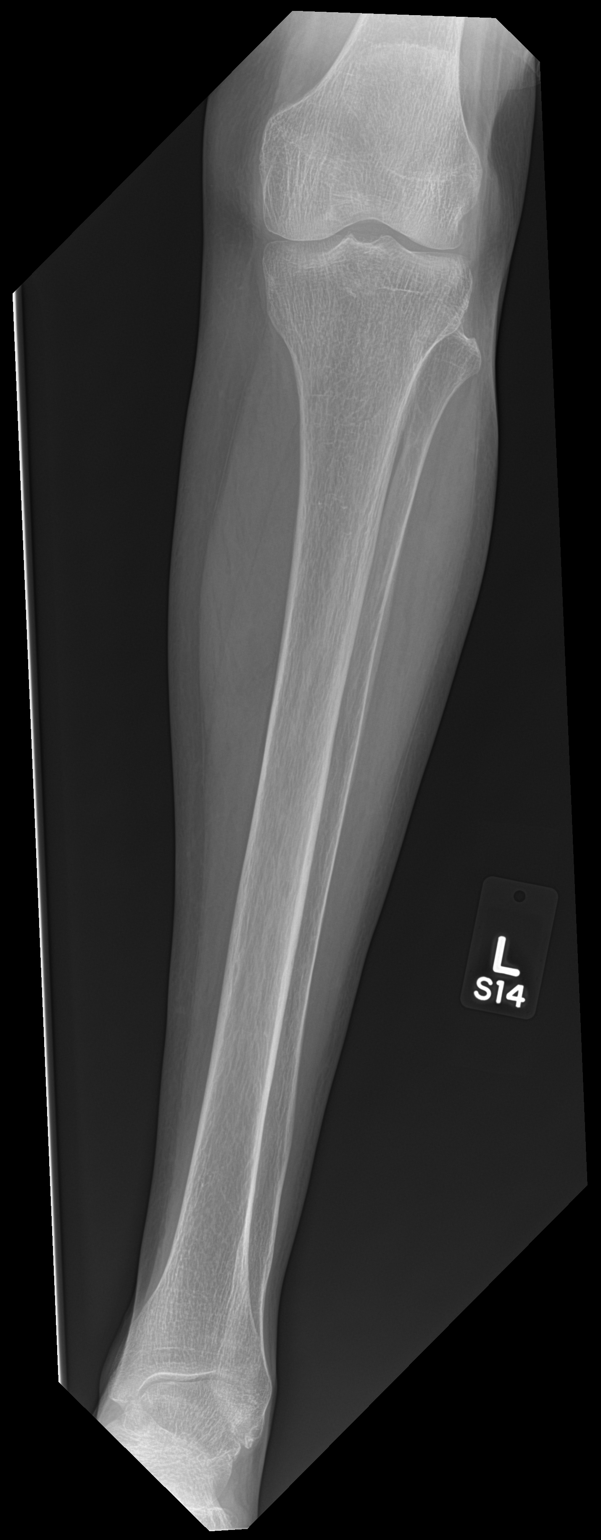

[tibia ap (2 of 2)]
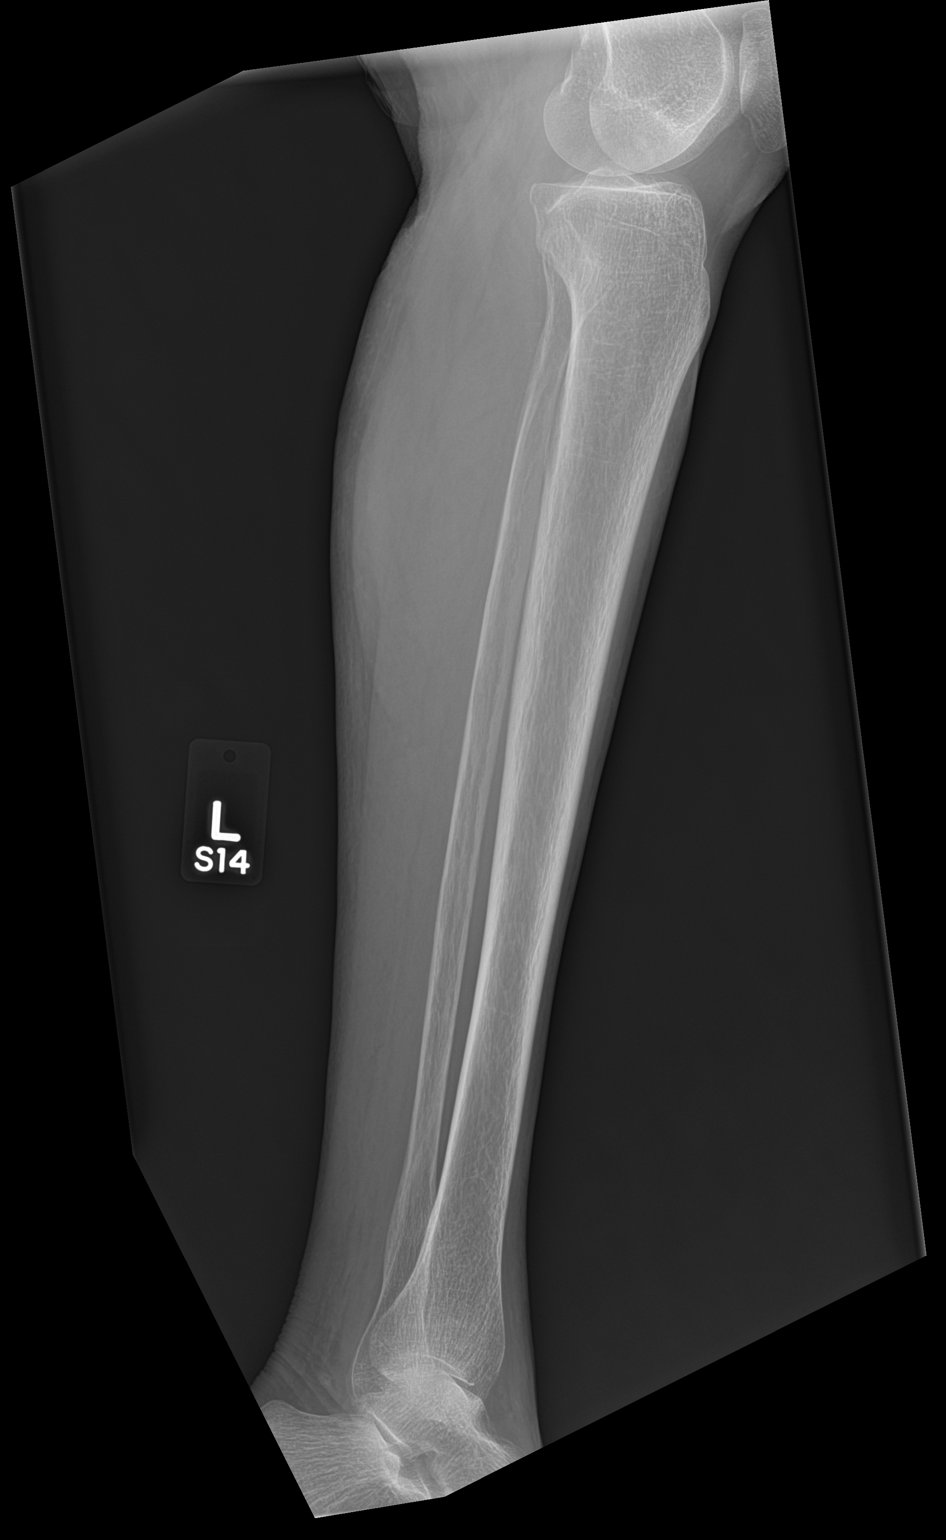

[2 of 2 positions shown; findings below may reference images not displayed]

FINDINGS: There is at least mild osteopenia throughout which limits
characterization of osseous detail but there is no fracture line or
displaced fracture fragment identified. No acute or suspicious
osseous lesions seen. No significant degenerative change seen.
Adjacent soft tissues are unremarkable.
IMPRESSION: Osteopenia.  No acute findings.

## 2015-09-24 ENCOUNTER — Emergency Department (HOSPITAL_COMMUNITY)
Admission: EM | Admit: 2015-09-24 | Discharge: 2015-09-24 | Disposition: A | Discharge status: Home / Self Care | Payer: Medicare Other | Attending: Emergency Medicine | Admitting: Emergency Medicine

## 2015-09-24 ENCOUNTER — Encounter (HOSPITAL_COMMUNITY): Payer: Self-pay | Admitting: *Deleted

## 2015-09-24 DIAGNOSIS — Z7982 Long term (current) use of aspirin: Secondary | ICD-10-CM | POA: Diagnosis not present

## 2015-09-24 DIAGNOSIS — K5900 Constipation, unspecified: Secondary | ICD-10-CM | POA: Diagnosis not present

## 2015-09-24 DIAGNOSIS — Z85820 Personal history of malignant melanoma of skin: Secondary | ICD-10-CM | POA: Insufficient documentation

## 2015-09-24 DIAGNOSIS — R109 Unspecified abdominal pain: Secondary | ICD-10-CM | POA: Diagnosis present

## 2015-09-24 DIAGNOSIS — Z79899 Other long term (current) drug therapy: Secondary | ICD-10-CM | POA: Insufficient documentation

## 2015-09-24 DIAGNOSIS — I10 Essential (primary) hypertension: Secondary | ICD-10-CM | POA: Insufficient documentation

## 2015-09-24 DIAGNOSIS — E785 Hyperlipidemia, unspecified: Secondary | ICD-10-CM | POA: Diagnosis not present

## 2015-09-24 LAB — POC OCCULT BLOOD, ED: Fecal Occult Bld: NEGATIVE

## 2015-09-24 MED ORDER — ONDANSETRON 4 MG PO TBDP
4.0000 mg | ORAL_TABLET | Freq: Once | ORAL | Status: AC
  Administered 2015-09-24: 4 mg via ORAL
  Filled 2015-09-24: qty 1

## 2015-09-24 NOTE — Discharge Instructions (Signed)
Recheck as needed. You may still have some abdominal spasms that should fade out during the morning. Recheck if you feel worse.

## 2015-09-24 NOTE — ED Notes (Signed)
Pt arrived by EMS from home. Pt states abdominal that started a couple hours ago w/ nausea, denies vomiting.

## 2015-09-24 NOTE — ED Notes (Signed)
Pt denying pain, & says nausea is better.

## 2015-09-24 NOTE — ED Provider Notes (Signed)
CSN: [id number]     Arrival date & time 09/24/15  0439 History   First MD Initiated Contact with Patient 09/24/15 0450 AM    Chief Complaint  Patient presents with  . Abdominal Pain     (Consider location/radiation/quality/duration/timing/severity/associated sxs/prior Treatment) HPI  patient states she hadn't had a bowel movement in 3 days. She told me she took a laxative, bisacodyl 5 mg at 10 PM but her daughters arrived and states it was at 2 PM yesterday. She states she woke up at 3:30 in the morning and had diffuse abdominal pain and sweating. She had nausea without vomiting. She states about an hour later she passed a very large bowel movement that she states was loose. The daughter states she wears depends and there was no blood in the diarrhea and the bowel movement was not black. However they state "it smelled like blood". They report she has a chronic history of abdominal pain and diarrhea and leading peptic ulcers. Patient denies any pain now. She states her abdominal bloating that she did have is gone now. She states she still has some mild nausea.  PCP Dr Merlyn Albert GI Dr Laural Golden  Past Medical History  Diagnosis Date  . Hypertension   . Hyperlipemia   . Gastric ulcer   . Chronic diarrhea   . Vertigo   . Sliding hiatal hernia   . Schatzki's ring   . Duodenal stricture   . GI bleed   . Microscopic colitis   . Acute blood loss anemia   . Dilation of biliary tract     biliary duct per 02/26/13 CT  . Pancreatic duct dilated ([place]) 02/26/13  . Esophageal stricture   . Skin cancer   . Nephrolithiasis    Past Surgical History  Procedure Laterality Date  . Nose surgery      for skin cancer  . Esophagogastroduodenoscopy N/A 02/27/2013    Procedure: ESOPHAGOGASTRODUODENOSCOPY (EGD);  Surgeon: Ladene Artist, MD;  Location: [facility] ENDOSCOPY;  Service: Endoscopy;  Laterality: N/A;  . Hot hemostasis N/A 02/27/2013    Procedure: HOT HEMOSTASIS (ARGON PLASMA COAGULATION/BICAP);  Surgeon:  Ladene Artist, MD;  Location: [facility] ENDOSCOPY;  Service: Endoscopy;  Laterality: N/A;  . Esophagogastroduodenoscopy N/A 03/28/2013    Procedure: ESOPHAGOGASTRODUODENOSCOPY (EGD);  Surgeon: Rogene [place], MD;  Location: [facility] ENDO SUITE;  Service: Endoscopy;  Laterality: N/A;   Family History  Problem Relation Age of Onset  . Heart attack Father    Social History  Substance Use Topics  . Smoking status: Never Smoker   . Smokeless tobacco: Never Used  . Alcohol Use: No   Lives with daughter  OB History    Gravida Para Term Preterm AB TAB SAB Ectopic Multiple Living   6 6 6       6      Review of Systems  All other systems reviewed and are negative.     Allergies  Codeine and Sulfa antibiotics  Home Medications   Prior to Admission medications   Medication Sig Start Date End Date Taking? Authorizing Provider  ALPRAZolam (XANAX) 0.25 MG tablet Take 0.25 mg by mouth 2 (two) times daily as needed. for anxiety 02/17/15  Yes Historical Provider, MD  aspirin EC 81 MG tablet Take 81 mg by mouth daily.   Yes Historical Provider, MD  bisacodyl (DULCOLAX) 5 MG EC tablet Take 5 mg by mouth daily as needed for moderate constipation.   Yes Historical Provider, MD  cholestyramine Lucrezia Starch) 4 GM/DOSE powder  Take 4 g by mouth daily as needed (for constipation).  01/12/14  Yes Historical Provider, MD  furosemide (LASIX) 20 MG tablet Take 20 mg by mouth daily. 03/08/14  Yes Historical Provider, MD  loperamide (IMODIUM) 2 MG capsule Take 2 mg by mouth as needed for diarrhea or loose stools.  02/26/14  Yes Historical Provider, MD  losartan (COZAAR) 50 MG tablet Take 50 mg by mouth daily.   Yes Historical Provider, MD  meclizine (ANTIVERT) 25 MG tablet Take 25 mg by mouth 3 (three) times daily as needed for dizziness.  03/15/14  Yes Historical Provider, MD  pantoprazole (PROTONIX) 40 MG tablet Take 40 mg by mouth every morning.    Yes Historical Provider, MD  potassium chloride (K-DUR,KLOR-CON) 10  MEQ tablet Take 2 tablets (20 mEq total) by mouth 2 (two) times daily. 02/25/15  Yes [name], MD  traMADol (ULTRAM) 50 MG tablet Take 1 tablet (50 mg total) by mouth every 6 (six) hours as needed. 11/16/14  Yes Deno Etienne, DO  amLODipine (NORVASC) 5 MG tablet Take 1 tablet (5 mg total) by mouth daily. 02/25/15   [name], MD  cephALEXin (KEFLEX) 500 MG capsule Take 1 capsule (500 mg total) by mouth 4 (four) times daily. 02/16/15   Milton Ferguson, MD  citalopram (CELEXA) 10 MG tablet Take 10 mg by mouth daily. 02/17/15   Historical Provider, MD   BP 143/61 mmHg  Pulse 62  Temp(Src) 97.6 F (36.4 C) (Oral)  Resp 22  Ht 5\' 6"  (1.676 m)  Wt 120 lb (54.432 kg)  BMI 19.38 kg/m2  SpO2 99% Physical Exam  Constitutional: She is oriented to person, place, and time. She appears well-developed and well-nourished.  Non-toxic appearance. She does not appear ill. No distress.  HENT:  Head: Normocephalic and atraumatic.  Right Ear: External ear normal.  Left Ear: External ear normal.  Nose: Nose normal. No mucosal edema or rhinorrhea.  Mouth/Throat: Oropharynx is clear and moist and mucous membranes are normal. No dental abscesses or uvula swelling.  Has had surgery to her nose with major loss of soft tissue and scarring seen  Eyes: Conjunctivae and EOM are normal. Pupils are equal, round, and reactive to light.  Neck: Normal range of motion and full passive range of motion without pain. Neck supple.  Cardiovascular: Normal rate, regular rhythm and normal heart sounds.  Exam reveals no gallop and no friction rub.   No murmur heard. Pulmonary/Chest: Effort normal and breath sounds normal. No respiratory distress. She has no wheezes. She has no rhonchi. She has no rales. She exhibits no tenderness and no crepitus.  Abdominal: Soft. Normal appearance and bowel sounds are normal. She exhibits no distension. There is no tenderness. There is no rebound and no guarding.  Musculoskeletal: Normal range of  motion. She exhibits no edema or tenderness.  Moves all extremities well.   Neurological: She is alert and oriented to person, place, and time. She has normal strength. No cranial nerve deficit.  Skin: Skin is warm, dry and intact. No rash noted. No erythema. No pallor.  Psychiatric: She has a normal mood and affect. Her speech is normal and behavior is normal. Her mood appears not anxious.  Nursing note and vitals reviewed.   ED Course  Procedures (including critical care time)  Medications  ondansetron (ZOFRAN-ODT) disintegrating tablet 4 mg (4 mg Oral Given 09/24/15 0508)   No testing was done since her pain was resolved at the time of her arrival in the  ED. She was given Zofran for her nausea.  06:30 AM, nausea is gone, having mild abdominal cramping, going to the bathroom.   7 AM. Patient states she passed a small amount of stool. She does not having abdominal pain at this time. She has no nausea or vomiting. She feels ready to go home. She states she's had diarrhea off and on her whole life. I am not going to advise her for any other treatment for her constipation since she has a long history of diarrhea. Hopefully the laxative she took will resolve her constipation, which it sounds like it already has.  Labs Review Results for orders placed or performed during the hospital encounter of 09/24/15  POC occult blood, ED RN will collect  Result Value Ref Range   Fecal Occult Bld NEGATIVE NEGATIVE         MDM   Final diagnoses:  Abdominal pain, unspecified abdominal location  Constipation, unspecified constipation type   Plan discharge  Rolland Porter, MD, Barbette Or, MD 09/24/15 [phone]

## 2015-09-24 NOTE — ED Notes (Signed)
Pt states took a laxative last night before going to bed.

## 2016-05-21 DIAGNOSIS — R252 Cramp and spasm: Secondary | ICD-10-CM | POA: Diagnosis not present

## 2016-05-21 DIAGNOSIS — R42 Dizziness and giddiness: Secondary | ICD-10-CM | POA: Diagnosis not present

## 2016-05-21 DIAGNOSIS — G4700 Insomnia, unspecified: Secondary | ICD-10-CM | POA: Diagnosis not present

## 2016-05-21 DIAGNOSIS — R110 Nausea: Secondary | ICD-10-CM | POA: Diagnosis not present

## 2016-06-15 DIAGNOSIS — K219 Gastro-esophageal reflux disease without esophagitis: Secondary | ICD-10-CM | POA: Diagnosis not present

## 2016-06-15 DIAGNOSIS — R197 Diarrhea, unspecified: Secondary | ICD-10-CM | POA: Diagnosis not present

## 2016-06-15 DIAGNOSIS — E785 Hyperlipidemia, unspecified: Secondary | ICD-10-CM | POA: Diagnosis not present

## 2016-06-15 DIAGNOSIS — R110 Nausea: Secondary | ICD-10-CM | POA: Diagnosis not present

## 2016-06-15 DIAGNOSIS — G43809 Other migraine, not intractable, without status migrainosus: Secondary | ICD-10-CM | POA: Diagnosis not present

## 2016-06-15 DIAGNOSIS — I10 Essential (primary) hypertension: Secondary | ICD-10-CM | POA: Diagnosis not present

## 2016-06-15 DIAGNOSIS — G43909 Migraine, unspecified, not intractable, without status migrainosus: Secondary | ICD-10-CM | POA: Diagnosis not present

## 2016-06-15 DIAGNOSIS — Z79899 Other long term (current) drug therapy: Secondary | ICD-10-CM | POA: Diagnosis not present

## 2016-07-08 DIAGNOSIS — E785 Hyperlipidemia, unspecified: Secondary | ICD-10-CM | POA: Diagnosis not present

## 2016-07-08 DIAGNOSIS — I10 Essential (primary) hypertension: Secondary | ICD-10-CM | POA: Diagnosis not present

## 2016-07-08 DIAGNOSIS — D509 Iron deficiency anemia, unspecified: Secondary | ICD-10-CM | POA: Diagnosis not present

## 2016-07-22 DIAGNOSIS — E782 Mixed hyperlipidemia: Secondary | ICD-10-CM | POA: Diagnosis not present

## 2016-07-22 DIAGNOSIS — I129 Hypertensive chronic kidney disease with stage 1 through stage 4 chronic kidney disease, or unspecified chronic kidney disease: Secondary | ICD-10-CM | POA: Diagnosis not present

## 2016-07-22 DIAGNOSIS — K219 Gastro-esophageal reflux disease without esophagitis: Secondary | ICD-10-CM | POA: Diagnosis not present

## 2016-07-22 DIAGNOSIS — D509 Iron deficiency anemia, unspecified: Secondary | ICD-10-CM | POA: Diagnosis not present

## 2016-09-20 DIAGNOSIS — H9203 Otalgia, bilateral: Secondary | ICD-10-CM | POA: Diagnosis not present

## 2016-09-20 DIAGNOSIS — Z6821 Body mass index (BMI) 21.0-21.9, adult: Secondary | ICD-10-CM | POA: Diagnosis not present

## 2016-09-20 DIAGNOSIS — I129 Hypertensive chronic kidney disease with stage 1 through stage 4 chronic kidney disease, or unspecified chronic kidney disease: Secondary | ICD-10-CM | POA: Diagnosis not present

## 2016-10-15 ENCOUNTER — Emergency Department (HOSPITAL_COMMUNITY)
Admission: EM | Admit: 2016-10-15 | Discharge: 2016-10-15 | Disposition: A | Discharge status: Home / Self Care | Payer: Medicare Other | Attending: Emergency Medicine | Admitting: Emergency Medicine

## 2016-10-15 ENCOUNTER — Encounter (HOSPITAL_COMMUNITY): Payer: Self-pay | Admitting: Emergency Medicine

## 2016-10-15 DIAGNOSIS — N8110 Cystocele, unspecified: Secondary | ICD-10-CM | POA: Diagnosis not present

## 2016-10-15 DIAGNOSIS — Z85828 Personal history of other malignant neoplasm of skin: Secondary | ICD-10-CM | POA: Diagnosis not present

## 2016-10-15 DIAGNOSIS — I5031 Acute diastolic (congestive) heart failure: Secondary | ICD-10-CM | POA: Insufficient documentation

## 2016-10-15 DIAGNOSIS — I110 Hypertensive heart disease with heart failure: Secondary | ICD-10-CM | POA: Diagnosis not present

## 2016-10-15 DIAGNOSIS — N898 Other specified noninflammatory disorders of vagina: Secondary | ICD-10-CM | POA: Diagnosis present

## 2016-10-15 DIAGNOSIS — N3001 Acute cystitis with hematuria: Secondary | ICD-10-CM | POA: Diagnosis not present

## 2016-10-15 DIAGNOSIS — E1129 Type 2 diabetes mellitus with other diabetic kidney complication: Secondary | ICD-10-CM | POA: Diagnosis not present

## 2016-10-15 LAB — URINALYSIS, ROUTINE W REFLEX MICROSCOPIC
Bilirubin Urine: NEGATIVE
Glucose, UA: NEGATIVE mg/dL
Ketones, ur: NEGATIVE mg/dL
Nitrite: NEGATIVE
Protein, ur: NEGATIVE mg/dL
Specific Gravity, Urine: <1.005 — ABNORMAL LOW (ref 1.005–1.030)
pH: 5.5 (ref 5.0–8.0)

## 2016-10-15 LAB — URINALYSIS, MICROSCOPIC (REFLEX)

## 2016-10-15 MED ORDER — CEPHALEXIN 500 MG PO CAPS: 500.0000 mg | ORAL_CAPSULE | Freq: Three times a day (TID) | ORAL | 0 refills | Status: AC

## 2016-10-15 NOTE — Discharge Instructions (Signed)
You were seen in the ED today with a bladder prolapse. You will need to see an OB/Gyn for further treatment. We are treating a UTI that we found on the urine test.   Return to the ED with any worsening pain, fever, chills, or vaginal bleeding.

## 2016-10-15 NOTE — ED Provider Notes (Signed)
Emergency Department Provider Note   I have reviewed the triage vital signs and the nursing notes.   HISTORY  Chief Complaint Vaginal Problem   HPI Renee Blake is a 83 y.o. female with PMH of chronic diarrhea, HLD, and HTN presents to the emergency department for evaluation of vaginal fullness and protruding mass for the last month. She reports a similar history of this in the past that was not as severe. She states that she saw someone at the [facility] for this several years ago but was told it was not severe enough for intervention. She notices worsening bulging with standing or walking. She denies any specific pain but states there is a pressure and fullness. She denies any dysuria, hesitancy, urgency. No rectal pain. She continues to have bowel movements normally. She has seen a small amount of blood when wiping after urination. No radiation of symptoms.    Past Medical History:  Diagnosis Date  . Acute blood loss anemia   . Chronic diarrhea   . Dilation of biliary tract    biliary duct per 02/26/13 CT  . Duodenal stricture   . Esophageal stricture   . Gastric ulcer   . GI bleed   . Hyperlipemia   . Hypertension   . Microscopic colitis   . Nephrolithiasis   . Pancreatic duct dilated 02/26/13  . Schatzki's ring   . Skin cancer   . Sliding hiatal hernia   . Vertigo     Patient Active Problem List   Diagnosis Date Noted  . Erroneous encounter - disregard 03/17/2015  . AKI (acute kidney injury) ([place])   . Metabolic acidosis 27/25/3664  . Acute renal failure syndrome ([place])   . Renal failure 08/22/2014  . Hyperkalemia 08/22/2014  . UTI (urinary tract infection) 08/22/2014  . Acute renal failure ([place]) 08/22/2014  . Dyspnea 03/15/2014  . Diabetes mellitus ([place]) 06/08/2013  . Acute diastolic congestive heart failure ([place]) 06/07/2013  . Syncope 03/26/2013  . Rectal bleeding 03/26/2013  . Dehydration 03/26/2013  . Generalized weakness 03/26/2013  .  Malnutrition of moderate degree ([place]) 03/01/2013  . Diarrhea 02/28/2013  . Loss of weight 02/28/2013  . Acute duodenal ulcer with hemorrhage, without mention of obstruction 02/27/2013  . GIB (gastrointestinal bleeding) 02/26/2013  . Acute blood loss anemia 02/26/2013  . UTI (lower urinary tract infection) 02/26/2013  . Hypertension 02/26/2013    Past Surgical History:  Procedure Laterality Date  . ESOPHAGOGASTRODUODENOSCOPY N/A 02/27/2013   Procedure: ESOPHAGOGASTRODUODENOSCOPY (EGD);  Surgeon: Ladene Artist, MD;  [facility];  Service: Endoscopy;  Laterality: N/A;  . ESOPHAGOGASTRODUODENOSCOPY N/A 03/28/2013   Procedure: ESOPHAGOGASTRODUODENOSCOPY (EGD);  Surgeon: Rogene [place], MD;  [facility] ENDO [facility];  Service: Endoscopy;  Laterality: N/A;  . HOT HEMOSTASIS N/A 02/27/2013   Procedure: HOT HEMOSTASIS (ARGON PLASMA COAGULATION/BICAP);  Surgeon: Ladene Artist, MD;  Location: [facility] ENDOSCOPY;  Service: Endoscopy;  Laterality: N/A;  . NOSE SURGERY     for skin cancer    Current Outpatient Rx  . Order #: [id number] Class: Historical Med  . Order #: [id number] Class: Historical Med  . Order #: [id number] Class: Historical Med  . Order #: [id number] Class: Historical Med  . Order #: [id number] Class: Historical Med  . Order #: [id number] Class: Historical Med  . Order #: [id number] Class: Historical Med  . Order #: [id number] Class: Historical Med  . Order #: [id number] Class: Historical Med  . Order #: [id number] Class: Historical Med  . Order #: [id number] Class: Historical Med  . Order #:  [id number] Class: Print  . Order #: [id number] Class: Print  . Order #: [id number] Class: Print  . Order #: [id number] Class: Historical Med  . Order #: [id number] Class: Print  . Order #: [id number] Class: Print  . Order #: [id number] Class: Historical Med  . Order #: [id number] Class: Historical Med    Allergies Codeine and Sulfa antibiotics  Family History  Problem Relation Age of Onset  . Heart  attack Father     Social History Social History  Substance Use Topics  . Smoking status: Never Smoker  . Smokeless tobacco: Never Used  . Alcohol use No    Review of Systems  Constitutional: No fever/chills Eyes: No visual changes. ENT: No sore throat. Cardiovascular: Denies chest pain. Respiratory: Denies shortness of breath. Gastrointestinal: No abdominal pain.  No nausea, no vomiting.  No diarrhea.  No constipation. Genitourinary: Negative for dysuria. Positive pelvic fullness.  Musculoskeletal: Negative for back pain. Skin: Negative for rash. Neurological: Negative for headaches, focal weakness or numbness.  10-point ROS otherwise negative.  ____________________________________________   PHYSICAL EXAM:  VITAL SIGNS: ED Triage Vitals  Enc Vitals Group     BP 10/15/16 1044 (!) 171/94     Pulse Rate 10/15/16 1055 95     Resp 10/15/16 1044 20     Temp 10/15/16 1044 97.8 F (36.6 C)     Temp Source 10/15/16 1044 Oral     SpO2 10/15/16 1055 99 %     Weight 10/15/16 1046 120 lb (54.4 kg)     Height 10/15/16 1046 5\' 6"  (1.676 m)   Constitutional: Alert and oriented. Well appearing and in no acute distress. Eyes: Conjunctivae are normal.  Head: Atraumatic. Nose: No congestion/rhinnorhea. Mouth/Throat: Mucous membranes are moist.  Oropharynx non-erythematous. Neck: No stridor.   Cardiovascular: Normal rate, regular rhythm. Good peripheral circulation. Grossly normal heart sounds.   Respiratory: Normal respiratory effort.  No retractions. Lungs CTAB. Gastrointestinal: Soft and nontender. No distention.  Genitourinary: Moderate prolapsing mass protruding from superior position. Normal rectum. No gross blood is abnormal tissue coloration.  Musculoskeletal: No lower extremity tenderness nor edema. No gross deformities of extremities. Neurologic:  Normal speech and language. No gross focal neurologic deficits are appreciated.  Skin:  Skin is warm, dry and intact. No rash  noted. Psychiatric: Mood and affect are normal. Speech and behavior are normal.  ____________________________________________   LABS (all labs ordered are listed, but only abnormal results are displayed)  Labs Reviewed  URINALYSIS, ROUTINE W REFLEX MICROSCOPIC - Abnormal; Notable for the following:       Result Value   Specific Gravity, Urine <1.005 (*)    Hgb urine dipstick MODERATE (*)    Leukocytes, UA MODERATE (*)    All other components within normal limits  URINALYSIS, MICROSCOPIC (REFLEX) - Abnormal; Notable for the following:    Bacteria, UA FEW (*)    Squamous Epithelial / LPF TOO NUMEROUS TO COUNT (*)    All other components within normal limits    ____________________________________________   PROCEDURES  Procedure(s) performed:   Procedures  None ____________________________________________   INITIAL IMPRESSION / ASSESSMENT AND PLAN / ED COURSE  Pertinent labs & imaging results that were available during my care of the patient were reviewed by me and considered in my medical decision making (see chart for details).  Patient presents to the emergency department for evaluation of bladder prolapse. There is no sign of strangulation or infection. Not consistent with a rectal prolapse. The mass is easily reducible without pain but returns with Valsalva. Patient  does report a small amount of bleeding which may be coming from slightly friable mucosa although this is not directly visualized. Plan for urinalysis to rule out infection. Discussed several options for dealing with this issue but these are better evaluated and implemented by gynecologist. Plan for referral at discharge.   12:01 PM Patient with evidence of UTI. Will treat with Keflex. Referred to Gyn for further evaluation of mild/moderate bladder prolapse.   At this time, I do not feel there is any life-threatening condition present. I have reviewed and discussed all results (EKG, imaging, lab, urine as  appropriate), exam findings with patient. I have reviewed nursing notes and appropriate previous records.  I feel the patient is safe to be discharged home without further emergent workup. Discussed usual and customary return precautions. Patient and family (if present) verbalize understanding and are comfortable with this plan.  Patient will follow-up with their primary care provider. If they do not have a primary care provider, information for follow-up has been provided to them. All questions have been answered.  ____________________________________________  FINAL CLINICAL IMPRESSION(S) / ED DIAGNOSES  Final diagnoses:  Bladder prolapse, female, acquired  Acute cystitis with hematuria     MEDICATIONS GIVEN DURING THIS VISIT:  Medications - No data to display   NEW OUTPATIENT MEDICATIONS STARTED DURING THIS VISIT:  New Prescriptions   CEPHALEXIN (KEFLEX) 500 MG CAPSULE    Take 1 capsule (500 mg total) by mouth 3 (three) times daily.      Note:  This document was prepared using Dragon voice recognition software and may include unintentional dictation errors.  Nanda Quinton, MD Emergency Medicine    Long, [facility], MD 10/15/16 [phone]

## 2016-10-15 NOTE — ED Triage Notes (Signed)
Patient states "I feel like my bladder is dropping out." States she has had this problem x 1 month but worsened today. Denies pain at this time.

## 2016-11-13 ENCOUNTER — Ambulatory Visit (INDEPENDENT_AMBULATORY_CARE_PROVIDER_SITE_OTHER): Payer: Medicare Other | Admitting: Obstetrics & Gynecology

## 2016-11-13 VITALS — BP 151/78 | HR 72 | Wt 118.5 lb

## 2016-11-13 DIAGNOSIS — N813 Complete uterovaginal prolapse: Secondary | ICD-10-CM | POA: Diagnosis not present

## 2016-11-13 NOTE — Progress Notes (Signed)
   Subjective:    Patient ID: Renee Blake, female    DOB: 1928/11/08, 83 y.o.   MRN: [id number]  HPI  83 yo WW P6 (all vaginal deliveries) here today with the complaint of "I reckon my bladder has dropped". For about a year. Wears pullups daily for about 2 years due to chronic diarrhea with fecal incontinence. She has some bladder incontinence but not daily.  Review of Systems Not sexually active. Lives with a daughter.    Objective:   Physical Exam Thin pleasant white female Breathing, conversing, and ambulating normally Complete procedentia noted       Assessment & Plan:  Complete procedentia with occasional urinary incontinence and daily fecal incontinence I offered her a pessary versus a referral to Dr. Maryland Pink She opts for the referral

## 2016-11-15 ENCOUNTER — Telehealth: Payer: Self-pay | Admitting: Lab

## 2016-11-15 NOTE — Telephone Encounter (Signed)
Gershon Mussel called stating that [name] was seen by Dr. Hulan Fray. She wanted to know if Dr. Hulan Fray  schedule her mother an appt with Dr. Tyrone Schimke.

## 2016-11-18 ENCOUNTER — Telehealth: Payer: Self-pay | Admitting: Lab

## 2016-11-18 NOTE — Telephone Encounter (Signed)
See note for 8/20

## 2016-11-18 NOTE — Telephone Encounter (Signed)
Gershon Mussel called stating that [name] was seen by Dr.Dove. She wanted to know if Dr.Dove scheduled her mother appt with Dr. Delphia Grates.

## 2016-11-19 NOTE — Telephone Encounter (Signed)
Error Encounter

## 2016-11-20 NOTE — Telephone Encounter (Signed)
Called Dr Maryland Pink office and appt has already been scheduled for 10/17 @ 1030 at [place] office. Called patient's daughter and informed her of appt, address, & phone number. She verbalized understanding & had no questions

## 2016-12-09 ENCOUNTER — Emergency Department (HOSPITAL_COMMUNITY): Payer: Medicare Other

## 2016-12-09 ENCOUNTER — Encounter (HOSPITAL_COMMUNITY): Payer: Self-pay | Admitting: Emergency Medicine

## 2016-12-09 ENCOUNTER — Observation Stay (HOSPITAL_COMMUNITY)
Admission: EM | Admit: 2016-12-09 | Discharge: 2016-12-10 | Disposition: A | Discharge status: Home / Self Care | Payer: Medicare Other | Attending: Internal Medicine | Admitting: Internal Medicine

## 2016-12-09 DIAGNOSIS — I5031 Acute diastolic (congestive) heart failure: Secondary | ICD-10-CM | POA: Diagnosis not present

## 2016-12-09 DIAGNOSIS — R229 Localized swelling, mass and lump, unspecified: Secondary | ICD-10-CM | POA: Diagnosis not present

## 2016-12-09 DIAGNOSIS — L03211 Cellulitis of face: Principal | ICD-10-CM | POA: Diagnosis present

## 2016-12-09 DIAGNOSIS — R51 Headache: Secondary | ICD-10-CM | POA: Insufficient documentation

## 2016-12-09 DIAGNOSIS — I110 Hypertensive heart disease with heart failure: Secondary | ICD-10-CM | POA: Diagnosis not present

## 2016-12-09 DIAGNOSIS — Z7982 Long term (current) use of aspirin: Secondary | ICD-10-CM | POA: Insufficient documentation

## 2016-12-09 DIAGNOSIS — R600 Localized edema: Secondary | ICD-10-CM | POA: Diagnosis present

## 2016-12-09 DIAGNOSIS — E119 Type 2 diabetes mellitus without complications: Secondary | ICD-10-CM

## 2016-12-09 DIAGNOSIS — Z79899 Other long term (current) drug therapy: Secondary | ICD-10-CM | POA: Insufficient documentation

## 2016-12-09 DIAGNOSIS — R011 Cardiac murmur, unspecified: Secondary | ICD-10-CM | POA: Insufficient documentation

## 2016-12-09 DIAGNOSIS — I10 Essential (primary) hypertension: Secondary | ICD-10-CM | POA: Diagnosis present

## 2016-12-09 IMAGING — CT CT MAXILLOFACIAL W/O CM
3 series · 16 of 47 positions shown, 19 images · non-contrast
Comparison: None.

CLINICAL DATA: Facial swelling and redness since [DATE].
Headache.

EXAM:
CT MAXILLOFACIAL WITHOUT CONTRAST
TECHNIQUE: Multidetector CT imaging of the maxillofacial structures was
performed. Multiplanar CT image reconstructions were also generated.

[Series 2: max soft · axial · 0.33mm/px · z∈[+1446,+1584]mm · 10 of 81 slices shown, 13 images]
[im 6/81  brain]
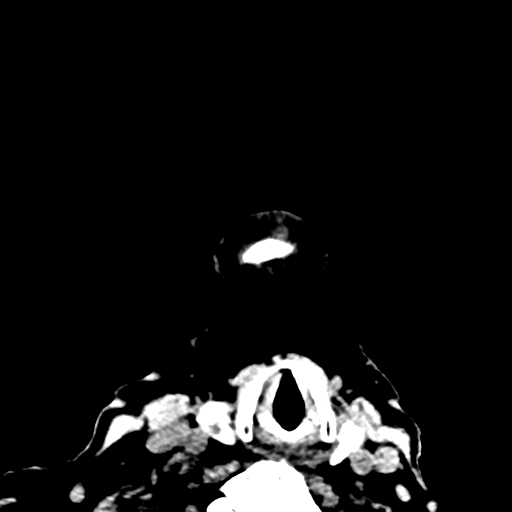
[im 6/81  bone]
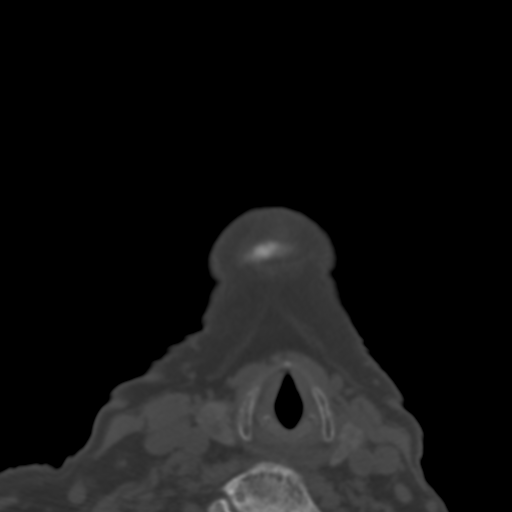
[im 14/81  bone]
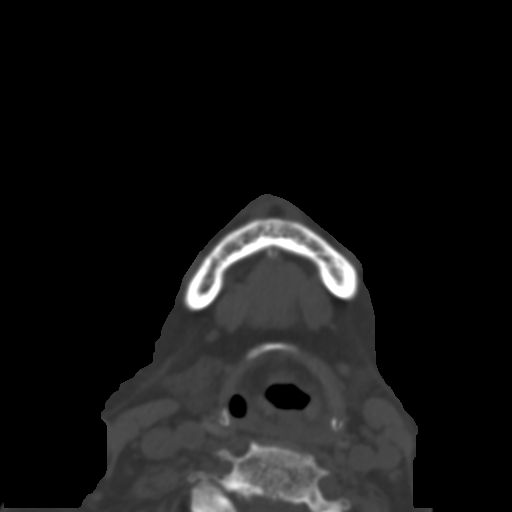
[im 23/81  bone]
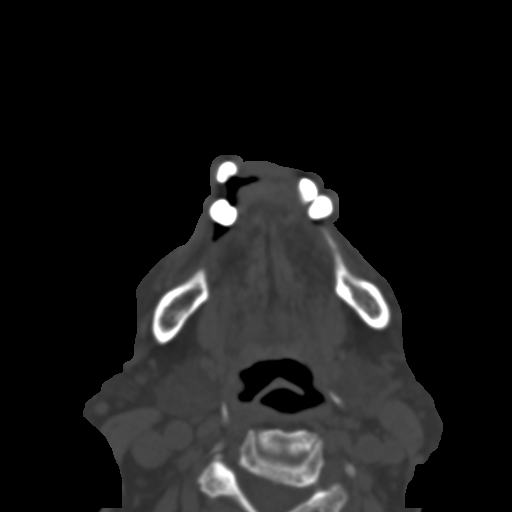
[im 28/81  bone]
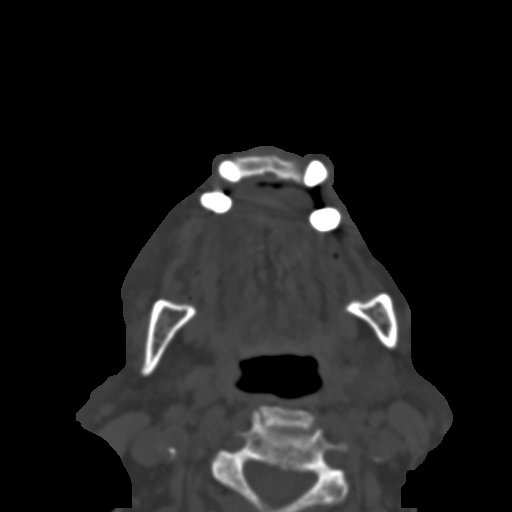
[im 36/81  brain]
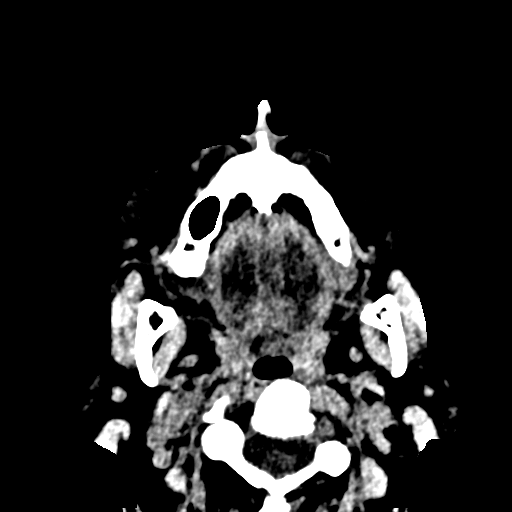
[im 36/81  bone]
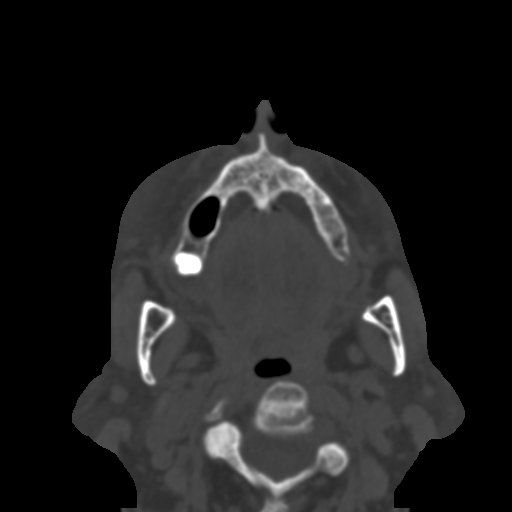
[im 45/81  bone]
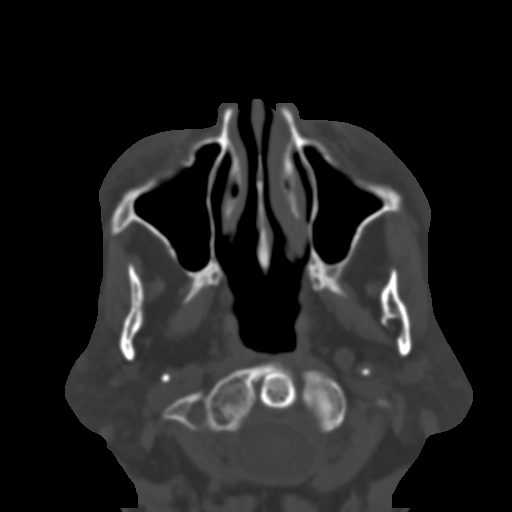
[im 53/81  bone]
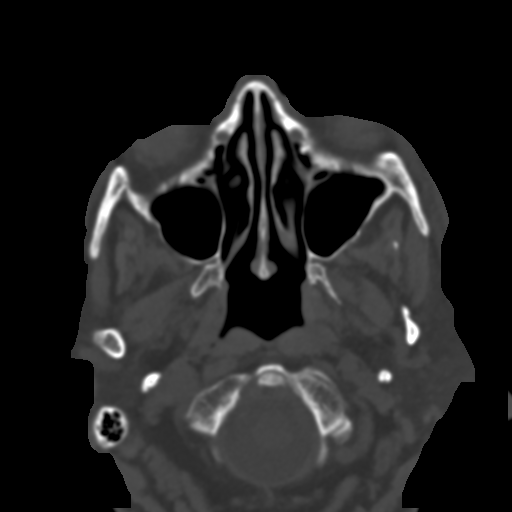
[im 61/81  bone]
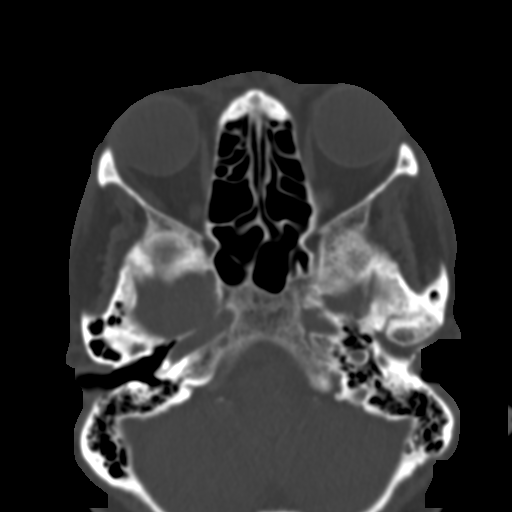
[im 67/81  brain]
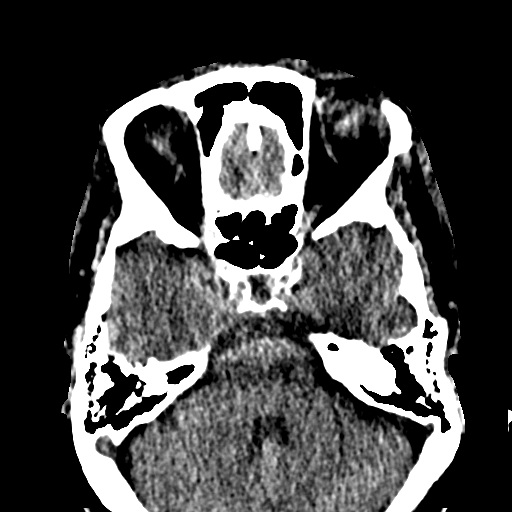
[im 67/81  bone]
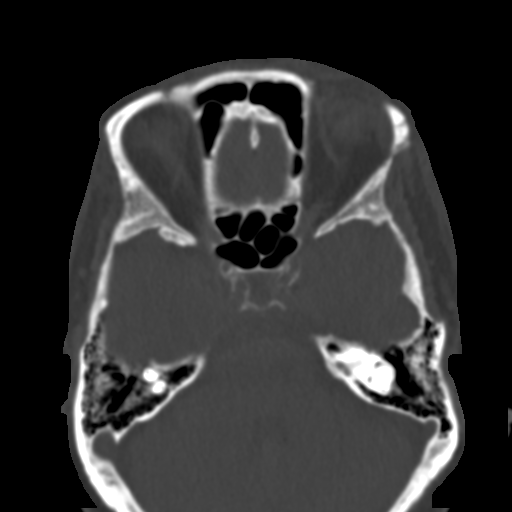
[im 75/81  bone]
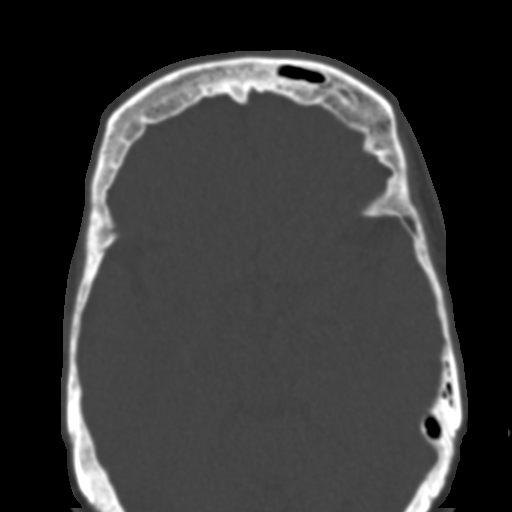

[Series 6: coronal soft · coronal · 0.29mm/px · 3 of 70 slices shown]
[im 24/70  bone]
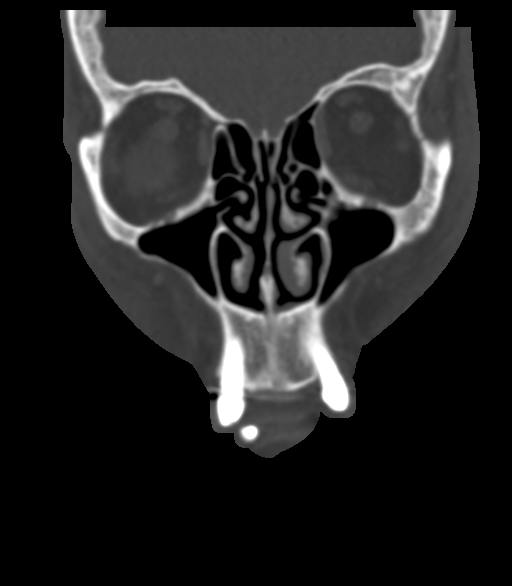
[im 31/70  bone]
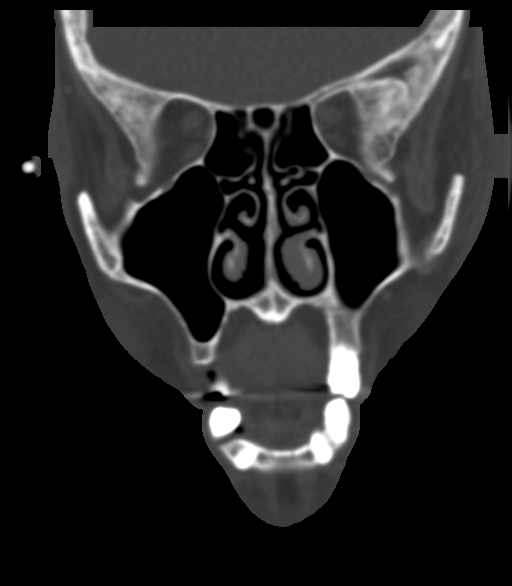
[im 39/70  bone]
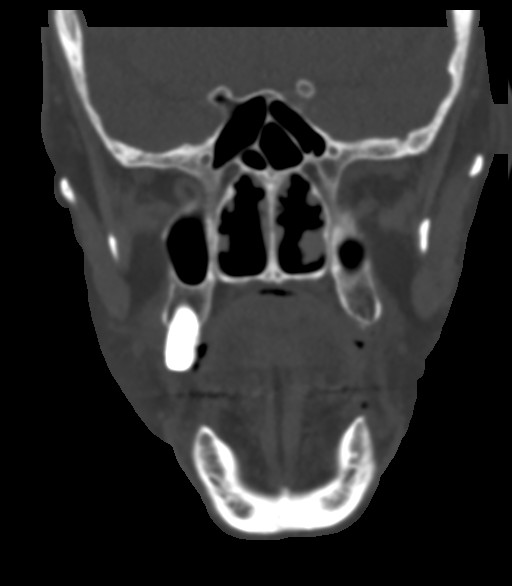

[Series 7: sagittal soft · sagittal · 0.29mm/px · 3 of 75 slices shown]
[im 25/75  bone]
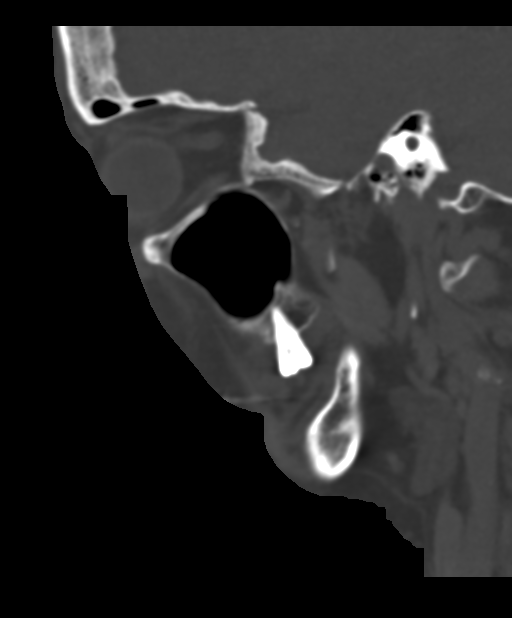
[im 38/75  bone]
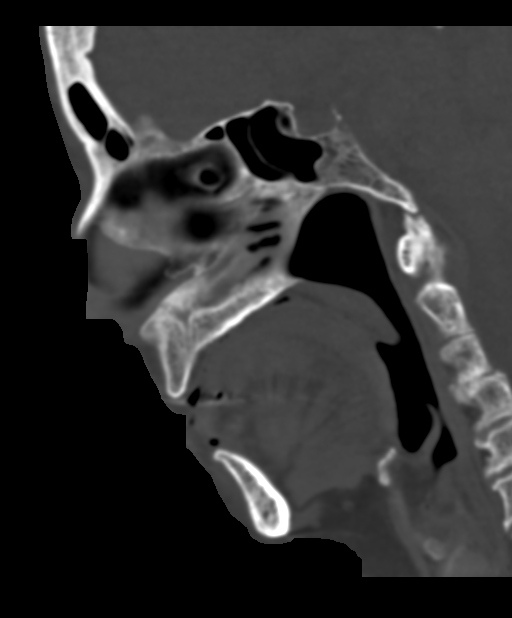
[im 50/75  bone]
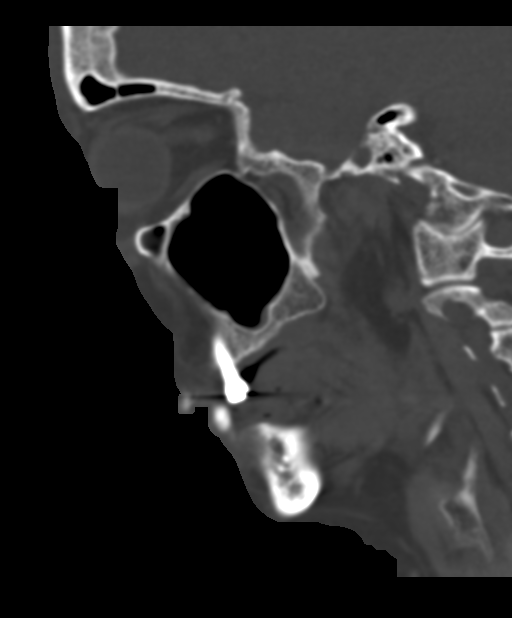

[16 of 47 positions shown; findings below may reference images not displayed]

FINDINGS: Osseous: No acute or focal bony abnormality is identified. The
patient is nearly edentulous. TMJ osteoarthritis on the left is
noted.

Orbits: Status post bilateral lens extraction. Orbital fat is clear.
Extraocular muscles appear normal.

Sinuses: Clear.

Soft tissues: Diffuse soft tissue swelling is present about the
face. No focal fluid collection.

Limited intracranial: Negative.
IMPRESSION: Diffuse soft tissue swelling about the face of unknown etiology. No
focal fluid collection is identified.

## 2016-12-09 MED ORDER — DIPHENHYDRAMINE HCL 50 MG/ML IJ SOLN
25.0000 mg | Freq: Once | INTRAMUSCULAR | Status: AC
  Administered 2016-12-09: 25 mg via INTRAVENOUS
  Filled 2016-12-09: qty 1

## 2016-12-09 MED ORDER — KETOROLAC TROMETHAMINE 30 MG/ML IJ SOLN
15.0000 mg | Freq: Once | INTRAMUSCULAR | Status: AC
  Administered 2016-12-09: 15 mg via INTRAVENOUS
  Filled 2016-12-09: qty 1

## 2016-12-09 NOTE — ED Triage Notes (Signed)
Pt c/o facial swelling since Friday, but states the pain started today. Pt has redness to forehead and c/o headache.

## 2016-12-10 ENCOUNTER — Encounter (HOSPITAL_COMMUNITY): Payer: Self-pay

## 2016-12-10 DIAGNOSIS — L03211 Cellulitis of face: Secondary | ICD-10-CM | POA: Diagnosis present

## 2016-12-10 DIAGNOSIS — L0390 Cellulitis, unspecified: Secondary | ICD-10-CM | POA: Insufficient documentation

## 2016-12-10 DIAGNOSIS — R229 Localized swelling, mass and lump, unspecified: Secondary | ICD-10-CM | POA: Diagnosis not present

## 2016-12-10 DIAGNOSIS — I10 Essential (primary) hypertension: Secondary | ICD-10-CM

## 2016-12-10 LAB — BASIC METABOLIC PANEL
Anion gap: 7 (ref 5–15)
BUN: 11 mg/dL (ref 6–20)
CO2: 24 mmol/L (ref 22–32)
Calcium: 8.2 mg/dL — ABNORMAL LOW (ref 8.9–10.3)
Chloride: 103 mmol/L (ref 101–111)
Creatinine, Ser: 0.89 mg/dL (ref 0.44–1.00)
GFR calc Af Amer: >60 mL/min (ref >60)
GFR calc non Af Amer: 57 mL/min — ABNORMAL LOW (ref >60)
Glucose, Bld: 108 mg/dL — ABNORMAL HIGH (ref 65–99)
Potassium: 3.6 mmol/L (ref 3.5–5.1)
Sodium: 134 mmol/L — ABNORMAL LOW (ref 135–145)

## 2016-12-10 LAB — COMPREHENSIVE METABOLIC PANEL
ALT: 12 U/L — ABNORMAL LOW (ref 14–54)
AST: 15 U/L (ref 15–41)
Albumin: 3.4 g/dL — ABNORMAL LOW (ref 3.5–5.0)
Alkaline Phosphatase: 81 U/L (ref 38–126)
Anion gap: 9 (ref 5–15)
BUN: 11 mg/dL (ref 6–20)
CO2: 23 mmol/L (ref 22–32)
Calcium: 8.8 mg/dL — ABNORMAL LOW (ref 8.9–10.3)
Chloride: 108 mmol/L (ref 101–111)
Creatinine, Ser: 0.84 mg/dL (ref 0.44–1.00)
GFR calc Af Amer: >60 mL/min (ref >60)
GFR calc non Af Amer: >60 mL/min (ref >60)
Glucose, Bld: 111 mg/dL — ABNORMAL HIGH (ref 65–99)
Potassium: 3.7 mmol/L (ref 3.5–5.1)
Sodium: 140 mmol/L (ref 135–145)
Total Bilirubin: 0.6 mg/dL (ref 0.3–1.2)
Total Protein: 6.6 g/dL (ref 6.5–8.1)

## 2016-12-10 LAB — CBC
HCT: 28.6 % — ABNORMAL LOW (ref 36.0–46.0)
Hemoglobin: 9.3 g/dL — ABNORMAL LOW (ref 12.0–15.0)
MCH: 29.5 pg (ref 26.0–34.0)
MCHC: 32.5 g/dL (ref 30.0–36.0)
MCV: 90.8 fL (ref 78.0–100.0)
Platelets: 232 10*3/uL (ref 150–400)
RBC: 3.15 MIL/uL — ABNORMAL LOW (ref 3.87–5.11)
RDW: 13.2 % (ref 11.5–15.5)
WBC: 11.4 10*3/uL — ABNORMAL HIGH (ref 4.0–10.5)

## 2016-12-10 LAB — CBC WITH DIFFERENTIAL/PLATELET
Basophils Absolute: 0 10*3/uL (ref 0.0–0.1)
Basophils Relative: 0 %
Eosinophils Absolute: 0.1 10*3/uL (ref 0.0–0.7)
Eosinophils Relative: 1 %
HCT: 31.2 % — ABNORMAL LOW (ref 36.0–46.0)
Hemoglobin: 10.4 g/dL — ABNORMAL LOW (ref 12.0–15.0)
Lymphocytes Relative: 11 %
Lymphs Abs: 1.3 10*3/uL (ref 0.7–4.0)
MCH: 30 pg (ref 26.0–34.0)
MCHC: 33.3 g/dL (ref 30.0–36.0)
MCV: 89.9 fL (ref 78.0–100.0)
Monocytes Absolute: 1.3 10*3/uL — ABNORMAL HIGH (ref 0.1–1.0)
Monocytes Relative: 11 %
Neutro Abs: 9.1 10*3/uL — ABNORMAL HIGH (ref 1.7–7.7)
Neutrophils Relative %: 77 %
Platelets: 254 10*3/uL (ref 150–400)
RBC: 3.47 MIL/uL — ABNORMAL LOW (ref 3.87–5.11)
RDW: 13.2 % (ref 11.5–15.5)
WBC: 11.7 10*3/uL — ABNORMAL HIGH (ref 4.0–10.5)

## 2016-12-10 MED ORDER — ASPIRIN EC 81 MG PO TBEC
81.0000 mg | DELAYED_RELEASE_TABLET | Freq: Every day | ORAL | Status: DC
  Administered 2016-12-10: 81 mg via ORAL
  Filled 2016-12-10: qty 1

## 2016-12-10 MED ORDER — CITALOPRAM HYDROBROMIDE 20 MG PO TABS
10.0000 mg | ORAL_TABLET | Freq: Every day | ORAL | Status: DC
  Administered 2016-12-10: 10 mg via ORAL
  Filled 2016-12-10: qty 1

## 2016-12-10 MED ORDER — LOSARTAN POTASSIUM 50 MG PO TABS
50.0000 mg | ORAL_TABLET | Freq: Two times a day (BID) | ORAL | Status: DC
  Administered 2016-12-10: 50 mg via ORAL
  Filled 2016-12-10: qty 1

## 2016-12-10 MED ORDER — VANCOMYCIN HCL 500 MG IV SOLR
500.0000 mg | INTRAVENOUS | Status: DC
  Filled 2016-12-10: qty 500

## 2016-12-10 MED ORDER — AMLODIPINE BESYLATE 5 MG PO TABS
5.0000 mg | ORAL_TABLET | Freq: Every day | ORAL | Status: DC
  Administered 2016-12-10: 5 mg via ORAL
  Filled 2016-12-10: qty 1

## 2016-12-10 MED ORDER — ACETAMINOPHEN 650 MG RE SUPP: 650.0000 mg | Freq: Four times a day (QID) | RECTAL | Status: DC | PRN

## 2016-12-10 MED ORDER — SODIUM CHLORIDE 0.9% FLUSH: 3.0000 mL | INTRAVENOUS | Status: DC | PRN

## 2016-12-10 MED ORDER — VANCOMYCIN HCL IN DEXTROSE 750-5 MG/150ML-% IV SOLN
750.0000 mg | Freq: Once | INTRAVENOUS | Status: AC
  Administered 2016-12-10: 750 mg via INTRAVENOUS
  Filled 2016-12-10: qty 150

## 2016-12-10 MED ORDER — ACETAMINOPHEN 325 MG PO TABS
650.0000 mg | ORAL_TABLET | Freq: Four times a day (QID) | ORAL | Status: DC | PRN
Start: 2016-12-10 — End: 2016-12-10
  Administered 2016-12-10: 650 mg via ORAL
  Filled 2016-12-10: qty 2

## 2016-12-10 MED ORDER — PIPERACILLIN-TAZOBACTAM 3.375 G IVPB 30 MIN
3.3750 g | Freq: Once | INTRAVENOUS | Status: AC
  Administered 2016-12-10: 3.375 g via INTRAVENOUS
  Filled 2016-12-10: qty 50

## 2016-12-10 MED ORDER — SODIUM CHLORIDE 0.9 % IV SOLN: 250.0000 mL | INTRAVENOUS | Status: DC | PRN

## 2016-12-10 MED ORDER — VANCOMYCIN HCL IN DEXTROSE 1-5 GM/200ML-% IV SOLN
1000.0000 mg | Freq: Once | INTRAVENOUS | Status: DC
Start: 2016-12-10 — End: 2016-12-10
  Filled 2016-12-10: qty 200

## 2016-12-10 MED ORDER — PIPERACILLIN-TAZOBACTAM 3.375 G IVPB
3.3750 g | Freq: Three times a day (TID) | INTRAVENOUS | Status: DC
  Administered 2016-12-10: 3.375 g via INTRAVENOUS
  Filled 2016-12-10: qty 50

## 2016-12-10 MED ORDER — SODIUM CHLORIDE 0.9% FLUSH
3.0000 mL | Freq: Two times a day (BID) | INTRAVENOUS | Status: DC
  Administered 2016-12-10 (×2): 3 mL via INTRAVENOUS

## 2016-12-10 MED ORDER — DOXYCYCLINE HYCLATE 100 MG PO CAPS: 100.0000 mg | ORAL_CAPSULE | Freq: Two times a day (BID) | ORAL | 0 refills | Status: AC

## 2016-12-10 NOTE — Care Management Obs Status (Signed)
[place] NOTIFICATION   Patient Details  Name: Renee Blake MRN: [id number] Date of Birth: Jul 28, 1928   Medicare Observation Status Notification Given:  Other (see comment) (Pt discharged < 24hrs)    Sherald Barge, RN 12/10/2016, 3:16 PM

## 2016-12-10 NOTE — H&P (Signed)
History and Physical    Renee Blake:[id number] DOB: 05-13-1928 DOA: 12/09/2016  PCP: [name], MD  Patient coming from: home  Chief Complaint:  Face swollen  HPI: Renee Blake is a 83 y.o. female with medical history significant of HTN, skin cancer, comes in with what started off as a sore to her forehead a couple of days ago.  It has gotten redder and redder to the point that it is spread down to her eyes and both eyes are swollen.she has had subjective fevers and chills at home.  No n/v/d.  No recent abx usage.  Ct shows no absess formation.  Referred for admission for facial cellulitis. Pt reports that the swelling in her eyes has already much improved after getting iv abx in ED several hours ago.   Review of Systems: As per HPI otherwise 10 point review of systems negative.   Past Medical History:  Diagnosis Date  . Acute blood loss anemia   . Chronic diarrhea   . Dilation of biliary tract    biliary duct per 02/26/13 CT  . Duodenal stricture   . Esophageal stricture   . Gastric ulcer   . GI bleed   . Hyperlipemia   . Hypertension   . Microscopic colitis   . Nephrolithiasis   . Pancreatic duct dilated 02/26/13  . Schatzki's ring   . Skin cancer   . Sliding hiatal hernia   . Vertigo     Past Surgical History:  Procedure Laterality Date  . ESOPHAGOGASTRODUODENOSCOPY N/A 02/27/2013   Procedure: ESOPHAGOGASTRODUODENOSCOPY (EGD);  Surgeon: Ladene Artist, MD;  Location: [facility] ENDOSCOPY;  Service: Endoscopy;  Laterality: N/A;  . ESOPHAGOGASTRODUODENOSCOPY N/A 03/28/2013   Procedure: ESOPHAGOGASTRODUODENOSCOPY (EGD);  Surgeon: Rogene Houston, MD;  Location: [facility] ENDO SUITE;  Service: Endoscopy;  Laterality: N/A;  . HOT HEMOSTASIS N/A 02/27/2013   Procedure: HOT HEMOSTASIS (ARGON PLASMA COAGULATION/BICAP);  Surgeon: Ladene Artist, MD;  Location: [facility] ENDOSCOPY;  Service: Endoscopy;  Laterality: N/A;  . NOSE SURGERY     for skin cancer     reports that she has  never smoked. She has never used smokeless tobacco. She reports that she does not drink alcohol or use drugs.  Allergies  Allergen Reactions  . Codeine Nausea And Vomiting and Other (See Comments)    Reaction unknown  . Sulfa Antibiotics Other (See Comments)    Reactions unknown    Family History  Problem Relation Age of Onset  . Heart attack Father     Prior to Admission medications   Medication Sig Start Date End Date Taking? Authorizing Provider  ALPRAZolam (XANAX) 0.25 MG tablet Take 0.25 mg by mouth 2 (two) times daily as needed. for anxiety 02/17/15   [provider]  amLODipine (NORVASC) 5 MG tablet Take 1 tablet (5 mg total) by mouth daily. 02/25/15   [name], MD  aspirin EC 81 MG tablet Take 81 mg by mouth daily.    [provider]  bisacodyl (DULCOLAX) 5 MG EC tablet Take 5 mg by mouth daily as needed for moderate constipation.    [provider]  cholestyramine (QUESTRAN) 4 g packet MIX AND TAKE ONE PACKET BY MOUTH AS DIRECTED DAILY. 08/23/16   [provider]  cholestyramine Lucrezia Starch) 4 GM/DOSE powder Take 4 g by mouth daily as needed (for constipation).  01/12/14   [provider]  citalopram (CELEXA) 10 MG tablet Take 10 mg by mouth daily. 02/17/15   [provider]  furosemide (LASIX) 20 MG tablet Take 20 mg by mouth daily. 03/08/14   [provider]  loperamide (IMODIUM) 2 MG capsule Take 2 mg by mouth as needed for diarrhea or loose stools.  02/26/14   [provider]  losartan (COZAAR) 50 MG tablet Take 50 mg by mouth 2 (two) times daily.     [provider]  meclizine (ANTIVERT) 25 MG tablet Take 25 mg by mouth 3 (three) times daily as needed for dizziness.  03/15/14   [provider]  naphazoline-glycerin (CLEAR EYES) 0.012-0.2 % SOLN Place 1-2 drops into both eyes 4 (four) times daily as needed for irritation.    [provider]  ondansetron (ZOFRAN) 4 MG tablet Take  4 mg by mouth every 8 (eight) hours as needed for nausea or vomiting.    [provider]  pantoprazole (PROTONIX) 40 MG tablet Take 40 mg by mouth every morning.     [provider]  Pediatric Multiple Vit-C-FA (FLINSTONES GUMMIES OMEGA-3 DHA PO) Take by mouth.    [provider]  potassium chloride (K-DUR,KLOR-CON) 10 MEQ tablet Take 2 tablets (20 mEq total) by mouth 2 (two) times daily. 02/25/15   [name], MD  traMADol (ULTRAM) 50 MG tablet Take 1 tablet (50 mg total) by mouth every 6 (six) hours as needed. Patient not taking: Reported on 11/13/2016 11/16/14   Deno Etienne, DO    Physical Exam: Vitals:   12/09/16 2238 12/10/16 0030 12/10/16 0104 12/10/16 0130  BP: (!) 163/76 (!) 141/59 135/65 (!) 131/59  Pulse: 94 78 76 73  Resp: 17 17 17 17   Temp: 98.8 F (37.1 C)     TempSrc: Oral     SpO2: 96% 98% 96% 95%  Weight: 53.5 kg (118 lb)     Height: 5\' 6"  (1.676 m)         Constitutional: NAD, calm, comfortable Vitals:   12/09/16 2238 12/10/16 0030 12/10/16 0104 12/10/16 0130  BP: (!) 163/76 (!) 141/59 135/65 (!) 131/59  Pulse: 94 78 76 73  Resp: 17 17 17 17   Temp: 98.8 F (37.1 C)     TempSrc: Oral     SpO2: 96% 98% 96% 95%  Weight: 53.5 kg (118 lb)     Height: 5\' 6"  (1.676 m)      Eyes: PERRL, lids swollen and red, conjunctiva no exudate no red ENMT: Mucous membranes are moist. Posterior pharynx clear of any exudate or lesions.Normal dentition.  Neck: normal, supple, no masses, no thyromegaly Respiratory: clear to auscultation bilaterally, no wheezing, no crackles. Normal respiratory effort. No accessory muscle use.  Cardiovascular: Regular rate and rhythm, no murmurs / rubs / gallops. No extremity edema. 2+ pedal pulses. No carotid bruits.  Abdomen: no tenderness, no masses palpated. No hepatosplenomegaly. Bowel sounds positive.  Musculoskeletal: no clubbing / cyanosis. No joint deformity upper and lower extremities. Good ROM, no contractures.  Normal muscle tone.  Skin: erythematous rash to forehead down to below bitaleral eyes c/w cellulitis Neurologic: CN 2-12 grossly intact. Sensation intact, DTR normal. Strength 5/5 in all 4.  Psychiatric: Normal judgment and insight. Alert and oriented x 3. Normal mood.    Labs on Admission: I have personally reviewed following labs and imaging studies  CBC:  Recent Labs Lab 12/09/16 2355  WBC 11.7*  NEUTROABS 9.1*  HGB 10.4*  HCT 31.2*  MCV 89.9  PLT 009   Basic Metabolic Panel:  Recent Labs Lab 12/09/16 2355  NA 140  K  3.7  CL 108  CO2 23  GLUCOSE 111*  BUN 11  CREATININE 0.84  CALCIUM 8.8*   GFR: Estimated Creatinine Clearance: 40.6 mL/min (by C-G formula based on SCr of 0.84 mg/dL). Liver Function Tests:  Recent Labs Lab 12/09/16 2355  AST 15  ALT 12*  ALKPHOS 81  BILITOT 0.6  PROT 6.6  ALBUMIN 3.4*    Radiological Exams on Admission: Ct Maxillofacial Wo Cm  Result Date: 12/10/2016 CLINICAL DATA:  Facial swelling and redness since 12/06/2016. Headache. EXAM: CT MAXILLOFACIAL WITHOUT CONTRAST TECHNIQUE: Multidetector CT imaging of the maxillofacial structures was performed. Multiplanar CT image reconstructions were also generated. COMPARISON:  None. FINDINGS: Osseous: No acute or focal bony abnormality is identified. The patient is nearly edentulous. 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 Orbits: Status post bilateral lens extraction. Orbital fat is clear. Extraocular muscles appear normal. Sinuses: Clear. Soft tissues: Diffuse soft tissue swelling is present about the face. No focal fluid collection. Limited intracranial: Negative. IMPRESSION: Diffuse soft tissue swelling about the face of unknown etiology. No focal fluid collection is identified. Electronically Signed   By: Inge Rise M.D.   On: 12/10/2016 00:40    Assessment/Plan 83 yo female with facial cellulitis worsening over last 48 hours  Principal Problem:   Facial cellulitis- provide  broad abx vanc/zosyn for first 24 to 48 hours until improved.  Already seems better per pt report.  Per dr Tomi Bamberger photo in her note her eyes do seem a lot less swollen already.  Active Problems:   Hypertension- stable cont home meds   Diabetes mellitus ([place])- ssi for in house   Med rec pending   DVT prophylaxis: scds  Code Status:  full Family Communication: none  Disposition Plan:  Per day team Consults called:  none Admission status:  observation   Ronee Ranganathan A MD [facility]  If 7PM-7AM, please contact night-coverage [email or web address] Password [email or web address]  12/10/2016, 2:15 AM

## 2016-12-10 NOTE — ED Notes (Signed)
Patient transported to CT 

## 2016-12-10 NOTE — Progress Notes (Signed)
Renee Blake discharged Home per MD order.  Discharge instructions reviewed and discussed with the patient, all questions and concerns answered. Copy of instructions and scripts given to patient.  Allergies as of 12/10/2016      Reactions   Codeine Nausea And Vomiting, Other (See Comments)   Reaction unknown   Sulfa Antibiotics Other (See Comments)   Reactions unknown      Medication List    TAKE these medications   ALPRAZolam 0.25 MG tablet Commonly known as:  XANAX Take 0.25 mg by mouth 2 (two) times daily as needed. for anxiety   amLODipine 5 MG tablet Commonly known as:  NORVASC Take 1 tablet (5 mg total) by mouth daily.   aspirin EC 81 MG tablet Take 81 mg by mouth daily.   bisacodyl 5 MG EC tablet Commonly known as:  DULCOLAX Take 5 mg by mouth daily as needed for moderate constipation.   cholestyramine 4 g packet Commonly known as:  QUESTRAN MIX AND TAKE ONE PACKET BY MOUTH AS DIRECTED DAILY.   doxycycline 100 MG capsule Commonly known as:  VIBRAMYCIN Take 1 capsule (100 mg total) by mouth 2 (two) times daily.   FLINSTONES GUMMIES OMEGA-3 DHA PO Take by mouth.   furosemide 20 MG tablet Commonly known as:  LASIX Take 20 mg by mouth daily.   loperamide 2 MG capsule Commonly known as:  IMODIUM Take 2 mg by mouth as needed for diarrhea or loose stools.   losartan 50 MG tablet Commonly known as:  COZAAR Take 50 mg by mouth 2 (two) times daily.   meclizine 25 MG tablet Commonly known as:  ANTIVERT Take 25 mg by mouth 3 (three) times daily as needed for dizziness.   naphazoline-glycerin 0.012-0.2 % Soln Commonly known as:  CLEAR EYES Place 1-2 drops into both eyes 4 (four) times daily as needed for irritation.   pantoprazole 40 MG tablet Commonly known as:  PROTONIX Take 40 mg by mouth every morning.   potassium chloride 10 MEQ tablet Commonly known as:  K-DUR,KLOR-CON Take 2 tablets (20 mEq total) by mouth 2 (two) times daily.             Discharge Care Instructions        Start     Ordered   12/10/16 0000  doxycycline (VIBRAMYCIN) 100 MG capsule  2 times daily     12/10/16 1444   12/10/16 0000  Increase activity slowly     12/10/16 1444   12/10/16 0000  Diet - low sodium heart healthy     12/10/16 1444       IV site discontinued and catheter remains intact. Site without signs and symptoms of complications. Dressing and pressure applied.  Patient escorted to car in a wheelchair,  no distress noted upon discharge.  Renee Blake 12/10/2016 5:16 PM

## 2016-12-10 NOTE — ED Provider Notes (Addendum)
[facility] DEPT Provider Note   CSN: [id number] Arrival date & time: 12/09/16  2232  Time seen 23:15 PM   History   Chief Complaint Chief Complaint  Patient presents with  . Facial Swelling    HPI Renee Blake is a 83 y.o. female.  HPI  patient states September 7 she had a bump on her left forehead that was burning and stinging and had some mild itching. She states as the days have progressed she started having swelling and redness of her forehead and today started getting swelling and redness around both her eyes. She states her eyes have been tearing. She complains of a headache on top of her head where her scalp is still red. She states it's a pressure feeling and she denies throbbing. Nothing makes the headache feel worse. She denies any fever. She denies prior history of shingles but does have a history of psoriasis her daughter states on September 7 a picked up her blood pressure medication, losartan. They state the patient had been on a white pill that was 10 mg and this prescription was a green pill that was 50 mg. She took it twice on that day. And then stop. She complains of being "swimmy headed" when she stands. She has had nausea without vomiting tonight. She denies chills, numbness or tingling of her extremities. She denies any swelling in her mouth or on her lips. She denies any difficulty swallowing or breathing. Patient is noted to have some deformity of her nose and states she had some type of skin cancer removed over 20 years ago. She does not know what kind. She states skin cancer does run in her family.  PCP [name], MD   Past Medical History:  Diagnosis Date  . Acute blood loss anemia   . Chronic diarrhea   . Dilation of biliary tract    biliary duct per 02/26/13 CT  . Duodenal stricture   . Esophageal stricture   . Gastric ulcer   . GI bleed   . Hyperlipemia   . Hypertension   . Microscopic colitis   . Nephrolithiasis   . Pancreatic duct dilated  02/26/13  . Schatzki's ring   . Skin cancer   . Sliding hiatal hernia   . Vertigo     Patient Active Problem List   Diagnosis Date Noted  . Cellulitis 12/10/2016  . Erroneous encounter - disregard 03/17/2015  . AKI (acute kidney injury) ([place])   . Metabolic acidosis 46/96/2952  . Acute renal failure syndrome ([place])   . Renal failure 08/22/2014  . Hyperkalemia 08/22/2014  . UTI (urinary tract infection) 08/22/2014  . Acute renal failure ([place]) 08/22/2014  . Dyspnea 03/15/2014  . Diabetes mellitus ([place]) 06/08/2013  . Acute diastolic congestive heart failure ([place]) 06/07/2013  . Syncope 03/26/2013  . Rectal bleeding 03/26/2013  . Dehydration 03/26/2013  . Generalized weakness 03/26/2013  . Malnutrition of moderate degree ([place]) 03/01/2013  . Diarrhea 02/28/2013  . Loss of weight 02/28/2013  . Acute duodenal ulcer with hemorrhage, without mention of obstruction 02/27/2013  . GIB (gastrointestinal bleeding) 02/26/2013  . Acute blood loss anemia 02/26/2013  . UTI (lower urinary tract infection) 02/26/2013  . Hypertension 02/26/2013    Past Surgical History:  Procedure Laterality Date  . ESOPHAGOGASTRODUODENOSCOPY N/A 02/27/2013   Procedure: ESOPHAGOGASTRODUODENOSCOPY (EGD);  Surgeon: Ladene Artist, MD;  Location: [facility] ENDOSCOPY;  Service: Endoscopy;  Laterality: N/A;  . ESOPHAGOGASTRODUODENOSCOPY N/A 03/28/2013   Procedure: ESOPHAGOGASTRODUODENOSCOPY (EGD);  Surgeon: Bernadene Person  Gloriann Loan, MD;  Location: [facility] ENDO SUITE;  Service: Endoscopy;  Laterality: N/A;  . HOT HEMOSTASIS N/A 02/27/2013   Procedure: HOT HEMOSTASIS (ARGON PLASMA COAGULATION/BICAP);  Surgeon: Ladene Artist, MD;  Location: [facility] ENDOSCOPY;  Service: Endoscopy;  Laterality: N/A;  . NOSE SURGERY     for skin cancer    OB History    Gravida Para Term Preterm AB Living   6 6 6     6    SAB TAB Ectopic Multiple Live Births                   Home Medications    Prior to Admission medications   Medication Sig Start  Date End Date Taking? Authorizing Provider  ALPRAZolam (XANAX) 0.25 MG tablet Take 0.25 mg by mouth 2 (two) times daily as needed. for anxiety 02/17/15   [provider]  amLODipine (NORVASC) 5 MG tablet Take 1 tablet (5 mg total) by mouth daily. 02/25/15   [name], MD  aspirin EC 81 MG tablet Take 81 mg by mouth daily.    [provider]  bisacodyl (DULCOLAX) 5 MG EC tablet Take 5 mg by mouth daily as needed for moderate constipation.    [provider]  cholestyramine (QUESTRAN) 4 g packet MIX AND TAKE ONE PACKET BY MOUTH AS DIRECTED DAILY. 08/23/16   [provider]  cholestyramine Lucrezia Starch) 4 GM/DOSE powder Take 4 g by mouth daily as needed (for constipation).  01/12/14   [provider]  citalopram (CELEXA) 10 MG tablet Take 10 mg by mouth daily. 02/17/15   [provider]  furosemide (LASIX) 20 MG tablet Take 20 mg by mouth daily. 03/08/14   [provider]  loperamide (IMODIUM) 2 MG capsule Take 2 mg by mouth as needed for diarrhea or loose stools.  02/26/14   [provider]  losartan (COZAAR) 50 MG tablet Take 50 mg by mouth 2 (two) times daily.     [provider]  meclizine (ANTIVERT) 25 MG tablet Take 25 mg by mouth 3 (three) times daily as needed for dizziness.  03/15/14   [provider]  naphazoline-glycerin (CLEAR EYES) 0.012-0.2 % SOLN Place 1-2 drops into both eyes 4 (four) times daily as needed for irritation.    [provider]  ondansetron (ZOFRAN) 4 MG tablet Take 4 mg by mouth every 8 (eight) hours as needed for nausea or vomiting.    [provider]  pantoprazole (PROTONIX) 40 MG tablet Take 40 mg by mouth every morning.     [provider]  Pediatric Multiple Vit-C-FA (FLINSTONES GUMMIES OMEGA-3 DHA PO) Take by mouth.    [provider]  potassium chloride (K-DUR,KLOR-CON) 10 MEQ tablet Take 2 tablets (20 mEq total) by mouth 2 (two) times daily.  02/25/15   [name], MD  traMADol (ULTRAM) 50 MG tablet Take 1 tablet (50 mg total) by mouth every 6 (six) hours as needed. Patient not taking: Reported on 11/13/2016 11/16/14   Deno Etienne, DO    Family History Family History  Problem Relation Age of Onset  . Heart attack Father     Social History Social History  Substance Use Topics  . Smoking status: Never Smoker  . Smokeless tobacco: Never Used  . Alcohol use No  lives at home  Lives with daughter   Allergies   Codeine and Sulfa antibiotics   Review of Systems Review of Systems  All other systems reviewed and are negative.  Physical Exam Updated Vital Signs BP (!) 163/76 (BP Location: Left Arm)   Pulse 94   Temp 98.8 F (37.1 C) (Oral)   Resp 17   Ht 5\' 6"  (1.676 m)   Wt 53.5 kg (118 lb)   SpO2 96%   BMI 19.05 kg/m   Vital signs normal except for hypertension  Orthostatic VS for the past 24 hrs:  BP- Lying Pulse- Lying BP- Sitting Pulse- Sitting BP- Standing at 0 minutes Pulse- Standing at 0 minutes  12/10/16 0032 142/63 76 146/69 78 152/71 77  12/10/16 0029 141/59 76 143/63 78 - -    Orthostatic Vital signs normal except for hypertension     Physical Exam  Constitutional: She is oriented to person, place, and time.  Non-toxic appearance. She does not appear ill. No distress.  Thin elderly female  HENT:  Head: Normocephalic.  Right Ear: External ear normal.  Left Ear: External ear normal.  Nose: Nose normal. No mucosal edema or rhinorrhea.  Mouth/Throat: Oropharynx is clear and moist and mucous membranes are normal. No dental abscesses or uvula swelling.  Patient has diffuse redness of the top of her scalp and extending into for her forehead where she has to scabbed lesions just to the left of midline. She also has some other irregularity of the skin with a couple areas of hypopigmented lesions and some areas of some increased scaliness and redness.  Eyes: Pupils are equal, round, and reactive  to light. Conjunctivae and EOM are normal.  Patient has diffuse swelling of her upper and lower eyelids bilaterally, the left worse than the right.   Neck: Normal range of motion and full passive range of motion without pain. Neck supple.  Cardiovascular: Normal rate and regular rhythm.  Exam reveals no gallop and no friction rub.   Murmur heard.  Systolic murmur is present  Harsh high pitched murmer ? AS heard in the LUSB and RUSB  Pulmonary/Chest: Effort normal and breath sounds normal. No respiratory distress. She has no wheezes. She has no rhonchi. She has no rales. She exhibits no tenderness and no crepitus.  Abdominal: Soft. Normal appearance and bowel sounds are normal. She exhibits no distension. There is no tenderness. There is no rebound and no guarding.  Musculoskeletal: Normal range of motion. She exhibits no edema or tenderness.  Moves all extremities well.   Neurological: She is alert and oriented to person, place, and time. She has normal strength. No cranial nerve deficit.  Skin: Skin is warm, dry and intact. No rash noted. No erythema. No pallor.  Psychiatric: She has a normal mood and affect. Her speech is normal and behavior is normal. Her mood appears not anxious.  Nursing note and vitals reviewed.        ED Treatments / Results  Labs (all labs ordered are listed, but only abnormal results are displayed) Results for orders placed or performed during the hospital encounter of 12/09/16  Comprehensive metabolic panel  Result Value Ref Range   Sodium 140 135 - 145 mmol/L   Potassium 3.7 3.5 - 5.1 mmol/L   Chloride 108 101 - 111 mmol/L   CO2 23 22 - 32 mmol/L   Glucose, Bld 111 (H) 65 - 99 mg/dL   BUN 11 6 - 20 mg/dL   Creatinine, Ser 0.84 0.44 - 1.00 mg/dL   Calcium 8.8 (L) 8.9 - 10.3 mg/dL   Total Protein 6.6 6.5 - 8.1 g/dL   Albumin 3.4 (L) 3.5 - 5.0 g/dL  AST 15 15 - 41 U/L   ALT 12 (L) 14 - 54 U/L   Alkaline Phosphatase 81 38 - 126 U/L   Total Bilirubin  0.6 0.3 - 1.2 mg/dL   GFR calc non Af Amer >60 >60 mL/min   GFR calc Af Amer >60 >60 mL/min   Anion gap 9 5 - 15  CBC with Differential  Result Value Ref Range   WBC 11.7 (H) 4.0 - 10.5 K/uL   RBC 3.47 (L) 3.87 - 5.11 MIL/uL   Hemoglobin 10.4 (L) 12.0 - 15.0 g/dL   HCT 31.2 (L) 36.0 - 46.0 %   MCV 89.9 78.0 - 100.0 fL   MCH 30.0 26.0 - 34.0 pg   MCHC 33.3 30.0 - 36.0 g/dL   RDW 13.2 11.5 - 15.5 %   Platelets 254 150 - 400 K/uL   Neutrophils Relative % 77 %   Neutro Abs 9.1 (H) 1.7 - 7.7 K/uL   Lymphocytes Relative 11 %   Lymphs Abs 1.3 0.7 - 4.0 K/uL   Monocytes Relative 11 %   Monocytes Absolute 1.3 (H) 0.1 - 1.0 K/uL   Eosinophils Relative 1 %   Eosinophils Absolute 0.1 0.0 - 0.7 K/uL   Basophils Relative 0 %   Basophils Absolute 0.0 0.0 - 0.1 K/uL   Laboratory interpretation all normal except leukocytosis, anemia    EKG  EKG Interpretation None       Radiology Ct Maxillofacial Wo Cm  Result Date: 12/10/2016 CLINICAL DATA:  Facial swelling and redness since 12/06/2016. Headache. EXAM: CT MAXILLOFACIAL WITHOUT CONTRAST TECHNIQUE: Multidetector CT imaging of the maxillofacial structures was performed. Multiplanar CT image reconstructions were also generated. COMPARISON:  None. FINDINGS: Osseous: No acute or focal bony abnormality is identified. The patient is nearly edentulous. 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 Orbits: Status post bilateral lens extraction. Orbital fat is clear. Extraocular muscles appear normal. Sinuses: Clear. Soft tissues: Diffuse soft tissue swelling is present about the face. No focal fluid collection. Limited intracranial: Negative. IMPRESSION: Diffuse soft tissue swelling about the face of unknown etiology. No focal fluid collection is identified. Electronically Signed   By: Inge Rise M.D.   On: 12/10/2016 00:40    Procedures Procedures (including critical care time)  Medications Ordered in ED Medications  piperacillin-tazobactam  (ZOSYN) IVPB 3.375 g (not administered)  vancomycin (VANCOCIN) IVPB 1000 mg/200 mL premix (not administered)  diphenhydrAMINE (BENADRYL) injection 25 mg (25 mg Intravenous Given 12/09/16 2358)  ketorolac (TORADOL) 30 MG/ML injection 15 mg (15 mg Intravenous Given 12/09/16 2358)     Initial Impression / Assessment and Plan / ED Course  I have reviewed the triage vital signs and the nursing notes.  Pertinent labs & imaging results that were available during my care of the patient were reviewed by me and considered in my medical decision making (see chart for details).    Patient was started on sepsis protocol without activating could sepsis. At first I thought she might have shingles however when I look at the lesions they do not look consistent with shingles. The granddaughter thought patient had had some skin cancer on her forehead but the patient denies that. I suspect she may have cellulitis of her forehead and up in her scalp and possibly some involvement of her eyelids or the swelling around her eyelids could be secondary to the cellulitis of the forehead. CT scan of maxillofacial was done to look up this closer. Laboratory testing was ordered.  Patient was started  on cellulitis antibiotics, Zosyn and vancomycin after reviewing her CT scan results..  12:50 AM I discussed her test results with patient and her daughter. She is agreeable for admission for treatment of her cellulitis.  01:05 AM Dr Shanon Brow, hospitalist, will admit  Final Clinical Impressions(s) / ED Diagnoses   Final diagnoses:  Facial cellulitis   Plan admission  Rolland Porter, MD, Barbette Or, MD 12/10/16 3729    Rolland Porter, MD 12/10/16 0127

## 2016-12-10 NOTE — Progress Notes (Signed)
Pharmacy Antibiotic Note  Renee Blake is a 83 y.o. female admitted on 12/09/2016 with cellulitis.  Pharmacy has been consulted for Vancomycin and Zosyn dosing.  Plan: Vancomycin 750mg  IV x 1 Vancomycin 500 mg IV every 24 hours.  Goal trough 15-20 mcg/mL. Zosyn 3.375g IV q8h (4 hour infusion).  Monitor labs, micro and vitals.   Height: 5\' 6"  (167.6 cm) Weight: 119 lb 7 oz (54.2 kg) IBW/kg (Calculated) : 59.3  Temp (24hrs), Avg:98.7 F (37.1 C), Min:98.5 F (36.9 C), Max:98.9 F (37.2 C)   Recent Labs Lab 12/09/16 2355 12/10/16 0458  WBC 11.7* 11.4*  CREATININE 0.84 0.89    Estimated Creatinine Clearance: 38.8 mL/min (by C-G formula based on SCr of 0.89 mg/dL).    Allergies  Allergen Reactions  . Codeine Nausea And Vomiting and Other (See Comments)    Reaction unknown  . Sulfa Antibiotics Other (See Comments)    Reactions unknown   Antimicrobials this admission:  Vanc 9/11 >>  Zosyn 9/11 >>   Dose adjustments this admission:  n/a   Microbiology results:   BCx:   UCx:    Sputum:    MRSA PCR:    Thank you for allowing pharmacy to be a part of this patient's care.  Pricilla Larsson 12/10/2016 8:05 AM

## 2016-12-10 NOTE — Discharge Summary (Signed)
Physician Discharge Summary  Renee Blake:[id number] DOB: 1928/04/18 DOA: 12/09/2016  PCP: [name], MD  Admit date: 12/09/2016 Discharge date: 12/10/2016  Time spent: 45 minutes  Recommendations for Outpatient Follow-up:  -Will be discharged home today. -Advised to take doxycycline for 10 days for treatment of facial cellulitis and to f/u with PCP after treatment is complete.   Discharge Diagnoses:  Principal Problem:   Facial cellulitis Active Problems:   Hypertension   Diabetes mellitus ([place])   Discharge Condition: Stable and improved  Filed Weights   12/09/16 2238 12/10/16 0222  Weight: 53.5 kg (118 lb) 54.2 kg (119 lb 7 oz)    History of present illness:  As per Dr. Shanon Brow on 9/11:  Renee Blake is a 83 y.o. female with medical history significant of HTN, skin cancer, comes in with what started off as a sore to her forehead a couple of days ago.  It has gotten redder and redder to the point that it is spread down to her eyes and both eyes are swollen.she has had subjective fevers and chills at home.  No n/v/d.  No recent abx usage.  Ct shows no absess formation.  Referred for admission for facial cellulitis. Pt reports that the swelling in her eyes has already much improved after getting iv abx in ED several hours ago.  Hospital Course:   Facial Cellulitis -Appears to be a superinfection from an excoriated sore on her forehead that she has been scratching. -Has had marked improvement since admission on abx. See pics below:  On admission:   On discharge:    -Will DC home with a 10 dy course of doxycycline.  Rest of chronic conditions have been stable.   Procedures:  None   Consultations:  None  Discharge Instructions  Discharge Instructions    Diet - low sodium heart healthy    Complete by:  As directed    Increase activity slowly    Complete by:  As directed      Allergies as of 12/10/2016      Reactions   Codeine Nausea And  Vomiting, Other (See Comments)   Reaction unknown   Sulfa Antibiotics Other (See Comments)   Reactions unknown      Medication List    TAKE these medications   ALPRAZolam 0.25 MG tablet Commonly known as:  XANAX Take 0.25 mg by mouth 2 (two) times daily as needed. for anxiety   amLODipine 5 MG tablet Commonly known as:  NORVASC Take 1 tablet (5 mg total) by mouth daily.   aspirin EC 81 MG tablet Take 81 mg by mouth daily.   bisacodyl 5 MG EC tablet Commonly known as:  DULCOLAX Take 5 mg by mouth daily as needed for moderate constipation.   cholestyramine 4 g packet Commonly known as:  QUESTRAN MIX AND TAKE ONE PACKET BY MOUTH AS DIRECTED DAILY.   doxycycline 100 MG capsule Commonly known as:  VIBRAMYCIN Take 1 capsule (100 mg total) by mouth 2 (two) times daily.   FLINSTONES GUMMIES OMEGA-3 DHA PO Take by mouth.   furosemide 20 MG tablet Commonly known as:  LASIX Take 20 mg by mouth daily.   loperamide 2 MG capsule Commonly known as:  IMODIUM Take 2 mg by mouth as needed for diarrhea or loose stools.   losartan 50 MG tablet Commonly known as:  COZAAR Take 50 mg by mouth 2 (two) times daily.   meclizine 25 MG tablet Commonly known as:  ANTIVERT Take 25 mg by mouth 3 (three) times daily as needed for dizziness.   naphazoline-glycerin 0.012-0.2 % Soln Commonly known as:  CLEAR EYES Place 1-2 drops into both eyes 4 (four) times daily as needed for irritation.   pantoprazole 40 MG tablet Commonly known as:  PROTONIX Take 40 mg by mouth every morning.   potassium chloride 10 MEQ tablet Commonly known as:  K-DUR,KLOR-CON Take 2 tablets (20 mEq total) by mouth 2 (two) times daily.            Discharge Care Instructions        Start     Ordered   12/10/16 0000  doxycycline (VIBRAMYCIN) 100 MG capsule  2 times daily     12/10/16 1444   12/10/16 0000  Increase activity slowly     12/10/16 1444   12/10/16 0000  Diet - low sodium heart healthy      12/10/16 1444     Allergies  Allergen Reactions  . Codeine Nausea And Vomiting and Other (See Comments)    Reaction unknown  . Sulfa Antibiotics Other (See Comments)    Reactions unknown   Follow-up Information    [name], MD. Schedule an appointment as soon as possible for a visit in 2 week(s).   Specialty:  Internal Medicine Contact information: [place] [address] [phone]            The results of significant diagnostics from this hospitalization (including imaging, microbiology, ancillary and laboratory) are listed below for reference.    Significant Diagnostic Studies: Ct Maxillofacial Wo Cm  Result Date: 12/10/2016 CLINICAL DATA:  Facial swelling and redness since 12/06/2016. Headache. EXAM: CT MAXILLOFACIAL WITHOUT CONTRAST TECHNIQUE: Multidetector CT imaging of the maxillofacial structures was performed. Multiplanar CT image reconstructions were also generated. COMPARISON:  None. FINDINGS: Osseous: No acute or focal bony abnormality is identified. The patient is nearly edentulous. 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 Orbits: Status post bilateral lens extraction. Orbital fat is clear. Extraocular muscles appear normal. Sinuses: Clear. Soft tissues: Diffuse soft tissue swelling is present about the face. No focal fluid collection. Limited intracranial: Negative. IMPRESSION: Diffuse soft tissue swelling about the face of unknown etiology. No focal fluid collection is identified. Electronically Signed   By: Inge Rise M.D.   On: 12/10/2016 00:40    Microbiology: No results found for this or any previous visit (from the past 240 hour(s)).   Labs: Basic Metabolic Panel:  Recent Labs Lab 12/09/16 2355 12/10/16 0458  NA 140 134*  K 3.7 3.6  CL 108 103  CO2 23 24  GLUCOSE 111* 108*  BUN 11 11  CREATININE 0.84 0.89  CALCIUM 8.8* 8.2*   Liver Function Tests:  Recent Labs Lab 12/09/16 2355  AST 15  ALT 12*  ALKPHOS 81    BILITOT 0.6  PROT 6.6  ALBUMIN 3.4*   No results for input(s): LIPASE, AMYLASE in the last 168 hours. No results for input(s): AMMONIA in the last 168 hours. CBC:  Recent Labs Lab 12/09/16 2355 12/10/16 0458  WBC 11.7* 11.4*  NEUTROABS 9.1*  --   HGB 10.4* 9.3*  HCT 31.2* 28.6*  MCV 89.9 90.8  PLT 254 232   Cardiac Enzymes: No results for input(s): CKTOTAL, CKMB, CKMBINDEX, TROPONINI in the last 168 hours. BNP: BNP (last 3 results) No results for input(s): BNP in the last 8760 hours.  ProBNP (last 3 results) No results for input(s): PROBNP in the last  8760 hours.  CBG: No results for input(s): GLUCAP in the last 168 hours.     SignedLelon Frohlich  [facility] Pager: [phone] 12/10/2016, 2:45 PM

## 2016-12-16 DIAGNOSIS — L03211 Cellulitis of face: Secondary | ICD-10-CM | POA: Diagnosis not present

## 2016-12-16 DIAGNOSIS — Z6820 Body mass index (BMI) 20.0-20.9, adult: Secondary | ICD-10-CM | POA: Diagnosis not present

## 2017-01-16 DIAGNOSIS — E875 Hyperkalemia: Secondary | ICD-10-CM | POA: Diagnosis not present

## 2017-01-16 DIAGNOSIS — I10 Essential (primary) hypertension: Secondary | ICD-10-CM | POA: Diagnosis not present

## 2017-01-16 DIAGNOSIS — D509 Iron deficiency anemia, unspecified: Secondary | ICD-10-CM | POA: Diagnosis not present

## 2017-01-16 DIAGNOSIS — E782 Mixed hyperlipidemia: Secondary | ICD-10-CM | POA: Diagnosis not present

## 2017-01-23 DIAGNOSIS — D509 Iron deficiency anemia, unspecified: Secondary | ICD-10-CM | POA: Diagnosis not present

## 2017-01-23 DIAGNOSIS — E782 Mixed hyperlipidemia: Secondary | ICD-10-CM | POA: Diagnosis not present

## 2017-01-23 DIAGNOSIS — E875 Hyperkalemia: Secondary | ICD-10-CM | POA: Diagnosis not present

## 2017-01-23 DIAGNOSIS — I10 Essential (primary) hypertension: Secondary | ICD-10-CM | POA: Diagnosis not present

## 2017-01-29 DIAGNOSIS — K219 Gastro-esophageal reflux disease without esophagitis: Secondary | ICD-10-CM | POA: Diagnosis not present

## 2017-01-29 DIAGNOSIS — I10 Essential (primary) hypertension: Secondary | ICD-10-CM | POA: Diagnosis not present

## 2017-01-29 DIAGNOSIS — E875 Hyperkalemia: Secondary | ICD-10-CM | POA: Diagnosis not present

## 2017-01-29 DIAGNOSIS — Z23 Encounter for immunization: Secondary | ICD-10-CM | POA: Diagnosis not present

## 2017-01-29 DIAGNOSIS — D631 Anemia in chronic kidney disease: Secondary | ICD-10-CM | POA: Diagnosis not present

## 2017-01-29 DIAGNOSIS — N189 Chronic kidney disease, unspecified: Secondary | ICD-10-CM | POA: Diagnosis not present

## 2017-02-03 ENCOUNTER — Encounter: Payer: Self-pay | Admitting: Obstetrics and Gynecology

## 2017-04-30 DIAGNOSIS — D0462 Carcinoma in situ of skin of left upper limb, including shoulder: Secondary | ICD-10-CM | POA: Diagnosis not present

## 2017-04-30 DIAGNOSIS — L570 Actinic keratosis: Secondary | ICD-10-CM | POA: Diagnosis not present

## 2017-04-30 DIAGNOSIS — C44319 Basal cell carcinoma of skin of other parts of face: Secondary | ICD-10-CM | POA: Diagnosis not present

## 2017-04-30 DIAGNOSIS — C44619 Basal cell carcinoma of skin of left upper limb, including shoulder: Secondary | ICD-10-CM | POA: Diagnosis not present

## 2017-04-30 DIAGNOSIS — C4401 Basal cell carcinoma of skin of lip: Secondary | ICD-10-CM | POA: Diagnosis not present

## 2017-06-17 DIAGNOSIS — I10 Essential (primary) hypertension: Secondary | ICD-10-CM | POA: Diagnosis not present

## 2017-06-17 DIAGNOSIS — J301 Allergic rhinitis due to pollen: Secondary | ICD-10-CM | POA: Diagnosis not present

## 2017-06-18 ENCOUNTER — Other Ambulatory Visit: Payer: Self-pay

## 2017-06-18 ENCOUNTER — Emergency Department (HOSPITAL_COMMUNITY)
Admission: EM | Admit: 2017-06-18 | Discharge: 2017-06-19 | Disposition: A | Discharge status: Home / Self Care | Payer: Medicare Other | Attending: Emergency Medicine | Admitting: Emergency Medicine

## 2017-06-18 ENCOUNTER — Encounter (HOSPITAL_COMMUNITY): Payer: Self-pay | Admitting: *Deleted

## 2017-06-18 DIAGNOSIS — Z79899 Other long term (current) drug therapy: Secondary | ICD-10-CM | POA: Insufficient documentation

## 2017-06-18 DIAGNOSIS — I10 Essential (primary) hypertension: Secondary | ICD-10-CM | POA: Diagnosis not present

## 2017-06-18 DIAGNOSIS — G43009 Migraine without aura, not intractable, without status migrainosus: Secondary | ICD-10-CM

## 2017-06-18 DIAGNOSIS — G43909 Migraine, unspecified, not intractable, without status migrainosus: Secondary | ICD-10-CM | POA: Diagnosis not present

## 2017-06-18 DIAGNOSIS — R51 Headache: Secondary | ICD-10-CM | POA: Diagnosis not present

## 2017-06-18 LAB — CBC WITH DIFFERENTIAL/PLATELET
Basophils Absolute: 0 10*3/uL (ref 0.0–0.1)
Basophils Relative: 0 %
Eosinophils Absolute: 0.2 10*3/uL (ref 0.0–0.7)
Eosinophils Relative: 3 %
HCT: 33.8 % — ABNORMAL LOW (ref 36.0–46.0)
Hemoglobin: 10.7 g/dL — ABNORMAL LOW (ref 12.0–15.0)
Lymphocytes Relative: 28 %
Lymphs Abs: 1.9 10*3/uL (ref 0.7–4.0)
MCH: 28 pg (ref 26.0–34.0)
MCHC: 31.7 g/dL (ref 30.0–36.0)
MCV: 88.5 fL (ref 78.0–100.0)
Monocytes Absolute: 0.6 10*3/uL (ref 0.1–1.0)
Monocytes Relative: 9 %
Neutro Abs: 4.1 10*3/uL (ref 1.7–7.7)
Neutrophils Relative %: 60 %
Platelets: 297 10*3/uL (ref 150–400)
RBC: 3.82 MIL/uL — ABNORMAL LOW (ref 3.87–5.11)
RDW: 13.7 % (ref 11.5–15.5)
WBC: 6.9 10*3/uL (ref 4.0–10.5)

## 2017-06-18 LAB — BASIC METABOLIC PANEL
Anion gap: 11 (ref 5–15)
BUN: 10 mg/dL (ref 6–20)
CO2: 21 mmol/L — ABNORMAL LOW (ref 22–32)
Calcium: 8.9 mg/dL (ref 8.9–10.3)
Chloride: 104 mmol/L (ref 101–111)
Creatinine, Ser: 0.79 mg/dL (ref 0.44–1.00)
GFR calc Af Amer: >60 mL/min (ref >60)
GFR calc non Af Amer: >60 mL/min (ref >60)
Glucose, Bld: 103 mg/dL — ABNORMAL HIGH (ref 65–99)
Potassium: 3.4 mmol/L — ABNORMAL LOW (ref 3.5–5.1)
Sodium: 136 mmol/L (ref 135–145)

## 2017-06-18 LAB — TROPONIN I: Troponin I: <0.03 ng/mL (ref <0.03)

## 2017-06-18 MED ORDER — DIPHENHYDRAMINE HCL 50 MG/ML IJ SOLN
12.5000 mg | Freq: Once | INTRAMUSCULAR | Status: AC
  Administered 2017-06-19: 12.5 mg via INTRAVENOUS
  Filled 2017-06-18: qty 1

## 2017-06-18 MED ORDER — METOCLOPRAMIDE HCL 5 MG/ML IJ SOLN
5.0000 mg | Freq: Once | INTRAMUSCULAR | Status: AC
  Administered 2017-06-19: 5 mg via INTRAVENOUS
  Filled 2017-06-18: qty 2

## 2017-06-18 MED ORDER — DEXAMETHASONE SODIUM PHOSPHATE 10 MG/ML IJ SOLN
10.0000 mg | Freq: Once | INTRAMUSCULAR | Status: AC
  Administered 2017-06-19: 10 mg via INTRAVENOUS
  Filled 2017-06-18: qty 1

## 2017-06-18 NOTE — ED Triage Notes (Signed)
Pt c/o headaches for the past week, was seen by pcp this week and was told that the headaches were related to her elevated blood pressure. Pt placed was spironolactone 12.5 mg tablet daily starting 06/17/2017. Pt states that she continues to have headaches today, family checked bp at home and it was elevated and was told to come to er by pcp.

## 2017-06-18 NOTE — ED Notes (Signed)
Pt reports that she feels like her balance is off and she noticed this yesterday,

## 2017-06-19 ENCOUNTER — Emergency Department (HOSPITAL_COMMUNITY): Payer: Medicare Other

## 2017-06-19 DIAGNOSIS — R51 Headache: Secondary | ICD-10-CM | POA: Diagnosis not present

## 2017-06-19 IMAGING — CT CT HEAD W/O CM
3 series · 15 of 47 positions shown, 18 images · non-contrast
Comparison: [DATE]

CLINICAL DATA: Headache for the past week

EXAM:
CT HEAD WITHOUT CONTRAST
TECHNIQUE: Contiguous axial images were obtained from the base of the skull
through the vertex without intravenous contrast.

[Series 2: head trauma wo · axial · 0.47mm/px · z∈[+32,+157]mm · 9 of 30 slices shown, 12 images]
[im 3/30  brain]
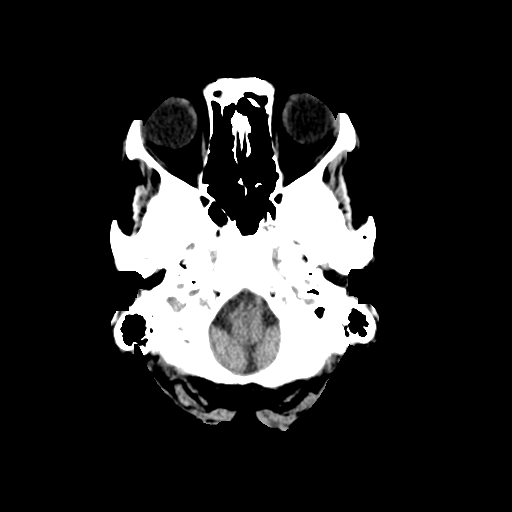
[im 3/30  bone]
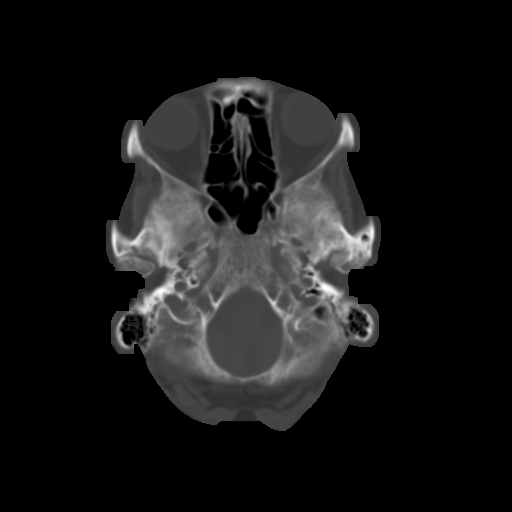
[im 6/30  brain]
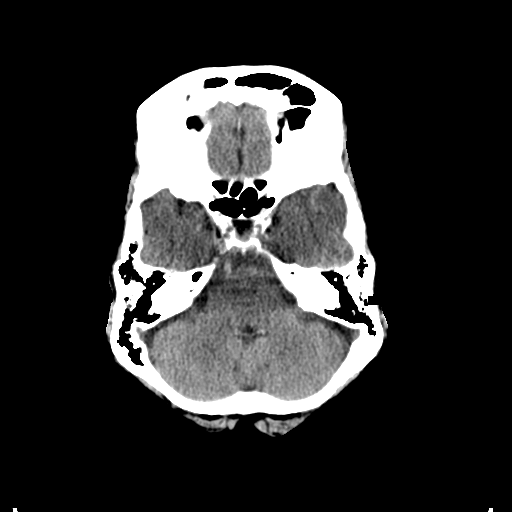
[im 9/30  brain]
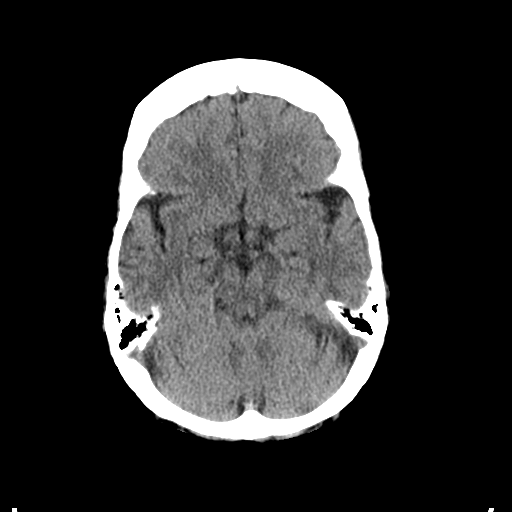
[im 12/30  brain]
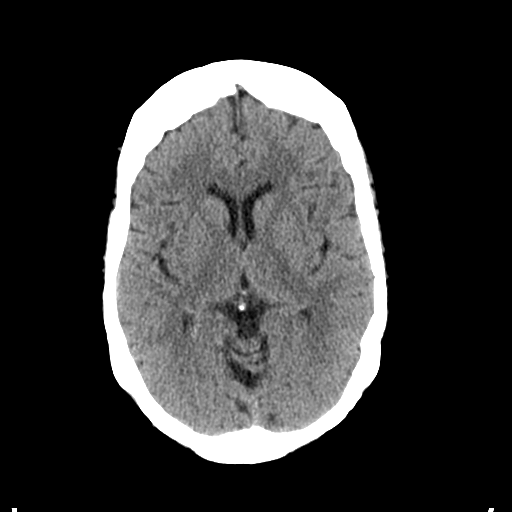
[im 16/30  brain]
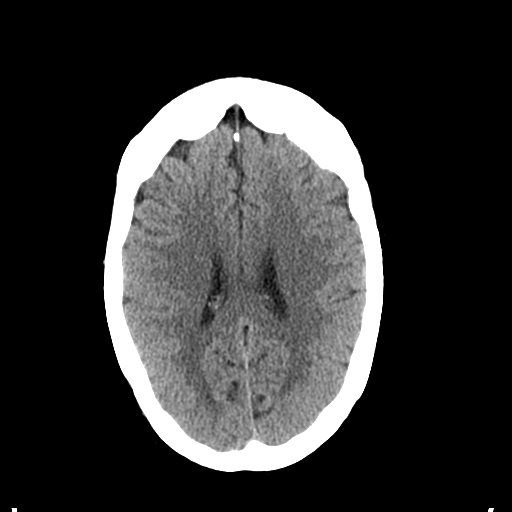
[im 16/30  bone]
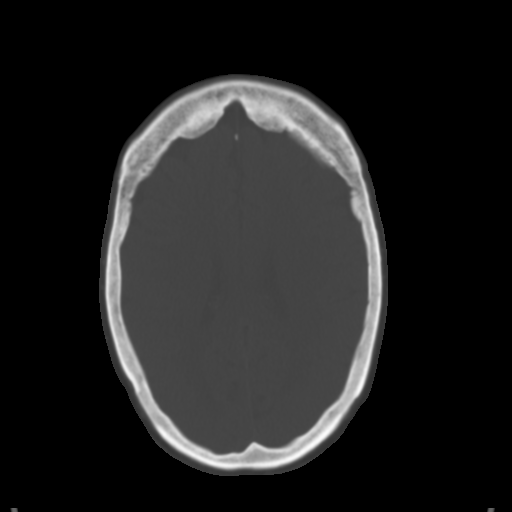
[im 19/30  brain]
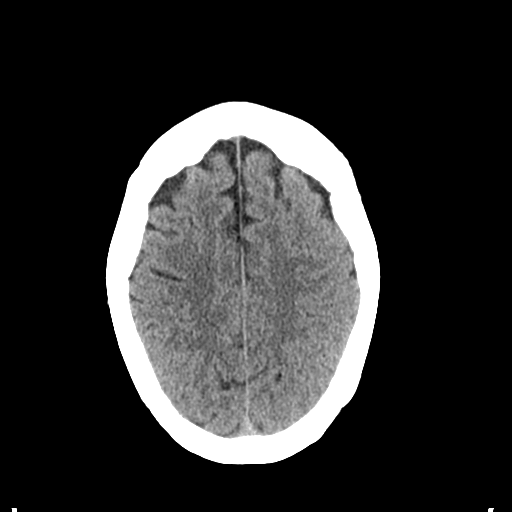
[im 22/30  brain]
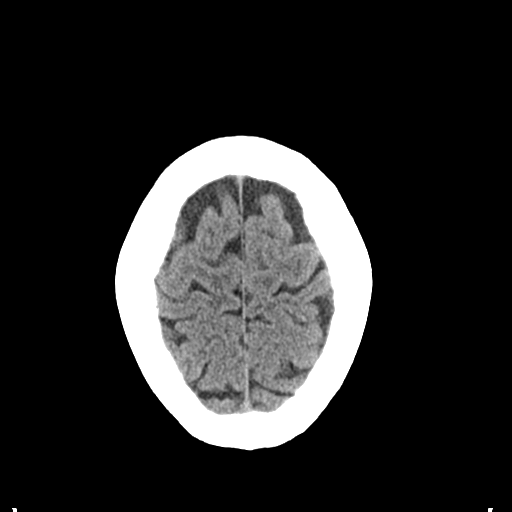
[im 25/30  brain]
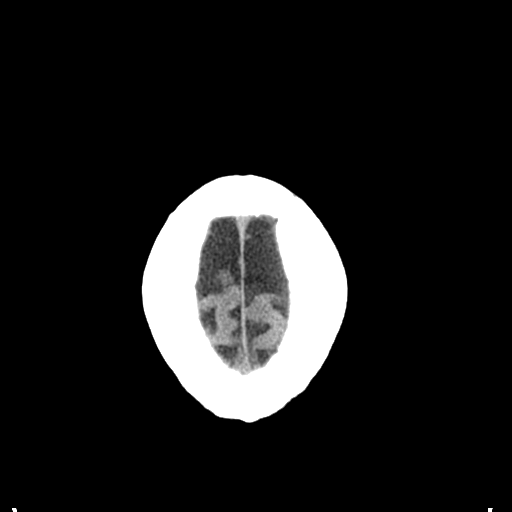
[im 28/30  brain]
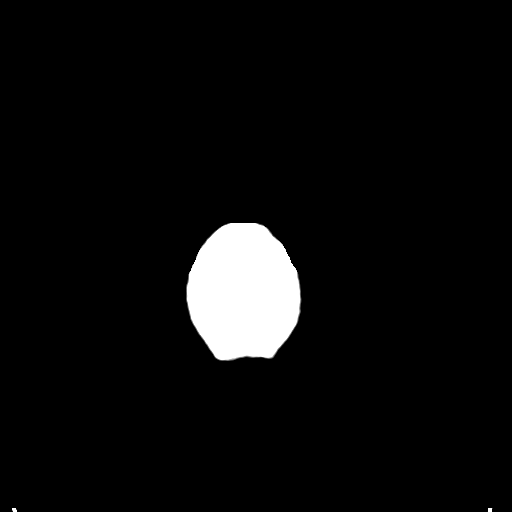
[im 28/30  bone]
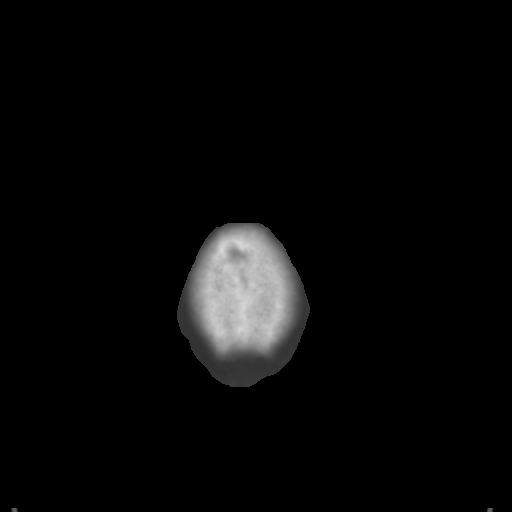

[Series 4: coronal soft tissue · coronal · 0.33mm/px · 3 of 66 slices shown]
[im 22/66  brain]
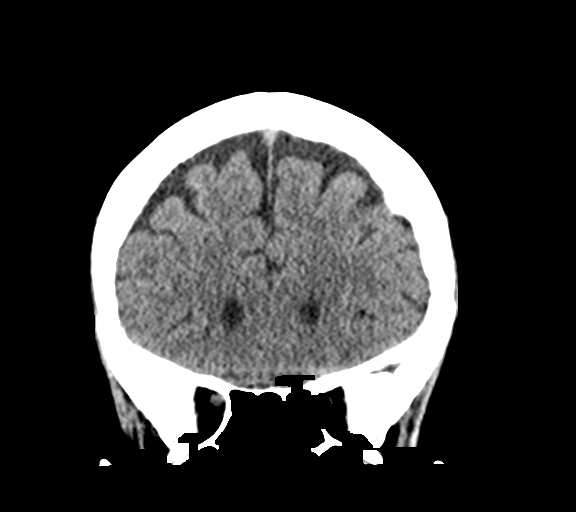
[im 29/66  brain]
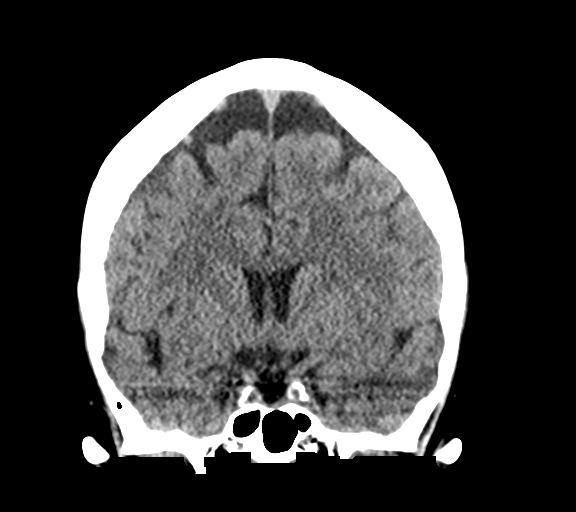
[im 37/66  brain]
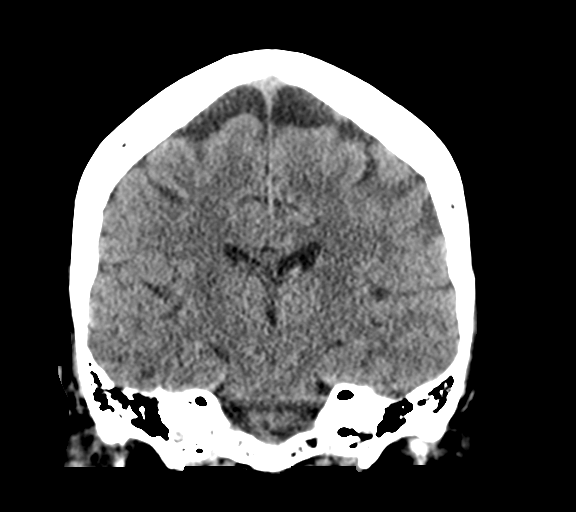

[Series 5: sagittal soft tissue · sagittal · 0.29mm/px · 3 of 48 slices shown]
[im 16/48  brain]
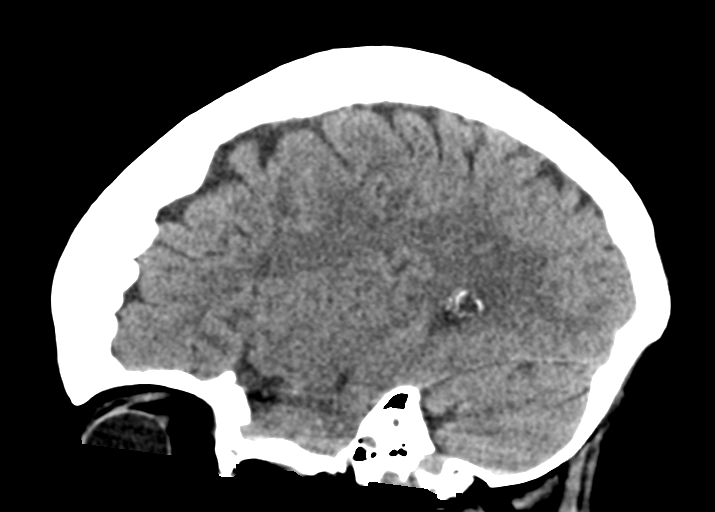
[im 24/48  brain]
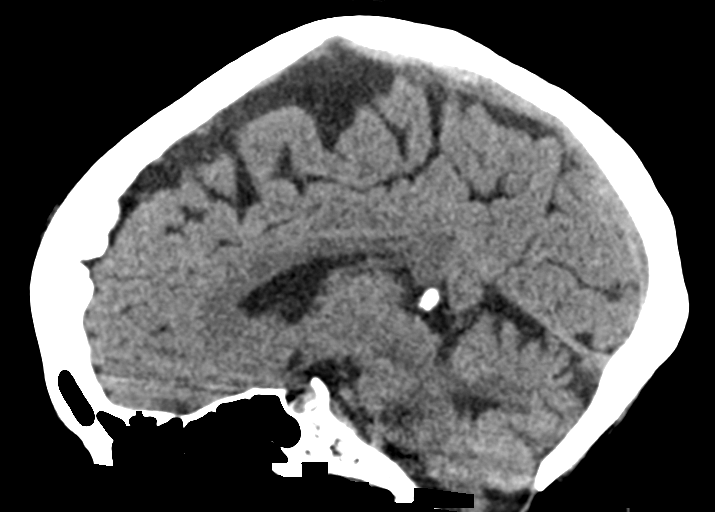
[im 32/48  brain]
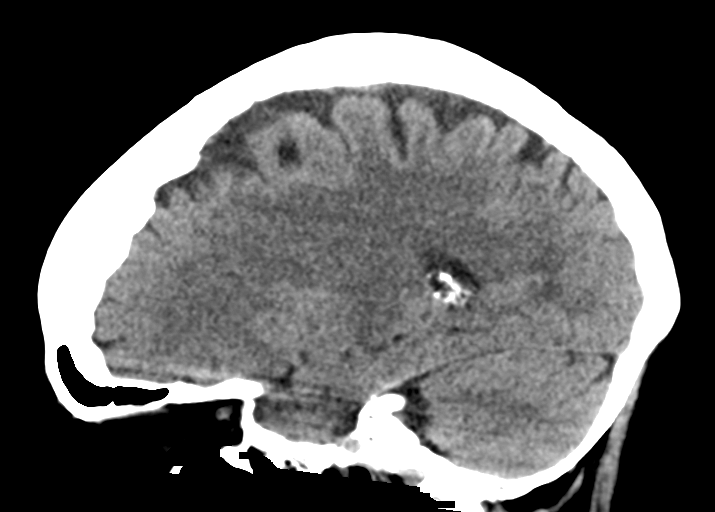

[15 of 47 positions shown; findings below may reference images not displayed]

FINDINGS: BRAIN: The ventricles and sulci are normal. No intraparenchymal
hemorrhage, mass effect nor midline shift. No acute large vascular
territory infarcts. Grey-white matter distinction is maintained. The
basal ganglia are unremarkable. No abnormal extra-axial fluid
collections. Basal cisterns are not effaced and midline. The
brainstem and cerebellar hemispheres are without acute
abnormalities.

VASCULAR: No hyperdense vessel sign.

SKULL/SOFT TISSUES: No skull fracture. No significant soft tissue
swelling. Hyperostosis frontalis interna.

ORBITS/SINUSES: The included ocular globes and orbital contents
unremarkable apart from bilateral lens replacements. Mastoid air
cells are clear. The included paranasal sinuses are well-aerated.

OTHER: None.
IMPRESSION: Normal for age CT of the head.

## 2017-06-19 NOTE — ED Provider Notes (Signed)
[facility] EMERGENCY DEPARTMENT Provider Note   CSN: [id number] Arrival date & time: 06/18/17  2122  Time seen 23:40 PM   History   Chief Complaint Chief Complaint  Patient presents with  . Hypertension    HPI Renee Blake is a 84 y.o. female.  HPI patient states she has had a headache constantly since last week.  It is diffuse and goes into the back of her head into her neck and into her ears.  She states it is a throbbing and hurting pain.  She has nausea without vomiting.  She states bright lights make the pain worse, nothing makes it feel better.  She has tried Tylenol without improvement.  She states that yesterday she felt off balance and thought she was staggering when she walked however she did not always fall to one side.  She denies any spinning or movement with the discomfort.  She states she has blurred vision off and on.  She denies any numbness or tingling of her extremities.  She was seen by her PCP on March 19 and they added a fluid pill to help control her blood pressure.  She states she is never had a headache like this before.  She last took Tylenol at 6 PM without improvement.  PCP Sandi Mealy, MD   Past Medical History:  Diagnosis Date  . Acute blood loss anemia   . Chronic diarrhea   . Dilation of biliary tract    biliary duct per 02/26/13 CT  . Duodenal stricture   . Esophageal stricture   . Gastric ulcer   . GI bleed   . Hyperlipemia   . Hypertension   . Microscopic colitis   . Nephrolithiasis   . Pancreatic duct dilated 02/26/13  . Schatzki's ring   . Skin cancer   . Sliding hiatal hernia   . Vertigo     Patient Active Problem List   Diagnosis Date Noted  . Cellulitis 12/10/2016  . Facial cellulitis   . Erroneous encounter - disregard 03/17/2015  . AKI (acute kidney injury) ([place])   . Metabolic acidosis 19/14/7829  . Acute renal failure syndrome ([place])   . Renal failure 08/22/2014  . Hyperkalemia 08/22/2014  . UTI (urinary  tract infection) 08/22/2014  . Acute renal failure ([place]) 08/22/2014  . Dyspnea 03/15/2014  . Diabetes mellitus ([place]) 06/08/2013  . Acute diastolic congestive heart failure ([place]) 06/07/2013  . Syncope 03/26/2013  . Rectal bleeding 03/26/2013  . Dehydration 03/26/2013  . Generalized weakness 03/26/2013  . Malnutrition of moderate degree ([place]) 03/01/2013  . Diarrhea 02/28/2013  . Loss of weight 02/28/2013  . Acute duodenal ulcer with hemorrhage, without mention of obstruction 02/27/2013  . GIB (gastrointestinal bleeding) 02/26/2013  . Acute blood loss anemia 02/26/2013  . UTI (lower urinary tract infection) 02/26/2013  . Hypertension 02/26/2013    Past Surgical History:  Procedure Laterality Date  . ESOPHAGOGASTRODUODENOSCOPY N/A 02/27/2013   Procedure: ESOPHAGOGASTRODUODENOSCOPY (EGD);  Surgeon: Ladene Artist, MD;  Location[facility] ENDOSCOPY;  Service: Endoscopy;  Laterality: N/A;  . ESOPHAGOGASTRODUODENOSCOPY N/A 03/28/2013   Procedure: ESOPHAGOGASTRODUODENOSCOPY (EGD);  Surgeon: Rogene Houston, MD;  Location: [facility] ENDO SUITE;  Service: Endoscopy;  Laterality: N/A;  . HOT HEMOSTASIS N/A 02/27/2013   Procedure: HOT HEMOSTASIS (ARGON PLASMA COAGULATION/BICAP);  Surgeon: Ladene Artist, MD;  Location[facility] ENDOSCOPY;  Service: Endoscopy;  Laterality: N/A;  . NOSE SURGERY     for skin cancer    OB History    [place]  6   Para  6   Term  6   Preterm      AB      Living  6     SAB      TAB      Ectopic      Multiple      Live Births               Home Medications    Prior to Admission medications   Medication Sig Start Date End Date Taking? Authorizing Provider  ALPRAZolam (XANAX) 0.25 MG tablet Take 0.25 mg by mouth 2 (two) times daily as needed. for anxiety 02/17/15   [provider]  amLODipine (NORVASC) 5 MG tablet Take 1 tablet (5 mg total) by mouth daily. 02/25/15   [name], MD  aspirin EC 81 MG tablet Take 81 mg by mouth daily.    [provider]  bisacodyl (DULCOLAX) 5 MG EC tablet Take 5 mg by mouth daily as needed for moderate constipation.    [provider]  cholestyramine (QUESTRAN) 4 g packet MIX AND TAKE ONE PACKET BY MOUTH AS DIRECTED DAILY. 08/23/16   [provider]  furosemide (LASIX) 20 MG tablet Take 20 mg by mouth daily. 03/08/14   [provider]  loperamide (IMODIUM) 2 MG capsule Take 2 mg by mouth as needed for diarrhea or loose stools.  02/26/14   [provider]  losartan (COZAAR) 50 MG tablet Take 50 mg by mouth 2 (two) times daily.     [provider]  meclizine (ANTIVERT) 25 MG tablet Take 25 mg by mouth 3 (three) times daily as needed for dizziness.  03/15/14   [provider]  naphazoline-glycerin (CLEAR EYES) 0.012-0.2 % SOLN Place 1-2 drops into both eyes 4 (four) times daily as needed for irritation.    [provider]  pantoprazole (PROTONIX) 40 MG tablet Take 40 mg by mouth every morning.     [provider]  Pediatric Multiple Vit-C-FA (FLINSTONES GUMMIES OMEGA-3 DHA PO) Take by mouth.    [provider]  potassium chloride (K-DUR,KLOR-CON) 10 MEQ tablet Take 2 tablets (20 mEq total) by mouth 2 (two) times daily. 02/25/15   [name], MD    Family History Family History  Problem Relation Age of Onset  . Heart attack Father     Social History Social History   Tobacco Use  . Smoking status: Never Smoker  . Smokeless tobacco: Never Used  Substance Use Topics  . Alcohol use: No    Alcohol/week: 0.0 oz  . Drug use: No  lives at home Daughter lives with her Does not use a cane or walker   Allergies   Codeine and Sulfa antibiotics   Review of Systems Review of Systems  All other systems reviewed and are negative.    Physical Exam Updated Vital Signs BP 134/72 (BP Location: Right Arm)   Pulse 77   Temp 98.1 F (36.7 C) (Temporal)   Resp 16   Ht 5\' 6"  (1.676 m)   Wt 53.5 kg (118 lb)   SpO2  96%   BMI 19.05 kg/m   Vital signs normal    Physical Exam  Constitutional: She is oriented to person, place, and time. She appears well-developed and well-nourished.  Non-toxic appearance. She does not appear ill. No distress.  HENT:  Head: Normocephalic and atraumatic.  Right Ear: External ear normal.  Left Ear: External ear normal.  Nose: Nose normal. No mucosal  edema or rhinorrhea.  Mouth/Throat: Oropharynx is clear and moist and mucous membranes are normal. No dental abscesses or uvula swelling.  Eyes: Pupils are equal, round, and reactive to light. Conjunctivae and EOM are normal.  No nystagmus  Neck: Normal range of motion and full passive range of motion without pain. Neck supple.  Cardiovascular: Normal rate, regular rhythm and normal heart sounds. Exam reveals no gallop and no friction rub.  No murmur heard. Pulmonary/Chest: Effort normal and breath sounds normal. No respiratory distress. She has no wheezes. She has no rhonchi. She has no rales. She exhibits no tenderness and no crepitus.  Abdominal: Soft. Normal appearance and bowel sounds are normal. She exhibits no distension. There is no tenderness. There is no rebound and no guarding.  Musculoskeletal: Normal range of motion. She exhibits no edema or tenderness.  Moves all extremities well.   Neurological: She is alert and oriented to person, place, and time. She has normal strength. No cranial nerve deficit.  Grips are equal, there is no pronator drift, finger to nose is coordinated bilaterally.  There is no motor weakness in her extremities.  Skin: Skin is warm, dry and intact. No rash noted. No erythema. No pallor.  Psychiatric: She has a normal mood and affect. Her speech is normal and behavior is normal. Her mood appears not anxious.  Nursing note and vitals reviewed.    ED Treatments / Results  Labs (all labs ordered are listed, but only abnormal results are displayed) Results for orders placed or performed  during the hospital encounter of 06/18/17  CBC with Differential  Result Value Ref Range   WBC 6.9 4.0 - 10.5 K/uL   RBC 3.82 (L) 3.87 - 5.11 MIL/uL   Hemoglobin 10.7 (L) 12.0 - 15.0 g/dL   HCT 33.8 (L) 36.0 - 46.0 %   MCV 88.5 78.0 - 100.0 fL   MCH 28.0 26.0 - 34.0 pg   MCHC 31.7 30.0 - 36.0 g/dL   RDW 13.7 11.5 - 15.5 %   Platelets 297 150 - 400 K/uL   Neutrophils Relative % 60 %   Neutro Abs 4.1 1.7 - 7.7 K/uL   Lymphocytes Relative 28 %   Lymphs Abs 1.9 0.7 - 4.0 K/uL   Monocytes Relative 9 %   Monocytes Absolute 0.6 0.1 - 1.0 K/uL   Eosinophils Relative 3 %   Eosinophils Absolute 0.2 0.0 - 0.7 K/uL   Basophils Relative 0 %   Basophils Absolute 0.0 0.0 - 0.1 K/uL  Basic metabolic panel  Result Value Ref Range   Sodium 136 135 - 145 mmol/L   Potassium 3.4 (L) 3.5 - 5.1 mmol/L   Chloride 104 101 - 111 mmol/L   CO2 21 (L) 22 - 32 mmol/L   Glucose, Bld 103 (H) 65 - 99 mg/dL   BUN 10 6 - 20 mg/dL   Creatinine, Ser 0.79 0.44 - 1.00 mg/dL   Calcium 8.9 8.9 - 10.3 mg/dL   GFR calc non Af Amer >60 >60 mL/min   GFR calc Af Amer >60 >60 mL/min   Anion gap 11 5 - 15  Troponin I  Result Value Ref Range   Troponin I <0.03 <0.03 ng/mL   Laboratory interpretation all normal except mild anemia and mild hypokalemia    EKG  EKG Interpretation  Date/Time:  Wednesday June 18 2017 21:35:38 EDT Ventricular Rate:  71 PR Interval:    QRS Duration: 92 QT Interval:  390 QTC Calculation: 423 R Axis:  3 Text Interpretation:  Normal sinus rhythm Premature atrial complexes Minimal voltage criteria for LVH, may be normal variant No significant change since last tracing 25 Feb 2015 Confirmed by Rolland Porter [phone]) on 06/18/2017 11:55:08 PM       Radiology Ct Head Wo Contrast  Result Date: 06/19/2017 CLINICAL DATA:  Headache for the past week EXAM: CT HEAD WITHOUT CONTRAST TECHNIQUE: Contiguous axial images were obtained from the base of the skull through the vertex without intravenous  contrast. COMPARISON:  02/25/2015 FINDINGS: BRAIN: The ventricles and sulci are normal. No intraparenchymal hemorrhage, mass effect nor midline shift. No acute large vascular territory infarcts. Grey-white matter distinction is maintained. The basal ganglia are unremarkable. No abnormal extra-axial fluid collections. Basal cisterns are not effaced and midline. The brainstem and cerebellar hemispheres are without acute abnormalities. VASCULAR: No hyperdense vessel sign. SKULL/SOFT TISSUES: No skull fracture. No significant soft tissue swelling. Hyperostosis frontalis interna. ORBITS/SINUSES: The included ocular globes and orbital contents unremarkable apart from bilateral lens replacements. Mastoid air cells are clear. The included paranasal sinuses are well-aerated. OTHER: None. IMPRESSION: Normal for age CT of the head. Electronically Signed   By: Ashley Royalty M.D.   On: 06/19/2017 00:46    Procedures Procedures (including critical care time)  Medications Ordered in ED Medications  metoCLOPramide (REGLAN) injection 5 mg (5 mg Intravenous Given 06/19/17 0011)  diphenhydrAMINE (BENADRYL) injection 12.5 mg (12.5 mg Intravenous Given 06/19/17 0010)  dexamethasone (DECADRON) injection 10 mg (10 mg Intravenous Given 06/19/17 0010)     Initial Impression / Assessment and Plan / ED Course  I have reviewed the triage vital signs and the nursing notes.  Pertinent labs & imaging results that were available during my care of the patient were reviewed by me and considered in my medical decision making (see chart for details).     Patient states she is never had this headache before, CT of the head was done.  The headache has been present for a week.  I do not find any evidence of an acute neurological event on exam.  She was given migraine cocktail at reduced doses because of her age.  Her blood pressure improved from 180/81 when she came into triage to 134/72 at time of discharge without specific  treatment.  1:45 AM blood pressure is 153/67, patient states her headache is gone.  Patient was discharged at 2 AM with easy to read hypertension and easy to read migraine printed discharge instructions.  She was advised to follow-up with her PCP.  She should continue the new blood pressure medication that was started on the 19th.  We discussed that it can take 1-2 weeks for blood pressure medication to start working.    Final Clinical Impressions(s) / ED Diagnoses   Final diagnoses:  Essential hypertension  Migraine without aura and without status migrainosus, not intractable    ED Discharge Orders    None     Plan discharge  Rolland Porter, MD, Barbette Or, MD 06/19/17 [phone]

## 2017-06-19 NOTE — ED Notes (Signed)
Pt ambulatory to waiting room. Pt verbalized understanding of discharge instructions.   

## 2017-06-19 NOTE — Discharge Instructions (Signed)
Let your doctor know about your ED visit tonight. Continue your new fluid pill for your blood pressure.

## 2017-06-25 DIAGNOSIS — C4401 Basal cell carcinoma of skin of lip: Secondary | ICD-10-CM | POA: Diagnosis not present

## 2017-07-09 DIAGNOSIS — I10 Essential (primary) hypertension: Secondary | ICD-10-CM | POA: Diagnosis not present

## 2017-07-10 ENCOUNTER — Encounter (HOSPITAL_COMMUNITY): Payer: Self-pay | Admitting: Emergency Medicine

## 2017-07-10 ENCOUNTER — Other Ambulatory Visit: Payer: Self-pay

## 2017-07-10 ENCOUNTER — Emergency Department (HOSPITAL_COMMUNITY)
Admission: EM | Admit: 2017-07-10 | Discharge: 2017-07-11 | Disposition: A | Discharge status: Home / Self Care | Payer: Medicare Other | Attending: Emergency Medicine | Admitting: Emergency Medicine

## 2017-07-10 ENCOUNTER — Emergency Department (HOSPITAL_COMMUNITY): Payer: Medicare Other

## 2017-07-10 DIAGNOSIS — Z87448 Personal history of other diseases of urinary system: Secondary | ICD-10-CM | POA: Diagnosis not present

## 2017-07-10 DIAGNOSIS — K625 Hemorrhage of anus and rectum: Secondary | ICD-10-CM | POA: Diagnosis not present

## 2017-07-10 DIAGNOSIS — I10 Essential (primary) hypertension: Secondary | ICD-10-CM | POA: Diagnosis not present

## 2017-07-10 DIAGNOSIS — N8189 Other female genital prolapse: Secondary | ICD-10-CM | POA: Diagnosis not present

## 2017-07-10 DIAGNOSIS — N281 Cyst of kidney, acquired: Secondary | ICD-10-CM | POA: Diagnosis not present

## 2017-07-10 DIAGNOSIS — N938 Other specified abnormal uterine and vaginal bleeding: Secondary | ICD-10-CM | POA: Diagnosis present

## 2017-07-10 DIAGNOSIS — Z85828 Personal history of other malignant neoplasm of skin: Secondary | ICD-10-CM | POA: Insufficient documentation

## 2017-07-10 LAB — WET PREP, GENITAL
Sperm: NONE SEEN
Trich, Wet Prep: NONE SEEN
Yeast Wet Prep HPF POC: NONE SEEN

## 2017-07-10 LAB — CBC WITH DIFFERENTIAL/PLATELET
Basophils Absolute: 0 10*3/uL (ref 0.0–0.1)
Basophils Relative: 0 %
Eosinophils Absolute: 0.2 10*3/uL (ref 0.0–0.7)
Eosinophils Relative: 3 %
HCT: 33 % — ABNORMAL LOW (ref 36.0–46.0)
Hemoglobin: 10.6 g/dL — ABNORMAL LOW (ref 12.0–15.0)
Lymphocytes Relative: 24 %
Lymphs Abs: 1.7 10*3/uL (ref 0.7–4.0)
MCH: 28.6 pg (ref 26.0–34.0)
MCHC: 32.1 g/dL (ref 30.0–36.0)
MCV: 88.9 fL (ref 78.0–100.0)
Monocytes Absolute: 0.5 10*3/uL (ref 0.1–1.0)
Monocytes Relative: 7 %
Neutro Abs: 4.7 10*3/uL (ref 1.7–7.7)
Neutrophils Relative %: 66 %
Platelets: 312 10*3/uL (ref 150–400)
RBC: 3.71 MIL/uL — ABNORMAL LOW (ref 3.87–5.11)
RDW: 14.8 % (ref 11.5–15.5)
WBC: 7.2 10*3/uL (ref 4.0–10.5)

## 2017-07-10 LAB — PROTIME-INR
INR: 1.01
Prothrombin Time: 13.2 seconds (ref 11.4–15.2)

## 2017-07-10 LAB — BASIC METABOLIC PANEL
Anion gap: 10 (ref 5–15)
BUN: 12 mg/dL (ref 6–20)
CO2: 21 mmol/L — ABNORMAL LOW (ref 22–32)
Calcium: 9.5 mg/dL (ref 8.9–10.3)
Chloride: 106 mmol/L (ref 101–111)
Creatinine, Ser: 0.88 mg/dL (ref 0.44–1.00)
GFR calc Af Amer: >60 mL/min (ref >60)
GFR calc non Af Amer: 57 mL/min — ABNORMAL LOW (ref >60)
Glucose, Bld: 95 mg/dL (ref 65–99)
Potassium: 3.9 mmol/L (ref 3.5–5.1)
Sodium: 137 mmol/L (ref 135–145)

## 2017-07-10 LAB — URINALYSIS, ROUTINE W REFLEX MICROSCOPIC
Bilirubin Urine: NEGATIVE
Glucose, UA: NEGATIVE mg/dL
Hgb urine dipstick: NEGATIVE
Ketones, ur: NEGATIVE mg/dL
Leukocytes, UA: NEGATIVE
Nitrite: NEGATIVE
Protein, ur: NEGATIVE mg/dL
Specific Gravity, Urine: 1.002 — ABNORMAL LOW (ref 1.005–1.030)
pH: 6 (ref 5.0–8.0)

## 2017-07-10 LAB — POC OCCULT BLOOD, ED: Fecal Occult Bld: POSITIVE — AB

## 2017-07-10 IMAGING — CT CT ABD-PELV W/ CM
2 of 4 series · 16 of 46 positions shown, 18 images · IV contrast (Isovue)
Comparison: [DATE]

CLINICAL DATA: Heavy vaginal bleeding x1 week.

EXAM:
CT ABDOMEN AND PELVIS WITH CONTRAST
TECHNIQUE: Multidetector CT imaging of the abdomen and pelvis was performed
using the standard protocol following bolus administration of
intravenous contrast.
CONTRAST:  100mL [U9] IOPAMIDOL ([U9]) INJECTION 61%

[Series 2: axial st · axial · 0.71mm/px · z∈[+1151,+1551]mm · 13 of 88 slices shown, 15 images]
[im 4/88  soft-tissue]
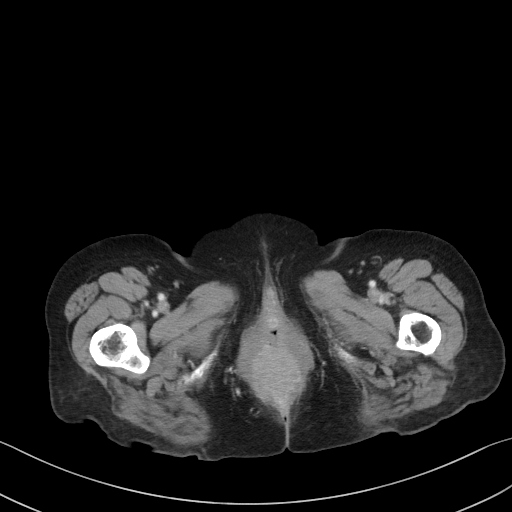
[im 4/88  bone]
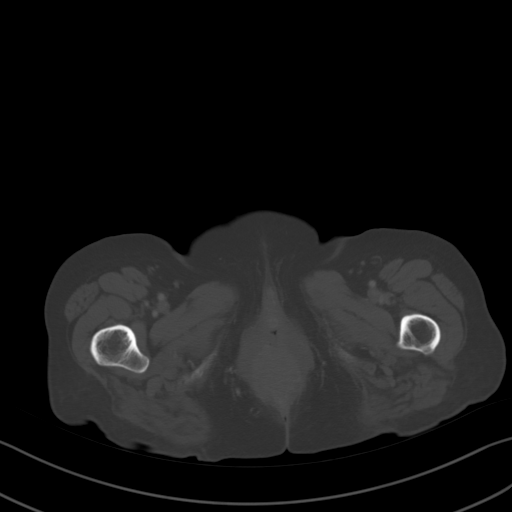
[im 12/88  soft-tissue]
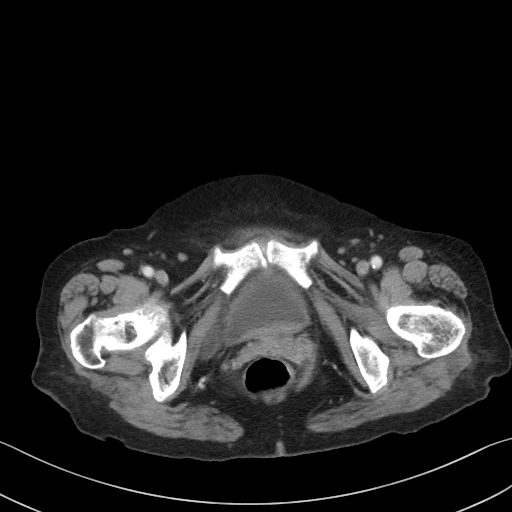
[im 20/88  soft-tissue]
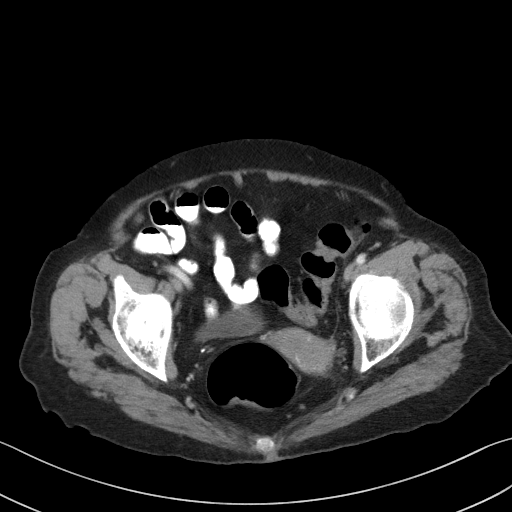
[im 24/88  soft-tissue]
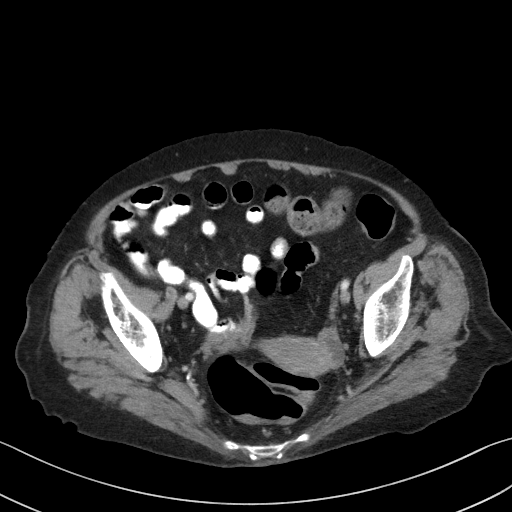
[im 32/88  soft-tissue]
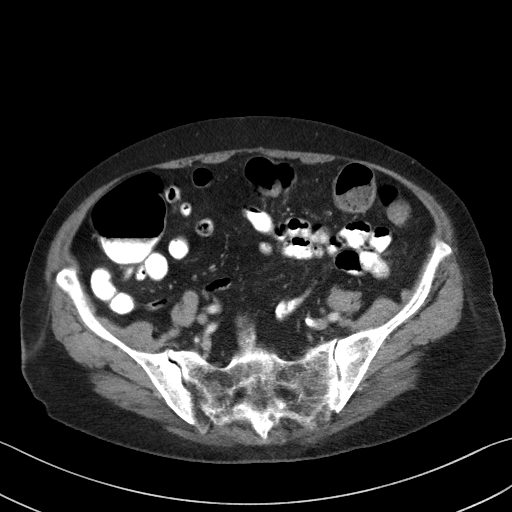
[im 36/88  soft-tissue]
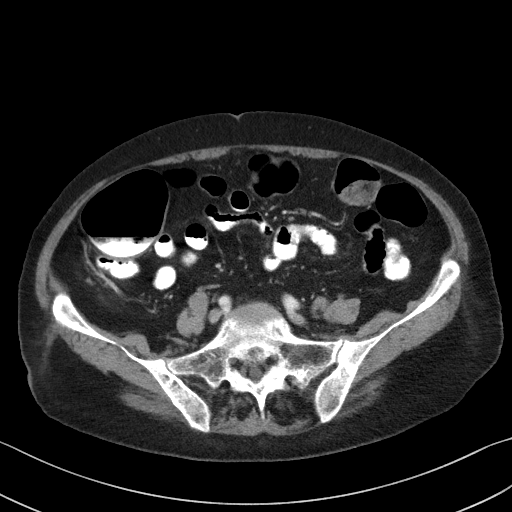
[im 44/88  soft-tissue]
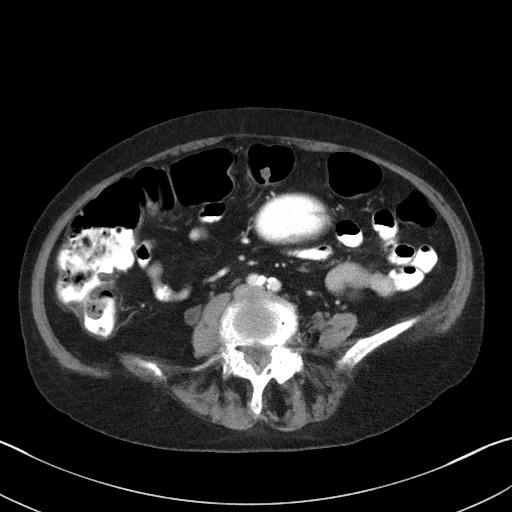
[im 52/88  soft-tissue]
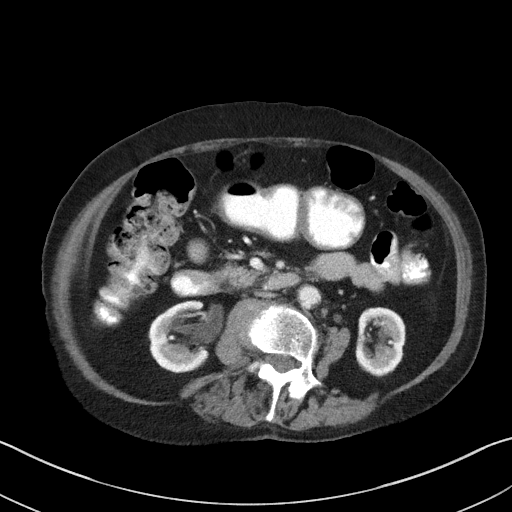
[im 56/88  soft-tissue]
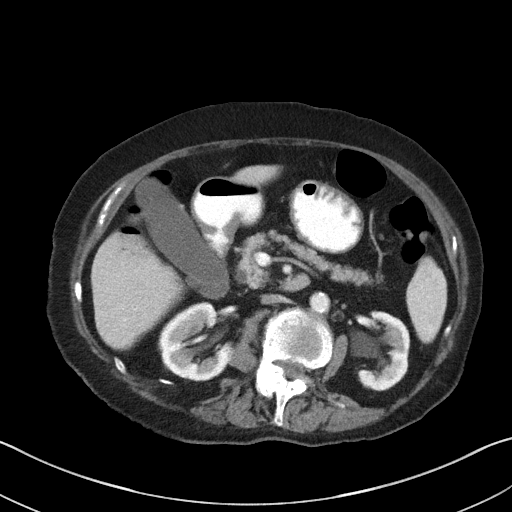
[im 56/88  bone]
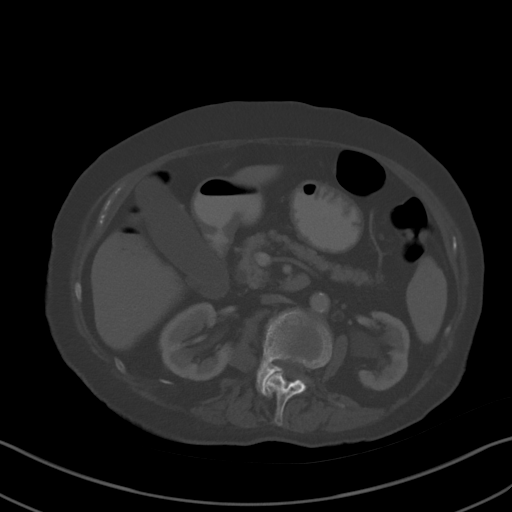
[im 64/88  soft-tissue]
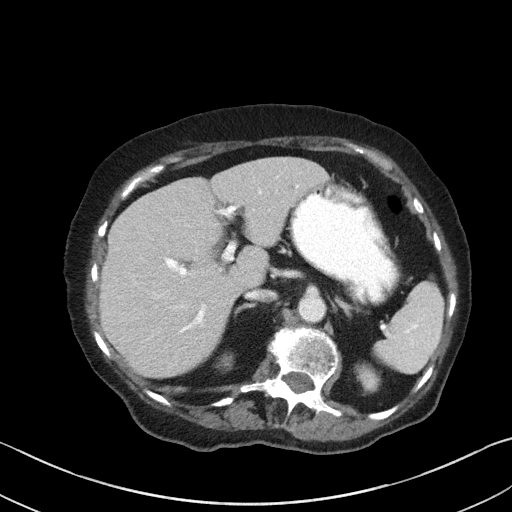
[im 68/88  soft-tissue]
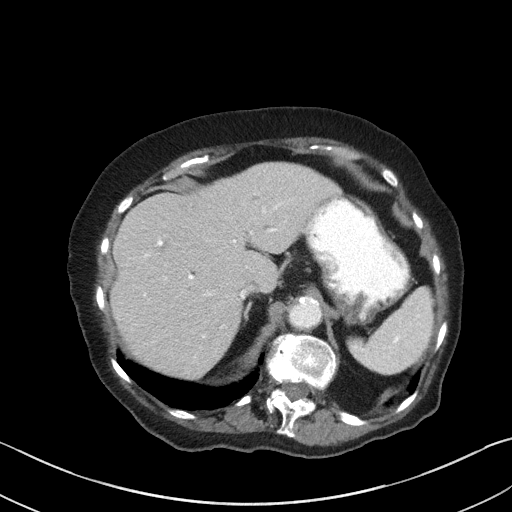
[im 76/88  soft-tissue]
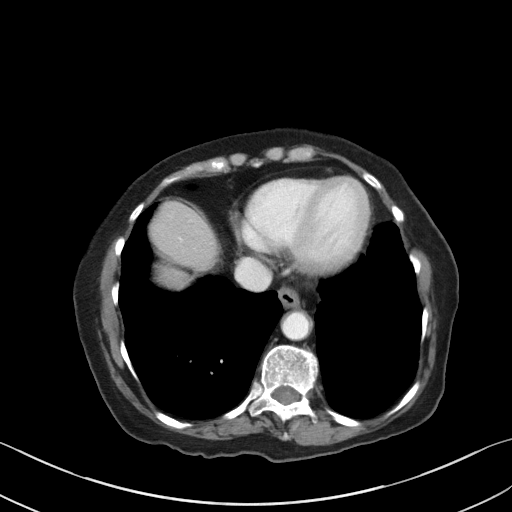
[im 84/88  soft-tissue]
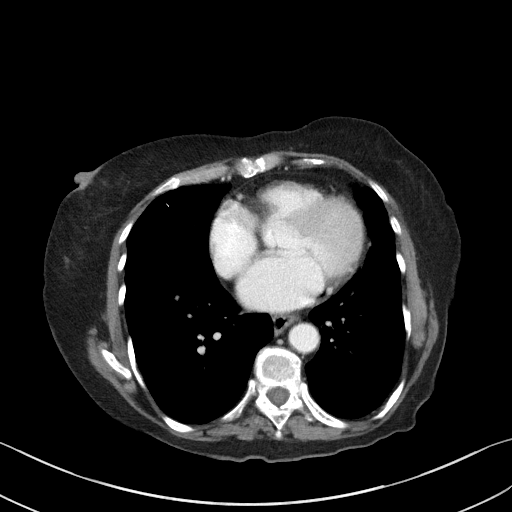

[Series 5: coronal st · coronal · 0.77mm/px · 3 of 86 slices shown]
[im 29/86  soft-tissue]
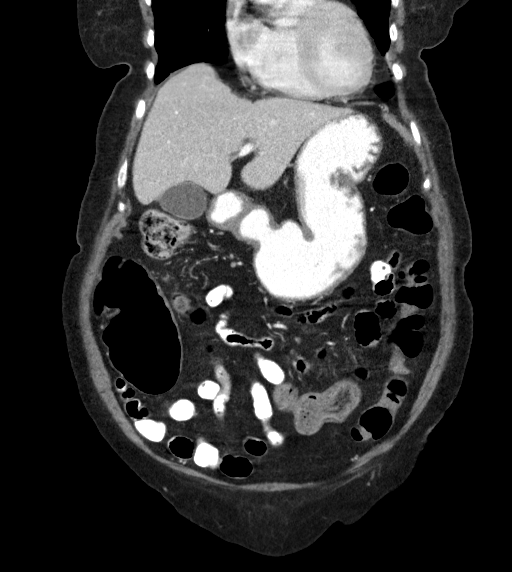
[im 38/86  soft-tissue]
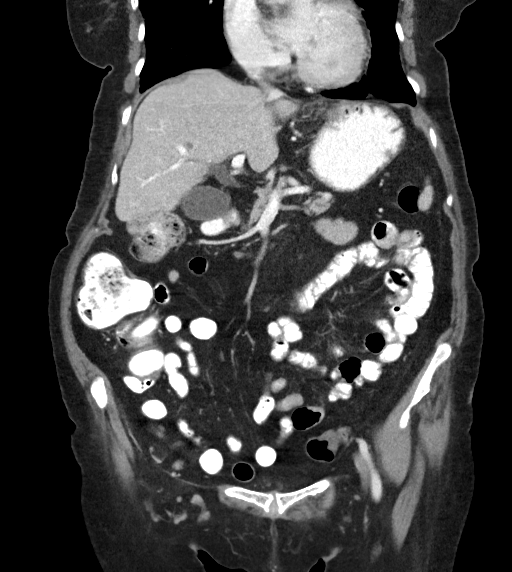
[im 48/86  soft-tissue]
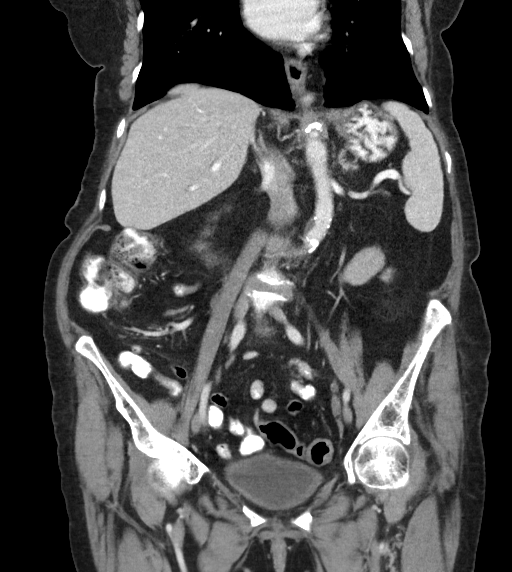

[16 of 46 positions shown; findings below may reference images not displayed]

FINDINGS: Lower chest: Top-normal size heart without pericardial effusion.
Scarring and/or atelectasis in the lingula. No effusion or
pneumothorax.

Hepatobiliary: No focal liver abnormality is seen. Mild intra and
extrahepatic ductal dilatation, stable in appearance without
choledocholithiasis. No gallstones or secondary signs of acute
cholecystitis.

Pancreas: Atrophic pancreas without inflammation or mass.

Spleen: Stable hypervascular lesions in the inferior pole of the
spleen measuring approximately 6 mm in diameter, stable in
appearance and likely representing a benign finding such as
hemangiomas or hemopoietic rests.

Adrenals/Urinary Tract: Normal bilateral adrenal glands. Water
attenuating cyst off the upper pole of the right kidney measuring 22
mm, unchanged in appearance. Punctate nonobstructing right lower
pole renal calculus. Chronic stable ureterectasis and extrarenal
pelvis on the right. Decompressed urinary bladder with posterior
inferior bladder wall thickening as before. No discrete enhancing
bladder lesions.

Stomach/Bowel: Contrast distention of the stomach normal small bowel
rotation. No bowel obstruction or inflammation. Normal appendix.
Average amount of fecal retention within the colon without
inflammation.

Vascular/Lymphatic: Moderate aortoiliac atherosclerosis. No
lymphadenopathy.

Reproductive: Pelvic floor relaxation with mild prolapse the vagina
and rectum.

Other: No free air nor free fluid.

Musculoskeletal: Stable lumbar levoscoliosis with degenerative disc
and facet arthropathy of the lumbar spine.
IMPRESSION: 1. Pelvic floor relaxation with what appears to be mild prolapse of
the rectum and vaginal canal.
2. Chronic stable findings otherwise.

## 2017-07-10 MED ORDER — IOPAMIDOL (ISOVUE-300) INJECTION 61%
100.0000 mL | Freq: Once | INTRAVENOUS | Status: AC | PRN
  Administered 2017-07-10: 100 mL via INTRAVENOUS

## 2017-07-10 MED ORDER — SODIUM CHLORIDE 0.9 % IV BOLUS
500.0000 mL | Freq: Once | INTRAVENOUS | Status: AC
  Administered 2017-07-10: 500 mL via INTRAVENOUS

## 2017-07-10 NOTE — ED Triage Notes (Signed)
Patient with heavy vaginal bleeding x 1 week. Per family report patient was supposed to have surgery last year but decided not to have it. Patient weak.

## 2017-07-10 NOTE — Discharge Instructions (Signed)
You were seen in the ED today with bleeding. Your exam shows that the bleeding actually appears to be coming from the rectum. Your pelvic floor does have some weakness as seen before so I attached the name of the OB/Gyn who you were referred to in August for follow up. The more urgent follow up appointment should be with Dr. Laural Golden for the GI bleeding. Your blood counts are not lower than normal but Dr. Laural Golden will need to evaluate you and see if you need a colonoscopy.   Continue to monitor your symptoms and return if you develop and worsening bleeding, pain, fever, chills, or other concerning symptoms.

## 2017-07-10 NOTE — ED Notes (Signed)
Pt and family updated on delay,

## 2017-07-10 NOTE — ED Provider Notes (Signed)
Emergency Department Provider Note   I have reviewed the triage vital signs and the nursing notes.   HISTORY  Chief Complaint Vaginal Bleeding   HPI Renee Blake is a 84 y.o. female with PMH of GI bleed, HLD, HTN, and skin cancer presents to the emergency department for evaluation of vaginal bleeding over the last week.  The patient has had one episode of bladder prolapse with some mild bleeding in July of 2018.  She is followed up with OB/GYN initially but was referred to a second provider and never continued the appointment.  She has had no symptoms since that time.  Her bleeding began 1 week ago and has been persistent since that time.  She is feeling fatigued and generally weak.  No fevers or chills.  No dysuria, hesitancy, or urgency. Denies any diarrhea or vomiting.   Past Medical History:  Diagnosis Date  . Acute blood loss anemia   . Chronic diarrhea   . Dilation of biliary tract    biliary duct per 02/26/13 CT  . Duodenal stricture   . Esophageal stricture   . Gastric ulcer   . GI bleed   . Hyperlipemia   . Hypertension   . Microscopic colitis   . Nephrolithiasis   . Pancreatic duct dilated 02/26/13  . Schatzki's ring   . Skin cancer   . Sliding hiatal hernia   . Vertigo     Patient Active Problem List   Diagnosis Date Noted  . Cellulitis 12/10/2016  . Facial cellulitis   . Erroneous encounter - disregard 03/17/2015  . AKI (acute kidney injury) ([place])   . Metabolic acidosis 23/76/2831  . Acute renal failure syndrome ([place])   . Renal failure 08/22/2014  . Hyperkalemia 08/22/2014  . UTI (urinary tract infection) 08/22/2014  . Acute renal failure ([place]) 08/22/2014  . Dyspnea 03/15/2014  . Diabetes mellitus ([place]) 06/08/2013  . Acute diastolic congestive heart failure ([place]) 06/07/2013  . Syncope 03/26/2013  . Rectal bleeding 03/26/2013  . Dehydration 03/26/2013  . Generalized weakness 03/26/2013  . Malnutrition of moderate degree ([place]) 03/01/2013  .  Diarrhea 02/28/2013  . Loss of weight 02/28/2013  . Acute duodenal ulcer with hemorrhage, without mention of obstruction 02/27/2013  . GIB (gastrointestinal bleeding) 02/26/2013  . Acute blood loss anemia 02/26/2013  . UTI (lower urinary tract infection) 02/26/2013  . Hypertension 02/26/2013    Past Surgical History:  Procedure Laterality Date  . ESOPHAGOGASTRODUODENOSCOPY N/A 02/27/2013   Procedure: ESOPHAGOGASTRODUODENOSCOPY (EGD);  Surgeon: Ladene Artist, MD;  Location: [facility] ENDOSCOPY;  Service: Endoscopy;  Laterality: N/A;  . ESOPHAGOGASTRODUODENOSCOPY N/A 03/28/2013   Procedure: ESOPHAGOGASTRODUODENOSCOPY (EGD);  Surgeon: Rogene Houston, MD;  Location: [facility] ENDO SUITE;  Service: Endoscopy;  Laterality: N/A;  . HOT HEMOSTASIS N/A 02/27/2013   Procedure: HOT HEMOSTASIS (ARGON PLASMA COAGULATION/BICAP);  Surgeon: Ladene Artist, MD;  Location: [facility] ENDOSCOPY;  Service: Endoscopy;  Laterality: N/A;  . NOSE SURGERY     for skin cancer    Current Outpatient Rx  . Order #: [id number] Class: Historical Med  . Order #: [id number] Class: Historical Med  . Order #: [id number] Class: Historical Med  . Order #: [id number] Class: Historical Med  . Order #: [id number] Class: Historical Med  . Order #: [id number] Class: Historical Med  . Order #: [id number] Class: Historical Med  . Order #: [id number] Class: Historical Med    Allergies Clonazepam; Codeine; and Sulfa antibiotics  Family History  Problem Relation Age of Onset  . Heart attack Father  Social History Social History   Tobacco Use  . Smoking status: Never Smoker  . Smokeless tobacco: Never Used  Substance Use Topics  . Alcohol use: No    Alcohol/week: 0.0 oz  . Drug use: No    Review of Systems  Constitutional: No fever/chills. Positive generalized weakness.  Eyes: No visual changes. ENT: No sore throat. Cardiovascular: Denies chest pain. Respiratory: Denies shortness of breath. Gastrointestinal: No abdominal pain.  No  nausea, no vomiting.  No diarrhea.  No constipation. Genitourinary: Negative for dysuria. Positive vaginal bleeding.  Musculoskeletal: Negative for back pain. Skin: Negative for rash. Neurological: Negative for headaches, focal weakness or numbness.  10-point ROS otherwise negative.  ____________________________________________   PHYSICAL EXAM:  VITAL SIGNS: ED Triage Vitals [07/10/17 1618]  Enc Vitals Group     BP (!) 181/76     Pulse Rate 67     Resp 20     Temp 98.2 F (36.8 C)     Temp Source Oral     SpO2 100 %     Weight 118 lb (53.5 kg)     Height 5\' 6"  (1.676 m)     Pain Score 0   Constitutional: Alert and oriented. Well appearing and in no acute distress. Eyes: Conjunctivae are normal. Head: Atraumatic. Nose: No congestion/rhinnorhea. Mouth/Throat: Mucous membranes are moist. Neck: No stridor.  Cardiovascular: Normal rate, regular rhythm. Good peripheral circulation. Grossly normal heart sounds.   Respiratory: Normal respiratory effort.  No retractions. Lungs CTAB. Gastrointestinal: Soft and nontender. No distention.  Genitourinary: Exam performed with a nurse chaperone. No vaginal bleeding appreciated. Moderate discharge. Rectal exam with palpable internal hemorrhoids and BRB on exam finger. No external hemorrhoids or fissures. No rectal or vaginal prolapse clinically but pelvic floor is weak.  Musculoskeletal: No lower extremity tenderness nor edema. No gross deformities of extremities. Neurologic:  Normal speech and language. No gross focal neurologic deficits are appreciated.  Skin:  Skin is warm, dry and intact. No rash noted.  ____________________________________________   LABS (all labs ordered are listed, but only abnormal results are displayed)  Labs Reviewed  WET PREP, GENITAL - Abnormal; Notable for the following components:      Result Value   Clue Cells Wet Prep HPF POC PRESENT (*)    WBC, Wet Prep HPF POC MANY (*)    All other components  within normal limits  CBC WITH DIFFERENTIAL/PLATELET - Abnormal; Notable for the following components:   RBC 3.71 (*)    Hemoglobin 10.6 (*)    HCT 33.0 (*)    All other components within normal limits  BASIC METABOLIC PANEL - Abnormal; Notable for the following components:   CO2 21 (*)    GFR calc non Af Amer 57 (*)    All other components within normal limits  URINALYSIS, ROUTINE W REFLEX MICROSCOPIC - Abnormal; Notable for the following components:   Color, Urine STRAW (*)    Specific Gravity, Urine 1.002 (*)    All other components within normal limits  POC OCCULT BLOOD, ED - Abnormal; Notable for the following components:   Fecal Occult Bld POSITIVE (*)    All other components within normal limits  PROTIME-INR   ____________________________________________  RADIOLOGY  Ct Abdomen Pelvis W Contrast  Result Date: 07/10/2017 CLINICAL DATA:  Heavy vaginal bleeding x1 week. EXAM: CT ABDOMEN AND PELVIS WITH CONTRAST TECHNIQUE: Multidetector CT imaging of the abdomen and pelvis was performed using the standard protocol following bolus administration of intravenous contrast. CONTRAST:  130mL ISOVUE-300 IOPAMIDOL (ISOVUE-300) INJECTION 61% COMPARISON:  12/02/2013 FINDINGS: Lower chest: Top-normal size heart without pericardial effusion. Scarring and/or atelectasis in the lingula. No effusion or pneumothorax. Hepatobiliary: No focal liver abnormality is seen. Mild intra and extrahepatic ductal dilatation, stable in appearance without choledocholithiasis. No gallstones or secondary signs of acute cholecystitis. Pancreas: Atrophic pancreas without inflammation or mass. Spleen: Stable hypervascular lesions in the inferior pole of the spleen measuring approximately 6 mm in diameter, stable in appearance and likely representing a benign finding such as hemangiomas or hemopoietic rests. Adrenals/Urinary Tract: Normal bilateral adrenal glands. Water attenuating cyst off the upper pole of the right kidney  measuring 22 mm, unchanged in appearance. Punctate nonobstructing right lower pole renal calculus. Chronic stable ureterectasis and extrarenal pelvis on the right. Decompressed urinary bladder with posterior inferior bladder wall thickening as before. No discrete enhancing bladder lesions. Stomach/Bowel: Contrast distention of the stomach normal small bowel rotation. No bowel obstruction or inflammation. Normal appendix. Average amount of fecal retention within the colon without inflammation. Vascular/Lymphatic: Moderate aortoiliac atherosclerosis. No lymphadenopathy. Reproductive: Pelvic floor relaxation with mild prolapse the vagina and rectum. Other: No free air nor free fluid. Musculoskeletal: Stable lumbar levoscoliosis with degenerative disc and facet arthropathy of the lumbar spine. IMPRESSION: 1. Pelvic floor relaxation with what appears to be mild prolapse of the rectum and vaginal canal. 2. Chronic stable findings otherwise. Electronically Signed   By: Ashley Royalty M.D.   On: 07/10/2017 23:21    ____________________________________________   PROCEDURES  Procedure(s) performed:   Procedures  None ____________________________________________   INITIAL IMPRESSION / ASSESSMENT AND PLAN / ED COURSE  Pertinent labs & imaging results that were available during my care of the patient were reviewed by me and considered in my medical decision making (see chart for details).  Patient presents emergency department for evaluation of 1 week of vaginal bleeding that is painless. Hb today is 10.6 which is unchanged over the last 6 months.  On pelvic exam the patient does not have any appreciable vaginal bleeding.  She does have bright red blood per rectum and a mass appreciated just inside the rectum that I believe is an internal hemorrhoid although cannot get complete visualization of this area. No rectal or vaginal prolapse clinically. CT scan ordered and pending.  She is hemodynamically stable and  without pain.  Urinalysis reviewed and normal.   CT reviewed. Patient with baseline Hb for the last 2 years. She has seen Dr. Laural Golden in the past for GI bleeding. Advised that she call tomorrow and schedule an outpatient appointment. No melena on exam. No reason to suspect upper GI bleeding. No abdominal/rectal tenderness. Also provided contact info for the OB who she was referred to in August 2018 after evaluation of her bladder prolapse at that time.   At this time, I do not feel there is any life-threatening condition present. I have reviewed and discussed all results (EKG, imaging, lab, urine as appropriate), exam findings with patient. I have reviewed nursing notes and appropriate previous records.  I feel the patient is safe to be discharged home without further emergent workup. Discussed usual and customary return precautions. Patient and family (if present) verbalize understanding and are comfortable with this plan.  Patient will follow-up with their primary care provider. If they do not have a primary care provider, information for follow-up has been provided to them. All questions have been answered.    ____________________________________________  FINAL CLINICAL IMPRESSION(S) / ED DIAGNOSES  Final diagnoses:  Rectal  bleeding  Pelvic floor weakness in female     MEDICATIONS GIVEN DURING THIS VISIT:  Medications  sodium chloride 0.9 % bolus 500 mL (0 mLs Intravenous Stopped 07/10/17 2259)  iopamidol (ISOVUE-300) 61 % injection 100 mL (100 mLs Intravenous Contrast Given 07/10/17 2253)    Note:  This document was prepared using Dragon voice recognition software and may include unintentional dictation errors.  Nanda Quinton, MD Emergency Medicine    Long, [facility], MD 07/11/17 [phone]

## 2017-07-17 DIAGNOSIS — D638 Anemia in other chronic diseases classified elsewhere: Secondary | ICD-10-CM | POA: Diagnosis not present

## 2017-07-17 DIAGNOSIS — I10 Essential (primary) hypertension: Secondary | ICD-10-CM | POA: Diagnosis not present

## 2017-07-17 DIAGNOSIS — K922 Gastrointestinal hemorrhage, unspecified: Secondary | ICD-10-CM | POA: Diagnosis not present

## 2017-07-22 ENCOUNTER — Ambulatory Visit (INDEPENDENT_AMBULATORY_CARE_PROVIDER_SITE_OTHER): Payer: Medicare Other | Admitting: Internal Medicine

## 2017-07-22 ENCOUNTER — Encounter (INDEPENDENT_AMBULATORY_CARE_PROVIDER_SITE_OTHER): Payer: Self-pay | Admitting: *Deleted

## 2017-07-22 ENCOUNTER — Telehealth (INDEPENDENT_AMBULATORY_CARE_PROVIDER_SITE_OTHER): Payer: Self-pay | Admitting: *Deleted

## 2017-07-22 ENCOUNTER — Encounter (INDEPENDENT_AMBULATORY_CARE_PROVIDER_SITE_OTHER): Payer: Self-pay | Admitting: Internal Medicine

## 2017-07-22 VITALS — BP 134/90 | HR 60 | Temp 97.4°F | Ht 66.0 in | Wt 113.9 lb

## 2017-07-22 DIAGNOSIS — K625 Hemorrhage of anus and rectum: Secondary | ICD-10-CM | POA: Diagnosis not present

## 2017-07-22 LAB — CBC WITH DIFFERENTIAL/PLATELET
Basophils Absolute: 34 cells/uL (ref 0–200)
Basophils Relative: 0.4 %
Eosinophils Absolute: 244 cells/uL (ref 15–500)
Eosinophils Relative: 2.9 %
HCT: 34 % — ABNORMAL LOW (ref 35.0–45.0)
Hemoglobin: 11.2 g/dL — ABNORMAL LOW (ref 11.7–15.5)
Lymphs Abs: 2041 cells/uL (ref 850–3900)
MCH: 28.8 pg (ref 27.0–33.0)
MCHC: 32.9 g/dL (ref 32.0–36.0)
MCV: 87.4 fL (ref 80.0–100.0)
MPV: 10.5 fL (ref 7.5–12.5)
Monocytes Relative: 8.1 %
Neutro Abs: 5401 cells/uL (ref 1500–7800)
Neutrophils Relative %: 64.3 %
Platelets: 391 10*3/uL (ref 140–400)
RBC: 3.89 10*6/uL (ref 3.80–5.10)
RDW: 14.9 % (ref 11.0–15.0)
Total Lymphocyte: 24.3 %
WBC mixed population: 680 cells/uL (ref 200–950)
WBC: 8.4 10*3/uL (ref 3.8–10.8)

## 2017-07-22 MED ORDER — PEG 3350-KCL-NA BICARB-NACL 420 G PO SOLR: 4000.0000 mL | Freq: Once | ORAL | 0 refills | Status: AC

## 2017-07-22 NOTE — Patient Instructions (Signed)
Colonoscopy. The risks of bleeding, perforation and infection were reviewed with patient.  

## 2017-07-22 NOTE — Progress Notes (Addendum)
Subjective:    Patient ID: Renee Blake, female    DOB: 09-16-1928, 84 y.o.   MRN: [id number]  HPI Presents today with c/o rectal bleeding 2 weeks ago.  She says the blood was bright red. She was seen at [facility] for the rectal bleeding (07/10/2017).  On exam in the ED she was guaiac positive. There really has not been any weight loss. Appetite is okay. She has a BM every 3 times a week. She occasionally has diarrhea. No family hx of colon cancer.   Hx of UGI bleed from [facility].  She takes an ASA daily.    CBC    Component Value Date/Time   WBC 7.2 07/10/2017 1652   RBC 3.71 (L) 07/10/2017 1652   HGB 10.6 (L) 07/10/2017 1652   HCT 33.0 (L) 07/10/2017 1652   PLT 312 07/10/2017 1652   MCV 88.9 07/10/2017 1652   MCH 28.6 07/10/2017 1652   MCHC 32.1 07/10/2017 1652   RDW 14.8 07/10/2017 1652   LYMPHSABS 1.7 07/10/2017 1652   MONOABS 0.5 07/10/2017 1652   EOSABS 0.2 07/10/2017 1652   BASOSABS 0.0 07/10/2017 1652     Underwent an EGD 2014 by Dr. Laural Golden which revealed  Her last colonoscopy was in 2014 by Dr. Britta Mccreedy which was normal.   03/26/2013 EGD Dr. Rehman:Indications: Patient is an 84 year old Caucasian female who presents with history of melena and weakness. She was hospitalized at [facility] one month ago for 2 unit upper GI bleed secondary to bleeding from duodenal ulcer treated endoscopically. H. pylori serology was negative. She has history of taking Goody powder but denies use over the last 4 weeks. Patient's hemoglobin has dropped by almost 3 g since admission. She is undergoing diagnostic and possibly therapeutic EGD.  Impression:  Small sliding hiatal hernia with noncritical Schatzki's ring which was not manipulated.  Distal bulbar ulcer has healed at least 90% and no stigmata of bleed noted.  Duodenal stricture at angle of the duodenum allowing passage of scope distally. 02/27/2013 EGD: Dr. Fuller Plan.  1. Duodenal ulcer, actively bleeding; Bipolar (BICAP) cautery and  epi  injection; complete hemostasis achieved  2. Duodenal bulb stenosis and deformity  3. Stricture at the gastroesophageal junction  4. Small hiatal hernia    Review of Systems     Past Medical History:  Diagnosis Date  . Acute blood loss anemia   . Chronic diarrhea   . Dilation of biliary tract    biliary duct per 02/26/13 CT  . Duodenal stricture   . Esophageal stricture   . Gastric ulcer   . GI bleed   . Hyperlipemia   . Hypertension   . Microscopic colitis   . Nephrolithiasis   . Pancreatic duct dilated 02/26/13  . Schatzki's ring   . Skin cancer   . Sliding hiatal hernia   . Vertigo     Past Surgical History:  Procedure Laterality Date  . ESOPHAGOGASTRODUODENOSCOPY N/A 02/27/2013   Procedure: ESOPHAGOGASTRODUODENOSCOPY (EGD);  Surgeon: Ladene Artist, MD;  Location: [facility] ENDOSCOPY;  Service: Endoscopy;  Laterality: N/A;  . ESOPHAGOGASTRODUODENOSCOPY N/A 03/28/2013   Procedure: ESOPHAGOGASTRODUODENOSCOPY (EGD);  Surgeon: Rogene Houston, MD;  Location: [facility] ENDO SUITE;  Service: Endoscopy;  Laterality: N/A;  . HOT HEMOSTASIS N/A 02/27/2013   Procedure: HOT HEMOSTASIS (ARGON PLASMA COAGULATION/BICAP);  Surgeon: Ladene Artist, MD;  Location: [facility] ENDOSCOPY;  Service: Endoscopy;  Laterality: N/A;  . NOSE SURGERY     for skin cancer    Allergies  Allergen Reactions  . Clonazepam Other (See Comments)    Pruritis  . Codeine Nausea And Vomiting and Other (See Comments)    Reaction unknown  . Sulfa Antibiotics Other (See Comments)    Reactions unknown    Current Outpatient Medications on File Prior to Visit  Medication Sig Dispense Refill  . ALPRAZolam (XANAX) 0.25 MG tablet Take 0.25 mg by mouth 2 (two) times daily as needed. for anxiety  0  . amLODipine (NORVASC) 2.5 MG tablet Take 2.5 mg by mouth daily.    Marland Kitchen aspirin EC 81 MG tablet Take 81 mg by mouth daily.    Marland Kitchen losartan (COZAAR) 50 MG tablet Take 50 mg by mouth 2 (two) times daily.     . pantoprazole (PROTONIX) 40  MG tablet Take 40 mg by mouth every morning.     . potassium chloride (MICRO-K) 10 MEQ CR capsule Take 10 mEq by mouth 2 (two) times daily.     No current facility-administered medications on file prior to visit.      Objective:   Physical Exam Blood pressure 134/90, pulse 60, temperature (!) 97.4 F (36.3 C), height 5\' 6"  (1.676 m), weight 113 lb 14.4 oz (51.7 kg). Alert and oriented. Skin warm and dry. Oral mucosa is moist.   . Sclera anicteric, conjunctivae is pink. Thyroid not enlarged. No cervical lymphadenopathy. Lungs clear. Heart regular rate and rhythm.  Abdomen is soft. Bowel sounds are positive. No hepatomegaly. No abdominal masses felt. No tenderness.  No edema to lower extremities.         Assessment & Plan:  Rectal bleeding. Colonic neoplasm needs to be ruled. Patient and granddaughter would like to proceed with a colonoscopy. The risks of bleeding, perforation and infection were reviewed with patient.

## 2017-07-22 NOTE — Telephone Encounter (Signed)
Patient needs trilyte 

## 2017-07-25 ENCOUNTER — Telehealth (INDEPENDENT_AMBULATORY_CARE_PROVIDER_SITE_OTHER): Payer: Self-pay | Admitting: Internal Medicine

## 2017-07-25 ENCOUNTER — Encounter (HOSPITAL_COMMUNITY): Payer: Self-pay | Admitting: Emergency Medicine

## 2017-07-25 ENCOUNTER — Encounter (INDEPENDENT_AMBULATORY_CARE_PROVIDER_SITE_OTHER): Payer: Self-pay | Admitting: *Deleted

## 2017-07-25 ENCOUNTER — Inpatient Hospital Stay (HOSPITAL_COMMUNITY)
Admission: EM | Admit: 2017-07-25 | Discharge: 2017-07-30 | DRG: 375 | Disposition: A | Discharge status: Other Acute Inpatient Hospital | Payer: Medicare Other | Attending: Family Medicine | Admitting: Family Medicine

## 2017-07-25 DIAGNOSIS — K219 Gastro-esophageal reflux disease without esophagitis: Secondary | ICD-10-CM | POA: Diagnosis not present

## 2017-07-25 DIAGNOSIS — Z7982 Long term (current) use of aspirin: Secondary | ICD-10-CM | POA: Diagnosis not present

## 2017-07-25 DIAGNOSIS — C4492 Squamous cell carcinoma of skin, unspecified: Secondary | ICD-10-CM | POA: Diagnosis not present

## 2017-07-25 DIAGNOSIS — E785 Hyperlipidemia, unspecified: Secondary | ICD-10-CM | POA: Diagnosis present

## 2017-07-25 DIAGNOSIS — K626 Ulcer of anus and rectum: Secondary | ICD-10-CM | POA: Diagnosis not present

## 2017-07-25 DIAGNOSIS — N950 Postmenopausal bleeding: Secondary | ICD-10-CM | POA: Diagnosis not present

## 2017-07-25 DIAGNOSIS — I10 Essential (primary) hypertension: Secondary | ICD-10-CM | POA: Diagnosis present

## 2017-07-25 DIAGNOSIS — D125 Benign neoplasm of sigmoid colon: Secondary | ICD-10-CM | POA: Diagnosis not present

## 2017-07-25 DIAGNOSIS — I110 Hypertensive heart disease with heart failure: Secondary | ICD-10-CM | POA: Diagnosis not present

## 2017-07-25 DIAGNOSIS — D62 Acute posthemorrhagic anemia: Secondary | ICD-10-CM | POA: Diagnosis not present

## 2017-07-25 DIAGNOSIS — E872 Acidosis, unspecified: Secondary | ICD-10-CM | POA: Diagnosis present

## 2017-07-25 DIAGNOSIS — Z85828 Personal history of other malignant neoplasm of skin: Secondary | ICD-10-CM

## 2017-07-25 DIAGNOSIS — R2991 Unspecified symptoms and signs involving the musculoskeletal system: Secondary | ICD-10-CM | POA: Diagnosis not present

## 2017-07-25 DIAGNOSIS — R195 Other fecal abnormalities: Secondary | ICD-10-CM | POA: Diagnosis not present

## 2017-07-25 DIAGNOSIS — C801 Malignant (primary) neoplasm, unspecified: Secondary | ICD-10-CM | POA: Diagnosis not present

## 2017-07-25 DIAGNOSIS — K635 Polyp of colon: Secondary | ICD-10-CM | POA: Diagnosis not present

## 2017-07-25 DIAGNOSIS — E119 Type 2 diabetes mellitus without complications: Secondary | ICD-10-CM | POA: Diagnosis not present

## 2017-07-25 DIAGNOSIS — C20 Malignant neoplasm of rectum: Secondary | ICD-10-CM | POA: Diagnosis not present

## 2017-07-25 DIAGNOSIS — K5140 Inflammatory polyps of colon without complications: Secondary | ICD-10-CM | POA: Diagnosis not present

## 2017-07-25 DIAGNOSIS — C785 Secondary malignant neoplasm of large intestine and rectum: Secondary | ICD-10-CM | POA: Diagnosis not present

## 2017-07-25 DIAGNOSIS — E440 Moderate protein-calorie malnutrition: Secondary | ICD-10-CM | POA: Diagnosis not present

## 2017-07-25 DIAGNOSIS — R87613 High grade squamous intraepithelial lesion on cytologic smear of cervix (HGSIL): Secondary | ICD-10-CM | POA: Diagnosis not present

## 2017-07-25 DIAGNOSIS — K922 Gastrointestinal hemorrhage, unspecified: Secondary | ICD-10-CM

## 2017-07-25 DIAGNOSIS — R531 Weakness: Secondary | ICD-10-CM | POA: Diagnosis not present

## 2017-07-25 DIAGNOSIS — I5032 Chronic diastolic (congestive) heart failure: Secondary | ICD-10-CM | POA: Diagnosis not present

## 2017-07-25 DIAGNOSIS — M6250 Muscle wasting and atrophy, not elsewhere classified, unspecified site: Secondary | ICD-10-CM | POA: Diagnosis not present

## 2017-07-25 DIAGNOSIS — C7982 Secondary malignant neoplasm of genital organs: Secondary | ICD-10-CM | POA: Diagnosis not present

## 2017-07-25 DIAGNOSIS — Z882 Allergy status to sulfonamides status: Secondary | ICD-10-CM | POA: Diagnosis not present

## 2017-07-25 DIAGNOSIS — Z8711 Personal history of peptic ulcer disease: Secondary | ICD-10-CM | POA: Diagnosis not present

## 2017-07-25 DIAGNOSIS — N898 Other specified noninflammatory disorders of vagina: Secondary | ICD-10-CM | POA: Diagnosis present

## 2017-07-25 DIAGNOSIS — N899 Noninflammatory disorder of vagina, unspecified: Secondary | ICD-10-CM | POA: Diagnosis present

## 2017-07-25 DIAGNOSIS — K625 Hemorrhage of anus and rectum: Secondary | ICD-10-CM | POA: Diagnosis present

## 2017-07-25 DIAGNOSIS — K6289 Other specified diseases of anus and rectum: Secondary | ICD-10-CM | POA: Diagnosis not present

## 2017-07-25 DIAGNOSIS — D098 Carcinoma in situ of other specified sites: Secondary | ICD-10-CM | POA: Diagnosis present

## 2017-07-25 LAB — COMPREHENSIVE METABOLIC PANEL
ALT: 13 U/L — ABNORMAL LOW (ref 14–54)
AST: 15 U/L (ref 15–41)
Albumin: 3.7 g/dL (ref 3.5–5.0)
Alkaline Phosphatase: 93 U/L (ref 38–126)
Anion gap: 10 (ref 5–15)
BUN: 16 mg/dL (ref 6–20)
CO2: 20 mmol/L — ABNORMAL LOW (ref 22–32)
Calcium: 9.5 mg/dL (ref 8.9–10.3)
Chloride: 109 mmol/L (ref 101–111)
Creatinine, Ser: 0.84 mg/dL (ref 0.44–1.00)
GFR calc Af Amer: >60 mL/min (ref >60)
GFR calc non Af Amer: >60 mL/min (ref >60)
Glucose, Bld: 106 mg/dL — ABNORMAL HIGH (ref 65–99)
Potassium: 4.8 mmol/L (ref 3.5–5.1)
Sodium: 139 mmol/L (ref 135–145)
Total Bilirubin: 0.4 mg/dL (ref 0.3–1.2)
Total Protein: 7.3 g/dL (ref 6.5–8.1)

## 2017-07-25 LAB — CBC
HCT: 32.1 % — ABNORMAL LOW (ref 36.0–46.0)
Hemoglobin: 10.1 g/dL — ABNORMAL LOW (ref 12.0–15.0)
MCH: 28.3 pg (ref 26.0–34.0)
MCHC: 31.5 g/dL (ref 30.0–36.0)
MCV: 89.9 fL (ref 78.0–100.0)
Platelets: 321 10*3/uL (ref 150–400)
RBC: 3.57 MIL/uL — ABNORMAL LOW (ref 3.87–5.11)
RDW: 14.7 % (ref 11.5–15.5)
WBC: 7.7 10*3/uL (ref 4.0–10.5)

## 2017-07-25 LAB — TYPE AND SCREEN
ABO/RH(D): O NEG
Antibody Screen: NEGATIVE

## 2017-07-25 LAB — POC OCCULT BLOOD, ED: Fecal Occult Bld: POSITIVE — AB

## 2017-07-25 MED ORDER — ACETAMINOPHEN 650 MG RE SUPP: 650.0000 mg | Freq: Four times a day (QID) | RECTAL | Status: DC | PRN

## 2017-07-25 MED ORDER — ONDANSETRON HCL 4 MG/2ML IJ SOLN: 4.0000 mg | Freq: Four times a day (QID) | INTRAMUSCULAR | Status: DC | PRN

## 2017-07-25 MED ORDER — SODIUM CHLORIDE 0.9 % IV SOLN: 250.0000 mL | INTRAVENOUS | Status: DC | PRN

## 2017-07-25 MED ORDER — LOSARTAN POTASSIUM 50 MG PO TABS
50.0000 mg | ORAL_TABLET | Freq: Two times a day (BID) | ORAL | Status: DC
  Administered 2017-07-25 – 2017-07-30 (×9): 50 mg via ORAL
  Filled 2017-07-25 (×9): qty 1

## 2017-07-25 MED ORDER — SODIUM CHLORIDE 0.9 % IV BOLUS
500.0000 mL | Freq: Once | INTRAVENOUS | Status: AC
  Administered 2017-07-25: 500 mL via INTRAVENOUS

## 2017-07-25 MED ORDER — SODIUM CHLORIDE 0.9% FLUSH
3.0000 mL | Freq: Two times a day (BID) | INTRAVENOUS | Status: DC
  Administered 2017-07-25 – 2017-07-30 (×10): 3 mL via INTRAVENOUS

## 2017-07-25 MED ORDER — PANTOPRAZOLE SODIUM 40 MG PO TBEC
40.0000 mg | DELAYED_RELEASE_TABLET | Freq: Every morning | ORAL | Status: DC
  Administered 2017-07-26 – 2017-07-30 (×4): 40 mg via ORAL
  Filled 2017-07-25 (×4): qty 1

## 2017-07-25 MED ORDER — SODIUM CHLORIDE 0.9% FLUSH: 3.0000 mL | INTRAVENOUS | Status: DC | PRN

## 2017-07-25 MED ORDER — ALPRAZOLAM 0.25 MG PO TABS
0.2500 mg | ORAL_TABLET | Freq: Two times a day (BID) | ORAL | Status: DC | PRN
  Administered 2017-07-25 – 2017-07-30 (×5): 0.25 mg via ORAL
  Filled 2017-07-25 (×5): qty 1

## 2017-07-25 MED ORDER — AMLODIPINE BESYLATE 5 MG PO TABS
2.5000 mg | ORAL_TABLET | Freq: Every day | ORAL | Status: DC
  Administered 2017-07-26 – 2017-07-30 (×4): 2.5 mg via ORAL
  Filled 2017-07-25 (×4): qty 1

## 2017-07-25 MED ORDER — ONDANSETRON HCL 4 MG PO TABS: 4.0000 mg | ORAL_TABLET | Freq: Four times a day (QID) | ORAL | Status: DC | PRN

## 2017-07-25 MED ORDER — ACETAMINOPHEN 325 MG PO TABS
650.0000 mg | ORAL_TABLET | Freq: Four times a day (QID) | ORAL | Status: DC | PRN
  Administered 2017-07-25 – 2017-07-30 (×4): 650 mg via ORAL
  Filled 2017-07-25 (×4): qty 2

## 2017-07-25 NOTE — H&P (Signed)
History and Physical  MARYLYNN RIGDON XHB:[id number] DOB: September 30, 1928 DOA: 07/25/2017  Referring physician: Lacinda Axon PCP: [name], MD   Chief Complaint: rectal bleeding  HPI: Renee Blake is a 84 y.o. female with past medical history detailed below has been dealing with rectal bleeding for approximately the last 4 weeks.  She reports that she has been seeing bright red blood in the rectum.  She says that she occasionally does have loose stools.  She had seen GI recently and they had made some outpatient planning for colonoscopy in the next several weeks.  She denies weight loss.  The patient reports that last night the bleeding became worse.  She does complain of generalized weakness.  Her hemoglobin has dropped from 11-10.  She denies abdominal pain.  She denies fever and chills.  She denies shortness of breath and chest pain.    ED course: The patient was seen in the ED and noted to be Hemoccult positive.  Her hemoglobin was noted to be 10.  GI was consulted and have recommended that she be admitted.  She has been started on a clear liquid diet and she will be monitored closely for further bleeding.  Review of Systems: All systems reviewed and apart from history of presenting illness, are negative.  Past Medical History:  Diagnosis Date  . Acute blood loss anemia   . Chronic diarrhea   . Dilation of biliary tract    biliary duct per 02/26/13 CT  . Duodenal stricture   . Esophageal stricture   . Gastric ulcer   . GI bleed   . Hyperlipemia   . Hypertension   . Microscopic colitis   . Nephrolithiasis   . Pancreatic duct dilated 02/26/13  . Schatzki's ring   . Skin cancer   . Sliding hiatal hernia   . Vertigo    Past Surgical History:  Procedure Laterality Date  . ESOPHAGOGASTRODUODENOSCOPY N/A 02/27/2013   Procedure: ESOPHAGOGASTRODUODENOSCOPY (EGD);  Surgeon: Ladene Artist, MD;  Location: [facility] ENDOSCOPY;  Service: Endoscopy;  Laterality: N/A;  .  ESOPHAGOGASTRODUODENOSCOPY N/A 03/28/2013   Procedure: ESOPHAGOGASTRODUODENOSCOPY (EGD);  Surgeon: Rogene Houston, MD;  Location: [facility] ENDO SUITE;  Service: Endoscopy;  Laterality: N/A;  . HOT HEMOSTASIS N/A 02/27/2013   Procedure: HOT HEMOSTASIS (ARGON PLASMA COAGULATION/BICAP);  Surgeon: Ladene Artist, MD;  Location: [facility] ENDOSCOPY;  Service: Endoscopy;  Laterality: N/A;  . NOSE SURGERY     for skin cancer   Social History:  reports that she has never smoked. She has never used smokeless tobacco. She reports that she does not drink alcohol or use drugs.  Allergies  Allergen Reactions  . Clonazepam Other (See Comments)    Pruritis  . Codeine Nausea And Vomiting and Other (See Comments)    Reaction unknown  . Sulfa Antibiotics Other (See Comments)    Reactions unknown    Family History  Problem Relation Age of Onset  . Heart attack Father     Prior to Admission medications   Medication Sig Start Date End Date Taking? Authorizing Provider  ALPRAZolam (XANAX) 0.25 MG tablet Take 0.25 mg by mouth 2 (two) times daily as needed. for anxiety 02/17/15  Yes [provider]  amLODipine (NORVASC) 2.5 MG tablet Take 2.5 mg by mouth daily.   Yes [provider]  aspirin EC 81 MG tablet Take 81 mg by mouth daily.   Yes [provider]  losartan (COZAAR) 50 MG tablet Take 50 mg by mouth  2 (two) times daily.    Yes [provider]  meclizine (ANTIVERT) 25 MG tablet Take by mouth. 07/11/17  Yes [provider]  pantoprazole (PROTONIX) 40 MG tablet Take 40 mg by mouth every morning.    Yes [provider]  potassium chloride (MICRO-K) 10 MEQ CR capsule Take 10 mEq by mouth 2 (two) times daily.   Yes [provider]  spironolactone (ALDACTONE) 25 MG tablet Take by mouth. 07/11/17  Yes [provider]  TRAVEL SICKNESS 25 MG CHEW Chew 1 tablet by mouth 3 (three) times daily as needed. 07/11/17   [provider]   Physical  Exam: Vitals:   07/25/17 1230 07/25/17 1305 07/25/17 1330 07/25/17 1400  BP: (!) 159/68 (!) 159/68 138/70 (!) 155/66  Pulse: 85 100 70 76  Resp: 14 14 10 15   Temp:      TempSrc:      SpO2: 100% 100% 99% 100%  Weight:      Height:        General exam: Moderately built and thin patient, alert, cooperative patient, lying comfortably supine on the gurney in no obvious distress.  Head, eyes and ENT: Nontraumatic and normocephalic. Pupils equally reacting to light and accommodation. Oral mucosa pale, moist.  Neck: Supple. No JVD, carotid bruit or thyromegaly.  Lymphatics: No lymphadenopathy.  Respiratory system: Clear to auscultation. No increased work of breathing.  Cardiovascular system: S1 and S2 heard.   Gastrointestinal system: Abdomen is nondistended, soft and nontender. Normal bowel sounds heard. No organomegaly or masses appreciated.  Central nervous system: Alert and oriented. No focal neurological deficits.  Extremities: Symmetric 5 x 5 power. Peripheral pulses symmetrically felt.   Skin: No rashes or acute findings.  Musculoskeletal system: Negative exam.  Psychiatry: Pleasant and cooperative.  Labs on Admission:  Basic Metabolic Panel: Recent Labs  Lab 07/25/17 1054  NA 139  K 4.8  CL 109  CO2 20*  GLUCOSE 106*  BUN 16  CREATININE 0.84  CALCIUM 9.5   Liver Function Tests: Recent Labs  Lab 07/25/17 1054  AST 15  ALT 13*  ALKPHOS 93  BILITOT 0.4  PROT 7.3  ALBUMIN 3.7   No results for input(s): LIPASE, AMYLASE in the last 168 hours. No results for input(s): AMMONIA in the last 168 hours. CBC: Recent Labs  Lab 07/22/17 1129 07/25/17 1054  WBC 8.4 7.7  NEUTROABS 5,401  --   HGB 11.2* 10.1*  HCT 34.0* 32.1*  MCV 87.4 89.9  PLT 391 321   Cardiac Enzymes: No results for input(s): CKTOTAL, CKMB, CKMBINDEX, TROPONINI in the last 168 hours.  BNP (last 3 results) No results for input(s): PROBNP in the last 8760 hours. CBG: No results for  input(s): GLUCAP in the last 168 hours.  Radiological Exams on Admission: No results found.  EKG: Independently reviewed.   Assessment/Plan Principal Problem:   Rectal bleeding Active Problems:   GIB (gastrointestinal bleeding)   Acute blood loss anemia   Hypertension   Malnutrition of moderate degree (HCC)   Generalized weakness   Diabetes mellitus (HCC)   Metabolic acidosis   Heme positive stool  1. Prolonged rectal bleeding - The patient reports that her symptoms have been ongoing for the past 4 weeks.  She continues to have bright red blood in the rectum and is Hemoccult positive.  She will be admitted to a MedSurg bed.  Monitor hemoglobin.  Clear liquid diet.  GI has been consulted and will see her in the morning.  Repeat CBC in the morning. 2. Acute blood loss anemia-type and screen.  Follow hemoglobin and repeat CBC in the morning. 3. Essential hypertension-plan on repeat resuming home medications and following. 4. Generalized weakness-likely secondary to rectal bleeding and acute blood loss anemia.  DVT Prophylaxis: SCDs Code Status: Full   Family Communication: bedside  Disposition Plan: pending specialty consultation and plan, medical monitoring and medical stabilization   Time spent: 65 mins  Irwin Brakeman, MD [facility] Pager [phone]  If 7PM-7AM, please contact night-coverage [email or web address] Password [facility] 07/25/2017, 2:46 PM

## 2017-07-25 NOTE — ED Provider Notes (Addendum)
[facility] EMERGENCY DEPARTMENT Provider Note   CSN: [id number] Arrival date & time: 07/25/17  1004     History   Chief Complaint Chief Complaint  Patient presents with  . Rectal Bleeding    HPI Renee Blake is a 84 y.o. female.  Second emergency visit since 07/10/2017 for rectal bleeding.  Patient stated blood was in the toilet water and a significant amount.  Symptoms restarted last night.  She was seen in the office this week by gastroenterology and a colonoscopy was scheduled for May 29.  She feels weak.  No known lower GI pathology.  Severity of symptoms is moderate.  Nothing makes symptoms better or worse.  Hemoglobin has dropped from 11.2-10.1 recently.     Past Medical History:  Diagnosis Date  . Acute blood loss anemia   . Chronic diarrhea   . Dilation of biliary tract    biliary duct per 02/26/13 CT  . Duodenal stricture   . Esophageal stricture   . Gastric ulcer   . GI bleed   . Hyperlipemia   . Hypertension   . Microscopic colitis   . Nephrolithiasis   . Pancreatic duct dilated 02/26/13  . Schatzki's ring   . Skin cancer   . Sliding hiatal hernia   . Vertigo     Patient Active Problem List   Diagnosis Date Noted  . Cellulitis 12/10/2016  . Facial cellulitis   . Erroneous encounter - disregard 03/17/2015  . AKI (acute kidney injury) ([place])   . Metabolic acidosis 17/51/0258  . Acute renal failure syndrome ([place])   . Renal failure 08/22/2014  . Hyperkalemia 08/22/2014  . UTI (urinary tract infection) 08/22/2014  . Acute renal failure ([place]) 08/22/2014  . Dyspnea 03/15/2014  . Diabetes mellitus ([place]) 06/08/2013  . Acute diastolic congestive heart failure ([place]) 06/07/2013  . Syncope 03/26/2013  . Rectal bleeding 03/26/2013  . Dehydration 03/26/2013  . Generalized weakness 03/26/2013  . Malnutrition of moderate degree ([place]) 03/01/2013  . Diarrhea 02/28/2013  . Loss of weight 02/28/2013  . Acute duodenal ulcer with hemorrhage, without mention  of obstruction 02/27/2013  . GIB (gastrointestinal bleeding) 02/26/2013  . Acute blood loss anemia 02/26/2013  . UTI (lower urinary tract infection) 02/26/2013  . Hypertension 02/26/2013    Past Surgical History:  Procedure Laterality Date  . ESOPHAGOGASTRODUODENOSCOPY N/A 02/27/2013   Procedure: ESOPHAGOGASTRODUODENOSCOPY (EGD);  Surgeon: Ladene Artist, MD;  Location: [facility] ENDOSCOPY;  Service: Endoscopy;  Laterality: N/A;  . ESOPHAGOGASTRODUODENOSCOPY N/A 03/28/2013   Procedure: ESOPHAGOGASTRODUODENOSCOPY (EGD);  Surgeon: Rogene Houston, MD;  Location: [facility] ENDO SUITE;  Service: Endoscopy;  Laterality: N/A;  . HOT HEMOSTASIS N/A 02/27/2013   Procedure: HOT HEMOSTASIS (ARGON PLASMA COAGULATION/BICAP);  Surgeon: Ladene Artist, MD;  Location: [facility] ENDOSCOPY;  Service: Endoscopy;  Laterality: N/A;  . NOSE SURGERY     for skin cancer     OB History    Gravida  6   Para  6   Term  6   Preterm      AB      Living  6     SAB      TAB      Ectopic      Multiple      Live Births               Home Medications    Prior to Admission medications   Medication Sig Start Date End Date Taking? Authorizing Provider  ALPRAZolam Duanne Moron) 0.25 MG  tablet Take 0.25 mg by mouth 2 (two) times daily as needed. for anxiety 02/17/15  Yes [provider]  amLODipine (NORVASC) 2.5 MG tablet Take 2.5 mg by mouth daily.   Yes [provider]  aspirin EC 81 MG tablet Take 81 mg by mouth daily.   Yes [provider]  losartan (COZAAR) 50 MG tablet Take 50 mg by mouth 2 (two) times daily.    Yes [provider]  meclizine (ANTIVERT) 25 MG tablet Take by mouth. 07/11/17  Yes [provider]  pantoprazole (PROTONIX) 40 MG tablet Take 40 mg by mouth every morning.    Yes [provider]  potassium chloride (MICRO-K) 10 MEQ CR capsule Take 10 mEq by mouth 2 (two) times daily.   Yes [provider]  spironolactone (ALDACTONE) 25 MG  tablet Take by mouth. 07/11/17  Yes [provider]  TRAVEL SICKNESS 25 MG CHEW Chew 1 tablet by mouth 3 (three) times daily as needed. 07/11/17   [provider]    Family History Family History  Problem Relation Age of Onset  . Heart attack Father     Social History Social History   Tobacco Use  . Smoking status: Never Smoker  . Smokeless tobacco: Never Used  Substance Use Topics  . Alcohol use: No    Alcohol/week: 0.0 oz  . Drug use: No     Allergies   Clonazepam; Codeine; and Sulfa antibiotics   Review of Systems Review of Systems  All other systems reviewed and are negative.    Physical Exam Updated Vital Signs BP (!) 159/68 (BP Location: Left Arm)   Pulse 100   Temp 97.8 F (36.6 C) (Oral)   Resp 14   Ht 5\' 6"  (1.676 m)   Wt 51.3 kg (113 lb)   SpO2 100%   BMI 18.24 kg/m   Physical Exam  Constitutional: She is oriented to person, place, and time. She appears well-developed and well-nourished.  HENT:  Head: Normocephalic and atraumatic.  Eyes: Conjunctivae are normal.  Neck: Neck supple.  Cardiovascular: Normal rate and regular rhythm.  Pulmonary/Chest: Effort normal and breath sounds normal.  Abdominal: Soft. Bowel sounds are normal.  Nontender  Genitourinary:  Genitourinary Comments: Rectal exam: No masses, green stool, heme positive  Musculoskeletal: Normal range of motion.  Neurological: She is alert and oriented to person, place, and time.  Skin: Skin is warm and dry.  Psychiatric: She has a normal mood and affect. Her behavior is normal.  Nursing note and vitals reviewed.    ED Treatments / Results  Labs (all labs ordered are listed, but only abnormal results are displayed) Labs Reviewed  COMPREHENSIVE METABOLIC PANEL - Abnormal; Notable for the following components:      Result Value   CO2 20 (*)    Glucose, Bld 106 (*)    ALT 13 (*)    All other components within normal limits  CBC - Abnormal; Notable for the  following components:   RBC 3.57 (*)    Hemoglobin 10.1 (*)    HCT 32.1 (*)    All other components within normal limits  POC OCCULT BLOOD, ED - Abnormal; Notable for the following components:   Fecal Occult Bld POSITIVE (*)    All other components within normal limits  TYPE AND SCREEN    EKG None  Radiology No results found.  Procedures Procedures (including critical care time)  Medications Ordered in ED Medications  sodium chloride 0.9 % bolus 500  mL (500 mLs Intravenous New Bag/Given 07/25/17 1258)     Initial Impression / Assessment and Plan / ED Course  I have reviewed the triage vital signs and the nursing notes.  Pertinent labs & imaging results that were available during my care of the patient were reviewed by me and considered in my medical decision making (see chart for details).     Patient presents with persistent lower GI bleeding.  Will consult gastroenterology.  Probable admission.  Final Clinical Impressions(s) / ED Diagnoses   Final diagnoses:  Lower GI bleed    ED Discharge Orders    None       Nat Christen, MD 07/25/17 1343    Nat Christen, MD 07/25/17 [phone]

## 2017-07-25 NOTE — Telephone Encounter (Signed)
Patients daughter called stated patient is pouring blood from rectum and they are on their way to the emergency room...she stated last time they went the doctors told her there wasn't anything they could do and she should contact Dr Laural Golden.

## 2017-07-25 NOTE — Telephone Encounter (Signed)
Dr.Rehman was made aware. 

## 2017-07-25 NOTE — ED Notes (Signed)
Pt ambulating to restroom without issue

## 2017-07-25 NOTE — ED Notes (Signed)
Pt is also complaining of generalized weakness.

## 2017-07-25 NOTE — ED Triage Notes (Signed)
Patient complains of rectal bleed x 4 weeks. Saw Dr. Gwendolyn Grant Tuesday and stated that the bleeding got worse last night. She states bright red blood from rectum.

## 2017-07-26 DIAGNOSIS — K625 Hemorrhage of anus and rectum: Secondary | ICD-10-CM

## 2017-07-26 LAB — CBC
HCT: 30 % — ABNORMAL LOW (ref 36.0–46.0)
Hemoglobin: 9.3 g/dL — ABNORMAL LOW (ref 12.0–15.0)
MCH: 27.9 pg (ref 26.0–34.0)
MCHC: 31 g/dL (ref 30.0–36.0)
MCV: 90.1 fL (ref 78.0–100.0)
Platelets: 300 10*3/uL (ref 150–400)
RBC: 3.33 MIL/uL — ABNORMAL LOW (ref 3.87–5.11)
RDW: 14.7 % (ref 11.5–15.5)
WBC: 5.6 10*3/uL (ref 4.0–10.5)

## 2017-07-26 MED ORDER — PEG 3350-KCL-NA BICARB-NACL 420 G PO SOLR
4000.0000 mL | Freq: Once | ORAL | Status: AC
Start: 2017-07-27 — End: 2017-07-27
  Administered 2017-07-27: 4000 mL via ORAL
  Filled 2017-07-26: qty 4000

## 2017-07-26 NOTE — Progress Notes (Signed)
PROGRESS NOTE    KHYA HALLS  YCX:[id number]  DOB: 05/27/1928  DOA: 07/25/2017 PCP: [name], MD   Brief Admission Hx: Renee Blake is a 84 y.o. female with past medical history detailed below has been dealing with rectal bleeding for approximately the last 4 weeks.  She reports that she has been seeing bright red blood in the rectum.   MDM/Assessment & Plan:    1. Rectal bleeding(BRBPR)  - The patient reports that her symptoms have been ongoing for the past 4 weeks.  She continues to have bright red blood in the rectum and is Hemoccult positive.  GI planning colonoscopy 4/30.  Please see orders and consult notes. Continue supportive therapy.   2. Acute blood loss anemia-type and screened.  Follow hemoglobin. 3. Essential hypertension-resuming home medications and following. 4. Generalized weakness-likely secondary to rectal bleeding and acute blood loss anemia.  DVT Prophylaxis: SCDs Code Status: Full   Family Communication: bedside  Disposition Plan: pending specialty consultation and plan, medical monitoring and medical stabilization    Consultants:  GI   Procedures:  Colonoscopy 4/30   Subjective: Pt says that the bleeding seems to have slowed down.    Objective: Vitals:   07/25/17 1622 07/25/17 2053 07/25/17 2200 07/26/17 0543  BP: (!) 158/66 (!) 177/75 138/65 (!) 122/59  Pulse: 71 75  62  Resp: 20 16  16   Temp: 97.7 F (36.5 C) (!) 97.4 F (36.3 C)  (!) 97.5 F (36.4 C)  TempSrc: Oral Oral  Oral  SpO2: 94% 99%  98%  Weight: 52.6 kg (115 lb 14.4 oz)     Height: 5\' 6"  (1.676 m)       Intake/Output Summary (Last 24 hours) at 07/26/2017 1254 Last data filed at 07/26/2017 0900 Gross per 24 hour  Intake 1200 ml  Output 200 ml  Net 1000 ml   Filed Weights   07/25/17 1029 07/25/17 1622  Weight: 51.3 kg (113 lb) 52.6 kg (115 lb 14.4 oz)     REVIEW OF SYSTEMS  As per history otherwise all reviewed and reported negative  Exam:  General  exam: awake, alert, NAD. Cooperative.  Respiratory system: Clear. No increased work of breathing. Cardiovascular system: S1 & S2 heard, RRR. No JVD, murmurs, gallops, clicks or pedal edema. Gastrointestinal system: Abdomen is nondistended, soft and nontender. Normal bowel sounds heard. Central nervous system: Alert and oriented. No focal neurological deficits. Extremities: no CCE.  Data Reviewed: Basic Metabolic Panel: Recent Labs  Lab 07/25/17 1054  NA 139  K 4.8  CL 109  CO2 20*  GLUCOSE 106*  BUN 16  CREATININE 0.84  CALCIUM 9.5   Liver Function Tests: Recent Labs  Lab 07/25/17 1054  AST 15  ALT 13*  ALKPHOS 93  BILITOT 0.4  PROT 7.3  ALBUMIN 3.7   No results for input(s): LIPASE, AMYLASE in the last 168 hours. No results for input(s): AMMONIA in the last 168 hours. CBC: Recent Labs  Lab 07/22/17 1129 07/25/17 1054 07/26/17 0649  WBC 8.4 7.7 5.6  NEUTROABS 5,401  --   --   HGB 11.2* 10.1* 9.3*  HCT 34.0* 32.1* 30.0*  MCV 87.4 89.9 90.1  PLT 391 321 300   Cardiac Enzymes: No results for input(s): CKTOTAL, CKMB, CKMBINDEX, TROPONINI in the last 168 hours. CBG (last 3)  No results for input(s): GLUCAP in the last 72 hours. No results found for this or any previous visit (from the past 240 hour(s)).  Studies: No results found.   Scheduled Meds: . amLODipine  2.5 mg Oral Daily  . losartan  50 mg Oral BID  . pantoprazole  40 mg Oral q morning - 10a  . [START ON 07/27/2017] polyethylene glycol-electrolytes  4,000 mL Oral Once  . sodium chloride flush  3 mL Intravenous Q12H   Continuous Infusions: . sodium chloride      Principal Problem:   Rectal bleeding Active Problems:   GIB (gastrointestinal bleeding)   Acute blood loss anemia   Hypertension   Malnutrition of moderate degree (HCC)   Generalized weakness   Diabetes mellitus (HCC)   Metabolic acidosis   Heme positive stool  Time spent:   Irwin Brakeman, MD, FAAFP [facility] Pager [phone]  If 7PM-7AM, please contact night-coverage [email or web address] Password [email or web address] 07/26/2017, 12:54 PM    LOS: 1 day

## 2017-07-26 NOTE — Plan of Care (Signed)
progressing 

## 2017-07-26 NOTE — Consult Note (Signed)
@LOGO @   Primary Care Physician:  [name], MD Primary Gastroenterologist:  Dr. Laural Golden  Pre-Procedure History & Physical: HPI:  Renee Blake is a 84 y.o. female  admitted to the hospital yesterday with a 3-4 week history of painless low volume rectal bleeding.   Presented to the ED yesterday where she was evaluated;  Hemoccult-positive stool on DRE. -  subsequently admitted. She has remained hemodynamically stable; hemoglobin remaining in the 10 range. No abdominal pain, whatsoever. She is tolerating a clear liquid diet when I came to see her today. No further stools so far today. She denies any black tarry stools. No abdominal pain. No upper GI tract symptoms such as odynophagia, dysphagia, early satiety, nausea or vomiting. Seen by Dr. Laural Golden recently as an outpatient. Plans were being made for diagnostic colonoscopy next month. Last colonoscopy Dr. Britta Mccreedy in [facility] 2014 - reportedly negative. Pertinent GI history significant for duodenal ulcer and shatzkis ring requiring dilation in the past. No chronic anticoagulation therapy. Only antiplatelet therapy/NSAID is a low-dose aspirin daily.    Past Medical History:  Diagnosis Date  . Acute blood loss anemia   . Chronic diarrhea   . Dilation of biliary tract    biliary duct per 02/26/13 CT  . Duodenal stricture   . Esophageal stricture   . Gastric ulcer   . GI bleed   . Hyperlipemia   . Hypertension   . Microscopic colitis   . Nephrolithiasis   . Pancreatic duct dilated 02/26/13  . Schatzki's ring   . Skin cancer   . Sliding hiatal hernia   . Vertigo     Past Surgical History:  Procedure Laterality Date  . ESOPHAGOGASTRODUODENOSCOPY N/A 02/27/2013   Procedure: ESOPHAGOGASTRODUODENOSCOPY (EGD);  Surgeon: Ladene Artist, MD;  Location: [facility] ENDOSCOPY;  Service: Endoscopy;  Laterality: N/A;  . ESOPHAGOGASTRODUODENOSCOPY N/A 03/28/2013   Procedure: ESOPHAGOGASTRODUODENOSCOPY (EGD);  Surgeon: Rogene Houston, MD;  Location:  [facility] ENDO SUITE;  Service: Endoscopy;  Laterality: N/A;  . HOT HEMOSTASIS N/A 02/27/2013   Procedure: HOT HEMOSTASIS (ARGON PLASMA COAGULATION/BICAP);  Surgeon: Ladene Artist, MD;  Location: [facility] ENDOSCOPY;  Service: Endoscopy;  Laterality: N/A;  . NOSE SURGERY     for skin cancer    Prior to Admission medications   Medication Sig Start Date End Date Taking? Authorizing Provider  ALPRAZolam (XANAX) 0.25 MG tablet Take 0.25 mg by mouth 2 (two) times daily as needed. for anxiety 02/17/15  Yes [provider]  amLODipine (NORVASC) 2.5 MG tablet Take 2.5 mg by mouth daily.   Yes [provider]  aspirin EC 81 MG tablet Take 81 mg by mouth daily.   Yes [provider]  losartan (COZAAR) 50 MG tablet Take 50 mg by mouth 2 (two) times daily.    Yes [provider]  meclizine (ANTIVERT) 25 MG tablet Take by mouth. 07/11/17  Yes [provider]  pantoprazole (PROTONIX) 40 MG tablet Take 40 mg by mouth every morning.    Yes [provider]  potassium chloride (MICRO-K) 10 MEQ CR capsule Take 10 mEq by mouth 2 (two) times daily.   Yes [provider]  spironolactone (ALDACTONE) 25 MG tablet Take by mouth. 07/11/17  Yes [provider]  TRAVEL SICKNESS 25 MG CHEW Chew 1 tablet by mouth 3 (three) times daily as needed. 07/11/17   [provider]    Allergies as of 07/25/2017 - Review Complete 07/25/2017  Allergen Reaction Noted  . Clonazepam Other (See  Comments) 06/09/2017  . Codeine Nausea And Vomiting and Other (See Comments) 08/24/2012  . Sulfa antibiotics Other (See Comments) 08/24/2012    Family History  Problem Relation Age of Onset  . Heart attack Father     Social History   Socioeconomic History  . Marital status: Widowed    Spouse name: Not on file  . Number of children: Not on file  . Years of education: Not on file  . Highest education level: Not on file  Occupational History  . Not on file  Social  Needs  . Financial resource strain: Not on file  . Food insecurity:    Worry: Not on file    Inability: Not on file  . Transportation needs:    Medical: Not on file    Non-medical: Not on file  Tobacco Use  . Smoking status: Never Smoker  . Smokeless tobacco: Never Used  Substance and Sexual Activity  . Alcohol use: No    Alcohol/week: 0.0 oz  . Drug use: No  . Sexual activity: Never  Lifestyle  . Physical activity:    Days per week: Not on file    Minutes per session: Not on file  . Stress: Not on file  Relationships  . Social connections:    Talks on phone: Not on file    Gets together: Not on file    Attends religious service: Not on file    Active member of club or organization: Not on file    Attends meetings of clubs or organizations: Not on file    Relationship status: Not on file  . Intimate partner violence:    Fear of current or ex partner: Not on file    Emotionally abused: Not on file    Physically abused: Not on file    Forced sexual activity: Not on file  Other Topics Concern  . Not on file  Social History Narrative  . Not on file    Review of Systems: See HPI, otherwise negative ROS  Physical Exam: BP (!) 122/59 (BP Location: Left Arm)   Pulse 62   Temp (!) 97.5 F (36.4 C) (Oral)   Resp 16   Ht 5\' 6"  (1.676 m)   Wt 115 lb 14.4 oz (52.6 kg)   SpO2 98%   BMI 18.71 kg/m  General:   Alert,   pleasant and cooperative in NAD; accompanied by multiple family members Lungs:  Clear throughout to auscultation.   No wheezes, crackles, or rhonchi. No acute distress. Heart:  Regular rate and rhythm; no murmurs, clicks, rubs,  or gallops. Abdomen: Non-distended, normal bowel sounds.  Soft and nontender without appreciable mass or hepatosplenomegaly.  Pulses:  Normal pulses noted. Extremities:  Without clubbing or edema.  Impression:   Very pleasant 84 year old lady with painless low-volume hematochezia over the past 3-4 weeks. Hemoglobin values have  remained stable in the 10 range going back to March of this year. Reported negative colonoscopy in [place] 2014. I suspect bleeding is emanating somewhere in her lower GI tract. Benign anal rectal source,  polyp or development of a interim neoplasm remain in the differential. This would be somewhat atypical presentation for diverticular bleeding.. She does not have any symptoms consistent with upper GI tract pathology at this time  Recommendations:  Given ongoing symptoms and her current hospitalization, I feel it would be best for her to go ahead and undergo colonoscopy while she is here.  I discussed this approach with patient and multiple family members.  Family members concerned that she needed to have an EGD at the same time to "make sure the ulcer has not come back". I do not see an indication for an EGD at this time but explained to patient and family that plans can change depending on clinical course  I have offered the patient a diagnostic colonoscopy April 30 (Dr. Laural Golden).  The risks, benefits, limitations, alternatives and imponderables have been reviewed with the patient. Questions have been answered. All parties are agreeable.  Clear liquid diet with prep tomorrow.  Further recommendations to follow.     Notice: This dictation was prepared with Dragon dictation along with smaller phrase technology. Any transcriptional errors that result from this process are unintentional and may not be corrected upon review.

## 2017-07-26 NOTE — Progress Notes (Signed)
Pt's stool is watery and bright green. Dr. Has been notified.

## 2017-07-27 ENCOUNTER — Other Ambulatory Visit: Payer: Self-pay

## 2017-07-27 LAB — CBC
HCT: 32.4 % — ABNORMAL LOW (ref 36.0–46.0)
Hemoglobin: 10 g/dL — ABNORMAL LOW (ref 12.0–15.0)
MCH: 27.9 pg (ref 26.0–34.0)
MCHC: 30.9 g/dL (ref 30.0–36.0)
MCV: 90.3 fL (ref 78.0–100.0)
Platelets: 396 10*3/uL (ref 150–400)
RBC: 3.59 MIL/uL — ABNORMAL LOW (ref 3.87–5.11)
RDW: 14.6 % (ref 11.5–15.5)
WBC: 7.3 10*3/uL (ref 4.0–10.5)

## 2017-07-27 MED ORDER — BOOST / RESOURCE BREEZE PO LIQD CUSTOM
1.0000 | Freq: Three times a day (TID) | ORAL | Status: DC
  Administered 2017-07-27 – 2017-07-29 (×5): 1 via ORAL

## 2017-07-27 NOTE — Progress Notes (Signed)
PROGRESS NOTE    [name]  ALP:[id number]  DOB: [birth year]  DOA: 07/25/2017 PCP: [name], MD  Brief Admission Hx: Renee Blake is a 84 y.o. female with past medical history detailed below has been dealing with rectal bleeding for approximately the last 4 weeks.  She reports that she has been seeing bright red blood in the rectum.   MDM/Assessment & Plan:   1. Rectal bleeding (BRBPR)  - The patient reports that her symptoms have been ongoing for the past 4 weeks.  She continues to have bright red blood in the rectum and is Hemoccult positive.  GI planning colonoscopy 4/29.  Pt is starting to prep later today.  No further severe rectal bleed reported.  Please see orders and consult notes. Continue supportive therapy.   2. Acute blood loss anemia-type and screened.  Follow hemoglobin. 3. Essential hypertension-resuming home medications and following. 4. Generalized weakness-likely secondary to rectal bleeding and acute blood loss anemia.  DVT Prophylaxis: SCDs Code Status: Full   Family Communication: bedside  Disposition Plan: pending specialty consultation and plan, medical monitoring and medical stabilization   Consultants:  GI   Procedures:  Colonoscopy 4/30   Subjective: Pt says that the bleeding seems to have slowed down.    Objective: Vitals:   07/26/17 0543 07/26/17 1417 07/26/17 2145 07/27/17 0628  BP: (!) 122/59 (!) 141/71 (!) 149/65 (!) 147/69  Pulse: 62 72 70 71  Resp: 16 18 20 18   Temp: (!) 97.5 F (36.4 C)  97.8 F (36.6 C) (!) 97.5 F (36.4 C)  TempSrc: Oral  Oral Oral  SpO2: 98% 100% 98% 99%  Weight:      Height:        Intake/Output Summary (Last 24 hours) at 07/27/2017 1117 Last data filed at 07/27/2017 0800 Gross per 24 hour  Intake 1516 ml  Output 300 ml  Net 1216 ml   Filed Weights   07/25/17 1029 07/25/17 1622  Weight: 51.3 kg (113 lb) 52.6 kg (115 lb 14.4 oz)   REVIEW OF SYSTEMS  As per history otherwise all reviewed  and reported negative  Exam:  General exam: awake, alert, NAD. Cooperative.  Respiratory system: Clear. No increased work of breathing. Cardiovascular system: S1 & S2 heard, RRR. No JVD, murmurs, gallops, clicks or pedal edema. Gastrointestinal system: Abdomen is nondistended, soft and nontender. Normal bowel sounds heard. Central nervous system: Alert and oriented. No focal neurological deficits. Extremities: no CCE.  Data Reviewed: Basic Metabolic Panel: Recent Labs  Lab 07/25/17 1054  NA 139  K 4.8  CL 109  CO2 20*  GLUCOSE 106*  BUN 16  CREATININE 0.84  CALCIUM 9.5   Liver Function Tests: Recent Labs  Lab 07/25/17 1054  AST 15  ALT 13*  ALKPHOS 93  BILITOT 0.4  PROT 7.3  ALBUMIN 3.7   No results for input(s): LIPASE, AMYLASE in the last 168 hours. No results for input(s): AMMONIA in the last 168 hours. CBC: Recent Labs  Lab 07/22/17 1129 07/25/17 1054 07/26/17 0649 07/27/17 0636  WBC 8.4 7.7 5.6 7.3  NEUTROABS 5,401  --   --   --   HGB 11.2* 10.1* 9.3* 10.0*  HCT 34.0* 32.1* 30.0* 32.4*  MCV 87.4 89.9 90.1 90.3  PLT 391 321 300 396   Cardiac Enzymes: No results for input(s): CKTOTAL, CKMB, CKMBINDEX, TROPONINI in the last 168 hours. CBG (last 3)  No results for input(s): GLUCAP in the last 72 hours. No results  found for this or any previous visit (from the past 240 hour(s)).   Studies: No results found.   Scheduled Meds: . amLODipine  2.5 mg Oral Daily  . feeding supplement  1 Container Oral TID BM  . losartan  50 mg Oral BID  . pantoprazole  40 mg Oral q morning - 10a  . polyethylene glycol-electrolytes  4,000 mL Oral Once  . sodium chloride flush  3 mL Intravenous Q12H   Continuous Infusions: . sodium chloride      Principal Problem:   Rectal bleeding Active Problems:   GIB (gastrointestinal bleeding)   Acute blood loss anemia   Hypertension   Malnutrition of moderate degree (HCC)   Generalized weakness   Diabetes mellitus  (HCC)   Metabolic acidosis   Heme positive stool  Time spent:   Irwin Brakeman, MD, FAAFP [facility] Pager [phone]  If 7PM-7AM, please contact night-coverage [email or web address] Password [email or web address] 07/27/2017, 11:17 AM    LOS: 2 days

## 2017-07-28 ENCOUNTER — Encounter (HOSPITAL_COMMUNITY)
Admission: EM | Disposition: A | Discharge status: Other Acute Inpatient Hospital | Payer: Self-pay | Source: Home / Self Care | Attending: Family Medicine

## 2017-07-28 ENCOUNTER — Encounter (HOSPITAL_COMMUNITY): Payer: Self-pay | Admitting: Emergency Medicine

## 2017-07-28 ENCOUNTER — Other Ambulatory Visit: Payer: Self-pay

## 2017-07-28 DIAGNOSIS — K635 Polyp of colon: Secondary | ICD-10-CM

## 2017-07-28 DIAGNOSIS — K625 Hemorrhage of anus and rectum: Secondary | ICD-10-CM

## 2017-07-28 DIAGNOSIS — D62 Acute posthemorrhagic anemia: Secondary | ICD-10-CM

## 2017-07-28 DIAGNOSIS — K626 Ulcer of anus and rectum: Secondary | ICD-10-CM

## 2017-07-28 DIAGNOSIS — D125 Benign neoplasm of sigmoid colon: Secondary | ICD-10-CM

## 2017-07-28 HISTORY — PX: BIOPSY: SHX5522

## 2017-07-28 HISTORY — PX: COLONOSCOPY: SHX5424

## 2017-07-28 HISTORY — PX: POLYPECTOMY: SHX5525

## 2017-07-28 LAB — CBC
HCT: 28 % — ABNORMAL LOW (ref 36.0–46.0)
Hemoglobin: 9.2 g/dL — ABNORMAL LOW (ref 12.0–15.0)
MCH: 29.3 pg (ref 26.0–34.0)
MCHC: 32.9 g/dL (ref 30.0–36.0)
MCV: 89.2 fL (ref 78.0–100.0)
Platelets: 287 10*3/uL (ref 150–400)
RBC: 3.14 MIL/uL — ABNORMAL LOW (ref 3.87–5.11)
RDW: 14.3 % (ref 11.5–15.5)
WBC: 5.9 10*3/uL (ref 4.0–10.5)

## 2017-07-28 SURGERY — COLONOSCOPY
Anesthesia: Moderate Sedation

## 2017-07-28 MED ORDER — SODIUM CHLORIDE 0.9 % IV SOLN: INTRAVENOUS | Status: DC

## 2017-07-28 MED ORDER — MIDAZOLAM HCL 5 MG/5ML IJ SOLN
INTRAMUSCULAR | Status: AC
  Filled 2017-07-28: qty 10

## 2017-07-28 MED ORDER — MEPERIDINE HCL 50 MG/ML IJ SOLN
INTRAMUSCULAR | Status: AC
  Filled 2017-07-28: qty 1

## 2017-07-28 MED ORDER — MEPERIDINE HCL 50 MG/ML IJ SOLN
INTRAMUSCULAR | Status: DC | PRN
  Administered 2017-07-28 (×2): 20 mg via INTRAVENOUS
  Administered 2017-07-28: 10 mg via INTRAVENOUS

## 2017-07-28 MED ORDER — FENTANYL CITRATE (PF) 100 MCG/2ML IJ SOLN
12.5000 ug | INTRAMUSCULAR | Status: DC | PRN
  Administered 2017-07-28 – 2017-07-29 (×2): 12.5 ug via INTRAVENOUS
  Filled 2017-07-28 (×2): qty 2

## 2017-07-28 MED ORDER — SODIUM CHLORIDE 0.9 % IV SOLN
INTRAVENOUS | Status: DC
  Administered 2017-07-28: 12:00:00 via INTRAVENOUS

## 2017-07-28 MED ORDER — STERILE WATER FOR IRRIGATION IR SOLN
Status: DC | PRN
  Administered 2017-07-28: 13:00:00

## 2017-07-28 MED ORDER — MIDAZOLAM HCL 5 MG/5ML IJ SOLN
INTRAMUSCULAR | Status: DC | PRN
  Administered 2017-07-28 (×4): 1 mg via INTRAVENOUS

## 2017-07-28 NOTE — Progress Notes (Signed)
PROGRESS NOTE   Renee Blake  WVP:[id number]  DOB: 07/05/1928  DOA: 07/25/2017 PCP: [name], MD  Brief Admission Hx: Renee Blake is a 84 y.o. female with past medical history detailed below has been dealing with rectal bleeding for approximately the last 4 weeks.  She reports that she has been seeing bright red blood in the rectum.   MDM/Assessment & Plan:   1. Rectal bleeding (BRBPR)  - The patient reports that her symptoms have been ongoing for the past 4 weeks.  GI planning colonoscopy 4/29.  Pt completed prep.  No further severe rectal bleed reported.  Please see orders and consult notes. Continue supportive therapy.   2. Acute blood loss anemia-type and screened.  Follow hemoglobin. Hg stable at 9.2.   3. Essential hypertension-resuming home medications and following. 4. Generalized weakness-likely secondary to rectal bleeding and acute blood loss anemia. 5. Headache - suspect from not eating and bowel prep, symptomatic treatment.   DVT Prophylaxis: SCDs Code Status: Full   Family Communication: bedside  Disposition Plan: pending specialty consultation and plan, medical monitoring and medical stabilization   Consultants:  GI   Procedures:  Colonoscopy 4/30   Subjective: Pt says that she is hungry, the prep was pretty rough for her.    Objective: Vitals:   07/27/17 0628 07/27/17 1503 07/27/17 2136 07/28/17 0628  BP: (!) 147/69 129/72 (!) 141/78 122/61  Pulse: 71 80 87 69  Resp: 18 18 17 17   Temp: (!) 97.5 F (36.4 C) 98 F (36.7 C) 97.9 F (36.6 C) 98.3 F (36.8 C)  TempSrc: Oral  Oral Oral  SpO2: 99% 100% 100% 96%  Weight:      Height:        Intake/Output Summary (Last 24 hours) at 07/28/2017 1017 Last data filed at 07/28/2017 0800 Gross per 24 hour  Intake 483 ml  Output 152 ml  Net 331 ml   Filed Weights   07/25/17 1029 07/25/17 1622  Weight: 51.3 kg (113 lb) 52.6 kg (115 lb 14.4 oz)   REVIEW OF SYSTEMS  As per history otherwise  all reviewed and reported negative  Exam:  General exam: awake, alert, NAD. Cooperative.  Respiratory system: Clear. No increased work of breathing. Cardiovascular system: S1 & S2 heard, RRR. No JVD, murmurs, gallops, clicks or pedal edema. Gastrointestinal system: Abdomen is nondistended, soft and nontender. Normal bowel sounds heard. Central nervous system: Alert and oriented. No focal neurological deficits. Extremities: no CCE.  Data Reviewed: Basic Metabolic Panel: Recent Labs  Lab 07/25/17 1054  NA 139  K 4.8  CL 109  CO2 20*  GLUCOSE 106*  BUN 16  CREATININE 0.84  CALCIUM 9.5   Liver Function Tests: Recent Labs  Lab 07/25/17 1054  AST 15  ALT 13*  ALKPHOS 93  BILITOT 0.4  PROT 7.3  ALBUMIN 3.7   No results for input(s): LIPASE, AMYLASE in the last 168 hours. No results for input(s): AMMONIA in the last 168 hours. CBC: Recent Labs  Lab 07/22/17 1129 07/25/17 1054 07/26/17 0649 07/27/17 0636 07/28/17 0619  WBC 8.4 7.7 5.6 7.3 5.9  NEUTROABS 5,401  --   --   --   --   HGB 11.2* 10.1* 9.3* 10.0* 9.2*  HCT 34.0* 32.1* 30.0* 32.4* 28.0*  MCV 87.4 89.9 90.1 90.3 89.2  PLT 391 321 300 396 287   Cardiac Enzymes: No results for input(s): CKTOTAL, CKMB, CKMBINDEX, TROPONINI in the last 168 hours. CBG (last 3)  No results for input(s): GLUCAP in the last 72 hours. No results found for this or any previous visit (from the past 240 hour(s)).   Studies: No results found.  Scheduled Meds: . amLODipine  2.5 mg Oral Daily  . feeding supplement  1 Container Oral TID BM  . losartan  50 mg Oral BID  . pantoprazole  40 mg Oral q morning - 10a  . sodium chloride flush  3 mL Intravenous Q12H   Continuous Infusions: . sodium chloride      Principal Problem:   Rectal bleeding Active Problems:   GIB (gastrointestinal bleeding)   Acute blood loss anemia   Hypertension   Malnutrition of moderate degree (HCC)   Generalized weakness   Diabetes mellitus (HCC)    Metabolic acidosis   Heme positive stool  Time spent:   Irwin Brakeman, MD, FAAFP [facility] Pager [phone]  If 7PM-7AM, please contact night-coverage [email or web address] Password [email or web address] 07/28/2017, 10:17 AM    LOS: 3 days

## 2017-07-28 NOTE — Care Management Note (Signed)
Case Management Note  Patient Details  Name: Renee Blake MRN: [id number] Date of Birth: 01-09-1929  Subjective/Objective:      Admitted with GIB. Pt from home, lives with daughter, has insurance, PCP, transportation. Pt ind with ADL'S. No difficulties getting medications. Has no HH services. Pta.               Action/Plan: DC home with self care. CM will follow for DC planning needs.   Expected Discharge Date:       4/31/19           Expected Discharge Plan:  Home/Self Care  In-House Referral:  NA  Discharge planning Services  CM Consult  Status of Service:  In process, will continue to follow  If discussed at Long Length of Stay Meetings, dates discussed:    Additional Comments:  Sherald Barge, RN 07/28/2017, 3:26 PM

## 2017-07-28 NOTE — Op Note (Signed)
[facility] Patient Name: Renee Blake Procedure Date: 07/28/2017 12:30 PM MRN: [id number] Date of Birth: 05-17-1928 Attending MD: Hildred Laser , MD CSN: [id number] Age: 84 Admit Type: Inpatient Procedure:                Colonoscopy Indications:              Rectal bleeding Providers:                Hildred Laser, MD, Jeanann Lewandowsky. Sharon Seller, RN, Hinton Rao, RN Referring MD:             Delphina Cahill, MD Medicines:                Meperidine 50 mg IV, Midazolam 4 mg IV Complications:            No immediate complications. Estimated Blood Loss:     Estimated blood loss was minimal. Procedure:                Pre-Anesthesia Assessment:                           - Prior to the procedure, a History and Physical                            was performed, and patient medications and                            allergies were reviewed. The patient's tolerance of                            previous anesthesia was also reviewed. The risks                            and benefits of the procedure and the sedation                            options and risks were discussed with the patient.                            All questions were answered, and informed consent                            was obtained. Prior Anticoagulants: The patient                            last took aspirin 14 days prior to the procedure.                            ASA Grade Assessment: III - A patient with severe                            systemic disease. After reviewing the risks and  benefits, the patient was deemed in satisfactory                            condition to undergo the procedure.                           After obtaining informed consent, the colonoscope                            was passed under direct vision. Throughout the                            procedure, the patient's blood pressure, pulse, and                            oxygen saturations  were monitored continuously. The                            EC-[id number] ([id number]) scope was introduced through                            the anus and advanced to the the cecum, identified                            by appendiceal orifice and ileocecal valve. The                            ileocecal valve, appendiceal orifice, and rectum                            were photographed. The colonoscopy was performed                            without difficulty. The patient tolerated the                            procedure well. The quality of the bowel                            preparation was excellent. Scope In: 1:05:53 PM Scope Out: 1:31:45 PM Scope Withdrawal Time: 0 hours 10 minutes 59 seconds  Total Procedure Duration: 0 hours 25 minutes 52 seconds  Findings:      The perianal exam findings include {skip}nodular hard mass along       anterior aspect of anal orifice.      The digital rectal exam revealed a 5 cm (diameter) hard rectal mass. The       mass was non-circumferential and located predominantly at the anterior       bowel wall.      Biopsies were taken with a cold forceps for histology. The pathology       specimen was placed into Bottle Number 1.      A 6 mm polyp was found in the distal sigmoid colon. The polyp was       semi-pedunculated. The polyp was removed with a hot snare. Resection and  retrieval were complete. The pathology specimen was placed into Bottle       Number 2. To prevent bleeding after the polypectomy, one hemostatic clip       was successfully placed (MR conditional). There was no bleeding during,       or at the end, of the procedure.      The exam was otherwise normal throughout the examined colon. Impression:               - Nodular hard mass along anterior aspect of anal                            orifice. found on perianal exam.                           - Rectal mass with central ulcer.It is submucosal.                           - One 6 mm  polyp in the distal sigmoid colon,                            removed with a hot snare. Resected and retrieved.                            Clip (MR conditional) was placed.                           - Biopsies were taken with a cold forceps for                            histology. Moderate Sedation:      Moderate (conscious) sedation was administered by the endoscopy nurse       and supervised by the endoscopist. The following parameters were       monitored: oxygen saturation, heart rate, blood pressure, CO2       capnography and response to care. Total physician intraservice time was       32 minutes. Recommendation:           - Return patient to hospital ward for ongoing care.                           - Cardiac diet today.                           - Continue present medications.                           - Await pathology results.                           - Gyneacology consultation.                           - Repeat colonoscopy is not recommended. Procedure Code(s):        --- Professional ---  [phone], Colonoscopy, flexible; with removal of                            tumor(s), polyp(s), or other lesion(s) by snare                            technique                           45380, 59, Colonoscopy, flexible; with biopsy,                            single or multiple                           G0500, Moderate sedation services provided by the                            same physician or other qualified health care                            professional performing a gastrointestinal                            endoscopic service that sedation supports,                            requiring the presence of an independent trained                            observer to assist in the monitoring of the                            patient's level of consciousness and physiological                            status; initial 15 minutes of intra-service time;                             patient age 82 years or older (additional time may                            be reported with [phone], as appropriate)                           [phone], Moderate sedation services provided by the                            same physician or other qualified health care                            professional performing the diagnostic or                            therapeutic service that the sedation supports,  requiring the presence of an independent trained                            observer to assist in the monitoring of the                            patient's level of consciousness and physiological                            status; each additional 15 minutes intraservice                            time (List separately in addition to code for                            primary service) Diagnosis Code(s):        --- Professional ---                           K62.89, Other specified diseases of anus and rectum                           D12.5, Benign neoplasm of sigmoid colon                           K62.5, Hemorrhage of anus and rectum CPT copyright 2017 [employer]. All rights reserved. The codes documented in this report are preliminary and upon coder review may  be revised to meet current compliance requirements. Hildred Laser, MD Hildred Laser, MD 07/28/2017 1:44:20 PM This report has been signed electronically. Number of Addenda: 0

## 2017-07-28 NOTE — Progress Notes (Signed)
Patient's hemoglobin is dropped to 9.2 g. Has not passed any blood over the weekend. Has history of bleeding duodenal ulcer in 2014. Colonoscopy was normal in August 8014. If colonoscopy is unremarkable will also proceed with colonoscopy. Patient and both daughters are agreeable.

## 2017-07-28 NOTE — Telephone Encounter (Signed)
[place]

## 2017-07-28 NOTE — Progress Notes (Signed)
Brief colonoscopy  Note:  6 mm polyp with ulcerated surface hot snare from distal sigmoid colon.  Clip applied at the polypectomy site. Firm mass noted on digital exam the anterior wall of rectum. Submucosal distal rectal mass with central ulceration.  Biopsy taken. This mass appears to originate from posterior wall of vulva/vagina.

## 2017-07-29 DIAGNOSIS — K6289 Other specified diseases of anus and rectum: Secondary | ICD-10-CM

## 2017-07-29 DIAGNOSIS — K922 Gastrointestinal hemorrhage, unspecified: Secondary | ICD-10-CM

## 2017-07-29 DIAGNOSIS — N899 Noninflammatory disorder of vagina, unspecified: Secondary | ICD-10-CM

## 2017-07-29 DIAGNOSIS — D098 Carcinoma in situ of other specified sites: Secondary | ICD-10-CM | POA: Diagnosis present

## 2017-07-29 DIAGNOSIS — N898 Other specified noninflammatory disorders of vagina: Secondary | ICD-10-CM | POA: Diagnosis present

## 2017-07-29 LAB — CBC
HCT: 27.2 % — ABNORMAL LOW (ref 36.0–46.0)
Hemoglobin: 8.7 g/dL — ABNORMAL LOW (ref 12.0–15.0)
MCH: 28.5 pg (ref 26.0–34.0)
MCHC: 32 g/dL (ref 30.0–36.0)
MCV: 89.2 fL (ref 78.0–100.0)
Platelets: 293 10*3/uL (ref 150–400)
RBC: 3.05 MIL/uL — ABNORMAL LOW (ref 3.87–5.11)
RDW: 14.2 % (ref 11.5–15.5)
WBC: 6.9 10*3/uL (ref 4.0–10.5)

## 2017-07-29 MED ORDER — ADULT MULTIVITAMIN W/MINERALS CH
1.0000 | ORAL_TABLET | Freq: Every day | ORAL | Status: DC
  Administered 2017-07-29 – 2017-07-30 (×2): 1 via ORAL
  Filled 2017-07-29 (×2): qty 1

## 2017-07-29 MED ORDER — DIPHENOXYLATE-ATROPINE 2.5-0.025 MG PO TABS
1.0000 | ORAL_TABLET | Freq: Four times a day (QID) | ORAL | Status: DC | PRN
  Administered 2017-07-29: 1 via ORAL
  Filled 2017-07-29: qty 1

## 2017-07-29 NOTE — Progress Notes (Signed)
Initial Nutrition Assessment  DOCUMENTATION CODES:      INTERVENTION:  Discontinue Boost Breeze- pt says they give her diarrhea  Food preferences will be obtained by nutrition services daily   Snacks to be provided as pt desires by nursing   Add MVI- recommend she continue with discharge    NUTRITION DIAGNOSIS:  Moderate Malnutrition; Stable. Diagnosed in 03/01/2013. Pt had weight loss from 120-108 lb, mild to moderate muscle loss.   Inadequate oral intake related to inability to eat(Patient NPO 2+ days) as evidenced by NPO status 4/26- 4/29 diet advanced 1333.  GOAL:   Patient will meet greater than or equal to 90% of their needs  MONITOR:   PO intake, Weight trends, Labs  REASON FOR ASSESSMENT:   Malnutrition Screening Tool    ASSESSMENT: Patient is an 84 yo female who presents with rectal bleeding. Hx of GIB and chronic diarrhea.  On 4/29 colonoscopy performed- an ulcerated polyp distal sigmoid colon and mass anterior wall of rectum and distal rectal mass also with ulceration per MD biopsy obtained. Posterior wall of vagina suspected origin. Dr Elonda Husky has been consulted and pt is expecting him to come by this evening.  Her po intake has been limited during hospitalization mostly NPO. Her diet was advanced yesterday for lunch and she has been eating fairly well she says 50-75% of her meals. At home she is independent with her food preparation and still cooks sometimes. She prefers to eat small meals throughout the day.    Standing weight obtained at bedside per pt. Review of weight hx shows range primarily between 115-118 lb for past 8 months. She is borderline underweight.  Labs: BMP Latest Ref Rng & Units 07/25/2017 07/10/2017 06/18/2017  Glucose 65 - 99 mg/dL 106(H) 95 103(H)  BUN 6 - 20 mg/dL 16 12 10   Creatinine 0.44 - 1.00 mg/dL 0.84 0.88 0.79  Sodium 135 - 145 mmol/L 139 137 136  Potassium 3.5 - 5.1 mmol/L 4.8 3.9 3.4(L)  Chloride 101 - 111 mmol/L 109 106 104  CO2  22 - 32 mmol/L 20(L) 21(L) 21(L)  Calcium 8.9 - 10.3 mg/dL 9.5 9.5 8.9     Scheduled meds: . amLODipine  2.5 mg Oral Daily  . feeding supplement  1 Container Oral TID BM  . losartan  50 mg Oral BID  . pantoprazole  40 mg Oral q morning - 10a  . sodium chloride flush  3 mL Intravenous Q12H   Past Medical History:  Diagnosis Date  . Acute blood loss anemia   . Chronic diarrhea   . Dilation of biliary tract    biliary duct per 02/26/13 CT  . Duodenal stricture   . Esophageal stricture   . Gastric ulcer   . GI bleed   . Hyperlipemia   . Hypertension   . Microscopic colitis   . Nephrolithiasis   . Pancreatic duct dilated 02/26/13  . Schatzki's ring   . Skin cancer   . Sliding hiatal hernia   . Vertigo      NUTRITION - FOCUSED PHYSICAL EXAM:    Most Recent Value  Orbital Region  Mild depletion  Upper Arm Region  Moderate depletion  Thoracic and Lumbar Region  Mild depletion  Buccal Region  Mild depletion  Temple Region  Mild depletion  Clavicle Bone Region  Moderate depletion  Clavicle and Acromion Bone Region  Moderate depletion  Dorsal Hand  Mild depletion  Edema (RD Assessment)  None  Hair  Reviewed  Eyes  Reviewed  Mouth  Reviewed  Skin  Reviewed  Nails  Reviewed     Diet Order:   Diet Order           Diet Heart Room service appropriate? Yes; Fluid consistency: Thin  Diet effective now          EDUCATION NEEDS:   Education needs have been addressed Skin:  Skin Assessment: Reviewed RN Assessment  Last BM:  4/30 liquid stool (received bowel prep for procedure)  Height:   Ht Readings from Last 1 Encounters:  07/25/17 5\' 6"  (1.676 m)    Weight:   Wt Readings from Last 1 Encounters:  07/25/17 115 lb 14.4 oz (52.6 kg)    Ideal Body Weight:  59 kg  BMI:  Body mass index is 18.71 kg/m.  Estimated Nutritional Needs:   Kcal:  1471-1590   Protein:  63-69 gr  Fluid:  1.5-1.6 liters daily   Colman Cater MS,RD,CSG,LDN Office:  [phone] Pager: [phone]

## 2017-07-29 NOTE — Progress Notes (Signed)
PROGRESS NOTE    Renee Blake  JKD:[id number]  DOB: November 09, 1928  DOA: 07/25/2017 PCP: [name], MD   Brief Admission Hx: Renee Blake a 84 y.o.femalewith past medical history detailed below has been dealing with rectal bleeding for approximately the last 4 weeks. She reports that she has been seeing bright red blood in the rectum.   MDM/Assessment & Plan:   1. Rectal bleeding (BRBPR)  - Thepatient reports that her symptoms have been ongoing for the past 4 weeks. GI planning colonoscopy 4/29.  Pt completed prep.  No further severe rectal bleed reported.  Please see orders and consult notes. Continue supportive therapy. Colonoscopy performed  4/29 by Dr. Laural Golden. A 6 mm polyp with ulceration was found and removed. He noted a firm mass on digital exam on the anterior wall of the rectum. Submucosal distal rectal mass that originates from the posterior wall of the vagina. A biopsy was taken on 4/29. Results are pending. 2. Acute blood loss anemia-type and screened. Follow hemoglobin. Hg stable at 9.2.   3. Essential hypertension-resuming home medications and following. 4. Generalized weakness-likely secondary to rectal bleeding and acute blood loss anemia. 5. Headache - suspect from not eating and bowel prep, symptomatic treatment.  6.   Vaginal Mass - Mass originating from posterior wall of vulva/vagina. GYN consultation ordered.   DVT prophylaxis: SCDs Code Status: FULL Family Communication: Bedsdie Disposition Plan: Discharge tomorrow pending consult with OB/GYN.   Consultants:  GI; Dr. Laural Golden  OB/GYN with Dr. Elonda Husky.  Procedures:  Colonoscopy 4/29   Subjective: Patient states she wants to go home.  Objective: Vitals:   07/28/17 1359 07/28/17 1620 07/28/17 2105 07/29/17 0643  BP: (!) 114/53  125/60 139/73  Pulse: 80  83 90  Resp: 16  16   Temp:  (!) 97.3 F (36.3 C) 98.3 F (36.8 C) (!) 97.5 F (36.4 C)  TempSrc:  Oral Oral Oral  SpO2: 96%  96% 99%    Weight:      Height:        Intake/Output Summary (Last 24 hours) at 07/29/2017 0998 Last data filed at 07/28/2017 2100 Gross per 24 hour  Intake 905 ml  Output -  Net 905 ml   Filed Weights   07/25/17 1029 07/25/17 1622  Weight: 51.3 kg (113 lb) 52.6 kg (115 lb 14.4 oz)     REVIEW OF SYSTEMS  As per history otherwise all reviewed and reported negative  Exam:  General exam: Awake and alert x 4. Calm and cooperative. NAD Respiratory system: Clear. No increased work of breathing. Cardiovascular system: S1 & S2 heard, RRR. No JVD, murmurs, gallops, clicks or pedal edema. Gastrointestinal system: Abdomen is nondistended, soft and nontender. Normal bowel sounds heard. Central nervous system: Alert and oriented. No focal neurological deficits. Extremities: no CCE.  Data Reviewed: Basic Metabolic Panel: Recent Labs  Lab 07/25/17 1054  NA 139  K 4.8  CL 109  CO2 20*  GLUCOSE 106*  BUN 16  CREATININE 0.84  CALCIUM 9.5   Liver Function Tests: Recent Labs  Lab 07/25/17 1054  AST 15  ALT 13*  ALKPHOS 93  BILITOT 0.4  PROT 7.3  ALBUMIN 3.7   No results for input(s): LIPASE, AMYLASE in the last 168 hours. No results for input(s): AMMONIA in the last 168 hours. CBC: Recent Labs  Lab 07/22/17 1129 07/25/17 1054 07/26/17 0649 07/27/17 0636 07/28/17 0619 07/29/17 0556  WBC 8.4 7.7 5.6 7.3 5.9 6.9  NEUTROABS 5,401  --   --   --   --   --  HGB 11.2* 10.1* 9.3* 10.0* 9.2* 8.7*  HCT 34.0* 32.1* 30.0* 32.4* 28.0* 27.2*  MCV 87.4 89.9 90.1 90.3 89.2 89.2  PLT 391 321 300 396 287 293   Cardiac Enzymes: No results for input(s): CKTOTAL, CKMB, CKMBINDEX, TROPONINI in the last 168 hours. CBG (last 3)  No results for input(s): GLUCAP in the last 72 hours. No results found for this or any previous visit (from the past 240 hour(s)).   Studies: No results found.   Scheduled Meds: . amLODipine  2.5 mg Oral Daily  . feeding supplement  1 Container Oral TID BM  .  losartan  50 mg Oral BID  . pantoprazole  40 mg Oral q morning - 10a  . sodium chloride flush  3 mL Intravenous Q12H   Continuous Infusions: . sodium chloride      Principal Problem:   Rectal bleeding Active Problems:   GIB (gastrointestinal bleeding)   Acute blood loss anemia   Hypertension   Malnutrition of moderate degree (HCC)   Generalized weakness   Diabetes mellitus (HCC)   Metabolic acidosis   Heme positive stool   Time spent:   Ashok Norris, Student AGACNP   Attending:  Irwin Brakeman, MD, FAAFP [facility] Pager [phone]  If 7PM-7AM, please contact night-coverage [email or web address] Password [email or web address] 07/29/2017, 8:32 AM    LOS: 4 days

## 2017-07-29 NOTE — Progress Notes (Signed)
Patient denies rectal or vaginal bleeding. Normal pain.  She states she is is eating most of her meals. Hemoglobin is 8.7 g and her daughter is very concerned. Biopsy results will not be available until tomorrow. Gynecologic consultation pending. Will check CBC in a.m.  Will do H&H later today if she has overt bleed.

## 2017-07-30 ENCOUNTER — Encounter (HOSPITAL_COMMUNITY): Payer: Self-pay | Admitting: Internal Medicine

## 2017-07-30 DIAGNOSIS — R911 Solitary pulmonary nodule: Secondary | ICD-10-CM | POA: Diagnosis not present

## 2017-07-30 DIAGNOSIS — K629 Disease of anus and rectum, unspecified: Secondary | ICD-10-CM | POA: Diagnosis not present

## 2017-07-30 DIAGNOSIS — K6289 Other specified diseases of anus and rectum: Secondary | ICD-10-CM | POA: Diagnosis not present

## 2017-07-30 DIAGNOSIS — E785 Hyperlipidemia, unspecified: Secondary | ICD-10-CM | POA: Diagnosis not present

## 2017-07-30 DIAGNOSIS — Z8711 Personal history of peptic ulcer disease: Secondary | ICD-10-CM | POA: Diagnosis not present

## 2017-07-30 DIAGNOSIS — K219 Gastro-esophageal reflux disease without esophagitis: Secondary | ICD-10-CM | POA: Diagnosis not present

## 2017-07-30 DIAGNOSIS — C20 Malignant neoplasm of rectum: Secondary | ICD-10-CM | POA: Diagnosis not present

## 2017-07-30 DIAGNOSIS — C801 Malignant (primary) neoplasm, unspecified: Secondary | ICD-10-CM | POA: Diagnosis not present

## 2017-07-30 DIAGNOSIS — K625 Hemorrhage of anus and rectum: Secondary | ICD-10-CM | POA: Diagnosis not present

## 2017-07-30 DIAGNOSIS — R918 Other nonspecific abnormal finding of lung field: Secondary | ICD-10-CM | POA: Diagnosis not present

## 2017-07-30 DIAGNOSIS — I110 Hypertensive heart disease with heart failure: Secondary | ICD-10-CM | POA: Diagnosis not present

## 2017-07-30 DIAGNOSIS — Z85828 Personal history of other malignant neoplasm of skin: Secondary | ICD-10-CM | POA: Diagnosis not present

## 2017-07-30 DIAGNOSIS — I5030 Unspecified diastolic (congestive) heart failure: Secondary | ICD-10-CM | POA: Diagnosis not present

## 2017-07-30 DIAGNOSIS — C7982 Secondary malignant neoplasm of genital organs: Secondary | ICD-10-CM | POA: Diagnosis not present

## 2017-07-30 DIAGNOSIS — E119 Type 2 diabetes mellitus without complications: Secondary | ICD-10-CM | POA: Diagnosis not present

## 2017-07-30 DIAGNOSIS — D62 Acute posthemorrhagic anemia: Secondary | ICD-10-CM | POA: Diagnosis not present

## 2017-07-30 DIAGNOSIS — C44520 Squamous cell carcinoma of anal skin: Secondary | ICD-10-CM | POA: Diagnosis not present

## 2017-07-30 DIAGNOSIS — N899 Noninflammatory disorder of vagina, unspecified: Secondary | ICD-10-CM | POA: Diagnosis not present

## 2017-07-30 DIAGNOSIS — I10 Essential (primary) hypertension: Secondary | ICD-10-CM | POA: Diagnosis not present

## 2017-07-30 DIAGNOSIS — Z882 Allergy status to sulfonamides status: Secondary | ICD-10-CM | POA: Diagnosis not present

## 2017-07-30 DIAGNOSIS — I5032 Chronic diastolic (congestive) heart failure: Secondary | ICD-10-CM | POA: Diagnosis not present

## 2017-07-30 DIAGNOSIS — C785 Secondary malignant neoplasm of large intestine and rectum: Secondary | ICD-10-CM | POA: Diagnosis not present

## 2017-07-30 LAB — CBC
HCT: 28.4 % — ABNORMAL LOW (ref 36.0–46.0)
Hemoglobin: 9.2 g/dL — ABNORMAL LOW (ref 12.0–15.0)
MCH: 28.9 pg (ref 26.0–34.0)
MCHC: 32.4 g/dL (ref 30.0–36.0)
MCV: 89.3 fL (ref 78.0–100.0)
Platelets: 296 10*3/uL (ref 150–400)
RBC: 3.18 MIL/uL — ABNORMAL LOW (ref 3.87–5.11)
RDW: 14.2 % (ref 11.5–15.5)
WBC: 6.6 10*3/uL (ref 4.0–10.5)

## 2017-07-30 NOTE — Discharge Summary (Signed)
Physician Discharge Summary  Renee Blake ZOX:[id number] DOB: 1928-04-02 DOA: 07/25/2017  PCP: [name], MD  Admit date: 07/25/2017 Discharge date: 07/30/2017  Time spent: 45 minutes  Recommendations for Outpatient Follow-up:  1. Patient being transferred under the care of Dr. Janie Morning to gynecology oncology [facility] for further evaluation of clinical/rectovaginal malignancy-?  XRT 2. 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 Would recommend follow-up of pathology based on colonoscopy performed 4/30-had a polyp and this is pending 4. Further care as per gynecology/oncology  Discharge Diagnoses:  Principal Problem:   Rectal bleeding Active Problems:   Lower GI bleed   Acute blood loss anemia   Hypertension   Malnutrition of moderate degree (HCC)   Generalized weakness   Diabetes mellitus (HCC)   Metabolic acidosis   Heme positive stool   Vaginal mass   Discharge Condition: Fair to guarded  Diet recommendation: Heart healthy  Filed Weights   07/25/17 1029 07/25/17 1622  Weight: 51.3 kg (113 lb) 52.6 kg (115 lb 14.4 oz)    History of present illness:  This 84 year old female with a history of Schatzki ring, prior esophageal and duodenal strictures-prior dilatation of biliary tract 2014 chronic steatorrhea?  Sliding hiatal hernia and vertigo in addition to facial skin cancer under treatment with Mohs micrographic surgery was admitted 07/25/2017 to [facility] with presumed GI bleed-it was noted that she had GI bleeding subacutely and gastroenterology saw the patient in consult Eventually the patient had a colonoscopy on 4/29 (colonoscopy normal 10/2012, bleeding ulcer from duodenum 2014) which showed ulcerated polyp which was hot snared from the distal sigmoid colon There was a firm  mass noted on digital exam of anterior rectal wall-the gastroenterologist Dr. Raye Sorrow felt this mass originated from posterior wall of the vulva/vagina- Gynecologist Dr. Frederich Balding E saw the patient in consult and coordinated for the patient to be transferred to [facility] under the care of Dr. Skeet Latch for further evaluation?  XRT Her hemoglobin peaked to 9.2 during hospital stay without any transfusion the Nadear was 8.7 and she did not require blood transfusion She is currently stable at this time and is ready for transport   Procedures: Impression:               - Nodular hard mass along anterior aspect of anal                            orifice. found on perianal exam.                           - Rectal mass with central ulcer.It is submucosal.                           - One 6 mm polyp in the distal sigmoid colon,                            removed with a hot snare. Resected and retrieved.                            Clip (MR conditional) was placed.                           -  Biopsies were taken with a cold forceps for                            histology.   Consultations:  See above discussion  Discharge Exam: Vitals:   07/29/17 2124 07/30/17 0414  BP:  118/64  Pulse:  77  Resp:    Temp:  98.2 F (36.8 C)  SpO2: 100% 95%    General: Awake alert no distress Cardiovascular: S1-S2 no murmur rub or gallop Respiratory: Clinically clear no added sound Looks frail, postsurgical changes to nose and face, no submandibular lymphadenopathy, abdomen is soft nontender No lower extremity edema No rash Euthymic and congruent  Discharge Instructions   Discharge Instructions    Diet - low sodium heart healthy   Complete by:  As directed    Increase activity slowly   Complete by:  As directed      Allergies as of 07/30/2017      Reactions   Clonazepam Other (See Comments)   Pruritis   Codeine Nausea And Vomiting, Other (See Comments)   Reaction unknown   Sulfa  Antibiotics Other (See Comments)   Reactions unknown      Medication List    STOP taking these medications   potassium chloride 10 MEQ CR capsule Commonly known as:  MICRO-K   spironolactone 25 MG tablet Commonly known as:  ALDACTONE     TAKE these medications   ALPRAZolam 0.25 MG tablet Commonly known as:  XANAX Take 0.25 mg by mouth 2 (two) times daily as needed. for anxiety   amLODipine 2.5 MG tablet Commonly known as:  NORVASC Take 2.5 mg by mouth daily.   aspirin EC 81 MG tablet Take 81 mg by mouth daily.   losartan 50 MG tablet Commonly known as:  COZAAR Take 50 mg by mouth 2 (two) times daily.   meclizine 25 MG tablet Commonly known as:  ANTIVERT Take by mouth. What changed:  Another medication with the same name was removed. Continue taking this medication, and follow the directions you see here.   pantoprazole 40 MG tablet Commonly known as:  PROTONIX Take 40 mg by mouth every morning.      Allergies  Allergen Reactions  . Clonazepam Other (See Comments)    Pruritis  . Codeine Nausea And Vomiting and Other (See Comments)    Reaction unknown  . Sulfa Antibiotics Other (See Comments)    Reactions unknown      The results of significant diagnostics from this hospitalization (including imaging, microbiology, ancillary and laboratory) are listed below for reference.    Significant Diagnostic Studies: Ct Abdomen Pelvis W Contrast  Result Date: 07/10/2017 CLINICAL DATA:  Heavy vaginal bleeding x1 week. EXAM: CT ABDOMEN AND PELVIS WITH CONTRAST TECHNIQUE: Multidetector CT imaging of the abdomen and pelvis was performed using the standard protocol following bolus administration of intravenous contrast. CONTRAST:  123mL ISOVUE-300 IOPAMIDOL (ISOVUE-300) INJECTION 61% COMPARISON:  12/02/2013 FINDINGS: Lower chest: Top-normal size heart without pericardial effusion. Scarring and/or atelectasis in the lingula. No effusion or pneumothorax. Hepatobiliary: No focal  liver abnormality is seen. Mild intra and extrahepatic ductal dilatation, stable in appearance without choledocholithiasis. No gallstones or secondary signs of acute cholecystitis. Pancreas: Atrophic pancreas without inflammation or mass. Spleen: Stable hypervascular lesions in the inferior pole of the spleen measuring approximately 6 mm in diameter, stable in appearance and likely representing a benign finding such as hemangiomas or hemopoietic rests. Adrenals/Urinary Tract: Normal bilateral  adrenal glands. Water attenuating cyst off the upper pole of the right kidney measuring 22 mm, unchanged in appearance. Punctate nonobstructing right lower pole renal calculus. Chronic stable ureterectasis and extrarenal pelvis on the right. Decompressed urinary bladder with posterior inferior bladder wall thickening as before. No discrete enhancing bladder lesions. Stomach/Bowel: Contrast distention of the stomach normal small bowel rotation. No bowel obstruction or inflammation. Normal appendix. Average amount of fecal retention within the colon without inflammation. Vascular/Lymphatic: Moderate aortoiliac atherosclerosis. No lymphadenopathy. Reproductive: Pelvic floor relaxation with mild prolapse the vagina and rectum. Other: No free air nor free fluid. Musculoskeletal: Stable lumbar levoscoliosis with degenerative disc and facet arthropathy of the lumbar spine. IMPRESSION: 1. Pelvic floor relaxation with what appears to be mild prolapse of the rectum and vaginal canal. 2. Chronic stable findings otherwise. Electronically Signed   By: Ashley Royalty M.D.   On: 07/10/2017 23:21    Microbiology: No results found for this or any previous visit (from the past 240 hour(s)).   Labs: Basic Metabolic Panel: Recent Labs  Lab 07/25/17 1054  NA 139  K 4.8  CL 109  CO2 20*  GLUCOSE 106*  BUN 16  CREATININE 0.84  CALCIUM 9.5   Liver Function Tests: Recent Labs  Lab 07/25/17 1054  AST 15  ALT 13*  ALKPHOS 93   BILITOT 0.4  PROT 7.3  ALBUMIN 3.7   No results for input(s): LIPASE, AMYLASE in the last 168 hours. No results for input(s): AMMONIA in the last 168 hours. CBC: Recent Labs  Lab 07/26/17 0649 07/27/17 0636 07/28/17 0619 07/29/17 0556 07/30/17 0602  WBC 5.6 7.3 5.9 6.9 6.6  HGB 9.3* 10.0* 9.2* 8.7* 9.2*  HCT 30.0* 32.4* 28.0* 27.2* 28.4*  MCV 90.1 90.3 89.2 89.2 89.3  PLT 300 396 287 293 296   Cardiac Enzymes: No results for input(s): CKTOTAL, CKMB, CKMBINDEX, TROPONINI in the last 168 hours. BNP: BNP (last 3 results) No results for input(s): BNP in the last 8760 hours.  ProBNP (last 3 results) No results for input(s): PROBNP in the last 8760 hours.  CBG: No results for input(s): GLUCAP in the last 168 hours.     Signed:  Nita Sells MD   [facility] 07/30/2017, 12:24 PM

## 2017-07-30 NOTE — Progress Notes (Signed)
Patient transported to [facility]  For further evaluation,report given to Admission nurse RN. Family at bedside.Vital signs stable. Transported by EMS to awaiting hospital.

## 2017-07-30 NOTE — Care Management Important Message (Signed)
Important Message  Patient Details  Name: Renee Blake MRN: [id number] Date of Birth: [birth year]   Medicare Important Message Given:  Yes    Shelda Altes 07/30/2017, 10:59 AM

## 2017-07-30 NOTE — Progress Notes (Signed)
Patient ID: Renee Blake, female   DOB: Jul 02, 1928, 84 y.o.   MRN: [id number] Arrangements have been mad through Dr Janie Morning, gyn oncology at [facility] for transfer  Dr Skeet Latch stated this sounded like a cloacal or rectovaginal septum malignancy and, assuming this is verified via biopsy, would be treated with XRT  I informed the patient and her family regarding my conversation with Dr Skeet Latch and they are in agreement with transfer, all questions answered  Hospitalist service aware  Arrangements made to send disc of her CT scan as well

## 2017-07-30 NOTE — Consult Note (Signed)
Reason for Consult:Vaginal mass Referring Physician: Dr Garen Grams is an 84 y.o. female. Admitted to the hospital 5 days prior with rectal vs vaginal bleeding.  Had been having bleeding for the past 2 weeks or so Heme + stools, colonoscopy by Dr Laural Golden, which was benign and he called me to evaluate for vagina mass found at the time of colonoscopy   Menstrual History:  No LMP recorded. Patient is postmenopausal.    Past Medical History:  Diagnosis Date  . Acute blood loss anemia   . Chronic diarrhea   . Dilation of biliary tract    biliary duct per 02/26/13 CT  . Duodenal stricture   . Esophageal stricture   . Gastric ulcer   . GI bleed   . Hyperlipemia   . Hypertension   . Microscopic colitis   . Nephrolithiasis   . Pancreatic duct dilated 02/26/13  . Schatzki's ring   . Skin cancer   . Sliding hiatal hernia   . Vertigo     Past Surgical History:  Procedure Laterality Date  . BIOPSY  07/28/2017   Procedure: BIOPSY;  Surgeon: Rogene Houston, MD;  [facility];  Service: Endoscopy;;  rectum  . COLONOSCOPY N/A 07/28/2017   Procedure: COLONOSCOPY;  Surgeon: Rogene Houston, MD;  [facility];  Service: Endoscopy;  Laterality: N/A;  . ESOPHAGOGASTRODUODENOSCOPY N/A 02/27/2013   Procedure: ESOPHAGOGASTRODUODENOSCOPY (EGD);  Surgeon: Ladene Artist, MD;  Location: [facility] ENDOSCOPY;  Service: Endoscopy;  Laterality: N/A;  . ESOPHAGOGASTRODUODENOSCOPY N/A 03/28/2013   Procedure: ESOPHAGOGASTRODUODENOSCOPY (EGD);  Surgeon: Rogene Houston, MD;  [facility];  Service: Endoscopy;  Laterality: N/A;  . HOT HEMOSTASIS N/A 02/27/2013   Procedure: HOT HEMOSTASIS (ARGON PLASMA COAGULATION/BICAP);  Surgeon: Ladene Artist, MD;  Location: [facility] ENDOSCOPY;  Service: Endoscopy;  Laterality: N/A;  . NOSE SURGERY     for skin cancer  . POLYPECTOMY  07/28/2017   Procedure: POLYPECTOMY;  Surgeon: Rogene Houston, MD;  [facility]: [facility];   Service: Endoscopy;;  colon     Family History  Problem Relation Age of Onset  . Heart attack Father     Social History:  reports that she has never smoked. She has never used smokeless tobacco. She reports that she does not drink alcohol or use drugs.  Allergies:  Allergies  Allergen Reactions  . Clonazepam Other (See Comments)    Pruritis  . Codeine Nausea And Vomiting and Other (See Comments)    Reaction unknown  . Sulfa Antibiotics Other (See Comments)    Reactions unknown    Medications: I have reviewed the patient's current medications.  ROS  Blood pressure 118/64, pulse 77, temperature 98.2 F (36.8 C), temperature source Oral, resp. rate 18, height 5\' 6"  (1.676 m), weight 115 lb 14.4 oz (52.6 kg), SpO2 95 %. Physical Exam  Atophic external genitalia with Grade II uterine and anteriror POP Exam reveals normal vaginal mucosa but a rock hard mass emanating between the vagian and rectum I believe, I would describe it as a rectovagianl septum mass which given her age and firmness most certainly is a malignancy and I would guess a sarcoma Rectal confirms Bimanual reveals no other findings  CT scan is reviewed and does not really show this mass  Results for orders placed or performed during the hospital encounter of 07/25/17 (from the past 48 hour(s))  CBC     Status: Abnormal   Collection Time: 07/29/17  5:56 AM  Result Value Ref Range   WBC 6.9 4.0 - 10.5 K/uL   RBC 3.05 (L) 3.87 - 5.11 MIL/uL   Hemoglobin 8.7 (L) 12.0 - 15.0 g/dL   HCT 27.2 (L) 36.0 - 46.0 %   MCV 89.2 78.0 - 100.0 fL   MCH 28.5 26.0 - 34.0 pg   MCHC 32.0 30.0 - 36.0 g/dL   RDW 14.2 11.5 - 15.5 %   Platelets 293 150 - 400 K/uL    Comment: Performed at [facility], [address] [place], [place] [address]  CBC     Status: Abnormal   Collection Time: 07/30/17  6:02 AM  Result Value Ref Range   WBC 6.6 4.0 - 10.5 K/uL   RBC 3.18 (L) 3.87 - 5.11 MIL/uL   Hemoglobin 9.2 (L) 12.0 - 15.0 g/dL    HCT 28.4 (L) 36.0 - 46.0 %   MCV 89.3 78.0 - 100.0 fL   MCH 28.9 26.0 - 34.0 pg   MCHC 32.4 30.0 - 36.0 g/dL   RDW 14.2 11.5 - 15.5 %   Platelets 296 150 - 400 K/uL    Comment: Performed at [facility], [address] [place], [place] [address]   CLINICAL DATA:  Heavy vaginal bleeding x1 week.  EXAM: CT ABDOMEN AND PELVIS WITH CONTRAST  TECHNIQUE: Multidetector CT imaging of the abdomen and pelvis was performed using the standard protocol following bolus administration of intravenous contrast.  CONTRAST:  160mL ISOVUE-300 IOPAMIDOL (ISOVUE-300) INJECTION 61%  COMPARISON:  12/02/2013  FINDINGS: Lower chest: Top-normal size heart without pericardial effusion. Scarring and/or atelectasis in the lingula. No effusion or pneumothorax.  Hepatobiliary: No focal liver abnormality is seen. Mild intra and extrahepatic ductal dilatation, stable in appearance without choledocholithiasis. No gallstones or secondary signs of acute cholecystitis.  Pancreas: Atrophic pancreas without inflammation or mass.  Spleen: Stable hypervascular lesions in the inferior pole of the spleen measuring approximately 6 mm in diameter, stable in appearance and likely representing a benign finding such as hemangiomas or hemopoietic rests.  Adrenals/Urinary Tract: Normal bilateral adrenal glands. Water attenuating cyst off the upper pole of the right kidney measuring 22 mm, unchanged in appearance. Punctate nonobstructing right lower pole renal calculus. Chronic stable ureterectasis and extrarenal pelvis on the right. Decompressed urinary bladder with posterior inferior bladder wall thickening as before. No discrete enhancing bladder lesions.  Stomach/Bowel: Contrast distention of the stomach normal small bowel rotation. No bowel obstruction or inflammation. Normal appendix. Average amount of fecal retention within the colon without inflammation.  Vascular/Lymphatic: Moderate aortoiliac  atherosclerosis. No lymphadenopathy.  Reproductive: Pelvic floor relaxation with mild prolapse the vagina and rectum.  Other: No free air nor free fluid.  Musculoskeletal: Stable lumbar levoscoliosis with degenerative disc and facet arthropathy of the lumbar spine.  IMPRESSION: 1. Pelvic floor relaxation with what appears to be mild prolapse of the rectum and vaginal canal. 2. Chronic stable findings otherwise.   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

## 2017-07-30 NOTE — Progress Notes (Signed)
Patient has no complaints. Patient has not passed any more blood per rectum. Patient denies abdominal pain. Hemoglobin 9.2 g today.  Pathology results as follows.  Sigmoid colon polyp inflammatory polyp with surface ulceration. Biopsy of submucosal distal rectal mass shows moderately differentiated squamous cell carcinoma.  Abdominopelvic CT from 07/10/2017 reviewed with Dr. Sandi Mariscal. It was felt to be mild prolapse of rectum and vaginal canal which appeared to be due to this lesion.  No pelvic or retroperitoneal adenopathy.  No liver lesion noted.   Assessment:  Rectal bleeding secondary to squamous cell carcinoma the esophagus extending to distal rectum with ulceration.  Dr. Elonda Husky not noticed any abnormality involving introitus. Therefore primary may be perianal skin or any canal.  However at the time of colonoscopy I did not feel the primary was anal canal. Patient is to be transferred to [facility] for further management. We will sign off.

## 2017-07-31 DIAGNOSIS — C785 Secondary malignant neoplasm of large intestine and rectum: Secondary | ICD-10-CM | POA: Diagnosis not present

## 2017-07-31 DIAGNOSIS — Z85828 Personal history of other malignant neoplasm of skin: Secondary | ICD-10-CM | POA: Diagnosis not present

## 2017-07-31 DIAGNOSIS — C7982 Secondary malignant neoplasm of genital organs: Secondary | ICD-10-CM | POA: Diagnosis not present

## 2017-08-01 DIAGNOSIS — C7982 Secondary malignant neoplasm of genital organs: Secondary | ICD-10-CM | POA: Diagnosis not present

## 2017-08-01 DIAGNOSIS — C785 Secondary malignant neoplasm of large intestine and rectum: Secondary | ICD-10-CM | POA: Diagnosis not present

## 2017-08-01 DIAGNOSIS — K629 Disease of anus and rectum, unspecified: Secondary | ICD-10-CM | POA: Diagnosis not present

## 2017-08-01 DIAGNOSIS — Z85828 Personal history of other malignant neoplasm of skin: Secondary | ICD-10-CM | POA: Diagnosis not present

## 2017-08-01 IMAGING — CT CT OUTSIDE FILMS CHEST
1 of 2 series · 9 of 16 positions shown, 12 images · non-contrast
Comparison: none

[Series 2: ct chest axial 2mm · axial · 0.81mm/px · z∈[-1116,-878]mm · 9 of 149 slices shown, 12 images]
[im 15/149  soft-tissue]
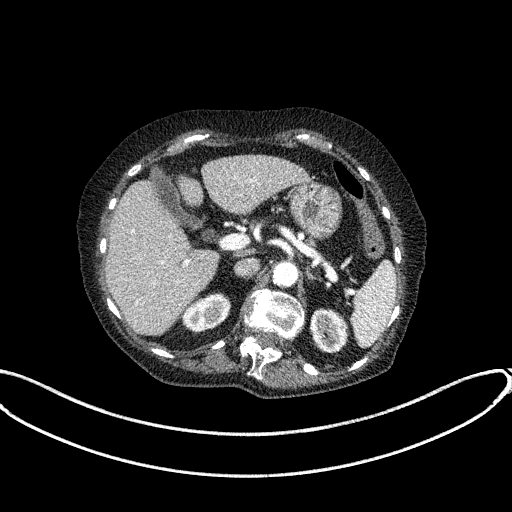
[im 15/149  bone]
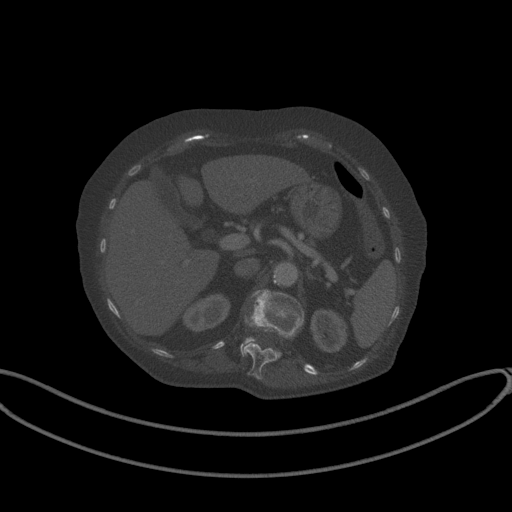
[im 30/149  soft-tissue]
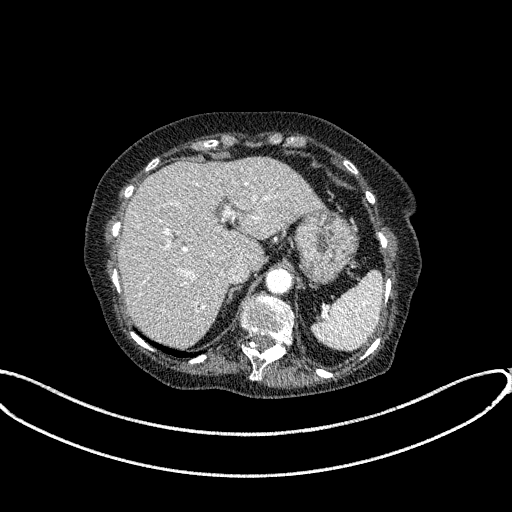
[im 45/149  soft-tissue]
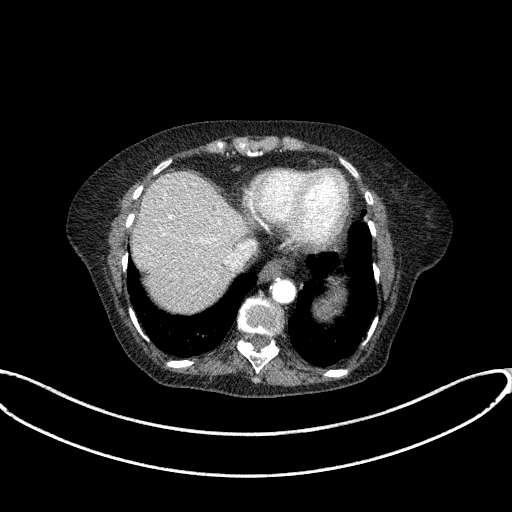
[im 60/149  soft-tissue]
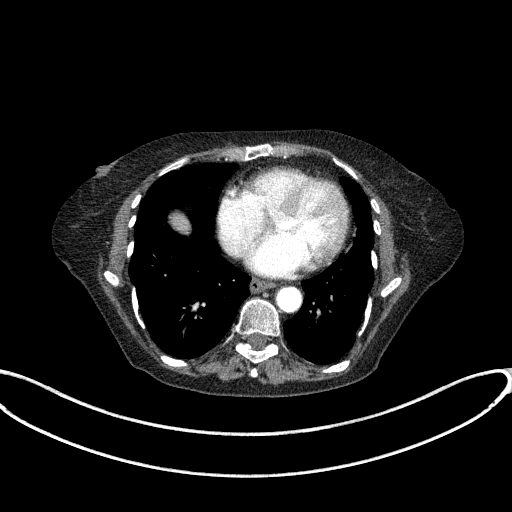
[im 75/149  soft-tissue]
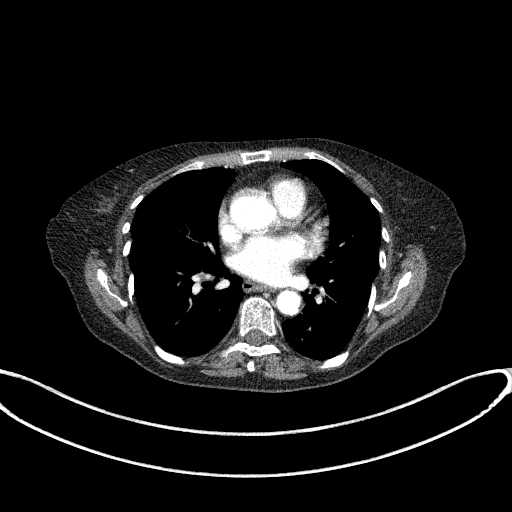
[im 75/149  bone]
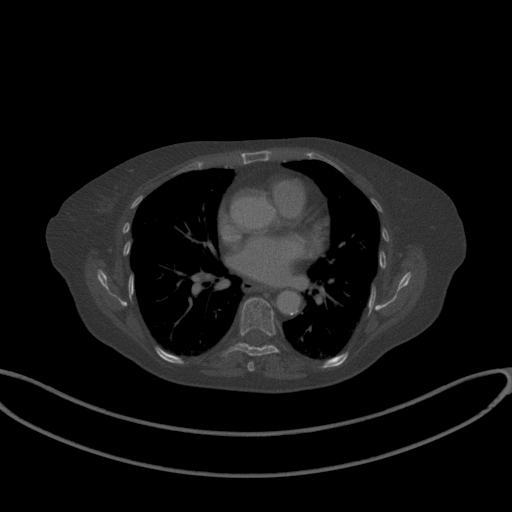
[im 89/149  soft-tissue]
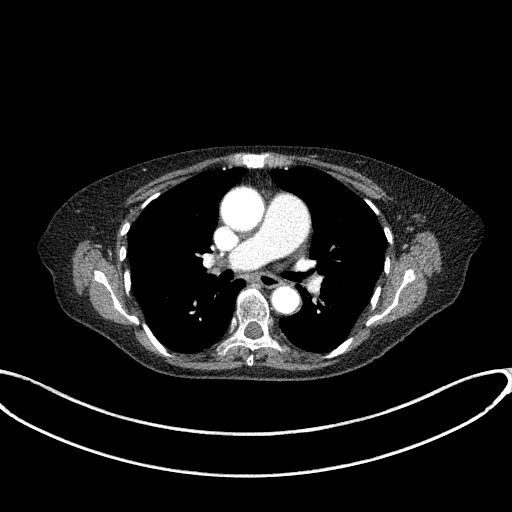
[im 104/149  soft-tissue]
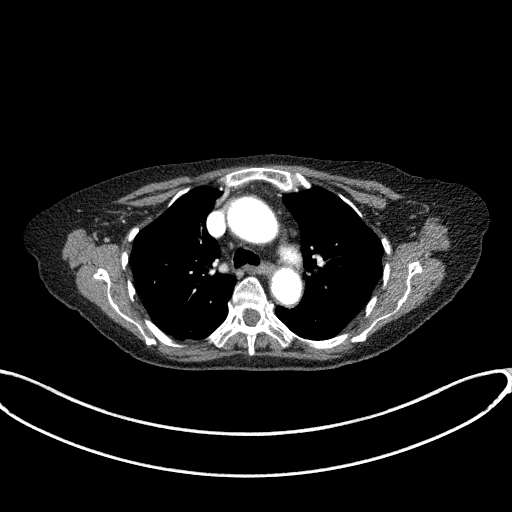
[im 119/149  soft-tissue]
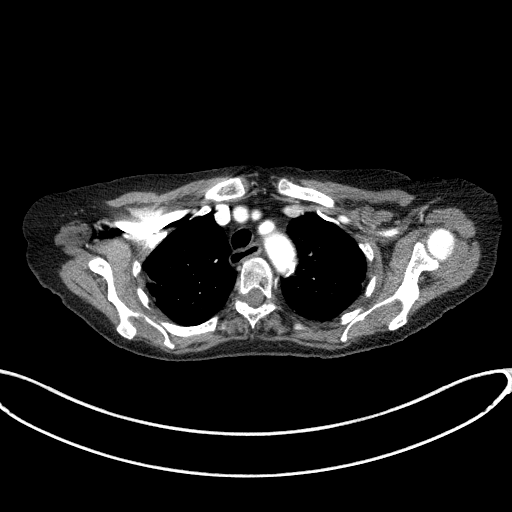
[im 134/149  soft-tissue]
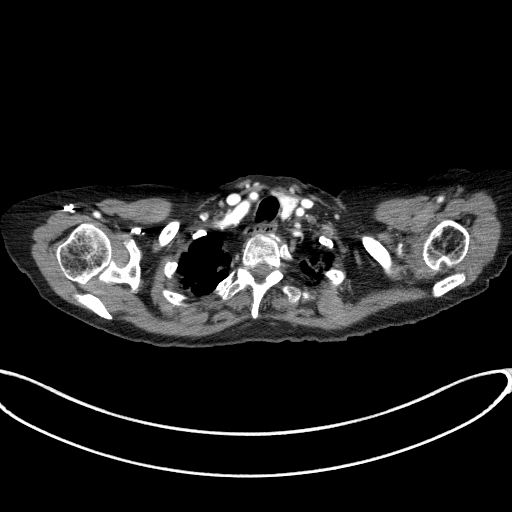
[im 134/149  bone]
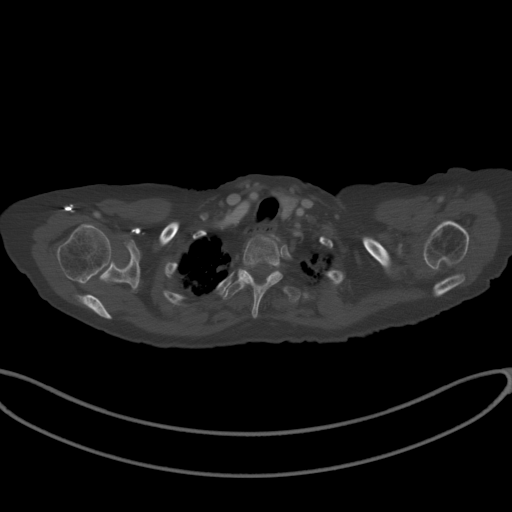

[9 of 16 positions shown; findings below may reference images not displayed]

Canned report from images found in remote index.

Refer to host system for actual result text.

## 2017-08-02 DIAGNOSIS — C785 Secondary malignant neoplasm of large intestine and rectum: Secondary | ICD-10-CM | POA: Diagnosis not present

## 2017-08-02 DIAGNOSIS — C7982 Secondary malignant neoplasm of genital organs: Secondary | ICD-10-CM | POA: Diagnosis not present

## 2017-08-02 DIAGNOSIS — Z85828 Personal history of other malignant neoplasm of skin: Secondary | ICD-10-CM | POA: Diagnosis not present

## 2017-08-08 DIAGNOSIS — E782 Mixed hyperlipidemia: Secondary | ICD-10-CM | POA: Diagnosis not present

## 2017-08-08 DIAGNOSIS — I10 Essential (primary) hypertension: Secondary | ICD-10-CM | POA: Diagnosis not present

## 2017-08-11 ENCOUNTER — Telehealth: Payer: Self-pay | Admitting: *Deleted

## 2017-08-11 NOTE — Telephone Encounter (Signed)
Pt called wrong office.  Pt meant to call PCP.

## 2017-08-12 DIAGNOSIS — E785 Hyperlipidemia, unspecified: Secondary | ICD-10-CM | POA: Diagnosis not present

## 2017-08-12 DIAGNOSIS — C269 Malignant neoplasm of ill-defined sites within the digestive system: Secondary | ICD-10-CM | POA: Diagnosis not present

## 2017-08-12 DIAGNOSIS — I509 Heart failure, unspecified: Secondary | ICD-10-CM | POA: Diagnosis not present

## 2017-08-12 DIAGNOSIS — E119 Type 2 diabetes mellitus without complications: Secondary | ICD-10-CM | POA: Diagnosis not present

## 2017-08-12 DIAGNOSIS — K625 Hemorrhage of anus and rectum: Secondary | ICD-10-CM | POA: Diagnosis not present

## 2017-08-12 DIAGNOSIS — K635 Polyp of colon: Secondary | ICD-10-CM | POA: Diagnosis not present

## 2017-08-12 DIAGNOSIS — I110 Hypertensive heart disease with heart failure: Secondary | ICD-10-CM | POA: Diagnosis not present

## 2017-08-12 DIAGNOSIS — R5383 Other fatigue: Secondary | ICD-10-CM | POA: Diagnosis not present

## 2017-08-12 DIAGNOSIS — Z882 Allergy status to sulfonamides status: Secondary | ICD-10-CM | POA: Diagnosis not present

## 2017-08-12 DIAGNOSIS — K219 Gastro-esophageal reflux disease without esophagitis: Secondary | ICD-10-CM | POA: Diagnosis not present

## 2017-08-12 DIAGNOSIS — Z510 Encounter for antineoplastic radiation therapy: Secondary | ICD-10-CM | POA: Diagnosis not present

## 2017-08-12 DIAGNOSIS — K629 Disease of anus and rectum, unspecified: Secondary | ICD-10-CM | POA: Diagnosis not present

## 2017-08-13 ENCOUNTER — Encounter: Payer: Self-pay | Admitting: Radiation Oncology

## 2017-08-18 DIAGNOSIS — I129 Hypertensive chronic kidney disease with stage 1 through stage 4 chronic kidney disease, or unspecified chronic kidney disease: Secondary | ICD-10-CM | POA: Diagnosis not present

## 2017-08-18 DIAGNOSIS — E782 Mixed hyperlipidemia: Secondary | ICD-10-CM | POA: Diagnosis not present

## 2017-08-18 DIAGNOSIS — K625 Hemorrhage of anus and rectum: Secondary | ICD-10-CM | POA: Diagnosis not present

## 2017-08-18 DIAGNOSIS — D631 Anemia in chronic kidney disease: Secondary | ICD-10-CM | POA: Diagnosis not present

## 2017-08-18 DIAGNOSIS — K219 Gastro-esophageal reflux disease without esophagitis: Secondary | ICD-10-CM | POA: Diagnosis not present

## 2017-08-19 DIAGNOSIS — E119 Type 2 diabetes mellitus without complications: Secondary | ICD-10-CM | POA: Diagnosis not present

## 2017-08-19 DIAGNOSIS — I509 Heart failure, unspecified: Secondary | ICD-10-CM | POA: Diagnosis not present

## 2017-08-19 DIAGNOSIS — K219 Gastro-esophageal reflux disease without esophagitis: Secondary | ICD-10-CM | POA: Diagnosis not present

## 2017-08-19 DIAGNOSIS — Z882 Allergy status to sulfonamides status: Secondary | ICD-10-CM | POA: Diagnosis not present

## 2017-08-19 DIAGNOSIS — K635 Polyp of colon: Secondary | ICD-10-CM | POA: Diagnosis not present

## 2017-08-19 DIAGNOSIS — K625 Hemorrhage of anus and rectum: Secondary | ICD-10-CM | POA: Diagnosis not present

## 2017-08-19 DIAGNOSIS — R5383 Other fatigue: Secondary | ICD-10-CM | POA: Diagnosis not present

## 2017-08-19 DIAGNOSIS — C269 Malignant neoplasm of ill-defined sites within the digestive system: Secondary | ICD-10-CM | POA: Diagnosis not present

## 2017-08-19 DIAGNOSIS — Z510 Encounter for antineoplastic radiation therapy: Secondary | ICD-10-CM | POA: Diagnosis not present

## 2017-08-19 DIAGNOSIS — E785 Hyperlipidemia, unspecified: Secondary | ICD-10-CM | POA: Diagnosis not present

## 2017-08-19 DIAGNOSIS — I110 Hypertensive heart disease with heart failure: Secondary | ICD-10-CM | POA: Diagnosis not present

## 2017-08-19 DIAGNOSIS — C20 Malignant neoplasm of rectum: Secondary | ICD-10-CM | POA: Diagnosis not present

## 2017-08-20 ENCOUNTER — Ambulatory Visit
Admission: RE | Admit: 2017-08-20 | Discharge: 2017-08-20 | Disposition: A | Discharge status: Home / Self Care | Payer: Self-pay | Source: Ambulatory Visit | Attending: Radiation Oncology | Admitting: Radiation Oncology

## 2017-08-20 ENCOUNTER — Other Ambulatory Visit: Payer: Self-pay | Admitting: Radiation Oncology

## 2017-08-20 DIAGNOSIS — K625 Hemorrhage of anus and rectum: Secondary | ICD-10-CM | POA: Diagnosis not present

## 2017-08-20 DIAGNOSIS — E785 Hyperlipidemia, unspecified: Secondary | ICD-10-CM | POA: Diagnosis not present

## 2017-08-20 DIAGNOSIS — C763 Malignant neoplasm of pelvis: Secondary | ICD-10-CM

## 2017-08-20 DIAGNOSIS — C20 Malignant neoplasm of rectum: Secondary | ICD-10-CM | POA: Diagnosis not present

## 2017-08-20 DIAGNOSIS — I509 Heart failure, unspecified: Secondary | ICD-10-CM | POA: Diagnosis not present

## 2017-08-20 DIAGNOSIS — E119 Type 2 diabetes mellitus without complications: Secondary | ICD-10-CM | POA: Diagnosis not present

## 2017-08-20 DIAGNOSIS — I110 Hypertensive heart disease with heart failure: Secondary | ICD-10-CM | POA: Diagnosis not present

## 2017-08-20 DIAGNOSIS — Z882 Allergy status to sulfonamides status: Secondary | ICD-10-CM | POA: Diagnosis not present

## 2017-08-20 DIAGNOSIS — K219 Gastro-esophageal reflux disease without esophagitis: Secondary | ICD-10-CM | POA: Diagnosis not present

## 2017-08-20 DIAGNOSIS — Z510 Encounter for antineoplastic radiation therapy: Secondary | ICD-10-CM | POA: Diagnosis not present

## 2017-08-20 DIAGNOSIS — R5383 Other fatigue: Secondary | ICD-10-CM | POA: Diagnosis not present

## 2017-08-20 DIAGNOSIS — C269 Malignant neoplasm of ill-defined sites within the digestive system: Secondary | ICD-10-CM | POA: Diagnosis not present

## 2017-08-20 DIAGNOSIS — K635 Polyp of colon: Secondary | ICD-10-CM | POA: Diagnosis not present

## 2017-08-21 DIAGNOSIS — R5383 Other fatigue: Secondary | ICD-10-CM | POA: Diagnosis not present

## 2017-08-21 DIAGNOSIS — E785 Hyperlipidemia, unspecified: Secondary | ICD-10-CM | POA: Diagnosis not present

## 2017-08-21 DIAGNOSIS — K219 Gastro-esophageal reflux disease without esophagitis: Secondary | ICD-10-CM | POA: Diagnosis not present

## 2017-08-21 DIAGNOSIS — I110 Hypertensive heart disease with heart failure: Secondary | ICD-10-CM | POA: Diagnosis not present

## 2017-08-21 DIAGNOSIS — C269 Malignant neoplasm of ill-defined sites within the digestive system: Secondary | ICD-10-CM | POA: Diagnosis not present

## 2017-08-21 DIAGNOSIS — K625 Hemorrhage of anus and rectum: Secondary | ICD-10-CM | POA: Diagnosis not present

## 2017-08-21 DIAGNOSIS — K635 Polyp of colon: Secondary | ICD-10-CM | POA: Diagnosis not present

## 2017-08-21 DIAGNOSIS — Z882 Allergy status to sulfonamides status: Secondary | ICD-10-CM | POA: Diagnosis not present

## 2017-08-21 DIAGNOSIS — Z510 Encounter for antineoplastic radiation therapy: Secondary | ICD-10-CM | POA: Diagnosis not present

## 2017-08-21 DIAGNOSIS — E119 Type 2 diabetes mellitus without complications: Secondary | ICD-10-CM | POA: Diagnosis not present

## 2017-08-21 DIAGNOSIS — C20 Malignant neoplasm of rectum: Secondary | ICD-10-CM | POA: Diagnosis not present

## 2017-08-21 DIAGNOSIS — I509 Heart failure, unspecified: Secondary | ICD-10-CM | POA: Diagnosis not present

## 2017-08-22 ENCOUNTER — Encounter (HOSPITAL_COMMUNITY): Admission: RE | Payer: Self-pay | Source: Ambulatory Visit

## 2017-08-22 ENCOUNTER — Ambulatory Visit (HOSPITAL_COMMUNITY): Admission: RE | Admit: 2017-08-22 | Payer: Medicare Other | Source: Ambulatory Visit | Admitting: Internal Medicine

## 2017-08-22 DIAGNOSIS — Z882 Allergy status to sulfonamides status: Secondary | ICD-10-CM | POA: Diagnosis not present

## 2017-08-22 DIAGNOSIS — K625 Hemorrhage of anus and rectum: Secondary | ICD-10-CM | POA: Diagnosis not present

## 2017-08-22 DIAGNOSIS — I509 Heart failure, unspecified: Secondary | ICD-10-CM | POA: Diagnosis not present

## 2017-08-22 DIAGNOSIS — Z510 Encounter for antineoplastic radiation therapy: Secondary | ICD-10-CM | POA: Diagnosis not present

## 2017-08-22 DIAGNOSIS — C269 Malignant neoplasm of ill-defined sites within the digestive system: Secondary | ICD-10-CM | POA: Diagnosis not present

## 2017-08-22 DIAGNOSIS — E785 Hyperlipidemia, unspecified: Secondary | ICD-10-CM | POA: Diagnosis not present

## 2017-08-22 DIAGNOSIS — I110 Hypertensive heart disease with heart failure: Secondary | ICD-10-CM | POA: Diagnosis not present

## 2017-08-22 DIAGNOSIS — R5383 Other fatigue: Secondary | ICD-10-CM | POA: Diagnosis not present

## 2017-08-22 DIAGNOSIS — K635 Polyp of colon: Secondary | ICD-10-CM | POA: Diagnosis not present

## 2017-08-22 DIAGNOSIS — K219 Gastro-esophageal reflux disease without esophagitis: Secondary | ICD-10-CM | POA: Diagnosis not present

## 2017-08-22 DIAGNOSIS — E119 Type 2 diabetes mellitus without complications: Secondary | ICD-10-CM | POA: Diagnosis not present

## 2017-08-22 SURGERY — COLONOSCOPY
Anesthesia: Moderate Sedation

## 2017-08-26 DIAGNOSIS — I509 Heart failure, unspecified: Secondary | ICD-10-CM | POA: Diagnosis not present

## 2017-08-26 DIAGNOSIS — E785 Hyperlipidemia, unspecified: Secondary | ICD-10-CM | POA: Diagnosis not present

## 2017-08-26 DIAGNOSIS — R5383 Other fatigue: Secondary | ICD-10-CM | POA: Diagnosis not present

## 2017-08-26 DIAGNOSIS — E119 Type 2 diabetes mellitus without complications: Secondary | ICD-10-CM | POA: Diagnosis not present

## 2017-08-26 DIAGNOSIS — C269 Malignant neoplasm of ill-defined sites within the digestive system: Secondary | ICD-10-CM | POA: Diagnosis not present

## 2017-08-26 DIAGNOSIS — K625 Hemorrhage of anus and rectum: Secondary | ICD-10-CM | POA: Diagnosis not present

## 2017-08-26 DIAGNOSIS — K635 Polyp of colon: Secondary | ICD-10-CM | POA: Diagnosis not present

## 2017-08-26 DIAGNOSIS — Z510 Encounter for antineoplastic radiation therapy: Secondary | ICD-10-CM | POA: Diagnosis not present

## 2017-08-26 DIAGNOSIS — Z882 Allergy status to sulfonamides status: Secondary | ICD-10-CM | POA: Diagnosis not present

## 2017-08-26 DIAGNOSIS — I110 Hypertensive heart disease with heart failure: Secondary | ICD-10-CM | POA: Diagnosis not present

## 2017-08-26 DIAGNOSIS — K219 Gastro-esophageal reflux disease without esophagitis: Secondary | ICD-10-CM | POA: Diagnosis not present

## 2017-08-27 ENCOUNTER — Ambulatory Visit: Payer: Medicare Other

## 2017-08-27 ENCOUNTER — Ambulatory Visit
Admission: RE | Admit: 2017-08-27 | Discharge: 2017-08-27 | Disposition: A | Discharge status: Home / Self Care | Payer: Medicare Other | Source: Ambulatory Visit | Attending: Radiation Oncology | Admitting: Radiation Oncology

## 2017-08-27 DIAGNOSIS — R5383 Other fatigue: Secondary | ICD-10-CM | POA: Diagnosis not present

## 2017-08-27 DIAGNOSIS — K625 Hemorrhage of anus and rectum: Secondary | ICD-10-CM | POA: Diagnosis not present

## 2017-08-27 DIAGNOSIS — I509 Heart failure, unspecified: Secondary | ICD-10-CM | POA: Diagnosis not present

## 2017-08-27 DIAGNOSIS — Z882 Allergy status to sulfonamides status: Secondary | ICD-10-CM | POA: Diagnosis not present

## 2017-08-27 DIAGNOSIS — K635 Polyp of colon: Secondary | ICD-10-CM | POA: Diagnosis not present

## 2017-08-27 DIAGNOSIS — C269 Malignant neoplasm of ill-defined sites within the digestive system: Secondary | ICD-10-CM | POA: Diagnosis not present

## 2017-08-27 DIAGNOSIS — I110 Hypertensive heart disease with heart failure: Secondary | ICD-10-CM | POA: Diagnosis not present

## 2017-08-27 DIAGNOSIS — E119 Type 2 diabetes mellitus without complications: Secondary | ICD-10-CM | POA: Diagnosis not present

## 2017-08-27 DIAGNOSIS — Z510 Encounter for antineoplastic radiation therapy: Secondary | ICD-10-CM | POA: Diagnosis not present

## 2017-08-27 DIAGNOSIS — E785 Hyperlipidemia, unspecified: Secondary | ICD-10-CM | POA: Diagnosis not present

## 2017-08-27 DIAGNOSIS — K219 Gastro-esophageal reflux disease without esophagitis: Secondary | ICD-10-CM | POA: Diagnosis not present

## 2017-08-28 DIAGNOSIS — Z882 Allergy status to sulfonamides status: Secondary | ICD-10-CM | POA: Diagnosis not present

## 2017-08-28 DIAGNOSIS — I509 Heart failure, unspecified: Secondary | ICD-10-CM | POA: Diagnosis not present

## 2017-08-28 DIAGNOSIS — R5383 Other fatigue: Secondary | ICD-10-CM | POA: Diagnosis not present

## 2017-08-28 DIAGNOSIS — K219 Gastro-esophageal reflux disease without esophagitis: Secondary | ICD-10-CM | POA: Diagnosis not present

## 2017-08-28 DIAGNOSIS — E119 Type 2 diabetes mellitus without complications: Secondary | ICD-10-CM | POA: Diagnosis not present

## 2017-08-28 DIAGNOSIS — Z510 Encounter for antineoplastic radiation therapy: Secondary | ICD-10-CM | POA: Diagnosis not present

## 2017-08-28 DIAGNOSIS — K625 Hemorrhage of anus and rectum: Secondary | ICD-10-CM | POA: Diagnosis not present

## 2017-08-28 DIAGNOSIS — I110 Hypertensive heart disease with heart failure: Secondary | ICD-10-CM | POA: Diagnosis not present

## 2017-08-28 DIAGNOSIS — K635 Polyp of colon: Secondary | ICD-10-CM | POA: Diagnosis not present

## 2017-08-28 DIAGNOSIS — C269 Malignant neoplasm of ill-defined sites within the digestive system: Secondary | ICD-10-CM | POA: Diagnosis not present

## 2017-08-28 DIAGNOSIS — E785 Hyperlipidemia, unspecified: Secondary | ICD-10-CM | POA: Diagnosis not present

## 2017-08-29 DIAGNOSIS — C20 Malignant neoplasm of rectum: Secondary | ICD-10-CM | POA: Diagnosis not present

## 2017-08-29 DIAGNOSIS — R5383 Other fatigue: Secondary | ICD-10-CM | POA: Diagnosis not present

## 2017-08-29 DIAGNOSIS — I110 Hypertensive heart disease with heart failure: Secondary | ICD-10-CM | POA: Diagnosis not present

## 2017-08-29 DIAGNOSIS — K219 Gastro-esophageal reflux disease without esophagitis: Secondary | ICD-10-CM | POA: Diagnosis not present

## 2017-08-29 DIAGNOSIS — C269 Malignant neoplasm of ill-defined sites within the digestive system: Secondary | ICD-10-CM | POA: Diagnosis not present

## 2017-08-29 DIAGNOSIS — E119 Type 2 diabetes mellitus without complications: Secondary | ICD-10-CM | POA: Diagnosis not present

## 2017-08-29 DIAGNOSIS — Z510 Encounter for antineoplastic radiation therapy: Secondary | ICD-10-CM | POA: Diagnosis not present

## 2017-08-29 DIAGNOSIS — E785 Hyperlipidemia, unspecified: Secondary | ICD-10-CM | POA: Diagnosis not present

## 2017-08-29 DIAGNOSIS — K635 Polyp of colon: Secondary | ICD-10-CM | POA: Diagnosis not present

## 2017-08-29 DIAGNOSIS — I509 Heart failure, unspecified: Secondary | ICD-10-CM | POA: Diagnosis not present

## 2017-08-29 DIAGNOSIS — Z882 Allergy status to sulfonamides status: Secondary | ICD-10-CM | POA: Diagnosis not present

## 2017-08-29 DIAGNOSIS — K625 Hemorrhage of anus and rectum: Secondary | ICD-10-CM | POA: Diagnosis not present

## 2017-09-01 DIAGNOSIS — C269 Malignant neoplasm of ill-defined sites within the digestive system: Secondary | ICD-10-CM | POA: Diagnosis not present

## 2017-09-01 DIAGNOSIS — I509 Heart failure, unspecified: Secondary | ICD-10-CM | POA: Diagnosis not present

## 2017-09-01 DIAGNOSIS — Z510 Encounter for antineoplastic radiation therapy: Secondary | ICD-10-CM | POA: Diagnosis not present

## 2017-09-01 DIAGNOSIS — K625 Hemorrhage of anus and rectum: Secondary | ICD-10-CM | POA: Diagnosis not present

## 2017-09-01 DIAGNOSIS — E119 Type 2 diabetes mellitus without complications: Secondary | ICD-10-CM | POA: Diagnosis not present

## 2017-09-01 DIAGNOSIS — Z882 Allergy status to sulfonamides status: Secondary | ICD-10-CM | POA: Diagnosis not present

## 2017-09-01 DIAGNOSIS — R5383 Other fatigue: Secondary | ICD-10-CM | POA: Diagnosis not present

## 2017-09-01 DIAGNOSIS — K635 Polyp of colon: Secondary | ICD-10-CM | POA: Diagnosis not present

## 2017-09-01 DIAGNOSIS — E785 Hyperlipidemia, unspecified: Secondary | ICD-10-CM | POA: Diagnosis not present

## 2017-09-01 DIAGNOSIS — I110 Hypertensive heart disease with heart failure: Secondary | ICD-10-CM | POA: Diagnosis not present

## 2017-09-01 DIAGNOSIS — K219 Gastro-esophageal reflux disease without esophagitis: Secondary | ICD-10-CM | POA: Diagnosis not present

## 2017-09-03 DIAGNOSIS — I110 Hypertensive heart disease with heart failure: Secondary | ICD-10-CM | POA: Diagnosis not present

## 2017-09-03 DIAGNOSIS — K625 Hemorrhage of anus and rectum: Secondary | ICD-10-CM | POA: Diagnosis not present

## 2017-09-03 DIAGNOSIS — C269 Malignant neoplasm of ill-defined sites within the digestive system: Secondary | ICD-10-CM | POA: Diagnosis not present

## 2017-09-03 DIAGNOSIS — K635 Polyp of colon: Secondary | ICD-10-CM | POA: Diagnosis not present

## 2017-09-03 DIAGNOSIS — Z882 Allergy status to sulfonamides status: Secondary | ICD-10-CM | POA: Diagnosis not present

## 2017-09-03 DIAGNOSIS — E785 Hyperlipidemia, unspecified: Secondary | ICD-10-CM | POA: Diagnosis not present

## 2017-09-03 DIAGNOSIS — Z510 Encounter for antineoplastic radiation therapy: Secondary | ICD-10-CM | POA: Diagnosis not present

## 2017-09-03 DIAGNOSIS — I509 Heart failure, unspecified: Secondary | ICD-10-CM | POA: Diagnosis not present

## 2017-09-03 DIAGNOSIS — R5383 Other fatigue: Secondary | ICD-10-CM | POA: Diagnosis not present

## 2017-09-03 DIAGNOSIS — K219 Gastro-esophageal reflux disease without esophagitis: Secondary | ICD-10-CM | POA: Diagnosis not present

## 2017-09-03 DIAGNOSIS — E119 Type 2 diabetes mellitus without complications: Secondary | ICD-10-CM | POA: Diagnosis not present

## 2017-09-04 DIAGNOSIS — R5383 Other fatigue: Secondary | ICD-10-CM | POA: Diagnosis not present

## 2017-09-04 DIAGNOSIS — K635 Polyp of colon: Secondary | ICD-10-CM | POA: Diagnosis not present

## 2017-09-04 DIAGNOSIS — Z882 Allergy status to sulfonamides status: Secondary | ICD-10-CM | POA: Diagnosis not present

## 2017-09-04 DIAGNOSIS — C269 Malignant neoplasm of ill-defined sites within the digestive system: Secondary | ICD-10-CM | POA: Diagnosis not present

## 2017-09-04 DIAGNOSIS — E785 Hyperlipidemia, unspecified: Secondary | ICD-10-CM | POA: Diagnosis not present

## 2017-09-04 DIAGNOSIS — I509 Heart failure, unspecified: Secondary | ICD-10-CM | POA: Diagnosis not present

## 2017-09-04 DIAGNOSIS — K625 Hemorrhage of anus and rectum: Secondary | ICD-10-CM | POA: Diagnosis not present

## 2017-09-04 DIAGNOSIS — K219 Gastro-esophageal reflux disease without esophagitis: Secondary | ICD-10-CM | POA: Diagnosis not present

## 2017-09-04 DIAGNOSIS — E119 Type 2 diabetes mellitus without complications: Secondary | ICD-10-CM | POA: Diagnosis not present

## 2017-09-04 DIAGNOSIS — Z510 Encounter for antineoplastic radiation therapy: Secondary | ICD-10-CM | POA: Diagnosis not present

## 2017-09-04 DIAGNOSIS — I110 Hypertensive heart disease with heart failure: Secondary | ICD-10-CM | POA: Diagnosis not present

## 2017-09-05 DIAGNOSIS — R5383 Other fatigue: Secondary | ICD-10-CM | POA: Diagnosis not present

## 2017-09-05 DIAGNOSIS — I110 Hypertensive heart disease with heart failure: Secondary | ICD-10-CM | POA: Diagnosis not present

## 2017-09-05 DIAGNOSIS — K219 Gastro-esophageal reflux disease without esophagitis: Secondary | ICD-10-CM | POA: Diagnosis not present

## 2017-09-05 DIAGNOSIS — I509 Heart failure, unspecified: Secondary | ICD-10-CM | POA: Diagnosis not present

## 2017-09-05 DIAGNOSIS — C269 Malignant neoplasm of ill-defined sites within the digestive system: Secondary | ICD-10-CM | POA: Diagnosis not present

## 2017-09-05 DIAGNOSIS — K625 Hemorrhage of anus and rectum: Secondary | ICD-10-CM | POA: Diagnosis not present

## 2017-09-05 DIAGNOSIS — E119 Type 2 diabetes mellitus without complications: Secondary | ICD-10-CM | POA: Diagnosis not present

## 2017-09-05 DIAGNOSIS — K635 Polyp of colon: Secondary | ICD-10-CM | POA: Diagnosis not present

## 2017-09-05 DIAGNOSIS — E785 Hyperlipidemia, unspecified: Secondary | ICD-10-CM | POA: Diagnosis not present

## 2017-09-05 DIAGNOSIS — Z882 Allergy status to sulfonamides status: Secondary | ICD-10-CM | POA: Diagnosis not present

## 2017-09-05 DIAGNOSIS — Z510 Encounter for antineoplastic radiation therapy: Secondary | ICD-10-CM | POA: Diagnosis not present

## 2017-09-08 DIAGNOSIS — K219 Gastro-esophageal reflux disease without esophagitis: Secondary | ICD-10-CM | POA: Diagnosis not present

## 2017-09-08 DIAGNOSIS — I110 Hypertensive heart disease with heart failure: Secondary | ICD-10-CM | POA: Diagnosis not present

## 2017-09-08 DIAGNOSIS — K625 Hemorrhage of anus and rectum: Secondary | ICD-10-CM | POA: Diagnosis not present

## 2017-09-08 DIAGNOSIS — Z510 Encounter for antineoplastic radiation therapy: Secondary | ICD-10-CM | POA: Diagnosis not present

## 2017-09-08 DIAGNOSIS — E119 Type 2 diabetes mellitus without complications: Secondary | ICD-10-CM | POA: Diagnosis not present

## 2017-09-08 DIAGNOSIS — K635 Polyp of colon: Secondary | ICD-10-CM | POA: Diagnosis not present

## 2017-09-08 DIAGNOSIS — R5383 Other fatigue: Secondary | ICD-10-CM | POA: Diagnosis not present

## 2017-09-08 DIAGNOSIS — E785 Hyperlipidemia, unspecified: Secondary | ICD-10-CM | POA: Diagnosis not present

## 2017-09-08 DIAGNOSIS — I509 Heart failure, unspecified: Secondary | ICD-10-CM | POA: Diagnosis not present

## 2017-09-08 DIAGNOSIS — C20 Malignant neoplasm of rectum: Secondary | ICD-10-CM | POA: Diagnosis not present

## 2017-09-08 DIAGNOSIS — C269 Malignant neoplasm of ill-defined sites within the digestive system: Secondary | ICD-10-CM | POA: Diagnosis not present

## 2017-09-08 DIAGNOSIS — Z882 Allergy status to sulfonamides status: Secondary | ICD-10-CM | POA: Diagnosis not present

## 2017-09-09 DIAGNOSIS — C269 Malignant neoplasm of ill-defined sites within the digestive system: Secondary | ICD-10-CM | POA: Diagnosis not present

## 2017-09-09 DIAGNOSIS — E119 Type 2 diabetes mellitus without complications: Secondary | ICD-10-CM | POA: Diagnosis not present

## 2017-09-09 DIAGNOSIS — I110 Hypertensive heart disease with heart failure: Secondary | ICD-10-CM | POA: Diagnosis not present

## 2017-09-09 DIAGNOSIS — E785 Hyperlipidemia, unspecified: Secondary | ICD-10-CM | POA: Diagnosis not present

## 2017-09-09 DIAGNOSIS — K635 Polyp of colon: Secondary | ICD-10-CM | POA: Diagnosis not present

## 2017-09-09 DIAGNOSIS — I509 Heart failure, unspecified: Secondary | ICD-10-CM | POA: Diagnosis not present

## 2017-09-09 DIAGNOSIS — R5383 Other fatigue: Secondary | ICD-10-CM | POA: Diagnosis not present

## 2017-09-09 DIAGNOSIS — K219 Gastro-esophageal reflux disease without esophagitis: Secondary | ICD-10-CM | POA: Diagnosis not present

## 2017-09-09 DIAGNOSIS — K625 Hemorrhage of anus and rectum: Secondary | ICD-10-CM | POA: Diagnosis not present

## 2017-09-09 DIAGNOSIS — Z510 Encounter for antineoplastic radiation therapy: Secondary | ICD-10-CM | POA: Diagnosis not present

## 2017-09-09 DIAGNOSIS — Z882 Allergy status to sulfonamides status: Secondary | ICD-10-CM | POA: Diagnosis not present

## 2017-09-10 DIAGNOSIS — C269 Malignant neoplasm of ill-defined sites within the digestive system: Secondary | ICD-10-CM | POA: Diagnosis not present

## 2017-09-10 DIAGNOSIS — Z510 Encounter for antineoplastic radiation therapy: Secondary | ICD-10-CM | POA: Diagnosis not present

## 2017-09-10 DIAGNOSIS — E785 Hyperlipidemia, unspecified: Secondary | ICD-10-CM | POA: Diagnosis not present

## 2017-09-10 DIAGNOSIS — Z882 Allergy status to sulfonamides status: Secondary | ICD-10-CM | POA: Diagnosis not present

## 2017-09-10 DIAGNOSIS — R5383 Other fatigue: Secondary | ICD-10-CM | POA: Diagnosis not present

## 2017-09-10 DIAGNOSIS — I110 Hypertensive heart disease with heart failure: Secondary | ICD-10-CM | POA: Diagnosis not present

## 2017-09-10 DIAGNOSIS — K635 Polyp of colon: Secondary | ICD-10-CM | POA: Diagnosis not present

## 2017-09-10 DIAGNOSIS — K625 Hemorrhage of anus and rectum: Secondary | ICD-10-CM | POA: Diagnosis not present

## 2017-09-10 DIAGNOSIS — K219 Gastro-esophageal reflux disease without esophagitis: Secondary | ICD-10-CM | POA: Diagnosis not present

## 2017-09-10 DIAGNOSIS — E119 Type 2 diabetes mellitus without complications: Secondary | ICD-10-CM | POA: Diagnosis not present

## 2017-09-10 DIAGNOSIS — I509 Heart failure, unspecified: Secondary | ICD-10-CM | POA: Diagnosis not present

## 2017-09-11 DIAGNOSIS — K635 Polyp of colon: Secondary | ICD-10-CM | POA: Diagnosis not present

## 2017-09-11 DIAGNOSIS — K625 Hemorrhage of anus and rectum: Secondary | ICD-10-CM | POA: Diagnosis not present

## 2017-09-11 DIAGNOSIS — E785 Hyperlipidemia, unspecified: Secondary | ICD-10-CM | POA: Diagnosis not present

## 2017-09-11 DIAGNOSIS — E119 Type 2 diabetes mellitus without complications: Secondary | ICD-10-CM | POA: Diagnosis not present

## 2017-09-11 DIAGNOSIS — C269 Malignant neoplasm of ill-defined sites within the digestive system: Secondary | ICD-10-CM | POA: Diagnosis not present

## 2017-09-11 DIAGNOSIS — Z882 Allergy status to sulfonamides status: Secondary | ICD-10-CM | POA: Diagnosis not present

## 2017-09-11 DIAGNOSIS — K219 Gastro-esophageal reflux disease without esophagitis: Secondary | ICD-10-CM | POA: Diagnosis not present

## 2017-09-11 DIAGNOSIS — Z510 Encounter for antineoplastic radiation therapy: Secondary | ICD-10-CM | POA: Diagnosis not present

## 2017-09-11 DIAGNOSIS — R5383 Other fatigue: Secondary | ICD-10-CM | POA: Diagnosis not present

## 2017-09-11 DIAGNOSIS — I110 Hypertensive heart disease with heart failure: Secondary | ICD-10-CM | POA: Diagnosis not present

## 2017-09-11 DIAGNOSIS — I509 Heart failure, unspecified: Secondary | ICD-10-CM | POA: Diagnosis not present

## 2017-09-12 DIAGNOSIS — E785 Hyperlipidemia, unspecified: Secondary | ICD-10-CM | POA: Diagnosis not present

## 2017-09-12 DIAGNOSIS — Z882 Allergy status to sulfonamides status: Secondary | ICD-10-CM | POA: Diagnosis not present

## 2017-09-12 DIAGNOSIS — K219 Gastro-esophageal reflux disease without esophagitis: Secondary | ICD-10-CM | POA: Diagnosis not present

## 2017-09-12 DIAGNOSIS — Z510 Encounter for antineoplastic radiation therapy: Secondary | ICD-10-CM | POA: Diagnosis not present

## 2017-09-12 DIAGNOSIS — K625 Hemorrhage of anus and rectum: Secondary | ICD-10-CM | POA: Diagnosis not present

## 2017-09-12 DIAGNOSIS — I110 Hypertensive heart disease with heart failure: Secondary | ICD-10-CM | POA: Diagnosis not present

## 2017-09-12 DIAGNOSIS — K635 Polyp of colon: Secondary | ICD-10-CM | POA: Diagnosis not present

## 2017-09-12 DIAGNOSIS — R5383 Other fatigue: Secondary | ICD-10-CM | POA: Diagnosis not present

## 2017-09-12 DIAGNOSIS — I509 Heart failure, unspecified: Secondary | ICD-10-CM | POA: Diagnosis not present

## 2017-09-12 DIAGNOSIS — E119 Type 2 diabetes mellitus without complications: Secondary | ICD-10-CM | POA: Diagnosis not present

## 2017-09-12 DIAGNOSIS — C269 Malignant neoplasm of ill-defined sites within the digestive system: Secondary | ICD-10-CM | POA: Diagnosis not present

## 2017-09-15 DIAGNOSIS — K219 Gastro-esophageal reflux disease without esophagitis: Secondary | ICD-10-CM | POA: Diagnosis not present

## 2017-09-15 DIAGNOSIS — I110 Hypertensive heart disease with heart failure: Secondary | ICD-10-CM | POA: Diagnosis not present

## 2017-09-15 DIAGNOSIS — Z510 Encounter for antineoplastic radiation therapy: Secondary | ICD-10-CM | POA: Diagnosis not present

## 2017-09-15 DIAGNOSIS — R5383 Other fatigue: Secondary | ICD-10-CM | POA: Diagnosis not present

## 2017-09-15 DIAGNOSIS — K635 Polyp of colon: Secondary | ICD-10-CM | POA: Diagnosis not present

## 2017-09-15 DIAGNOSIS — I509 Heart failure, unspecified: Secondary | ICD-10-CM | POA: Diagnosis not present

## 2017-09-15 DIAGNOSIS — C269 Malignant neoplasm of ill-defined sites within the digestive system: Secondary | ICD-10-CM | POA: Diagnosis not present

## 2017-09-15 DIAGNOSIS — E785 Hyperlipidemia, unspecified: Secondary | ICD-10-CM | POA: Diagnosis not present

## 2017-09-15 DIAGNOSIS — Z882 Allergy status to sulfonamides status: Secondary | ICD-10-CM | POA: Diagnosis not present

## 2017-09-15 DIAGNOSIS — K625 Hemorrhage of anus and rectum: Secondary | ICD-10-CM | POA: Diagnosis not present

## 2017-09-15 DIAGNOSIS — C20 Malignant neoplasm of rectum: Secondary | ICD-10-CM | POA: Diagnosis not present

## 2017-09-15 DIAGNOSIS — E119 Type 2 diabetes mellitus without complications: Secondary | ICD-10-CM | POA: Diagnosis not present

## 2017-09-16 DIAGNOSIS — K635 Polyp of colon: Secondary | ICD-10-CM | POA: Diagnosis not present

## 2017-09-16 DIAGNOSIS — E119 Type 2 diabetes mellitus without complications: Secondary | ICD-10-CM | POA: Diagnosis not present

## 2017-09-16 DIAGNOSIS — K219 Gastro-esophageal reflux disease without esophagitis: Secondary | ICD-10-CM | POA: Diagnosis not present

## 2017-09-16 DIAGNOSIS — C269 Malignant neoplasm of ill-defined sites within the digestive system: Secondary | ICD-10-CM | POA: Diagnosis not present

## 2017-09-16 DIAGNOSIS — Z882 Allergy status to sulfonamides status: Secondary | ICD-10-CM | POA: Diagnosis not present

## 2017-09-16 DIAGNOSIS — K625 Hemorrhage of anus and rectum: Secondary | ICD-10-CM | POA: Diagnosis not present

## 2017-09-16 DIAGNOSIS — E785 Hyperlipidemia, unspecified: Secondary | ICD-10-CM | POA: Diagnosis not present

## 2017-09-16 DIAGNOSIS — I509 Heart failure, unspecified: Secondary | ICD-10-CM | POA: Diagnosis not present

## 2017-09-16 DIAGNOSIS — I110 Hypertensive heart disease with heart failure: Secondary | ICD-10-CM | POA: Diagnosis not present

## 2017-09-16 DIAGNOSIS — R5383 Other fatigue: Secondary | ICD-10-CM | POA: Diagnosis not present

## 2017-09-16 DIAGNOSIS — Z510 Encounter for antineoplastic radiation therapy: Secondary | ICD-10-CM | POA: Diagnosis not present

## 2017-09-17 DIAGNOSIS — R5383 Other fatigue: Secondary | ICD-10-CM | POA: Diagnosis not present

## 2017-09-17 DIAGNOSIS — E785 Hyperlipidemia, unspecified: Secondary | ICD-10-CM | POA: Diagnosis not present

## 2017-09-17 DIAGNOSIS — E119 Type 2 diabetes mellitus without complications: Secondary | ICD-10-CM | POA: Diagnosis not present

## 2017-09-17 DIAGNOSIS — I110 Hypertensive heart disease with heart failure: Secondary | ICD-10-CM | POA: Diagnosis not present

## 2017-09-17 DIAGNOSIS — K625 Hemorrhage of anus and rectum: Secondary | ICD-10-CM | POA: Diagnosis not present

## 2017-09-17 DIAGNOSIS — Z882 Allergy status to sulfonamides status: Secondary | ICD-10-CM | POA: Diagnosis not present

## 2017-09-17 DIAGNOSIS — I509 Heart failure, unspecified: Secondary | ICD-10-CM | POA: Diagnosis not present

## 2017-09-17 DIAGNOSIS — C269 Malignant neoplasm of ill-defined sites within the digestive system: Secondary | ICD-10-CM | POA: Diagnosis not present

## 2017-09-17 DIAGNOSIS — K219 Gastro-esophageal reflux disease without esophagitis: Secondary | ICD-10-CM | POA: Diagnosis not present

## 2017-09-17 DIAGNOSIS — K635 Polyp of colon: Secondary | ICD-10-CM | POA: Diagnosis not present

## 2017-09-17 DIAGNOSIS — Z510 Encounter for antineoplastic radiation therapy: Secondary | ICD-10-CM | POA: Diagnosis not present

## 2017-09-18 DIAGNOSIS — K219 Gastro-esophageal reflux disease without esophagitis: Secondary | ICD-10-CM | POA: Diagnosis not present

## 2017-09-18 DIAGNOSIS — C269 Malignant neoplasm of ill-defined sites within the digestive system: Secondary | ICD-10-CM | POA: Diagnosis not present

## 2017-09-18 DIAGNOSIS — K635 Polyp of colon: Secondary | ICD-10-CM | POA: Diagnosis not present

## 2017-09-18 DIAGNOSIS — I110 Hypertensive heart disease with heart failure: Secondary | ICD-10-CM | POA: Diagnosis not present

## 2017-09-18 DIAGNOSIS — K625 Hemorrhage of anus and rectum: Secondary | ICD-10-CM | POA: Diagnosis not present

## 2017-09-18 DIAGNOSIS — I509 Heart failure, unspecified: Secondary | ICD-10-CM | POA: Diagnosis not present

## 2017-09-18 DIAGNOSIS — Z882 Allergy status to sulfonamides status: Secondary | ICD-10-CM | POA: Diagnosis not present

## 2017-09-18 DIAGNOSIS — Z510 Encounter for antineoplastic radiation therapy: Secondary | ICD-10-CM | POA: Diagnosis not present

## 2017-09-18 DIAGNOSIS — R5383 Other fatigue: Secondary | ICD-10-CM | POA: Diagnosis not present

## 2017-09-18 DIAGNOSIS — E785 Hyperlipidemia, unspecified: Secondary | ICD-10-CM | POA: Diagnosis not present

## 2017-09-18 DIAGNOSIS — E119 Type 2 diabetes mellitus without complications: Secondary | ICD-10-CM | POA: Diagnosis not present

## 2017-09-19 DIAGNOSIS — Z510 Encounter for antineoplastic radiation therapy: Secondary | ICD-10-CM | POA: Diagnosis not present

## 2017-09-19 DIAGNOSIS — I509 Heart failure, unspecified: Secondary | ICD-10-CM | POA: Diagnosis not present

## 2017-09-19 DIAGNOSIS — K625 Hemorrhage of anus and rectum: Secondary | ICD-10-CM | POA: Diagnosis not present

## 2017-09-19 DIAGNOSIS — K219 Gastro-esophageal reflux disease without esophagitis: Secondary | ICD-10-CM | POA: Diagnosis not present

## 2017-09-19 DIAGNOSIS — C269 Malignant neoplasm of ill-defined sites within the digestive system: Secondary | ICD-10-CM | POA: Diagnosis not present

## 2017-09-19 DIAGNOSIS — I110 Hypertensive heart disease with heart failure: Secondary | ICD-10-CM | POA: Diagnosis not present

## 2017-09-19 DIAGNOSIS — R5383 Other fatigue: Secondary | ICD-10-CM | POA: Diagnosis not present

## 2017-09-19 DIAGNOSIS — K635 Polyp of colon: Secondary | ICD-10-CM | POA: Diagnosis not present

## 2017-09-19 DIAGNOSIS — Z882 Allergy status to sulfonamides status: Secondary | ICD-10-CM | POA: Diagnosis not present

## 2017-09-19 DIAGNOSIS — E785 Hyperlipidemia, unspecified: Secondary | ICD-10-CM | POA: Diagnosis not present

## 2017-09-19 DIAGNOSIS — E119 Type 2 diabetes mellitus without complications: Secondary | ICD-10-CM | POA: Diagnosis not present

## 2017-10-17 DIAGNOSIS — E875 Hyperkalemia: Secondary | ICD-10-CM | POA: Diagnosis not present

## 2017-10-17 DIAGNOSIS — R300 Dysuria: Secondary | ICD-10-CM | POA: Diagnosis not present

## 2017-10-17 DIAGNOSIS — D509 Iron deficiency anemia, unspecified: Secondary | ICD-10-CM | POA: Diagnosis not present

## 2017-10-17 DIAGNOSIS — I10 Essential (primary) hypertension: Secondary | ICD-10-CM | POA: Diagnosis not present

## 2017-10-17 DIAGNOSIS — K219 Gastro-esophageal reflux disease without esophagitis: Secondary | ICD-10-CM | POA: Diagnosis not present

## 2017-10-17 DIAGNOSIS — D649 Anemia, unspecified: Secondary | ICD-10-CM | POA: Diagnosis not present

## 2017-10-29 DIAGNOSIS — K629 Disease of anus and rectum, unspecified: Secondary | ICD-10-CM | POA: Diagnosis not present

## 2017-10-29 DIAGNOSIS — I509 Heart failure, unspecified: Secondary | ICD-10-CM | POA: Diagnosis not present

## 2017-10-29 DIAGNOSIS — I110 Hypertensive heart disease with heart failure: Secondary | ICD-10-CM | POA: Diagnosis not present

## 2017-10-29 DIAGNOSIS — R5383 Other fatigue: Secondary | ICD-10-CM | POA: Diagnosis not present

## 2017-10-29 DIAGNOSIS — K635 Polyp of colon: Secondary | ICD-10-CM | POA: Diagnosis not present

## 2017-10-29 DIAGNOSIS — C269 Malignant neoplasm of ill-defined sites within the digestive system: Secondary | ICD-10-CM | POA: Diagnosis not present

## 2017-10-29 DIAGNOSIS — E119 Type 2 diabetes mellitus without complications: Secondary | ICD-10-CM | POA: Diagnosis not present

## 2017-10-29 DIAGNOSIS — Z510 Encounter for antineoplastic radiation therapy: Secondary | ICD-10-CM | POA: Diagnosis not present

## 2017-10-29 DIAGNOSIS — K625 Hemorrhage of anus and rectum: Secondary | ICD-10-CM | POA: Diagnosis not present

## 2017-10-29 DIAGNOSIS — K219 Gastro-esophageal reflux disease without esophagitis: Secondary | ICD-10-CM | POA: Diagnosis not present

## 2017-10-29 DIAGNOSIS — Z882 Allergy status to sulfonamides status: Secondary | ICD-10-CM | POA: Diagnosis not present

## 2017-10-29 DIAGNOSIS — E785 Hyperlipidemia, unspecified: Secondary | ICD-10-CM | POA: Diagnosis not present

## 2017-12-31 DIAGNOSIS — R1900 Intra-abdominal and pelvic swelling, mass and lump, unspecified site: Secondary | ICD-10-CM | POA: Diagnosis not present

## 2017-12-31 DIAGNOSIS — C269 Malignant neoplasm of ill-defined sites within the digestive system: Secondary | ICD-10-CM | POA: Diagnosis not present

## 2017-12-31 DIAGNOSIS — K6289 Other specified diseases of anus and rectum: Secondary | ICD-10-CM | POA: Diagnosis not present

## 2017-12-31 DIAGNOSIS — E041 Nontoxic single thyroid nodule: Secondary | ICD-10-CM | POA: Diagnosis not present

## 2017-12-31 DIAGNOSIS — C218 Malignant neoplasm of overlapping sites of rectum, anus and anal canal: Secondary | ICD-10-CM | POA: Diagnosis not present

## 2017-12-31 DIAGNOSIS — Z923 Personal history of irradiation: Secondary | ICD-10-CM | POA: Diagnosis not present

## 2018-01-30 DIAGNOSIS — L662 Folliculitis decalvans: Secondary | ICD-10-CM | POA: Diagnosis not present

## 2018-02-24 DIAGNOSIS — K219 Gastro-esophageal reflux disease without esophagitis: Secondary | ICD-10-CM | POA: Diagnosis not present

## 2018-02-24 DIAGNOSIS — E782 Mixed hyperlipidemia: Secondary | ICD-10-CM | POA: Diagnosis not present

## 2018-02-24 DIAGNOSIS — Z23 Encounter for immunization: Secondary | ICD-10-CM | POA: Diagnosis not present

## 2018-02-24 DIAGNOSIS — D631 Anemia in chronic kidney disease: Secondary | ICD-10-CM | POA: Diagnosis not present

## 2018-03-04 DIAGNOSIS — Z0000 Encounter for general adult medical examination without abnormal findings: Secondary | ICD-10-CM | POA: Diagnosis not present

## 2018-03-04 DIAGNOSIS — Z23 Encounter for immunization: Secondary | ICD-10-CM | POA: Diagnosis not present

## 2018-03-04 DIAGNOSIS — I10 Essential (primary) hypertension: Secondary | ICD-10-CM | POA: Diagnosis not present

## 2018-03-04 DIAGNOSIS — D638 Anemia in other chronic diseases classified elsewhere: Secondary | ICD-10-CM | POA: Diagnosis not present

## 2018-03-04 DIAGNOSIS — K625 Hemorrhage of anus and rectum: Secondary | ICD-10-CM | POA: Diagnosis not present

## 2018-04-15 DIAGNOSIS — E119 Type 2 diabetes mellitus without complications: Secondary | ICD-10-CM | POA: Diagnosis not present

## 2018-04-15 DIAGNOSIS — C269 Malignant neoplasm of ill-defined sites within the digestive system: Secondary | ICD-10-CM | POA: Diagnosis not present

## 2018-04-15 DIAGNOSIS — K6289 Other specified diseases of anus and rectum: Secondary | ICD-10-CM | POA: Diagnosis not present

## 2018-04-15 DIAGNOSIS — K635 Polyp of colon: Secondary | ICD-10-CM | POA: Diagnosis not present

## 2018-04-15 DIAGNOSIS — Z510 Encounter for antineoplastic radiation therapy: Secondary | ICD-10-CM | POA: Diagnosis not present

## 2018-04-15 DIAGNOSIS — E785 Hyperlipidemia, unspecified: Secondary | ICD-10-CM | POA: Diagnosis not present

## 2018-04-15 DIAGNOSIS — I110 Hypertensive heart disease with heart failure: Secondary | ICD-10-CM | POA: Diagnosis not present

## 2018-04-15 DIAGNOSIS — K219 Gastro-esophageal reflux disease without esophagitis: Secondary | ICD-10-CM | POA: Diagnosis not present

## 2018-04-15 DIAGNOSIS — R5383 Other fatigue: Secondary | ICD-10-CM | POA: Diagnosis not present

## 2018-04-15 DIAGNOSIS — I509 Heart failure, unspecified: Secondary | ICD-10-CM | POA: Diagnosis not present

## 2018-04-15 DIAGNOSIS — Z882 Allergy status to sulfonamides status: Secondary | ICD-10-CM | POA: Diagnosis not present

## 2018-04-15 DIAGNOSIS — K625 Hemorrhage of anus and rectum: Secondary | ICD-10-CM | POA: Diagnosis not present

## 2018-06-04 DIAGNOSIS — Z923 Personal history of irradiation: Secondary | ICD-10-CM | POA: Diagnosis not present

## 2018-06-04 DIAGNOSIS — I509 Heart failure, unspecified: Secondary | ICD-10-CM | POA: Diagnosis not present

## 2018-06-04 DIAGNOSIS — E119 Type 2 diabetes mellitus without complications: Secondary | ICD-10-CM | POA: Diagnosis not present

## 2018-06-04 DIAGNOSIS — E041 Nontoxic single thyroid nodule: Secondary | ICD-10-CM | POA: Diagnosis not present

## 2018-06-04 DIAGNOSIS — E785 Hyperlipidemia, unspecified: Secondary | ICD-10-CM | POA: Diagnosis not present

## 2018-06-04 DIAGNOSIS — K6289 Other specified diseases of anus and rectum: Secondary | ICD-10-CM | POA: Diagnosis not present

## 2018-06-04 DIAGNOSIS — Z882 Allergy status to sulfonamides status: Secondary | ICD-10-CM | POA: Diagnosis not present

## 2018-06-04 DIAGNOSIS — I110 Hypertensive heart disease with heart failure: Secondary | ICD-10-CM | POA: Diagnosis not present

## 2018-07-01 DIAGNOSIS — I10 Essential (primary) hypertension: Secondary | ICD-10-CM | POA: Diagnosis not present

## 2018-07-01 DIAGNOSIS — K219 Gastro-esophageal reflux disease without esophagitis: Secondary | ICD-10-CM | POA: Diagnosis not present

## 2018-07-01 DIAGNOSIS — E782 Mixed hyperlipidemia: Secondary | ICD-10-CM | POA: Diagnosis not present

## 2018-07-01 DIAGNOSIS — D631 Anemia in chronic kidney disease: Secondary | ICD-10-CM | POA: Diagnosis not present

## 2018-07-14 DIAGNOSIS — G4489 Other headache syndrome: Secondary | ICD-10-CM | POA: Diagnosis not present

## 2018-07-14 DIAGNOSIS — R42 Dizziness and giddiness: Secondary | ICD-10-CM | POA: Diagnosis not present

## 2018-08-07 DIAGNOSIS — Z0000 Encounter for general adult medical examination without abnormal findings: Secondary | ICD-10-CM | POA: Diagnosis not present

## 2018-08-17 DIAGNOSIS — I10 Essential (primary) hypertension: Secondary | ICD-10-CM | POA: Diagnosis not present

## 2018-08-26 DIAGNOSIS — D509 Iron deficiency anemia, unspecified: Secondary | ICD-10-CM | POA: Diagnosis not present

## 2018-08-26 DIAGNOSIS — K219 Gastro-esophageal reflux disease without esophagitis: Secondary | ICD-10-CM | POA: Diagnosis not present

## 2018-08-26 DIAGNOSIS — I10 Essential (primary) hypertension: Secondary | ICD-10-CM | POA: Diagnosis not present

## 2018-08-26 DIAGNOSIS — E875 Hyperkalemia: Secondary | ICD-10-CM | POA: Diagnosis not present

## 2018-09-04 DIAGNOSIS — N183 Chronic kidney disease, stage 3 (moderate): Secondary | ICD-10-CM | POA: Diagnosis not present

## 2018-09-04 DIAGNOSIS — K219 Gastro-esophageal reflux disease without esophagitis: Secondary | ICD-10-CM | POA: Diagnosis not present

## 2018-09-04 DIAGNOSIS — E782 Mixed hyperlipidemia: Secondary | ICD-10-CM | POA: Diagnosis not present

## 2018-09-04 DIAGNOSIS — D638 Anemia in other chronic diseases classified elsewhere: Secondary | ICD-10-CM | POA: Diagnosis not present

## 2018-09-04 DIAGNOSIS — I129 Hypertensive chronic kidney disease with stage 1 through stage 4 chronic kidney disease, or unspecified chronic kidney disease: Secondary | ICD-10-CM | POA: Diagnosis not present

## 2018-09-11 ENCOUNTER — Ambulatory Visit (HOSPITAL_COMMUNITY)
Admission: RE | Admit: 2018-09-11 | Discharge: 2018-09-11 | Disposition: A | Discharge status: Home / Self Care | Payer: Medicare Other | Source: Ambulatory Visit | Attending: Adult Health Nurse Practitioner | Admitting: Adult Health Nurse Practitioner

## 2018-09-11 ENCOUNTER — Other Ambulatory Visit (HOSPITAL_COMMUNITY): Payer: Self-pay | Admitting: Adult Health Nurse Practitioner

## 2018-09-11 ENCOUNTER — Other Ambulatory Visit: Payer: Self-pay

## 2018-09-11 DIAGNOSIS — M542 Cervicalgia: Secondary | ICD-10-CM

## 2018-09-11 IMAGING — DX CERVICAL SPINE - COMPLETE 4+ VIEW
6 series · 6 of 6 positions shown · non-contrast
Comparison: None.

CLINICAL DATA: Posterior neck pain for months.

EXAM:
CERVICAL SPINE - COMPLETE 4+ VIEW

[c-spine lat]
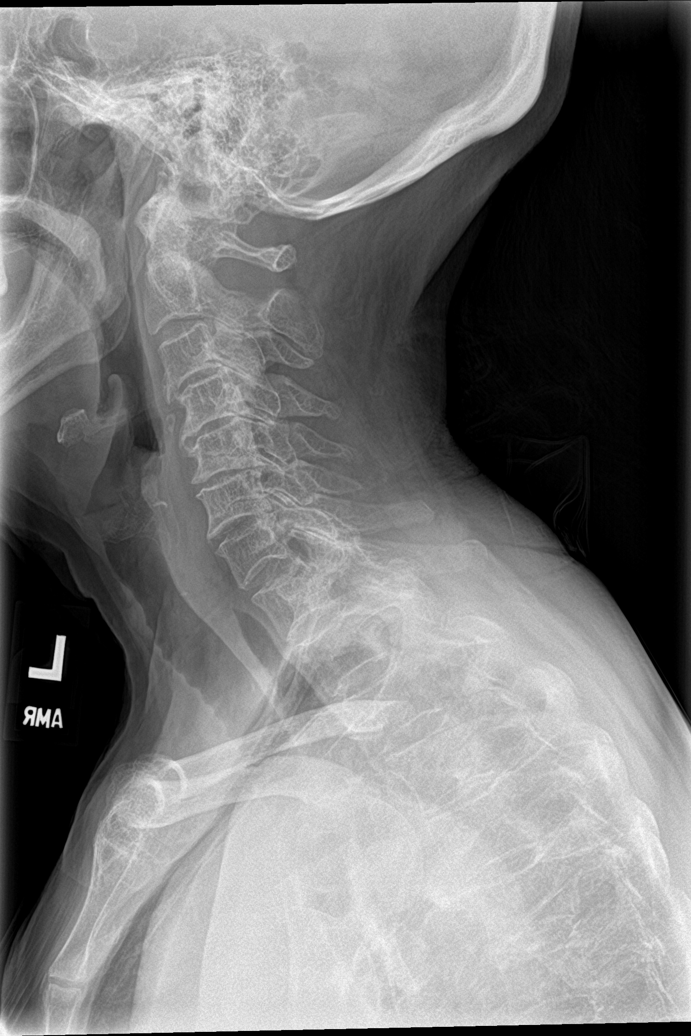

[c-spine obl (1 of 2)]
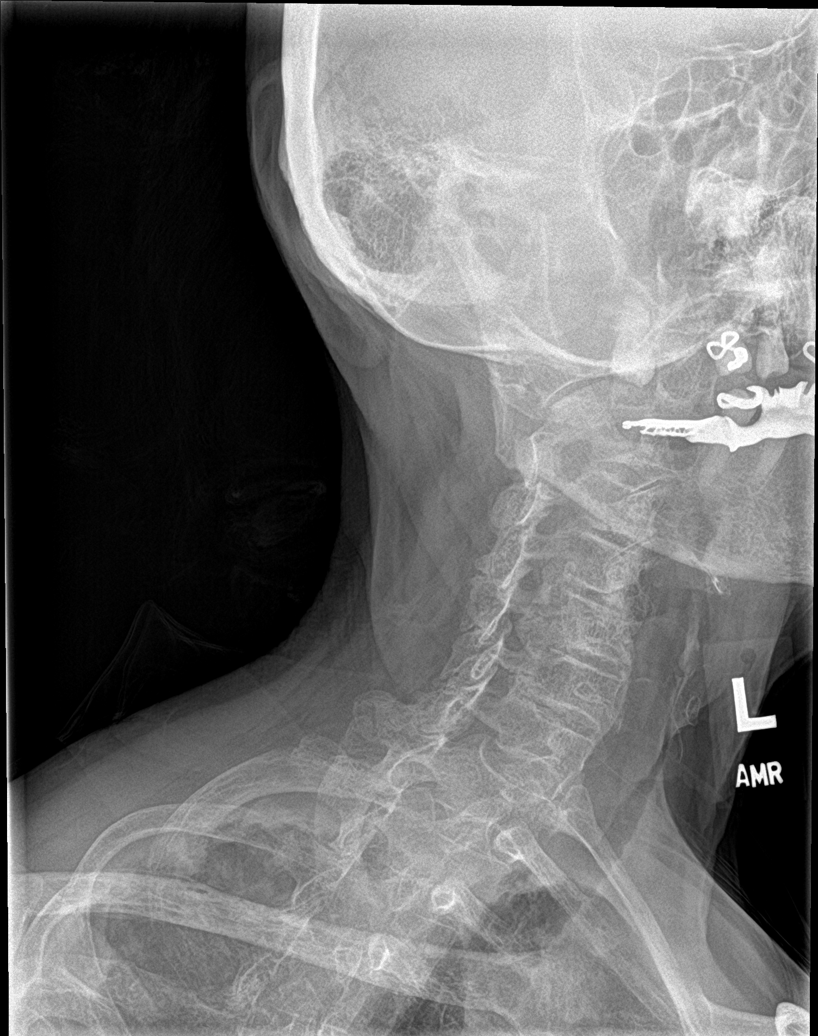

[c-spine obl (2 of 2)]
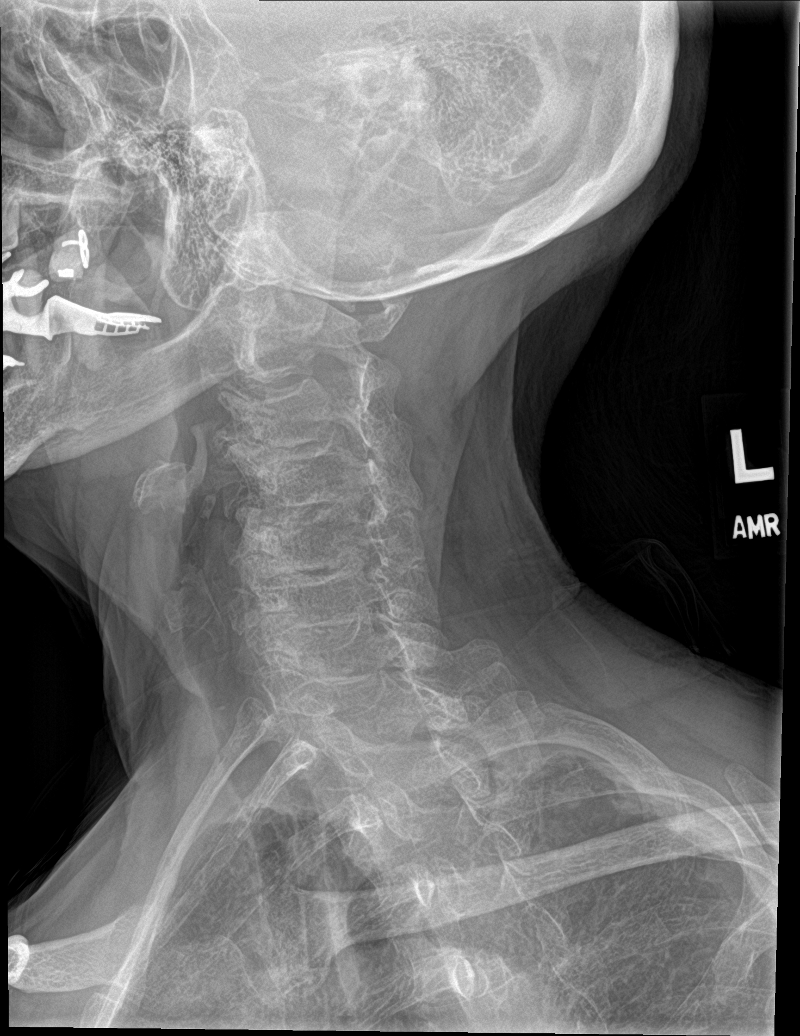

[c-spine ap]
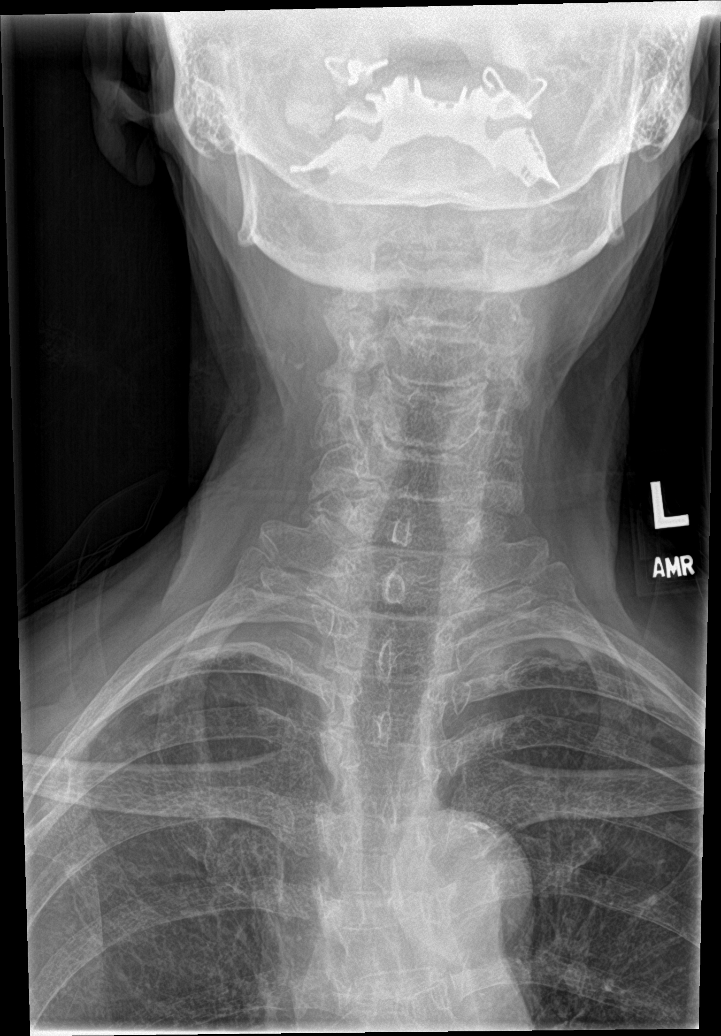

[c-spine open mouth (1 of 2)]
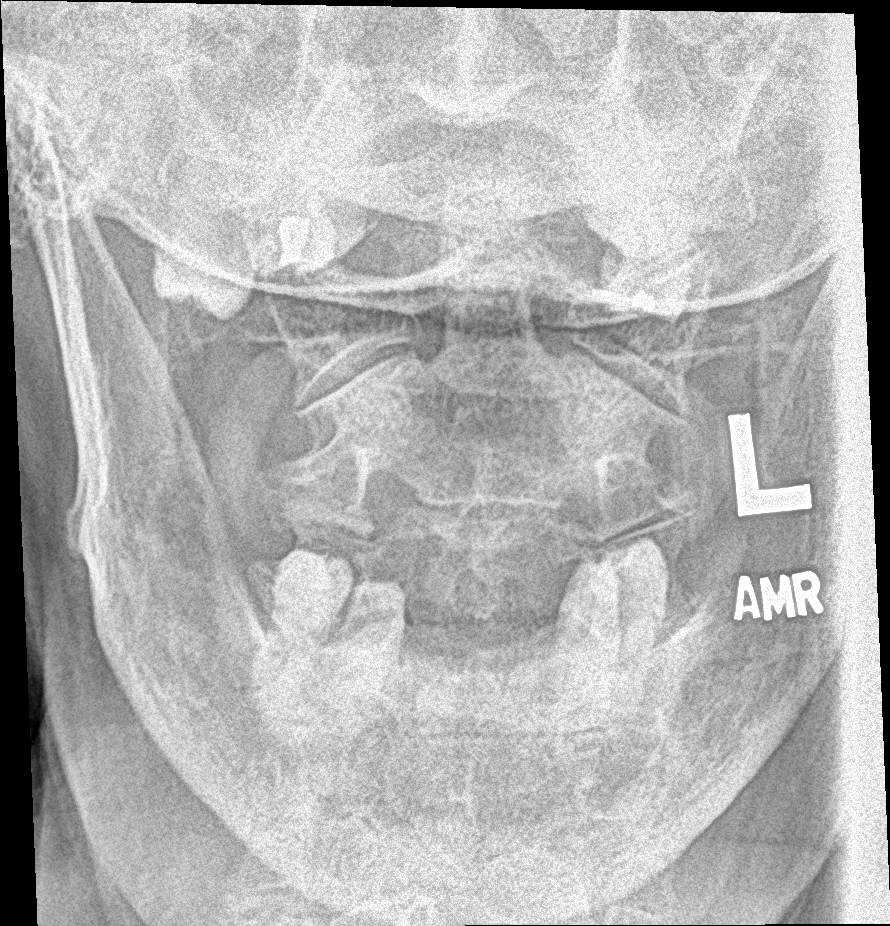

[c-spine open mouth (2 of 2)]
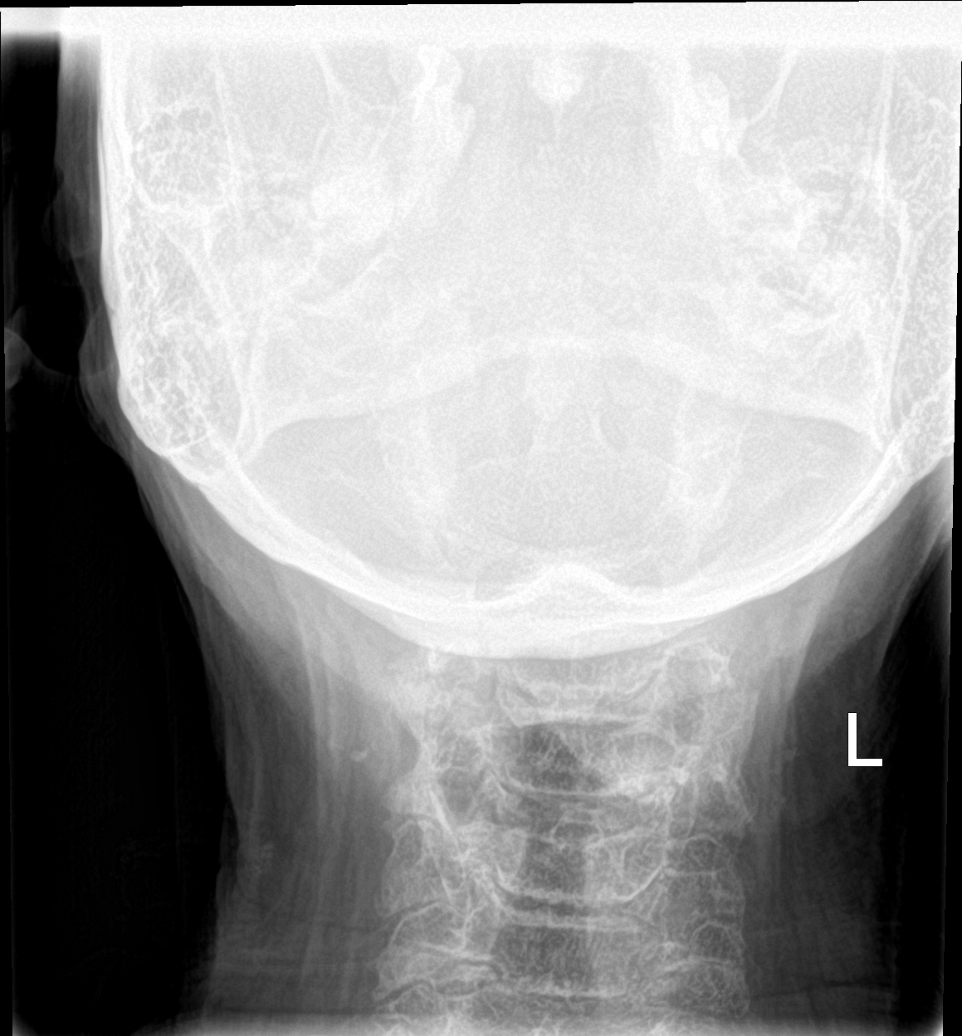

[6 of 6 positions shown; findings below may reference images not displayed]

FINDINGS: The pre odontoid space and prevertebral soft tissues are normal.
There is reversal of normal lordosis centered at C4. No other
malalignment. No fractures identified. Degenerative disc disease
seen most prominent at C3-4, C4-5, and C5-6. Small posterior
osteophytes are seen at least to the 5-6. Mild narrowing of
bilateral neural foramina on oblique views. No other abnormalities.
IMPRESSION: Mild narrowing of bilateral neural foramina at multiple levels on
oblique imaging. Degenerative disc disease with anterior and
posterior osteophytes.

## 2018-11-27 DIAGNOSIS — L03818 Cellulitis of other sites: Secondary | ICD-10-CM | POA: Diagnosis not present

## 2018-11-30 DIAGNOSIS — D509 Iron deficiency anemia, unspecified: Secondary | ICD-10-CM | POA: Diagnosis not present

## 2018-11-30 DIAGNOSIS — I10 Essential (primary) hypertension: Secondary | ICD-10-CM | POA: Diagnosis not present

## 2018-11-30 DIAGNOSIS — E875 Hyperkalemia: Secondary | ICD-10-CM | POA: Diagnosis not present

## 2018-11-30 DIAGNOSIS — K219 Gastro-esophageal reflux disease without esophagitis: Secondary | ICD-10-CM | POA: Diagnosis not present

## 2018-12-01 DIAGNOSIS — I10 Essential (primary) hypertension: Secondary | ICD-10-CM | POA: Diagnosis not present

## 2018-12-01 DIAGNOSIS — D509 Iron deficiency anemia, unspecified: Secondary | ICD-10-CM | POA: Diagnosis not present

## 2018-12-01 DIAGNOSIS — K219 Gastro-esophageal reflux disease without esophagitis: Secondary | ICD-10-CM | POA: Diagnosis not present

## 2018-12-01 DIAGNOSIS — E875 Hyperkalemia: Secondary | ICD-10-CM | POA: Diagnosis not present

## 2019-01-01 DIAGNOSIS — L03818 Cellulitis of other sites: Secondary | ICD-10-CM | POA: Diagnosis not present

## 2019-01-01 DIAGNOSIS — R197 Diarrhea, unspecified: Secondary | ICD-10-CM | POA: Diagnosis not present

## 2019-01-14 DIAGNOSIS — D509 Iron deficiency anemia, unspecified: Secondary | ICD-10-CM | POA: Diagnosis not present

## 2019-01-14 DIAGNOSIS — K219 Gastro-esophageal reflux disease without esophagitis: Secondary | ICD-10-CM | POA: Diagnosis not present

## 2019-01-14 DIAGNOSIS — I10 Essential (primary) hypertension: Secondary | ICD-10-CM | POA: Diagnosis not present

## 2019-01-14 DIAGNOSIS — E875 Hyperkalemia: Secondary | ICD-10-CM | POA: Diagnosis not present

## 2019-01-27 ENCOUNTER — Inpatient Hospital Stay (HOSPITAL_COMMUNITY): Payer: Medicare Other

## 2019-01-27 ENCOUNTER — Inpatient Hospital Stay (HOSPITAL_COMMUNITY)
Admission: EM | Admit: 2019-01-27 | Discharge: 2019-02-06 | DRG: 291 | Disposition: A | Discharge status: Home Health | Payer: Medicare Other | Attending: Family Medicine | Admitting: Family Medicine

## 2019-01-27 ENCOUNTER — Other Ambulatory Visit (HOSPITAL_COMMUNITY): Payer: Self-pay | Admitting: *Deleted

## 2019-01-27 ENCOUNTER — Other Ambulatory Visit: Payer: Self-pay

## 2019-01-27 ENCOUNTER — Emergency Department (HOSPITAL_COMMUNITY): Payer: Medicare Other

## 2019-01-27 ENCOUNTER — Encounter (HOSPITAL_COMMUNITY): Payer: Self-pay

## 2019-01-27 DIAGNOSIS — Z20828 Contact with and (suspected) exposure to other viral communicable diseases: Secondary | ICD-10-CM | POA: Diagnosis present

## 2019-01-27 DIAGNOSIS — I509 Heart failure, unspecified: Secondary | ICD-10-CM | POA: Diagnosis not present

## 2019-01-27 DIAGNOSIS — D500 Iron deficiency anemia secondary to blood loss (chronic): Secondary | ICD-10-CM | POA: Diagnosis not present

## 2019-01-27 DIAGNOSIS — Z79899 Other long term (current) drug therapy: Secondary | ICD-10-CM | POA: Diagnosis not present

## 2019-01-27 DIAGNOSIS — Z86010 Personal history of colonic polyps: Secondary | ICD-10-CM | POA: Diagnosis not present

## 2019-01-27 DIAGNOSIS — E785 Hyperlipidemia, unspecified: Secondary | ICD-10-CM | POA: Diagnosis present

## 2019-01-27 DIAGNOSIS — I10 Essential (primary) hypertension: Secondary | ICD-10-CM

## 2019-01-27 DIAGNOSIS — I5033 Acute on chronic diastolic (congestive) heart failure: Secondary | ICD-10-CM | POA: Diagnosis not present

## 2019-01-27 DIAGNOSIS — I4819 Other persistent atrial fibrillation: Secondary | ICD-10-CM | POA: Diagnosis not present

## 2019-01-27 DIAGNOSIS — N179 Acute kidney failure, unspecified: Secondary | ICD-10-CM | POA: Diagnosis not present

## 2019-01-27 DIAGNOSIS — Z7982 Long term (current) use of aspirin: Secondary | ICD-10-CM | POA: Diagnosis not present

## 2019-01-27 DIAGNOSIS — K260 Acute duodenal ulcer with hemorrhage: Secondary | ICD-10-CM | POA: Diagnosis present

## 2019-01-27 DIAGNOSIS — C210 Malignant neoplasm of anus, unspecified: Secondary | ICD-10-CM | POA: Diagnosis not present

## 2019-01-27 DIAGNOSIS — C52 Malignant neoplasm of vagina: Secondary | ICD-10-CM

## 2019-01-27 DIAGNOSIS — K922 Gastrointestinal hemorrhage, unspecified: Secondary | ICD-10-CM

## 2019-01-27 DIAGNOSIS — R0789 Other chest pain: Secondary | ICD-10-CM | POA: Diagnosis not present

## 2019-01-27 DIAGNOSIS — K264 Chronic or unspecified duodenal ulcer with hemorrhage: Secondary | ICD-10-CM | POA: Diagnosis present

## 2019-01-27 DIAGNOSIS — I5031 Acute diastolic (congestive) heart failure: Secondary | ICD-10-CM | POA: Diagnosis not present

## 2019-01-27 DIAGNOSIS — I482 Chronic atrial fibrillation, unspecified: Secondary | ICD-10-CM | POA: Diagnosis present

## 2019-01-27 DIAGNOSIS — I361 Nonrheumatic tricuspid (valve) insufficiency: Secondary | ICD-10-CM | POA: Diagnosis not present

## 2019-01-27 DIAGNOSIS — R079 Chest pain, unspecified: Secondary | ICD-10-CM | POA: Diagnosis not present

## 2019-01-27 DIAGNOSIS — Z85828 Personal history of other malignant neoplasm of skin: Secondary | ICD-10-CM

## 2019-01-27 DIAGNOSIS — E875 Hyperkalemia: Secondary | ICD-10-CM | POA: Diagnosis not present

## 2019-01-27 DIAGNOSIS — I499 Cardiac arrhythmia, unspecified: Secondary | ICD-10-CM | POA: Diagnosis not present

## 2019-01-27 DIAGNOSIS — I110 Hypertensive heart disease with heart failure: Secondary | ICD-10-CM | POA: Diagnosis not present

## 2019-01-27 DIAGNOSIS — Z888 Allergy status to other drugs, medicaments and biological substances status: Secondary | ICD-10-CM | POA: Diagnosis not present

## 2019-01-27 DIAGNOSIS — C218 Malignant neoplasm of overlapping sites of rectum, anus and anal canal: Secondary | ICD-10-CM | POA: Diagnosis not present

## 2019-01-27 DIAGNOSIS — R0602 Shortness of breath: Secondary | ICD-10-CM | POA: Diagnosis not present

## 2019-01-27 DIAGNOSIS — R0902 Hypoxemia: Secondary | ICD-10-CM | POA: Diagnosis not present

## 2019-01-27 DIAGNOSIS — I4891 Unspecified atrial fibrillation: Secondary | ICD-10-CM | POA: Diagnosis not present

## 2019-01-27 DIAGNOSIS — Z8711 Personal history of peptic ulcer disease: Secondary | ICD-10-CM

## 2019-01-27 DIAGNOSIS — Z882 Allergy status to sulfonamides status: Secondary | ICD-10-CM | POA: Diagnosis not present

## 2019-01-27 DIAGNOSIS — J9601 Acute respiratory failure with hypoxia: Secondary | ICD-10-CM | POA: Diagnosis not present

## 2019-01-27 DIAGNOSIS — Z885 Allergy status to narcotic agent status: Secondary | ICD-10-CM

## 2019-01-27 DIAGNOSIS — Z923 Personal history of irradiation: Secondary | ICD-10-CM

## 2019-01-27 DIAGNOSIS — I340 Nonrheumatic mitral (valve) insufficiency: Secondary | ICD-10-CM | POA: Diagnosis not present

## 2019-01-27 LAB — TSH: TSH: 1.746 u[IU]/mL (ref 0.350–4.500)

## 2019-01-27 LAB — URINALYSIS, ROUTINE W REFLEX MICROSCOPIC
Bilirubin Urine: NEGATIVE
Glucose, UA: NEGATIVE mg/dL
Ketones, ur: NEGATIVE mg/dL
Nitrite: NEGATIVE
Protein, ur: NEGATIVE mg/dL
Specific Gravity, Urine: 1.005 (ref 1.005–1.030)
pH: 7 (ref 5.0–8.0)

## 2019-01-27 LAB — COMPREHENSIVE METABOLIC PANEL
ALT: 11 U/L (ref 0–44)
AST: 12 U/L — ABNORMAL LOW (ref 15–41)
Albumin: 3.2 g/dL — ABNORMAL LOW (ref 3.5–5.0)
Alkaline Phosphatase: 81 U/L (ref 38–126)
Anion gap: 10 (ref 5–15)
BUN: 15 mg/dL (ref 8–23)
CO2: 23 mmol/L (ref 22–32)
Calcium: 8.7 mg/dL — ABNORMAL LOW (ref 8.9–10.3)
Chloride: 105 mmol/L (ref 98–111)
Creatinine, Ser: 1.03 mg/dL — ABNORMAL HIGH (ref 0.44–1.00)
GFR calc Af Amer: 56 mL/min — ABNORMAL LOW (ref >60)
GFR calc non Af Amer: 48 mL/min — ABNORMAL LOW (ref >60)
Glucose, Bld: 127 mg/dL — ABNORMAL HIGH (ref 70–99)
Potassium: 3.8 mmol/L (ref 3.5–5.1)
Sodium: 138 mmol/L (ref 135–145)
Total Bilirubin: 0.7 mg/dL (ref 0.3–1.2)
Total Protein: 7.1 g/dL (ref 6.5–8.1)

## 2019-01-27 LAB — CBC WITH DIFFERENTIAL/PLATELET
Abs Immature Granulocytes: 0.03 10*3/uL (ref 0.00–0.07)
Basophils Absolute: 0 10*3/uL (ref 0.0–0.1)
Basophils Relative: 0 %
Eosinophils Absolute: 0.2 10*3/uL (ref 0.0–0.5)
Eosinophils Relative: 2 %
HCT: 31.4 % — ABNORMAL LOW (ref 36.0–46.0)
Hemoglobin: 9.7 g/dL — ABNORMAL LOW (ref 12.0–15.0)
Immature Granulocytes: 0 %
Lymphocytes Relative: 7 %
Lymphs Abs: 0.8 10*3/uL (ref 0.7–4.0)
MCH: 29 pg (ref 26.0–34.0)
MCHC: 30.9 g/dL (ref 30.0–36.0)
MCV: 94 fL (ref 80.0–100.0)
Monocytes Absolute: 1.1 10*3/uL — ABNORMAL HIGH (ref 0.1–1.0)
Monocytes Relative: 10 %
Neutro Abs: 9.3 10*3/uL — ABNORMAL HIGH (ref 1.7–7.7)
Neutrophils Relative %: 81 %
Platelets: 292 10*3/uL (ref 150–400)
RBC: 3.34 MIL/uL — ABNORMAL LOW (ref 3.87–5.11)
RDW: 13.4 % (ref 11.5–15.5)
WBC: 11.5 10*3/uL — ABNORMAL HIGH (ref 4.0–10.5)
nRBC: 0 % (ref 0.0–0.2)

## 2019-01-27 LAB — SARS CORONAVIRUS 2 (TAT 6-24 HRS): SARS Coronavirus 2: NEGATIVE

## 2019-01-27 LAB — MAGNESIUM: Magnesium: 2 mg/dL (ref 1.7–2.4)

## 2019-01-27 LAB — MRSA PCR SCREENING: MRSA by PCR: NEGATIVE

## 2019-01-27 LAB — BRAIN NATRIURETIC PEPTIDE: B Natriuretic Peptide: 354 pg/mL — ABNORMAL HIGH (ref 0.0–100.0)

## 2019-01-27 IMAGING — CR DG CHEST 1V PORT
1 series · 1 of 1 positions shown · non-contrast
Comparison: PA and lateral chest [DATE].

CLINICAL DATA: Nausea and chest pain today.  Atrial fibrillation.

EXAM:
PORTABLE CHEST 1 VIEW

[portable]
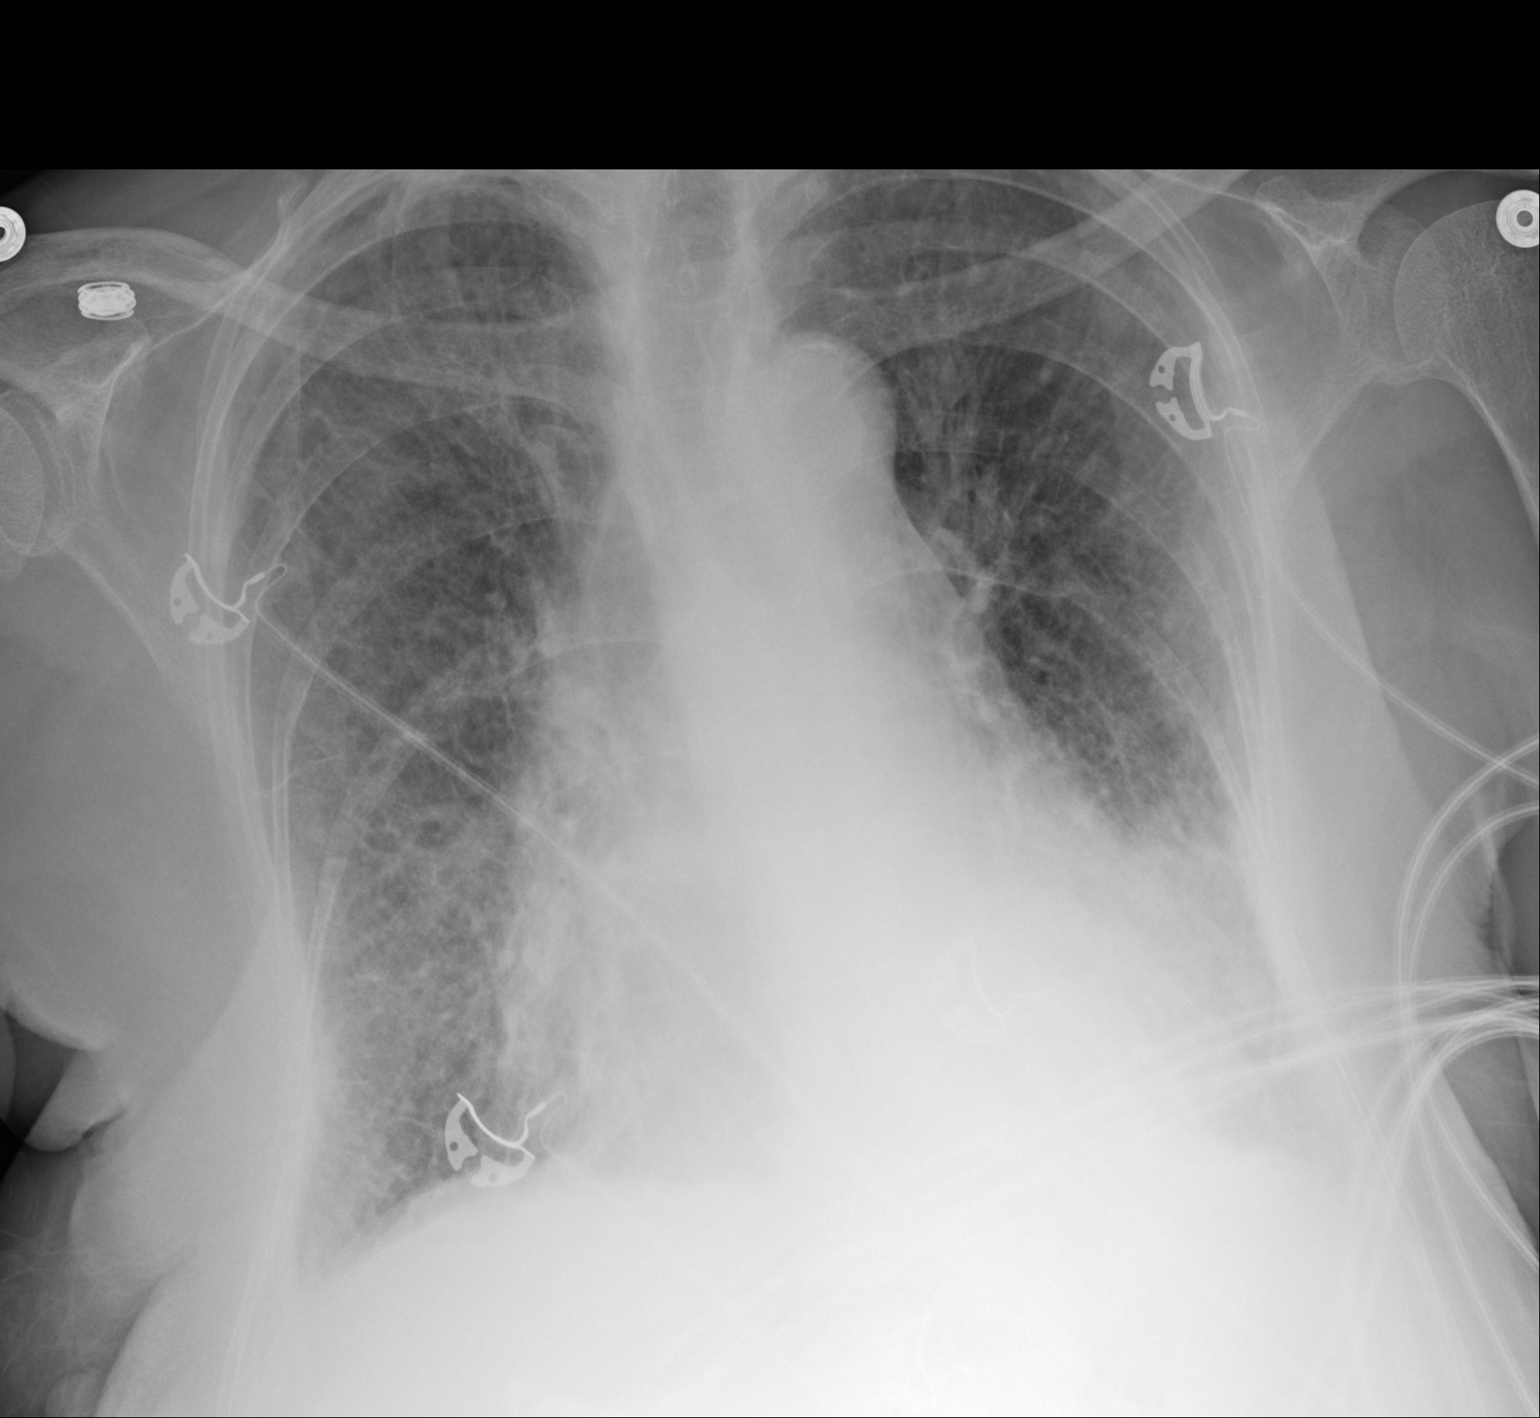

[1 of 1 positions shown; findings below may reference images not displayed]

FINDINGS: There is cardiomegaly and pulmonary edema. Small to moderate pleural
effusions are seen, larger on the left. Atherosclerosis noted. No
acute or focal bony abnormality.
IMPRESSION: Congestive heart failure.

## 2019-01-27 MED ORDER — ONDANSETRON HCL 4 MG/2ML IJ SOLN
4.0000 mg | Freq: Four times a day (QID) | INTRAMUSCULAR | Status: DC | PRN
  Administered 2019-01-28: 4 mg via INTRAVENOUS
  Filled 2019-01-27: qty 2

## 2019-01-27 MED ORDER — FUROSEMIDE 10 MG/ML IJ SOLN
40.0000 mg | Freq: Two times a day (BID) | INTRAMUSCULAR | Status: DC
  Administered 2019-01-27 – 2019-01-29 (×5): 40 mg via INTRAVENOUS
  Filled 2019-01-27 (×5): qty 4

## 2019-01-27 MED ORDER — FUROSEMIDE 10 MG/ML IJ SOLN
40.0000 mg | Freq: Once | INTRAMUSCULAR | Status: AC
  Administered 2019-01-27: 40 mg via INTRAVENOUS
  Filled 2019-01-27: qty 4

## 2019-01-27 MED ORDER — ALPRAZOLAM 0.25 MG PO TABS
0.2500 mg | ORAL_TABLET | Freq: Two times a day (BID) | ORAL | Status: DC | PRN
  Administered 2019-01-28 – 2019-02-05 (×8): 0.25 mg via ORAL
  Filled 2019-01-27 (×8): qty 1

## 2019-01-27 MED ORDER — LORATADINE 10 MG PO TABS
10.0000 mg | ORAL_TABLET | Freq: Every evening | ORAL | Status: DC
  Administered 2019-01-27 – 2019-02-05 (×10): 10 mg via ORAL
  Filled 2019-01-27 (×10): qty 1

## 2019-01-27 MED ORDER — ACETAMINOPHEN 650 MG RE SUPP: 650.0000 mg | Freq: Four times a day (QID) | RECTAL | Status: DC | PRN

## 2019-01-27 MED ORDER — ACETAMINOPHEN 325 MG PO TABS
650.0000 mg | ORAL_TABLET | Freq: Four times a day (QID) | ORAL | Status: DC | PRN
  Administered 2019-02-02 – 2019-02-04 (×2): 650 mg via ORAL
  Filled 2019-01-27 (×3): qty 2

## 2019-01-27 MED ORDER — LOSARTAN POTASSIUM 25 MG PO TABS: 25.0000 mg | ORAL_TABLET | Freq: Every day | ORAL | Status: DC

## 2019-01-27 MED ORDER — DILTIAZEM LOAD VIA INFUSION
10.0000 mg | Freq: Once | INTRAVENOUS | Status: AC
  Administered 2019-01-27: 10 mg via INTRAVENOUS
  Filled 2019-01-27: qty 10

## 2019-01-27 MED ORDER — SODIUM CHLORIDE 0.9% FLUSH
3.0000 mL | Freq: Two times a day (BID) | INTRAVENOUS | Status: DC
  Administered 2019-01-27 – 2019-02-05 (×16): 3 mL via INTRAVENOUS

## 2019-01-27 MED ORDER — SODIUM CHLORIDE 0.9% FLUSH: 3.0000 mL | INTRAVENOUS | Status: DC | PRN

## 2019-01-27 MED ORDER — ASPIRIN EC 81 MG PO TBEC
81.0000 mg | DELAYED_RELEASE_TABLET | Freq: Every day | ORAL | Status: DC
  Administered 2019-01-28 – 2019-02-01 (×5): 81 mg via ORAL
  Filled 2019-01-27 (×5): qty 1

## 2019-01-27 MED ORDER — ALBUTEROL SULFATE (2.5 MG/3ML) 0.083% IN NEBU: 2.5000 mg | INHALATION_SOLUTION | RESPIRATORY_TRACT | Status: DC | PRN

## 2019-01-27 MED ORDER — LEVOCETIRIZINE DIHYDROCHLORIDE 5 MG PO TABS: 5.0000 mg | ORAL_TABLET | Freq: Every evening | ORAL | Status: DC

## 2019-01-27 MED ORDER — PANTOPRAZOLE SODIUM 40 MG PO TBEC: 40.0000 mg | DELAYED_RELEASE_TABLET | Freq: Every day | ORAL | Status: DC | PRN

## 2019-01-27 MED ORDER — ORAL CARE MOUTH RINSE
15.0000 mL | Freq: Two times a day (BID) | OROMUCOSAL | Status: DC
  Administered 2019-01-27 – 2019-02-05 (×16): 15 mL via OROMUCOSAL

## 2019-01-27 MED ORDER — TRAZODONE HCL 50 MG PO TABS: 50.0000 mg | ORAL_TABLET | Freq: Every evening | ORAL | Status: DC | PRN

## 2019-01-27 MED ORDER — DILTIAZEM HCL-DEXTROSE 125-5 MG/125ML-% IV SOLN (PREMIX)
5.0000 mg/h | INTRAVENOUS | Status: DC
  Administered 2019-01-27: 20 mg/h via INTRAVENOUS
  Administered 2019-01-27: 5 mg/h via INTRAVENOUS
  Administered 2019-01-28: 02:00:00 10 mg/h via INTRAVENOUS
  Filled 2019-01-27 (×3): qty 125

## 2019-01-27 MED ORDER — POTASSIUM CHLORIDE CRYS ER 20 MEQ PO TBCR
20.0000 meq | EXTENDED_RELEASE_TABLET | Freq: Two times a day (BID) | ORAL | Status: DC
  Administered 2019-01-27 – 2019-02-04 (×17): 20 meq via ORAL
  Filled 2019-01-27 (×17): qty 1

## 2019-01-27 MED ORDER — HEPARIN SODIUM (PORCINE) 5000 UNIT/ML IJ SOLN
5000.0000 [IU] | Freq: Three times a day (TID) | INTRAMUSCULAR | Status: DC
  Administered 2019-01-27 – 2019-02-06 (×29): 5000 [IU] via SUBCUTANEOUS
  Filled 2019-01-27 (×29): qty 1

## 2019-01-27 MED ORDER — CHLORHEXIDINE GLUCONATE CLOTH 2 % EX PADS
6.0000 | MEDICATED_PAD | Freq: Every day | CUTANEOUS | Status: DC
  Administered 2019-01-28 – 2019-02-04 (×7): 6 via TOPICAL

## 2019-01-27 MED ORDER — LOSARTAN POTASSIUM 25 MG PO TABS: 50.0000 mg | ORAL_TABLET | Freq: Two times a day (BID) | ORAL | Status: DC

## 2019-01-27 MED ORDER — ONDANSETRON HCL 4 MG PO TABS: 4.0000 mg | ORAL_TABLET | Freq: Four times a day (QID) | ORAL | Status: DC | PRN

## 2019-01-27 MED ORDER — ONDANSETRON HCL 4 MG PO TABS: 4.0000 mg | ORAL_TABLET | Freq: Three times a day (TID) | ORAL | Status: DC | PRN

## 2019-01-27 MED ORDER — PANTOPRAZOLE SODIUM 40 MG PO TBEC
40.0000 mg | DELAYED_RELEASE_TABLET | Freq: Every day | ORAL | Status: DC
  Administered 2019-01-28 – 2019-02-06 (×10): 40 mg via ORAL
  Filled 2019-01-27 (×10): qty 1

## 2019-01-27 MED ORDER — DICYCLOMINE HCL 10 MG PO CAPS
10.0000 mg | ORAL_CAPSULE | ORAL | Status: DC | PRN
  Administered 2019-02-03: 10 mg via ORAL
  Filled 2019-01-27: qty 1

## 2019-01-27 MED ORDER — POLYETHYLENE GLYCOL 3350 17 G PO PACK: 17.0000 g | PACK | Freq: Every day | ORAL | Status: DC | PRN

## 2019-01-27 MED ORDER — SODIUM CHLORIDE 0.9 % IV SOLN: 250.0000 mL | INTRAVENOUS | Status: DC | PRN

## 2019-01-27 MED ORDER — MECLIZINE HCL 12.5 MG PO TABS: 25.0000 mg | ORAL_TABLET | Freq: Every day | ORAL | Status: DC | PRN

## 2019-01-27 MED ORDER — SODIUM CHLORIDE 0.9 % IV SOLN
INTRAVENOUS | Status: DC
  Administered 2019-01-27: 10:00:00 via INTRAVENOUS

## 2019-01-27 NOTE — ED Notes (Signed)
Pt was cleaned up and put fresh brief on. 

## 2019-01-27 NOTE — ED Notes (Signed)
Cardiologist at bedside at this time.

## 2019-01-27 NOTE — Consult Note (Signed)
Cardiology Consultation:   Patient ID: Renee Blake MRN: [id number]; DOB: 04-17-1928  Admit date: 01/27/2019 Date of Consult: 01/27/2019  Primary Care Provider: [name], MD Primary Cardiologist: No primary care provider on file.  Primary Electrophysiologist:  None    Patient Profile:   Renee Blake is a 85 y.o. female with a hx of hypertension, GI bleeding, duodenal ulcer, vaginal versus anorectal cancer, esophageal stricture, chronic anemia, and chronic diastolic heart failure who is being seen today for the evaluation of atrial fibrillation and CHF at the request of Dr. Denton Brick.  History of Present Illness:   Renee Blake is an 85 year old woman with a history of hiatal hernia, esophageal stricture, chronic diastolic heart failure, duodenal ulcer, vaginal versus anal rectal cancer, chronic anemia and hypertension who presented to the ED today with complaints of nausea and chest pain.  When EMS arrived she was found to be in rapid atrial fibrillation with heart rates in the 170-200 bpm range.  She has no prior history of atrial fibrillation.  She was given a 20 mg IV bolus of diltiazem by EMS and heart rates decreased to the 115-135 bpm range.  Chest pain subsided at that time.  She lives at home with her daughter, Renee Blake.  Her daughter, Renee Blake, was present at the time of my evaluation.  The patient said she has 6 children and several grandchildren.  It appears symptoms began during the night.  She had some sweating but denies any fevers.  She has been feeling weak for about a month.  Review of labs from today demonstrate following: Albumin low at 3.2, white blood cells elevated at 11.5, hemoglobin low at 9.7 which is roughly her baseline, BNP elevated at 354, normal TSH and normal magnesium.  Covid testing is pending at this time.  Chest x-ray shows CHF.  I personally reviewed ECG which demonstrates rapid atrial fibrillation, 152 bpm.  She was given 40 mg of IV Lasix and  started on IV Lasix 40 mg twice daily.  She was evaluated by Dr. Harl Bowie in March 2015 for acute diastolic heart failure.  She underwent a normal nuclear stress test on 06/09/2013.  She currently feels slightly cold but denies chest pain and palpitations.   Heart Pathway Score:     Past Medical History:  Diagnosis Date  . Acute blood loss anemia   . Chronic diarrhea   . Dilation of biliary tract    biliary duct per 02/26/13 CT  . Duodenal stricture   . Esophageal stricture   . Gastric ulcer   . GI bleed   . Hyperlipemia   . Hypertension   . Microscopic colitis   . Nephrolithiasis   . Pancreatic duct dilated 02/26/13  . Schatzki's ring   . Skin cancer   . Sliding hiatal hernia   . Vertigo     Past Surgical History:  Procedure Laterality Date  . BIOPSY  07/28/2017   Procedure: BIOPSY;  Surgeon: Rogene Houston, MD;  [facility];  Service: Endoscopy;;  rectum  . COLONOSCOPY N/A 07/28/2017   Procedure: COLONOSCOPY;  Surgeon: Rogene Houston, MD;  [facility];  Service: Endoscopy;  Laterality: N/A;  . ESOPHAGOGASTRODUODENOSCOPY N/A 02/27/2013   Procedure: ESOPHAGOGASTRODUODENOSCOPY (EGD);  Surgeon: Ladene Artist, MD;  Location: [facility] ENDOSCOPY;  Service: Endoscopy;  Laterality: N/A;  . ESOPHAGOGASTRODUODENOSCOPY N/A 03/28/2013   Procedure: ESOPHAGOGASTRODUODENOSCOPY (EGD);  Surgeon: Rogene Houston, MD;  [facility];  Service: Endoscopy;  Laterality: N/A;  .  HOT HEMOSTASIS N/A 02/27/2013   Procedure: HOT HEMOSTASIS (ARGON PLASMA COAGULATION/BICAP);  Surgeon: Ladene Artist, MD;  Location: [facility] ENDOSCOPY;  Service: Endoscopy;  Laterality: N/A;  . NOSE SURGERY     for skin cancer  . POLYPECTOMY  07/28/2017   Procedure: POLYPECTOMY;  Surgeon: Rogene Houston, MD;  [facility]: [facility];  Service: Endoscopy;;  colon      Home Medications:  Prior to Admission medications   Medication Sig Start Date End Date Taking? Authorizing Provider   ALPRAZolam (XANAX) 0.25 MG tablet Take 0.25 mg by mouth 2 (two) times daily as needed. for anxiety 02/17/15  Yes [provider]  amLODipine (NORVASC) 2.5 MG tablet Take 5 mg by mouth daily.    Yes [provider]  dicyclomine (BENTYL) 10 MG capsule Take 10 mg by mouth every 4 (four) hours as needed for spasms.   Yes [provider]  levocetirizine (XYZAL) 5 MG tablet Take 5 mg by mouth every evening.   Yes [provider]  losartan (COZAAR) 50 MG tablet Take 50 mg by mouth 2 (two) times daily.    Yes [provider]  meclizine (ANTIVERT) 25 MG tablet Take 25 mg by mouth daily as needed for dizziness.  07/11/17  Yes [provider]  ondansetron (ZOFRAN) 4 MG tablet Take 4 mg by mouth every 8 (eight) hours as needed for nausea or vomiting.   Yes [provider]  pantoprazole (PROTONIX) 40 MG tablet Take 40 mg by mouth daily as needed (reflux).    Yes [provider]  potassium citrate (UROCIT-K) 10 MEQ (1080 MG) SR tablet Take 20 mEq by mouth 2 (two) times daily.  08/05/18  Yes [provider]  aspirin EC 81 MG tablet Take 81 mg by mouth daily.    [provider]    Inpatient Medications: Scheduled Meds: . [START ON 01/28/2019] aspirin EC  81 mg Oral Daily  . furosemide  40 mg Intravenous BID  . heparin  5,000 Units Subcutaneous Q8H  . levocetirizine  5 mg Oral QPM  . [START ON 01/28/2019] losartan  25 mg Oral Daily  . potassium chloride SA  20 mEq Oral BID  . sodium chloride flush  3 mL Intravenous Q12H   Continuous Infusions: . sodium chloride    . diltiazem (CARDIZEM) infusion 20 mg/hr (01/27/19 1457)   PRN Meds: sodium chloride, acetaminophen **OR** acetaminophen, albuterol, ALPRAZolam, dicyclomine, meclizine, ondansetron **OR** ondansetron (ZOFRAN) IV, ondansetron, pantoprazole, polyethylene glycol, sodium chloride flush, traZODone  Allergies:    Allergies  Allergen Reactions  . Clonazepam  Other (See Comments)    Pruritis  . Codeine Nausea And Vomiting and Other (See Comments)    Reaction unknown  . Sulfa Antibiotics Other (See Comments)    Reactions unknown    Social History:   Social History   Socioeconomic History  . Marital status: Widowed    Spouse name: Not on file  . Number of children: Not on file  . Years of education: Not on file  . Highest education level: Not on file  Occupational History  . Not on file  Social Needs  . Financial resource strain: Not on file  . Food insecurity    Worry: Not on file    Inability: Not on file  . Transportation needs    Medical: Not on file    Non-medical: Not on file  Tobacco Use  . Smoking status: Never Smoker  . Smokeless tobacco: Never Used  Substance  and Sexual Activity  . Alcohol use: No    Alcohol/week: 0.0 standard drinks  . Drug use: No  . Sexual activity: Never  Lifestyle  . Physical activity    Days per week: Not on file    Minutes per session: Not on file  . Stress: Not on file  Relationships  . Social Herbalist on phone: Not on file    Gets together: Not on file    Attends religious service: Not on file    Active member of club or organization: Not on file    Attends meetings of clubs or organizations: Not on file    Relationship status: Not on file  . Intimate partner violence    Fear of current or ex partner: Not on file    Emotionally abused: Not on file    Physically abused: Not on file    Forced sexual activity: Not on file  Other Topics Concern  . Not on file  Social History Narrative  . Not on file    Family History:    Family History  Problem Relation Age of Onset  . Heart attack Father      ROS:  Please see the history of present illness.   All other ROS reviewed and negative.     Physical Exam/Data:   Vitals:   01/27/19 1400 01/27/19 1415 01/27/19 1430 01/27/19 1445  BP: 125/81 117/71 110/72 126/73  Pulse: (!) 122 (!) 120 (!) 117 (!) 115  Resp: (!) 30  (!) 30 (!) 27 (!) 28  Temp:      TempSrc:      SpO2: 95% 94% 94% 96%  Weight:      Height:        Intake/Output Summary (Last 24 hours) at 01/27/2019 1525 Last data filed at 01/27/2019 1301 Gross per 24 hour  Intake 410.42 ml  Output -  Net 410.42 ml   Last 3 Weights 01/27/2019 07/25/2017 07/25/2017  Weight (lbs) 115 lb 115 lb 14.4 oz 113 lb  Weight (kg) 52.164 kg 52.572 kg 51.256 kg     Body mass index is 18.56 kg/m.  General:  Well nourished, well developed, in no acute distress, frail, chronically ill-appearing HEENT: normal Lymph: no adenopathy Neck: no JVD Endocrine:  No thryomegaly Cardiac: Tachycardic, irregular, normal S1, S2; no S3; no murmur Lungs: Diminished breath sounds bilaterally Abd: soft, nontender, no hepatomegaly  Ext: no edema Musculoskeletal:  No deformities Skin: warm and dry  Neuro:  no focal abnormalities noted Psych:  Normal affect   EKG:  The EKG was personally reviewed and demonstrates: Reviewed above Telemetry:  Telemetry was personally reviewed and demonstrates: Atrial fibrillation, heart rates 115-120 bpm range  Relevant CV Studies: Echocardiogram 03/27/2013:  - Left ventricle: Mild posterior wall and moderate to severe  basal septal hypertrophy. No significant outflow tract  gradient. The cavity size was normal. Systolic function  was vigorous. The estimated ejection fraction was in the  range of 65% to 70%. Wall motion was normal; there were no  regional wall motion abnormalities. There was an increased  relative contribution of atrial contraction to ventricular  filling. Doppler parameters are consistent with abnormal  left ventricular relaxation (grade 1 diastolic  dysfunction). Doppler parameters are consistent with high  ventricular filling pressure.  - Mitral valve: Mildly thickened leaflets . Mild  regurgitation.  - Left atrium: The atrium was mildly to moderately dilated.  - Tricuspid valve: Mild  regurgitation.    Laboratory Data:  High Sensitivity Troponin:  No results for input(s): TROPONINIHS in the last 720 hours.   Chemistry Recent Labs  Lab 01/27/19 1006  NA 138  K 3.8  CL 105  CO2 23  GLUCOSE 127*  BUN 15  CREATININE 1.03*  CALCIUM 8.7*  GFRNONAA 48*  GFRAA 56*  ANIONGAP 10    Recent Labs  Lab 01/27/19 1006  PROT 7.1  ALBUMIN 3.2*  AST 12*  ALT 11  ALKPHOS 81  BILITOT 0.7   Hematology Recent Labs  Lab 01/27/19 1006  WBC 11.5*  RBC 3.34*  HGB 9.7*  HCT 31.4*  MCV 94.0  MCH 29.0  MCHC 30.9  RDW 13.4  PLT 292   BNP Recent Labs  Lab 01/27/19 1006  BNP 354.0*    DDimer No results for input(s): DDIMER in the last 168 hours.   Radiology/Studies:  Dg Chest Portable 1 View  Result Date: 01/27/2019 CLINICAL DATA:  Nausea and chest pain today.  Atrial fibrillation. EXAM: PORTABLE CHEST 1 VIEW COMPARISON:  PA and lateral chest 10/27/2014. FINDINGS: There is cardiomegaly and pulmonary edema. Small to moderate pleural effusions are seen, larger on the left. Atherosclerosis noted. No acute or focal bony abnormality. IMPRESSION: Congestive heart failure. Electronically Signed   By: Inge Rise M.D.   On: 01/27/2019 10:54    Assessment and Plan:   1.  Rapid atrial fibrillation: Currently on IV diltiazem infusion at 20 mg/h.  I would continue it and eventually transition her to short acting diltiazem.  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  She was started on aspirin by internal medicine.  I do not think she would be an ideal candidate for systemic anticoagulation.  I explained this to the patient and her daughter, Renee Blake, who are in agreement with this plan.  2.  Acute diastolic heart failure: Likely precipitated by rapid atrial fibrillation.  She was initially given 40 mg of IV Lasix and then started on IV  Lasix 40 mg twice daily.  An echocardiogram has been ordered and is pending.  I would wait until heart rates are adequately controlled prior to obtaining an echocardiogram to obtain a more accurate assessment of left ventricular function.  She had vigorous left ventricular systolic function by echocardiogram in December 2014.  3.  Hypertension: Blood pressure is currently normal.  This will need continued monitoring given need for IV diuresis.  4.  Iron deficiency anemia: Hemoglobin 9.7 which is roughly at her baseline.  She has been started on aspirin by internal medicine.  5.  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  She has previously undergone radiation therapy.    For questions or updates, please contact [place] Please consult [email or web address] for contact info under     Signed, Kate Sable, MD  01/27/2019 3:25 PM

## 2019-01-27 NOTE — ED Notes (Signed)
Verbal order from Dr. Denton Brick while at bedside to titrate Cardizem to 20mg /hr. Hospitalist (Dr. Denton Brick) and I at bedside explained pt's condition of Afib, CHF with chronic anemia due to hx of GI cancer to patient and her daughter and pt and daughter voiced understanding. PT and family also voiced understanding that pt would be admitted to [place] step down unit and seen by the cardiology team.

## 2019-01-27 NOTE — H&P (Signed)
Patient Demographics:    Renee Blake, is a 85 y.o. female  MRN: [id number]   DOB - 04-27-1928  Admit Date - 01/27/2019  Outpatient Primary MD for the patient is Renee Squibb, MD   Assessment & Plan:    Principal Problem:   Acute exacerbation of CHF (congestive heart failure) ([place]) Active Problems:   New onset a-fib with RVR   Iron deficiency anemia due to chronic blood loss---H/o Gi Bleed   H/o Prior Lower GI bleed (anorectal Ca)   H/o Prior duodenal ulcer with hemorrhage   Hypertension    1)New Onset Afib--- with heart rate up to the 170s, currently on IV Cardizem drip -We will switch to beta-blocker if EF is low on repeat echo -TSH-1.74, magnesium 2.0, potassium 3.8 -Echocardiogram pending -Cardiology consult pending  CHA2DS2- VASc score   is =  5 (age (4), HTN, CHF, Female)  Which is  equal to = 7.2 % annual risk of stroke -However patient HAS Bled score is elevated due to Age AND history of prior Major bleeding duodenal ulcer and prior lower GI bleed from an a rectal carcinoma with chronic iron deficiency anemia- secondary to presumed chronic ongoing GI blood loss -Continue aspirin for now -Patient will have further discussions with cardiology service and PCP regarding risk versus benefit of full anticoagulation  2)HFpEF--patient with history of prior chronic diastolic dysfunction CHF, repeat echo pending -Currently presenting with acute on chronic diastolic dysfunction CHF exacerbation due to A. fib with RVR -Chest x-ray with pulmonary edema noted, clinical exam supports CHF flareup, BNP is elevated --Continue IV Lasix for diuresis, -Monitor daily weights and fluid input and output -Low-salt diet  3)Chronic iron deficiency anemia--Hgb is 9.7 which is close to patient's baseline -No evidence of  significant ongoing bleeding at this time continue to monitor and transfuse as clinically indicated -history of prior Major bleeding duodenal ulcer and prior lower GI bleed from an a rectal carcinoma with chronic iron deficiency anemia- Presumed secondary to chronic ongoing GI blood loss  4)HTN--blood pressure somewhat soft due to A. fib with RVR, discontinue amlodipine, decrease losartan to 25 mg daily.  IV Cardizem as above #1  5)H/o Anorectal Carcinoma--- S/p prior Radiation-- recent PET scan Noted.  Patient gets her radiation oncology, GYN and medical oncology care from [facility] through [place] of care and advanced directives as well as CODE STATUS discussed with patient and patient's daughter Renee Blake) at bedside -Patient is a full code   With History of - Reviewed by me  Past Medical History:  Diagnosis Date  . Acute blood loss anemia   . Chronic diarrhea   . Dilation of biliary tract    biliary duct per 02/26/13 CT  . Duodenal stricture   . Esophageal stricture   . Gastric ulcer   . GI bleed   . Hyperlipemia   . Hypertension   . Microscopic colitis   . Nephrolithiasis   .  Pancreatic duct dilated 02/26/13  . Schatzki's ring   . Skin cancer   . Sliding hiatal hernia   . Vertigo       Past Surgical History:  Procedure Laterality Date  . BIOPSY  07/28/2017   Procedure: BIOPSY;  Surgeon: Rogene Houston, MD;  Location: [facility];  Service: Endoscopy;;  rectum  . COLONOSCOPY N/A 07/28/2017   Procedure: COLONOSCOPY;  Surgeon: Rogene Houston, MD;  Location: [facility];  Service: Endoscopy;  Laterality: N/A;  . ESOPHAGOGASTRODUODENOSCOPY N/A 02/27/2013   Procedure: ESOPHAGOGASTRODUODENOSCOPY (EGD);  Surgeon: Ladene Artist, MD;  Location: [facility] ENDOSCOPY;  Service: Endoscopy;  Laterality: N/A;  . ESOPHAGOGASTRODUODENOSCOPY N/A 03/28/2013   Procedure: ESOPHAGOGASTRODUODENOSCOPY (EGD);  Surgeon: Rogene Houston, MD;  Location: [facility];  Service:  Endoscopy;  Laterality: N/A;  . HOT HEMOSTASIS N/A 02/27/2013   Procedure: HOT HEMOSTASIS (ARGON PLASMA COAGULATION/BICAP);  Surgeon: Ladene Artist, MD;  Location: [facility] ENDOSCOPY;  Service: Endoscopy;  Laterality: N/A;  . NOSE SURGERY     for skin cancer  . POLYPECTOMY  07/28/2017   Procedure: POLYPECTOMY;  Surgeon: Rogene Houston, MD;  Location: [facility];  Service: Endoscopy;;  colon       Chief Complaint  Patient presents with  . Chest Pain      HPI:    Renee Blake  is a 85 y.o. 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 fib with RVR , EMS  treated her with iv Cardizem.  She had transient improvement of her heart rate from about 170 to  120.  -- In the ED patient remains in A. fib with RVR -She is short of breath, with chest discomfort and palpitations and dizziness -Patient's daughter Renee Blake) at bedside, additional history obtained  -in ED chest x-ray consistent with pulmonary edema -BNP elevated at 354, hemoglobin 9.7, which is close to baseline --TSH-1.74, magnesium 2.0, potassium 3.8 -Creatinine is 1.0 -COVID-19 testing pending RN Renee Blake at bedside during interview and exam   Review of systems:    In addition to the HPI above,   A full Review of  Systems was done, all other systems reviewed are negative except as noted above in HPI , .    Social History:  Reviewed by me    Social History   Tobacco Use  . Smoking status: Never Smoker  . Smokeless tobacco: Never Used  Substance Use Topics  . Alcohol use: No    Alcohol/week: 0.0 standard drinks     Family History :  Reviewed by me    Family History  Problem Relation Age of Onset  . Heart  attack Father     Home Medications:   Prior to Admission medications   Medication Sig Start Date End Date Taking? Authorizing Provider  ALPRAZolam (XANAX) 0.25 MG tablet Take 0.25 mg by mouth 2 (two) times daily as needed. for anxiety 02/17/15  Yes [provider]  amLODipine (NORVASC) 2.5 MG tablet Take 5 mg by mouth daily.    Yes [provider]  dicyclomine (BENTYL) 10 MG capsule Take 10 mg by mouth every 4 (four) hours as needed for spasms.   Yes [provider]  levocetirizine (XYZAL) 5 MG tablet Take 5 mg by mouth every evening.   Yes [provider]  losartan (COZAAR) 50 MG tablet Take 50 mg by mouth 2 (two) times daily.    Yes [provider]  meclizine (ANTIVERT) 25 MG tablet Take 25 mg by mouth daily as needed for dizziness.  07/11/17  Yes [provider]  ondansetron (ZOFRAN) 4 MG tablet Take 4 mg by mouth every 8 (eight) hours as needed for nausea or vomiting.   Yes [provider]  pantoprazole (PROTONIX) 40 MG tablet Take 40 mg by mouth daily as needed (reflux).    Yes [provider]  potassium citrate (UROCIT-K) 10 MEQ (1080 MG) SR tablet Take 20 mEq by mouth 2 (two) times daily.  08/05/18  Yes [provider]  aspirin EC 81 MG tablet Take 81 mg by mouth daily.    [provider]     Allergies:     Allergies  Allergen Reactions  . Clonazepam Other (See Comments)    Pruritis  . Codeine Nausea And Vomiting and Other (See Comments)    Reaction unknown  . Sulfa Antibiotics Other (See Comments)    Reactions unknown     Physical Exam:   Vitals  Blood pressure 90/64, pulse (!) 124, temperature 97.9 F (36.6 C), temperature source Oral, resp. rate (!) 30, height 5\' 6"  (1.676 m), weight 52.2 kg, SpO2 94 %.  Physical Examination: General appearance - alert, conversational dyspnea  mental status - alert, oriented to person, place, and time,  Eyes - sclera anicteric Ears- HOH Neck -  supple,  Chest -diminished breath sounds with bibasilar rales heart - S1 and S2 normal, irregularly irregular and tachycardic  abdomen - soft, nontender, nondistended, no masses or organomegaly Neurological - screening mental status exam normal, neck supple without rigidity, cranial nerves II through XII intact, DTR's normal and symmetric Extremities - no pedal edema noted, intact peripheral pulses  Skin - warm, dry     Data Review:    CBC Recent Labs  Lab 01/27/19 1006  WBC 11.5*  HGB 9.7*  HCT 31.4*  PLT 292  MCV 94.0  MCH 29.0  MCHC 30.9  RDW 13.4  LYMPHSABS 0.8  MONOABS 1.1*  EOSABS 0.2  BASOSABS 0.0   ------------------------------------------------------------------------------------------------------------------  Chemistries  Recent Labs  Lab 01/27/19 1006  NA 138  K 3.8  CL 105  CO2 23  GLUCOSE 127*  BUN 15  CREATININE 1.03*  CALCIUM 8.7*  MG 2.0  AST 12*  ALT 11  ALKPHOS 81  BILITOT 0.7   ------------------------------------------------------------------------------------------------------------------ estimated creatinine clearance is 31.1 mL/min (A) (by C-G formula based on SCr of 1.03 mg/dL (H)). ------------------------------------------------------------------------------------------------------------------ Recent Labs    01/27/19 1006  TSH 1.746     Coagulation profile No results for input(s): INR, PROTIME in the last 168 hours. ------------------------------------------------------------------------------------------------------------------- No results for input(s): DDIMER in the last 72 hours. -------------------------------------------------------------------------------------------------------------------  Cardiac Enzymes No results for input(s): CKMB, TROPONINI, MYOGLOBIN in the last 168 hours.  Invalid input(s): CK ------------------------------------------------------------------------------------------------------------------     Component Value Date/Time   BNP 354.0 (H) 01/27/2019 1006     ---------------------------------------------------------------------------------------------------------------  Urinalysis    Component Value Date/Time   COLORURINE STRAW (A) 07/10/2017 2119   APPEARANCEUR CLEAR 07/10/2017 2119   LABSPEC 1.002 (L) 07/10/2017 2119   PHURINE 6.0 07/10/2017 2119   GLUCOSEU NEGATIVE 07/10/2017 2119   HGBUR NEGATIVE 07/10/2017 2119   BILIRUBINUR NEGATIVE 07/10/2017 2119   KETONESUR NEGATIVE  07/10/2017 2119   PROTEINUR NEGATIVE 07/10/2017 2119   UROBILINOGEN 0.2 08/22/2014 1300   NITRITE NEGATIVE 07/10/2017 2119   LEUKOCYTESUR NEGATIVE 07/10/2017 2119    ----------------------------------------------------------------------------------------------------------------   Imaging Results:    Dg Chest Portable 1 View  Result Date: 01/27/2019 CLINICAL DATA:  Nausea and chest pain today.  Atrial fibrillation. EXAM: PORTABLE CHEST 1 VIEW COMPARISON:  PA and lateral chest 10/27/2014. FINDINGS: There is cardiomegaly and pulmonary edema. Small to moderate pleural effusions are seen, larger on the left. Atherosclerosis noted. No acute or focal bony abnormality. IMPRESSION: Congestive heart failure. Electronically Signed   By: Inge Rise M.D.   On: 01/27/2019 10:54    Radiological Exams on Admission: Dg Chest Portable 1 View  Result Date: 01/27/2019 CLINICAL DATA:  Nausea and chest pain today.  Atrial fibrillation. EXAM: PORTABLE CHEST 1 VIEW COMPARISON:  PA and lateral chest 10/27/2014. FINDINGS: There is cardiomegaly and pulmonary edema. Small to moderate pleural effusions are seen, larger on the left. Atherosclerosis noted. No acute or focal bony abnormality. IMPRESSION: Congestive heart failure. Electronically Signed   By: Inge Rise M.D.   On: 01/27/2019 10:54    DVT Prophylaxis -SCD /heparin AM Labs Ordered, also please review Full Orders  Family Communication: Admission,  patients condition and plan of care including tests being ordered have been discussed with the patient and daughter Renee Blake who indicate understanding and agree with the plan   Code Status - Full Code  Likely DC to  home  Condition   stable  Roxan Hockey M.D on 01/27/2019 at 2:21 PM Go to [email or web address] -  for contact info  [facility] - Office  [phone]

## 2019-01-27 NOTE — ED Provider Notes (Signed)
[facility] EMERGENCY DEPARTMENT Provider Note   CSN: [id number] Arrival date & time: 01/27/19  0909     History   Chief Complaint Chief Complaint  Patient presents with  . Chest Pain    HPI Renee Blake is a 85 y.o. female.     HPI   Patient presents for evaluation of chest pain which apparently started during the night.  She was evaluated at home by EMS who felt that she was in A. fib with RVR and treated her with Cardizem.  She had transient improvement of her heart rate from about 170 to  120.  There was no change of her chest discomfort.  She denies palpitations.  Is unclear if she has previously had atrial fibrillation.  Patient and her daughter are not completely aware of that as a prior diagnosis.  The patient states she was sweating during the night but did not document a fever.  She has been generally weak for about a month.  She did not eat this morning but was able to eat yesterday.  She did not take her morning medicines today.  There have been no known sick contacts.  She has not had to see her doctor recently.  There are no other known modifying factors.  Past Medical History:  Diagnosis Date  . Acute blood loss anemia   . Chronic diarrhea   . Dilation of biliary tract    biliary duct per 02/26/13 CT  . Duodenal stricture   . Esophageal stricture   . Gastric ulcer   . GI bleed   . Hyperlipemia   . Hypertension   . Microscopic colitis   . Nephrolithiasis   . Pancreatic duct dilated 02/26/13  . Schatzki's ring   . Skin cancer   . Sliding hiatal hernia   . Vertigo     Patient Active Problem List   Diagnosis Date Noted  . Vaginal mass 07/29/2017  . Heme positive stool 07/25/2017  . Cellulitis 12/10/2016  . Facial cellulitis   . Erroneous encounter - disregard 03/17/2015  . AKI (acute kidney injury) ([place])   . Metabolic acidosis 0000000  . Acute renal failure syndrome ([place])   . Renal failure 08/22/2014  . Hyperkalemia 08/22/2014  . UTI  (urinary tract infection) 08/22/2014  . Acute renal failure ([place]) 08/22/2014  . Dyspnea 03/15/2014  . Diabetes mellitus ([place]) 06/08/2013  . Acute diastolic congestive heart failure ([place]) 06/07/2013  . Syncope 03/26/2013  . Rectal bleeding 03/26/2013  . Dehydration 03/26/2013  . Generalized weakness 03/26/2013  . Malnutrition of moderate degree ([place]) 03/01/2013  . Diarrhea 02/28/2013  . Loss of weight 02/28/2013  . Acute duodenal ulcer with hemorrhage, without mention of obstruction 02/27/2013  . Lower GI bleed 02/26/2013  . Acute blood loss anemia 02/26/2013  . UTI (lower urinary tract infection) 02/26/2013  . Hypertension 02/26/2013    Past Surgical History:  Procedure Laterality Date  . BIOPSY  07/28/2017   Procedure: BIOPSY;  Surgeon: Rogene Houston, MD;  Location: [facility];  Service: Endoscopy;;  rectum  . COLONOSCOPY N/A 07/28/2017   Procedure: COLONOSCOPY;  Surgeon: Rogene Houston, MD;  Location: [facility];  Service: Endoscopy;  Laterality: N/A;  . ESOPHAGOGASTRODUODENOSCOPY N/A 02/27/2013   Procedure: ESOPHAGOGASTRODUODENOSCOPY (EGD);  Surgeon: Ladene Artist, MD;  Location: [facility] ENDOSCOPY;  Service: Endoscopy;  Laterality: N/A;  . ESOPHAGOGASTRODUODENOSCOPY N/A 03/28/2013   Procedure: ESOPHAGOGASTRODUODENOSCOPY (EGD);  Surgeon: Rogene Houston, MD;  Location: [facility];  Service:  Endoscopy;  Laterality: N/A;  . HOT HEMOSTASIS N/A 02/27/2013   Procedure: HOT HEMOSTASIS (ARGON PLASMA COAGULATION/BICAP);  Surgeon: Ladene Artist, MD;  Location: [facility] ENDOSCOPY;  Service: Endoscopy;  Laterality: N/A;  . NOSE SURGERY     for skin cancer  . POLYPECTOMY  07/28/2017   Procedure: POLYPECTOMY;  Surgeon: Rogene Houston, MD;  Location: [facility];  Service: Endoscopy;;  colon      OB History    Gravida  6   Para  6   Term  6   Preterm      AB      Living  6     SAB      TAB      Ectopic      Multiple      Live Births               Home  Medications    Prior to Admission medications   Medication Sig Start Date End Date Taking? Authorizing Provider  ALPRAZolam (XANAX) 0.25 MG tablet Take 0.25 mg by mouth 2 (two) times daily as needed. for anxiety 02/17/15  Yes [provider]  amLODipine (NORVASC) 2.5 MG tablet Take 5 mg by mouth daily.    Yes [provider]  dicyclomine (BENTYL) 10 MG capsule Take 10 mg by mouth every 4 (four) hours as needed for spasms.   Yes [provider]  levocetirizine (XYZAL) 5 MG tablet Take 5 mg by mouth every evening.   Yes [provider]  losartan (COZAAR) 50 MG tablet Take 50 mg by mouth 2 (two) times daily.    Yes [provider]  meclizine (ANTIVERT) 25 MG tablet Take 25 mg by mouth daily as needed for dizziness.  07/11/17  Yes [provider]  ondansetron (ZOFRAN) 4 MG tablet Take 4 mg by mouth every 8 (eight) hours as needed for nausea or vomiting.   Yes [provider]  pantoprazole (PROTONIX) 40 MG tablet Take 40 mg by mouth daily as needed (reflux).    Yes [provider]  potassium citrate (UROCIT-K) 10 MEQ (1080 MG) SR tablet Take 20 mEq by mouth 2 (two) times daily.  08/05/18  Yes [provider]  aspirin EC 81 MG tablet Take 81 mg by mouth daily.    [provider]    Family History Family History  Problem Relation Age of Onset  . Heart attack Father     Social History Social History   Tobacco Use  . Smoking status: Never Smoker  . Smokeless tobacco: Never Used  Substance Use Topics  . Alcohol use: No    Alcohol/week: 0.0 standard drinks  . Drug use: No     Allergies   Clonazepam, Codeine, and Sulfa antibiotics   Review of Systems Review of Systems  All other systems reviewed and are negative.    Physical Exam Updated Vital Signs BP 119/85   Pulse 80   Temp 97.9 F (36.6 C) (Oral)   Resp 15   Ht 5\' 6"  (1.676 m)   Wt 52.2 kg   SpO2 95%   BMI 18.56 kg/m   Physical  Exam Vitals signs and nursing note reviewed.  Constitutional:      General: She is not in acute distress.    Appearance: She is not ill-appearing, toxic-appearing or diaphoretic.     Comments: Elderly, frail  HENT:     Head: Normocephalic and atraumatic.     Right Ear: External ear  normal.     Left Ear: External ear normal.     Nose: Nose normal.     Mouth/Throat:     Mouth: Mucous membranes are moist.  Eyes:     Conjunctiva/sclera: Conjunctivae normal.     Pupils: Pupils are equal, round, and reactive to light.  Neck:     Musculoskeletal: Normal range of motion and neck supple.     Trachea: Phonation normal.  Cardiovascular:     Rate and Rhythm: Tachycardia present. Rhythm irregular.     Heart sounds: Normal heart sounds.  Pulmonary:     Effort: Pulmonary effort is normal. No respiratory distress.     Breath sounds: Normal breath sounds. No stridor. No rhonchi.  Abdominal:     Palpations: Abdomen is soft.     Tenderness: There is no abdominal tenderness.  Musculoskeletal: Normal range of motion.     Comments: No peripheral lower extremity edema  Skin:    General: Skin is warm and dry.  Neurological:     Mental Status: She is alert and oriented to person, place, and time.     Cranial Nerves: No cranial nerve deficit.     Sensory: No sensory deficit.     Motor: No abnormal muscle tone.     Coordination: Coordination normal.  Psychiatric:        Mood and Affect: Mood normal.        Behavior: Behavior normal.      ED Treatments / Results  Labs (all labs ordered are listed, but only abnormal results are displayed) Labs Reviewed  COMPREHENSIVE METABOLIC PANEL - Abnormal; Notable for the following components:      Result Value   Glucose, Bld 127 (*)    Creatinine, Ser 1.03 (*)    Calcium 8.7 (*)    Albumin 3.2 (*)    AST 12 (*)    GFR calc non Af Amer 48 (*)    GFR calc Af Amer 56 (*)    All other components within normal limits  CBC WITH DIFFERENTIAL/PLATELET -  Abnormal; Notable for the following components:   WBC 11.5 (*)    RBC 3.34 (*)    Hemoglobin 9.7 (*)    HCT 31.4 (*)    Neutro Abs 9.3 (*)    Monocytes Absolute 1.1 (*)    All other components within normal limits  BRAIN NATRIURETIC PEPTIDE - Abnormal; Notable for the following components:   B Natriuretic Peptide 354.0 (*)    All other components within normal limits  SARS CORONAVIRUS 2 (TAT 6-24 HRS)  URINALYSIS, ROUTINE W REFLEX MICROSCOPIC    EKG EKG Interpretation  Date/Time:  Wednesday January 27 2019 09:30:47 EDT Ventricular Rate:  152 PR Interval:    QRS Duration: 92 QT Interval:  300 QTC Calculation: 477 R Axis:   -39 Text Interpretation: Atrial fibrillation with rapid V-rate Left axis deviation Abnormal R-wave progression, early transition Repolarization abnormality, prob rate related Since last tracing rate faster and now in Atrial Fibrillation Confirmed by Daleen Bo [phone]) on 01/27/2019 9:37:09 AM  This patients CHA2DS2-VASc Score and unadjusted Ischemic Stroke Rate (% per year) is equal to 4.8 % stroke rate/year from a score of 4  Above score calculated as 1 point each if present [CHF, HTN, DM, Vascular=MI/PAD/Aortic Plaque, Age if 65-74, or Female] Above score calculated as 2 points each if present [Age > 75, or Stroke/TIA/TE]   Radiology Dg Chest Portable 1 View  Result Date: 01/27/2019 CLINICAL DATA:  Nausea and chest  pain today.  Atrial fibrillation. EXAM: PORTABLE CHEST 1 VIEW COMPARISON:  PA and lateral chest 10/27/2014. FINDINGS: There is cardiomegaly and pulmonary edema. Small to moderate pleural effusions are seen, larger on the left. Atherosclerosis noted. No acute or focal bony abnormality. IMPRESSION: Congestive heart failure. Electronically Signed   By: Inge Rise M.D.   On: 01/27/2019 10:54    Procedures .Critical Care Performed by: Daleen Bo, MD Authorized by: Daleen Bo, MD   Critical care provider statement:    Critical  care time (minutes):  35   Critical care start time:  01/27/2019 9:30 AM   Critical care end time:  01/27/2019 12:14 PM   Critical care time was exclusive of:  Separately billable procedures and treating other patients   Critical care was necessary to treat or prevent imminent or life-threatening deterioration of the following conditions:  Cardiac failure   Critical care was time spent personally by me on the following activities:  Blood draw for specimens, development of treatment plan with patient or surrogate, discussions with consultants, evaluation of patient's response to treatment, examination of patient, obtaining history from patient or surrogate, ordering and performing treatments and interventions, ordering and review of laboratory studies, pulse oximetry, re-evaluation of patient's condition, review of old charts and ordering and review of radiographic studies   (including critical care time)  Medications Ordered in ED Medications  0.9 %  sodium chloride infusion ( Intravenous New Bag/Given 01/27/19 0944)  diltiazem (CARDIZEM) 1 mg/mL load via infusion 10 mg (10 mg Intravenous Bolus from Bag 01/27/19 0944)    And  diltiazem (CARDIZEM) 125 mg in dextrose 5% 125 mL (1 mg/mL) infusion (15 mg/hr Intravenous Rate/Dose Change 01/27/19 1112)  furosemide (LASIX) injection 40 mg (has no administration in time range)     Initial Impression / Assessment and Plan / ED Course  I have reviewed the triage vital signs and the nursing notes.  Pertinent labs & imaging results that were available during my care of the patient were reviewed by me and considered in my medical decision making (see chart for details).  Clinical Course as of Jan 27 1223  Wed Jan 27, 2019  1035 Normal except glucose high, creatinine high, calcium low, albumin low, AST low, GFR low  Comprehensive metabolic panel(!) [EW]  99991111 Normal except white count high, hemoglobin low  CBC with Differential(!) [EW]  1036 Total  Protein: 7.1 [EW]  1122 Increased markings and left pleural effusion consistent with fluid overload; interpreted by me  DG Chest Portable 1 View [EW]    Clinical Course User Index [EW] Daleen Bo, MD        Patient Vitals for the past 24 hrs:  BP Temp Temp src Pulse Resp SpO2 Height Weight  01/27/19 1145 119/85 - - 80 15 95 % - -  01/27/19 1130 113/75 - - (!) 122 (!) 29 98 % - -  01/27/19 1110 - - - (!) 128 (!) 23 92 % - -  01/27/19 1100 109/70 - - (!) 124 (!) 35 92 % - -  01/27/19 1049 - - - (!) 104 (!) 28 92 % - -  01/27/19 1045 (!) 112/97 - - (!) 129 (!) 29 92 % - -  01/27/19 1030 95/79 - - (!) 122 (!) 29 92 % - -  01/27/19 1015 108/74 - - (!) 121 (!) 28 92 % - -  01/27/19 1000 113/67 - - (!) 118 (!) 26 93 % - -  01/27/19 0945 (!) 89/53 - - Marland Kitchen)  129 (!) 26 - - -  01/27/19 0940 (!) 94/49 - - (!) 126 (!) 21 94 % - -  01/27/19 0930 (!) 129/94 - - (!) 157 (!) 30 - - -  01/27/19 0920 116/73 - - (!) 135 17 92 % - -  01/27/19 0919 116/73 97.9 F (36.6 C) Oral (!) 125 (!) 24 - - -  01/27/19 0915 - - - - - - 5\' 6"  (1.676 m) 52.2 kg    10:35 AM Reevaluation with update and discussion. After initial assessment and treatment, an updated evaluation reveals she states she feels normal.  Heart rate still elevated and irregular at 125/min.  Oxygen borderline low 92% with supplementation.  IV Lasix ordered for congestive heart failure.  Doubt ACS, PE or pneumonia.  Findings discussed with patient and daughter, all questions answered. Daleen Bo   Medical Decision Making: Evaluation consistent with new onset atrial fibrillation, requiring rate control.  Possible mild dehydration with elevated creatinine.  Additionally there is incidental calcium which is mild.  Elevated glucose, nonfasting.  Albumin low, suspect nutritional.  Mild elevation white count and low hemoglobin.  These are nonspecific findings.  Patient does admit to being tired for the last month, which may reflect an element of  anemia.  Suspect new onset atrial fibrillation with secondary congestive heart failure and hypoxia.  She will require hospitalization for further treatment.   Renee Blake was evaluated in Emergency Department on 01/27/2019 for the symptoms described in the history of present illness. She was evaluated in the context of the global COVID-19 pandemic, which necessitated consideration that the patient might be at risk for infection with the SARS-CoV-2 virus that causes COVID-19. Institutional protocols and algorithms that pertain to the evaluation of patients at risk for COVID-19 are in a state of rapid change based on information released by regulatory bodies including the CDC and federal and state organizations. These policies and algorithms were followed during the patient's care in the ED.   CRITICAL CARE- yes Performed by: Daleen Bo  Nursing Notes Reviewed/ Care Coordinated Applicable Imaging Reviewed Interpretation of Laboratory Data incorporated into ED treatment  12:17 PM-Consult complete with hospitalist. Patient case explained and discussed.  He agrees to admit patient for further evaluation and treatment. Call ended at 12:35 PM  Plan: Admit   Final Clinical Impressions(s) / ED Diagnoses   Final diagnoses:  Atrial fibrillation, new onset ([place])  Congestive heart failure, unspecified HF chronicity, unspecified heart failure type [facility])  Hypoxia    ED Discharge Orders    None       Daleen Bo, MD 01/27/19 1254

## 2019-01-27 NOTE — ED Triage Notes (Addendum)
EMS reports pt c/o not feeling well.  Reports nausea yesterday and chest pain today.  EMS reports pt in afib when they arrived at rate of 178 and increasing to 200 then back down.  Reports no history of afib that she is aware of.  Reports used to take " a white pill for a heart murmur."  EMS gave 20mg  bolus of cardizem and decreased to 115-135.  Reports chest pain subsided.  BP 113/69.  PT alert and oriented.  Lives at home with daughter.  Denies any recent covid exposure.

## 2019-01-27 NOTE — ED Notes (Signed)
Pt is aware a urine sample is needed, she stated she does not need to go at this time. Call bell at bedside.

## 2019-01-28 ENCOUNTER — Inpatient Hospital Stay (HOSPITAL_COMMUNITY): Payer: Medicare Other

## 2019-01-28 DIAGNOSIS — I4891 Unspecified atrial fibrillation: Secondary | ICD-10-CM | POA: Diagnosis not present

## 2019-01-28 DIAGNOSIS — I340 Nonrheumatic mitral (valve) insufficiency: Secondary | ICD-10-CM | POA: Diagnosis not present

## 2019-01-28 DIAGNOSIS — I5031 Acute diastolic (congestive) heart failure: Secondary | ICD-10-CM | POA: Diagnosis not present

## 2019-01-28 DIAGNOSIS — D500 Iron deficiency anemia secondary to blood loss (chronic): Secondary | ICD-10-CM | POA: Diagnosis not present

## 2019-01-28 DIAGNOSIS — I10 Essential (primary) hypertension: Secondary | ICD-10-CM | POA: Diagnosis not present

## 2019-01-28 DIAGNOSIS — I361 Nonrheumatic tricuspid (valve) insufficiency: Secondary | ICD-10-CM | POA: Diagnosis not present

## 2019-01-28 LAB — BASIC METABOLIC PANEL
Anion gap: 14 (ref 5–15)
BUN: 15 mg/dL (ref 8–23)
CO2: 20 mmol/L — ABNORMAL LOW (ref 22–32)
Calcium: 8.4 mg/dL — ABNORMAL LOW (ref 8.9–10.3)
Chloride: 102 mmol/L (ref 98–111)
Creatinine, Ser: 1.1 mg/dL — ABNORMAL HIGH (ref 0.44–1.00)
GFR calc Af Amer: 52 mL/min — ABNORMAL LOW (ref >60)
GFR calc non Af Amer: 45 mL/min — ABNORMAL LOW (ref >60)
Glucose, Bld: 112 mg/dL — ABNORMAL HIGH (ref 70–99)
Potassium: 4 mmol/L (ref 3.5–5.1)
Sodium: 136 mmol/L (ref 135–145)

## 2019-01-28 LAB — CBC
HCT: 31 % — ABNORMAL LOW (ref 36.0–46.0)
Hemoglobin: 9.7 g/dL — ABNORMAL LOW (ref 12.0–15.0)
MCH: 29.1 pg (ref 26.0–34.0)
MCHC: 31.3 g/dL (ref 30.0–36.0)
MCV: 93.1 fL (ref 80.0–100.0)
Platelets: 323 10*3/uL (ref 150–400)
RBC: 3.33 MIL/uL — ABNORMAL LOW (ref 3.87–5.11)
RDW: 13.4 % (ref 11.5–15.5)
WBC: 10.7 10*3/uL — ABNORMAL HIGH (ref 4.0–10.5)
nRBC: 0 % (ref 0.0–0.2)

## 2019-01-28 LAB — ECHOCARDIOGRAM COMPLETE
Height: 66 in
Weight: 1940.05 oz

## 2019-01-28 MED ORDER — TRAZODONE HCL 50 MG PO TABS
50.0000 mg | ORAL_TABLET | Freq: Every day | ORAL | Status: DC
  Administered 2019-01-28 – 2019-02-05 (×9): 50 mg via ORAL
  Filled 2019-01-28 (×9): qty 1

## 2019-01-28 MED ORDER — DILTIAZEM HCL 30 MG PO TABS
30.0000 mg | ORAL_TABLET | Freq: Four times a day (QID) | ORAL | Status: DC
  Administered 2019-01-28 – 2019-01-29 (×3): 30 mg via ORAL
  Filled 2019-01-28 (×3): qty 1

## 2019-01-28 NOTE — Progress Notes (Signed)
Patient Demographics:    Renee Blake, is a 85 y.o. female, DOB - 1929-02-16, TS:[id number]  Admit date - 01/27/2019   Admitting Physician Renee Buczek Renee Brick, MD  Outpatient Primary MD for the patient is Renee Squibb, MD  LOS - 1  Chief Complaint  Patient presents with  . Chest Pain        Subjective:    Renee Blake today has no fevers, no emesis,  No chest pain,   -Apparently converted to sinus rhythm overnight -Complains of sleeping poorly last night, did not ask for as needed trazodone -Daughter at bedside, questions answered -Patient with increased shortness of breath and increased oxygen requirement  Assessment  & Plan :    Principal Problem:   Acute exacerbation of CHF (congestive heart failure) ([place]) Active Problems:   New onset a-fib with RVR   Iron deficiency anemia due to chronic blood loss---H/o Gi Bleed   H/o Prior Lower GI bleed (anorectal Ca)   H/o Prior duodenal ulcer with hemorrhage   Hypertension  Brief Summary:- 85 y.o. 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 fib with RVR  A/p  1)New Onset Afib--- with heart rate up to the 170s on admission -Initially treated with IV Cardizem drip, converted to sinus rhythm in the early hours of 01/28/2019 -Okay to switch to p.o. Cardizem 30 mg every 6 hours starting 01/28/2019 --Echo with EF of 60 to 65% with grade 2 diastolic dysfunction  -0000000, magnesium 2.0, potassium 3.8 -Cardiology consult appreciated CHA2DS2- VASc score   is =  5 (age (2), HTN, CHF, Female)  Which is  equal to = 7.2 % annual risk of stroke -However patient HAS Bled score is elevated due to Age AND history of prior Major bleeding duodenal ulcer  and prior lower GI bleed from an a rectal carcinoma with chronic iron deficiency anemia- secondary to presumed chronic ongoing GI blood loss -Continue aspirin for now, she is not an ideal candidate for FULL Anticoagulation   2)HFpEF--patient with history of prior chronic diastolic dysfunction CHF -Echo with EF of 60 to 65% with grade 2 diastolic dysfunction  - acute on chronic diastolic dysfunction CHF exacerbation due to A. fib with RVR -Chest x-ray with pulmonary edema noted, clinical exam supports CHF flareup, BNP is elevated --Continue IV Lasix for diuresis, -Monitor daily weights and fluid input and output -Low-salt diet --Patient with increased shortness of breath and increased oxygen requirement  3)Chronic iron deficiency anemia--Hgb is 9.7 which is close to patient's baseline -No evidence of significant ongoing bleeding at this time continue to monitor and transfuse as clinically indicated -history of prior Major bleeding duodenal ulcer and prior lower GI bleed from an a rectal carcinoma with chronic iron deficiency anemia- Presumed secondary to chronic ongoing GI blood loss  4)HTN--blood pressure somewhat soft due to A. fib with RVR, discontinued amlodipine, decrease losartan to 25 mg daily.   P.o. Cardizem as above #1  5)H/o Anorectal Carcinoma--- S/p prior Radiation-- recent PET scan Noted.  Patient gets her radiation oncology, GYN and medical oncology care  from [facility] through [place] of care and advanced directives as well as CODE STATUS discussed with patient and patient's daughter Renee Blake) at bedside -Patient is a full code  Disposition/Need for in-Hospital Stay- patient unable to be discharged at this time due to --CHF exacerbation due to A. fib with RVR requiring IV diuresis with close monitoring of electrolytes and renal function  Code Status : Full code  Family Communication:   (patient is alert, awake and coherent) -Discussed with daughterVelva Blake  Disposition Plan  :  TBD  Consults  :  Cardiology DVT Prophylaxis  :   - Heparin - SCDs   Lab Results  Component Value Date   PLT 323 01/28/2019    Inpatient Medications  Scheduled Meds: . aspirin EC  81 mg Oral Daily  . Chlorhexidine Gluconate Cloth  6 each Topical Daily  . diltiazem  30 mg Oral Q6H  . furosemide  40 mg Intravenous BID  . heparin  5,000 Units Subcutaneous Q8H  . loratadine  10 mg Oral QPM  . mouth rinse  15 mL Mouth Rinse BID  . pantoprazole  40 mg Oral Daily  . potassium chloride SA  20 mEq Oral BID  . sodium chloride flush  3 mL Intravenous Q12H   Continuous Infusions: . sodium chloride     PRN Meds:.sodium chloride, acetaminophen **OR** acetaminophen, albuterol, ALPRAZolam, dicyclomine, meclizine, ondansetron **OR** ondansetron (ZOFRAN) IV, ondansetron, polyethylene glycol, sodium chloride flush, traZODone   Anti-infectives (From admission, onward)   None       Objective:   Vitals:   01/28/19 0834 01/28/19 0900 01/28/19 0915 01/28/19 1000  BP:  109/67 (!) 109/53 (!) 92/51  Pulse:  85  76  Resp:  (!) 25  (!) 30  Temp: 98 F (36.7 C)     TempSrc: Oral     SpO2:  98%  98%  Weight:      Height:        Wt Readings from Last 3 Encounters:  01/27/19 55 kg  07/25/17 52.6 kg  07/22/17 51.7 kg    Intake/Output Summary (Last 24 hours) at 01/28/2019 1128 Last data filed at 01/28/2019 1000 Gross per 24 hour  Intake 746.35 ml  Output 1250 ml  Net -503.65 ml   Physical Exam Gen:- Awake Alert, in no apparent distress, speaking in short sentences HEENT:- [place].AT, No sclera icterus Nose- [place] 5L/min Neck-Supple Neck, +ve JVD,.  Lungs-diminished in bases with bibasilar rales CV- S1, S2 normal, regular (h/o Afib)  Abd-  +ve B.Sounds, Abd Soft, No tenderness,    Extremity/Skin:- No  edema, pedal pulses present  Psych-affect is appropriate, oriented x3 Neuro-generalized weakness, no new focal deficits, no tremors   Data Review:   Micro  Results Recent Results (from the past 240 hour(s))  SARS CORONAVIRUS 2 (TAT 6-24 HRS) Nasopharyngeal Nasopharyngeal Swab     Status: None   Collection Time: 01/27/19 11:13 AM   Specimen: Nasopharyngeal Swab  Result Value Ref Range Status   SARS Coronavirus 2 NEGATIVE NEGATIVE Final    Comment: (NOTE) SARS-CoV-2 target nucleic acids are NOT DETECTED. The SARS-CoV-2 RNA is generally detectable in upper and lower respiratory specimens during the acute phase of infection. Negative results do not preclude SARS-CoV-2 infection, do not rule out co-infections with other pathogens, and should not be used as the sole basis for treatment or other patient management decisions. Negative results must be combined with clinical observations, patient history, and epidemiological information. The expected result is Negative.  Fact Sheet for Patients: SugarRoll.be Fact Sheet for Healthcare Providers: https://www.woods-mathews.com/ This test is not yet approved or cleared by the [place] FDA and  has been authorized for detection and/or diagnosis of SARS-CoV-2 by FDA under an Emergency Use Authorization (EUA). This EUA will remain  in effect (meaning this test can be used) for the duration of the COVID-19 declaration under Section 56 4(b)(1) of the Act, 21 U.S.C. section 360bbb-3(b)(1), unless the authorization is terminated or revoked sooner. Performed at [facility], [place] [address], [place], [place] [address]   MRSA PCR Screening     Status: None   Collection Time: 01/27/19  6:33 PM   Specimen: Nasopharyngeal  Result Value Ref Range Status   MRSA by PCR NEGATIVE NEGATIVE Final    Comment:        The GeneXpert MRSA Assay (FDA approved for NASAL specimens only), is one component of a comprehensive MRSA colonization surveillance program. It is not intended to diagnose MRSA infection nor to guide or monitor treatment for MRSA  infections. Performed at [facility], [address], [place], [place] [address]     Radiology Reports Dg Chest Portable 1 View  Result Date: 01/27/2019 CLINICAL DATA:  Nausea and chest pain today.  Atrial fibrillation. EXAM: PORTABLE CHEST 1 VIEW COMPARISON:  PA and lateral chest 10/27/2014. FINDINGS: There is cardiomegaly and pulmonary edema. Small to moderate pleural effusions are seen, larger on the left. Atherosclerosis noted. No acute or focal bony abnormality. IMPRESSION: Congestive heart failure. Electronically Signed   By: Inge Rise M.D.   On: 01/27/2019 10:54     CBC Recent Labs  Lab 01/27/19 1006 01/28/19 0358  WBC 11.5* 10.7*  HGB 9.7* 9.7*  HCT 31.4* 31.0*  PLT 292 323  MCV 94.0 93.1  MCH 29.0 29.1  MCHC 30.9 31.3  RDW 13.4 13.4  LYMPHSABS 0.8  --   MONOABS 1.1*  --   EOSABS 0.2  --   BASOSABS 0.0  --    Chemistries  Recent Labs  Lab 01/27/19 1006 01/28/19 0358  NA 138 136  K 3.8 4.0  CL 105 102  CO2 23 20*  GLUCOSE 127* 112*  BUN 15 15  CREATININE 1.03* 1.10*  CALCIUM 8.7* 8.4*  MG 2.0  --   AST 12*  --   ALT 11  --   ALKPHOS 81  --   BILITOT 0.7  --    ------------------------------------------------------------------------------------------------------------------ No results for input(s): CHOL, HDL, LDLCALC, TRIG, CHOLHDL, LDLDIRECT in the last 72 hours.  Lab Results  Component Value Date   HGBA1C 5.9 (H) 08/23/2014   ------------------------------------------------------------------------------------------------------------------ Recent Labs    01/27/19 1006  TSH 1.746   ----------------------------------------------------------------------------------------------------------------- No results for input(s): VITAMINB12, FOLATE, FERRITIN, TIBC, IRON, RETICCTPCT in the last 72 hours.  Coagulation profile No results for input(s): INR, PROTIME in the last 168 hours.  No results for input(s): DDIMER in the last 72 hours.  Cardiac  Enzymes No results for input(s): CKMB, TROPONINI, MYOGLOBIN in the last 168 hours.  Invalid input(s): CK ------------------------------------------------------------------------------------------------------------------    Component Value Date/Time   BNP 354.0 (H) 01/27/2019 1006   Bellanie Matthew M.D on 01/28/2019 at 11:28 AM  Go to [email or web address] - for contact info  [facility] - Office  [phone]

## 2019-01-28 NOTE — Progress Notes (Signed)
Progress Note  Patient Name: Renee Blake Date of Encounter: 01/28/2019  Primary Cardiologist: Kate Sable, MD ([place])  Subjective   Didn't feel well last night. Resting comfortably.  Inpatient Medications    Scheduled Meds: . aspirin EC  81 mg Oral Daily  . Chlorhexidine Gluconate Cloth  6 each Topical Daily  . furosemide  40 mg Intravenous BID  . heparin  5,000 Units Subcutaneous Q8H  . loratadine  10 mg Oral QPM  . losartan  25 mg Oral Daily  . mouth rinse  15 mL Mouth Rinse BID  . pantoprazole  40 mg Oral Daily  . potassium chloride SA  20 mEq Oral BID  . sodium chloride flush  3 mL Intravenous Q12H   Continuous Infusions: . sodium chloride    . diltiazem (CARDIZEM) infusion 10 mg/hr (01/28/19 0642)   PRN Meds: sodium chloride, acetaminophen **OR** acetaminophen, albuterol, ALPRAZolam, dicyclomine, meclizine, ondansetron **OR** ondansetron (ZOFRAN) IV, ondansetron, polyethylene glycol, sodium chloride flush, traZODone   Vital Signs    Vitals:   01/28/19 0545 01/28/19 0600 01/28/19 0615 01/28/19 0630  BP: (!) 89/77 105/65 106/65 94/60  Pulse: 80 (!) 102 66 86  Resp: (!) 26 (!) 24 20 20   Temp:      TempSrc:      SpO2: 91% 97% 95% 96%  Weight:      Height:        Intake/Output Summary (Last 24 hours) at 01/28/2019 0827 Last data filed at 01/28/2019 [id number] Gross per 24 hour  Intake 723.58 ml  Output 1100 ml  Net -376.42 ml   Filed Weights   01/27/19 0915 01/27/19 1831  Weight: 52.2 kg 55 kg    Telemetry    Sinus rhythm at present, previously in atrial fibrillation - Personally Reviewed  Physical Exam   GEN: No acute distress.   Neck: No JVD Cardiac: RRR, no murmurs, rubs, or gallops.  Respiratory: Diminished at bases bilaterally. GI: Soft, nontender, non-distended  MS: No edema; No deformity. Neuro:  Nonfocal  Psych: Normal affect   Labs    Chemistry Recent Labs  Lab 01/27/19 1006  NA 138  K 3.8  CL 105  CO2 23  GLUCOSE 127*   BUN 15  CREATININE 1.03*  CALCIUM 8.7*  PROT 7.1  ALBUMIN 3.2*  AST 12*  ALT 11  ALKPHOS 81  BILITOT 0.7  GFRNONAA 48*  GFRAA 56*  ANIONGAP 10     Hematology Recent Labs  Lab 01/27/19 1006 01/28/19 0358  WBC 11.5* 10.7*  RBC 3.34* 3.33*  HGB 9.7* 9.7*  HCT 31.4* 31.0*  MCV 94.0 93.1  MCH 29.0 29.1  MCHC 30.9 31.3  RDW 13.4 13.4  PLT 292 323    Cardiac EnzymesNo results for input(s): TROPONINI in the last 168 hours. No results for input(s): TROPIPOC in the last 168 hours.   BNP Recent Labs  Lab 01/27/19 1006  BNP 354.0*     DDimer No results for input(s): DDIMER in the last 168 hours.   Radiology    Dg Chest Portable 1 View  Result Date: 01/27/2019 CLINICAL DATA:  Nausea and chest pain today.  Atrial fibrillation. EXAM: PORTABLE CHEST 1 VIEW COMPARISON:  PA and lateral chest 10/27/2014. FINDINGS: There is cardiomegaly and pulmonary edema. Small to moderate pleural effusions are seen, larger on the left. Atherosclerosis noted. No acute or focal bony abnormality. IMPRESSION: Congestive heart failure. Electronically Signed   By: Inge Rise M.D.   On: 01/27/2019 10:54  Cardiac Studies   Echo pending  Patient Profile     85 y.o. female with a hx of hypertension, GI bleeding, duodenal ulcer, vaginal versus anorectal cancer, esophageal stricture, chronic anemia, and chronic diastolic heart failure who is being seen today for the evaluation of atrial fibrillation and CHF at the request of Dr. Denton Brick.  Assessment & Plan    1.  Rapid atrial fibrillation: Currently on IV diltiazem infusion at 7.5 mg/h and now in sinus rhythm. I will switch to oral diltiazem 30 mg q 6 hrs. I will dc losartan due to low normal BP. 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  She was started on aspirin by internal medicine.  I do not think  she would be an ideal candidate for systemic anticoagulation.  I explained this to the patient and her daughter (on 10/28), [name], who are in agreement with this plan.  2.  Acute diastolic heart failure: Likely precipitated by rapid atrial fibrillation.  She was initially given 40 mg of IV Lasix and then started on IV Lasix 40 mg twice daily. Uncertain about I/O accuracy. An echocardiogram has been ordered and is pending. She had vigorous left ventricular systolic function by echocardiogram in December 2014. I will check a BMET.  3.  Hypertension: Blood pressure is low normal.  I will dc losartan as I am starting oral diltiazem.  4.  Iron deficiency anemia: Hemoglobin 9.7 which is roughly at her baseline.  She has been started on aspirin by internal medicine.  5.  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  She has previously undergone radiation therapy.      For questions or updates, please contact [place] Please consult [email or web address] for contact info under Cardiology/STEMI.      Signed, Kate Sable, MD  01/28/2019, 8:27 AM

## 2019-01-28 NOTE — Progress Notes (Signed)
*  PRELIMINARY RESULTS* Echocardiogram 2D Echocardiogram has been performed.  Renee Blake 01/28/2019, 1:15 PM

## 2019-01-29 DIAGNOSIS — I4891 Unspecified atrial fibrillation: Secondary | ICD-10-CM | POA: Diagnosis not present

## 2019-01-29 DIAGNOSIS — I10 Essential (primary) hypertension: Secondary | ICD-10-CM | POA: Diagnosis not present

## 2019-01-29 DIAGNOSIS — I5031 Acute diastolic (congestive) heart failure: Secondary | ICD-10-CM | POA: Diagnosis not present

## 2019-01-29 DIAGNOSIS — D500 Iron deficiency anemia secondary to blood loss (chronic): Secondary | ICD-10-CM | POA: Diagnosis not present

## 2019-01-29 LAB — BASIC METABOLIC PANEL
Anion gap: 11 (ref 5–15)
BUN: 21 mg/dL (ref 8–23)
CO2: 22 mmol/L (ref 22–32)
Calcium: 8.4 mg/dL — ABNORMAL LOW (ref 8.9–10.3)
Chloride: 102 mmol/L (ref 98–111)
Creatinine, Ser: 1.31 mg/dL — ABNORMAL HIGH (ref 0.44–1.00)
GFR calc Af Amer: 42 mL/min — ABNORMAL LOW (ref >60)
GFR calc non Af Amer: 36 mL/min — ABNORMAL LOW (ref >60)
Glucose, Bld: 134 mg/dL — ABNORMAL HIGH (ref 70–99)
Potassium: 3.9 mmol/L (ref 3.5–5.1)
Sodium: 135 mmol/L (ref 135–145)

## 2019-01-29 MED ORDER — DILTIAZEM HCL 25 MG/5ML IV SOLN
10.0000 mg | Freq: Once | INTRAVENOUS | Status: AC
  Administered 2019-01-29: 10 mg via INTRAVENOUS
  Filled 2019-01-29: qty 5

## 2019-01-29 MED ORDER — METOPROLOL TARTRATE 25 MG PO TABS
25.0000 mg | ORAL_TABLET | Freq: Two times a day (BID) | ORAL | Status: DC
  Administered 2019-01-29 – 2019-02-01 (×8): 25 mg via ORAL
  Filled 2019-01-29 (×8): qty 1

## 2019-01-29 MED ORDER — AMIODARONE HCL 150 MG/3ML IV SOLN
150.0000 mg | Freq: Once | INTRAVENOUS | Status: AC
  Administered 2019-01-29: 12:00:00 150 mg via INTRAVENOUS
  Filled 2019-01-29: qty 3

## 2019-01-29 MED ORDER — DILTIAZEM HCL-DEXTROSE 125-5 MG/125ML-% IV SOLN (PREMIX)
5.0000 mg/h | INTRAVENOUS | Status: DC
  Administered 2019-01-29: 5 mg/h via INTRAVENOUS
  Administered 2019-01-29: 15 mg/h via INTRAVENOUS
  Administered 2019-01-30 (×2): 7.5 mg/h via INTRAVENOUS
  Filled 2019-01-29 (×2): qty 125

## 2019-01-29 NOTE — Progress Notes (Signed)
Progress Note  Patient Name: Renee Blake Date of Encounter: 01/29/2019  Primary Cardiologist: Kate Sable, MD   Subjective   She went back into rapid atrial fibrillation early this morning. She denies palpitations and shortness of breath. Said she feels better overall.  Inpatient Medications    Scheduled Meds: . aspirin EC  81 mg Oral Daily  . Chlorhexidine Gluconate Cloth  6 each Topical Daily  . furosemide  40 mg Intravenous BID  . heparin  5,000 Units Subcutaneous Q8H  . loratadine  10 mg Oral QPM  . mouth rinse  15 mL Mouth Rinse BID  . pantoprazole  40 mg Oral Daily  . potassium chloride SA  20 mEq Oral BID  . sodium chloride flush  3 mL Intravenous Q12H  . traZODone  50 mg Oral QHS   Continuous Infusions: . sodium chloride    . diltiazem (CARDIZEM) infusion 15 mg/hr (01/29/19 [id number])   PRN Meds: sodium chloride, acetaminophen **OR** acetaminophen, albuterol, ALPRAZolam, dicyclomine, meclizine, ondansetron **OR** ondansetron (ZOFRAN) IV, ondansetron, polyethylene glycol, sodium chloride flush   Vital Signs    Vitals:   01/29/19 0600 01/29/19 0700 01/29/19 0747 01/29/19 0800  BP: (!) 86/63 96/62  95/62  Pulse: 98 86  (!) 43  Resp: (!) 21 19  15   Temp:   98.2 F (36.8 C)   TempSrc:   Oral   SpO2: 91% 96%  96%  Weight:      Height:        Intake/Output Summary (Last 24 hours) at 01/29/2019 0906 Last data filed at 01/29/2019 [id number] Gross per 24 hour  Intake 51.68 ml  Output 1550 ml  Net -1498.32 ml   Filed Weights   01/27/19 0915 01/27/19 1831 01/29/19 0500  Weight: 52.2 kg 55 kg 52 kg    Telemetry    Atrial fibrillation, HR currently low 100's-120's - Personally Reviewed  ECG    A fib with RVR, 146 bpm - Personally Reviewed  Physical Exam   GEN: No acute distress.   Neck: No JVD Cardiac: Tachycardic, irregular rhythm, no murmurs, rubs, or gallops.  Respiratory: Clear to auscultation bilaterally. GI: Soft, nontender, non-distended   MS: No edema; No deformity. Neuro:  Nonfocal  Psych: Normal affect   Labs    Chemistry Recent Labs  Lab 01/27/19 1006 01/28/19 0358  NA 138 136  K 3.8 4.0  CL 105 102  CO2 23 20*  GLUCOSE 127* 112*  BUN 15 15  CREATININE 1.03* 1.10*  CALCIUM 8.7* 8.4*  PROT 7.1  --   ALBUMIN 3.2*  --   AST 12*  --   ALT 11  --   ALKPHOS 81  --   BILITOT 0.7  --   GFRNONAA 48* 45*  GFRAA 56* 52*  ANIONGAP 10 14     Hematology Recent Labs  Lab 01/27/19 1006 01/28/19 0358  WBC 11.5* 10.7*  RBC 3.34* 3.33*  HGB 9.7* 9.7*  HCT 31.4* 31.0*  MCV 94.0 93.1  MCH 29.0 29.1  MCHC 30.9 31.3  RDW 13.4 13.4  PLT 292 323    Cardiac EnzymesNo results for input(s): TROPONINI in the last 168 hours. No results for input(s): TROPIPOC in the last 168 hours.   BNP Recent Labs  Lab 01/27/19 1006  BNP 354.0*     DDimer No results for input(s): DDIMER in the last 168 hours.   Radiology    Dg Chest Portable 1 View  Result Date: 01/27/2019 CLINICAL DATA:  Nausea  and chest pain today.  Atrial fibrillation. EXAM: PORTABLE CHEST 1 VIEW COMPARISON:  PA and lateral chest 10/27/2014. FINDINGS: There is cardiomegaly and pulmonary edema. Small to moderate pleural effusions are seen, larger on the left. Atherosclerosis noted. No acute or focal bony abnormality. IMPRESSION: Congestive heart failure. Electronically Signed   By: Inge Rise M.D.   On: 01/27/2019 10:54    Cardiac Studies   Echocardiogram 01/28/19:   1. Left ventricular ejection fraction, by visual estimation, is 60 to 65%. The left ventricle has normal function. There is no left ventricular hypertrophy.  2. Elevated left atrial pressure.  3. Left ventricular diastolic parameters are consistent with Grade II diastolic dysfunction (pseudonormalization).  4. Global right ventricle has normal systolic function.The right ventricular size is normal. No increase in right ventricular wall thickness.  5. Left atrial size was severely  dilated.  6. Right atrial size was mildly dilated.  7. The mitral valve is abnormal. Moderate mitral valve regurgitation. No evidence of mitral stenosis.  8. The MR vena contracta is 0.6 cm. The MV/AV TVI ratio is 1.1. Findings support moderate mitral regurgitation.  9. The tricuspid valve is normal in structure. Tricuspid valve regurgitation is mild. 10. The aortic valve is tricuspid. Aortic valve regurgitation is not visualized. No evidence of aortic valve sclerosis or stenosis. 11. The pulmonic valve was not well visualized. Pulmonic valve regurgitation is mild. 12. Moderately elevated pulmonary artery systolic pressure. 13. The inferior vena cava is normal in size with greater than 50% respiratory variability, suggesting right atrial pressure of 3 mmHg.  Patient Profile     85 y.o. female with a hx of hypertension, GI bleeding,duodenal ulcer, vaginal versus anorectal cancer,esophageal stricture, chronic anemia, and chronic diastolic heart failurewho is being seen today for the evaluation of atrial fibrillation and CHFat the request of Dr. Denton Brick.  Assessment & Plan    1. Rapid atrial fibrillation: She converted to sinus rhythm yesterday but went back into rapid atrial fibrillation early this morning.  She is currently on IV diltiazem infusionat 15 mg/h. Echocardiogram showed severe left atrial dilatation so I suspect she is unlikely to maintain sinus rhythm for very long if she converts back. I will give a 150 mg IV bolus of amiodarone to assist with rate control.  I will also start metoprolol tartrate 25 mg twice daily.  Blood pressure is low normal. 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 She was started on aspirin by internal medicine. I do not think she would be an ideal candidate for systemic anticoagulation.I explained this to the  patient and her daughter (on 10/28), [name], who are in agreement with this plan.  2. Acute diastolic heart failure: Likely precipitated by rapid atrial fibrillation. She was initially given 40 mg of IV Lasix and then started on IV Lasix 40 mg twice daily.   Left ventricular systolic function is normal and she has grade 2 diastolic dysfunction.  She has put out over 1.6 L in the past 24 hours.  Renal function has been stable.  I will check a basic metabolic panel today.   3. Hypertension: Blood pressure is low normal.  I am starting Lopressor 25 mg twice daily for heart rate control so this will need further monitoring.  4. Iron deficiency anemia: Hemoglobin 9.7 on both 01/27/2019 and 01/28/2019, which is roughly at her baseline. She has been started  on aspirin by internal medicine.  5. 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 She has previously undergone radiation therapy.    For questions or updates, please contact [place] Please consult [email or web address] for contact info under Cardiology/STEMI.      Signed, Kate Sable, MD  01/29/2019, 9:06 AM

## 2019-01-29 NOTE — Progress Notes (Addendum)
Patient Demographics:    Renee Blake, is a 85 y.o. female, DOB - 10/03/1928, [id number]  Admit date - 01/27/2019   Admitting Physician Renee Kakos Denton Brick, MD  Outpatient Primary MD for the patient is Renee Squibb, MD  LOS - 2  Chief Complaint  Patient presents with  . Chest Pain        Subjective:    Renee Blake today has no fevers, no emesis,  No chest pain,   -Apparently converted to sinus rhythm overnight -Complains of sleeping poorly last night, did not ask for as needed trazodone -Daughter at bedside, questions answered -Patient with increased shortness of breath and increased oxygen requirement  Assessment  & Plan :    Principal Problem:   Acute exacerbation of CHF (congestive heart failure) (HCC) Active Problems:   New onset a-fib with RVR   Iron deficiency anemia due to chronic blood loss---H/o Gi Bleed   H/o Prior Lower GI bleed (anorectal Ca)   H/o Prior duodenal ulcer with hemorrhage   Hypertension  Brief Summary:- 85 y.o. 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 fib with RVR  A/p  1)New Onset Afib--- with heart rate up to the 170s on admission -Patient converted to [place] on 01/28/2019, went back to A. fib with RVR overnight -Back on iv Cardizem drip -Received IV amiodarone 150 mg x 1 dose -Metoprolol 25 mg p.o. twice daily added -Echo with preserved EF of 50 to 65% with grade 2 diastolic dysfunction CHF and severely dilated left atrium --Echo with EF of 60 to 65% with grade 2 diastolic dysfunction  -0000000, magnesium wnl, potassium wnl -Cardiology consult appreciated CHA2DS2- VASc score   is =  5 (age (2), HTN, CHF, Female)  Which is  equal to = 7.2 %  annual risk of stroke -However patient HAS Bled score is elevated due to Age AND history of prior Major bleeding duodenal ulcer and prior lower GI bleed from an a rectal carcinoma with chronic iron deficiency anemia- secondary to presumed chronic ongoing GI blood loss -Continue aspirin for now, she is not an ideal candidate for FULL Anticoagulation - 2)HFpEF--patient with history of prior chronic diastolic dysfunction CHF -Echo with EF of 60 to 65% with grade 2 diastolic dysfunction  - acute on chronic diastolic dysfunction CHF exacerbation due to A. fib with RVR -Chest x-ray with pulmonary edema noted, clinical exam supports CHF flareup, BNP is elevated --Continue IV Lasix 40 mg twice daily for diuresis, -Fluid balance Negative another 1,400 overnight, -Monitor daily weights and fluid input and output -Low-salt diet -- 3)Chronic iron deficiency anemia--Hgb is 9.7 which is close to patient's baseline -No evidence of significant ongoing bleeding at this time continue to monitor and transfuse as clinically indicated -history of prior Major bleeding duodenal ulcer and prior lower GI bleed from an a rectal carcinoma with chronic iron deficiency anemia- Presumed secondary to chronic ongoing GI blood loss  4)HTN--blood pressure somewhat soft due to A. fib with RVR, discontinued amlodipine,  -Stop losartan as creatinine is trending up and to allow room  for rate control medications- -IV Cardizem and p.o. metoprolol as above  5)H/o Anorectal Carcinoma--- S/p prior Radiation-- recent PET scan Noted.  Patient gets her radiation oncology, GYN and medical oncology care from [facility] through [place] of care and advanced directives as well as CODE STATUS discussed with patient and patient's daughter Renee Blake) at bedside -Patient is a full code  7)AKI----acute kidney injury  -     creatinine on admission= 1.0 ,   baseline creatinine = 0.8   , creatinine is now= 1.31  , renally adjust  medications, avoid nephrotoxic agents/dehydration/hypotension -Stop losartan  8) acute hypoxic respiratory failure--- secondary to dCHF flareup in the setting of A. fib with RVR--- improving with diuresis and rate control, oxygen requirement down to 3 L from 7 L 24 hours ago-    Disposition/Need for in-Hospital Stay- patient unable to be discharged at this time due to --CHF exacerbation due to A. fib with RVR requiring IV diuresis with close monitoring of electrolytes and renal function  Code Status : Full code  Family Communication:   (patient is alert, awake and coherent) -Discussed with daughter- Renee Blake and other daughterMarlowe Blake  Disposition Plan  :  TBD Consults  :  Cardiology DVT Prophylaxis  :   - Heparin - SCDs   Lab Results  Component Value Date   PLT 323 01/28/2019    Inpatient Medications  Scheduled Meds: . aspirin EC  81 mg Oral Daily  . Chlorhexidine Gluconate Cloth  6 each Topical Daily  . furosemide  40 mg Intravenous BID  . heparin  5,000 Units Subcutaneous Q8H  . loratadine  10 mg Oral QPM  . mouth rinse  15 mL Mouth Rinse BID  . metoprolol tartrate  25 mg Oral BID  . pantoprazole  40 mg Oral Daily  . potassium chloride SA  20 mEq Oral BID  . sodium chloride flush  3 mL Intravenous Q12H  . traZODone  50 mg Oral QHS   Continuous Infusions: . sodium chloride    . diltiazem (CARDIZEM) infusion 15 mg/hr (01/29/19 1100)   PRN Meds:.sodium chloride, acetaminophen **OR** acetaminophen, albuterol, ALPRAZolam, dicyclomine, meclizine, ondansetron **OR** ondansetron (ZOFRAN) IV, ondansetron, polyethylene glycol, sodium chloride flush   Anti-infectives (From admission, onward)   None       Objective:   Vitals:   01/29/19 0955 01/29/19 1000 01/29/19 1100 01/29/19 1141  BP: 104/75 (!) 134/95 95/66   Pulse: (!) 109 (!) 105 62   Resp:  (!) 30 20   Temp:    97.7 F (36.5 C)  TempSrc:    Oral  SpO2:  96% 97%   Weight:      Height:        Wt Readings from  Last 3 Encounters:  01/29/19 52 kg  07/25/17 52.6 kg  07/22/17 51.7 kg    Intake/Output Summary (Last 24 hours) at 01/29/2019 1205 Last data filed at 01/29/2019 1100 Gross per 24 hour  Intake 106.74 ml  Output 1550 ml  Net -1443.26 ml   Physical Exam Gen:- Awake Alert, in no apparent distress, speaking in short sentences HEENT:- [place].AT, No sclera icterus Nose- [place] 3L/min Neck-Supple Neck, +ve JVD,.  Lungs-improving air movement, no wheezing  CV- S1, S2 normal, irregularly irregular  abd-  +ve B.Sounds, Abd Soft, No tenderness,    Extremity/Skin:- No  edema, pedal pulses present  Psych-affect is appropriate, oriented x3 Neuro-generalized weakness, no new focal deficits, no tremors   Data Review:   Micro Results  Recent Results (from the past 240 hour(s))  SARS CORONAVIRUS 2 (TAT 6-24 HRS) Nasopharyngeal Nasopharyngeal Swab     Status: None   Collection Time: 01/27/19 11:13 AM   Specimen: Nasopharyngeal Swab  Result Value Ref Range Status   SARS Coronavirus 2 NEGATIVE NEGATIVE Final    Comment: (NOTE) SARS-CoV-2 target nucleic acids are NOT DETECTED. The SARS-CoV-2 RNA is generally detectable in upper and lower respiratory specimens during the acute phase of infection. Negative results do not preclude SARS-CoV-2 infection, do not rule out co-infections with other pathogens, and should not be used as the sole basis for treatment or other patient management decisions. Negative results must be combined with clinical observations, patient history, and epidemiological information. The expected result is Negative. Fact Sheet for Patients: SugarRoll.be Fact Sheet for Healthcare Providers: https://www.woods-mathews.com/ This test is not yet approved or cleared by the [place] FDA and  has been authorized for detection and/or diagnosis of SARS-CoV-2 by FDA under an Emergency Use Authorization (EUA). This EUA will remain  in effect  (meaning this test can be used) for the duration of the COVID-19 declaration under Section 56 4(b)(1) of the Act, 21 U.S.C. section 360bbb-3(b)(1), unless the authorization is terminated or revoked sooner. Performed at [facility], [place] [address] [place], [place] [address]   MRSA PCR Screening     Status: None   Collection Time: 01/27/19  6:33 PM   Specimen: Nasopharyngeal  Result Value Ref Range Status   MRSA by PCR NEGATIVE NEGATIVE Final    Comment:        The GeneXpert MRSA Assay (FDA approved for NASAL specimens only), is one component of a comprehensive MRSA colonization surveillance program. It is not intended to diagnose MRSA infection nor to guide or monitor treatment for MRSA infections. Performed at [facility], [address] [place], [place] [address]     Radiology Reports Dg Chest Portable 1 View  Result Date: 01/27/2019 CLINICAL DATA:  Nausea and chest pain today.  Atrial fibrillation. EXAM: PORTABLE CHEST 1 VIEW COMPARISON:  PA and lateral chest 10/27/2014. FINDINGS: There is cardiomegaly and pulmonary edema. Small to moderate pleural effusions are seen, larger on the left. Atherosclerosis noted. No acute or focal bony abnormality. IMPRESSION: Congestive heart failure. Electronically Signed   By: Inge Rise M.D.   On: 01/27/2019 10:54     CBC Recent Labs  Lab 01/27/19 1006 01/28/19 0358  WBC 11.5* 10.7*  HGB 9.7* 9.7*  HCT 31.4* 31.0*  PLT 292 323  MCV 94.0 93.1  MCH 29.0 29.1  MCHC 30.9 31.3  RDW 13.4 13.4  LYMPHSABS 0.8  --   MONOABS 1.1*  --   EOSABS 0.2  --   BASOSABS 0.0  --    Chemistries  Recent Labs  Lab 01/27/19 1006 01/28/19 0358 01/29/19 0943  NA 138 136 135  K 3.8 4.0 3.9  CL 105 102 102  CO2 23 20* 22  GLUCOSE 127* 112* 134*  BUN 15 15 21   CREATININE 1.03* 1.10* 1.31*  CALCIUM 8.7* 8.4* 8.4*  MG 2.0  --   --   AST 12*  --   --   ALT 11  --   --   ALKPHOS 81  --   --   BILITOT 0.7  --   --     ------------------------------------------------------------------------------------------------------------------ No results for input(s): CHOL, HDL, LDLCALC, TRIG, CHOLHDL, LDLDIRECT in the last 72 hours.  Lab Results  Component Value Date   HGBA1C 5.9 (  H) 08/23/2014   ------------------------------------------------------------------------------------------------------------------ Recent Labs    01/27/19 1006  TSH 1.746   ----------------------------------------------------------------------------------------------------------------- No results for input(s): VITAMINB12, FOLATE, FERRITIN, TIBC, IRON, RETICCTPCT in the last 72 hours.  Coagulation profile No results for input(s): INR, PROTIME in the last 168 hours.  No results for input(s): DDIMER in the last 72 hours.  Cardiac Enzymes No results for input(s): CKMB, TROPONINI, MYOGLOBIN in the last 168 hours.  Invalid input(s): CK ------------------------------------------------------------------------------------------------------------------    Component Value Date/Time   BNP 354.0 (H) 01/27/2019 1006   Blossie Raffel M.D on 01/29/2019 at 12:05 PM  Go to [email or web address] - for contact info  [facility] - Office  [phone]

## 2019-01-29 NOTE — Care Management Important Message (Signed)
Important Message  Patient Details  Name: Renee Blake MRN: [id number] Date of Birth: October 23, 1928   Medicare Important Message Given:  Yes     [name] 01/29/2019, 4:28 PM

## 2019-01-30 ENCOUNTER — Inpatient Hospital Stay (HOSPITAL_COMMUNITY): Payer: Medicare Other

## 2019-01-30 LAB — RENAL FUNCTION PANEL
Albumin: 2.8 g/dL — ABNORMAL LOW (ref 3.5–5.0)
Anion gap: 11 (ref 5–15)
BUN: 27 mg/dL — ABNORMAL HIGH (ref 8–23)
CO2: 22 mmol/L (ref 22–32)
Calcium: 8.6 mg/dL — ABNORMAL LOW (ref 8.9–10.3)
Chloride: 103 mmol/L (ref 98–111)
Creatinine, Ser: 1.29 mg/dL — ABNORMAL HIGH (ref 0.44–1.00)
GFR calc Af Amer: 43 mL/min — ABNORMAL LOW (ref >60)
GFR calc non Af Amer: 37 mL/min — ABNORMAL LOW (ref >60)
Glucose, Bld: 100 mg/dL — ABNORMAL HIGH (ref 70–99)
Phosphorus: 3.9 mg/dL (ref 2.5–4.6)
Potassium: 4.2 mmol/L (ref 3.5–5.1)
Sodium: 136 mmol/L (ref 135–145)

## 2019-01-30 LAB — CBC
HCT: 29.6 % — ABNORMAL LOW (ref 36.0–46.0)
Hemoglobin: 9.2 g/dL — ABNORMAL LOW (ref 12.0–15.0)
MCH: 28.5 pg (ref 26.0–34.0)
MCHC: 31.1 g/dL (ref 30.0–36.0)
MCV: 91.6 fL (ref 80.0–100.0)
Platelets: 287 10*3/uL (ref 150–400)
RBC: 3.23 MIL/uL — ABNORMAL LOW (ref 3.87–5.11)
RDW: 13 % (ref 11.5–15.5)
WBC: 6.4 10*3/uL (ref 4.0–10.5)
nRBC: 0 % (ref 0.0–0.2)

## 2019-01-30 MED ORDER — LOPERAMIDE HCL 2 MG PO CAPS
2.0000 mg | ORAL_CAPSULE | Freq: Every day | ORAL | Status: DC | PRN
  Administered 2019-01-30 – 2019-02-05 (×6): 2 mg via ORAL
  Filled 2019-01-30 (×7): qty 1

## 2019-01-30 MED ORDER — ALUM & MAG HYDROXIDE-SIMETH 200-200-20 MG/5ML PO SUSP
30.0000 mL | Freq: Four times a day (QID) | ORAL | Status: DC | PRN
  Filled 2019-01-30: qty 30

## 2019-01-30 MED ORDER — FUROSEMIDE 40 MG PO TABS
40.0000 mg | ORAL_TABLET | Freq: Two times a day (BID) | ORAL | Status: DC
  Administered 2019-01-30 – 2019-01-31 (×4): 40 mg via ORAL
  Filled 2019-01-30 (×4): qty 1

## 2019-01-30 NOTE — Progress Notes (Signed)
Subjective: Patient says she feels better in terms of her breathing.   Objective: Vital signs in last 24 hours: Temp:  [97.6 F (36.4 C)-98.3 F (36.8 C)] 97.6 F (36.4 C) (10/31 0700) Pulse Rate:  [34-134] 104 (10/31 0700) Resp:  [14-34] 15 (10/31 0700) BP: (81-134)/(56-95) 99/70 (10/31 0700) SpO2:  [86 %-100 %] 95 % (10/31 0807) Weight:  [53.3 kg] 53.3 kg (10/31 0500)  Intake/Output from previous day: 10/30 0701 - 10/31 0700 In: 195.4 [I.V.:195.4] Out: 1400 [Urine:1400] Intake/Output this shift: Total I/O In: -  Out: 600 [Urine:600]  Recent Labs    01/27/19 1006 01/28/19 0358 01/30/19 0457  HGB 9.7* 9.7* 9.2*   Recent Labs    01/28/19 0358 01/30/19 0457  WBC 10.7* 6.4  RBC 3.33* 3.23*  HCT 31.0* 29.6*  PLT 323 287   Recent Labs    01/29/19 0943 01/30/19 0457  NA 135 136  K 3.9 4.2  CL 102 103  CO2 22 22  BUN 21 27*  CREATININE 1.31* 1.29*  GLUCOSE 134* 100*  CALCIUM 8.4* 8.6*   She looks cachectic.  At the present time her IV is occluded and ventricular rate on the monitor is around 150 with atrial fibrillation.  Blood pressure is 80 systolic which I think is probably her baseline and she is certainly a very thin lady.  Lung fields are clinically clear now.  She is alert and oriented.  Assessment/Plan: 1.  Atrial fibrillation with rapid ventricular response.  This initially converted to sinus rhythm but she has a very large left atrium and I suspect she will end up having chronic atrial fibrillation.  Continue with IV Cardizem for the time being but I would consider the use of amiodarone orally in the future.  Anticoagulation systemically is not indicated in view of her other medical conditions which are related to GI blood loss and the possibility of anal versus vaginal cancer.  She will continue with aspirin. 2.  Congestive heart failure-this clinically appears to have improved and I will switch her from intravenous Lasix to oral Lasix.  Repeat chest x-ray  tomorrow morning.   Lynna Zamorano C Sindi Beckworth 01/30/2019, 8:46 AM

## 2019-01-31 ENCOUNTER — Inpatient Hospital Stay (HOSPITAL_COMMUNITY): Payer: Medicare Other

## 2019-01-31 LAB — BASIC METABOLIC PANEL
Anion gap: 9 (ref 5–15)
BUN: 28 mg/dL — ABNORMAL HIGH (ref 8–23)
CO2: 24 mmol/L (ref 22–32)
Calcium: 8.8 mg/dL — ABNORMAL LOW (ref 8.9–10.3)
Chloride: 103 mmol/L (ref 98–111)
Creatinine, Ser: 1.29 mg/dL — ABNORMAL HIGH (ref 0.44–1.00)
GFR calc Af Amer: 43 mL/min — ABNORMAL LOW (ref >60)
GFR calc non Af Amer: 37 mL/min — ABNORMAL LOW (ref >60)
Glucose, Bld: 108 mg/dL — ABNORMAL HIGH (ref 70–99)
Potassium: 4.4 mmol/L (ref 3.5–5.1)
Sodium: 136 mmol/L (ref 135–145)

## 2019-01-31 IMAGING — DX DG CHEST 1V PORT
1 series · 1 of 1 positions shown · non-contrast
Comparison: Chest x-rays dated [DATE] and [DATE].

CLINICAL DATA: Congestive heart failure.

EXAM:
PORTABLE CHEST 1 VIEW

[chest ap]
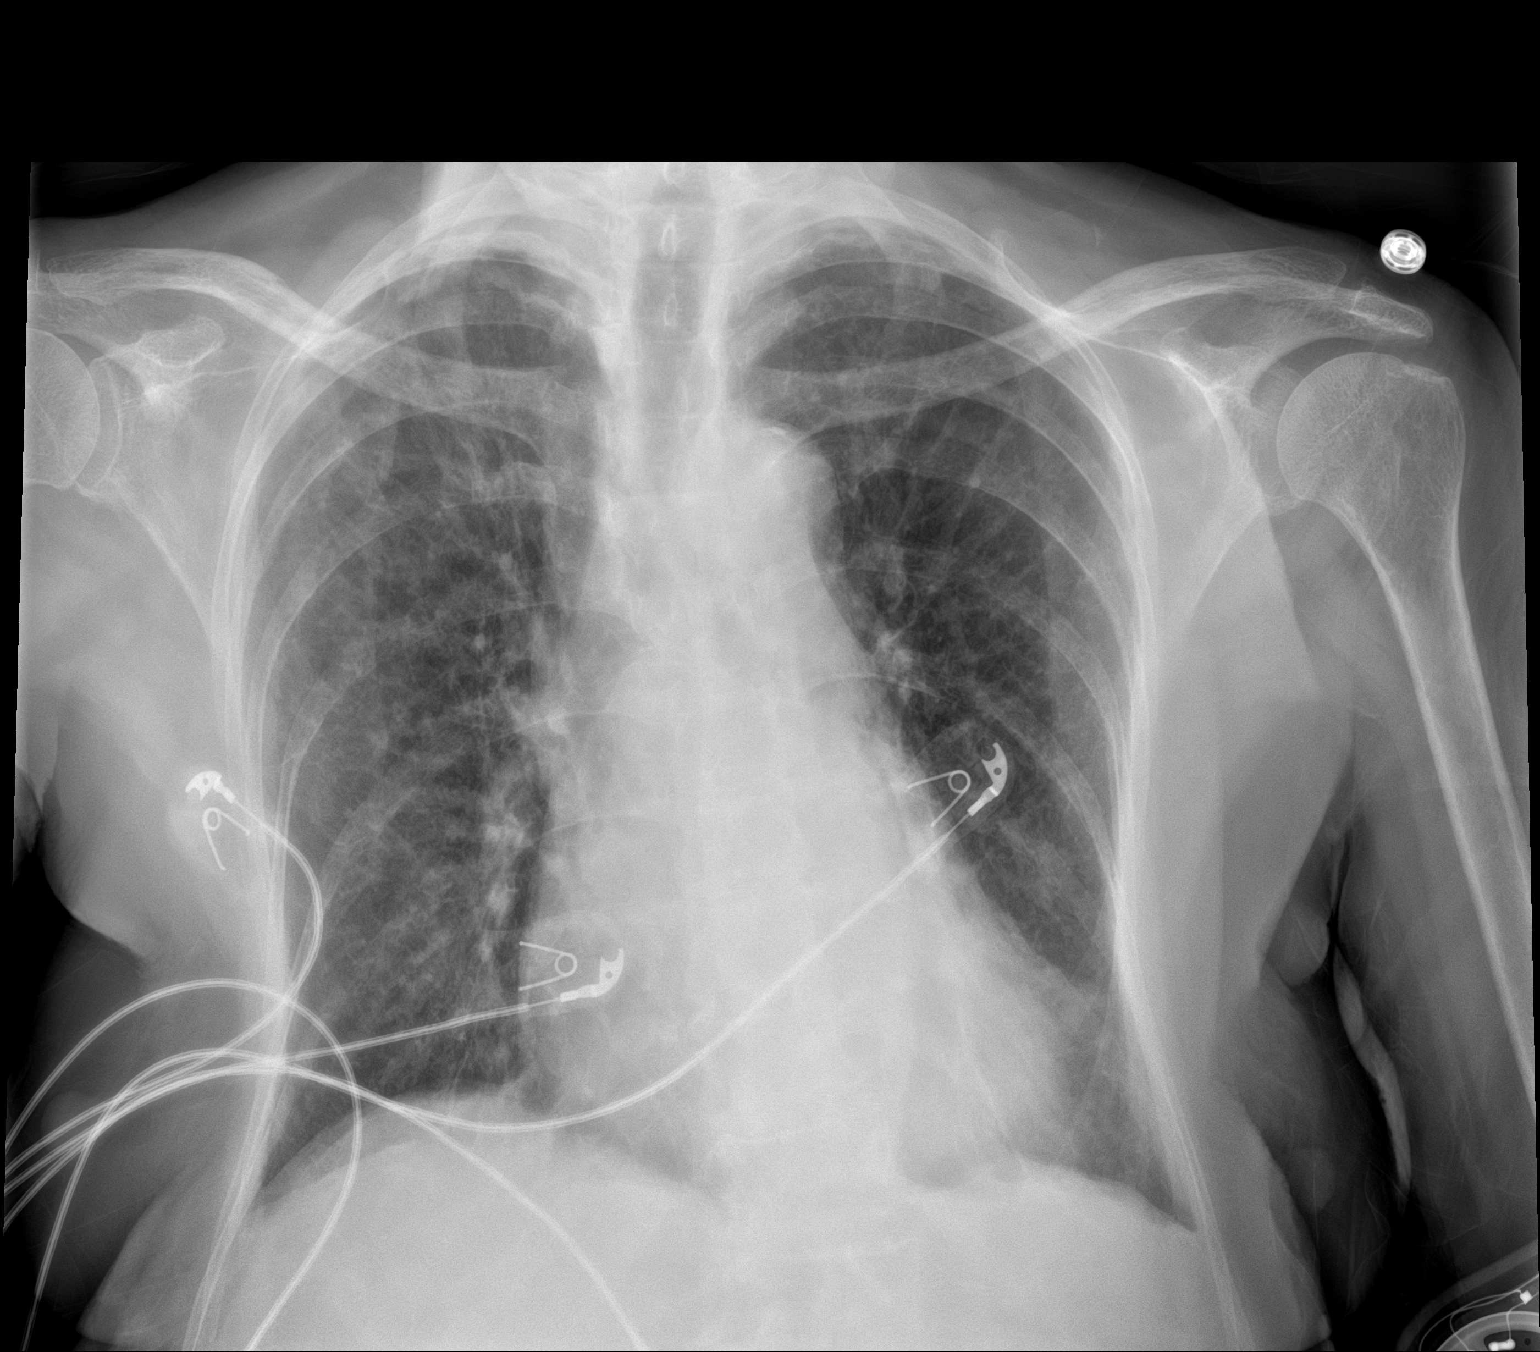

[1 of 1 positions shown; findings below may reference images not displayed]

FINDINGS: Decreased interstitial edema bilaterally suggesting improved fluid
status. Coarse lung markings remain bilaterally indicating some
degree of underlying chronic interstitial lung disease and/or
chronic bronchitic change, with associated lung hyperexpansion. No
confluent opacity to suggest a pneumonia. No pleural effusion or
pneumothorax is seen. Heart size is upper normal. Overall
cardiomediastinal silhouette is stable. Osseous structures about the
chest are unremarkable.
IMPRESSION: 1. Decreased interstitial edema bilaterally suggesting improved
fluid status.
2. Hyperexpanded lungs indicating COPD. Probable associated chronic
interstitial lung disease and/or chronic bronchitic change.

## 2019-01-31 MED ORDER — DILTIAZEM HCL 60 MG PO TABS
60.0000 mg | ORAL_TABLET | Freq: Three times a day (TID) | ORAL | Status: DC
  Administered 2019-01-31 – 2019-02-02 (×7): 60 mg via ORAL
  Filled 2019-01-31 (×8): qty 1

## 2019-01-31 NOTE — Progress Notes (Signed)
Subjective: This lady has done well overnight with intravenous Cardizem drip.  She says she feels better.  Less dyspneic.   Objective: Vital signs in last 24 hours: Temp:  [97.6 F (36.4 C)-97.8 F (36.6 C)] 97.6 F (36.4 C) (11/01 0817) Pulse Rate:  [50-169] 70 (11/01 0800) Resp:  [11-34] 27 (11/01 0800) BP: (89-147)/(50-131) 97/73 (11/01 0800) SpO2:  [85 %-99 %] 94 % (11/01 0800) Weight:  [53 kg] 53 kg (11/01 0500) Her ventricular rate is now in the late 80s and early 90s.  She remains in atrial fibrillation.  Lung fields are clinically clear anteriorly. Intake/Output from previous day: 10/31 0701 - 11/01 0700 In: 240 [P.O.:240] Out: 1400 [Urine:1400] Intake/Output this shift: No intake/output data recorded.  Recent Labs    01/30/19 0457  HGB 9.2*   Recent Labs    01/30/19 0457  WBC 6.4  RBC 3.23*  HCT 29.6*  PLT 287   Recent Labs    01/30/19 0457 01/31/19 0440  NA 136 136  K 4.2 4.4  CL 103 103  CO2 22 24  BUN 27* 28*  CREATININE 1.29* 1.29*  GLUCOSE 100* 108*  CALCIUM 8.6* 8.8*     Assessment/Plan: 1.  Atrial fibrillation with rapid ventricular response.  The ventricular rate has improved significantly and I will now switch her from intravenous Cardizem drip which she was receiving at 7.5 mg/h and transition her to Cardizem by mouth 60 mg every 8 hours. 2.  Congestive heart failure-appears to be stable and continue with oral Lasix.  Await chest x-ray. 3.  I would monitor closely in the intensive care unit today and if her ventricular rate is acceptable, we can transition her to the floor tomorrow and start discharge planning.   Jocee Kissick C Faris Coolman 01/31/2019, 8:22 AM

## 2019-02-01 MED ORDER — FUROSEMIDE 40 MG PO TABS: 40.0000 mg | ORAL_TABLET | Freq: Every day | ORAL | Status: DC

## 2019-02-01 MED FILL — Dextrose Inj 5%: INTRAVENOUS | Qty: 100 | Status: AC

## 2019-02-01 MED FILL — Diltiazem HCl IV Soln 25 MG/5ML (5 MG/ML): INTRAVENOUS | Qty: 25 | Status: AC

## 2019-02-01 NOTE — Progress Notes (Signed)
Patient Demographics:    Renee Blake, is a 85 y.o. female, DOB - 1928-08-27, [id number]  Admit date - 01/27/2019   Admitting Physician Notnamed Croucher Denton Brick, MD  Outpatient Primary MD for the patient is [name], MD  LOS - 5  Chief Complaint  Patient presents with  . Chest Pain        Subjective:    Renee Blake today has no fevers, no emesis,  No chest pain,   Remains in A. Fib -Shortness of breath improving, no dizziness or palpitations  Assessment  & Plan :    Principal Problem:   Acute exacerbation of CHF (congestive heart failure) (HCC) Active Problems:   New onset a-fib with RVR   Iron deficiency anemia due to chronic blood loss---H/o Gi Bleed   H/o Prior Lower GI bleed (anorectal Ca)   H/o Prior duodenal ulcer with hemorrhage   Hypertension  Brief Summary:- 85 y.o. 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 fib with RVR  A/p  1)New Onset Afib--- with heart rate up to the 170s on admission -Patient converted to [place] on 01/28/2019, went back to A. fib with RVR 01/29/19 -Off Cardizem drip -Received IV amiodarone 150 mg x 1 dose -Attempt rate control with Cardizem 60 mg every 8 hours and Metoprolol 25 mg p.o. twice daily  -Echo with preserved EF of 50 to 65% with grade 2 diastolic dysfunction CHF and severely dilated left atrium --Echo with EF of 60 to 65% with grade 2 diastolic dysfunction  -0000000, magnesium wnl, potassium wnl -Cardiology consult appreciated -As per Dr. Jenkins Rouge--- aspirin probably not beneficial, CHA2DS2- VASc score   is =  5 (age (2), HTN, CHF, Female)  Which is  equal to = 7.2 % annual risk of stroke -However patient HAS Bled score is  elevated due to Age AND history of prior Major bleeding duodenal ulcer and prior lower GI bleed from an a rectal carcinoma with chronic iron deficiency anemia- secondary to presumed chronic ongoing GI blood loss - she is not an ideal candidate for FULL Anticoagulation - 2)HFpEF--patient with history of prior chronic diastolic dysfunction CHF -Echo with EF of 60 to 65% with grade 2 diastolic dysfunction  - acute on chronic diastolic dysfunction CHF exacerbation due to A. fib with RVR -Chest x-ray with pulmonary edema noted, clinical exam supports CHF flareup, BNP is elevated -Diuresed well with IV Lasix, switch to p.o. Lasix 40 mg daily -Monitor daily weights and fluid input and output -Low-salt diet -- 3)Chronic iron deficiency anemia--Hgb is 9.2 which is close to patient's baseline -No evidence of significant ongoing bleeding at this time continue to monitor and transfuse as clinically indicated -history of prior Major bleeding duodenal ulcer and prior lower GI bleed from an a rectal carcinoma with chronic iron deficiency anemia- Presumed secondary to chronic ongoing GI blood loss  4)HTN--blood pressure somewhat soft due to A. fib with RVR, discontinued amlodipine,  -Stopped losartan as creatinine is trending up and to allow room for rate control medications- -continue p.o. Cardizem and p.o. metoprolol  as above  5)H/o Anorectal Carcinoma--- S/p prior Radiation-- recent PET scan Noted.  Patient gets her radiation oncology, GYN and medical oncology care from [facility] through [place] of care and advanced directives as well as CODE STATUS discussed with patient and patient's daughters x 2  -Patient is a full code  7)AKI----acute kidney injury  -     creatinine on admission= 1.0 ,   baseline creatinine = 0.8   , creatinine is now= 1.29  , renally adjust medications, avoid nephrotoxic agents/dehydration/hypotension -Stopped losartan  8) acute hypoxic respiratory failure---  secondary to dCHF flareup in the setting of A. fib with RVR--- improving with diuresis and rate control, oxygen requirement down to 2 L  -Repeat chest x-ray from 11//2020 with improvement  9) generalized weakness/deconditioning--will get PT eval once rate control improves   Disposition/Need for in-Hospital Stay- patient unable to be discharged at this time due to --CHF exacerbation due to A. fib with RVR requiring further titration of medications in order to achieve rate control and diuresis with close monitoring of electrolytes and renal function  Code Status : Full code  Family Communication:   (patient is alert, awake and coherent) -Discussed with daughterVelva Harman and daughterMarlowe Kays  Disposition Plan  : Await PT eval once rate control improves Consults  :  Cardiology DVT Prophylaxis  :   - Heparin - SCDs   Lab Results  Component Value Date   PLT 287 01/30/2019    Inpatient Medications  Scheduled Meds: . Chlorhexidine Gluconate Cloth  6 each Topical Daily  . diltiazem  60 mg Oral Q8H  . [START ON 02/02/2019] furosemide  40 mg Oral Daily  . heparin  5,000 Units Subcutaneous Q8H  . loratadine  10 mg Oral QPM  . mouth rinse  15 mL Mouth Rinse BID  . metoprolol tartrate  25 mg Oral BID  . pantoprazole  40 mg Oral Daily  . potassium chloride SA  20 mEq Oral BID  . sodium chloride flush  3 mL Intravenous Q12H  . traZODone  50 mg Oral QHS   Continuous Infusions: . sodium chloride     PRN Meds:.sodium chloride, acetaminophen **OR** acetaminophen, albuterol, ALPRAZolam, alum & mag hydroxide-simeth, dicyclomine, loperamide, meclizine, ondansetron **OR** ondansetron (ZOFRAN) IV, ondansetron, polyethylene glycol, sodium chloride flush   Anti-infectives (From admission, onward)   None       Objective:   Vitals:   02/01/19 0500 02/01/19 0600 02/01/19 0700 02/01/19 0800  BP: 94/61 94/68 (!) 82/58 92/68  Pulse: 84 (!) 39 80 61  Resp: 18 20 11 18   Temp:      TempSrc:       SpO2: 96% 96% 98% 97%  Weight: 53.6 kg     Height:        Wt Readings from Last 3 Encounters:  02/01/19 53.6 kg  07/25/17 52.6 kg  07/22/17 51.7 kg    Intake/Output Summary (Last 24 hours) at 02/01/2019 1326 Last data filed at 02/01/2019 0800 Gross per 24 hour  Intake 360 ml  Output -  Net 360 ml   Physical Exam Gen:- Awake Alert, in no apparent distress, No conversational dyspnea  HEENT:- [place].AT, No sclera icterus Nose- [place] 2L/min Neck-Supple Neck, +ve JVD,.  Lungs-improving air movement, no wheezing  CV- S1, S2 normal, irregularly irregular  abd-  +ve B.Sounds, Abd Soft, No tenderness,    Extremity/Skin:- No  edema, pedal pulses present  Psych-affect is appropriate, oriented x3 Neuro-generalized weakness, no new focal deficits,  no tremors   Data Review:   Micro Results Recent Results (from the past 240 hour(s))  SARS CORONAVIRUS 2 (TAT 6-24 HRS) Nasopharyngeal Nasopharyngeal Swab     Status: None   Collection Time: 01/27/19 11:13 AM   Specimen: Nasopharyngeal Swab  Result Value Ref Range Status   SARS Coronavirus 2 NEGATIVE NEGATIVE Final    Comment: (NOTE) SARS-CoV-2 target nucleic acids are NOT DETECTED. The SARS-CoV-2 RNA is generally detectable in upper and lower respiratory specimens during the acute phase of infection. Negative results do not preclude SARS-CoV-2 infection, do not rule out co-infections with other pathogens, and should not be used as the sole basis for treatment or other patient management decisions. Negative results must be combined with clinical observations, patient history, and epidemiological information. The expected result is Negative. Fact Sheet for Patients: SugarRoll.be Fact Sheet for Healthcare Providers: https://www.woods-mathews.com/ This test is not yet approved or cleared by the [place] FDA and  has been authorized for detection and/or diagnosis of SARS-CoV-2 by FDA under an  Emergency Use Authorization (EUA). This EUA will remain  in effect (meaning this test can be used) for the duration of the COVID-19 declaration under Section 56 4(b)(1) of the Act, 21 U.S.C. section 360bbb-3(b)(1), unless the authorization is terminated or revoked sooner. Performed at [facility], [place] [address] [place], [place] [address]   MRSA PCR Screening     Status: None   Collection Time: 01/27/19  6:33 PM   Specimen: Nasopharyngeal  Result Value Ref Range Status   MRSA by PCR NEGATIVE NEGATIVE Final    Comment:        The GeneXpert MRSA Assay (FDA approved for NASAL specimens only), is one component of a comprehensive MRSA colonization surveillance program. It is not intended to diagnose MRSA infection nor to guide or monitor treatment for MRSA infections. Performed at [facility], [address], [place],  [address]     Radiology Reports Dg Chest [place] 1 View  Result Date: 01/31/2019 CLINICAL DATA:  Congestive heart failure. EXAM: PORTABLE CHEST 1 VIEW COMPARISON:  Chest x-rays dated 01/27/2019 and 10/27/2014. FINDINGS: Decreased interstitial edema bilaterally suggesting improved fluid status. Coarse lung markings remain bilaterally indicating some degree of underlying chronic interstitial lung disease and/or chronic bronchitic change, with associated lung hyperexpansion. No confluent opacity to suggest a pneumonia. No pleural effusion or pneumothorax is seen. Heart size is upper normal. Overall cardiomediastinal silhouette is stable. Osseous structures about the chest are unremarkable. IMPRESSION: 1. Decreased interstitial edema bilaterally suggesting improved fluid status. 2. Hyperexpanded lungs indicating COPD. Probable associated chronic interstitial lung disease and/or chronic bronchitic change. Electronically Signed   By: Franki Cabot M.D.   On: 01/31/2019 08:52   Dg Chest Portable 1 View  Result Date: 01/27/2019 CLINICAL DATA:  Nausea and chest pain  today.  Atrial fibrillation. EXAM: PORTABLE CHEST 1 VIEW COMPARISON:  PA and lateral chest 10/27/2014. FINDINGS: There is cardiomegaly and pulmonary edema. Small to moderate pleural effusions are seen, larger on the left. Atherosclerosis noted. No acute or focal bony abnormality. IMPRESSION: Congestive heart failure. Electronically Signed   By: Inge Rise M.D.   On: 01/27/2019 10:54     CBC Recent Labs  Lab 01/27/19 1006 01/28/19 0358 01/30/19 0457  WBC 11.5* 10.7* 6.4  HGB 9.7* 9.7* 9.2*  HCT 31.4* 31.0* 29.6*  PLT 292 323 287  MCV 94.0 93.1 91.6  MCH 29.0 29.1 28.5  MCHC 30.9 31.3 31.1  RDW 13.4 13.4 13.0  LYMPHSABS 0.8  --   --   MONOABS 1.1*  --   --   EOSABS 0.2  --   --   BASOSABS 0.0  --   --    Chemistries  Recent Labs  Lab 01/27/19 1006 01/28/19 0358 01/29/19 0943 01/30/19 0457 01/31/19 0440  NA 138 136 135 136 136  K 3.8 4.0 3.9 4.2 4.4  CL 105 102 102 103 103  CO2 23 20* 22 22 24   GLUCOSE 127* 112* 134* 100* 108*  BUN 15 15 21  27* 28*  CREATININE 1.03* 1.10* 1.31* 1.29* 1.29*  CALCIUM 8.7* 8.4* 8.4* 8.6* 8.8*  MG 2.0  --   --   --   --   AST 12*  --   --   --   --   ALT 11  --   --   --   --   ALKPHOS 81  --   --   --   --   BILITOT 0.7  --   --   --   --    ------------------------------------------------------------------------------------------------------------------ No results for input(s): CHOL, HDL, LDLCALC, TRIG, CHOLHDL, LDLDIRECT in the last 72 hours.  Lab Results  Component Value Date   HGBA1C 5.9 (H) 08/23/2014   ------------------------------------------------------------------------------------------------------------------ No results for input(s): TSH, T4TOTAL, T3FREE, THYROIDAB in the last 72 hours.  Invalid input(s): FREET3 ----------------------------------------------------------------------------------------------------------------- No results for input(s): VITAMINB12, FOLATE, FERRITIN, TIBC, IRON, RETICCTPCT in the last 72  hours.  Coagulation profile No results for input(s): INR, PROTIME in the last 168 hours.  No results for input(s): DDIMER in the last 72 hours.  Cardiac Enzymes No results for input(s): CKMB, TROPONINI, MYOGLOBIN in the last 168 hours.  Invalid input(s): CK ------------------------------------------------------------------------------------------------------------------    Component Value Date/Time   BNP 354.0 (H) 01/27/2019 1006   Roxan Hockey M.D on 02/01/2019 at 1:26 PM  Go to [email or web address] - for contact info  [facility] - Office  [phone]

## 2019-02-01 NOTE — Progress Notes (Deleted)
CRITICAL VALUE ALERT  Critical Value: K 6.5  Date & Time Notied:  11/2 @ 2019  Provider Notified: Baltazar Najjar  Orders Received/Actions taken: pending orders

## 2019-02-01 NOTE — Progress Notes (Signed)
Progress Note  Patient Name: Renee Blake Date of Encounter: 02/01/2019  Primary Cardiologist: Kate Sable, MD   Subjective   No cardiac complaints   Inpatient Medications    Scheduled Meds: . aspirin EC  81 mg Oral Daily  . Chlorhexidine Gluconate Cloth  6 each Topical Daily  . diltiazem  60 mg Oral Q8H  . furosemide  40 mg Oral BID  . heparin  5,000 Units Subcutaneous Q8H  . loratadine  10 mg Oral QPM  . mouth rinse  15 mL Mouth Rinse BID  . metoprolol tartrate  25 mg Oral BID  . pantoprazole  40 mg Oral Daily  . potassium chloride SA  20 mEq Oral BID  . sodium chloride flush  3 mL Intravenous Q12H  . traZODone  50 mg Oral QHS   Continuous Infusions: . sodium chloride     PRN Meds: sodium chloride, acetaminophen **OR** acetaminophen, albuterol, ALPRAZolam, alum & mag hydroxide-simeth, dicyclomine, loperamide, meclizine, ondansetron **OR** ondansetron (ZOFRAN) IV, ondansetron, polyethylene glycol, sodium chloride flush   Vital Signs    Vitals:   02/01/19 0200 02/01/19 0300 02/01/19 0400 02/01/19 0500  BP: 104/77 (!) 93/56 97/65   Pulse: (!) 54 75 86   Resp: 15 14 16    Temp:   97.9 F (36.6 C)   TempSrc:   Oral   SpO2: 95% 97% 97%   Weight:    53.6 kg  Height:        Intake/Output Summary (Last 24 hours) at 02/01/2019 [id number] Last data filed at 01/31/2019 0841 Gross per 24 hour  Intake 219.98 ml  Output -  Net 219.98 ml   Filed Weights   01/30/19 0500 01/31/19 0500 02/01/19 0500  Weight: 53.3 kg 53 kg 53.6 kg    Telemetry    Atrial fibrillation, HR currently  90''s   ECG    A fib with RVR, 146 bpm - Personally Reviewed  Physical Exam   Affect appropriate Elderly female  HEENT: normal Neck supple with no adenopathy JVP normal no bruits no thyromegaly Lungs clear with no wheezing and good diaphragmatic motion Heart:  S1/S2 no murmur, no rub, gallop or click PMI normal Abdomen: benighn, BS positve, no tenderness, no AAA no bruit.  No  HSM or HJR Distal pulses intact with no bruits No edema Neuro non-focal Skin warm and dry No muscular weakness   Labs    Chemistry Recent Labs  Lab 01/27/19 1006  01/29/19 0943 01/30/19 0457 01/31/19 0440  NA 138   < > 135 136 136  K 3.8   < > 3.9 4.2 4.4  CL 105   < > 102 103 103  CO2 23   < > 22 22 24   GLUCOSE 127*   < > 134* 100* 108*  BUN 15   < > 21 27* 28*  CREATININE 1.03*   < > 1.31* 1.29* 1.29*  CALCIUM 8.7*   < > 8.4* 8.6* 8.8*  PROT 7.1  --   --   --   --   ALBUMIN 3.2*  --   --  2.8*  --   AST 12*  --   --   --   --   ALT 11  --   --   --   --   ALKPHOS 81  --   --   --   --   BILITOT 0.7  --   --   --   --   GFRNONAA 48*   < >  36* 37* 37*  GFRAA 56*   < > 42* 43* 43*  ANIONGAP 10   < > 11 11 9    < > = values in this interval not displayed.     Hematology Recent Labs  Lab 01/27/19 1006 01/28/19 0358 01/30/19 0457  WBC 11.5* 10.7* 6.4  RBC 3.34* 3.33* 3.23*  HGB 9.7* 9.7* 9.2*  HCT 31.4* 31.0* 29.6*  MCV 94.0 93.1 91.6  MCH 29.0 29.1 28.5  MCHC 30.9 31.3 31.1  RDW 13.4 13.4 13.0  PLT 292 323 287    Cardiac EnzymesNo results for input(s): TROPONINI in the last 168 hours. No results for input(s): TROPIPOC in the last 168 hours.   BNP Recent Labs  Lab 01/27/19 1006  BNP 354.0*     DDimer No results for input(s): DDIMER in the last 168 hours.   Radiology    Dg Chest Port 1 View  Result Date: 01/31/2019 CLINICAL DATA:  Congestive heart failure. EXAM: PORTABLE CHEST 1 VIEW COMPARISON:  Chest x-rays dated 01/27/2019 and 10/27/2014. FINDINGS: Decreased interstitial edema bilaterally suggesting improved fluid status. Coarse lung markings remain bilaterally indicating some degree of underlying chronic interstitial lung disease and/or chronic bronchitic change, with associated lung hyperexpansion. No confluent opacity to suggest a pneumonia. No pleural effusion or pneumothorax is seen. Heart size is upper normal. Overall cardiomediastinal  silhouette is stable. Osseous structures about the chest are unremarkable. IMPRESSION: 1. Decreased interstitial edema bilaterally suggesting improved fluid status. 2. Hyperexpanded lungs indicating COPD. Probable associated chronic interstitial lung disease and/or chronic bronchitic change. Electronically Signed   By: Franki Cabot M.D.   On: 01/31/2019 08:52    Cardiac Studies   Echocardiogram 01/28/19:   1. Left ventricular ejection fraction, by visual estimation, is 60 to 65%. The left ventricle has normal function. There is no left ventricular hypertrophy.  2. Elevated left atrial pressure.  3. Left ventricular diastolic parameters are consistent with Grade II diastolic dysfunction (pseudonormalization).  4. Global right ventricle has normal systolic function.The right ventricular size is normal. No increase in right ventricular wall thickness.  5. Left atrial size was severely dilated.  6. Right atrial size was mildly dilated.  7. The mitral valve is abnormal. Moderate mitral valve regurgitation. No evidence of mitral stenosis.  8. The MR vena contracta is 0.6 cm. The MV/AV TVI ratio is 1.1. Findings support moderate mitral regurgitation.  9. The tricuspid valve is normal in structure. Tricuspid valve regurgitation is mild. 10. The aortic valve is tricuspid. Aortic valve regurgitation is not visualized. No evidence of aortic valve sclerosis or stenosis. 11. The pulmonic valve was not well visualized. Pulmonic valve regurgitation is mild. 12. Moderately elevated pulmonary artery systolic pressure. 13. The inferior vena cava is normal in size with greater than 50% respiratory variability, suggesting right atrial pressure of 3 mmHg.  Patient Profile     85 y.o. female with a hx of hypertension, GI bleeding,duodenal ulcer, vaginal versus anorectal cancer,esophageal stricture, chronic anemia, and chronic diastolic heart failurewho is being seen today for the evaluation of atrial  fibrillation and CHFat the request of Dr. Denton Brick.  Assessment & Plan    1. Rapid atrial fibrillation: continue cardizem now oral not a candidate for anticoagulation due to bleeding and age per Dr Bronson Ing  Rate is reasonable this am   2. Acute diastolic heart failure: stable continue lasix decrease to daily from bid as BP is getting soft   3. Hypertension: low this am follow  4. Iron deficiency anemia:  Hemoglobin 9.2 She has been started on aspirin by internal medicine.Not clear that she needs this as she has no CAD and it is not helpful for PAF  5. 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 She has previously undergone radiation therapy.    For questions or updates, please contact [place] Please consult [email or web address] for contact info under Cardiology/STEMI.      Signed, Jenkins Rouge, MD  02/01/2019, 8:22 AM

## 2019-02-01 NOTE — Progress Notes (Signed)
Per patient: if her family calls tell them we are not taking any calls, they are to call her room phone or her cell phone.

## 2019-02-01 NOTE — TOC Initial Note (Signed)
Transition of Care [facility]) - Initial/Assessment Note    Patient Details  Name: Renee Blake MRN: [id number] Date of Birth: 04/01/1929  [facility] of [facility] CM/SW Contact:    Ihor Gully, LCSW Phone Number: 02/01/2019, 10:37 AM  Clinical Narrative:                 Patient from home with daughter. Admitted for acute exacerbation of CHF. Daughter, [name], contacted TOC to discuss discharge plan. At baseline, patient ambulates independently and is independent in activities of daily living. She has no DME in the home.  [name] was inquiring about patient's needs once she is discharged. Discussed that when patient is medically stable, a PT order would be placed and recommendations would be made. TOC following.    Expected Discharge Plan: [place] Barriers to Discharge: Continued Medical Work up   Patient Goals and CMS Choice Patient states their goals for this hospitalization and ongoing recovery are:: To return home with in home help CMS Medicare.gov Compare Post Acute Care list provided to:: Patient Choice offered to / list presented to : Adult Children  Expected Discharge Plan and Services Expected Discharge Plan: [place]       Living arrangements for the past 2 months: Single Family Home                                      Prior Living Arrangements/Services Living arrangements for the past 2 months: Single Family Home Lives with:: Adult Children Patient language and need for interpreter reviewed:: Yes Do you feel safe going back to the place where you live?: Yes      Need for Family Participation in Patient Care: Yes (Comment) Care giver support system in place?: Yes (comment)   Criminal Activity/Legal Involvement Pertinent to Current Situation/Hospitalization: No - Comment as needed  Activities of Daily Living Home Assistive Devices/Equipment: None ADL Screening (condition at time of admission) Patient's cognitive  ability adequate to safely complete daily activities?: Yes Is the patient deaf or have difficulty hearing?: No Does the patient have difficulty seeing, even when wearing glasses/contacts?: No Does the patient have difficulty concentrating, remembering, or making decisions?: No Patient able to express need for assistance with ADLs?: Yes Does the patient have difficulty dressing or bathing?: No Independently performs ADLs?: Yes (appropriate for developmental age) Does the patient have difficulty walking or climbing stairs?: Yes Weakness of Legs: None Weakness of Arms/Hands: None  Permission Sought/Granted Permission sought to share information with : Family Supports          Permission granted to share info w Relationship: daughter, [name]     Emotional Assessment Appearance:: Appears stated age   Affect (typically observed): Unable to Assess Orientation: : Oriented to Self, Oriented to Place, Oriented to  Time, Oriented to Situation Alcohol / Substance Use: Not Applicable    Admission diagnosis:  Hypoxia [R09.02] Atrial fibrillation, new onset ([place]) [I48.91] Congestive heart failure, unspecified HF chronicity, unspecified heart failure type ([place]) [I50.9] Acute exacerbation of CHF (congestive heart failure) ([place]) [I50.9] Patient Active Problem List   Diagnosis Date Noted  . Acute exacerbation of CHF (congestive heart failure) ([place]) 01/27/2019  . New onset a-fib with RVR 01/27/2019  . Iron deficiency anemia due to chronic blood loss---H/o Gi Bleed 01/27/2019  . Vaginal mass 07/29/2017  . Heme positive stool 07/25/2017  . Cellulitis 12/10/2016  .  Facial cellulitis   . Erroneous encounter - disregard 03/17/2015  . AKI (acute kidney injury) ([place])   . Metabolic acidosis 0000000  . Acute renal failure syndrome ([place])   . Renal failure 08/22/2014  . Hyperkalemia 08/22/2014  . UTI (urinary tract infection) 08/22/2014  . Acute renal failure ([place]) 08/22/2014  . Dyspnea 03/15/2014   . Diabetes mellitus ([place]) 06/08/2013  . Acute diastolic congestive heart failure ([place]) 06/07/2013  . Syncope 03/26/2013  . Rectal bleeding 03/26/2013  . Dehydration 03/26/2013  . Generalized weakness 03/26/2013  . Malnutrition of moderate degree ([place]) 03/01/2013  . Diarrhea 02/28/2013  . Loss of weight 02/28/2013  . H/o Prior duodenal ulcer with hemorrhage 02/27/2013  . H/o Prior Lower GI bleed (anorectal Ca) 02/26/2013  . Acute blood loss anemia 02/26/2013  . UTI (lower urinary tract infection) 02/26/2013  . Hypertension 02/26/2013   PCP:  [name], MD Pharmacy:   [facility], [place] [address] Phone: [phone] Fax: [phone]     Social Determinants of Health (SDOH) Interventions    Readmission Risk Interventions No flowsheet data found.
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
## 2019-02-01 NOTE — Care Management Important Message (Signed)
Important Message  Patient Details  Name: Renee Blake MRN: [id number] Date of Birth: [birth year]   Medicare Important Message Given:  Yes     [name] 02/01/2019, 1:22 PM

## 2019-02-02 DIAGNOSIS — I5033 Acute on chronic diastolic (congestive) heart failure: Secondary | ICD-10-CM

## 2019-02-02 LAB — RENAL FUNCTION PANEL
Albumin: 2.7 g/dL — ABNORMAL LOW (ref 3.5–5.0)
Anion gap: 9 (ref 5–15)
BUN: 32 mg/dL — ABNORMAL HIGH (ref 8–23)
CO2: 20 mmol/L — ABNORMAL LOW (ref 22–32)
Calcium: 8.8 mg/dL — ABNORMAL LOW (ref 8.9–10.3)
Chloride: 109 mmol/L (ref 98–111)
Creatinine, Ser: 1.21 mg/dL — ABNORMAL HIGH (ref 0.44–1.00)
GFR calc Af Amer: 46 mL/min — ABNORMAL LOW (ref >60)
GFR calc non Af Amer: 40 mL/min — ABNORMAL LOW (ref >60)
Glucose, Bld: 104 mg/dL — ABNORMAL HIGH (ref 70–99)
Phosphorus: 3.9 mg/dL (ref 2.5–4.6)
Potassium: 4.3 mmol/L (ref 3.5–5.1)
Sodium: 138 mmol/L (ref 135–145)

## 2019-02-02 LAB — CBC
HCT: 31 % — ABNORMAL LOW (ref 36.0–46.0)
Hemoglobin: 9.7 g/dL — ABNORMAL LOW (ref 12.0–15.0)
MCH: 28.8 pg (ref 26.0–34.0)
MCHC: 31.3 g/dL (ref 30.0–36.0)
MCV: 92 fL (ref 80.0–100.0)
Platelets: 375 10*3/uL (ref 150–400)
RBC: 3.37 MIL/uL — ABNORMAL LOW (ref 3.87–5.11)
RDW: 13.2 % (ref 11.5–15.5)
WBC: 6 10*3/uL (ref 4.0–10.5)
nRBC: 0 % (ref 0.0–0.2)

## 2019-02-02 LAB — MAGNESIUM: Magnesium: 2.1 mg/dL (ref 1.7–2.4)

## 2019-02-02 MED ORDER — FUROSEMIDE 20 MG PO TABS
20.0000 mg | ORAL_TABLET | Freq: Every day | ORAL | Status: DC
  Administered 2019-02-02 – 2019-02-06 (×5): 20 mg via ORAL
  Filled 2019-02-02 (×5): qty 1

## 2019-02-02 MED ORDER — DILTIAZEM HCL 30 MG PO TABS
30.0000 mg | ORAL_TABLET | Freq: Four times a day (QID) | ORAL | Status: AC
  Administered 2019-02-02 – 2019-02-05 (×12): 30 mg via ORAL
  Filled 2019-02-02 (×12): qty 1

## 2019-02-02 MED ORDER — METOPROLOL TARTRATE 25 MG PO TABS
12.5000 mg | ORAL_TABLET | Freq: Two times a day (BID) | ORAL | Status: DC
  Administered 2019-02-02 – 2019-02-03 (×3): 12.5 mg via ORAL
  Filled 2019-02-02 (×3): qty 1

## 2019-02-02 NOTE — Progress Notes (Signed)
Progress Note  Patient Name: Renee Blake Date of Encounter: 02/02/2019  Primary Cardiologist: Kate Sable, MD  Subjective   States that she slept reasonably well, was somewhat short of breath this morning when she got up, but feels better now.  No chest pain or palpitations.  Inpatient Medications    Scheduled Meds: . Chlorhexidine Gluconate Cloth  6 each Topical Daily  . diltiazem  30 mg Oral Q6H  . furosemide  20 mg Oral Daily  . heparin  5,000 Units Subcutaneous Q8H  . loratadine  10 mg Oral QPM  . mouth rinse  15 mL Mouth Rinse BID  . metoprolol tartrate  12.5 mg Oral BID  . pantoprazole  40 mg Oral Daily  . potassium chloride SA  20 mEq Oral BID  . sodium chloride flush  3 mL Intravenous Q12H  . traZODone  50 mg Oral QHS   Continuous Infusions: . sodium chloride     PRN Meds: sodium chloride, acetaminophen **OR** acetaminophen, albuterol, ALPRAZolam, alum & mag hydroxide-simeth, dicyclomine, loperamide, meclizine, ondansetron **OR** ondansetron (ZOFRAN) IV, ondansetron, polyethylene glycol, sodium chloride flush   Vital Signs    Vitals:   02/02/19 0758 02/02/19 0800 02/02/19 0813 02/02/19 0900  BP:  (!) 85/65 (!) 88/58 103/69  Pulse: 75 85 69 84  Resp: (!) 24 (!) 25 (!) 26 16  Temp: (!) 97.2 F (36.2 C)     TempSrc: Oral     SpO2: 92% 97% 97% 96%  Weight:      Height:        Intake/Output Summary (Last 24 hours) at 02/02/2019 0924 Last data filed at 02/02/2019 0900 Gross per 24 hour  Intake 760 ml  Output -  Net 760 ml   Filed Weights   01/30/19 0500 01/31/19 0500 02/01/19 0500  Weight: 53.3 kg 53 kg 53.6 kg    Telemetry    Coarse atrial fibrillation versus atypical atrial flutter with variable conduction.  Personally reviewed.  ECG    An ECG dated 02/01/2019 was personally reviewed today and demonstrated:  Coarse atrial fibrillation versus atypical atrial flutter with variable conduction, left anterior fascicular block, diffuse  repolarization abnormalities.  Physical Exam   GEN:  Frail-appearing elderly woman, no acute distress.   Neck: No JVD. Cardiac:  Irregularly irregular, no gallop.  Respiratory:  Decreased breath sounds without wheezing. GI: Soft, nontender, bowel sounds present. MS: No edema; No deformity. Neuro:  Nonfocal. Psych: Alert and oriented x 3. Normal affect.  Labs    Chemistry Recent Labs  Lab 01/27/19 1006  01/30/19 0457 01/31/19 0440 02/02/19 0407  NA 138   < > 136 136 138  K 3.8   < > 4.2 4.4 4.3  CL 105   < > 103 103 109  CO2 23   < > 22 24 20*  GLUCOSE 127*   < > 100* 108* 104*  BUN 15   < > 27* 28* 32*  CREATININE 1.03*   < > 1.29* 1.29* 1.21*  CALCIUM 8.7*   < > 8.6* 8.8* 8.8*  PROT 7.1  --   --   --   --   ALBUMIN 3.2*  --  2.8*  --  2.7*  AST 12*  --   --   --   --   ALT 11  --   --   --   --   ALKPHOS 81  --   --   --   --   BILITOT 0.7  --   --   --   --  GFRNONAA 48*   < > 37* 37* 40*  GFRAA 56*   < > 43* 43* 46*  ANIONGAP 10   < > 11 9 9    < > = values in this interval not displayed.     Hematology Recent Labs  Lab 01/28/19 0358 01/30/19 0457 02/02/19 0407  WBC 10.7* 6.4 6.0  RBC 3.33* 3.23* 3.37*  HGB 9.7* 9.2* 9.7*  HCT 31.0* 29.6* 31.0*  MCV 93.1 91.6 92.0  MCH 29.1 28.5 28.8  MCHC 31.3 31.1 31.3  RDW 13.4 13.0 13.2  PLT 323 287 375    BNP Recent Labs  Lab 01/27/19 1006  BNP 354.0*     Radiology    No results found.  Cardiac Studies   Echocardiogram 01/28/2019:  1. Left ventricular ejection fraction, by visual estimation, is 60 to 65%. The left ventricle has normal function. There is no left ventricular hypertrophy.  2. Elevated left atrial pressure.  3. Left ventricular diastolic parameters are consistent with Grade II diastolic dysfunction (pseudonormalization).  4. Global right ventricle has normal systolic function.The right ventricular size is normal. No increase in right ventricular wall thickness.  5. Left atrial size was  severely dilated.  6. Right atrial size was mildly dilated.  7. The mitral valve is abnormal. Moderate mitral valve regurgitation. No evidence of mitral stenosis.  8. The MR vena contracta is 0.6 cm. The MV/AV TVI ratio is 1.1. Findings support moderate mitral regurgitation.  9. The tricuspid valve is normal in structure. Tricuspid valve regurgitation is mild. 10. The aortic valve is tricuspid. Aortic valve regurgitation is not visualized. No evidence of aortic valve sclerosis or stenosis. 11. The pulmonic valve was not well visualized. Pulmonic valve regurgitation is mild. 12. Moderately elevated pulmonary artery systolic pressure. 13. The inferior vena cava is normal in size with greater than 50% respiratory variability, suggesting right atrial pressure of 3 mmHg.  Patient Profile     85 y.o. female tree of hypertension, GI bleed with prior duodenal ulcer, vaginal versus anorectal cancer, esophageal stricture, chronic anemia, and chronic diastolic heart failure being followed for management of atrial fibrillation.  Assessment & Plan    1.  Coarse atrial fibrillation versus atypical atrial flutter.  Still working on optimal heart rate control in the setting of relatively low blood pressure.  CHADSVASC score is 3.  She is not felt to be a good candidate for anticoagulation based on evaluation by Dr. Bronson Ing.  Echocardiogram shows severely dilated left atrium and moderate mitral regurgitation, making the likelihood of maintaining sinus rhythm low.  2.  Acute on chronic diastolic heart failure in the setting of problem #1.  She has diuresed at least 4-1/2 L during hospital stay, Lasix has been cut back to 20 mg daily.  3.  Essential hypertension by history, blood pressures running low recently necessitating medication adjustments.  I spoke with the hospitalist team and agree with their recent changes in medications.  Cardizem is now at 30 mg p.o. every 6 hours and Lopressor cut back to 12.5 mg  twice daily.  Follow and see how she does in terms of blood pressure and heart rate control, particularly as ambulatory status advances.  Amiodarone for heart rate control would be a consideration, although would generally try to avoid this if possible.  Not pursuing cardioversion obviously in the absence of anticoagulation.  Signed, Rozann Lesches, MD  02/02/2019, 9:24 AM

## 2019-02-02 NOTE — Evaluation (Signed)
Physical Therapy Evaluation Patient Details Name: Renee Blake MRN: [id number] DOB: Mar 05, 1929 Today's Date: 02/02/2019   History of Present Illness  Renee Blake  is a 85 y.o. 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 fib with RVR , EMS  treated her with iv Cardizem.  She had transient improvement of her heart rate from about 170 to  120.    Clinical Impression  Patient demonstrates slow labored movement for sitting up at bedside without assist, able to put socks on while seated at bedside without loss of balance, very unsteady on feet without AD requiring hand held assist to maintain standing balance, required use of RW for safety and limited to ambulating to doorway and back to bedside secondary to c/o fatigue.  Patient tolerated sitting up in chair with her daughter present in room after therapy.  Patient will benefit from continued physical therapy in hospital and recommended venue below to increase strength, balance, endurance for safe ADLs and gait.    Follow Up Recommendations Home health PT;Supervision for mobility/OOB;Supervision/Assistance - 24 hour    Equipment Recommendations  Rolling walker with 5" wheels;3in1 (PT);Other (comment)(Shower chair)    Recommendations for Other Services       Precautions / Restrictions Precautions Precautions: Fall Restrictions Weight Bearing Restrictions: No      Mobility  Bed Mobility Overal bed mobility: Modified Independent             General bed mobility comments: increased time, labored movement  Transfers Overall transfer level: Needs assistance Equipment used: Rolling walker (2 wheeled);None Transfers: Sit to/from  [facility] Sit to Stand: Min guard;Min assist Stand pivot transfers: Min guard;Min assist       General transfer comment: very unsteady on feet without AD, required use of RW for safety  Ambulation/Gait Ambulation/Gait assistance: Min guard;Min assist Gait Distance (Feet): 20 Feet Assistive device: Rolling walker (2 wheeled) Gait Pattern/deviations: Decreased step length - right;Decreased step length - left;Decreased stance time - left Gait velocity: decreased   General Gait Details: slow labored cadence with frequent leaning on RW due to weakness, limited to walking from doorway to back to bedside  Stairs            Wheelchair Mobility    Modified Rankin (Stroke Patients Only)       Balance Overall balance assessment: Needs assistance Sitting-balance support: Feet supported;No upper extremity supported Sitting balance-Leahy Scale: Fair Sitting balance - Comments: fair/good seated at bedside   Standing balance support: During functional activity;No upper extremity supported Standing balance-Leahy Scale: Poor Standing balance comment: fair using RW                             Pertinent Vitals/Pain Pain Assessment: No/denies pain    Home Living Family/patient expects to be discharged to:: Private residence Living Arrangements: Children Available Help at Discharge: Family;Friend(s);Available 24 hours/day Type of Home: Apartment Home Access: Stairs to enter Entrance Stairs-Rails: None Entrance Stairs-Number of Steps: 1 Home Layout: One level Home Equipment: None      Prior Function Level of Independence: Independent         Comments: Household ambulator  Hand Dominance   Dominant Hand: Right    Extremity/Trunk Assessment   Upper Extremity Assessment Upper Extremity Assessment: Generalized weakness    Lower Extremity Assessment Lower Extremity Assessment: Generalized weakness    Cervical / Trunk  Assessment Cervical / Trunk Assessment: Normal  Communication   Communication: No difficulties  Cognition Arousal/Alertness: Awake/alert Behavior During Therapy: WFL for tasks assessed/performed Overall Cognitive Status: Within Functional Limits for tasks assessed                                        General Comments      Exercises     Assessment/Plan    PT Assessment Patient needs continued PT services  PT Problem List Decreased strength;Decreased activity tolerance;Decreased balance;Decreased mobility       PT Treatment Interventions Gait training;Stair training;Functional mobility training;Therapeutic activities;Therapeutic exercise;Patient/family education;Balance training    PT Goals (Current goals can be found in the Care Plan section)  Acute Rehab PT Goals Patient Stated Goal: Return home with family to assist PT Goal Formulation: With patient/family Time For Goal Achievement: 02/06/19 Potential to Achieve Goals: Good    Frequency Min 3X/week   Barriers to discharge        Co-evaluation               AM-PAC PT "6 Clicks" Mobility  Outcome Measure Help needed turning from your back to your side while in a flat bed without using bedrails?: None Help needed moving from lying on your back to sitting on the side of a flat bed without using bedrails?: None Help needed moving to and from a bed to a chair (including a wheelchair)?: A Little Help needed standing up from a chair using your arms (e.g., wheelchair or bedside chair)?: A Little Help needed to walk in hospital room?: A Little Help needed climbing 3-5 steps with a railing? : A Lot 6 Click Score: 19    End of Session   Activity Tolerance: Patient tolerated treatment well;Patient limited by fatigue Patient left: in chair;with call bell/phone within reach;with family/visitor present Nurse Communication: Mobility status PT Visit Diagnosis: Unsteadiness on feet (R26.81);Other  abnormalities of gait and mobility (R26.89);Muscle weakness (generalized) (M62.81)    Time: [id number] PT Time Calculation (min) (ACUTE ONLY): 25 min   Charges:   PT Evaluation $PT Eval Moderate Complexity: 1 Mod PT Treatments $Therapeutic Activity: 23-37 mins        2:02 PM, 02/02/19 Lonell Grandchild, MPT Physical Therapist with [facility] [phone] office [phone] mobile phone

## 2019-02-02 NOTE — Progress Notes (Signed)
Patient Demographics:    Renee Blake, is a 85 y.o. female, DOB - 11-06-1928, [id number]  Admit date - 01/27/2019   Admitting Physician Mariyam Remington Denton Brick, MD  Outpatient Primary MD for the patient is [name], MD  LOS - 6  Chief Complaint  Patient presents with  . Chest Pain        Subjective:    Renee Blake today has no fevers, no emesis,  No chest pain,   Remains in A. Fib -Shortness of breath improving,  -Overnight blood pressures were soft -Daughter Marlowe Kays at bedside, questions answered  Assessment  & Plan :    Principal Problem:   Acute exacerbation of CHF (congestive heart failure) ([place]) Active Problems:   New onset a-fib with RVR   Iron deficiency anemia due to chronic blood loss---H/o Gi Bleed   H/o Prior Lower GI bleed (anorectal Ca)   H/o Prior duodenal ulcer with hemorrhage   Hypertension  Brief Summary:- 85 y.o. 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 fib with RVR  A/p  1)New Onset Afib--- with heart rate up to the 170s on admission -Patient converted to [place] on 01/28/2019, went back to A. fib with RVR 01/29/19 -Off Cardizem drip -Received IV amiodarone 150 mg x 1 dose -Blood pressures have been soft so change Cardizem to 30 mg every 6 hours, decrease metoprolol to 12.5 mg twice daily due to BP concerns  -Echo with preserved EF of 50 to 65% with grade 2 diastolic dysfunction CHF and severely dilated left atrium--so patient is unlikely to stay in sinus rhythm if she converts -TSH-1.74, magnesium wnl, potassium wnl -Cardiology consult appreciated -As per Dr. Jenkins Rouge--- aspirin probably not beneficial, CHA2DS2- VASc score   is =  5 (age (2), HTN,  CHF, Female)  Which is  equal to = 7.2 % annual risk of stroke -However patient HAS Bled score is elevated due to Age AND history of prior Major bleeding duodenal ulcer and prior lower GI bleed from an a rectal carcinoma with chronic iron deficiency anemia- secondary to presumed chronic ongoing GI blood loss - she is not an ideal candidate for FULL Anticoagulation -Inability to anticoagulate precludes cardioversion   2)HFpEF--patient with history of prior chronic diastolic dysfunction CHF -Echo with EF of 60 to 65% with grade 2 diastolic dysfunction  - acute on chronic diastolic dysfunction CHF exacerbation due to A. fib with RVR --Patient has diuresed well, -CHF is improved, decrease Lasix to 20 mg daily due to soft BP -Monitor daily weights and fluid input and output -Low-salt diet -- 3)Chronic iron deficiency anemia--Hgb is 8.8 which is close to patient's baseline -No evidence of significant ongoing bleeding at this time continue to monitor and transfuse as clinically indicated -history of prior Major bleeding duodenal ulcer and prior lower GI bleed from an a rectal carcinoma with chronic iron deficiency anemia- Presumed secondary to chronic ongoing GI blood loss  4)HTN--blood pressure somewhat soft due to A. fib with RVR, discontinued amlodipine,  -Stopped losartan as creatinine is trending up and to allow  room for rate control medications- -continue p.o. Cardizem and p.o. metoprolol as above in #1  5)H/o Anorectal Carcinoma--- S/p prior Radiation-- recent PET scan Noted.  Patient gets her radiation oncology, GYN and medical oncology care from [facility] through [place] of care and advanced directives as well as CODE STATUS discussed with patient and patient's daughters x 2  -Patient is a full code  7)AKI----acute kidney injury  -     creatinine on admission= 1.0 ,   baseline creatinine = 0.8   , creatinine is now= 1.21  , renally adjust medications, avoid nephrotoxic  agents/dehydration/hypotension -Stopped losartan  8)Acute hypoxic respiratory failure--- secondary to dCHF flareup in the setting of A. fib with RVR--- improving with diuresis and rate control, oxygen requirement down to 2 L  -Repeat chest x-ray from 11//2020 with improvement - 9)Generalized weakness/deconditioning--will get PT eval once rate control improves   Disposition/Need for in-Hospital Stay- patient unable to be discharged at this time due to --CHF exacerbation due to A. fib with RVR requiring further titration of medications in order to achieve rate control and diuresis with close monitoring of electrolytes and renal function  Code Status : Full code  Family Communication:   (patient is alert, awake and coherent) -Discussed with daughterVelva Harman and daughterMarlowe Kays  Disposition Plan  : Await PT eval once rate control improves Consults  :  Cardiology DVT Prophylaxis  :   - Heparin - SCDs   Lab Results  Component Value Date   PLT 375 02/02/2019    Inpatient Medications  Scheduled Meds: . Chlorhexidine Gluconate Cloth  6 each Topical Daily  . diltiazem  30 mg Oral Q6H  . furosemide  20 mg Oral Daily  . heparin  5,000 Units Subcutaneous Q8H  . loratadine  10 mg Oral QPM  . mouth rinse  15 mL Mouth Rinse BID  . metoprolol tartrate  12.5 mg Oral BID  . pantoprazole  40 mg Oral Daily  . potassium chloride SA  20 mEq Oral BID  . sodium chloride flush  3 mL Intravenous Q12H  . traZODone  50 mg Oral QHS   Continuous Infusions: . sodium chloride     PRN Meds:.sodium chloride, acetaminophen **OR** acetaminophen, albuterol, ALPRAZolam, alum & mag hydroxide-simeth, dicyclomine, loperamide, meclizine, ondansetron **OR** ondansetron (ZOFRAN) IV, ondansetron, polyethylene glycol, sodium chloride flush   Anti-infectives (From admission, onward)   None       Objective:   Vitals:   02/02/19 0800 02/02/19 0813 02/02/19 0900 02/02/19 0940  BP: (!) 85/65 (!) 88/58 103/69 119/88   Pulse: 85 69 84 80  Resp: (!) 25 (!) 26 16 19   Temp:    97.9 F (36.6 C)  TempSrc:      SpO2: 97% 97% 96% 99%  Weight:    52.2 kg  Height:        Wt Readings from Last 3 Encounters:  02/02/19 52.2 kg  07/25/17 52.6 kg  07/22/17 51.7 kg    Intake/Output Summary (Last 24 hours) at 02/02/2019 1118 Last data filed at 02/02/2019 0900 Gross per 24 hour  Intake 760 ml  Output -  Net 760 ml   Physical Exam Gen:- Awake Alert, in no apparent distress, No conversational dyspnea  HEENT:- [place].AT, No sclera icterus Nose- [place] 2L/min Neck-Supple Neck, +ve JVD,.  Lungs-improving air movement, no wheezing  CV- S1, S2 normal, irregularly irregular  abd-  +ve B.Sounds, Abd Soft, No tenderness,    Extremity/Skin:- No  edema, pedal pulses  present  Psych-affect is appropriate, oriented x3 Neuro-generalized weakness, no new focal deficits, no tremors   Data Review:   Micro Results Recent Results (from the past 240 hour(s))  SARS CORONAVIRUS 2 (TAT 6-24 HRS) Nasopharyngeal Nasopharyngeal Swab     Status: None   Collection Time: 01/27/19 11:13 AM   Specimen: Nasopharyngeal Swab  Result Value Ref Range Status   SARS Coronavirus 2 NEGATIVE NEGATIVE Final    Comment: (NOTE) SARS-CoV-2 target nucleic acids are NOT DETECTED. The SARS-CoV-2 RNA is generally detectable in upper and lower respiratory specimens during the acute phase of infection. Negative results do not preclude SARS-CoV-2 infection, do not rule out co-infections with other pathogens, and should not be used as the sole basis for treatment or other patient management decisions. Negative results must be combined with clinical observations, patient history, and epidemiological information. The expected result is Negative. Fact Sheet for Patients: SugarRoll.be Fact Sheet for Healthcare Providers: https://www.woods-mathews.com/ This test is not yet approved or cleared by the [place] FDA  and  has been authorized for detection and/or diagnosis of SARS-CoV-2 by FDA under an Emergency Use Authorization (EUA). This EUA will remain  in effect (meaning this test can be used) for the duration of the COVID-19 declaration under Section 56 4(b)(1) of the Act, 21 U.S.C. section 360bbb-3(b)(1), unless the authorization is terminated or revoked sooner. Performed at [facility], [place] [address], [place], [place] [address]   MRSA PCR Screening     Status: None   Collection Time: 01/27/19  6:33 PM   Specimen: Nasopharyngeal  Result Value Ref Range Status   MRSA by PCR NEGATIVE NEGATIVE Final    Comment:        The GeneXpert MRSA Assay (FDA approved for NASAL specimens only), is one component of a comprehensive MRSA colonization surveillance program. It is not intended to diagnose MRSA infection nor to guide or monitor treatment for MRSA infections. Performed at [facility], [address], [place], [place] [address]     Radiology Reports Dg Chest [place] 1 View  Result Date: 01/31/2019 CLINICAL DATA:  Congestive heart failure. EXAM: PORTABLE CHEST 1 VIEW COMPARISON:  Chest x-rays dated 01/27/2019 and 10/27/2014. FINDINGS: Decreased interstitial edema bilaterally suggesting improved fluid status. Coarse lung markings remain bilaterally indicating some degree of underlying chronic interstitial lung disease and/or chronic bronchitic change, with associated lung hyperexpansion. No confluent opacity to suggest a pneumonia. No pleural effusion or pneumothorax is seen. Heart size is upper normal. Overall cardiomediastinal silhouette is stable. Osseous structures about the chest are unremarkable. IMPRESSION: 1. Decreased interstitial edema bilaterally suggesting improved fluid status. 2. Hyperexpanded lungs indicating COPD. Probable associated chronic interstitial lung disease and/or chronic bronchitic change. Electronically Signed   By: Franki Cabot M.D.   On: 01/31/2019 08:52   Dg  Chest Portable 1 View  Result Date: 01/27/2019 CLINICAL DATA:  Nausea and chest pain today.  Atrial fibrillation. EXAM: PORTABLE CHEST 1 VIEW COMPARISON:  PA and lateral chest 10/27/2014. FINDINGS: There is cardiomegaly and pulmonary edema. Small to moderate pleural effusions are seen, larger on the left. Atherosclerosis noted. No acute or focal bony abnormality. IMPRESSION: Congestive heart failure. Electronically Signed   By: Inge Rise M.D.   On: 01/27/2019 10:54     CBC Recent Labs  Lab 01/27/19 1006 01/28/19 0358 01/30/19 0457 02/02/19 0407  WBC 11.5* 10.7* 6.4 6.0  HGB 9.7* 9.7* 9.2* 9.7*  HCT 31.4* 31.0* 29.6* 31.0*  PLT 292 323 287 375  MCV  94.0 93.1 91.6 92.0  MCH 29.0 29.1 28.5 28.8  MCHC 30.9 31.3 31.1 31.3  RDW 13.4 13.4 13.0 13.2  LYMPHSABS 0.8  --   --   --   MONOABS 1.1*  --   --   --   EOSABS 0.2  --   --   --   BASOSABS 0.0  --   --   --    Chemistries  Recent Labs  Lab 01/27/19 1006 01/28/19 0358 01/29/19 0943 01/30/19 0457 01/31/19 0440 02/02/19 0407  NA 138 136 135 136 136 138  K 3.8 4.0 3.9 4.2 4.4 4.3  CL 105 102 102 103 103 109  CO2 23 20* 22 22 24  20*  GLUCOSE 127* 112* 134* 100* 108* 104*  BUN 15 15 21  27* 28* 32*  CREATININE 1.03* 1.10* 1.31* 1.29* 1.29* 1.21*  CALCIUM 8.7* 8.4* 8.4* 8.6* 8.8* 8.8*  MG 2.0  --   --   --   --  2.1  AST 12*  --   --   --   --   --   ALT 11  --   --   --   --   --   ALKPHOS 81  --   --   --   --   --   BILITOT 0.7  --   --   --   --   --    ------------------------------------------------------------------------------------------------------------------ No results for input(s): CHOL, HDL, LDLCALC, TRIG, CHOLHDL, LDLDIRECT in the last 72 hours.  Lab Results  Component Value Date   HGBA1C 5.9 (H) 08/23/2014   ------------------------------------------------------------------------------------------------------------------ No results for input(s): TSH, T4TOTAL, T3FREE, THYROIDAB in the last 72  hours.  Invalid input(s): FREET3 ----------------------------------------------------------------------------------------------------------------- No results for input(s): VITAMINB12, FOLATE, FERRITIN, TIBC, IRON, RETICCTPCT in the last 72 hours.  Coagulation profile No results for input(s): INR, PROTIME in the last 168 hours.  No results for input(s): DDIMER in the last 72 hours.  Cardiac Enzymes No results for input(s): CKMB, TROPONINI, MYOGLOBIN in the last 168 hours.  Invalid input(s): CK ------------------------------------------------------------------------------------------------------------------    Component Value Date/Time   BNP 354.0 (H) 01/27/2019 1006   Iline Buchinger M.D on 02/02/2019 at 11:18 AM  Go to [email or web address] - for contact info  [facility] - Office  [phone]

## 2019-02-02 NOTE — Plan of Care (Signed)
  Problem: Acute Rehab PT Goals(only PT should resolve) Goal: Pt Will Go Supine/Side To Sit Outcome: Progressing Flowsheets (Taken 02/02/2019 1404) Pt will go Supine/Side to Sit: Independently Goal: Patient Will Transfer Sit To/From Stand Outcome: Progressing Flowsheets (Taken 02/02/2019 1404) Patient will transfer sit to/from stand: with supervision Goal: Pt Will Transfer Bed To Chair/Chair To Bed Outcome: Progressing Flowsheets (Taken 02/02/2019 1404) Pt will Transfer Bed to Chair/Chair to Bed: with supervision Goal: Pt Will Ambulate Outcome: Progressing Flowsheets (Taken 02/02/2019 1404) Pt will Ambulate:  50 feet  with min guard assist  with supervision  with rolling walker  2:05 PM, 02/02/19 Lonell Grandchild, MPT Physical Therapist with [facility] [phone] office [phone] mobile phone

## 2019-02-02 NOTE — Progress Notes (Signed)
Central telemetry called RN, pt has had several episodes of afib RVR with HR increasing to 120s-130s. RN in to assess patient, she is alert/oriented, skin warm/dry, no distress. Pt denies any SOB, CP, dizziness. Dr Denton Brick notified of change in HR and patient status.

## 2019-02-03 LAB — RENAL FUNCTION PANEL
Albumin: 2.9 g/dL — ABNORMAL LOW (ref 3.5–5.0)
Anion gap: 10 (ref 5–15)
BUN: 33 mg/dL — ABNORMAL HIGH (ref 8–23)
CO2: 20 mmol/L — ABNORMAL LOW (ref 22–32)
Calcium: 9.1 mg/dL (ref 8.9–10.3)
Chloride: 108 mmol/L (ref 98–111)
Creatinine, Ser: 1.22 mg/dL — ABNORMAL HIGH (ref 0.44–1.00)
GFR calc Af Amer: 46 mL/min — ABNORMAL LOW (ref >60)
GFR calc non Af Amer: 40 mL/min — ABNORMAL LOW (ref >60)
Glucose, Bld: 99 mg/dL (ref 70–99)
Phosphorus: 3.8 mg/dL (ref 2.5–4.6)
Potassium: 4.5 mmol/L (ref 3.5–5.1)
Sodium: 138 mmol/L (ref 135–145)

## 2019-02-03 LAB — CBC
HCT: 29.8 % — ABNORMAL LOW (ref 36.0–46.0)
Hemoglobin: 9.1 g/dL — ABNORMAL LOW (ref 12.0–15.0)
MCH: 28.4 pg (ref 26.0–34.0)
MCHC: 30.5 g/dL (ref 30.0–36.0)
MCV: 93.1 fL (ref 80.0–100.0)
Platelets: 371 10*3/uL (ref 150–400)
RBC: 3.2 MIL/uL — ABNORMAL LOW (ref 3.87–5.11)
RDW: 13.2 % (ref 11.5–15.5)
WBC: 6.4 10*3/uL (ref 4.0–10.5)
nRBC: 0 % (ref 0.0–0.2)

## 2019-02-03 MED ORDER — DIGOXIN 0.25 MG/ML IJ SOLN
0.5000 mg | Freq: Once | INTRAMUSCULAR | Status: AC
  Administered 2019-02-03: 0.5 mg via INTRAVENOUS
  Filled 2019-02-03: qty 2

## 2019-02-03 MED ORDER — MAGNESIUM SULFATE 2 GM/50ML IV SOLN
2.0000 g | Freq: Once | INTRAVENOUS | Status: AC
  Administered 2019-02-03: 2 g via INTRAVENOUS
  Filled 2019-02-03: qty 50

## 2019-02-03 MED ORDER — AMIODARONE HCL 200 MG PO TABS
200.0000 mg | ORAL_TABLET | Freq: Two times a day (BID) | ORAL | Status: DC
  Administered 2019-02-03 – 2019-02-06 (×7): 200 mg via ORAL
  Filled 2019-02-03 (×7): qty 1

## 2019-02-03 MED ORDER — METOPROLOL TARTRATE 25 MG PO TABS
25.0000 mg | ORAL_TABLET | Freq: Two times a day (BID) | ORAL | Status: DC
  Administered 2019-02-03 – 2019-02-06 (×6): 25 mg via ORAL
  Filled 2019-02-03 (×6): qty 1

## 2019-02-03 MED ORDER — DIGOXIN 0.25 MG/ML IJ SOLN
0.5000 mg | Freq: Once | INTRAMUSCULAR | Status: DC
  Filled 2019-02-03: qty 2

## 2019-02-03 NOTE — Progress Notes (Addendum)
Pts HR in 120's-130's throughout night with an occasional jump to 150 non-sustained. Attending Dr. Joesph Fillers aware per day shift nurse, no new orders if not sustaining in 150's. Pt denies any pain or SOB. Will continue to monitor.

## 2019-02-03 NOTE — Progress Notes (Addendum)
Physical Therapy Treatment Patient Details Name: Renee Blake MRN: [id number] DOB: Oct 26, 1928 Today's Date: 02/03/2019    History of Present Illness Renee Blake  is a 85 y.o. 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 fib with RVR , EMS  treated her with iv Cardizem.  She had transient improvement of her heart rate from about 170 to  120.    PT Comments    Pt seated in bedside chair upon arrival, agreeable to PT. Pt attempted ambulation without AD because she was previously independent, but demonstrates unsteadiness requiring single HHA. Pt able to ambulate longer distance this date, but continues to be limited by fatigue and improved steadiness with HHA; educated pt to continue RW use to reduce risk for falls with ambulation. Pt tolerates standing and seated BLE strengthening exercises this date. Session ended early due to [facility] rep delivering pt's medical equipment. Pt left up in chair, call bell in reach and [facility] rep in room. Pt will benefit from continued physical therapy in hospital and recommendations below to increase strength, balance, endurance for safe ADLs and gait.    Follow Up Recommendations  Home health PT;Supervision for mobility/OOB;Supervision/Assistance - 24 hour     Equipment Recommendations  Rolling walker with 5" wheels;3in1 (PT);Other (comment)(Shower chair)    Recommendations for Other Services       Precautions / Restrictions Precautions Precautions: Fall Restrictions Weight Bearing Restrictions: No    Mobility  Bed Mobility               General bed mobility comments: pt up in bedside chair upon arrival  Transfers Overall transfer level: Needs  assistance Equipment used: 1 person hand held assist Transfers: Sit to/from Stand;Stand Pivot Transfers Sit to Stand: Min guard Stand pivot transfers: Min guard       General transfer comment: unsteadiness upon standing, provided hand held assist to improve balance  Ambulation/Gait Ambulation/Gait assistance: Min guard Gait Distance (Feet): 60 Feet Assistive device: 1 person hand held assist Gait Pattern/deviations: Decreased step length - right;Decreased step length - left;Decreased stride length Gait velocity: decreased   General Gait Details: decreased cadence, slight unsteadiness without assistance improving with single hand held assist, labored cadence and limited secondary to fatigue, no near falls with gait training   Stairs             Wheelchair Mobility    Modified Rankin (Stroke Patients Only)       Balance Overall balance assessment: Needs assistance Sitting-balance support: Feet supported;No upper extremity supported Sitting balance-Leahy Scale: Good Sitting balance - Comments: seated at edge of bedside chair   Standing balance support: During functional activity;Single extremity supported Standing balance-Leahy Scale: Fair Standing balance comment: single HHA                            Cognition Arousal/Alertness: Awake/alert Behavior During Therapy: WFL for tasks assessed/performed Overall Cognitive Status: Within Functional Limits for tasks assessed  Exercises General Exercises - Lower Extremity Long Arc Quad: Seated;Both;10 reps Heel Raises: Standing;Both;10 reps(hand held assist on bathroom sink)    General Comments        Pertinent Vitals/Pain Pain Assessment: No/denies pain    Home Living                      Prior Function            PT Goals (current goals can now be found in the care plan section) Acute Rehab PT Goals Patient Stated Goal: Return  home with family to assist PT Goal Formulation: With patient/family Time For Goal Achievement: 02/06/19 Potential to Achieve Goals: Good Progress towards PT goals: Progressing toward goals    Frequency    Min 3X/week      PT Plan Current plan remains appropriate    Co-evaluation              AM-PAC PT "6 Clicks" Mobility   Outcome Measure  Help needed turning from your back to your side while in a flat bed without using bedrails?: None Help needed moving from lying on your back to sitting on the side of a flat bed without using bedrails?: None Help needed moving to and from a bed to a chair (including a wheelchair)?: A Little Help needed standing up from a chair using your arms (e.g., wheelchair or bedside chair)?: A Little Help needed to walk in hospital room?: A Little Help needed climbing 3-5 steps with a railing? : A Lot 6 Click Score: 19    End of Session Equipment Utilized During Treatment: Gait belt Activity Tolerance: Patient tolerated treatment well;Patient limited by fatigue Patient left: in chair;with call bell/phone within reach;with nursing/sitter in room(HH rep in room delivering equipment) Nurse Communication: Mobility status PT Visit Diagnosis: Unsteadiness on feet (R26.81);Other abnormalities of gait and mobility (R26.89);Muscle weakness (generalized) (M62.81)     Time: [id number] PT Time Calculation (min) (ACUTE ONLY): 9 min  Charges:  $Therapeutic Exercise: 8-22 mins                      Tori Kyli Sorter PT, DPT 02/03/19, 2:17 PM [phone]

## 2019-02-03 NOTE — TOC Progression Note (Signed)
Transition of Care [facility]) - Progression Note    Patient Details  Name: Renee Blake MRN: [id number] Date of Birth: 1928-06-17  [facility] of [facility] CM/SW Contact  Ihor Gully, LCSW Phone Number: 02/03/2019, 3:03 PM  Clinical Narrative:    DME provider rep, Blake Divine, spoke with patient about DME recommendations and patient declined. Patient indicated that [name] had a RW and she had a 3n1 from her deceased spouse.    Expected Discharge Plan: [place] Barriers to Discharge: Continued Medical Work up  Expected Discharge Plan and Services Expected Discharge Plan: [place] arrangements for the past 2 months: Single Family Home                                       Social Determinants of Health (SDOH) Interventions    Readmission Risk Interventions No flowsheet data found.

## 2019-02-03 NOTE — Progress Notes (Signed)
Patient Demographics:    Renee Blake, is a 85 y.o. female, DOB - [birth year], [id number]  Admit date - 01/27/2019   Admitting Physician Loanne Emery Denton Brick, MD  Outpatient Primary MD for the patient is [name], MD  LOS - 7  Chief Complaint  Patient presents with  . Chest Pain        Subjective:    [name] today has no fevers, no emesis,  No chest pain,   -Overnight continues to have burst  of A. fib with RVR  Assessment  & Plan :    Principal Problem:   Acute exacerbation of CHF (congestive heart failure) (HCC) Active Problems:   New onset a-fib with RVR   Iron deficiency anemia due to chronic blood loss---H/o Gi Bleed   H/o Prior Lower GI bleed (anorectal Ca)   H/o Prior duodenal ulcer with hemorrhage   Hypertension  Brief Summary:- 85 y.o. 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 fib with RVR  A/p  1)New Onset Afib--- with heart rate up to the 170s on admission -Patient converted to [place] on 01/28/2019, went back to A. fib with RVR 01/29/19 -Off Cardizem drip -Rate control remains challenging, -Per cardiology start amiodarone 200 twice daily on 02/03/2019 -c/n Cardizem to 30 mg every 6 hours,  C/n  metoprolol to 12.5 mg twice daily-watch BP closely   -Echo with preserved EF of 50 to 65% with grade 2 diastolic dysfunction CHF and severely dilated left atrium--so patient is unlikely to stay in sinus rhythm if she converts -TSH-1.74, magnesium wnl, potassium wnl -Cardiology consult appreciated -As per Dr. Jenkins Rouge--- aspirin probably not beneficial, CHA2DS2- VASc score   is =  5 (age (2), HTN, CHF, Female)  Which is  equal to = 7.2 % annual risk of  stroke -However patient HAS Bled score is elevated due to Age AND history of prior Major bleeding duodenal ulcer and prior lower GI bleed from an a rectal carcinoma with chronic iron deficiency anemia- secondary to presumed chronic ongoing GI blood loss - she is not an ideal candidate for FULL Anticoagulation -Inability to anticoagulate precludes cardioversion   2)HFpEF--patient with history of prior chronic diastolic dysfunction CHF -Echo with EF of 60 to 65% with grade 2 diastolic dysfunction  - acute on chronic diastolic dysfunction CHF exacerbation due to A. fib with RVR --Patient has diuresed well, -CHF is improved,c/n  Lasix to 20 mg daily due to soft BP -Monitor daily weights and fluid input and output -Low-salt diet -- 3)Chronic iron deficiency anemia--Hgb is 9.1 which is close to patient's baseline -No evidence of significant ongoing bleeding at this time continue to monitor and transfuse as clinically indicated -history of prior Major bleeding duodenal ulcer and prior lower GI bleed from an a rectal carcinoma with chronic iron deficiency anemia- Presumed secondary to chronic ongoing GI blood loss  4)HTN--blood pressure somewhat soft due to A. fib with RVR, discontinued amlodipine,  -Stopped losartan as creatinine is trending up and to allow room for rate control medications- -continue p.o. Cardizem and  p.o. metoprolol as above in #1  5)H/o Anorectal Carcinoma--- S/p prior Radiation-- recent PET scan Noted.  Patient gets her radiation oncology, GYN and medical oncology care from [facility] through [place] of care and advanced directives as well as CODE STATUS discussed with patient and patient's daughters x 2  -Patient is a full code  7)AKI----acute kidney injury  -     creatinine on admission= 1.0 ,   baseline creatinine = 0.8   , creatinine is now= 1.22  , renally adjust medications, avoid nephrotoxic agents/dehydration/hypotension -Stopped losartan  8)Acute  hypoxic respiratory failure--- secondary to dCHF flareup in the setting of A. fib with RVR--- improving with diuresis and rate control, oxygen requirement down to 2 L  -Repeat chest x-ray from 11//2020 with improvement - 9)Generalized weakness/deconditioning--will get PT eval once rate control improves   Disposition/Need for in-Hospital Stay- patient unable to be discharged at this time due to --CHF exacerbation due to A. fib with RVR requiring further titration of medications in order to achieve rate control and diuresis with close monitoring of electrolytes and renal function  Code Status : Full code  Family Communication:   (patient is alert, awake and coherent) -Discussed with daughterVelva Harman and daughterMarlowe Kays  Disposition Plan  : Await PT eval once rate control improves Consults  :  Cardiology DVT Prophylaxis  :   - Heparin - SCDs   Lab Results  Component Value Date   PLT 371 02/03/2019    Inpatient Medications  Scheduled Meds: . amiodarone  200 mg Oral BID  . Chlorhexidine Gluconate Cloth  6 each Topical Daily  . diltiazem  30 mg Oral Q6H  . furosemide  20 mg Oral Daily  . heparin  5,000 Units Subcutaneous Q8H  . loratadine  10 mg Oral QPM  . mouth rinse  15 mL Mouth Rinse BID  . metoprolol tartrate  25 mg Oral BID  . pantoprazole  40 mg Oral Daily  . potassium chloride SA  20 mEq Oral BID  . sodium chloride flush  3 mL Intravenous Q12H  . traZODone  50 mg Oral QHS   Continuous Infusions: . sodium chloride     PRN Meds:.sodium chloride, acetaminophen **OR** acetaminophen, albuterol, ALPRAZolam, alum & mag hydroxide-simeth, dicyclomine, loperamide, meclizine, ondansetron **OR** ondansetron (ZOFRAN) IV, ondansetron, polyethylene glycol, sodium chloride flush   Anti-infectives (From admission, onward)   None       Objective:   Vitals:   02/03/19 0500 02/03/19 0544 02/03/19 1302 02/03/19 1500  BP:  (!) 104/54 107/70 140/80  Pulse:  87 70   Resp:  16 18    Temp:  97.6 F (36.4 C) (!) 97.5 F (36.4 C)   TempSrc:  Oral Oral   SpO2:  97% 99%   Weight: 54.9 kg     Height:        Wt Readings from Last 3 Encounters:  02/03/19 54.9 kg  07/25/17 52.6 kg  07/22/17 51.7 kg    Intake/Output Summary (Last 24 hours) at 02/03/2019 1931 Last data filed at 02/03/2019 1300 Gross per 24 hour  Intake 480 ml  Output -  Net 480 ml   Physical Exam Gen:- Awake Alert, in no apparent distress, No conversational dyspnea  HEENT:- [place].AT, No sclera icterus Nose- [place] 2L/min Neck-Supple Neck, +ve JVD,.  Lungs-improving air movement, no wheezing  CV- S1, S2 normal, irregularly irregular, tachycardic abd-  +ve B.Sounds, Abd Soft, No tenderness,    Extremity/Skin:- No  edema, pedal pulses  present  Psych-affect is appropriate, oriented x3 Neuro-generalized weakness, no new focal deficits, no tremors   Data Review:   Micro Results Recent Results (from the past 240 hour(s))  SARS CORONAVIRUS 2 (TAT 6-24 HRS) Nasopharyngeal Nasopharyngeal Swab     Status: None   Collection Time: 01/27/19 11:13 AM   Specimen: Nasopharyngeal Swab  Result Value Ref Range Status   SARS Coronavirus 2 NEGATIVE NEGATIVE Final    Comment: (NOTE) SARS-CoV-2 target nucleic acids are NOT DETECTED. The SARS-CoV-2 RNA is generally detectable in upper and lower respiratory specimens during the acute phase of infection. Negative results do not preclude SARS-CoV-2 infection, do not rule out co-infections with other pathogens, and should not be used as the sole basis for treatment or other patient management decisions. Negative results must be combined with clinical observations, patient history, and epidemiological information. The expected result is Negative. Fact Sheet for Patients: SugarRoll.be Fact Sheet for Healthcare Providers: https://www.woods-mathews.com/ This test is not yet approved or cleared by the [place] FDA and  has been  authorized for detection and/or diagnosis of SARS-CoV-2 by FDA under an Emergency Use Authorization (EUA). This EUA will remain  in effect (meaning this test can be used) for the duration of the COVID-19 declaration under Section 56 4(b)(1) of the Act, 21 U.S.C. section 360bbb-3(b)(1), unless the authorization is terminated or revoked sooner. Performed at [facility], [place] [address], [place], [place] [address]   MRSA PCR Screening     Status: None   Collection Time: 01/27/19  6:33 PM   Specimen: Nasopharyngeal  Result Value Ref Range Status   MRSA by PCR NEGATIVE NEGATIVE Final    Comment:        The GeneXpert MRSA Assay (FDA approved for NASAL specimens only), is one component of a comprehensive MRSA colonization surveillance program. It is not intended to diagnose MRSA infection nor to guide or monitor treatment for MRSA infections. Performed at [facility], [address] [place], [place] [address]     Radiology Reports Dg Chest [place] 1 View  Result Date: 01/31/2019 CLINICAL DATA:  Congestive heart failure. EXAM: PORTABLE CHEST 1 VIEW COMPARISON:  Chest x-rays dated 01/27/2019 and 10/27/2014. FINDINGS: Decreased interstitial edema bilaterally suggesting improved fluid status. Coarse lung markings remain bilaterally indicating some degree of underlying chronic interstitial lung disease and/or chronic bronchitic change, with associated lung hyperexpansion. No confluent opacity to suggest a pneumonia. No pleural effusion or pneumothorax is seen. Heart size is upper normal. Overall cardiomediastinal silhouette is stable. Osseous structures about the chest are unremarkable. IMPRESSION: 1. Decreased interstitial edema bilaterally suggesting improved fluid status. 2. Hyperexpanded lungs indicating COPD. Probable associated chronic interstitial lung disease and/or chronic bronchitic change. Electronically Signed   By: Franki Cabot M.D.   On: 01/31/2019 08:52   Dg Chest Portable 1  View  Result Date: 01/27/2019 CLINICAL DATA:  Nausea and chest pain today.  Atrial fibrillation. EXAM: PORTABLE CHEST 1 VIEW COMPARISON:  PA and lateral chest 10/27/2014. FINDINGS: There is cardiomegaly and pulmonary edema. Small to moderate pleural effusions are seen, larger on the left. Atherosclerosis noted. No acute or focal bony abnormality. IMPRESSION: Congestive heart failure. Electronically Signed   By: Inge Rise M.D.   On: 01/27/2019 10:54     CBC Recent Labs  Lab 01/28/19 0358 01/30/19 0457 02/02/19 0407 02/03/19 0441  WBC 10.7* 6.4 6.0 6.4  HGB 9.7* 9.2* 9.7* 9.1*  HCT 31.0* 29.6* 31.0* 29.8*  PLT 323 287 375 371  MCV  93.1 91.6 92.0 93.1  MCH 29.1 28.5 28.8 28.4  MCHC 31.3 31.1 31.3 30.5  RDW 13.4 13.0 13.2 13.2   Chemistries  Recent Labs  Lab 01/29/19 0943 01/30/19 0457 01/31/19 0440 02/02/19 0407 02/03/19 0441  NA 135 136 136 138 138  K 3.9 4.2 4.4 4.3 4.5  CL 102 103 103 109 108  CO2 22 22 24  20* 20*  GLUCOSE 134* 100* 108* 104* 99  BUN 21 27* 28* 32* 33*  CREATININE 1.31* 1.29* 1.29* 1.21* 1.22*  CALCIUM 8.4* 8.6* 8.8* 8.8* 9.1  MG  --   --   --  2.1  --    ------------------------------------------------------------------------------------------------------------------ No results for input(s): CHOL, HDL, LDLCALC, TRIG, CHOLHDL, LDLDIRECT in the last 72 hours.  Lab Results  Component Value Date   HGBA1C 5.9 (H) 08/23/2014   ------------------------------------------------------------------------------------------------------------------ No results for input(s): TSH, T4TOTAL, T3FREE, THYROIDAB in the last 72 hours.  Invalid input(s): FREET3 ----------------------------------------------------------------------------------------------------------------- No results for input(s): VITAMINB12, FOLATE, FERRITIN, TIBC, IRON, RETICCTPCT in the last 72 hours.  Coagulation profile No results for input(s): INR, PROTIME in the last 168 hours.  No  results for input(s): DDIMER in the last 72 hours.  Cardiac Enzymes No results for input(s): CKMB, TROPONINI, MYOGLOBIN in the last 168 hours.  Invalid input(s): CK ------------------------------------------------------------------------------------------------------------------    Component Value Date/Time   BNP 354.0 (H) 01/27/2019 1006   Roxan Hockey M.D on 02/03/2019 at 7:31 PM  Go to [email or web address] - for contact info  [facility] - Office  [phone]

## 2019-02-03 NOTE — Progress Notes (Signed)
TRH night shift.  The patient has been tachycardic in the 120's and 130s during the night shift, even while sleeping. She is on Cardizem, but her BP is soft.  Will try a dose of digoxin and magnesium sulfate 2 g IVPB.  If persistent, will have to consider amiodarone infusion.  Tennis Must, MD

## 2019-02-03 NOTE — TOC Progression Note (Signed)
Transition of Care [facility]) - Progression Note    Patient Details  Name: Renee Blake MRN: [id number] Date of Birth: [birth year]  Transition of Care [facility]) CM/SW Contact  Ihor Gully, LCSW Phone Number: 02/03/2019, 4:45 PM  Clinical Narrative:    Daughter, [name], discussed that patient does need DME and had spoken with DME company who will mail 3n1 and RW. Confirmed with Blake Divine that DME will be mailed to daughter. [place] providers choices provided from [employer]. Patient has had [facility] in the past and will family selects them again.   Expected Discharge Plan: [place] Barriers to Discharge: Continued Medical Work up  Expected Discharge Plan and Services Expected Discharge Plan: [place] arrangements for the past 2 months: [place]: PT [place]: [facility] ([place]) Date [place]: 02/03/19 Time [place]: [place] Representative spoke with at [place]: Romualdo Bolk   Social Determinants of Health ([place]) Interventions    Readmission Risk Interventions No flowsheet data found.

## 2019-02-03 NOTE — Care Management Important Message (Signed)
Important Message  Patient Details  Name: Renee Blake MRN: [id number] Date of Birth: 21-Jul-1928   Medicare Important Message Given:  Yes     [name] 02/03/2019, 11:56 AM

## 2019-02-03 NOTE — Progress Notes (Signed)
Progress Note  Patient Name: Renee Blake Date of Encounter: 02/03/2019  Primary Cardiologist: Kate Sable, MD  Subjective   Now on telemetry floor.  No palpitations or chest pain as before.  She did receive IV Lanoxin overnight due to elevated heart rate and atrial fibrillation.  Inpatient Medications    Scheduled Meds: . amiodarone  200 mg Oral BID  . Chlorhexidine Gluconate Cloth  6 each Topical Daily  . diltiazem  30 mg Oral Q6H  . furosemide  20 mg Oral Daily  . heparin  5,000 Units Subcutaneous Q8H  . loratadine  10 mg Oral QPM  . mouth rinse  15 mL Mouth Rinse BID  . metoprolol tartrate  25 mg Oral BID  . pantoprazole  40 mg Oral Daily  . potassium chloride SA  20 mEq Oral BID  . sodium chloride flush  3 mL Intravenous Q12H  . traZODone  50 mg Oral QHS   Continuous Infusions: . sodium chloride     PRN Meds: sodium chloride, acetaminophen **OR** acetaminophen, albuterol, ALPRAZolam, alum & mag hydroxide-simeth, dicyclomine, loperamide, meclizine, ondansetron **OR** ondansetron (ZOFRAN) IV, ondansetron, polyethylene glycol, sodium chloride flush   Vital Signs    Vitals:   02/02/19 2112 02/03/19 0340 02/03/19 0500 02/03/19 0544  BP: 122/70 103/75  (!) 104/54  Pulse: (!) 106 80  87  Resp: 16   16  Temp: (!) 97.4 F (36.3 C)   97.6 F (36.4 C)  TempSrc: Oral   Oral  SpO2: 99%   97%  Weight:   54.9 kg   Height:        Intake/Output Summary (Last 24 hours) at 02/03/2019 1007 Last data filed at 02/02/2019 1700 Gross per 24 hour  Intake 360 ml  Output -  Net 360 ml   Filed Weights   02/01/19 0500 02/02/19 0940 02/03/19 0500  Weight: 53.6 kg 52.2 kg 54.9 kg    Telemetry    Possible course atrial fibrillation.  Personally reviewed.  ECG    An ECG dated 02/01/2019 was personally reviewed today and demonstrated:  Course atrial fibrillation versus atypical atrial flutter with variable conduction, left anterior fascicular block, diffuse  repolarization abnormalities.  Physical Exam   GEN:  Frail-appearing elderly woman, no acute distress.   Neck: No JVD. Cardiac:  Irregularly irregular, no murmur, rub, or gallop.  Respiratory: Nonlabored.  Decreased breath sounds. GI: Soft, nontender, bowel sounds present. MS: No edema; No deformity. Neuro:  Nonfocal. Psych: Alert and oriented x 3. Normal affect.  Labs    Chemistry Recent Labs  Lab 01/30/19 0457 01/31/19 0440 02/02/19 0407 02/03/19 0441  NA 136 136 138 138  K 4.2 4.4 4.3 4.5  CL 103 103 109 108  CO2 22 24 20* 20*  GLUCOSE 100* 108* 104* 99  BUN 27* 28* 32* 33*  CREATININE 1.29* 1.29* 1.21* 1.22*  CALCIUM 8.6* 8.8* 8.8* 9.1  ALBUMIN 2.8*  --  2.7* 2.9*  GFRNONAA 37* 37* 40* 40*  GFRAA 43* 43* 46* 46*  ANIONGAP 11 9 9 10      Hematology Recent Labs  Lab 01/30/19 0457 02/02/19 0407 02/03/19 0441  WBC 6.4 6.0 6.4  RBC 3.23* 3.37* 3.20*  HGB 9.2* 9.7* 9.1*  HCT 29.6* 31.0* 29.8*  MCV 91.6 92.0 93.1  MCH 28.5 28.8 28.4  MCHC 31.1 31.3 30.5  RDW 13.0 13.2 13.2  PLT 287 375 371    Radiology    No results found.  Cardiac Studies   Echocardiogram  01/28/2019: 1. Left ventricular ejection fraction, by visual estimation, is 60 to 65%. The left ventricle has normal function. There is no left ventricular hypertrophy. 2. Elevated left atrial pressure. 3. Left ventricular diastolic parameters are consistent with Grade II diastolic dysfunction (pseudonormalization). 4. Global right ventricle has normal systolic function.The right ventricular size is normal. No increase in right ventricular wall thickness. 5. Left atrial size was severely dilated. 6. Right atrial size was mildly dilated. 7. The mitral valve is abnormal. Moderate mitral valve regurgitation. No evidence of mitral stenosis. 8. The MR vena contracta is 0.6 cm. The MV/AV TVI ratio is 1.1. Findings support moderate mitral regurgitation. 9. The tricuspid valve is normal in structure.  Tricuspid valve regurgitation is mild. 10. The aortic valve is tricuspid. Aortic valve regurgitation is not visualized. No evidence of aortic valve sclerosis or stenosis. 11. The pulmonic valve was not well visualized. Pulmonic valve regurgitation is mild. 12. Moderately elevated pulmonary artery systolic pressure. 13. The inferior vena cava is normal in size with greater than 50% respiratory variability, suggesting right atrial pressure of 3 mmHg.  Patient Profile     85 y.o. female with a history of hypertension, GI bleed with prior duodenal ulcer, vaginal versus anorectal cancer, esophageal stricture, chronic anemia, and chronic diastolic heart failure being followed for management of atrial fibrillation.  Assessment & Plan    1.  Coarse atrial fibrillation versus atypical flutter with RVR.  Heart rate still not well controlled and AV nodal blockers have been limited by relatively low blood pressure.  Anticoagulation is not being pursued based on bleeding risk and evaluation by Dr. Bronson Ing.  Left atrium is severely dilated as well with moderate mitral regurgitation making the likelihood of maintaining sinus rhythm low.  2.  Acute on chronic diastolic heart failure.  She has diuresed well and Lasix dose reduced.  3.  Essential hypertension, blood pressure running on the lower side at this point.  Discussed with hospitalist team.  Continue current dose of Cardizem, increase Lopressor to 25 mg twice daily, and start amiodarone 200 mg twice daily which will hopefully help with further rate control without additional blood pressure reduction.  Signed, Rozann Lesches, MD  02/03/2019, 10:07 AM

## 2019-02-03 NOTE — Progress Notes (Signed)
Notified Dr. Olevia Bowens regarding HR (see MD note) Administered IV digoxin and magnesium sulfate as ordered. HR 90's-low 100s at this time. Will continue to monitor.

## 2019-02-04 DIAGNOSIS — I4819 Other persistent atrial fibrillation: Secondary | ICD-10-CM

## 2019-02-04 MED ORDER — DILTIAZEM HCL ER COATED BEADS 120 MG PO CP24
120.0000 mg | ORAL_CAPSULE | Freq: Every day | ORAL | Status: DC
  Administered 2019-02-05 – 2019-02-06 (×2): 120 mg via ORAL
  Filled 2019-02-04 (×3): qty 1

## 2019-02-04 NOTE — Progress Notes (Signed)
Patient Demographics:    Renee Blake, is a 85 y.o. female, DOB - 27-Jan-1929, [id number]  Admit date - 01/27/2019   Admitting Physician Chairty Toman Denton Brick, MD  Outpatient Primary MD for the patient is [name], MD  LOS - 8  Chief Complaint  Patient presents with  . Chest Pain        Subjective:    [name] today has no fevers, no emesis,  No chest pain,   -Some dyspnea on exertion, no significant dizziness or palpitations -Ambulated with mild tachycardia (previously had significant tachycardia with minimal activity)  Assessment  & Plan :    Principal Problem:   Acute exacerbation of CHF (congestive heart failure) (HCC) Active Problems:   New onset a-fib with RVR   Iron deficiency anemia due to chronic blood loss---H/o Gi Bleed   H/o Prior Lower GI bleed (anorectal Ca)   H/o Prior duodenal ulcer with hemorrhage   Hypertension  Brief Summary:- 85 y.o. 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 fib with RVR  A/p  1)New Onset Afib--- with heart rate up to the 170s on admission -Patient converted to [place] on 01/28/2019, went back to A. fib with RVR 01/29/19 -Off Cardizem drip -Rate control remains challenging, -Per cardiology start amiodarone 200 twice daily on 02/03/2019 -c/n Cardizem to 30 mg every 6 hours,  C/n  metoprolol to 12.5 mg twice daily-watch BP closely  -If patient continues to improve from a heart rate control standpoint plan is to transition to Cardizem CD 120 mg daily on 02/05/2019, Lopressor 25 mg twice daily on 02/05/2019 and continue amiodarone 200 mg twice daily for 7 days and then decrease to 200 mg daily after that--outpatient follow-up with Dr. Bronson Ing  advised -Echo with preserved EF of 50 to 65% with grade 2 diastolic dysfunction CHF and severely dilated left atrium--so patient is unlikely to stay in sinus rhythm if she converts -TSH-1.74, magnesium wnl, potassium wnl -Cardiology consult appreciated -As per Dr. Jenkins Rouge--- aspirin probably not beneficial, CHA2DS2- VASc score   is =  5 (age (2), HTN, CHF, Female)  Which is  equal to = 7.2 % annual risk of stroke -However patient HAS Bled score is elevated due to Age AND history of prior Major bleeding duodenal ulcer and prior lower GI bleed from an a rectal carcinoma with chronic iron deficiency anemia- secondary to presumed chronic ongoing GI blood loss - she is not an ideal candidate for FULL Anticoagulation -Inability to anticoagulate precludes cardioversion   2)HFpEF--patient with history of prior chronic diastolic dysfunction CHF -Echo with EF of 60 to 65% with grade 2 diastolic dysfunction  - acute on chronic diastolic dysfunction CHF exacerbation due to A. fib with RVR --Patient has diuresed well, -CHF is improved,c/n  Lasix to 20 mg daily due to soft BP -Monitor daily weights and fluid input and output -Low-salt diet -- 3)Chronic iron deficiency anemia--Hgb is 9.1 which is close to patient's baseline -No evidence of significant ongoing bleeding at this time continue to monitor and transfuse as clinically indicated -history  of prior Major bleeding duodenal ulcer and prior lower GI bleed from an a rectal carcinoma with chronic iron deficiency anemia- Presumed secondary to chronic ongoing GI blood loss  4)HTN--blood pressure somewhat soft due to A. fib with RVR, discontinued amlodipine,  -Stopped losartan as creatinine is trending up and to allow room for rate control medications- -continue p.o. Cardizem and p.o. metoprolol as above in #1  5)H/o Anorectal Carcinoma--- S/p prior Radiation-- recent PET scan Noted.  Patient gets her radiation oncology, GYN and medical oncology  care from [facility] through [place] of care and advanced directives as well as CODE STATUS discussed with patient and patient's daughters x 2  -Patient is a full code  7)AKI----acute kidney injury  -     creatinine on admission= 1.0 ,   baseline creatinine = 0.8   , creatinine is now= 1.22  , renally adjust medications, avoid nephrotoxic agents/dehydration/hypotension -Stopped losartan  8)Acute hypoxic respiratory failure--- secondary to dCHF flareup in the setting of A. fib with RVR--- improving with diuresis and rate control, oxygen requirement down to 2 L  -Repeat chest x-ray from 11//2020 with improvement - 9)Generalized weakness/deconditioning--- PT eval appreciated, recommends home health PT -Rolling walker and 3 N 1 commode, as well as shower chair  Disposition/Need for in-Hospital Stay- patient unable to be discharged at this time due to --CHF exacerbation due to A. fib with RVR requiring further titration of medications in order to achieve rate control and diuresis with close monitoring of electrolytes and renal function  Code Status : Full code  Family Communication:   (patient is alert, awake and coherent) -Discussed with daughter- [name] and daughterMarlowe Kays  Disposition Plan  : Await PT eval once rate control improves Consults  :  Cardiology DVT Prophylaxis  :   - Heparin - SCDs   Lab Results  Component Value Date   PLT 371 02/03/2019    Inpatient Medications  Scheduled Meds: . amiodarone  200 mg Oral BID  . Chlorhexidine Gluconate Cloth  6 each Topical Daily  . [START ON 02/05/2019] diltiazem  120 mg Oral Daily  . diltiazem  30 mg Oral Q6H  . furosemide  20 mg Oral Daily  . heparin  5,000 Units Subcutaneous Q8H  . loratadine  10 mg Oral QPM  . mouth rinse  15 mL Mouth Rinse BID  . metoprolol tartrate  25 mg Oral BID  . pantoprazole  40 mg Oral Daily  . potassium chloride SA  20 mEq Oral BID  . sodium chloride flush  3 mL Intravenous Q12H  .  traZODone  50 mg Oral QHS   Continuous Infusions: . sodium chloride     PRN Meds:.sodium chloride, acetaminophen **OR** acetaminophen, albuterol, ALPRAZolam, alum & mag hydroxide-simeth, dicyclomine, loperamide, meclizine, ondansetron **OR** ondansetron (ZOFRAN) IV, ondansetron, polyethylene glycol, sodium chloride flush   Anti-infectives (From admission, onward)   None       Objective:   Vitals:   02/04/19 0500 02/04/19 0503 02/04/19 0811 02/04/19 1044  BP:  107/72  139/79  Pulse:  (!) 105  95  Resp:  16    Temp:  97.6 F (36.4 C)    TempSrc:  Oral    SpO2:  97% 96%   Weight: 55.1 kg     Height:        Wt Readings from Last 3 Encounters:  02/04/19 55.1 kg  07/25/17 52.6 kg  07/22/17 51.7 kg   No intake or output data in the 24  hours ending 02/04/19 1458 Physical Exam Gen:- Awake Alert, in no apparent distress, No conversational dyspnea  HEENT:- [place].AT, No sclera icterus Neck-Supple Neck, +ve JVD,.  Lungs-improving air movement, no wheezing  CV- S1, S2 normal, irregularly irregular, abd-  +ve B.Sounds, Abd Soft, No tenderness,    Extremity/Skin:- No  edema, pedal pulses present  Psych-affect is appropriate, oriented x3 Neuro-generalized weakness, no new focal deficits, no tremors   Data Review:   Micro Results Recent Results (from the past 240 hour(s))  SARS CORONAVIRUS 2 (TAT 6-24 HRS) Nasopharyngeal Nasopharyngeal Swab     Status: None   Collection Time: 01/27/19 11:13 AM   Specimen: Nasopharyngeal Swab  Result Value Ref Range Status   SARS Coronavirus 2 NEGATIVE NEGATIVE Final    Comment: (NOTE) SARS-CoV-2 target nucleic acids are NOT DETECTED. The SARS-CoV-2 RNA is generally detectable in upper and lower respiratory specimens during the acute phase of infection. Negative results do not preclude SARS-CoV-2 infection, do not rule out co-infections with other pathogens, and should not be used as the sole basis for treatment or other patient management  decisions. Negative results must be combined with clinical observations, patient history, and epidemiological information. The expected result is Negative. Fact Sheet for Patients: SugarRoll.be Fact Sheet for Healthcare Providers: https://www.woods-mathews.com/ This test is not yet approved or cleared by the [place] FDA and  has been authorized for detection and/or diagnosis of SARS-CoV-2 by FDA under an Emergency Use Authorization (EUA). This EUA will remain  in effect (meaning this test can be used) for the duration of the COVID-19 declaration under Section 56 4(b)(1) of the Act, 21 U.S.C. section 360bbb-3(b)(1), unless the authorization is terminated or revoked sooner. Performed at [facility], [place] [address], [place], [place] [address]   MRSA PCR Screening     Status: None   Collection Time: 01/27/19  6:33 PM   Specimen: Nasopharyngeal  Result Value Ref Range Status   MRSA by PCR NEGATIVE NEGATIVE Final    Comment:        The GeneXpert MRSA Assay (FDA approved for NASAL specimens only), is one component of a comprehensive MRSA colonization surveillance program. It is not intended to diagnose MRSA infection nor to guide or monitor treatment for MRSA infections. Performed at [facility], [address] [place], [place] [address]     Radiology Reports Dg Chest [place] 1 View  Result Date: 01/31/2019 CLINICAL DATA:  Congestive heart failure. EXAM: PORTABLE CHEST 1 VIEW COMPARISON:  Chest x-rays dated 01/27/2019 and 10/27/2014. FINDINGS: Decreased interstitial edema bilaterally suggesting improved fluid status. Coarse lung markings remain bilaterally indicating some degree of underlying chronic interstitial lung disease and/or chronic bronchitic change, with associated lung hyperexpansion. No confluent opacity to suggest a pneumonia. No pleural effusion or pneumothorax is seen. Heart size is upper normal. Overall  cardiomediastinal silhouette is stable. Osseous structures about the chest are unremarkable. IMPRESSION: 1. Decreased interstitial edema bilaterally suggesting improved fluid status. 2. Hyperexpanded lungs indicating COPD. Probable associated chronic interstitial lung disease and/or chronic bronchitic change. Electronically Signed   By: Franki Cabot M.D.   On: 01/31/2019 08:52   Dg Chest Portable 1 View  Result Date: 01/27/2019 CLINICAL DATA:  Nausea and chest pain today.  Atrial fibrillation. EXAM: PORTABLE CHEST 1 VIEW COMPARISON:  PA and lateral chest 10/27/2014. FINDINGS: There is cardiomegaly and pulmonary edema. Small to moderate pleural effusions are seen, larger on the left. Atherosclerosis noted. No acute or focal bony abnormality. IMPRESSION: Congestive heart failure. Electronically  Signed   By: Inge Rise M.D.   On: 01/27/2019 10:54     CBC Recent Labs  Lab 01/30/19 0457 02/02/19 0407 02/03/19 0441  WBC 6.4 6.0 6.4  HGB 9.2* 9.7* 9.1*  HCT 29.6* 31.0* 29.8*  PLT 287 375 371  MCV 91.6 92.0 93.1  MCH 28.5 28.8 28.4  MCHC 31.1 31.3 30.5  RDW 13.0 13.2 13.2   Chemistries  Recent Labs  Lab 01/29/19 0943 01/30/19 0457 01/31/19 0440 02/02/19 0407 02/03/19 0441  NA 135 136 136 138 138  K 3.9 4.2 4.4 4.3 4.5  CL 102 103 103 109 108  CO2 22 22 24  20* 20*  GLUCOSE 134* 100* 108* 104* 99  BUN 21 27* 28* 32* 33*  CREATININE 1.31* 1.29* 1.29* 1.21* 1.22*  CALCIUM 8.4* 8.6* 8.8* 8.8* 9.1  MG  --   --   --  2.1  --    ------------------------------------------------------------------------------------------------------------------ No results for input(s): CHOL, HDL, LDLCALC, TRIG, CHOLHDL, LDLDIRECT in the last 72 hours.  Lab Results  Component Value Date   HGBA1C 5.9 (H) 08/23/2014   ------------------------------------------------------------------------------------------------------------------ No results for input(s): TSH, T4TOTAL, T3FREE, THYROIDAB in the last  72 hours.  Invalid input(s): FREET3 ----------------------------------------------------------------------------------------------------------------- No results for input(s): VITAMINB12, FOLATE, FERRITIN, TIBC, IRON, RETICCTPCT in the last 72 hours.  Coagulation profile No results for input(s): INR, PROTIME in the last 168 hours.  No results for input(s): DDIMER in the last 72 hours.  Cardiac Enzymes No results for input(s): CKMB, TROPONINI, MYOGLOBIN in the last 168 hours.  Invalid input(s): CK ------------------------------------------------------------------------------------------------------------------    Component Value Date/Time   BNP 354.0 (H) 01/27/2019 1006   Naylah Cork M.D on 02/04/2019 at 2:58 PM  Go to [email or web address] - for contact info  [facility] - Office  [phone]

## 2019-02-04 NOTE — Progress Notes (Signed)
Patient's HR 104 and O2 saturation 99 % on room air. Patient ambulated from her room to the nursing station and HR was elevated at 134 with O2 saturation 100%. Patient complained of fatigue during ambulation and was escorted back to her room.

## 2019-02-04 NOTE — Progress Notes (Signed)
Progress Note  Patient Name: Renee Blake Date of Encounter: 02/04/2019  Primary Cardiologist: Kate Sable, MD  Subjective   Has been able to ambulate with better heart rate control.  No chest pain.  Inpatient Medications    Scheduled Meds: . amiodarone  200 mg Oral BID  . Chlorhexidine Gluconate Cloth  6 each Topical Daily  . diltiazem  30 mg Oral Q6H  . furosemide  20 mg Oral Daily  . heparin  5,000 Units Subcutaneous Q8H  . loratadine  10 mg Oral QPM  . mouth rinse  15 mL Mouth Rinse BID  . metoprolol tartrate  25 mg Oral BID  . pantoprazole  40 mg Oral Daily  . potassium chloride SA  20 mEq Oral BID  . sodium chloride flush  3 mL Intravenous Q12H  . traZODone  50 mg Oral QHS   Continuous Infusions: . sodium chloride     PRN Meds: sodium chloride, acetaminophen **OR** acetaminophen, albuterol, ALPRAZolam, alum & mag hydroxide-simeth, dicyclomine, loperamide, meclizine, ondansetron **OR** ondansetron (ZOFRAN) IV, ondansetron, polyethylene glycol, sodium chloride flush   Vital Signs    Vitals:   02/03/19 2356 02/04/19 0500 02/04/19 0503 02/04/19 0811  BP: 106/71  107/72   Pulse: 92  (!) 105   Resp:   16   Temp:   97.6 F (36.4 C)   TempSrc:   Oral   SpO2:   97% 96%  Weight:  55.1 kg    Height:        Intake/Output Summary (Last 24 hours) at 02/04/2019 1000 Last data filed at 02/03/2019 1300 Gross per 24 hour  Intake 240 ml  Output -  Net 240 ml   Filed Weights   02/02/19 0940 02/03/19 0500 02/04/19 0500  Weight: 52.2 kg 54.9 kg 55.1 kg    Telemetry    Atrial fibrillation, heart rate in the 80s to 90s.  Personally reviewed.  Physical Exam   GEN:  Frail-appearing elderly woman, no acute distress.   Neck: No JVD. Cardiac:  Irregularly irregular, no murmur or gallop.  Respiratory: Nonlabored. Clear to auscultation bilaterally. GI: Soft, nontender, bowel sounds present. MS: No edema; No deformity. Neuro:  Nonfocal. Psych: Alert and  oriented x 3. Normal affect.  Labs    Chemistry Recent Labs  Lab 01/30/19 0457 01/31/19 0440 02/02/19 0407 02/03/19 0441  NA 136 136 138 138  K 4.2 4.4 4.3 4.5  CL 103 103 109 108  CO2 22 24 20* 20*  GLUCOSE 100* 108* 104* 99  BUN 27* 28* 32* 33*  CREATININE 1.29* 1.29* 1.21* 1.22*  CALCIUM 8.6* 8.8* 8.8* 9.1  ALBUMIN 2.8*  --  2.7* 2.9*  GFRNONAA 37* 37* 40* 40*  GFRAA 43* 43* 46* 46*  ANIONGAP 11 9 9 10      Hematology Recent Labs  Lab 01/30/19 0457 02/02/19 0407 02/03/19 0441  WBC 6.4 6.0 6.4  RBC 3.23* 3.37* 3.20*  HGB 9.2* 9.7* 9.1*  HCT 29.6* 31.0* 29.8*  MCV 91.6 92.0 93.1  MCH 28.5 28.8 28.4  MCHC 31.1 31.3 30.5  RDW 13.0 13.2 13.2  PLT 287 375 371    Radiology    No results found.  Cardiac Studies   Echocardiogram 01/28/2019: 1. Left ventricular ejection fraction, by visual estimation, is 60 to 65%. The left ventricle has normal function. There is no left ventricular hypertrophy. 2. Elevated left atrial pressure. 3. Left ventricular diastolic parameters are consistent with Grade II diastolic dysfunction (pseudonormalization). 4. Global right ventricle  has normal systolic function.The right ventricular size is normal. No increase in right ventricular wall thickness. 5. Left atrial size was severely dilated. 6. Right atrial size was mildly dilated. 7. The mitral valve is abnormal. Moderate mitral valve regurgitation. No evidence of mitral stenosis. 8. The MR vena contracta is 0.6 cm. The MV/AV TVI ratio is 1.1. Findings support moderate mitral regurgitation. 9. The tricuspid valve is normal in structure. Tricuspid valve regurgitation is mild. 10. The aortic valve is tricuspid. Aortic valve regurgitation is not visualized. No evidence of aortic valve sclerosis or stenosis. 11. The pulmonic valve was not well visualized. Pulmonic valve regurgitation is mild. 12. Moderately elevated pulmonary artery systolic pressure. 13. The inferior vena cava  is normal in size with greater than 50% respiratory variability, suggesting right atrial pressure of 3 mmHg.  Patient Profile     85 y.o. female with a history of hypertension, GI bleed with prior duodenal ulcer, vaginal versus anorectal cancer, esophageal stricture, chronic anemia, and chronic diastolic heart failure being followed for management of atrial fibrillation.  Assessment & Plan    1.  Coarse atrial fibrillation versus atypical flutter with RVR.  Heart rate has come down reasonably well on current combination of therapy including Cardizem, Lopressor, and recently added low-dose amiodarone load.  As noted previously, she is not an optimal candidate for anticoagulation.  With severe left atrial enlargement and moderate mitral regurgitation, plan is to manage with heart rate control strategy as tolerated.  2.  Acute on chronic diastolic heart failure.  Currently stable following diuresis and on low-dose Lasix.  3.  Essential hypertension, blood pressure stable, low range with recent systolics around [id number].  Reviewed with hospitalist service.  Plan to consolidate diltiazem to Cardizem CD 120 mg daily (this will begin tomorrow morning), continue Lopressor at 25 mg twice daily, and continue amiodarone 200 mg twice daily for the next 7 days with decrease to once daily thereafter.  We will arrange follow-up visit with Dr. Bronson Ing or APP in the next few weeks. We will sign off.  Signed, Rozann Lesches, MD  02/04/2019, 10:00 AM

## 2019-02-05 LAB — BASIC METABOLIC PANEL
Anion gap: 6 (ref 5–15)
BUN: 31 mg/dL — ABNORMAL HIGH (ref 8–23)
CO2: 21 mmol/L — ABNORMAL LOW (ref 22–32)
Calcium: 9.2 mg/dL (ref 8.9–10.3)
Chloride: 108 mmol/L (ref 98–111)
Creatinine, Ser: 1.34 mg/dL — ABNORMAL HIGH (ref 0.44–1.00)
GFR calc Af Amer: 41 mL/min — ABNORMAL LOW (ref >60)
GFR calc non Af Amer: 35 mL/min — ABNORMAL LOW (ref >60)
Glucose, Bld: 93 mg/dL (ref 70–99)
Potassium: 5.4 mmol/L — ABNORMAL HIGH (ref 3.5–5.1)
Sodium: 135 mmol/L (ref 135–145)

## 2019-02-05 LAB — CBC
HCT: 30.9 % — ABNORMAL LOW (ref 36.0–46.0)
Hemoglobin: 9.3 g/dL — ABNORMAL LOW (ref 12.0–15.0)
MCH: 28.1 pg (ref 26.0–34.0)
MCHC: 30.1 g/dL (ref 30.0–36.0)
MCV: 93.4 fL (ref 80.0–100.0)
Platelets: 416 10*3/uL — ABNORMAL HIGH (ref 150–400)
RBC: 3.31 MIL/uL — ABNORMAL LOW (ref 3.87–5.11)
RDW: 13.2 % (ref 11.5–15.5)
WBC: 6.1 10*3/uL (ref 4.0–10.5)
nRBC: 0 % (ref 0.0–0.2)

## 2019-02-05 NOTE — Care Management Important Message (Signed)
Important Message  Patient Details  Name: NEKISHA WHITEN MRN: [id number] Date of Birth: [birth year]   Medicare Important Message Given:  Yes     [name] 02/05/2019, 3:21 PM

## 2019-02-05 NOTE — Progress Notes (Signed)
Patient Demographics:    Renee Blake, is a 85 y.o. female, DOB - 07-31-1928, [id number]  Admit date - 01/27/2019   Admitting Physician Malai Lady Denton Brick, MD  Outpatient Primary MD for the patient is [name], MD  LOS - 9  Chief Complaint  Patient presents with  . Chest Pain        Subjective:    [name] today has no fevers, no emesis,  No chest pain,   - -Dyspnea on exertion persist - tachycardia with activity persist -Potassium 5.4  Assessment  & Plan :    Principal Problem:   Acute exacerbation of CHF (congestive heart failure) (HCC) Active Problems:   New onset a-fib with RVR   Iron deficiency anemia due to chronic blood loss---H/o Gi Bleed   H/o Prior Lower GI bleed (anorectal Ca)   H/o Prior duodenal ulcer with hemorrhage   Hypertension  Brief Summary:- 85 y.o. 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 fib with RVR  A/p  1)New Onset Afib--- with heart rate up to the 170s on admission -Patient converted to [place] on 01/28/2019, went back to A. fib with RVR 01/29/19 -Off Cardizem drip -Rate control remains challenging, -Per cardiology start amiodarone 200 twice daily on 02/03/2019 -c/n Cardizem CD 120 mg daily on 02/05/2019, Lopressor 25 mg twice daily on 02/05/2019 and continue amiodarone 200 mg twice daily for 7 days and then decrease to 200 mg daily after that--outpatient follow-up with Dr. Bronson Ing advised -Echo with preserved EF of 50 to 65% with grade 2 diastolic dysfunction CHF and severely dilated left atrium--so patient is unlikely to stay in sinus rhythm if she converts -TSH-1.74, magnesium wnl, potassium wnl -Cardiology consult appreciated -As per Dr.  Jenkins Rouge--- aspirin probably not beneficial, CHA2DS2- VASc score   is =  5 (age (2), HTN, CHF, Female)  Which is  equal to = 7.2 % annual risk of stroke -However patient HAS Bled score is elevated due to Age AND history of prior Major bleeding duodenal ulcer and prior lower GI bleed from an a rectal carcinoma with chronic iron deficiency anemia- secondary to presumed chronic ongoing GI blood loss - she is not an ideal candidate for FULL Anticoagulation -Inability to anticoagulate precludes cardioversion  --Dyspnea on exertion persist - tachycardia with activity persist -Potassium 5.4  2)HFpEF--patient with history of prior chronic diastolic dysfunction CHF -Echo with EF of 60 to 65% with grade 2 diastolic dysfunction  - acute on chronic diastolic dysfunction CHF exacerbation due to A. fib with RVR -Dyspnea on exertion persist  c/n  Lasix to 20 mg daily due to soft BP -Monitor daily weights and fluid input and output -Low-salt diet -- 3)Chronic iron deficiency anemia--Hgb is 9.3 which is close to patient's baseline -No evidence of significant ongoing bleeding at this time continue to monitor and transfuse as clinically indicated -history of prior Major bleeding duodenal ulcer and prior lower GI bleed from an a rectal carcinoma with chronic iron deficiency anemia- Presumed secondary to chronic ongoing GI blood loss  4)HTN--blood pressure somewhat  soft due to A. fib with RVR, discontinued amlodipine,  -Stopped losartan as creatinine is trending up and hyperkalemia and to allow room for rate control medications- -continue p.o. Cardizem and p.o. metoprolol as above in #1  5)H/o Anorectal Carcinoma--- S/p prior Radiation-- recent PET scan Noted.  Patient gets her radiation oncology, GYN and medical oncology care from [facility] through [place] of care and advanced directives as well as CODE STATUS discussed with patient and patient's daughters x 2  -Patient is a full  code  7)AKI----acute kidney injury with hyperkalemia-   potassium up to 5.4  creatinine on admission= 1.0 ,   baseline creatinine = 0.8   , creatinine is now= 1.34 , renally adjust medications, avoid nephrotoxic agents/dehydration/hypotension -Stopped losartan  8)Acute hypoxic respiratory failure--- secondary to dCHF flareup in the setting of A. fib with RVR--- improving with diuresis and rate control,  -Repeat chest x-ray from 11//2020 with improvement -Hypoxia mostly resolved - 9)Generalized weakness/deconditioning--- PT eval appreciated, recommends home health PT -Rolling walker and 3 N 1 commode, as well as shower chair  Disposition/Need for in-Hospital Stay- patient unable to be discharged at this time due to --CHF exacerbation due to A. fib with RVR requiring further titration of medications in order to achieve rate control and diuresis with close monitoring of electrolytes and renal function -Possible discharge home on 02/06/2019 if rate control improved especially with activity  Code Status : Full code  Family Communication:   (patient is alert, awake and coherent) -Discussed with daughterVelva Harman and daughterMarlowe Kays  Disposition Plan  : -Home with home health  consults  :  Cardiology DVT Prophylaxis  :   - Heparin - SCDs   Lab Results  Component Value Date   PLT 416 (H) 02/05/2019    Inpatient Medications  Scheduled Meds: . amiodarone  200 mg Oral BID  . Chlorhexidine Gluconate Cloth  6 each Topical Daily  . diltiazem  120 mg Oral Daily  . furosemide  20 mg Oral Daily  . heparin  5,000 Units Subcutaneous Q8H  . loratadine  10 mg Oral QPM  . mouth rinse  15 mL Mouth Rinse BID  . metoprolol tartrate  25 mg Oral BID  . pantoprazole  40 mg Oral Daily  . sodium chloride flush  3 mL Intravenous Q12H  . traZODone  50 mg Oral QHS   Continuous Infusions: . sodium chloride     PRN Meds:.sodium chloride, acetaminophen **OR** acetaminophen, albuterol, ALPRAZolam, alum & mag  hydroxide-simeth, dicyclomine, loperamide, meclizine, ondansetron **OR** ondansetron (ZOFRAN) IV, ondansetron, polyethylene glycol, sodium chloride flush   Anti-infectives (From admission, onward)   None       Objective:   Vitals:   02/05/19 0139 02/05/19 0500 02/05/19 0546 02/05/19 0742  BP: 105/61  103/63   Pulse: 79  (!) 108   Resp:   18   Temp:   98.1 F (36.7 C)   TempSrc:   Oral   SpO2:   98% 95%  Weight:  53.1 kg    Height:        Wt Readings from Last 3 Encounters:  02/05/19 53.1 kg  07/25/17 52.6 kg  07/22/17 51.7 kg    Intake/Output Summary (Last 24 hours) at 02/05/2019 1830 Last data filed at 02/05/2019 1800 Gross per 24 hour  Intake 240 ml  Output 300 ml  Net -60 ml   Physical Exam Gen:- Awake Alert, in no apparent distress, No conversational dyspnea at rest (has dyspnea on  exertion) HEENT:- [place].AT, No sclera icterus Neck-Supple Neck, +ve JVD,.  Lungs-improving air movement, no wheezing  CV- S1, S2 normal, irregularly irregular,-tachycardia with activity abd-  +ve B.Sounds, Abd Soft, No tenderness,    Extremity/Skin:- No  edema, pedal pulses present  Psych-affect is appropriate, oriented x3 Neuro-generalized weakness, no new focal deficits, no tremors   Data Review:   Micro Results Recent Results (from the past 240 hour(s))  SARS CORONAVIRUS 2 (TAT 6-24 HRS) Nasopharyngeal Nasopharyngeal Swab     Status: None   Collection Time: 01/27/19 11:13 AM   Specimen: Nasopharyngeal Swab  Result Value Ref Range Status   SARS Coronavirus 2 NEGATIVE NEGATIVE Final    Comment: (NOTE) SARS-CoV-2 target nucleic acids are NOT DETECTED. The SARS-CoV-2 RNA is generally detectable in upper and lower respiratory specimens during the acute phase of infection. Negative results do not preclude SARS-CoV-2 infection, do not rule out co-infections with other pathogens, and should not be used as the sole basis for treatment or other patient management decisions. Negative  results must be combined with clinical observations, patient history, and epidemiological information. The expected result is Negative. Fact Sheet for Patients: SugarRoll.be Fact Sheet for Healthcare Providers: https://www.woods-mathews.com/ This test is not yet approved or cleared by the [place] FDA and  has been authorized for detection and/or diagnosis of SARS-CoV-2 by FDA under an Emergency Use Authorization (EUA). This EUA will remain  in effect (meaning this test can be used) for the duration of the COVID-19 declaration under Section 56 4(b)(1) of the Act, 21 U.S.C. section 360bbb-3(b)(1), unless the authorization is terminated or revoked sooner. Performed at [facility], [place] [address], [place], [place] [address]   MRSA PCR Screening     Status: None   Collection Time: 01/27/19  6:33 PM   Specimen: Nasopharyngeal  Result Value Ref Range Status   MRSA by PCR NEGATIVE NEGATIVE Final    Comment:        The GeneXpert MRSA Assay (FDA approved for NASAL specimens only), is one component of a comprehensive MRSA colonization surveillance program. It is not intended to diagnose MRSA infection nor to guide or monitor treatment for MRSA infections. Performed at [facility], [address], [place], [place] [address]     Radiology Reports Dg Chest [place] 1 View  Result Date: 01/31/2019 CLINICAL DATA:  Congestive heart failure. EXAM: PORTABLE CHEST 1 VIEW COMPARISON:  Chest x-rays dated 01/27/2019 and 10/27/2014. FINDINGS: Decreased interstitial edema bilaterally suggesting improved fluid status. Coarse lung markings remain bilaterally indicating some degree of underlying chronic interstitial lung disease and/or chronic bronchitic change, with associated lung hyperexpansion. No confluent opacity to suggest a pneumonia. No pleural effusion or pneumothorax is seen. Heart size is upper normal. Overall cardiomediastinal silhouette is  stable. Osseous structures about the chest are unremarkable. IMPRESSION: 1. Decreased interstitial edema bilaterally suggesting improved fluid status. 2. Hyperexpanded lungs indicating COPD. Probable associated chronic interstitial lung disease and/or chronic bronchitic change. Electronically Signed   By: Franki Cabot M.D.   On: 01/31/2019 08:52   Dg Chest Portable 1 View  Result Date: 01/27/2019 CLINICAL DATA:  Nausea and chest pain today.  Atrial fibrillation. EXAM: PORTABLE CHEST 1 VIEW COMPARISON:  PA and lateral chest 10/27/2014. FINDINGS: There is cardiomegaly and pulmonary edema. Small to moderate pleural effusions are seen, larger on the left. Atherosclerosis noted. No acute or focal bony abnormality. IMPRESSION: Congestive heart failure. Electronically Signed   By: Inge Rise M.D.   On: 01/27/2019 10:54  CBC Recent Labs  Lab 01/30/19 0457 02/02/19 0407 02/03/19 0441 02/05/19 0436  WBC 6.4 6.0 6.4 6.1  HGB 9.2* 9.7* 9.1* 9.3*  HCT 29.6* 31.0* 29.8* 30.9*  PLT 287 375 371 416*  MCV 91.6 92.0 93.1 93.4  MCH 28.5 28.8 28.4 28.1  MCHC 31.1 31.3 30.5 30.1  RDW 13.0 13.2 13.2 13.2   Chemistries  Recent Labs  Lab 01/30/19 0457 01/31/19 0440 02/02/19 0407 02/03/19 0441 02/05/19 0436  NA 136 136 138 138 135  K 4.2 4.4 4.3 4.5 5.4*  CL 103 103 109 108 108  CO2 22 24 20* 20* 21*  GLUCOSE 100* 108* 104* 99 93  BUN 27* 28* 32* 33* 31*  CREATININE 1.29* 1.29* 1.21* 1.22* 1.34*  CALCIUM 8.6* 8.8* 8.8* 9.1 9.2  MG  --   --  2.1  --   --    ------------------------------------------------------------------------------------------------------------------ No results for input(s): CHOL, HDL, LDLCALC, TRIG, CHOLHDL, LDLDIRECT in the last 72 hours.  Lab Results  Component Value Date   HGBA1C 5.9 (H) 08/23/2014   ------------------------------------------------------------------------------------------------------------------ No results for input(s): TSH, T4TOTAL, T3FREE,  THYROIDAB in the last 72 hours.  Invalid input(s): FREET3 ----------------------------------------------------------------------------------------------------------------- No results for input(s): VITAMINB12, FOLATE, FERRITIN, TIBC, IRON, RETICCTPCT in the last 72 hours.  Coagulation profile No results for input(s): INR, PROTIME in the last 168 hours.  No results for input(s): DDIMER in the last 72 hours.  Cardiac Enzymes No results for input(s): CKMB, TROPONINI, MYOGLOBIN in the last 168 hours.  Invalid input(s): CK ------------------------------------------------------------------------------------------------------------------    Component Value Date/Time   BNP 354.0 (H) 01/27/2019 1006   Roxan Hockey M.D on 02/05/2019 at 6:30 PM  Go to [email or web address] - for contact info  [facility] - Office  [phone]

## 2019-02-05 NOTE — Progress Notes (Signed)
Physical Therapy Treatment Patient Details Name: [name] MRN: [id number] DOB: 12/04/1928 Today's Date: 02/05/2019    History of Present Illness Renee Blake  is a 85 y.o. 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 fib with RVR , EMS  treated her with iv Cardizem.  She had transient improvement of her heart rate from about 170 to  120.    PT Comments    Patient demonstrates good return for sitting up at bedside and putting on socks without loss of sitting balance, limited mostly due to c/o fatigue during ambulation in hallway without loss of balance using RW and tolerated sitting up in chair after therapy.  Patient will benefit from continued physical therapy in hospital and recommended venue below to increase strength, balance, endurance for safe ADLs and gait.    Follow Up Recommendations  Home health PT;Supervision for mobility/OOB;Supervision/Assistance - 24 hour     Equipment Recommendations  Rolling walker with 5" wheels;3in1 (PT);Other (comment)    Recommendations for Other Services       Precautions / Restrictions Precautions Precautions: Fall Restrictions Weight Bearing Restrictions: No    Mobility  Bed Mobility Overal bed mobility: Modified Independent             General bed mobility comments: slightly increased time  Transfers Overall transfer level: Needs assistance Equipment used: Rolling walker (2 wheeled) Transfers: Sit to/from [facility] Sit to Stand: Supervision Stand pivot transfers: Supervision       General transfer comment: slightly labored movement, increased time  Ambulation/Gait Ambulation/Gait assistance:  Supervision Gait Distance (Feet): 45 Feet Assistive device: Rolling walker (2 wheeled) Gait Pattern/deviations: Decreased step length - right;Decreased step length - left;Decreased stride length Gait velocity: decreased   General Gait Details: slightly labored cadence without loss of balance, limited mostly due to c/o fatigue   Stairs             Wheelchair Mobility    Modified Rankin (Stroke Patients Only)       Balance Overall balance assessment: Needs assistance Sitting-balance support: Feet supported;No upper extremity supported Sitting balance-Leahy Scale: Good     Standing balance support: During functional activity;Bilateral upper extremity supported Standing balance-Leahy Scale: Fair Standing balance comment: fair/good using RW                            Cognition Arousal/Alertness: Awake/alert Behavior During Therapy: WFL for tasks assessed/performed Overall Cognitive Status: Within Functional Limits for tasks assessed                                        Exercises General Exercises - Lower Extremity Long Arc Quad: Seated;AROM;Strengthening;Both;10 reps Hip Flexion/Marching: Seated;AROM;Strengthening;Both;10 reps Toe Raises: Seated;AROM;Strengthening;10 reps;Both Heel Raises: Seated;AROM;Strengthening;Both;10 reps    General Comments        Pertinent Vitals/Pain Pain Assessment: No/denies pain    Home Living                      Prior Function  PT Goals (current goals can now be found in the care plan section) Acute Rehab PT Goals Patient Stated Goal: Return home with family to assist PT Goal Formulation: With patient/family Time For Goal Achievement: 02/06/19 Potential to Achieve Goals: Good Progress towards PT goals: Progressing toward goals    Frequency    Min 3X/week      PT Plan Current plan remains appropriate    Co-evaluation              AM-PAC PT "6 Clicks"  Mobility   Outcome Measure  Help needed turning from your back to your side while in a flat bed without using bedrails?: None Help needed moving from lying on your back to sitting on the side of a flat bed without using bedrails?: None Help needed moving to and from a bed to a chair (including a wheelchair)?: A Little Help needed standing up from a chair using your arms (e.g., wheelchair or bedside chair)?: None Help needed to walk in hospital room?: A Little Help needed climbing 3-5 steps with a railing? : A Little 6 Click Score: 21    End of Session   Activity Tolerance: Patient tolerated treatment well;Patient limited by fatigue Patient left: in chair;with call bell/phone within reach Nurse Communication: Mobility status PT Visit Diagnosis: Unsteadiness on feet (R26.81);Other abnormalities of gait and mobility (R26.89);Muscle weakness (generalized) (M62.81)     Time: [id number] PT Time Calculation (min) (ACUTE ONLY): 26 min  Charges:  $Therapeutic Exercise: 8-22 mins $Therapeutic Activity: 8-22 mins                     12:20 PM, 02/05/19 Lonell Grandchild, MPT Physical Therapist with [facility] 336 [phone] office [phone] mobile phone

## 2019-02-06 LAB — BASIC METABOLIC PANEL
Anion gap: 11 (ref 5–15)
BUN: 33 mg/dL — ABNORMAL HIGH (ref 8–23)
CO2: 21 mmol/L — ABNORMAL LOW (ref 22–32)
Calcium: 9.5 mg/dL (ref 8.9–10.3)
Chloride: 104 mmol/L (ref 98–111)
Creatinine, Ser: 1.35 mg/dL — ABNORMAL HIGH (ref 0.44–1.00)
GFR calc Af Amer: 41 mL/min — ABNORMAL LOW (ref >60)
GFR calc non Af Amer: 35 mL/min — ABNORMAL LOW (ref >60)
Glucose, Bld: 91 mg/dL (ref 70–99)
Potassium: 4.4 mmol/L (ref 3.5–5.1)
Sodium: 136 mmol/L (ref 135–145)

## 2019-02-06 MED ORDER — ACETAMINOPHEN 325 MG PO TABS: 650.0000 mg | ORAL_TABLET | Freq: Four times a day (QID) | ORAL | 0 refills | Status: DC | PRN

## 2019-02-06 MED ORDER — FUROSEMIDE 20 MG PO TABS: 20.0000 mg | ORAL_TABLET | Freq: Every day | ORAL | 3 refills | Status: DC

## 2019-02-06 MED ORDER — POTASSIUM CITRATE ER 10 MEQ (1080 MG) PO TBCR: 20.0000 meq | EXTENDED_RELEASE_TABLET | ORAL | 0 refills | Status: DC

## 2019-02-06 MED ORDER — AMIODARONE HCL 200 MG PO TABS: 200.0000 mg | ORAL_TABLET | Freq: Two times a day (BID) | ORAL | 1 refills | Status: DC

## 2019-02-06 MED ORDER — ALPRAZOLAM 0.25 MG PO TABS: 0.2500 mg | ORAL_TABLET | Freq: Two times a day (BID) | ORAL | 0 refills | Status: DC | PRN

## 2019-02-06 MED ORDER — DILTIAZEM HCL ER COATED BEADS 180 MG PO CP24: 180.0000 mg | ORAL_CAPSULE | Freq: Every day | ORAL | 3 refills | Status: DC

## 2019-02-06 MED ORDER — PANTOPRAZOLE SODIUM 40 MG PO TBEC: 40.0000 mg | DELAYED_RELEASE_TABLET | Freq: Every day | ORAL | 4 refills | Status: DC

## 2019-02-06 MED ORDER — METOPROLOL SUCCINATE ER 50 MG PO TB24: 50.0000 mg | ORAL_TABLET | Freq: Every day | ORAL | 3 refills | Status: DC

## 2019-02-06 MED ORDER — TRAZODONE HCL 50 MG PO TABS: 50.0000 mg | ORAL_TABLET | Freq: Every day | ORAL | 2 refills | Status: DC

## 2019-02-06 MED ORDER — DILTIAZEM HCL 30 MG PO TABS: 30.0000 mg | ORAL_TABLET | Freq: Two times a day (BID) | ORAL | 1 refills | Status: DC | PRN

## 2019-02-06 NOTE — Discharge Summary (Signed)
Renee Blake, is a 85 y.o. female  DOB Sep 18, 1928  MRN [id number].  Admission date:  01/27/2019  Admitting Physician  Roxan Hockey, MD  Discharge Date:  02/06/2019   Primary MD  [name], MD  Recommendations for primary care physician for things to follow:    1)Very low-salt diet advised 2)Weigh yourself daily, call if you gain more than 3 pounds in 1 day or more than 5 pounds in 1 week as your diuretic medications may need to be adjusted 3)Limit your Fluid  intake to no more than 60 ounces (1.8 Liters) per day 4) take medications as prescribed 5)Avoid ibuprofen/Advil/Aleve/Motrin/Goody Powders/Naproxen/BC powders/Meloxicam/Diclofenac/Indomethacin and other Nonsteroidal anti-inflammatory medications as these will make you more likely to bleed and can cause stomach ulcers, can also cause Kidney problems.  6) you may take additional Cardizem 30 mg up to twice a day as needed if your heart rate goes above 110 beats per minute 7) follow-up with cardiologist Dr. Bronson Ing as advised 8) follow-up to primary care physician within a week for recheck and reevaluation and for repeat CBC and BMP blood test  Admission Diagnosis  Hypoxia [R09.02] Atrial fibrillation, new onset ([place]) [I48.91] Congestive heart failure, unspecified HF chronicity, unspecified heart failure type ([place]) [I50.9] Acute exacerbation of CHF (congestive heart failure) ([place]) [I50.9]   Discharge Diagnosis  Hypoxia [R09.02] Atrial fibrillation, new onset ([place]) [I48.91] Congestive heart failure, unspecified HF chronicity, unspecified heart failure type ([place]) [I50.9] Acute exacerbation of CHF (congestive heart failure) ([place]) [I50.9]    Principal Problem:   Acute exacerbation of CHF (congestive heart failure) ([place]) Active Problems:   New onset a-fib with RVR   Iron deficiency anemia due to chronic blood loss---H/o Gi Bleed   H/o  Prior Lower GI bleed (anorectal Ca)   H/o Prior duodenal ulcer with hemorrhage   Hypertension      Past Medical History:  Diagnosis Date  . Acute blood loss anemia   . Chronic diarrhea   . Dilation of biliary tract    biliary duct per 02/26/13 CT  . Duodenal stricture   . Esophageal stricture   . Gastric ulcer   . GI bleed   . Hyperlipemia   . Hypertension   . Microscopic colitis   . Nephrolithiasis   . Pancreatic duct dilated 02/26/13  . Schatzki's ring   . Skin cancer   . Sliding hiatal hernia   . Vertigo     Past Surgical History:  Procedure Laterality Date  . BIOPSY  07/28/2017   Procedure: BIOPSY;  Surgeon: Rogene Houston, MD;  [facility];  Service: Endoscopy;;  rectum  . COLONOSCOPY N/A 07/28/2017   Procedure: COLONOSCOPY;  Surgeon: Rogene Houston, MD;  [facility];  Service: Endoscopy;  Laterality: N/A;  . ESOPHAGOGASTRODUODENOSCOPY N/A 02/27/2013   Procedure: ESOPHAGOGASTRODUODENOSCOPY (EGD);  Surgeon: Ladene Artist, MD;  Location: [facility] ENDOSCOPY;  Service: Endoscopy;  Laterality: N/A;  . ESOPHAGOGASTRODUODENOSCOPY N/A 03/28/2013   Procedure: ESOPHAGOGASTRODUODENOSCOPY (EGD);  Surgeon: Rogene Houston, MD;  [facility];  Service: Endoscopy;  Laterality: N/A;  . HOT HEMOSTASIS N/A 02/27/2013   Procedure: HOT HEMOSTASIS (ARGON PLASMA COAGULATION/BICAP);  Surgeon: Ladene Artist, MD;  Location: [facility] ENDOSCOPY;  Service: Endoscopy;  Laterality: N/A;  . NOSE SURGERY     for skin cancer  . POLYPECTOMY  07/28/2017   Procedure: POLYPECTOMY;  Surgeon: Rogene Houston, MD;  [facility]: [facility];  Service: Endoscopy;;  colon        HPI  from the history and physical done on the day of admission:    -  Renee Blake  is a 85 y.o. female with history of hypertension, and a rectal carcinoma with spread to the vagina, status post prior treatment, history of upper and lower GI bleed with chronic iron deficiency anemia presumed  secondary to chronic blood loss , presents to the ED by EMS with shortness of breath and chest discomfort as well as tachycardia and found to be in  A. fib with RVR , EMS  treated her with iv Cardizem. She had transient improvement of her heart rate from about 170 to120.  -- In the ED patient remains in A. fib with RVR -She is short of breath, with chest discomfort and palpitations and dizziness -Patient's daughter [name]) at bedside, additional history obtained  -in ED chest x-ray consistent with pulmonary edema -BNP elevated at 354, hemoglobin 9.7, which is close to baseline --TSH-1.74, magnesium 2.0, potassium 3.8 -Creatinine is 1.0 -COVID-19 testing pending RN Gerda Diss at bedside during interview and exam    Hospital Course:   -Brief Summary:- 85 y.o.femalewith history of hypertension, and a rectal carcinoma with spread to the vagina, status post prior treatment, history of upper and lower GI bleed with chronic iron deficiency anemia presumed secondary to chronic blood loss admitted on 01/27/2019 with A. fib with RVR  A/p  1)New Onset Afib---with heart rate up to the 170s on admission -Patient converted to [facility] on 01/28/2019, went back to A. fib with RVR 01/29/19 -Off Cardizem drip -Rate control is improved significantly -Per cardiology started Amiodarone 200 twice daily on 02/03/2019 -Discharge on Cardizem CD 180 mg daily, Toprol-XL 50 mg daily   and continue amiodarone 200 mg twice daily for 7 days and then decrease to 200 mg daily after that--outpatient follow-up with Dr. Bronson Ing advised -May use short acting Cardizem 30 mg every 12 hours as needed tachycardia with heart rate above 120 -Echo with preserved EF of 50 to 65% with grade 2 diastolic dysfunction CHF and severely dilated left atrium--so patient is unlikely to stay in sinus rhythm if she converts -TSH-1.74,magnesium wnl,potassium wnl -Cardiology consult appreciated -As per Dr. Jenkins Rouge--- aspirin  probably not beneficial, CHA2DS2- VASc score is = 5 (age (2), HTN, CHF, Female)Which is equal to = 7.2% annual risk of stroke -However patientHAS Bledscoreis elevated due to Age ANDhistory of prior Majorbleedingduodenal ulcer and prior lower GI bleed from an a rectal carcinoma with chronic iron deficiency anemia-secondary to presumed chronic ongoing GI blood loss - she is not an ideal candidate for FULL Anticoagulation -Inability to anticoagulate precludes cardioversion  -Hyperkalemia is resolved, rate control much better, no dyspnea at rest, no significant dyspnea on exertion, just fatigue and poor endurance  2)HFpEF--patient with history of prior chronic diastolic dysfunction CHF -Echo with EF of 60 to 65% with grade 2 diastolic dysfunction  - acute on chronic diastolic dysfunction CHF exacerbation due to A. fib with RVR -Diuresed well on Lasix, overall improved, discharged home  on Lasix -Low-salt diet -- 3)Chronic iron deficiency anemia--Hgbis 9.3 which is close to patient's baseline -No evidence of significant ongoing bleeding at this time continue to monitor and transfuse as clinically indicated -history of priorMajorbleedingduodenal ulcer and prior lower GI bleed from an a rectal carcinoma with chronic iron deficiency anemia- Presumedsecondary to chronic ongoing GI blood loss  4)HTN--stable, amlodipine and losartan has been discontinued -Discharge home on Toprol-XL and Cardizem as prescribed  5)H/o Anorectal Carcinoma--- S/p prior Radiation-- recent PET scan Noted.Patient gets herradiation oncology, GYN and medical oncology care from [facility] through [place] of care and advanced directives as well as CODE STATUS discussed with patient andpatient's daughters x 2  -Patient is a full code  7)AKI----acute kidney injury with hyperkalemia-   potassium up to 5.4  creatinine on admission= 1.0 ,   baseline creatinine = 0.8   , creatinine is now= 1.35  , renally adjust medications, avoid nephrotoxic agents/dehydration/hypotension -Stopped losartan  8)Acute hypoxic respiratory failure--- secondary to dCHF flareup in the setting of A. fib with RVR--- improved with diuresis and better rate control,  -Repeat chest x-ray from 01/31/2019 with improvement -Hypoxia  resolved - 9)Generalized weakness/deconditioning--- PT eval appreciated, recommends home health PT -Rolling walker and 3 N 1 commode, as well as shower chair  [place] home with home health servicesCode Status : Full code  Family Communication:   (patient is alert, awake and coherent) -Discussed with daughterVelva Harman and daughterMarlowe Kays  Disposition Plan  : -Home with home health  consults  :  Cardiology  Discharge Condition: stable--without hypoxia  Follow UP  Follow-up Information    Erma Heritage, PA-C Follow up on 02/18/2019.   Specialties: Physician Assistant, Cardiology Why: Cardiology Hospital Follow-up on 02/18/2019 at 2:00 PM.  Contact information: [address]  [place] [address] [phone]            Diet and Activity recommendation:  As advised  Discharge Instructions     Discharge Instructions    Call MD for:  difficulty breathing, headache or visual disturbances   Complete by: As directed    Call MD for:  persistant dizziness or light-headedness   Complete by: As directed    Call MD for:  persistant nausea and vomiting   Complete by: As directed    Call MD for:  severe uncontrolled pain   Complete by: As directed    Call MD for:  temperature >100.4   Complete by: As directed    Diet - low sodium heart healthy   Complete by: As directed    Discharge instructions   Complete by: As directed    1)Very low-salt diet advised 2)Weigh yourself daily, call if you gain more than 3 pounds in 1 day or more than 5 pounds in 1 week as your diuretic medications may need to be adjusted 3)Limit your Fluid  intake to no more than 60  ounces (1.8 Liters) per day 4) take medications as prescribed 5)Avoid ibuprofen/Advil/Aleve/Motrin/Goody Powders/Naproxen/BC powders/Meloxicam/Diclofenac/Indomethacin and other Nonsteroidal anti-inflammatory medications as these will make you more likely to bleed and can cause stomach ulcers, can also cause Kidney problems.  6) you may take additional Cardizem 30 mg up to twice a day as needed if your heart rate goes above 120 beats per minute 7) follow-up with cardiologist Dr. Bronson Ing as advised 8) follow-up to primary care physician within a week for recheck and reevaluation and for repeat CBC and BMP blood test   Increase activity slowly   Complete  by: As directed         Discharge Medications     Allergies as of 02/06/2019      Reactions   Clonazepam Other (See Comments)   Pruritis   Codeine Nausea And Vomiting, Other (See Comments)   Reaction unknown   Sulfa Antibiotics Other (See Comments)   Reactions unknown      Medication List    STOP taking these medications   amLODipine 2.5 MG tablet Commonly known as: NORVASC   aspirin EC 81 MG tablet   losartan 50 MG tablet Commonly known as: COZAAR     TAKE these medications   acetaminophen 325 MG tablet Commonly known as: TYLENOL Take 2 tablets (650 mg total) by mouth every 6 (six) hours as needed for mild pain, fever or headache (or Fever >/= 101).   ALPRAZolam 0.25 MG tablet Commonly known as: XANAX Take 1 tablet (0.25 mg total) by mouth 2 (two) times daily as needed. for anxiety   amiodarone 200 MG tablet Commonly known as: PACERONE Take 1 tablet (200 mg total) by mouth 2 (two) times daily. Thru Friday  02/12/2019 , then 200 mg daily starting Saturday 02/13/2019   dicyclomine 10 MG capsule Commonly known as: BENTYL Take 10 mg by mouth every 4 (four) hours as needed for spasms.   diltiazem 180 MG 24 hr capsule Commonly known as: CARDIZEM CD Take 1 capsule (180 mg total) by mouth daily. Start taking on:  February 07, 2019   diltiazem 30 MG tablet Commonly known as: Cardizem Take 1 tablet (30 mg total) by mouth every 12 (twelve) hours as needed. For HR > 120 bpm   furosemide 20 MG tablet Commonly known as: LASIX Take 1 tablet (20 mg total) by mouth daily. Start taking on: February 07, 2019   levocetirizine 5 MG tablet Commonly known as: XYZAL Take 5 mg by mouth every evening.   meclizine 25 MG tablet Commonly known as: ANTIVERT Take 25 mg by mouth daily as needed for dizziness.   metoprolol succinate 50 MG 24 hr tablet Commonly known as: Toprol XL Take 1 tablet (50 mg total) by mouth daily.   pantoprazole 40 MG tablet Commonly known as: PROTONIX Take 1 tablet (40 mg total) by mouth daily. What changed:   when to take this  reasons to take this   potassium citrate 10 MEQ (1080 MG) SR tablet Commonly known as: UROCIT-K Take 2 tablets (20 mEq total) by mouth every Monday, Wednesday, and Friday. Start taking on: February 08, 2019 What changed: when to take this   traZODone 50 MG tablet Commonly known as: DESYREL Take 1 tablet (50 mg total) by mouth at bedtime.   Zofran 4 MG tablet Generic drug: ondansetron Take 4 mg by mouth every 8 (eight) hours as needed for nausea or vomiting.            Durable Medical Equipment  (From admission, onward)         Start     Ordered   02/02/19 1258  For home use only DME Walker rolling  Once    Question Answer Comment  Patient needs a walker to treat with the following condition CHF (congestive heart failure) ([place])   Patient needs a walker to treat with the following condition Dyspnea and respiratory abnormalities      02/02/19 1257          Major procedures and Radiology Reports - PLEASE review detailed and final reports for all details, in brief -  Dg Chest Port 1 View  Result Date: 01/31/2019 CLINICAL DATA:  Congestive heart failure. EXAM: PORTABLE CHEST 1 VIEW COMPARISON:  Chest x-rays dated 01/27/2019 and  10/27/2014. FINDINGS: Decreased interstitial edema bilaterally suggesting improved fluid status. Coarse lung markings remain bilaterally indicating some degree of underlying chronic interstitial lung disease and/or chronic bronchitic change, with associated lung hyperexpansion. No confluent opacity to suggest a pneumonia. No pleural effusion or pneumothorax is seen. Heart size is upper normal. Overall cardiomediastinal silhouette is stable. Osseous structures about the chest are unremarkable. IMPRESSION: 1. Decreased interstitial edema bilaterally suggesting improved fluid status. 2. Hyperexpanded lungs indicating COPD. Probable associated chronic interstitial lung disease and/or chronic bronchitic change. Electronically Signed   By: Franki Cabot M.D.   On: 01/31/2019 08:52   Dg Chest Portable 1 View  Result Date: 01/27/2019 CLINICAL DATA:  Nausea and chest pain today.  Atrial fibrillation. EXAM: PORTABLE CHEST 1 VIEW COMPARISON:  PA and lateral chest 10/27/2014. FINDINGS: There is cardiomegaly and pulmonary edema. Small to moderate pleural effusions are seen, larger on the left. Atherosclerosis noted. No acute or focal bony abnormality. IMPRESSION: Congestive heart failure. Electronically Signed   By: Inge Rise M.D.   On: 01/27/2019 10:54    Micro Results   Recent Results (from the past 240 hour(s))  MRSA PCR Screening     Status: None   Collection Time: 01/27/19  6:33 PM   Specimen: Nasopharyngeal  Result Value Ref Range Status   MRSA by PCR NEGATIVE NEGATIVE Final    Comment:        The GeneXpert MRSA Assay (FDA approved for NASAL specimens only), is one component of a comprehensive MRSA colonization surveillance program. It is not intended to diagnose MRSA infection nor to guide or monitor treatment for MRSA infections. Performed at [facility], [address] [place], [place] [address]     Today   Subjective    Nandhini Shwartz today has no new complaints -Resting  comfortably -Ambulated without significant dyspnea on exertion no tachycardia or palpitations or dizziness -Patient's daughter Marlowe Kays at bedside, questions answered          Patient has been seen and examined prior to discharge   Objective   Blood pressure (!) 143/81, pulse 81, temperature 97.8 F (36.6 C), resp. rate 18, height 5\' 6"  (1.676 m), weight 52.5 kg, SpO2 100 %.   Intake/Output Summary (Last 24 hours) at 02/06/2019 1313 Last data filed at 02/05/2019 2100 Gross per 24 hour  Intake 240 ml  Output 400 ml  Net -160 ml    Exam Gen:- Awake Alert, no acute distress  HEENT:- [place].AT, No sclera icterus Neck-Supple Neck,No JVD,.  Lungs-much improved air movement, no wheezing or rales CV- S1, S2 normal, irregular, heart rate in the 70s Abd-  +ve B.Sounds, Abd Soft, No tenderness,    Extremity/Skin:- No  edema,   good pulses Psych-affect is appropriate, oriented x3 Neuro-generalized weakness, no new focal deficits, no tremors    Data Review   CBC w Diff:  Lab Results  Component Value Date   WBC 6.1 02/05/2019   HGB 9.3 (L) 02/05/2019   HCT 30.9 (L) 02/05/2019   PLT 416 (H) 02/05/2019   LYMPHOPCT 7 01/27/2019   MONOPCT 10 01/27/2019   EOSPCT 2 01/27/2019   BASOPCT 0 01/27/2019    CMP:  Lab Results  Component Value Date   NA 136 02/06/2019   K 4.4 02/06/2019   CL 104 02/06/2019   CO2 21 (L) 02/06/2019  BUN 33 (H) 02/06/2019   CREATININE 1.35 (H) 02/06/2019   PROT 7.1 01/27/2019   ALBUMIN 2.9 (L) 02/03/2019   BILITOT 0.7 01/27/2019   ALKPHOS 81 01/27/2019   AST 12 (L) 01/27/2019   ALT 11 01/27/2019  .  Total Discharge time is about 33 minutes  Roxan Hockey M.D on 02/06/2019 at 1:13 PM  Go to [email or web address] -  for contact info  [facility] - Office  [phone]

## 2019-02-06 NOTE — TOC Transition Note (Signed)
Transition of Care [facility]) - CM/SW Discharge Note   Patient Details  Name: Renee Blake MRN: [id number] Date of Birth: [birth year]  [facility]) CM/SW Contact:  Latanya Maudlin, RN Phone Number: 02/06/2019, 1:40 PM   Clinical Narrative:  Patient to be discharged per MD order. Orders in place for home health services. [facility]. Notified Corene Cornea of discharge. DME rolling walker and bedside commode has already been ordered planning for home delivery. Family to transport.       Final next level of care: [place] Barriers to Discharge: No Barriers Identified   Patient Goals and CMS Choice Patient states their goals for this hospitalization and ongoing recovery are:: To return home with in home help CMS Medicare.gov Compare Post Acute Care list provided to:: Patient Choice offered to / list presented to : Adult Children  Discharge Placement                       Discharge Plan and Services                          HH Arranged: RN, PT [facility] Agency: [place] ([place]) Date [place]: 02/06/19 Time [place]: [id number] Representative spoke with at [place]: [place] ([place]) Interventions     Readmission Risk Interventions Readmission Risk Prevention Plan 02/06/2019  Transportation Screening Complete  PCP or Specialist Appt within 5-7 Days Complete  Home Care Screening Complete  Medication Review (RN CM) Complete  Some recent data might be hidden

## 2019-02-06 NOTE — Plan of Care (Signed)

## 2019-02-06 NOTE — Discharge Instructions (Signed)
Heart Failure---Generalized weakness and deconditioning   1)Very low-salt diet advised 2)Weigh yourself daily, call if you gain more than 3 pounds in 1 day or more than 5 pounds in 1 week as your diuretic medications may need to be adjusted 3)Limit your Fluid  intake to no more than 60 ounces (1.8 Liters) per day 4) take medications as prescribed 5)Avoid ibuprofen/Advil/Aleve/Motrin/Goody Powders/Naproxen/BC powders/Meloxicam/Diclofenac/Indomethacin and other Nonsteroidal anti-inflammatory medications as these will make you more likely to bleed and can cause stomach ulcers, can also cause Kidney problems.  6) you may take additional Cardizem 30 mg up to twice a day as needed if your heart rate goes above 110 beats per minute 7) follow-up with cardiologist Dr. Bronson Ing as advised 8) follow-up to primary care physician within a week for recheck and reevaluation and for repeat CBC and BMP blood test

## 2019-02-08 DIAGNOSIS — K8689 Other specified diseases of pancreas: Secondary | ICD-10-CM | POA: Diagnosis not present

## 2019-02-08 DIAGNOSIS — N200 Calculus of kidney: Secondary | ICD-10-CM | POA: Diagnosis not present

## 2019-02-08 DIAGNOSIS — R531 Weakness: Secondary | ICD-10-CM | POA: Diagnosis not present

## 2019-02-08 DIAGNOSIS — K222 Esophageal obstruction: Secondary | ICD-10-CM | POA: Diagnosis not present

## 2019-02-08 DIAGNOSIS — N179 Acute kidney failure, unspecified: Secondary | ICD-10-CM | POA: Diagnosis not present

## 2019-02-08 DIAGNOSIS — I110 Hypertensive heart disease with heart failure: Secondary | ICD-10-CM | POA: Diagnosis not present

## 2019-02-08 DIAGNOSIS — K315 Obstruction of duodenum: Secondary | ICD-10-CM | POA: Diagnosis not present

## 2019-02-08 DIAGNOSIS — I5033 Acute on chronic diastolic (congestive) heart failure: Secondary | ICD-10-CM | POA: Diagnosis not present

## 2019-02-08 DIAGNOSIS — E785 Hyperlipidemia, unspecified: Secondary | ICD-10-CM | POA: Diagnosis not present

## 2019-02-08 DIAGNOSIS — D509 Iron deficiency anemia, unspecified: Secondary | ICD-10-CM | POA: Diagnosis not present

## 2019-02-08 DIAGNOSIS — J9601 Acute respiratory failure with hypoxia: Secondary | ICD-10-CM | POA: Diagnosis not present

## 2019-02-08 DIAGNOSIS — R197 Diarrhea, unspecified: Secondary | ICD-10-CM | POA: Diagnosis not present

## 2019-02-08 DIAGNOSIS — I4891 Unspecified atrial fibrillation: Secondary | ICD-10-CM | POA: Diagnosis not present

## 2019-02-09 ENCOUNTER — Other Ambulatory Visit: Payer: Self-pay

## 2019-02-09 NOTE — Patient Outreach (Signed)
Red Emmi: New referral Alert: New prescription and general questions.  Placed call to patient and explained reason for call. Patient reports she does have new prescriptions. States she has gotten her RX filled and states that she is taking her medication as prescribed. Reports home health RN came. Denies any current questions or concerns.  PLAN:  Patient denies needs. Case closed  Tomasa Rand, RN, BSN, CEN [facility] [employer] [phone]

## 2019-02-12 DIAGNOSIS — R531 Weakness: Secondary | ICD-10-CM | POA: Diagnosis not present

## 2019-02-12 DIAGNOSIS — D509 Iron deficiency anemia, unspecified: Secondary | ICD-10-CM | POA: Diagnosis not present

## 2019-02-12 DIAGNOSIS — I4891 Unspecified atrial fibrillation: Secondary | ICD-10-CM | POA: Diagnosis not present

## 2019-02-12 DIAGNOSIS — K315 Obstruction of duodenum: Secondary | ICD-10-CM | POA: Diagnosis not present

## 2019-02-12 DIAGNOSIS — E785 Hyperlipidemia, unspecified: Secondary | ICD-10-CM | POA: Diagnosis not present

## 2019-02-12 DIAGNOSIS — R197 Diarrhea, unspecified: Secondary | ICD-10-CM | POA: Diagnosis not present

## 2019-02-12 DIAGNOSIS — K222 Esophageal obstruction: Secondary | ICD-10-CM | POA: Diagnosis not present

## 2019-02-12 DIAGNOSIS — N200 Calculus of kidney: Secondary | ICD-10-CM | POA: Diagnosis not present

## 2019-02-12 DIAGNOSIS — I5033 Acute on chronic diastolic (congestive) heart failure: Secondary | ICD-10-CM | POA: Diagnosis not present

## 2019-02-12 DIAGNOSIS — I110 Hypertensive heart disease with heart failure: Secondary | ICD-10-CM | POA: Diagnosis not present

## 2019-02-12 DIAGNOSIS — K8689 Other specified diseases of pancreas: Secondary | ICD-10-CM | POA: Diagnosis not present

## 2019-02-12 DIAGNOSIS — J9601 Acute respiratory failure with hypoxia: Secondary | ICD-10-CM | POA: Diagnosis not present

## 2019-02-12 DIAGNOSIS — N179 Acute kidney failure, unspecified: Secondary | ICD-10-CM | POA: Diagnosis not present

## 2019-02-15 DIAGNOSIS — I5031 Acute diastolic (congestive) heart failure: Secondary | ICD-10-CM | POA: Diagnosis not present

## 2019-02-15 DIAGNOSIS — K922 Gastrointestinal hemorrhage, unspecified: Secondary | ICD-10-CM | POA: Diagnosis not present

## 2019-02-15 DIAGNOSIS — I4891 Unspecified atrial fibrillation: Secondary | ICD-10-CM | POA: Diagnosis not present

## 2019-02-15 DIAGNOSIS — D508 Other iron deficiency anemias: Secondary | ICD-10-CM | POA: Diagnosis not present

## 2019-02-15 DIAGNOSIS — R519 Headache, unspecified: Secondary | ICD-10-CM | POA: Diagnosis not present

## 2019-02-15 DIAGNOSIS — R944 Abnormal results of kidney function studies: Secondary | ICD-10-CM | POA: Diagnosis not present

## 2019-02-15 DIAGNOSIS — K219 Gastro-esophageal reflux disease without esophagitis: Secondary | ICD-10-CM | POA: Diagnosis not present

## 2019-02-15 DIAGNOSIS — I10 Essential (primary) hypertension: Secondary | ICD-10-CM | POA: Diagnosis not present

## 2019-02-16 DIAGNOSIS — N179 Acute kidney failure, unspecified: Secondary | ICD-10-CM | POA: Diagnosis not present

## 2019-02-16 DIAGNOSIS — N200 Calculus of kidney: Secondary | ICD-10-CM | POA: Diagnosis not present

## 2019-02-16 DIAGNOSIS — R531 Weakness: Secondary | ICD-10-CM | POA: Diagnosis not present

## 2019-02-16 DIAGNOSIS — K8689 Other specified diseases of pancreas: Secondary | ICD-10-CM | POA: Diagnosis not present

## 2019-02-16 DIAGNOSIS — K222 Esophageal obstruction: Secondary | ICD-10-CM | POA: Diagnosis not present

## 2019-02-16 DIAGNOSIS — I5033 Acute on chronic diastolic (congestive) heart failure: Secondary | ICD-10-CM | POA: Diagnosis not present

## 2019-02-16 DIAGNOSIS — K315 Obstruction of duodenum: Secondary | ICD-10-CM | POA: Diagnosis not present

## 2019-02-16 DIAGNOSIS — I110 Hypertensive heart disease with heart failure: Secondary | ICD-10-CM | POA: Diagnosis not present

## 2019-02-16 DIAGNOSIS — R197 Diarrhea, unspecified: Secondary | ICD-10-CM | POA: Diagnosis not present

## 2019-02-16 DIAGNOSIS — J9601 Acute respiratory failure with hypoxia: Secondary | ICD-10-CM | POA: Diagnosis not present

## 2019-02-16 DIAGNOSIS — E785 Hyperlipidemia, unspecified: Secondary | ICD-10-CM | POA: Diagnosis not present

## 2019-02-16 DIAGNOSIS — I4891 Unspecified atrial fibrillation: Secondary | ICD-10-CM | POA: Diagnosis not present

## 2019-02-16 DIAGNOSIS — D509 Iron deficiency anemia, unspecified: Secondary | ICD-10-CM | POA: Diagnosis not present

## 2019-02-18 ENCOUNTER — Ambulatory Visit (INDEPENDENT_AMBULATORY_CARE_PROVIDER_SITE_OTHER): Payer: Medicare Other | Admitting: Student

## 2019-02-18 ENCOUNTER — Other Ambulatory Visit: Payer: Self-pay

## 2019-02-18 ENCOUNTER — Encounter: Payer: Self-pay | Admitting: Student

## 2019-02-18 VITALS — BP 111/65 | HR 82 | Temp 95.1°F | Ht 66.0 in | Wt 114.0 lb

## 2019-02-18 DIAGNOSIS — I10 Essential (primary) hypertension: Secondary | ICD-10-CM

## 2019-02-18 DIAGNOSIS — I5032 Chronic diastolic (congestive) heart failure: Secondary | ICD-10-CM | POA: Diagnosis not present

## 2019-02-18 DIAGNOSIS — N183 Chronic kidney disease, stage 3 unspecified: Secondary | ICD-10-CM

## 2019-02-18 DIAGNOSIS — I4819 Other persistent atrial fibrillation: Secondary | ICD-10-CM | POA: Diagnosis not present

## 2019-02-18 MED ORDER — AMIODARONE HCL 200 MG PO TABS: 200.0000 mg | ORAL_TABLET | Freq: Every day | ORAL | 3 refills | Status: DC

## 2019-02-18 MED ORDER — DILTIAZEM HCL ER COATED BEADS 180 MG PO CP24: 180.0000 mg | ORAL_CAPSULE | Freq: Every day | ORAL | 3 refills | Status: DC

## 2019-02-18 MED ORDER — METOPROLOL SUCCINATE ER 50 MG PO TB24: 50.0000 mg | ORAL_TABLET | Freq: Every day | ORAL | 3 refills | Status: DC

## 2019-02-18 NOTE — Progress Notes (Signed)
Cardiology Office Note    Date:  02/18/2019   ID:  Renee Blake, DOB May 02, 1928, MRN [id number]  PCP:  [name], MD  Cardiologist: Kate Sable, MD    Chief Complaint  Patient presents with  . Hospitalization Follow-up    History of Present Illness:    Renee Blake is a 85 y.o. female with past medical history of chronic diastolic CHF, HTN, GIB, chronic anemia, and anorectal cancer (s/p radiation treatment) who presents to the office today for hospital follow-up.   She most recently presented to [facility] ED on 01/27/2019 for evaluation of nausea and chest discomfort. Was found to be in atrial fibrillation with RVR and was started on IV Cardizem at the time of admission. BNP was also elevated to 354 and she was treated for a CHF exacerbation. TSH and electrolytes were within normal limits. Echocardiogram showed a preserved EF of 60 to 65% with no regional wall motion abnormalities. She did have moderate mitral regurgitation. Was not felt to be a candidate for anticoagulation given her history of GI bleed, chronic anemia and cancer diagnosis.Rate control strategy was pursued and she was transitioned to Cardizem CD 180 mg daily and Toprol-XL 50mg  daily at the time of discharge. She was started on Amiodarone for rate control given her soft BP and was discharged on 200 mg twice daily for 7 days with instructions to decrease to once daily afterwards.   In talking with the patient and her daughter today, she reports overall doing well since her recent hospitalization. She denies any recurrent chest pain or palpitations. She does have dyspnea on exertion but denies any acute change in this. No recent orthopnea, PND or lower extremity edema. Weight has overall been stable on her home scales.  She does report having continued weakness following hospital discharge and Home Health PT has been arranged and was initiated this week. She has been using a walker around the house due to  weakness along her lower extremities.    Past Medical History:  Diagnosis Date  . Acute blood loss anemia   . Atrial fibrillation ([place])    a. diagnosed in 12/2018. Rate-control pursued given not a candidate for anticoagulation  . Chronic diarrhea   . Dilation of biliary tract    biliary duct per 02/26/13 CT  . Duodenal stricture   . Esophageal stricture   . Gastric ulcer   . GI bleed   . Hyperlipemia   . Hypertension   . Microscopic colitis   . Nephrolithiasis   . Pancreatic duct dilated 02/26/13  . Schatzki's ring   . Skin cancer   . Sliding hiatal hernia   . Vertigo     Past Surgical History:  Procedure Laterality Date  . BIOPSY  07/28/2017   Procedure: BIOPSY;  Surgeon: Rogene Houston, MD;  Location: [facility];  Service: Endoscopy;;  rectum  . COLONOSCOPY N/A 07/28/2017   Procedure: COLONOSCOPY;  Surgeon: Rogene Houston, MD;  Location: [facility];  Service: Endoscopy;  Laterality: N/A;  . ESOPHAGOGASTRODUODENOSCOPY N/A 02/27/2013   Procedure: ESOPHAGOGASTRODUODENOSCOPY (EGD);  Surgeon: Ladene Artist, MD;  Location: [facility] ENDOSCOPY;  Service: Endoscopy;  Laterality: N/A;  . ESOPHAGOGASTRODUODENOSCOPY N/A 03/28/2013   Procedure: ESOPHAGOGASTRODUODENOSCOPY (EGD);  Surgeon: Rogene Houston, MD;  Location: [facility];  Service: Endoscopy;  Laterality: N/A;  . HOT HEMOSTASIS N/A 02/27/2013   Procedure: HOT HEMOSTASIS (ARGON PLASMA COAGULATION/BICAP);  Surgeon: Ladene Artist, MD;  Location: [facility] ENDOSCOPY;  Service: Endoscopy;  Laterality: N/A;  . NOSE SURGERY     for skin cancer  . POLYPECTOMY  07/28/2017   Procedure: POLYPECTOMY;  Surgeon: Rogene Houston, MD;  Location: [facility];  Service: Endoscopy;;  colon     Current Medications: Outpatient Medications Prior to Visit  Medication Sig Dispense Refill  . acetaminophen (TYLENOL) 325 MG tablet Take 2 tablets (650 mg total) by mouth every 6 (six) hours as needed for mild pain, fever or headache (or Fever  >/= 101). 12 tablet 0  . ALPRAZolam (XANAX) 0.25 MG tablet Take 1 tablet (0.25 mg total) by mouth 2 (two) times daily as needed. for anxiety 15 tablet 0  . dicyclomine (BENTYL) 10 MG capsule Take 10 mg by mouth every 4 (four) hours as needed for spasms.    Marland Kitchen diltiazem (CARDIZEM) 30 MG tablet Take 1 tablet (30 mg total) by mouth every 12 (twelve) hours as needed. For HR > 120 bpm 15 tablet 1  . furosemide (LASIX) 20 MG tablet Take 1 tablet (20 mg total) by mouth daily. 30 tablet 3  . levocetirizine (XYZAL) 5 MG tablet Take 5 mg by mouth every evening.    . meclizine (ANTIVERT) 25 MG tablet Take 25 mg by mouth daily as needed for dizziness.     . ondansetron (ZOFRAN) 4 MG tablet Take 4 mg by mouth every 8 (eight) hours as needed for nausea or vomiting.    . pantoprazole (PROTONIX) 40 MG tablet Take 1 tablet (40 mg total) by mouth daily. 30 tablet 4  . amiodarone (PACERONE) 200 MG tablet Take 1 tablet (200 mg total) by mouth 2 (two) times daily. Thru Friday  02/12/2019 , then 200 mg daily starting Saturday 02/13/2019 60 tablet 1  . diltiazem (CARDIZEM CD) 180 MG 24 hr capsule Take 1 capsule (180 mg total) by mouth daily. 30 capsule 3  . metoprolol succinate (TOPROL-XL) 50 MG 24 hr tablet Take 1 tablet (50 mg total) by mouth daily. 30 tablet 3  . potassium citrate (UROCIT-K) 10 MEQ (1080 MG) SR tablet Take 2 tablets (20 mEq total) by mouth every Monday, Wednesday, and Friday. 15 tablet 0  . traZODone (DESYREL) 50 MG tablet Take 1 tablet (50 mg total) by mouth at bedtime. 30 tablet 2   No facility-administered medications prior to visit.      Allergies:   Clonazepam, Codeine, and Sulfa antibiotics   Social History   Socioeconomic History  . Marital status: Widowed    Spouse name: Not on file  . Number of children: Not on file  . Years of education: Not on file  . Highest education level: Not on file  Occupational History  . Not on file  Social Needs  . Financial resource strain: Not on file   . Food insecurity    Worry: Not on file    Inability: Not on file  . Transportation needs    Medical: Not on file    Non-medical: Not on file  Tobacco Use  . Smoking status: Never Smoker  . Smokeless tobacco: Never Used  Substance and Sexual Activity  . Alcohol use: No    Alcohol/week: 0.0 standard drinks  . Drug use: No  . Sexual activity: Never  Lifestyle  . Physical activity    Days per week: Not on file    Minutes per session: Not on file  . Stress: Not on file  Relationships  . Social connections    Talks on phone: Not on  file    Gets together: Not on file    Attends religious service: Not on file    Active member of club or organization: Not on file    Attends meetings of clubs or organizations: Not on file    Relationship status: Not on file  Other Topics Concern  . Not on file  Social History Narrative  . Not on file     Family History:  The patient's family history includes Heart attack in her father.   Review of Systems:   Please see the history of present illness.     General:  No chills, fever, night sweats or weight changes.  Cardiovascular:  No chest pain, edema, orthopnea, palpitations, paroxysmal nocturnal dyspnea. Positive for dyspnea on exertion.  Dermatological: No rash, lesions/masses Respiratory: No cough, dyspnea Urologic: No hematuria, dysuria Abdominal:   No nausea, vomiting, diarrhea, bright red blood per rectum, melena, or hematemesis Neurologic:  No visual changes or changes in mental status. Positive for weakness along lower extremities.   All other systems reviewed and are otherwise negative except as noted above.   Physical Exam:    VS:  BP 111/65   Pulse 82   Temp (!) 95.1 F (35.1 C)   Ht 5\' 6"  (1.676 m)   Wt 114 lb (51.7 kg)   SpO2 95%   BMI 18.40 kg/m    General: Well developed, well nourished,female appearing in no acute distress. Head: Normocephalic, atraumatic, sclera non-icteric, no xanthomas, nares are without  discharge.  Neck: No carotid bruits. JVD not elevated.  Lungs: Respirations regular and unlabored, without wheezes or rales.  Heart: Irregularly irregular. No S3 or S4.  No murmur, no rubs, or gallops appreciated. Abdomen: Soft, non-tender, non-distended with normoactive bowel sounds. No hepatomegaly. No rebound/guarding. No obvious abdominal masses. Msk:  Strength and tone appear normal for age. No joint deformities or effusions. Extremities: No clubbing or cyanosis. No lower extremity edema.  Distal pedal pulses are 2+ bilaterally. Neuro: Alert and oriented X 3. Moves all extremities spontaneously. No focal deficits noted. Psych:  Responds to questions appropriately with a normal affect. Skin: No rashes or lesions noted  Wt Readings from Last 3 Encounters:  02/18/19 114 lb (51.7 kg)  02/06/19 115 lb 11.9 oz (52.5 kg)  07/25/17 115 lb 14.4 oz (52.6 kg)     Studies/Labs Reviewed:   EKG:  EKG is not ordered today.    Recent Labs: 01/27/2019: ALT 11; B Natriuretic Peptide 354.0; TSH 1.746 02/02/2019: Magnesium 2.1 02/05/2019: Hemoglobin 9.3; Platelets 416 02/06/2019: BUN 33; Creatinine, Ser 1.35; Potassium 4.4; Sodium 136   Lipid Panel No results found for: CHOL, TRIG, HDL, CHOLHDL, VLDL, LDLCALC, LDLDIRECT  Additional studies/ records that were reviewed today include:   Echocardiogram: 12/2018 IMPRESSIONS    1. Left ventricular ejection fraction, by visual estimation, is 60 to 65%. The left ventricle has normal function. There is no left ventricular hypertrophy.  2. Elevated left atrial pressure.  3. Left ventricular diastolic parameters are consistent with Grade II diastolic dysfunction (pseudonormalization).  4. Global right ventricle has normal systolic function.The right ventricular size is normal. No increase in right ventricular wall thickness.  5. Left atrial size was severely dilated.  6. Right atrial size was mildly dilated.  7. The mitral valve is abnormal. Moderate  mitral valve regurgitation. No evidence of mitral stenosis.  8. The MR vena contracta is 0.6 cm. The MV/AV TVI ratio is 1.1. Findings support moderate mitral regurgitation.  9. The tricuspid valve  is normal in structure. Tricuspid valve regurgitation is mild. 10. The aortic valve is tricuspid. Aortic valve regurgitation is not visualized. No evidence of aortic valve sclerosis or stenosis. 11. The pulmonic valve was not well visualized. Pulmonic valve regurgitation is mild. 12. Moderately elevated pulmonary artery systolic pressure. 13. The inferior vena cava is normal in size with greater than 50% respiratory variability, suggesting right atrial pressure of 3 mmHg.  Assessment:    1. Persistent atrial fibrillation ([place])   2. Chronic diastolic heart failure ([place])   3. Essential hypertension   4. Stage 3 chronic kidney disease, unspecified whether stage 3a or 3b CKD      Plan:   In order of problems listed above:  1. Persistent Atrial Fibrillation - This was a new diagnosis for the patient last month and rate control was challenging given her soft BP. She was not started on anticoagulation given her anemia and history of GI bleed. It was reviewed with the patient and her daughter again today that she is at increased risk of a thromboembolic event but they are in agreement to not initiate anticoagulation at this time. - She denies any recent palpitations and heart rate is well controlled in the 80's during today's visit. She is still in atrial fibrillation by examination. She is currently on Cardizem CD 180 mg daily and Toprol-XL 50 mg daily.  Her other daughter is managing her medications and they are unsure if she is taking Amiodarone twice daily or once daily. Will reduce Amiodarone to 200mg  once daily at this time. Would plan to discontinue at the time of her next office visit if heart rate remains well-controlled.  2. Chronic Diastolic CHF - She denies any recent orthopnea, PND or lower  extremity edema. She appears euvolemic by examination today. Continue Lasix 20 mg daily. She did have repeat labs by her PCP this past Monday. Will request a copy of records.  3. HTN - BP is well controlled at 111/65 during today's visit. Continue Cardizem CD 180 mg daily and Toprol-XL 50 mg daily.  4. Stage 3 CKD - Baseline creatinine 1.2 - 1.3. Was at 1.35 at the time of hospital discharge. Will request a copy of recent labs from PCP which were obtained on Monday.   Medication Adjustments/Labs and Tests Ordered: Current medicines are reviewed at length with the patient today.  Concerns regarding medicines are outlined above.  Medication changes, Labs and Tests ordered today are listed in the Patient Instructions below. Patient Instructions  Medication Instructions:  DECREASE Amiodarone to 200 mg daily  *If you need a refill on your cardiac medications before your next appointment, please call your pharmacy*  Lab Work: None today  If you have labs (blood work) drawn today and your tests are completely normal, you will receive your results only by: Marland Kitchen MyChart Message (if you have MyChart) OR . A paper copy in the mail If you have any lab test that is abnormal or we need to change your treatment, we will call you to review the results.  Testing/Procedures: None today  Follow-Up: At [facility], you and your health needs are our priority.  As part of our continuing mission to provide you with exceptional heart care, we have created designated Provider Care Teams.  These Care Teams include your primary Cardiologist (physician) and Advanced Practice Providers (APPs -  Physician Assistants and Nurse Practitioners) who all work together to provide you with the care you need, when you need it.  Your next  appointment:   3 month(s)  The format for your next appointment:   In Person  Provider:   Kate Sable, MD  Other Instructions None     Thank you for choosing [facility] !         Signed, Erma Heritage, PA-C  02/18/2019 5:26 PM    [place] [address] [place], [place] [address] Phone: [phone] Fax: [phone]

## 2019-02-18 NOTE — Patient Instructions (Signed)
Medication Instructions:  DECREASE Amiodarone to 200 mg daily  *If you need a refill on your cardiac medications before your next appointment, please call your pharmacy*  Lab Work: None today  If you have labs (blood work) drawn today and your tests are completely normal, you will receive your results only by: Marland Kitchen MyChart Message (if you have MyChart) OR . A paper copy in the mail If you have any lab test that is abnormal or we need to change your treatment, we will call you to review the results.  Testing/Procedures: None today  Follow-Up: At [facility], you and your health needs are our priority.  As part of our continuing mission to provide you with exceptional heart care, we have created designated Provider Care Teams.  These Care Teams include your primary Cardiologist (physician) and Advanced Practice Providers (APPs -  Physician Assistants and Nurse Practitioners) who all work together to provide you with the care you need, when you need it.  Your next appointment:   3 month(s)  The format for your next appointment:   In Person  Provider:   Kate Sable, MD  Other Instructions None     Thank you for choosing [facility] !

## 2019-02-19 DIAGNOSIS — I4891 Unspecified atrial fibrillation: Secondary | ICD-10-CM | POA: Diagnosis not present

## 2019-02-19 DIAGNOSIS — I110 Hypertensive heart disease with heart failure: Secondary | ICD-10-CM | POA: Diagnosis not present

## 2019-02-19 DIAGNOSIS — K8689 Other specified diseases of pancreas: Secondary | ICD-10-CM | POA: Diagnosis not present

## 2019-02-19 DIAGNOSIS — D509 Iron deficiency anemia, unspecified: Secondary | ICD-10-CM | POA: Diagnosis not present

## 2019-02-19 DIAGNOSIS — I5033 Acute on chronic diastolic (congestive) heart failure: Secondary | ICD-10-CM | POA: Diagnosis not present

## 2019-02-19 DIAGNOSIS — E785 Hyperlipidemia, unspecified: Secondary | ICD-10-CM | POA: Diagnosis not present

## 2019-02-19 DIAGNOSIS — N179 Acute kidney failure, unspecified: Secondary | ICD-10-CM | POA: Diagnosis not present

## 2019-02-19 DIAGNOSIS — J9601 Acute respiratory failure with hypoxia: Secondary | ICD-10-CM | POA: Diagnosis not present

## 2019-02-19 DIAGNOSIS — R197 Diarrhea, unspecified: Secondary | ICD-10-CM | POA: Diagnosis not present

## 2019-02-19 DIAGNOSIS — K222 Esophageal obstruction: Secondary | ICD-10-CM | POA: Diagnosis not present

## 2019-02-19 DIAGNOSIS — N200 Calculus of kidney: Secondary | ICD-10-CM | POA: Diagnosis not present

## 2019-02-19 DIAGNOSIS — K315 Obstruction of duodenum: Secondary | ICD-10-CM | POA: Diagnosis not present

## 2019-02-19 DIAGNOSIS — R531 Weakness: Secondary | ICD-10-CM | POA: Diagnosis not present

## 2019-02-21 DIAGNOSIS — E785 Hyperlipidemia, unspecified: Secondary | ICD-10-CM | POA: Diagnosis not present

## 2019-02-21 DIAGNOSIS — N179 Acute kidney failure, unspecified: Secondary | ICD-10-CM | POA: Diagnosis not present

## 2019-02-21 DIAGNOSIS — I5033 Acute on chronic diastolic (congestive) heart failure: Secondary | ICD-10-CM | POA: Diagnosis not present

## 2019-02-21 DIAGNOSIS — I4891 Unspecified atrial fibrillation: Secondary | ICD-10-CM | POA: Diagnosis not present

## 2019-02-21 DIAGNOSIS — D509 Iron deficiency anemia, unspecified: Secondary | ICD-10-CM | POA: Diagnosis not present

## 2019-02-21 DIAGNOSIS — K315 Obstruction of duodenum: Secondary | ICD-10-CM | POA: Diagnosis not present

## 2019-02-21 DIAGNOSIS — K222 Esophageal obstruction: Secondary | ICD-10-CM | POA: Diagnosis not present

## 2019-02-21 DIAGNOSIS — J9601 Acute respiratory failure with hypoxia: Secondary | ICD-10-CM | POA: Diagnosis not present

## 2019-02-21 DIAGNOSIS — R531 Weakness: Secondary | ICD-10-CM | POA: Diagnosis not present

## 2019-02-21 DIAGNOSIS — I110 Hypertensive heart disease with heart failure: Secondary | ICD-10-CM | POA: Diagnosis not present

## 2019-02-21 DIAGNOSIS — R197 Diarrhea, unspecified: Secondary | ICD-10-CM | POA: Diagnosis not present

## 2019-02-21 DIAGNOSIS — K8689 Other specified diseases of pancreas: Secondary | ICD-10-CM | POA: Diagnosis not present

## 2019-02-21 DIAGNOSIS — N200 Calculus of kidney: Secondary | ICD-10-CM | POA: Diagnosis not present

## 2019-02-24 DIAGNOSIS — N179 Acute kidney failure, unspecified: Secondary | ICD-10-CM | POA: Diagnosis not present

## 2019-02-24 DIAGNOSIS — R531 Weakness: Secondary | ICD-10-CM | POA: Diagnosis not present

## 2019-02-24 DIAGNOSIS — E785 Hyperlipidemia, unspecified: Secondary | ICD-10-CM | POA: Diagnosis not present

## 2019-02-24 DIAGNOSIS — R197 Diarrhea, unspecified: Secondary | ICD-10-CM | POA: Diagnosis not present

## 2019-02-24 DIAGNOSIS — K315 Obstruction of duodenum: Secondary | ICD-10-CM | POA: Diagnosis not present

## 2019-02-24 DIAGNOSIS — K8689 Other specified diseases of pancreas: Secondary | ICD-10-CM | POA: Diagnosis not present

## 2019-02-24 DIAGNOSIS — D509 Iron deficiency anemia, unspecified: Secondary | ICD-10-CM | POA: Diagnosis not present

## 2019-02-24 DIAGNOSIS — I4891 Unspecified atrial fibrillation: Secondary | ICD-10-CM | POA: Diagnosis not present

## 2019-02-24 DIAGNOSIS — I110 Hypertensive heart disease with heart failure: Secondary | ICD-10-CM | POA: Diagnosis not present

## 2019-02-24 DIAGNOSIS — J9601 Acute respiratory failure with hypoxia: Secondary | ICD-10-CM | POA: Diagnosis not present

## 2019-02-24 DIAGNOSIS — N200 Calculus of kidney: Secondary | ICD-10-CM | POA: Diagnosis not present

## 2019-02-24 DIAGNOSIS — K222 Esophageal obstruction: Secondary | ICD-10-CM | POA: Diagnosis not present

## 2019-02-24 DIAGNOSIS — I5033 Acute on chronic diastolic (congestive) heart failure: Secondary | ICD-10-CM | POA: Diagnosis not present

## 2019-02-26 DIAGNOSIS — E785 Hyperlipidemia, unspecified: Secondary | ICD-10-CM | POA: Diagnosis not present

## 2019-02-26 DIAGNOSIS — I4891 Unspecified atrial fibrillation: Secondary | ICD-10-CM | POA: Diagnosis not present

## 2019-02-26 DIAGNOSIS — K315 Obstruction of duodenum: Secondary | ICD-10-CM | POA: Diagnosis not present

## 2019-02-26 DIAGNOSIS — K222 Esophageal obstruction: Secondary | ICD-10-CM | POA: Diagnosis not present

## 2019-02-26 DIAGNOSIS — R197 Diarrhea, unspecified: Secondary | ICD-10-CM | POA: Diagnosis not present

## 2019-02-26 DIAGNOSIS — N200 Calculus of kidney: Secondary | ICD-10-CM | POA: Diagnosis not present

## 2019-02-26 DIAGNOSIS — K8689 Other specified diseases of pancreas: Secondary | ICD-10-CM | POA: Diagnosis not present

## 2019-02-26 DIAGNOSIS — D509 Iron deficiency anemia, unspecified: Secondary | ICD-10-CM | POA: Diagnosis not present

## 2019-02-26 DIAGNOSIS — R531 Weakness: Secondary | ICD-10-CM | POA: Diagnosis not present

## 2019-02-26 DIAGNOSIS — I5033 Acute on chronic diastolic (congestive) heart failure: Secondary | ICD-10-CM | POA: Diagnosis not present

## 2019-02-26 DIAGNOSIS — N179 Acute kidney failure, unspecified: Secondary | ICD-10-CM | POA: Diagnosis not present

## 2019-02-26 DIAGNOSIS — I110 Hypertensive heart disease with heart failure: Secondary | ICD-10-CM | POA: Diagnosis not present

## 2019-02-26 DIAGNOSIS — J9601 Acute respiratory failure with hypoxia: Secondary | ICD-10-CM | POA: Diagnosis not present

## 2019-03-01 DIAGNOSIS — R410 Disorientation, unspecified: Secondary | ICD-10-CM | POA: Diagnosis not present

## 2019-03-01 DIAGNOSIS — R531 Weakness: Secondary | ICD-10-CM | POA: Diagnosis not present

## 2019-03-01 DIAGNOSIS — I110 Hypertensive heart disease with heart failure: Secondary | ICD-10-CM | POA: Diagnosis not present

## 2019-03-01 DIAGNOSIS — I4891 Unspecified atrial fibrillation: Secondary | ICD-10-CM | POA: Diagnosis not present

## 2019-03-01 DIAGNOSIS — R0989 Other specified symptoms and signs involving the circulatory and respiratory systems: Secondary | ICD-10-CM | POA: Diagnosis not present

## 2019-03-01 DIAGNOSIS — Z743 Need for continuous supervision: Secondary | ICD-10-CM | POA: Diagnosis not present

## 2019-03-01 DIAGNOSIS — R404 Transient alteration of awareness: Secondary | ICD-10-CM | POA: Diagnosis not present

## 2019-03-01 DIAGNOSIS — N179 Acute kidney failure, unspecified: Secondary | ICD-10-CM | POA: Diagnosis not present

## 2019-03-01 DIAGNOSIS — R197 Diarrhea, unspecified: Secondary | ICD-10-CM | POA: Diagnosis not present

## 2019-03-01 DIAGNOSIS — K8689 Other specified diseases of pancreas: Secondary | ICD-10-CM | POA: Diagnosis not present

## 2019-03-01 DIAGNOSIS — J9601 Acute respiratory failure with hypoxia: Secondary | ICD-10-CM | POA: Diagnosis not present

## 2019-03-01 DIAGNOSIS — E785 Hyperlipidemia, unspecified: Secondary | ICD-10-CM | POA: Diagnosis not present

## 2019-03-01 DIAGNOSIS — K315 Obstruction of duodenum: Secondary | ICD-10-CM | POA: Diagnosis not present

## 2019-03-01 DIAGNOSIS — N200 Calculus of kidney: Secondary | ICD-10-CM | POA: Diagnosis not present

## 2019-03-01 DIAGNOSIS — D509 Iron deficiency anemia, unspecified: Secondary | ICD-10-CM | POA: Diagnosis not present

## 2019-03-01 DIAGNOSIS — I5033 Acute on chronic diastolic (congestive) heart failure: Secondary | ICD-10-CM | POA: Diagnosis not present

## 2019-03-01 DIAGNOSIS — K222 Esophageal obstruction: Secondary | ICD-10-CM | POA: Diagnosis not present

## 2019-03-02 ENCOUNTER — Inpatient Hospital Stay (HOSPITAL_COMMUNITY)
Admission: EM | Admit: 2019-03-02 | Discharge: 2019-03-03 | DRG: 291 | Disposition: A | Discharge status: Home Health | Payer: Medicare Other | Attending: Internal Medicine | Admitting: Internal Medicine

## 2019-03-02 ENCOUNTER — Encounter (HOSPITAL_COMMUNITY): Payer: Self-pay | Admitting: Emergency Medicine

## 2019-03-02 ENCOUNTER — Other Ambulatory Visit: Payer: Self-pay

## 2019-03-02 ENCOUNTER — Emergency Department (HOSPITAL_COMMUNITY): Payer: Medicare Other

## 2019-03-02 DIAGNOSIS — Z66 Do not resuscitate: Secondary | ICD-10-CM | POA: Diagnosis present

## 2019-03-02 DIAGNOSIS — K222 Esophageal obstruction: Secondary | ICD-10-CM | POA: Diagnosis present

## 2019-03-02 DIAGNOSIS — Z79899 Other long term (current) drug therapy: Secondary | ICD-10-CM | POA: Diagnosis not present

## 2019-03-02 DIAGNOSIS — I452 Bifascicular block: Secondary | ICD-10-CM | POA: Diagnosis not present

## 2019-03-02 DIAGNOSIS — I5033 Acute on chronic diastolic (congestive) heart failure: Secondary | ICD-10-CM | POA: Diagnosis not present

## 2019-03-02 DIAGNOSIS — I130 Hypertensive heart and chronic kidney disease with heart failure and stage 1 through stage 4 chronic kidney disease, or unspecified chronic kidney disease: Secondary | ICD-10-CM | POA: Diagnosis not present

## 2019-03-02 DIAGNOSIS — K219 Gastro-esophageal reflux disease without esophagitis: Secondary | ICD-10-CM | POA: Diagnosis not present

## 2019-03-02 DIAGNOSIS — J9601 Acute respiratory failure with hypoxia: Secondary | ICD-10-CM | POA: Diagnosis not present

## 2019-03-02 DIAGNOSIS — I4891 Unspecified atrial fibrillation: Secondary | ICD-10-CM

## 2019-03-02 DIAGNOSIS — E785 Hyperlipidemia, unspecified: Secondary | ICD-10-CM | POA: Diagnosis not present

## 2019-03-02 DIAGNOSIS — Z20828 Contact with and (suspected) exposure to other viral communicable diseases: Secondary | ICD-10-CM | POA: Diagnosis present

## 2019-03-02 DIAGNOSIS — K922 Gastrointestinal hemorrhage, unspecified: Secondary | ICD-10-CM | POA: Diagnosis present

## 2019-03-02 DIAGNOSIS — Z85828 Personal history of other malignant neoplasm of skin: Secondary | ICD-10-CM

## 2019-03-02 DIAGNOSIS — R0602 Shortness of breath: Secondary | ICD-10-CM | POA: Diagnosis not present

## 2019-03-02 DIAGNOSIS — F419 Anxiety disorder, unspecified: Secondary | ICD-10-CM | POA: Diagnosis present

## 2019-03-02 DIAGNOSIS — Z8249 Family history of ischemic heart disease and other diseases of the circulatory system: Secondary | ICD-10-CM | POA: Diagnosis not present

## 2019-03-02 DIAGNOSIS — I5043 Acute on chronic combined systolic (congestive) and diastolic (congestive) heart failure: Secondary | ICD-10-CM

## 2019-03-02 DIAGNOSIS — K289 Gastrojejunal ulcer, unspecified as acute or chronic, without hemorrhage or perforation: Secondary | ICD-10-CM | POA: Diagnosis not present

## 2019-03-02 DIAGNOSIS — Z87442 Personal history of urinary calculi: Secondary | ICD-10-CM | POA: Diagnosis not present

## 2019-03-02 DIAGNOSIS — D649 Anemia, unspecified: Secondary | ICD-10-CM | POA: Diagnosis not present

## 2019-03-02 DIAGNOSIS — Z8711 Personal history of peptic ulcer disease: Secondary | ICD-10-CM | POA: Diagnosis not present

## 2019-03-02 DIAGNOSIS — N183 Chronic kidney disease, stage 3 unspecified: Secondary | ICD-10-CM | POA: Diagnosis not present

## 2019-03-02 DIAGNOSIS — I110 Hypertensive heart disease with heart failure: Secondary | ICD-10-CM | POA: Diagnosis not present

## 2019-03-02 DIAGNOSIS — Z923 Personal history of irradiation: Secondary | ICD-10-CM

## 2019-03-02 DIAGNOSIS — N179 Acute kidney failure, unspecified: Secondary | ICD-10-CM

## 2019-03-02 DIAGNOSIS — N1832 Chronic kidney disease, stage 3b: Secondary | ICD-10-CM | POA: Diagnosis present

## 2019-03-02 DIAGNOSIS — I5032 Chronic diastolic (congestive) heart failure: Secondary | ICD-10-CM | POA: Diagnosis present

## 2019-03-02 LAB — COMPREHENSIVE METABOLIC PANEL
ALT: 9 U/L (ref 0–44)
AST: 11 U/L — ABNORMAL LOW (ref 15–41)
Albumin: 3.4 g/dL — ABNORMAL LOW (ref 3.5–5.0)
Alkaline Phosphatase: 70 U/L (ref 38–126)
Anion gap: 8 (ref 5–15)
BUN: 21 mg/dL (ref 8–23)
CO2: 27 mmol/L (ref 22–32)
Calcium: 8.7 mg/dL — ABNORMAL LOW (ref 8.9–10.3)
Chloride: 103 mmol/L (ref 98–111)
Creatinine, Ser: 1.58 mg/dL — ABNORMAL HIGH (ref 0.44–1.00)
GFR calc Af Amer: 34 mL/min — ABNORMAL LOW (ref >60)
GFR calc non Af Amer: 29 mL/min — ABNORMAL LOW (ref >60)
Glucose, Bld: 116 mg/dL — ABNORMAL HIGH (ref 70–99)
Potassium: 4.8 mmol/L (ref 3.5–5.1)
Sodium: 138 mmol/L (ref 135–145)
Total Bilirubin: 0.2 mg/dL — ABNORMAL LOW (ref 0.3–1.2)
Total Protein: 7 g/dL (ref 6.5–8.1)

## 2019-03-02 LAB — TROPONIN I (HIGH SENSITIVITY)
Troponin I (High Sensitivity): 5 ng/L (ref <18)
Troponin I (High Sensitivity): 5 ng/L (ref <18)

## 2019-03-02 LAB — CBC
HCT: 34 % — ABNORMAL LOW (ref 36.0–46.0)
Hemoglobin: 10.2 g/dL — ABNORMAL LOW (ref 12.0–15.0)
MCH: 28.7 pg (ref 26.0–34.0)
MCHC: 30 g/dL (ref 30.0–36.0)
MCV: 95.8 fL (ref 80.0–100.0)
Platelets: 277 10*3/uL (ref 150–400)
RBC: 3.55 MIL/uL — ABNORMAL LOW (ref 3.87–5.11)
RDW: 14.1 % (ref 11.5–15.5)
WBC: 8.5 10*3/uL (ref 4.0–10.5)
nRBC: 0 % (ref 0.0–0.2)

## 2019-03-02 LAB — TSH: TSH: 8.065 u[IU]/mL — ABNORMAL HIGH (ref 0.350–4.500)

## 2019-03-02 LAB — MAGNESIUM: Magnesium: 2 mg/dL (ref 1.7–2.4)

## 2019-03-02 LAB — SARS CORONAVIRUS 2 (TAT 6-24 HRS): SARS Coronavirus 2: NEGATIVE

## 2019-03-02 LAB — POC SARS CORONAVIRUS 2 AG -  ED: SARS Coronavirus 2 Ag: NEGATIVE

## 2019-03-02 LAB — BRAIN NATRIURETIC PEPTIDE: B Natriuretic Peptide: 370 pg/mL — ABNORMAL HIGH (ref 0.0–100.0)

## 2019-03-02 LAB — T4, FREE: Free T4: 1.09 ng/dL (ref 0.61–1.12)

## 2019-03-02 IMAGING — CR DG CHEST 1V PORT
1 series · 1 of 1 positions shown · non-contrast
Comparison: [DATE]

CLINICAL DATA: AFib, shortness of breath

EXAM:
PORTABLE CHEST 1 VIEW

[portable]
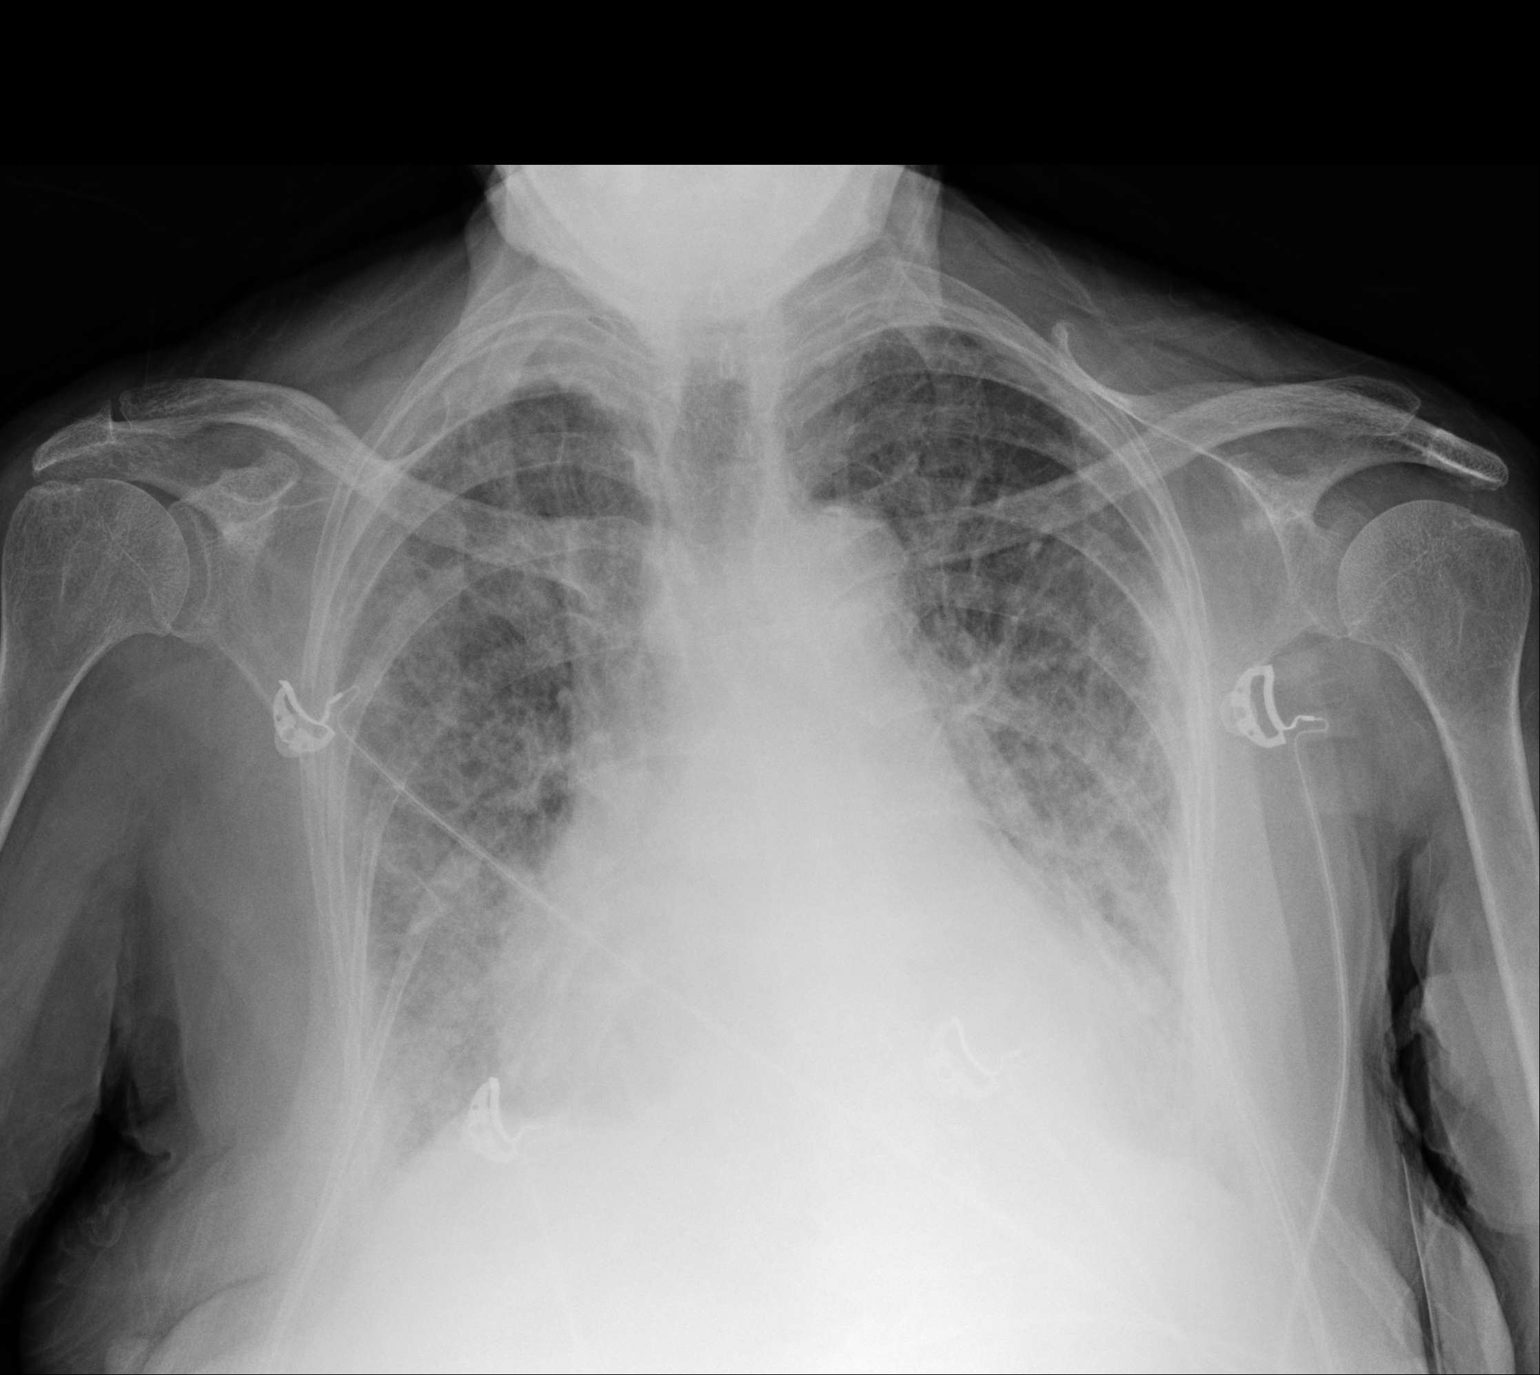

[1 of 1 positions shown; findings below may reference images not displayed]

FINDINGS: There is cardiomegaly. Increased interstitial markings are now seen
throughout both lungs. There is blunting of the bilateral
costophrenic angles which could be due to trace bilateral pleural
effusions. No acute osseous abnormality.
IMPRESSION: Cardiomegaly and interstitial edema

Probable trace bilateral pleural effusions

## 2019-03-02 MED ORDER — FUROSEMIDE 10 MG/ML IJ SOLN
20.0000 mg | Freq: Once | INTRAMUSCULAR | Status: AC
  Administered 2019-03-02: 20 mg via INTRAVENOUS
  Filled 2019-03-02: qty 2

## 2019-03-02 MED ORDER — ACETAMINOPHEN 325 MG PO TABS: 650.0000 mg | ORAL_TABLET | Freq: Four times a day (QID) | ORAL | Status: DC | PRN

## 2019-03-02 MED ORDER — FUROSEMIDE 10 MG/ML IJ SOLN
20.0000 mg | Freq: Two times a day (BID) | INTRAMUSCULAR | Status: DC
  Administered 2019-03-03: 20 mg via INTRAVENOUS
  Filled 2019-03-02 (×2): qty 2

## 2019-03-02 MED ORDER — SODIUM CHLORIDE 0.9 % IV BOLUS
500.0000 mL | Freq: Once | INTRAVENOUS | Status: AC
  Administered 2019-03-02: 500 mL via INTRAVENOUS

## 2019-03-02 MED ORDER — HEPARIN SODIUM (PORCINE) 5000 UNIT/ML IJ SOLN
5000.0000 [IU] | Freq: Three times a day (TID) | INTRAMUSCULAR | Status: DC
  Administered 2019-03-02 – 2019-03-03 (×3): 5000 [IU] via SUBCUTANEOUS
  Filled 2019-03-02 (×4): qty 1

## 2019-03-02 MED ORDER — METOPROLOL SUCCINATE ER 50 MG PO TB24
62.5000 mg | ORAL_TABLET | Freq: Every day | ORAL | Status: AC
  Administered 2019-03-02: 62.5 mg via ORAL
  Filled 2019-03-02: qty 1

## 2019-03-02 MED ORDER — SODIUM CHLORIDE 0.9% FLUSH
3.0000 mL | Freq: Two times a day (BID) | INTRAVENOUS | Status: DC
  Administered 2019-03-02 – 2019-03-03 (×3): 3 mL via INTRAVENOUS

## 2019-03-02 MED ORDER — PROCHLORPERAZINE EDISYLATE 10 MG/2ML IJ SOLN: 5.0000 mg | Freq: Four times a day (QID) | INTRAMUSCULAR | Status: DC | PRN

## 2019-03-02 MED ORDER — DICYCLOMINE HCL 10 MG PO CAPS: 10.0000 mg | ORAL_CAPSULE | ORAL | Status: DC | PRN

## 2019-03-02 MED ORDER — ACETAMINOPHEN 650 MG RE SUPP: 650.0000 mg | Freq: Four times a day (QID) | RECTAL | Status: DC | PRN

## 2019-03-02 MED ORDER — DILTIAZEM HCL ER COATED BEADS 180 MG PO CP24
180.0000 mg | ORAL_CAPSULE | Freq: Every day | ORAL | Status: DC
  Administered 2019-03-02 – 2019-03-03 (×2): 180 mg via ORAL
  Filled 2019-03-02 (×2): qty 1

## 2019-03-02 MED ORDER — METOPROLOL SUCCINATE ER 25 MG PO TB24
12.5000 mg | ORAL_TABLET | Freq: Once | ORAL | Status: AC
  Administered 2019-03-02: 12.5 mg via ORAL

## 2019-03-02 MED ORDER — PANTOPRAZOLE SODIUM 40 MG PO TBEC
40.0000 mg | DELAYED_RELEASE_TABLET | Freq: Every day | ORAL | Status: DC
  Administered 2019-03-02 – 2019-03-03 (×2): 40 mg via ORAL
  Filled 2019-03-02 (×2): qty 1

## 2019-03-02 MED ORDER — ONDANSETRON HCL 4 MG/2ML IJ SOLN: 4.0000 mg | Freq: Four times a day (QID) | INTRAMUSCULAR | Status: DC | PRN

## 2019-03-02 MED ORDER — ALPRAZOLAM 0.25 MG PO TABS
0.2500 mg | ORAL_TABLET | Freq: Two times a day (BID) | ORAL | Status: DC | PRN
  Filled 2019-03-02: qty 1

## 2019-03-02 MED ORDER — SODIUM CHLORIDE 0.9 % IV SOLN: 250.0000 mL | INTRAVENOUS | Status: DC | PRN

## 2019-03-02 MED ORDER — ACETAMINOPHEN 325 MG PO TABS: 650.0000 mg | ORAL_TABLET | ORAL | Status: DC | PRN

## 2019-03-02 MED ORDER — SODIUM CHLORIDE 0.9% FLUSH: 3.0000 mL | INTRAVENOUS | Status: DC | PRN

## 2019-03-02 MED ORDER — AMIODARONE HCL 200 MG PO TABS
200.0000 mg | ORAL_TABLET | Freq: Every day | ORAL | Status: DC
  Administered 2019-03-02 – 2019-03-03 (×2): 200 mg via ORAL
  Filled 2019-03-02 (×2): qty 1

## 2019-03-02 MED ORDER — SODIUM CHLORIDE 0.9 % IV BOLUS: 1000.0000 mL | Freq: Once | INTRAVENOUS | Status: DC

## 2019-03-02 MED ORDER — METOPROLOL SUCCINATE ER 50 MG PO TB24
75.0000 mg | ORAL_TABLET | Freq: Every day | ORAL | Status: DC
  Administered 2019-03-03: 75 mg via ORAL
  Filled 2019-03-02: qty 1

## 2019-03-02 NOTE — ED Triage Notes (Signed)
[facility] - EMS called out for pt's "Afib acting up." On EMS arrival heart rate ranging from 130-140s, O2 81% on RA now 100% on 15L via NRB. Pt states she started feeing bad after dinner

## 2019-03-02 NOTE — H&P (Signed)
History and Physical    Renee Blake [id number] DOB: 06-15-1928 DOA: 03/02/2019  Referring MD/NP/PA: Dr. Ashok Cordia PCP: [name], MD  Patient coming from: Home  Chief Complaint: Palpitations, shortness of breath and hypoxia.  HPI: Renee Blake is a 85 y.o. female with past medical history significant for atrial fibrillation, chronic anemia and life-threatening GI bleed, hypertension, hyperlipidemia and GERD/gastric ulcer; who presented to the hospital secondary to palpitations, heart racing sensation and shortness of breath.  EMS was called and on arrival to patient's home was found to be in A. fib with RVR heart rate in the 140s and hypoxic.  Patient denies chest pain, no nausea, no vomiting, no diaphoresis, no dysuria, no hematuria, no headaches, no focal weakness or any other complaints.  Prior to this event patient reports doing okay and was in her usual health status. Patient reports to be compliant with her medications and also with low-sodium diet.  In the ED patient found to be hypoxic at 84% on room air, elevated BNP and a chest x-ray demonstrated interstitial edema.  IV Lasix given.  Patient heart rate regulated and drifted down to control atrial fibrillation on his own.   Initial labs demonstrated WBCs of 8.5, hemoglobin 10.2, platelets 277, sodium 138, potassium 4.8 and a creatinine of 1.58 (previously 1.34 in November 2020).  TSH 8.065.  Magnesium 2.0.  Negative troponin x1.  [facility] contacted to admit patient for further evaluation and management.  Past Medical/Surgical History: Past Medical History:  Diagnosis Date  . Acute blood loss anemia   . Atrial fibrillation ([place])    a. diagnosed in 12/2018. Rate-control pursued given not a candidate for anticoagulation  . Chronic diarrhea   . Dilation of biliary tract    biliary duct per 02/26/13 CT  . Duodenal stricture   . Esophageal stricture   . Gastric ulcer   . GI bleed   . Hyperlipemia   . Hypertension   .  Microscopic colitis   . Nephrolithiasis   . Pancreatic duct dilated 02/26/13  . Schatzki's ring   . Skin cancer   . Sliding hiatal hernia   . Vertigo     Past Surgical History:  Procedure Laterality Date  . BIOPSY  07/28/2017   Procedure: BIOPSY;  Surgeon: Rogene Houston, MD;  [facility]: [facility];  Service: Endoscopy;;  rectum  . COLONOSCOPY N/A 07/28/2017   Procedure: COLONOSCOPY;  Surgeon: Rogene Houston, MD;  Location: [facility];  Service: Endoscopy;  Laterality: N/A;  . ESOPHAGOGASTRODUODENOSCOPY N/A 02/27/2013   Procedure: ESOPHAGOGASTRODUODENOSCOPY (EGD);  Surgeon: Ladene Artist, MD;  Location[facility] ENDOSCOPY;  Service: Endoscopy;  Laterality: N/A;  . ESOPHAGOGASTRODUODENOSCOPY N/A 03/28/2013   Procedure: ESOPHAGOGASTRODUODENOSCOPY (EGD);  Surgeon: Rogene Houston, MD;  [facility]: [facility];  Service: Endoscopy;  Laterality: N/A;  . HOT HEMOSTASIS N/A 02/27/2013   Procedure: HOT HEMOSTASIS (ARGON PLASMA COAGULATION/BICAP);  Surgeon: Ladene Artist, MD;  Location: [facility] ENDOSCOPY;  Service: Endoscopy;  Laterality: N/A;  . NOSE SURGERY     for skin cancer  . POLYPECTOMY  07/28/2017   Procedure: POLYPECTOMY;  Surgeon: Rogene Houston, MD;  Location: [facility];  Service: Endoscopy;;  colon     Social History:  reports that she has never smoked. She has never used smokeless tobacco. She reports that she does not drink alcohol or use drugs.  Allergies: Allergies  Allergen Reactions  . Clonazepam Other (See Comments)    Pruritis  . Codeine Nausea  And Vomiting and Other (See Comments)    Reaction unknown  . Sulfa Antibiotics Other (See Comments)    Reactions unknown    Family History:  Family History  Problem Relation Age of Onset  . Heart attack Father     Prior to Admission medications   Medication Sig Start Date End Date Taking? Authorizing Provider  acetaminophen (TYLENOL) 325 MG tablet Take 2 tablets (650 mg total) by mouth every 6 (six) hours as  needed for mild pain, fever or headache (or Fever >/= 101). 02/06/19   Roxan Hockey, MD  ALPRAZolam Duanne Moron) 0.25 MG tablet Take 1 tablet (0.25 mg total) by mouth 2 (two) times daily as needed. for anxiety 02/06/19   Roxan Hockey, MD  amiodarone (PACERONE) 200 MG tablet Take 1 tablet (200 mg total) by mouth daily. 02/18/19   Strader, Fransisco Hertz, PA-C  dicyclomine (BENTYL) 10 MG capsule Take 10 mg by mouth every 4 (four) hours as needed for spasms.    [provider]  diltiazem (CARDIZEM CD) 180 MG 24 hr capsule Take 1 capsule (180 mg total) by mouth daily. 02/18/19   Strader, Fransisco Hertz, PA-C  diltiazem (CARDIZEM) 30 MG tablet Take 1 tablet (30 mg total) by mouth every 12 (twelve) hours as needed. For HR > 120 bpm 02/06/19 02/06/20  Roxan Hockey, MD  furosemide (LASIX) 20 MG tablet Take 1 tablet (20 mg total) by mouth daily. 02/07/19   Roxan Hockey, MD  levocetirizine (XYZAL) 5 MG tablet Take 5 mg by mouth every evening.    [provider]  meclizine (ANTIVERT) 25 MG tablet Take 25 mg by mouth daily as needed for dizziness.  07/11/17   [provider]  metoprolol succinate (TOPROL-XL) 50 MG 24 hr tablet Take 1 tablet (50 mg total) by mouth daily. 02/18/19 02/18/20  Strader, Fransisco Hertz, PA-C  ondansetron (ZOFRAN) 4 MG tablet Take 4 mg by mouth every 8 (eight) hours as needed for nausea or vomiting.    [provider]  pantoprazole (PROTONIX) 40 MG tablet Take 1 tablet (40 mg total) by mouth daily. 02/06/19   Roxan Hockey, MD    Review of Systems:  Positive for dyspnea on exertion (which is not new) and orthopnea; otherwise review of system negative except as mentioned in HPI.   Physical Exam: Vitals:   03/02/19 0500 03/02/19 0600 03/02/19 0630 03/02/19 0751  BP: 118/87 125/87 104/82 (!) 134/95  Pulse: (!) 118 90 (!) 105 86  Resp: (!) 23 20 19 16   Temp:    (!) 97.5 F (36.4 C)  TempSrc:    Oral  SpO2: 98% 93% 95% 95%  Weight:    51.5 kg   Height:    5\' 4"  (1.626 m)    Constitutional: NAD, calm, comfortable; denies chest pain, no palpitations currently.  Wearing 2 L nasal cannula supplementation with good O2 sat. Eyes: PERRL, lids and conjunctivae normal ENMT: Mucous membranes are moist. Posterior pharynx clear of any exudate or lesions.Normal dentition.  Neck: normal, supple, no masses, no thyromegaly Respiratory: Fair air movement bilaterally; fine crackles at the bases, no wheezing, no using accessory muscles. Cardiovascular: Rate controlled, irregular rhythm; no rubs, no gallops, no murmurs.  Positive JVD.   Abdomen: no tenderness, no masses palpated. No hepatosplenomegaly. Bowel sounds positive.  Musculoskeletal: no clubbing / cyanosis. No joint deformity upper and lower extremities. Good ROM, no contractures. Normal muscle tone.  Skin: no rashes, lesions, ulcers. No induration Neurologic: CN 2-12 grossly intact. Sensation intact, DTR  normal. Strength 5/5 in all 4.  Psychiatric: Normal judgment and insight. Alert and oriented x 3. Normal mood.    Labs on Admission: I have personally reviewed the following labs and imaging studies  CBC: Recent Labs  Lab 03/02/19 0124  WBC 8.5  HGB 10.2*  HCT 34.0*  MCV 95.8  PLT 99991111   Basic Metabolic Panel: Recent Labs  Lab 03/02/19 0124 03/02/19 0455  NA 138  --   K 4.8  --   CL 103  --   CO2 27  --   GLUCOSE 116*  --   BUN 21  --   CREATININE 1.58*  --   CALCIUM 8.7*  --   MG  --  2.0   GFR: Estimated Creatinine Clearance: 20 mL/min (A) (by C-G formula based on SCr of 1.58 mg/dL (H)).   Liver Function Tests: Recent Labs  Lab 03/02/19 0124  AST 11*  ALT 9  ALKPHOS 70  BILITOT 0.2*  PROT 7.0  ALBUMIN 3.4*   Urine analysis:    Component Value Date/Time   COLORURINE YELLOW 01/27/2019 1533   APPEARANCEUR CLOUDY (A) 01/27/2019 1533   LABSPEC 1.005 01/27/2019 1533   PHURINE 7.0 01/27/2019 1533   GLUCOSEU NEGATIVE 01/27/2019 1533   HGBUR SMALL (A)  01/27/2019 1533   [place] 01/27/2019 1533   [place] 01/27/2019 1533   PROTEINUR NEGATIVE 01/27/2019 1533   UROBILINOGEN 0.2 08/22/2014 1300   NITRITE NEGATIVE 01/27/2019 1533   LEUKOCYTESUR LARGE (A) 01/27/2019 1533   Radiological Exams on Admission: Xr Chest Portable  Result Date: 03/02/2019 CLINICAL DATA:  AFib, shortness of breath EXAM: PORTABLE CHEST 1 VIEW COMPARISON:  January 31, 2019 FINDINGS: There is cardiomegaly. Increased interstitial markings are now seen throughout both lungs. There is blunting of the bilateral costophrenic angles which could be due to trace bilateral pleural effusions. No acute osseous abnormality. IMPRESSION: Cardiomegaly and interstitial edema Probable trace bilateral pleural effusions Electronically Signed   By: Prudencio Pair M.D.   On: 03/02/2019 03:31    EKG: Independently reviewed.  Unchanged right bundle branch block; atrial fibrillation heart rate in the 113 range.  Assessment/Plan 1-Acute on chronic diastolic congestive heart failure (HCC) -In the setting of A. fib with RVR and interstitial edema. -IV Lasix has been started -Oxygen supplementation provided -Patient admitted to telemetry bed -Cardiology service has been consulted -Negative troponin -Will adjust beta-blocker dose to 62.5 mg and follow response. -Low-sodium diet, daily weights and strict I's and O's. -Recent 2D echo demonstrating preserved ejection fraction and impaired relaxation.  2-A. fib with RVR -As mentioned above we will adjust beta-blocker dose -Continue amiodarone and Cardizem -Cardiology service have been consulted -Recent 2D echo done and no need to be repleted. -Negative troponin -Not a candidate for anticoagulation secondary to life-threatening GI bleed.  3-anxiety -Overall stable -Continue PRN xanax  4-GERD/GI ulcer -continue PPI  5-physical deconditioning -resume HH services at discharge  6-acute resp failure with hypoxia  -secondary to #1 -continue oxygen supplementation and wean off to RA as tolerated.  7-elevated TSH -in the setting of sick thyroid maybe. -will check free T4  8-chronic anemia -stable Hgb level, 10.2 currently -no overt bleeding -continue monitoring  9-CKD stage 3b -essentially at baseline, even Cr level slightly higher that recent work up. -will closely monitor trend while providing diuresis. -minimize nephrotoxic agents.    DVT prophylaxis: heparin  Code Status: full Code  Family Communication: no family at bedside   Disposition Plan: home once heart rate  is controlled and CHF stabilized  Consults called: cardiology service. Admission status: inpatient, LOS > 2 midnights, telemetry bed.   Time Spent: 70 minutes.  Barton Dubois MD [facility] Pager [phone]   03/02/2019, 8:11 AM

## 2019-03-02 NOTE — ED Provider Notes (Signed)
[facility] EMERGENCY DEPARTMENT Provider Note   CSN: [id number] Arrival date & time: 03/02/19  0020     History   Chief Complaint Chief Complaint  Patient presents with  . Irregular Heart Beat    HPI NYKERIA MEALING is a 85 y.o. female.     Patient with hx afib, presents via EMS indicating her afib was acting up, ems noted hr in 130-140 range. Patient also noted general malaise, felt tired. Symptoms acute onset tonight, moderate, persistent, but now improved. Denies syncope. No chest pain or discomfort. No sob. States compliant w home meds, no recent change. Patient denies headache. No cough or uri symptoms. No fever or chills. No abd pain or nvd. No gu c/o. No leg pain or swelling.   The history is provided by the patient.    Past Medical History:  Diagnosis Date  . Acute blood loss anemia   . Atrial fibrillation ([place])    a. diagnosed in 12/2018. Rate-control pursued given not a candidate for anticoagulation  . Chronic diarrhea   . Dilation of biliary tract    biliary duct per 02/26/13 CT  . Duodenal stricture   . Esophageal stricture   . Gastric ulcer   . GI bleed   . Hyperlipemia   . Hypertension   . Microscopic colitis   . Nephrolithiasis   . Pancreatic duct dilated 02/26/13  . Schatzki's ring   . Skin cancer   . Sliding hiatal hernia   . Vertigo     Patient Active Problem List   Diagnosis Date Noted  . Acute exacerbation of CHF (congestive heart failure) ([place]) 01/27/2019  . New onset a-fib with RVR 01/27/2019  . Iron deficiency anemia due to chronic blood loss---H/o Gi Bleed 01/27/2019  . Vaginal mass 07/29/2017  . Heme positive stool 07/25/2017  . Cellulitis 12/10/2016  . Facial cellulitis   . Erroneous encounter - disregard 03/17/2015  . AKI (acute kidney injury) ([place])   . Metabolic acidosis 86/38/1771  . Acute renal failure syndrome ([place])   . Renal failure 08/22/2014  . Hyperkalemia 08/22/2014  . UTI (urinary tract infection) 08/22/2014  .  Acute renal failure ([place]) 08/22/2014  . Dyspnea 03/15/2014  . Diabetes mellitus ([place]) 06/08/2013  . Acute diastolic congestive heart failure ([place]) 06/07/2013  . Syncope 03/26/2013  . Rectal bleeding 03/26/2013  . Dehydration 03/26/2013  . Generalized weakness 03/26/2013  . Malnutrition of moderate degree ([place]) 03/01/2013  . Diarrhea 02/28/2013  . Loss of weight 02/28/2013  . H/o Prior duodenal ulcer with hemorrhage 02/27/2013  . H/o Prior Lower GI bleed (anorectal Ca) 02/26/2013  . Acute blood loss anemia 02/26/2013  . UTI (lower urinary tract infection) 02/26/2013  . Hypertension 02/26/2013    Past Surgical History:  Procedure Laterality Date  . BIOPSY  07/28/2017   Procedure: BIOPSY;  Surgeon: Rogene Houston, MD;  [facility];  Service: Endoscopy;;  rectum  . COLONOSCOPY N/A 07/28/2017   Procedure: COLONOSCOPY;  Surgeon: Rogene Houston, MD;  [facility]: [facility];  Service: Endoscopy;  Laterality: N/A;  . ESOPHAGOGASTRODUODENOSCOPY N/A 02/27/2013   Procedure: ESOPHAGOGASTRODUODENOSCOPY (EGD);  Surgeon: Ladene Artist, MD;  Location: [facility] ENDOSCOPY;  Service: Endoscopy;  Laterality: N/A;  . ESOPHAGOGASTRODUODENOSCOPY N/A 03/28/2013   Procedure: ESOPHAGOGASTRODUODENOSCOPY (EGD);  Surgeon: Rogene Houston, MD;  [facility];  Service: Endoscopy;  Laterality: N/A;  . HOT HEMOSTASIS N/A 02/27/2013   Procedure: HOT HEMOSTASIS (ARGON PLASMA COAGULATION/BICAP);  Surgeon: Ladene Artist, MD;  Location: [facility] ENDOSCOPY;  Service: Endoscopy;  Laterality: N/A;  . NOSE SURGERY     for skin cancer  . POLYPECTOMY  07/28/2017   Procedure: POLYPECTOMY;  Surgeon: Rogene Houston, MD;  Location: [facility];  Service: Endoscopy;;  colon      OB History    Gravida  6   Para  6   Term  6   Preterm      AB      Living  6     SAB      TAB      Ectopic      Multiple      Live Births               Home Medications    Prior to Admission  medications   Medication Sig Start Date End Date Taking? Authorizing Provider  acetaminophen (TYLENOL) 325 MG tablet Take 2 tablets (650 mg total) by mouth every 6 (six) hours as needed for mild pain, fever or headache (or Fever >/= 101). 02/06/19   Roxan Hockey, MD  ALPRAZolam Duanne Moron) 0.25 MG tablet Take 1 tablet (0.25 mg total) by mouth 2 (two) times daily as needed. for anxiety 02/06/19   Roxan Hockey, MD  amiodarone (PACERONE) 200 MG tablet Take 1 tablet (200 mg total) by mouth daily. 02/18/19   Strader, Fransisco Hertz, PA-C  dicyclomine (BENTYL) 10 MG capsule Take 10 mg by mouth every 4 (four) hours as needed for spasms.    [provider]  diltiazem (CARDIZEM CD) 180 MG 24 hr capsule Take 1 capsule (180 mg total) by mouth daily. 02/18/19   Strader, Fransisco Hertz, PA-C  diltiazem (CARDIZEM) 30 MG tablet Take 1 tablet (30 mg total) by mouth every 12 (twelve) hours as needed. For HR > 120 bpm 02/06/19 02/06/20  Roxan Hockey, MD  furosemide (LASIX) 20 MG tablet Take 1 tablet (20 mg total) by mouth daily. 02/07/19   Roxan Hockey, MD  levocetirizine (XYZAL) 5 MG tablet Take 5 mg by mouth every evening.    [provider]  meclizine (ANTIVERT) 25 MG tablet Take 25 mg by mouth daily as needed for dizziness.  07/11/17   [provider]  metoprolol succinate (TOPROL-XL) 50 MG 24 hr tablet Take 1 tablet (50 mg total) by mouth daily. 02/18/19 02/18/20  Strader, Fransisco Hertz, PA-C  ondansetron (ZOFRAN) 4 MG tablet Take 4 mg by mouth every 8 (eight) hours as needed for nausea or vomiting.    [provider]  pantoprazole (PROTONIX) 40 MG tablet Take 1 tablet (40 mg total) by mouth daily. 02/06/19   Roxan Hockey, MD    Family History Family History  Problem Relation Age of Onset  . Heart attack Father     Social History Social History   Tobacco Use  . Smoking status: Never Smoker  . Smokeless tobacco: Never Used  Substance Use Topics  . Alcohol use: No     Alcohol/week: 0.0 standard drinks  . Drug use: No     Allergies   Clonazepam, Codeine, and Sulfa antibiotics   Review of Systems Review of Systems  Constitutional: Negative for chills and fever.  HENT: Negative for sore throat.   Eyes: Negative for visual disturbance.  Respiratory: Negative for cough and shortness of breath.   Cardiovascular: Positive for palpitations. Negative for chest pain and leg swelling.  Gastrointestinal: Negative for abdominal pain, blood in stool and vomiting.  Endocrine: Negative for polyuria.  Genitourinary: Negative for dysuria  and flank pain.  Musculoskeletal: Negative for back pain and neck pain.  Skin: Negative for rash.  Neurological: Negative for syncope, speech difficulty, weakness, numbness and headaches.  Hematological: Does not bruise/bleed easily.  Psychiatric/Behavioral: Negative for confusion.     Physical Exam Updated Vital Signs BP 131/69   Pulse (!) 102   Temp 97.9 F (36.6 C)   Resp 18   Ht 1.676 m ('5\' 6"'$ )   Wt 50.8 kg   SpO2 100%   BMI 18.08 kg/m   Physical Exam   ED Treatments / Results  Labs (all labs ordered are listed, but only abnormal results are displayed) Results for orders placed or performed during the hospital encounter of 03/02/19  CBC  Result Value Ref Range   WBC 8.5 4.0 - 10.5 K/uL   RBC 3.55 (L) 3.87 - 5.11 MIL/uL   Hemoglobin 10.2 (L) 12.0 - 15.0 g/dL   HCT 34.0 (L) 36.0 - 46.0 %   MCV 95.8 80.0 - 100.0 fL   MCH 28.7 26.0 - 34.0 pg   MCHC 30.0 30.0 - 36.0 g/dL   RDW 14.1 11.5 - 15.5 %   Platelets 277 150 - 400 K/uL   nRBC 0.0 0.0 - 0.2 %  CMET  Result Value Ref Range   Sodium 138 135 - 145 mmol/L   Potassium 4.8 3.5 - 5.1 mmol/L   Chloride 103 98 - 111 mmol/L   CO2 27 22 - 32 mmol/L   Glucose, Bld 116 (H) 70 - 99 mg/dL   BUN 21 8 - 23 mg/dL   Creatinine, Ser 1.58 (H) 0.44 - 1.00 mg/dL   Calcium 8.7 (L) 8.9 - 10.3 mg/dL   Total Protein 7.0 6.5 - 8.1 g/dL   Albumin 3.4 (L) 3.5 - 5.0 g/dL    AST 11 (L) 15 - 41 U/L   ALT 9 0 - 44 U/L   Alkaline Phosphatase 70 38 - 126 U/L   Total Bilirubin 0.2 (L) 0.3 - 1.2 mg/dL   GFR calc non Af Amer 29 (L) >60 mL/min   GFR calc Af Amer 34 (L) >60 mL/min   Anion gap 8 5 - 15  Brain natriuretic peptide  Result Value Ref Range   B Natriuretic Peptide 370.0 (H) 0.0 - 100.0 pg/mL  POC SARS Coronavirus 2 Ag-ED - Nasal Swab (BD Veritor Kit)  Result Value Ref Range   SARS Coronavirus 2 Ag NEGATIVE NEGATIVE  Troponin I (High Sensitivity)  Result Value Ref Range   Troponin I (High Sensitivity) 5 <18 ng/L  Troponin I (High Sensitivity)  Result Value Ref Range   Troponin I (High Sensitivity) 5 <18 ng/L   Dg Chest Port 1 View  Result Date: 01/31/2019 CLINICAL DATA:  Congestive heart failure. EXAM: PORTABLE CHEST 1 VIEW COMPARISON:  Chest x-rays dated 01/27/2019 and 10/27/2014. FINDINGS: Decreased interstitial edema bilaterally suggesting improved fluid status. Coarse lung markings remain bilaterally indicating some degree of underlying chronic interstitial lung disease and/or chronic bronchitic change, with associated lung hyperexpansion. No confluent opacity to suggest a pneumonia. No pleural effusion or pneumothorax is seen. Heart size is upper normal. Overall cardiomediastinal silhouette is stable. Osseous structures about the chest are unremarkable. IMPRESSION: 1. Decreased interstitial edema bilaterally suggesting improved fluid status. 2. Hyperexpanded lungs indicating COPD. Probable associated chronic interstitial lung disease and/or chronic bronchitic change. Electronically Signed   By: Franki Cabot M.D.   On: 01/31/2019 08:52    EKG EKG Interpretation  Date/Time:  Tuesday March 02 2019 00:28:19  EST Ventricular Rate:  113 PR Interval:    QRS Duration: 132 QT Interval:  400 QTC Calculation: 549 R Axis:   -49 Text Interpretation: Atrial fibrillation RBBB and LAFB No significant change since last tracing Confirmed by Lajean Saver  [phone]) on 03/02/2019 12:54:15 AM   Radiology Xr Chest Portable  Result Date: 03/02/2019 CLINICAL DATA:  AFib, shortness of breath EXAM: PORTABLE CHEST 1 VIEW COMPARISON:  January 31, 2019 FINDINGS: There is cardiomegaly. Increased interstitial markings are now seen throughout both lungs. There is blunting of the bilateral costophrenic angles which could be due to trace bilateral pleural effusions. No acute osseous abnormality. IMPRESSION: Cardiomegaly and interstitial edema Probable trace bilateral pleural effusions Electronically Signed   By: Prudencio Pair M.D.   On: 03/02/2019 03:31    Procedures Procedures (including critical care time)  Medications Ordered in ED Medications - No data to display   Initial Impression / Assessment and Plan / ED Course  I have reviewed the triage vital signs and the nursing notes.  Pertinent labs & imaging results that were available during my care of the patient were reviewed by me and considered in my medical decision making (see chart for details).  Iv ns. Continuous pulse ox and monitor. Ecg. Stat labs.  Reviewed nursing notes and prior charts for additional history.  Recent cardiology note - makes note of persistent/chr a fib, not anticoag candidate due to hx gi bleed, ca, etc.   Labs reviwed/interpreted by me - chem normal, creatinine sl increased from prior. small Iv ns bolus.  Recheck - remains in afib, hr 88. No cp.   CXR subsequently returns - +edema. Small eff.  Will give dose of lasix iv.   Initial covid test negative/poc test - will send confirmatory pcr test.   Given hypoxia on room air w room air pulse ox 80-84%, interstitial edema on cxr, and earlier rapid afib - will admit.   Hospitalist consulted for admission.  KAEDANCE MAGOS was evaluated in Emergency Department on 03/02/2019 for the symptoms described in the history of present illness. She was evaluated in the context of the global COVID-19 pandemic, which necessitated  consideration that the patient might be at risk for infection with the SARS-CoV-2 virus that causes COVID-19. Institutional protocols and algorithms that pertain to the evaluation of patients at risk for COVID-19 are in a state of rapid change based on information released by regulatory bodies including the CDC and federal and state organizations. These policies and algorithms were followed during the patient's care in the ED.  CRITICAL CARE RE: atrial fib w rapid ventricular response, acute resp failure/chf with hypoxia, CRI with AKi.  Performed by: Mirna Mires Total critical care time: 40 minutes Critical care time was exclusive of separately billable procedures and treating other patients. Critical care was necessary to treat or prevent imminent or life-threatening deterioration. Critical care was time spent personally by me on the following activities: development of treatment plan with patient and/or surrogate as well as nursing, discussions with consultants, evaluation of patient's response to treatment, examination of patient, obtaining history from patient or surrogate, ordering and performing treatments and interventions, ordering and review of laboratory studies, ordering and review of radiographic studies, pulse oximetry and re-evaluation of patient's condition.   Final Clinical Impressions(s) / ED Diagnoses   Final diagnoses:  None    ED Discharge Orders    None       Lajean Saver, MD 03/02/19 [phone]

## 2019-03-02 NOTE — Consult Note (Addendum)
Cardiology Consult    Patient ID: THAO MANSELL; [id number]; [birth year]   Admit date: 03/02/2019 Date of Consult: 03/02/2019  Primary Care Provider: [name], MD Primary Cardiologist: Kate Sable, MD   Patient Profile    Renee Blake is a 85 y.o. female with past medical history of chronic diastolic CHF, HTN, GIB, history of anorectal cancer (s/p radiation treatment), Stage 3 CKD and chronic anemia who is being seen today for the evaluation of CHF and atrial fibrillation at the request of Dr. Dyann Kief.   History of Present Illness    Renee Blake was most recently admitted to [facility] in 12/2018 for evaluation of nausea and chest discomfort, found to be in atrial fibrillation with RVR upon arrival. Echocardiogram during admission showed a preserved EF with no wall motion abnormalities.  She was transitioned from IV Cardizem to Cardizem CD 180 mg daily along with Toprol-XL 50 mg daily and Amiodarone 200 mg twice daily (for 7 days then 200mg  daily) was added to her regimen prior to discharge. She was not felt to be a candidate for anticoagulation given her anemia and history of GIB.   She did follow-up with myself on 02/18/2019 and reported having continued weakness but denied any specific chest pain or palpitations.  Heart rate was well controlled in the 80's and she was continued on Cardizem CD 180 mg daily and Toprol-XL 50 mg daily with Amiodarone being reduced to 200 mg once daily as it was unclear if she had reduced this from her admission.   She presented back to [facility] ED during the early morning hours of 03/02/2019 reporting worsening palpitations and heart rate have been elevated into the 130's to 140's upon EMS arrival.  In talking with the patient today, she reports being in her usual state of health until last night when she awoke from sleep and could not catch her breath.  Says that her breathing had overall been stable for the past few weeks and she was actually  evaluated by Home Health yesterday and informed that everything looked "good". She denies any changes in her weight or recent edema.  No specific chest pain or palpitations. No fever, chills or sick contacts. She did develop acute PND last night and oxygen saturations were in the 80's on room air but improved with NRB by EMS and she was eventually transitioned to nasal cannula while in the ED.   Initial labs show WBC 8.5, Hgb 10.2, platelets 277, Na+ 138, K+ 4.8 and creatinine 1.58 (previously 1.34 in 01/2019). BNP 370. TSH 8.065. Mg 2.0. Initial and delta Troponin values negative at 5. CXR showed cardiomegaly and interstitial edema with trace pleural effusions. EKG showed atrial fibrillation, HR 113 with known RBBB and LAFB.   Rates were initially in the 130's to 140's but improved into the 80's while in the ED but I cannot see where any AV nodal blocking agents were administered. Received a 500 mL fluid bolus while in the ED but later received IV Lasix 20mg  following CXR results.   Says she feels back to baseline at this time. Reports feeling tired as she did not sleep well while in the ED.    Past Medical History:  Diagnosis Date   Acute blood loss anemia    Atrial fibrillation ([place])    a. diagnosed in 12/2018. Rate-control pursued given not a candidate for anticoagulation   Chronic diarrhea    Dilation of biliary tract    biliary duct per  02/26/13 CT   Duodenal stricture    Esophageal stricture    Gastric ulcer    GI bleed    Hyperlipemia    Hypertension    Microscopic colitis    Nephrolithiasis    Pancreatic duct dilated 02/26/13   Schatzki's ring    Skin cancer    Sliding hiatal hernia    Vertigo     Past Surgical History:  Procedure Laterality Date   BIOPSY  07/28/2017   Procedure: BIOPSY;  Surgeon: Rogene Houston, MD;  [facility];  Service: Endoscopy;;  rectum   COLONOSCOPY N/A 07/28/2017   Procedure: COLONOSCOPY;  Surgeon: Rogene Houston, MD;  Location: [facility];  Service: Endoscopy;  Laterality: N/A;   ESOPHAGOGASTRODUODENOSCOPY N/A 02/27/2013   Procedure: ESOPHAGOGASTRODUODENOSCOPY (EGD);  Surgeon: Ladene Artist, MD;  Location: [facility] ENDOSCOPY;  Service: Endoscopy;  Laterality: N/A;   ESOPHAGOGASTRODUODENOSCOPY N/A 03/28/2013   Procedure: ESOPHAGOGASTRODUODENOSCOPY (EGD);  Surgeon: Rogene Houston, MD;  [facility];  Service: Endoscopy;  Laterality: N/A;   HOT HEMOSTASIS N/A 02/27/2013   Procedure: HOT HEMOSTASIS (ARGON PLASMA COAGULATION/BICAP);  Surgeon: Ladene Artist, MD;  Location: [facility] ENDOSCOPY;  Service: Endoscopy;  Laterality: N/A;   NOSE SURGERY     for skin cancer   POLYPECTOMY  07/28/2017   Procedure: POLYPECTOMY;  Surgeon: Rogene Houston, MD;  Location: [facility];  Service: Endoscopy;;  colon      Home Medications:  Prior to Admission medications   Medication Sig Start Date End Date Taking? Authorizing Provider  acetaminophen (TYLENOL) 325 MG tablet Take 2 tablets (650 mg total) by mouth every 6 (six) hours as needed for mild pain, fever or headache (or Fever >/= 101). 02/06/19   Roxan Hockey, MD  ALPRAZolam Duanne Moron) 0.25 MG tablet Take 1 tablet (0.25 mg total) by mouth 2 (two) times daily as needed. for anxiety 02/06/19   Roxan Hockey, MD  amiodarone (PACERONE) 200 MG tablet Take 1 tablet (200 mg total) by mouth daily. 02/18/19   Strader, Fransisco Hertz, PA-C  dicyclomine (BENTYL) 10 MG capsule Take 10 mg by mouth every 4 (four) hours as needed for spasms.    [provider]  diltiazem (CARDIZEM CD) 180 MG 24 hr capsule Take 1 capsule (180 mg total) by mouth daily. 02/18/19   Strader, Fransisco Hertz, PA-C  diltiazem (CARDIZEM) 30 MG tablet Take 1 tablet (30 mg total) by mouth every 12 (twelve) hours as needed. For HR > 120 bpm 02/06/19 02/06/20  Roxan Hockey, MD  furosemide (LASIX) 20 MG tablet Take 1 tablet (20 mg total) by mouth daily. 02/07/19   Roxan Hockey, MD    levocetirizine (XYZAL) 5 MG tablet Take 5 mg by mouth every evening.    [provider]  meclizine (ANTIVERT) 25 MG tablet Take 25 mg by mouth daily as needed for dizziness.  07/11/17   [provider]  metoprolol succinate (TOPROL-XL) 50 MG 24 hr tablet Take 1 tablet (50 mg total) by mouth daily. 02/18/19 02/18/20  Strader, Fransisco Hertz, PA-C  ondansetron (ZOFRAN) 4 MG tablet Take 4 mg by mouth every 8 (eight) hours as needed for nausea or vomiting.    [provider]  pantoprazole (PROTONIX) 40 MG tablet Take 1 tablet (40 mg total) by mouth daily. 02/06/19   Roxan Hockey, MD    Inpatient Medications: Scheduled Meds:  amiodarone  200 mg Oral Daily   diltiazem  180 mg Oral Daily   furosemide  20 mg Intravenous Q12H   heparin  5,000 Units Subcutaneous Q8H   metoprolol succinate  62.5 mg Oral Daily   pantoprazole  40 mg Oral Daily   sodium chloride flush  3 mL Intravenous Q12H   Continuous Infusions:  sodium chloride     PRN Meds: sodium chloride, acetaminophen **OR** acetaminophen, ALPRAZolam, dicyclomine, ondansetron (ZOFRAN) IV, prochlorperazine, sodium chloride flush  Allergies:    Allergies  Allergen Reactions   Clonazepam Other (See Comments)    Pruritis   Codeine Nausea And Vomiting and Other (See Comments)    Reaction unknown   Sulfa Antibiotics Other (See Comments)    Reactions unknown    Social History:   Social History   Socioeconomic History   Marital status: Widowed    Spouse name: Not on file   Number of children: Not on file   Years of education: Not on file   Highest education level: Not on file  Occupational History   Not on file  Social Needs   Financial resource strain: Not on file   Food insecurity    Worry: Not on file    Inability: Not on file   Transportation needs    Medical: Not on file    Non-medical: Not on file  Tobacco Use   Smoking status: Never Smoker   Smokeless tobacco: Never Used   Substance and Sexual Activity   Alcohol use: No    Alcohol/week: 0.0 standard drinks   Drug use: No   Sexual activity: Never  Lifestyle   Physical activity    Days per week: Not on file    Minutes per session: Not on file   Stress: Not on file  Relationships   Social connections    Talks on phone: Not on file    Gets together: Not on file    Attends religious service: Not on file    Active member of club or organization: Not on file    Attends meetings of clubs or organizations: Not on file    Relationship status: Not on file   Intimate partner violence    Fear of current or ex partner: Not on file    Emotionally abused: Not on file    Physically abused: Not on file    Forced sexual activity: Not on file  Other Topics Concern   Not on file  Social History Narrative   Not on file     Family History:    Family History  Problem Relation Age of Onset   Heart attack Father       Review of Systems    General:  No chills, fever, night sweats or weight changes.  Cardiovascular:  No chest pain, edema, palpitations. Positive for dyspnea, PND and orthopnea.  Dermatological: No rash, lesions/masses Respiratory: No cough, dyspnea Urologic: No hematuria, dysuria Abdominal:   No nausea, vomiting, diarrhea, bright red blood per rectum, melena, or hematemesis Neurologic:  No visual changes, wkns, changes in mental status. All other systems reviewed and are otherwise negative except as noted above.  Physical Exam/Data    Vitals:   03/02/19 0500 03/02/19 0600 03/02/19 0630 03/02/19 0751  BP: 118/87 125/87 104/82 (!) 134/95  Pulse: (!) 118 90 (!) 105 86  Resp: (!) 23 20 19 16   Temp:    (!) 97.5 F (36.4 C)  TempSrc:    Oral  SpO2: 98% 93% 95% 95%  Weight:    51.5 kg  Height:    5\' 4"  (1.626 m)  No intake or output data in the 24 hours ending 03/02/19 1000 Filed Weights   03/02/19 0026 03/02/19 0751  Weight: 50.8 kg 51.5 kg   Body mass index is 19.49 kg/m.    General: Pleasant thin female appearing in NAD Psych: Normal affect. Neuro: Alert and oriented X 3. Moves all extremities spontaneously. HEENT: Normal  Neck: Supple without bruits. JVD at 8cm. Lungs:  Resp regular and unlabored, mild rales along bases bilaterally. Heart: Irregularly irregular.  no s3, s4, or murmurs. Abdomen: Soft, non-tender, non-distended, BS + x 4.  Extremities: No clubbing, cyanosis or edema. DP/PT/Radials 2+ and equal bilaterally.   EKG:  The EKG was personally reviewed and demonstrates: Atrial fibrillation, HR 113 with known RBBB and LAFB.   Telemetry:  Telemetry was personally reviewed and demonstrates: Atrial fibrillation, HR in 120's to 130's initially, now in 80's to 90's.    Labs/Studies     Relevant CV Studies:  Echocardiogram: 01/28/2019 IMPRESSIONS    1. Left ventricular ejection fraction, by visual estimation, is 60 to 65%. The left ventricle has normal function. There is no left ventricular hypertrophy.  2. Elevated left atrial pressure.  3. Left ventricular diastolic parameters are consistent with Grade II diastolic dysfunction (pseudonormalization).  4. Global right ventricle has normal systolic function.The right ventricular size is normal. No increase in right ventricular wall thickness.  5. Left atrial size was severely dilated.  6. Right atrial size was mildly dilated.  7. The mitral valve is abnormal. Moderate mitral valve regurgitation. No evidence of mitral stenosis.  8. The MR vena contracta is 0.6 cm. The MV/AV TVI ratio is 1.1. Findings support moderate mitral regurgitation.  9. The tricuspid valve is normal in structure. Tricuspid valve regurgitation is mild. 10. The aortic valve is tricuspid. Aortic valve regurgitation is not visualized. No evidence of aortic valve sclerosis or stenosis. 11. The pulmonic valve was not well visualized. Pulmonic valve regurgitation is mild. 12. Moderately elevated pulmonary artery systolic  pressure. 13. The inferior vena cava is normal in size with greater than 50% respiratory variability, suggesting right atrial pressure of 3 mmHg.  Laboratory Data:   Chemistry Recent Labs  Lab 03/02/19 0124  NA 138  K 4.8  CL 103  CO2 27  GLUCOSE 116*  BUN 21  CREATININE 1.58*  CALCIUM 8.7*  GFRNONAA 29*  GFRAA 34*  ANIONGAP 8    Recent Labs  Lab 03/02/19 0124  PROT 7.0  ALBUMIN 3.4*  AST 11*  ALT 9  ALKPHOS 70  BILITOT 0.2*   Hematology Recent Labs  Lab 03/02/19 0124  WBC 8.5  RBC 3.55*  HGB 10.2*  HCT 34.0*  MCV 95.8  MCH 28.7  MCHC 30.0  RDW 14.1  PLT 277   Cardiac EnzymesNo results for input(s): TROPONINI in the last 168 hours. No results for input(s): TROPIPOC in the last 168 hours.  BNP Recent Labs  Lab 03/02/19 0124  BNP 370.0*    DDimer No results for input(s): DDIMER in the last 168 hours.  Radiology/Studies:  Xr Chest Portable  Result Date: 03/02/2019 CLINICAL DATA:  AFib, shortness of breath EXAM: PORTABLE CHEST 1 VIEW COMPARISON:  January 31, 2019 FINDINGS: There is cardiomegaly. Increased interstitial markings are now seen throughout both lungs. There is blunting of the bilateral costophrenic angles which could be due to trace bilateral pleural effusions. No acute osseous abnormality. IMPRESSION: Cardiomegaly and interstitial edema Probable trace bilateral pleural effusions Electronically Signed   By: Ebony Cargo.D.  On: 03/02/2019 03:31     Assessment & Plan    1. Acute Diastolic CHF Exacerbation - Occurring in the setting of atrial fibrillation with RVR.  She denies any specific dyspnea on exertion or edema leading up to admission and weight has actually declined from 114 lbs at the time of her office visit on 02/18/2019 to 113 lbs today.  - She initially received IVF while in the ED but these were discontinued and she received IV Lasix 20 mg. BNP was elevated at 370 and CXR showed cardiomegaly and interstitial edema with trace  pleural effusions. She has been started on IV Lasix 20mg  BID. Monitor I&O's along with daily weights. Was on Lasix 20mg  PTA. May need to titrate to 40mg  daily at the time of discharge with close monitoring of her renal function. Sodium and fluid restriction reviewed.   2. Atrial Fibrillation with RVR - Heart rate initially reported in the 130's to 140's but has improved into the 80's to 90's without administration of AV nodal blocking agents. She has been restarted on her PTA regimen of Cardizem CD 180 mg daily and Toprol-XL which was titrated from 50 mg daily to 62.5 mg daily by the admitting team. Patient reports her daughter manages her medications and puts them in a pill box and she is unsure of the name or dosing of her medications. She is also on Amiodarone 200 mg daily but TSH is elevated to 8.065 this admission. Will check free T4.  May need to consider discontinuation of Amiodarone given TSH was previously normal in 01/2019.  - she has not been on anticoagulation given her history of GI bleed and chronic anemia. Risks and benefits of anticoagulation were reviewed with the patient and her daughter at the time of her office visit and they are aware of the increased thromboembolic risk.  3. Chronic Anemia - she denies any evidence of active bleeding. Hgb stable at 10.2.  4. Elevated TSH - at 8.065 this admission. Will check Free T4.   5. Stage 3 CKD - baseline creatinine 1.2 - 1.3. Elevated to 1.58 on admission. Follow closely with diuresis.     For questions or updates, please contact [place] Please consult [email or web address] for contact info under Cardiology/STEMI.  Signed, Erma Heritage, PA-C 03/02/2019, 10:00 AM Pager: [phone]   Patient seen and discussed with PA Ahmed Prima, I agree with her documentation. 85 yo female history of chronic diastolic HF, HTN, chronic anemia not on anticoag for afib, admitted with fatigue. Found to be in afib with RVR. From ER note hypoxic on  room air to 84%, given IV lasix    ER vitals p 102 131/69 100% 2L [place] WBC 8.5 Hgb 10.2 Plt 277 K 4.8 Cr 1.58 BNP 370 TSH 8 Mg 2  hstrop 5-->5 COVID neg EKG afib, RBBB, rate 113 CXR cardiomegaly with interstitial edema 12/2018 echo LVEF 60-65%, grade II diastolic dysfunction, mod MR  Afib with RVR on admission. Has been on amio 200, dilt 180, toprol 50 at home (amio started for additional rate control with less bp effects, not attempt to restore SR). Toprol increased to 62.5mg  during this admission, would go ahead and increase to 75mg  daily, follow bp's.Has not been on anticoag due to history of chronic anemia and prior GI bleed.   Acute on chronic diastolic HF likely exacerbated by afib with RVR. Given IV lasix in ER, no I/Os data.Continue IV diuretics today.     Carlyle Dolly MD

## 2019-03-02 NOTE — ED Notes (Signed)
Pt given PO fluids.

## 2019-03-02 NOTE — TOC Initial Note (Addendum)
Transition of Care [facility]) - Initial/Assessment Note    Patient Details  Name: Renee Blake MRN: [id number] Date of Birth: 19-Jun-1928  Transition of Care [facility]) CM/SW Contact:    Wylee Dorantes, Chauncey Reading, RN Phone Number: 03/02/2019, 1:22 PM  Clinical Narrative:  Admit with CHF. From home with dtr. Has BSC and RW pta.  Active with [facility] for PT. Will resume services.  Anticipate DC home with resumption of home health services.               Expected Discharge Plan: [place] Barriers to Discharge: Continued Medical Work up     Expected Discharge Plan and Services Expected Discharge Plan: [place]   Discharge Planning Services: CM Consult Post Acute Care Choice: [place], Resumption of Svcs/PTA Provider                               [facility] Agency: [place] ([place]) Date [facility] Agency Contacted: 03/02/19 Time HH Agency Contacted: 1322 Representative spoke with at [place]: [place] Arrangements/Services   Lives with:: Adult Children          Need for Family Participation in Patient Care: Yes (Comment) Care giver support system in place?: Yes (comment)     Activities of Daily Living Home Assistive Devices/Equipment: Environmental consultant (specify type) ADL Screening (condition at time of admission) Patient's cognitive ability adequate to safely complete daily activities?: Yes Is the patient deaf or have difficulty hearing?: No Does the patient have difficulty seeing, even when wearing glasses/contacts?: No Does the patient have difficulty concentrating, remembering, or making decisions?: No Patient able to express need for assistance with ADLs?: Yes Does the patient have difficulty dressing or bathing?: No Independently performs ADLs?: No Communication: Independent Dressing (OT): Appropriate for developmental age Grooming: Appropriate for developmental age Feeding: Appropriate for developmental  age Bathing: Needs assistance Is this a change from baseline?: Pre-admission baseline Toileting: Needs assistance Is this a change from baseline?: Pre-admission baseline In/Out Bed: Needs assistance Is this a change from baseline?: Pre-admission baseline Walks in Home: Needs assistance Is this a change from baseline?: Pre-admission baseline Does the patient have difficulty walking or climbing stairs?: Yes Weakness of Legs: Both Weakness of Arms/Hands: None         Admission diagnosis:  Atrial fibrillation with rapid ventricular response (HCC) [I48.91] AKI (acute kidney injury) ([place]) [N17.9] Acute on chronic combined systolic and diastolic CHF (congestive heart failure) ([place]) [I50.43] CRI (chronic renal insufficiency), stage 3 (moderate) [N18.30] Patient Active Problem List   Diagnosis Date Noted  . Acute on chronic diastolic congestive heart failure ([place]) 03/02/2019  . Acute on chronic diastolic HF (heart failure) ([place]) 03/02/2019  . Acute exacerbation of CHF (congestive heart failure) ([place]) 01/27/2019  . New onset a-fib with RVR 01/27/2019  . Iron deficiency anemia due to chronic blood loss---H/o Gi Bleed 01/27/2019  . Vaginal mass 07/29/2017  . Heme positive stool 07/25/2017  . Cellulitis 12/10/2016  . Facial cellulitis   . Erroneous encounter - disregard 03/17/2015  . AKI (acute kidney injury) ([place])   . Metabolic acidosis 0000000  . Acute renal failure syndrome ([place])   . Renal failure 08/22/2014  . Hyperkalemia 08/22/2014  . UTI (urinary tract infection) 08/22/2014  . Acute renal failure ([place]) 08/22/2014  . Dyspnea 03/15/2014  . Diabetes mellitus ([place]) 06/08/2013  . Acute diastolic congestive heart failure ([place]) 06/07/2013  .  Syncope 03/26/2013  . Rectal bleeding 03/26/2013  . Dehydration 03/26/2013  . Generalized weakness 03/26/2013  . Malnutrition of moderate degree ([place]) 03/01/2013  . Diarrhea 02/28/2013  . Loss of weight 02/28/2013  . H/o Prior duodenal  ulcer with hemorrhage 02/27/2013  . H/o Prior Lower GI bleed (anorectal Ca) 02/26/2013  . Acute blood loss anemia 02/26/2013  . UTI (lower urinary tract infection) 02/26/2013  . Hypertension 02/26/2013   PCP:  [name], MD Pharmacy:   [facility], [address] [place] [address] Phone: [phone] Fax: [phone]     Social Determinants of Health (SDOH) Interventions    Readmission Risk Interventions Readmission Risk Prevention Plan 03/02/2019 02/06/2019  Transportation Screening Complete Complete  PCP or Specialist Appt within 5-7 Days - Complete  PCP or Specialist Appt within 3-5 Days Complete -  Home Care Screening - Complete  Medication Review (RN CM) - Complete  HRI or Home Care Consult Complete -  Social Work Consult for Recovery Care Planning/Counseling Complete -  Palliative Care Screening Complete -  Medication Review (RN Care Manager) Complete -  Some recent data might be hidden

## 2019-03-03 LAB — BASIC METABOLIC PANEL
Anion gap: 11 (ref 5–15)
BUN: 16 mg/dL (ref 8–23)
CO2: 25 mmol/L (ref 22–32)
Calcium: 8.9 mg/dL (ref 8.9–10.3)
Chloride: 103 mmol/L (ref 98–111)
Creatinine, Ser: 1.21 mg/dL — ABNORMAL HIGH (ref 0.44–1.00)
GFR calc Af Amer: 46 mL/min — ABNORMAL LOW (ref >60)
GFR calc non Af Amer: 40 mL/min — ABNORMAL LOW (ref >60)
Glucose, Bld: 90 mg/dL (ref 70–99)
Potassium: 4 mmol/L (ref 3.5–5.1)
Sodium: 139 mmol/L (ref 135–145)

## 2019-03-03 MED ORDER — FUROSEMIDE 20 MG PO TABS: 20.0000 mg | ORAL_TABLET | Freq: Every day | ORAL | Status: DC

## 2019-03-03 MED ORDER — METOPROLOL SUCCINATE ER 25 MG PO TB24: 75.0000 mg | ORAL_TABLET | Freq: Every day | ORAL | 1 refills | Status: DC

## 2019-03-03 NOTE — Progress Notes (Signed)
Pt's O2 sat 97% on room air with a heart rate of 97. Pt ambulated in hall and O2 sats went up to 99-100% on room air. The highest heart rate was 107 but stayed in the high 90's most of the time. This was 1 hour post medication administration.  Deirdre Pippins, RN

## 2019-03-03 NOTE — Plan of Care (Signed)
  Problem: Education: Goal: Knowledge of General Education information will improve Description: Including pain rating scale, medication(s)/side effects and non-pharmacologic comfort measures 03/03/2019 1109 by Melony Overly, RN Outcome: Adequate for Discharge 03/03/2019 0847 by Melony Overly, RN Outcome: Progressing   Problem: Health Behavior/Discharge Planning: Goal: Ability to manage health-related needs will improve 03/03/2019 1109 by Melony Overly, RN Outcome: Adequate for Discharge 03/03/2019 0847 by Melony Overly, RN Outcome: Progressing   Problem: Clinical Measurements: Goal: Ability to maintain clinical measurements within normal limits will improve 03/03/2019 1109 by Melony Overly, RN Outcome: Adequate for Discharge 03/03/2019 0847 by Melony Overly, RN Outcome: Progressing Goal: Will remain free from infection 03/03/2019 1109 by Melony Overly, RN Outcome: Adequate for Discharge 03/03/2019 0847 by Melony Overly, RN Outcome: Progressing Goal: Diagnostic test results will improve 03/03/2019 1109 by Melony Overly, RN Outcome: Adequate for Discharge 03/03/2019 0847 by Melony Overly, RN Outcome: Progressing Goal: Respiratory complications will improve 03/03/2019 1109 by Melony Overly, RN Outcome: Adequate for Discharge 03/03/2019 0847 by Melony Overly, RN Outcome: Progressing Goal: Cardiovascular complication will be avoided 03/03/2019 1109 by Melony Overly, RN Outcome: Adequate for Discharge 03/03/2019 0847 by Melony Overly, RN Outcome: Progressing   Problem: Activity: Goal: Risk for activity intolerance will decrease 03/03/2019 1109 by Melony Overly, RN Outcome: Adequate for Discharge 03/03/2019 0847 by Melony Overly, RN Outcome: Progressing   Problem: Nutrition: Goal: Adequate nutrition will be maintained 03/03/2019 1109 by Melony Overly, RN Outcome: Adequate for Discharge 03/03/2019 0847 by Melony Overly, RN Outcome: Progressing    Problem: Coping: Goal: Level of anxiety will decrease 03/03/2019 1109 by Melony Overly, RN Outcome: Adequate for Discharge 03/03/2019 0847 by Melony Overly, RN Outcome: Progressing   Problem: Elimination: Goal: Will not experience complications related to bowel motility 03/03/2019 1109 by Melony Overly, RN Outcome: Adequate for Discharge 03/03/2019 0847 by Melony Overly, RN Outcome: Progressing Goal: Will not experience complications related to urinary retention 03/03/2019 1109 by Melony Overly, RN Outcome: Adequate for Discharge 03/03/2019 0847 by Melony Overly, RN Outcome: Progressing   Problem: Safety: Goal: Ability to remain free from injury will improve 03/03/2019 1109 by Melony Overly, RN Outcome: Adequate for Discharge 03/03/2019 0847 by Melony Overly, RN Outcome: Progressing   Problem: Pain Managment: Goal: General experience of comfort will improve 03/03/2019 1109 by Melony Overly, RN Outcome: Adequate for Discharge 03/03/2019 0847 by Melony Overly, RN Outcome: Progressing   Problem: Skin Integrity: Goal: Risk for impaired skin integrity will decrease 03/03/2019 1109 by Melony Overly, RN Outcome: Adequate for Discharge 03/03/2019 0847 by Melony Overly, RN Outcome: Progressing

## 2019-03-03 NOTE — Discharge Summary (Signed)
Physician Discharge Summary  [name] [id number] DOB: [birth year] DOA: 03/02/2019  PCP: [name], MD  Admit date: 03/02/2019  Discharge date: 03/03/2019  Admitted From:Home  Disposition:  Home  Recommendations for Outpatient Follow-up:  1. Follow up with PCP in 1-2 weeks 2. Follow-up with cardiology as scheduled on 03/17/2019 3. Discharge weight 113 pounds and creatinine 1.21 4. Continue on Lasix 20 mg daily as previously as prescribed and take extra dose of 20 mg if there is a 2 to 3 pound weight increase in 24-hour period 5. Continue on Toprol-XL, now 75 mg daily  Home Health:Yes with PT and RN  Equipment/Devices:None  Discharge Condition:Stable  CODE STATUS: DNR  Diet recommendation: Heart Healthy  Brief/Interim Summary: Per HPI: Renee Blake is a 85 y.o. female with past medical history significant for atrial fibrillation, chronic anemia and life-threatening GI bleed, hypertension, hyperlipidemia and GERD/gastric ulcer; who presented to the hospital secondary to palpitations, heart racing sensation and shortness of breath.  EMS was called and on arrival to patient's home was found to be in A. fib with RVR heart rate in the 140s and hypoxic.  Patient denies chest pain, no nausea, no vomiting, no diaphoresis, no dysuria, no hematuria, no headaches, no focal weakness or any other complaints.  Prior to this event patient reports doing okay and was in her usual health status. Patient reports to be compliant with her medications and also with low-sodium diet.  In the ED patient found to be hypoxic at 84% on room air, elevated BNP and a chest x-ray demonstrated interstitial edema.  IV Lasix given.  Patient heart rate regulated and drifted down to control atrial fibrillation on his own.   Initial labs demonstrated WBCs of 8.5, hemoglobin 10.2, platelets 277, sodium 138, potassium 4.8 and a creatinine of 1.58 (previously 1.34 in November 2020).  TSH 8.065.  Magnesium  2.0.  Negative troponin x1.  12/2: Patient was admitted with acute on chronic diastolic CHF exacerbation and has received 2 doses of IV Lasix with return to baseline weight of 113 pounds and improvement in creatinine level of 1.2.  She has no further evidence of volume overload noted and is stable for discharge.  She is to remain on her usual Lasix dose of 20 mg daily and add an additional dose of 20 mg as needed for increase in weight over 24-hour.  As noted above.  She has no hypoxemia with ambulation noted and has no home requirements.  She has had atrial fibrillation with RVR and Toprol-XL has been increased to 75 mg daily per cardiology with significant improvement in heart rate noted.  She is currently stable for discharge and has had no other acute events noted during this brief admission.  She will resume her home health PT as well as nursing care.  Discharge Diagnoses:  Principal Problem:   Acute on chronic diastolic HF (heart failure) (HCC) Active Problems:   H/o Prior Lower GI bleed (anorectal Ca)   Atrial fibrillation with RVR (HCC)   Chronic renal failure, stage 3b   Acute respiratory failure with hypoxia ([place])  Principal discharge diagnosis: Acute hypoxemic respiratory failure secondary to acute on chronic diastolic CHF exacerbation in the setting of atrial fibrillation with RVR.  Discharge Instructions  Discharge Instructions    Diet - low sodium heart healthy   Complete by: As directed    Increase activity slowly   Complete by: As directed      Allergies as of 03/03/2019  Reactions   Clonazepam Other (See Comments)   Pruritis   Codeine Nausea And Vomiting, Other (See Comments)   Reaction unknown   Sulfa Antibiotics Other (See Comments)   Reactions unknown      Medication List    STOP taking these medications   diltiazem 30 MG tablet Commonly known as: Cardizem     TAKE these medications   acetaminophen 325 MG tablet Commonly known as: TYLENOL Take 2  tablets (650 mg total) by mouth every 6 (six) hours as needed for mild pain, fever or headache (or Fever >/= 101).   ALPRAZolam 0.25 MG tablet Commonly known as: XANAX Take 1 tablet (0.25 mg total) by mouth 2 (two) times daily as needed. for anxiety   amiodarone 200 MG tablet Commonly known as: PACERONE Take 1 tablet (200 mg total) by mouth daily.   dicyclomine 10 MG capsule Commonly known as: BENTYL Take 10 mg by mouth every 4 (four) hours as needed for spasms.   diltiazem 180 MG 24 hr capsule Commonly known as: CARDIZEM CD Take 1 capsule (180 mg total) by mouth daily.   furosemide 20 MG tablet Commonly known as: LASIX Take 1 tablet (20 mg total) by mouth daily.   levocetirizine 5 MG tablet Commonly known as: XYZAL Take 5 mg by mouth every evening.   meclizine 25 MG tablet Commonly known as: ANTIVERT Take 25 mg by mouth daily as needed for dizziness.   metoprolol succinate 25 MG 24 hr tablet Commonly known as: TOPROL-XL Take 3 tablets (75 mg total) by mouth daily. Start taking on: March 04, 2019 What changed:   medication strength  how much to take   pantoprazole 40 MG tablet Commonly known as: PROTONIX Take 1 tablet (40 mg total) by mouth daily.   Zofran 4 MG tablet Generic drug: ondansetron Take 4 mg by mouth every 8 (eight) hours as needed for nausea or vomiting.      Follow-up Information    Erma Heritage, PA-C Follow up on 03/17/2019.   Specialties: Physician Assistant, Cardiology Why: Cardiology Hospital Follow-up on 03/17/2019 at 2:00PM.  Contact information: [place] [address] [phone]        [name], MD Follow up on 03/16/2019.   Specialty: Internal Medicine Why: You are scheduled to follow up with Dr. Nevada Crane on Tuesday, December 15th at 11:40am. Contact information: [address] [facility] [place] [address] [phone]        Herminio Commons, MD .   Specialty: Cardiology Contact information: [place] [address] [phone]          Allergies  Allergen Reactions  . Clonazepam Other (See Comments)    Pruritis  . Codeine Nausea And Vomiting and Other (See Comments)    Reaction unknown  . Sulfa Antibiotics Other (See Comments)    Reactions unknown    Consultations:  Cardiology   Procedures/Studies: Xr Chest Portable  Result Date: 03/02/2019 CLINICAL DATA:  AFib, shortness of breath EXAM: PORTABLE CHEST 1 VIEW COMPARISON:  January 31, 2019 FINDINGS: There is cardiomegaly. Increased interstitial markings are now seen throughout both lungs. There is blunting of the bilateral costophrenic angles which could be due to trace bilateral pleural effusions. No acute osseous abnormality. IMPRESSION: Cardiomegaly and interstitial edema Probable trace bilateral pleural effusions Electronically Signed   By: Prudencio Pair M.D.   On: 03/02/2019 03:31     Discharge Exam: Vitals:   03/02/19 2100 03/03/19 0600  BP: 113/79 111/80  Pulse: 83 74  Resp: 20 20  Temp: 98.4 F (36.9 C) (!) 97.4 F (36.3 C)  SpO2: 95% 98%   Vitals:   03/02/19 0751 03/02/19 1646 03/02/19 2100 03/03/19 0600  BP: (!) 134/95 (!) 89/74 113/79 111/80  Pulse: 86 85 83 74  Resp: 16 19 20 20   Temp: (!) 97.5 F (36.4 C) 98.2 F (36.8 C) 98.4 F (36.9 C) (!) 97.4 F (36.3 C)  TempSrc: Oral Oral Oral Oral  SpO2: 95% 95% 95% 98%  Weight: 51.5 kg     Height: 5\' 4"  (1.626 m)       General: Pt is alert, awake, not in acute distress Cardiovascular: RRR, S1/S2 +, no rubs, no gallops Respiratory: CTA bilaterally, no wheezing, no rhonchi Abdominal: Soft, NT, ND, bowel sounds + Extremities: no edema, no cyanosis    The results of significant diagnostics from this hospitalization (including imaging, microbiology, ancillary and laboratory) are listed below for reference.     Microbiology: Recent Results (from the past 240 hour(s))  SARS CORONAVIRUS 2 (TAT 6-24 HRS) Nasopharyngeal  Nasopharyngeal Swab     Status: None   Collection Time: 03/02/19  4:12 AM   Specimen: Nasopharyngeal Swab  Result Value Ref Range Status   SARS Coronavirus 2 NEGATIVE NEGATIVE Final    Comment: (NOTE) SARS-CoV-2 target nucleic acids are NOT DETECTED. The SARS-CoV-2 RNA is generally detectable in upper and lower respiratory specimens during the acute phase of infection. Negative results do not preclude SARS-CoV-2 infection, do not rule out co-infections with other pathogens, and should not be used as the sole basis for treatment or other patient management decisions. Negative results must be combined with clinical observations, patient history, and epidemiological information. The expected result is Negative. Fact Sheet for Patients: SugarRoll.be Fact Sheet for Healthcare Providers: https://www.woods-mathews.com/ This test is not yet approved or cleared by the [place] FDA and  has been authorized for detection and/or diagnosis of SARS-CoV-2 by FDA under an Emergency Use Authorization (EUA). This EUA will remain  in effect (meaning this test can be used) for the duration of the COVID-19 declaration under Section 56 4(b)(1) of the Act, 21 U.S.C. section 360bbb-3(b)(1), unless the authorization is terminated or revoked sooner. Performed at [facility], [place] [address], [place], [place] [address]      Labs: BNP (last 3 results) Recent Labs    01/27/19 1006 03/02/19 0124  BNP 354.0* [id number]*   Basic Metabolic Panel: Recent Labs  Lab 03/02/19 0124 03/02/19 0455 03/03/19 0426  NA 138  --  139  K 4.8  --  4.0  CL 103  --  103  CO2 27  --  25  GLUCOSE 116*  --  90  BUN 21  --  16  CREATININE 1.58*  --  1.21*  CALCIUM 8.7*  --  8.9  MG  --  2.0  --    Liver Function Tests: Recent Labs  Lab 03/02/19 0124  AST 11*  ALT 9  ALKPHOS 70  BILITOT 0.2*  PROT 7.0  ALBUMIN 3.4*   No results for input(s): LIPASE, AMYLASE  in the last 168 hours. No results for input(s): AMMONIA in the last 168 hours. CBC: Recent Labs  Lab 03/02/19 0124  WBC 8.5  HGB 10.2*  HCT 34.0*  MCV 95.8  PLT 277   Cardiac Enzymes: No results for input(s): CKTOTAL, CKMB, CKMBINDEX, TROPONINI in the last 168 hours. BNP: Invalid input(s): POCBNP CBG: No results  for input(s): GLUCAP in the last 168 hours. D-Dimer No results for input(s): DDIMER in the last 72 hours. Hgb A1c No results for input(s): HGBA1C in the last 72 hours. Lipid Profile No results for input(s): CHOL, HDL, LDLCALC, TRIG, CHOLHDL, LDLDIRECT in the last 72 hours. Thyroid function studies Recent Labs    03/02/19 0124  TSH 8.065*   Anemia work up No results for input(s): VITAMINB12, FOLATE, FERRITIN, TIBC, IRON, RETICCTPCT in the last 72 hours. Urinalysis    Component Value Date/Time   COLORURINE YELLOW 01/27/2019 1533   APPEARANCEUR CLOUDY (A) 01/27/2019 1533   LABSPEC 1.005 01/27/2019 1533   PHURINE 7.0 01/27/2019 1533   GLUCOSEU NEGATIVE 01/27/2019 1533   HGBUR SMALL (A) 01/27/2019 1533   BILIRUBINUR NEGATIVE 01/27/2019 1533   KETONESUR NEGATIVE 01/27/2019 1533   PROTEINUR NEGATIVE 01/27/2019 1533   UROBILINOGEN 0.2 08/22/2014 1300   NITRITE NEGATIVE 01/27/2019 1533   LEUKOCYTESUR LARGE (A) 01/27/2019 1533   Sepsis Labs Invalid input(s): PROCALCITONIN,  WBC,  LACTICIDVEN Microbiology Recent Results (from the past 240 hour(s))  SARS CORONAVIRUS 2 (TAT 6-24 HRS) Nasopharyngeal Nasopharyngeal Swab     Status: None   Collection Time: 03/02/19  4:12 AM   Specimen: Nasopharyngeal Swab  Result Value Ref Range Status   SARS Coronavirus 2 NEGATIVE NEGATIVE Final    Comment: (NOTE) SARS-CoV-2 target nucleic acids are NOT DETECTED. The SARS-CoV-2 RNA is generally detectable in upper and lower respiratory specimens during the acute phase of infection. Negative results do not preclude SARS-CoV-2 infection, do not rule out co-infections with other  pathogens, and should not be used as the sole basis for treatment or other patient management decisions. Negative results must be combined with clinical observations, patient history, and epidemiological information. The expected result is Negative. Fact Sheet for Patients: SugarRoll.be Fact Sheet for Healthcare Providers: https://www.woods-mathews.com/ This test is not yet approved or cleared by the [place] FDA and  has been authorized for detection and/or diagnosis of SARS-CoV-2 by FDA under an Emergency Use Authorization (EUA). This EUA will remain  in effect (meaning this test can be used) for the duration of the COVID-19 declaration under Section 56 4(b)(1) of the Act, 21 U.S.C. section 360bbb-3(b)(1), unless the authorization is terminated or revoked sooner. Performed at [facility], [place] [address] [place], [place] [address]      Time coordinating discharge: 35 minutes  SIGNED:   Rodena Goldmann, DO [facility] 03/03/2019, 11:09 AM  If 7PM-7AM, please contact night-coverage [email or web address]

## 2019-03-03 NOTE — TOC Transition Note (Signed)
Transition of Care [facility]) - CM/SW Discharge Note   Patient Details  Name: Renee Blake MRN: [id number] Date of Birth: 09-03-1928  [facility] CM/SW Contact:  Kenadee Gates, Chauncey Reading, RN Phone Number: 03/03/2019, 10:50 AM   Clinical Narrative:   Patient discharging home today. Discussed with patient adding back home health RN for disease management along with her current PT service, she is agreeable. Vaughan Basta of [facility] notified. Patient reports daughter will transport home. Patient did not qualify for home oxygen.     Final next level of care: [place] Barriers to Discharge: Barriers Resolved   Patient Goals and CMS Choice Patient states their goals for this hospitalization and ongoing recovery are:: return home      Discharge Plan and Services   Discharge Planning Services: CM Consult Post Acute Care Choice: Home Health, Resumption of Svcs/PTA Provider                    HH Arranged: PT, RN, Disease Management [place] Agency: [place] ([facility]) Date HH Agency Contacted: 03/03/19 Time [place]: 1050 Representative spoke with at [place]: [place] ([place]) Interventions     Readmission Risk Interventions Readmission Risk Prevention Plan 03/02/2019 02/06/2019  Transportation Screening Complete Complete  PCP or Specialist Appt within 5-7 Days - Complete  PCP or Specialist Appt within 3-5 Days Complete -  Home Care Screening - Complete  Medication Review (RN CM) - Complete  HRI or Home Care Consult Complete -  Social Work Consult for [place] Planning/Counseling Complete -  Palliative Care Screening Complete -  Medication Review Press photographer) Complete -  Some recent data might be hidden

## 2019-03-03 NOTE — Progress Notes (Addendum)
Progress Note  Patient Name: Renee Blake Date of Encounter: 03/03/2019  Primary Cardiologist: Kate Sable, MD   Subjective   Reports her orthopnea has resolved. Slept well overnight. Denies any chest pain or palpitations.   Inpatient Medications    Scheduled Meds: . amiodarone  200 mg Oral Daily  . diltiazem  180 mg Oral Daily  . furosemide  20 mg Intravenous Q12H  . heparin  5,000 Units Subcutaneous Q8H  . metoprolol succinate  75 mg Oral Daily  . pantoprazole  40 mg Oral Daily  . sodium chloride flush  3 mL Intravenous Q12H   Continuous Infusions: . sodium chloride     PRN Meds: sodium chloride, acetaminophen **OR** acetaminophen, ALPRAZolam, dicyclomine, ondansetron (ZOFRAN) IV, prochlorperazine, sodium chloride flush   Vital Signs    Vitals:   03/02/19 0751 03/02/19 1646 03/02/19 2100 03/03/19 0600  BP: (!) 134/95 (!) 89/74 113/79 111/80  Pulse: 86 85 83 74  Resp: 16 19 20 20   Temp: (!) 97.5 F (36.4 C) 98.2 F (36.8 C) 98.4 F (36.9 C) (!) 97.4 F (36.3 C)  TempSrc: Oral Oral Oral Oral  SpO2: 95% 95% 95% 98%  Weight: 51.5 kg     Height: 5\' 4"  (1.626 m)       Intake/Output Summary (Last 24 hours) at 03/03/2019 0823 Last data filed at 03/02/2019 1210 Gross per 24 hour  Intake -  Output 250 ml  Net -250 ml    Last 3 Weights 03/02/2019 03/02/2019 02/18/2019  Weight (lbs) 113 lb 8.6 oz 112 lb 114 lb  Weight (kg) 51.5 kg 50.803 kg 51.71 kg      Telemetry    Atrial fibrillation, HR in 80's to 90's.  - Personally Reviewed  ECG    Atrial fibrillation, HR 113 with RBBB and LAFB - Personally Reviewed  Physical Exam   General: Elderly, thin female appearing in no acute distress. Head: Normocephalic, atraumatic.  Neck: Supple without bruits, JVD not elevated. Lungs:  Resp regular and unlabored, CTA without wheezing or rales. Heart: Irregularly irregular, S1, S2, no S3, S4, no rub. 2/6 holosystolic murmur along Apex.  Abdomen: Soft,  non-tender, non-distended with normoactive bowel sounds. No hepatomegaly. No rebound/guarding. No obvious abdominal masses. Extremities: No clubbing, cyanosis, or lower extremity edema. Distal pedal pulses are 2+ bilaterally. Neuro: Alert and oriented X 3. Moves all extremities spontaneously. Psych: Normal affect.  Labs    Chemistry Recent Labs  Lab 03/02/19 0124 03/03/19 0426  NA 138 139  K 4.8 4.0  CL 103 103  CO2 27 25  GLUCOSE 116* 90  BUN 21 16  CREATININE 1.58* 1.21*  CALCIUM 8.7* 8.9  PROT 7.0  --   ALBUMIN 3.4*  --   AST 11*  --   ALT 9  --   ALKPHOS 70  --   BILITOT 0.2*  --   GFRNONAA 29* 40*  GFRAA 34* 46*  ANIONGAP 8 11     Hematology Recent Labs  Lab 03/02/19 0124  WBC 8.5  RBC 3.55*  HGB 10.2*  HCT 34.0*  MCV 95.8  MCH 28.7  MCHC 30.0  RDW 14.1  PLT 277    Cardiac EnzymesNo results for input(s): TROPONINI in the last 168 hours. No results for input(s): TROPIPOC in the last 168 hours.   BNP Recent Labs  Lab 03/02/19 0124  BNP 370.0*     DDimer No results for input(s): DDIMER in the last 168 hours.   Radiology  Xr Chest Portable  Result Date: 03/02/2019 CLINICAL DATA:  AFib, shortness of breath EXAM: PORTABLE CHEST 1 VIEW COMPARISON:  January 31, 2019 FINDINGS: There is cardiomegaly. Increased interstitial markings are now seen throughout both lungs. There is blunting of the bilateral costophrenic angles which could be due to trace bilateral pleural effusions. No acute osseous abnormality. IMPRESSION: Cardiomegaly and interstitial edema Probable trace bilateral pleural effusions Electronically Signed   By: Prudencio Pair M.D.   On: 03/02/2019 03:31    Cardiac Studies   Echocardiogram: 01/28/2019 IMPRESSIONS    1. Left ventricular ejection fraction, by visual estimation, is 60 to 65%. The left ventricle has normal function. There is no left ventricular hypertrophy.  2. Elevated left atrial pressure.  3. Left ventricular diastolic  parameters are consistent with Grade II diastolic dysfunction (pseudonormalization).  4. Global right ventricle has normal systolic function.The right ventricular size is normal. No increase in right ventricular wall thickness.  5. Left atrial size was severely dilated.  6. Right atrial size was mildly dilated.  7. The mitral valve is abnormal. Moderate mitral valve regurgitation. No evidence of mitral stenosis.  8. The MR vena contracta is 0.6 cm. The MV/AV TVI ratio is 1.1. Findings support moderate mitral regurgitation.  9. The tricuspid valve is normal in structure. Tricuspid valve regurgitation is mild. 10. The aortic valve is tricuspid. Aortic valve regurgitation is not visualized. No evidence of aortic valve sclerosis or stenosis. 11. The pulmonic valve was not well visualized. Pulmonic valve regurgitation is mild. 12. Moderately elevated pulmonary artery systolic pressure. 13. The inferior vena cava is normal in size with greater than 50% respiratory variability, suggesting right atrial pressure of 3 mmHg.  Patient Profile     85 y.o. female w/ PMH of chronic diastolic CHF, HTN, GIB, history of anorectal cancer (s/p radiation treatment), Stage 3 CKD and chronic anemia who presented to the ED on 03/02/2019 for evaluation of worsening orthopnea and dyspnea, found to be in atrial fibrillation with RVR and to have an acute CHF exacerbation.   Assessment & Plan    1. Acute Diastolic CHF Exacerbation - Occurring in the setting of atrial fibrillation with RVR. BNP elevated to 370 on admission and CXR obtained after she received a fluid bolus showed evidence of volume overload. She has received two doses of IV Lasix 20mg  and reports feeling back to baseline. I&O's not fully recorded. Weight is at her baseline of 113 lbs with creatinine having improved to 1.21. - she was on Lasix 20mg  daily PTA. Will discuss dosing at the time of discharge with Dr. Domenic Polite as I am concerned in the setting of  decreased PO intake she might become dehydrated with an increased baseline dose of 40mg  daily. Might be best to continue 20mg  daily with instructions to take an extra tablet if needed for worsening edema or weight gain > 2 lbs overnight or 5 lbs in one week.   2. Atrial Fibrillation with RVR - Heart rate initially reported in the 130's to 140's while in the ED but improved into the 80's to 90's without administration of AV nodal blocking agents.  - rates have overall been well-controlled in the 80's to 90's overnight and this morning. Will check rates with ambulation later today. She is currently taking Amiodarone 200mg  daily (for rate control), Cardizem CD 180mg  daily and Toprol-XL 75mg  daily. Toprol-XL was just titrated from 62.5mg  to 75mg  with her initial dose being today so would not further titrate at this time given her  soft BP. Will need to follow TSH as an outpatient as outlined below.  - not on anticoagulation due to history of GIB and chronic anemia. Patient aware of increased thromboembolic risk.  3. Chronic Anemia - Hgb stable at 10.2 this admission. No evidence of active bleeding.   4. Elevated TSH - TSH at 8.065 this admission with Free T4 WNL. Will need to follow as an outpatient. If TSH continues to trend upwards, would need to discontinue Amiodarone as TSH was previously normal prior to initiation of this.   5. Stage 3 CKD - creatinine was elevated to 1.58 on admission, improved to 1.21 today which is at her baseline of 1.2 - 1.3.   For questions or updates, please contact [facility] Please consult [email or web address] for contact info under Cardiology/STEMI.   Arna Medici , PA-C 8:23 AM 12/2/2020PagerTB:[id number]   Attending note:  Patient seen and examined.  Case discussed with Ms. Ahmed Prima PA-C, I agree with her findings.  Ms. Johanson appears comfortable this morning, no chest pain or shortness of breath at rest.  She is afebrile, heart rate in the  70s to 90s in atrial fibrillation by telemetry which I personally reviewed.  Cardiac exam with irregularly irregular rhythm and 2/6 systolic murmur at the apex, no peripheral edema.  Lab work shows potassium 4.0, BUN 16, creatinine 1.21.  Patient clinically improved, anticipate discharge.  Continue amiodarone, Cardizem CD, and Toprol-XL for heart rate control, she is not anticoagulated with history of GI bleed.  I would agree with Lasix 20 mg daily and use of extra dose if weight increases 2 to 3 pounds in 24 hours.  Satira Sark, M.D., F.A.C.C.

## 2019-03-03 NOTE — Plan of Care (Signed)

## 2019-03-04 ENCOUNTER — Inpatient Hospital Stay (HOSPITAL_COMMUNITY): Payer: Medicare Other

## 2019-03-04 ENCOUNTER — Emergency Department (HOSPITAL_COMMUNITY): Payer: Medicare Other

## 2019-03-04 ENCOUNTER — Inpatient Hospital Stay (HOSPITAL_COMMUNITY)
Admission: EM | Admit: 2019-03-04 | Discharge: 2019-03-07 | DRG: 291 | Disposition: A | Discharge status: Home / Self Care | Payer: Medicare Other | Attending: Family Medicine | Admitting: Family Medicine

## 2019-03-04 ENCOUNTER — Encounter (HOSPITAL_COMMUNITY): Payer: Self-pay

## 2019-03-04 ENCOUNTER — Other Ambulatory Visit: Payer: Self-pay

## 2019-03-04 DIAGNOSIS — I5033 Acute on chronic diastolic (congestive) heart failure: Secondary | ICD-10-CM | POA: Diagnosis present

## 2019-03-04 DIAGNOSIS — Z20828 Contact with and (suspected) exposure to other viral communicable diseases: Secondary | ICD-10-CM | POA: Diagnosis present

## 2019-03-04 DIAGNOSIS — R0689 Other abnormalities of breathing: Secondary | ICD-10-CM

## 2019-03-04 DIAGNOSIS — K922 Gastrointestinal hemorrhage, unspecified: Secondary | ICD-10-CM | POA: Diagnosis present

## 2019-03-04 DIAGNOSIS — J9811 Atelectasis: Secondary | ICD-10-CM | POA: Diagnosis not present

## 2019-03-04 DIAGNOSIS — R0902 Hypoxemia: Secondary | ICD-10-CM | POA: Diagnosis not present

## 2019-03-04 DIAGNOSIS — N179 Acute kidney failure, unspecified: Secondary | ICD-10-CM | POA: Diagnosis present

## 2019-03-04 DIAGNOSIS — N1832 Chronic kidney disease, stage 3b: Secondary | ICD-10-CM | POA: Diagnosis not present

## 2019-03-04 DIAGNOSIS — Z9889 Other specified postprocedural states: Secondary | ICD-10-CM

## 2019-03-04 DIAGNOSIS — Z8711 Personal history of peptic ulcer disease: Secondary | ICD-10-CM

## 2019-03-04 DIAGNOSIS — J811 Chronic pulmonary edema: Secondary | ICD-10-CM | POA: Diagnosis not present

## 2019-03-04 DIAGNOSIS — J9601 Acute respiratory failure with hypoxia: Secondary | ICD-10-CM | POA: Diagnosis present

## 2019-03-04 DIAGNOSIS — I517 Cardiomegaly: Secondary | ICD-10-CM | POA: Diagnosis not present

## 2019-03-04 DIAGNOSIS — Z8249 Family history of ischemic heart disease and other diseases of the circulatory system: Secondary | ICD-10-CM | POA: Diagnosis not present

## 2019-03-04 DIAGNOSIS — R069 Unspecified abnormalities of breathing: Secondary | ICD-10-CM | POA: Diagnosis not present

## 2019-03-04 DIAGNOSIS — R531 Weakness: Secondary | ICD-10-CM

## 2019-03-04 DIAGNOSIS — Z923 Personal history of irradiation: Secondary | ICD-10-CM | POA: Diagnosis not present

## 2019-03-04 DIAGNOSIS — I130 Hypertensive heart and chronic kidney disease with heart failure and stage 1 through stage 4 chronic kidney disease, or unspecified chronic kidney disease: Principal | ICD-10-CM | POA: Diagnosis present

## 2019-03-04 DIAGNOSIS — J90 Pleural effusion, not elsewhere classified: Secondary | ICD-10-CM | POA: Diagnosis not present

## 2019-03-04 DIAGNOSIS — R0602 Shortness of breath: Secondary | ICD-10-CM | POA: Diagnosis not present

## 2019-03-04 DIAGNOSIS — Z85048 Personal history of other malignant neoplasm of rectum, rectosigmoid junction, and anus: Secondary | ICD-10-CM

## 2019-03-04 DIAGNOSIS — E440 Moderate protein-calorie malnutrition: Secondary | ICD-10-CM

## 2019-03-04 DIAGNOSIS — R7989 Other specified abnormal findings of blood chemistry: Secondary | ICD-10-CM

## 2019-03-04 DIAGNOSIS — R52 Pain, unspecified: Secondary | ICD-10-CM | POA: Diagnosis not present

## 2019-03-04 DIAGNOSIS — Z79899 Other long term (current) drug therapy: Secondary | ICD-10-CM

## 2019-03-04 DIAGNOSIS — Z743 Need for continuous supervision: Secondary | ICD-10-CM | POA: Diagnosis not present

## 2019-03-04 DIAGNOSIS — D649 Anemia, unspecified: Secondary | ICD-10-CM

## 2019-03-04 DIAGNOSIS — I482 Chronic atrial fibrillation, unspecified: Secondary | ICD-10-CM | POA: Diagnosis present

## 2019-03-04 DIAGNOSIS — I499 Cardiac arrhythmia, unspecified: Secondary | ICD-10-CM | POA: Diagnosis not present

## 2019-03-04 DIAGNOSIS — R0600 Dyspnea, unspecified: Secondary | ICD-10-CM | POA: Diagnosis not present

## 2019-03-04 DIAGNOSIS — I4891 Unspecified atrial fibrillation: Secondary | ICD-10-CM | POA: Diagnosis not present

## 2019-03-04 DIAGNOSIS — Z66 Do not resuscitate: Secondary | ICD-10-CM | POA: Diagnosis not present

## 2019-03-04 LAB — CBC WITH DIFFERENTIAL/PLATELET
Abs Immature Granulocytes: 0.02 10*3/uL (ref 0.00–0.07)
Basophils Absolute: 0 10*3/uL (ref 0.0–0.1)
Basophils Relative: 0 %
Eosinophils Absolute: 0.1 10*3/uL (ref 0.0–0.5)
Eosinophils Relative: 2 %
HCT: 35.7 % — ABNORMAL LOW (ref 36.0–46.0)
Hemoglobin: 10.6 g/dL — ABNORMAL LOW (ref 12.0–15.0)
Immature Granulocytes: 0 %
Lymphocytes Relative: 18 %
Lymphs Abs: 1.3 10*3/uL (ref 0.7–4.0)
MCH: 27.7 pg (ref 26.0–34.0)
MCHC: 29.7 g/dL — ABNORMAL LOW (ref 30.0–36.0)
MCV: 93.5 fL (ref 80.0–100.0)
Monocytes Absolute: 0.7 10*3/uL (ref 0.1–1.0)
Monocytes Relative: 9 %
Neutro Abs: 5 10*3/uL (ref 1.7–7.7)
Neutrophils Relative %: 71 %
Platelets: 267 10*3/uL (ref 150–400)
RBC: 3.82 MIL/uL — ABNORMAL LOW (ref 3.87–5.11)
RDW: 14.2 % (ref 11.5–15.5)
WBC: 7.1 10*3/uL (ref 4.0–10.5)
nRBC: 0 % (ref 0.0–0.2)

## 2019-03-04 LAB — BASIC METABOLIC PANEL
Anion gap: 13 (ref 5–15)
BUN: 25 mg/dL — ABNORMAL HIGH (ref 8–23)
CO2: 25 mmol/L (ref 22–32)
Calcium: 9.4 mg/dL (ref 8.9–10.3)
Chloride: 101 mmol/L (ref 98–111)
Creatinine, Ser: 1.82 mg/dL — ABNORMAL HIGH (ref 0.44–1.00)
GFR calc Af Amer: 28 mL/min — ABNORMAL LOW (ref >60)
GFR calc non Af Amer: 24 mL/min — ABNORMAL LOW (ref >60)
Glucose, Bld: 120 mg/dL — ABNORMAL HIGH (ref 70–99)
Potassium: 5 mmol/L (ref 3.5–5.1)
Sodium: 139 mmol/L (ref 135–145)

## 2019-03-04 LAB — TROPONIN I (HIGH SENSITIVITY)
Troponin I (High Sensitivity): 5 ng/L (ref <18)
Troponin I (High Sensitivity): 5 ng/L (ref <18)

## 2019-03-04 LAB — BRAIN NATRIURETIC PEPTIDE: B Natriuretic Peptide: 412 pg/mL — ABNORMAL HIGH (ref 0.0–100.0)

## 2019-03-04 LAB — BODY FLUID CELL COUNT WITH DIFFERENTIAL
Eos, Fluid: 0 %
Lymphs, Fluid: 69 %
Monocyte-Macrophage-Serous Fluid: 13 % — ABNORMAL LOW (ref 50–90)
Neutrophil Count, Fluid: 18 % (ref 0–25)
Other Cells, Fluid: 1 %
Total Nucleated Cell Count, Fluid: 901 cu mm (ref 0–1000)

## 2019-03-04 LAB — GRAM STAIN: Gram Stain: NONE SEEN

## 2019-03-04 LAB — SARS CORONAVIRUS 2 (TAT 6-24 HRS): SARS Coronavirus 2: NEGATIVE

## 2019-03-04 LAB — PROTEIN, PLEURAL OR PERITONEAL FLUID: Total protein, fluid: <3 g/dL

## 2019-03-04 LAB — GLUCOSE, PLEURAL OR PERITONEAL FLUID: Glucose, Fluid: 135 mg/dL

## 2019-03-04 IMAGING — DX DG CHEST 1V
1 series · 1 of 1 positions shown · non-contrast
Comparison: [DATE]

CLINICAL DATA: LEFT pleural effusion post thoracentesis

EXAM:
CHEST  1 VIEW

[chest pa]
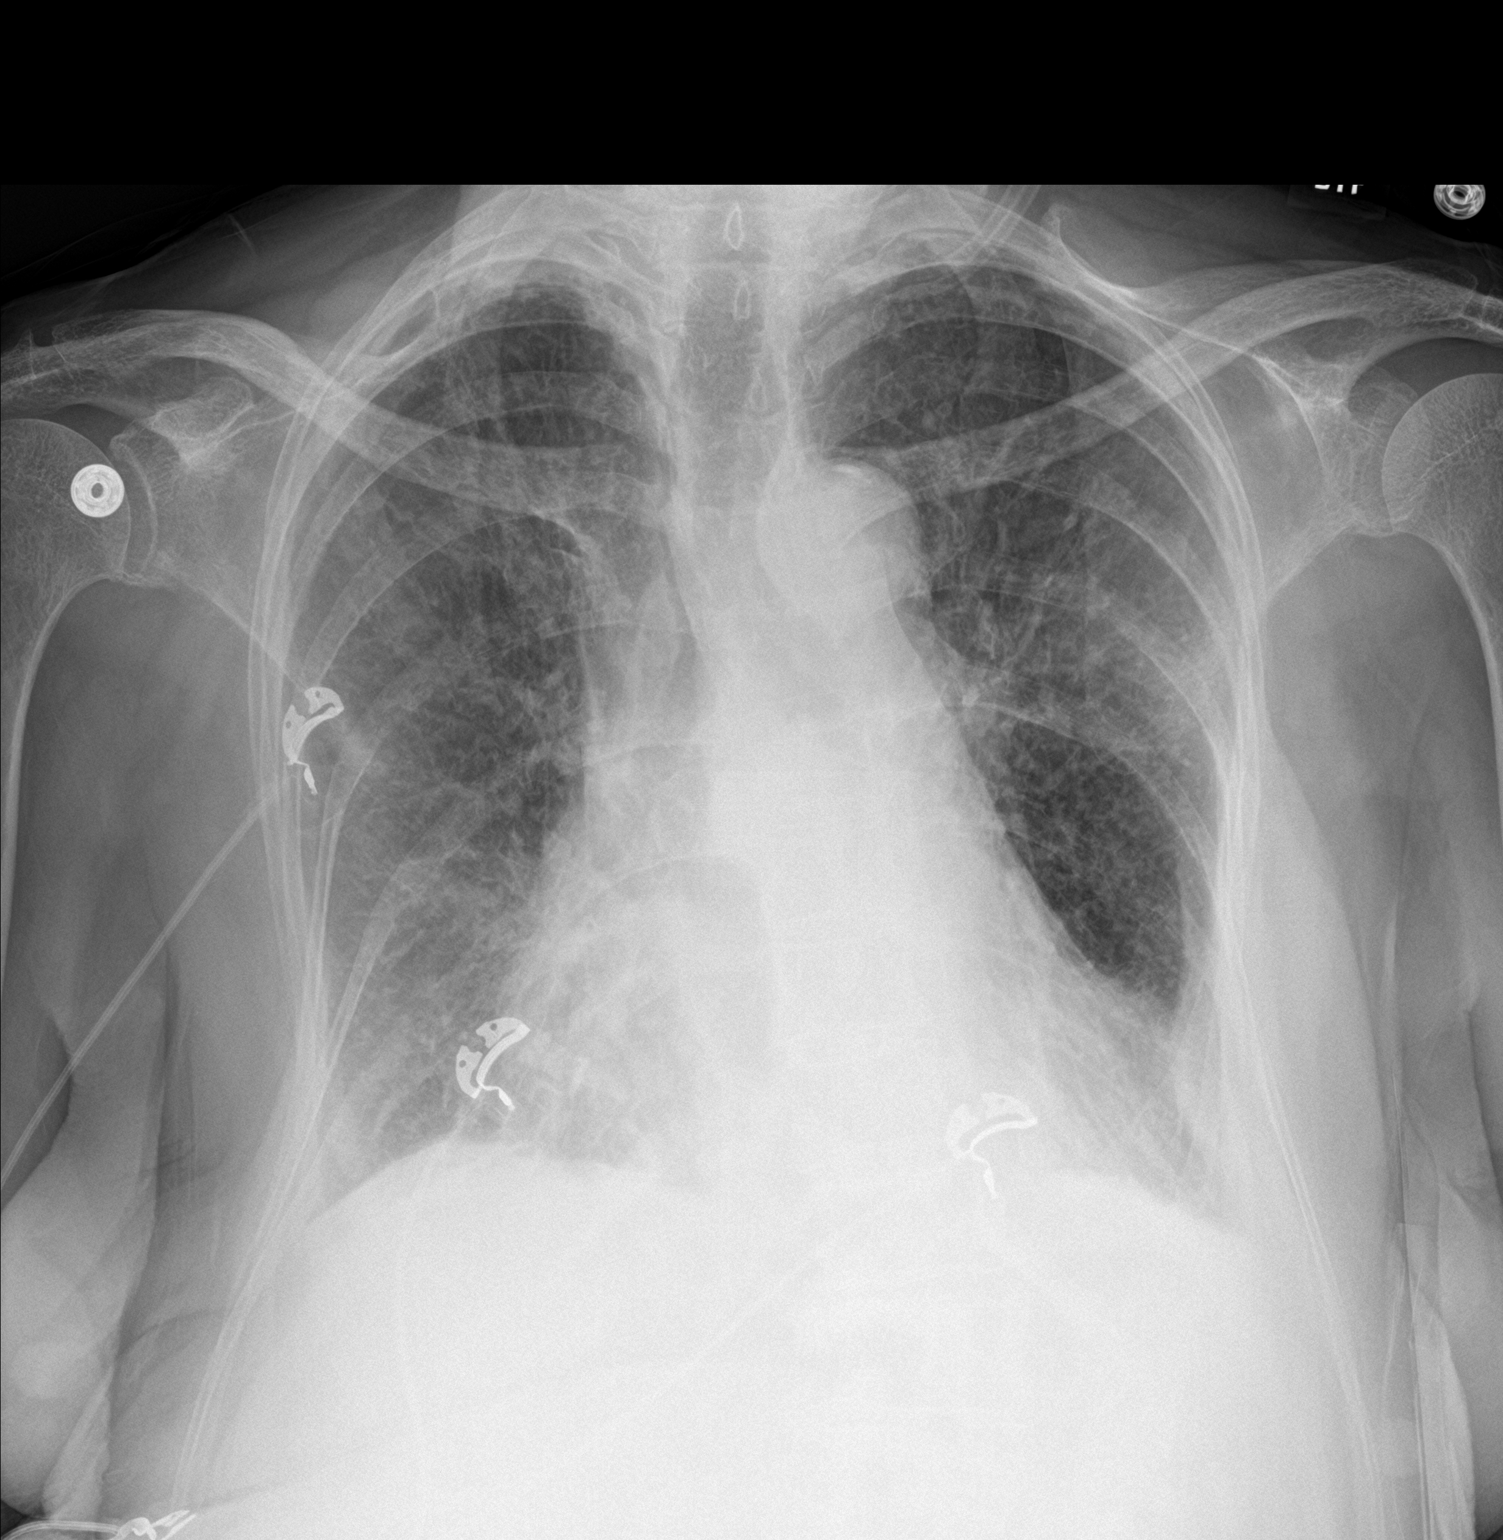

[1 of 1 positions shown; findings below may reference images not displayed]

FINDINGS: Enlargement of cardiac silhouette with pulmonary vascular
congestion.

Scattered interstitial infiltrates favor pulmonary edema.

Small loculated pleural effusion at lateral LEFT lung base decreased
from prior study.

No pneumothorax following thoracentesis.

Atherosclerotic calcification aorta.

Bones demineralized.
IMPRESSION: No pneumothorax following LEFT thoracentesis.

Enlargement of cardiac silhouette with vascular congestion and mild
pulmonary edema, slightly improved from previous study.

## 2019-03-04 IMAGING — CT CT ANGIO CHEST
2 of 6 series · 18 of 46 positions shown · IV contrast (Omnipaque or Isovue)
Comparison: One-view chest x-ray [DATE]. CTA chest [DATE]

CLINICAL DATA: PE suspected, high pretest probability. Shortness of
breath. Atrial fibrillation. Patient discharged from [REDACTED] yesterday for treatment of congestive heart failure.

EXAM:
CT ANGIOGRAPHY CHEST WITH CONTRAST
TECHNIQUE: Multidetector CT imaging of the chest was performed using the
standard protocol during bolus administration of intravenous
contrast. Multiplanar CT image reconstructions and MIPs were
obtained to evaluate the vascular anatomy.
CONTRAST:  100mL OMNIPAQUE IOHEXOL 350 MG/ML SOLN

[Series 5: pe axial thins · axial · 0.56mm/px · z∈[-227,+21]mm · 15 of 272 slices shown]
[im 12/272  lung]
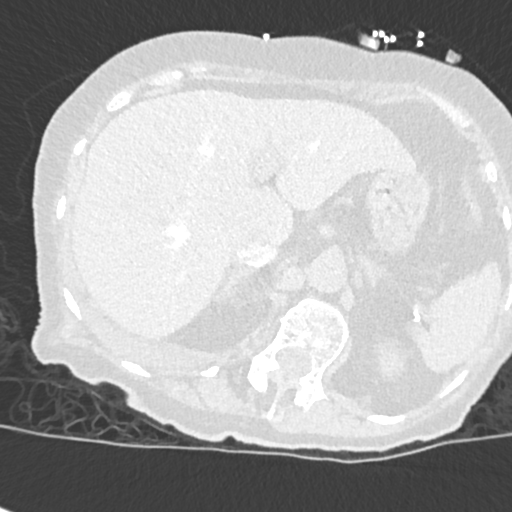
[im 36/272  soft-tissue]
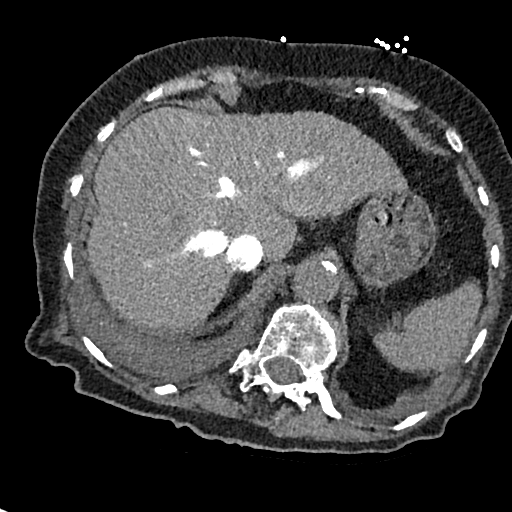
[im 48/272  lung]
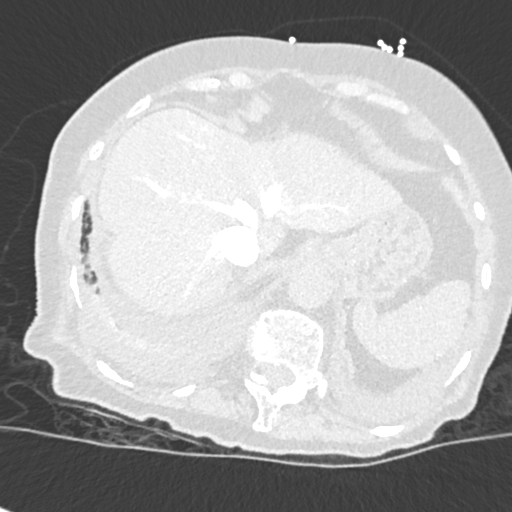
[im 71/272  soft-tissue]
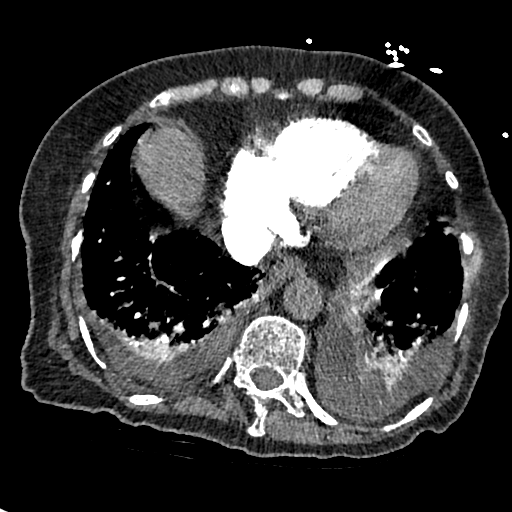
[im 83/272  lung]
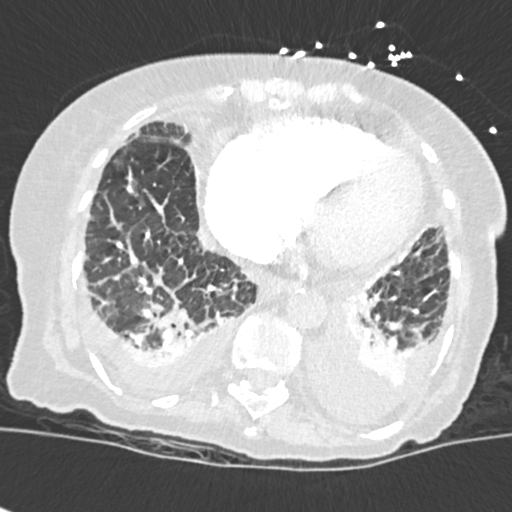
[im 107/272  soft-tissue]
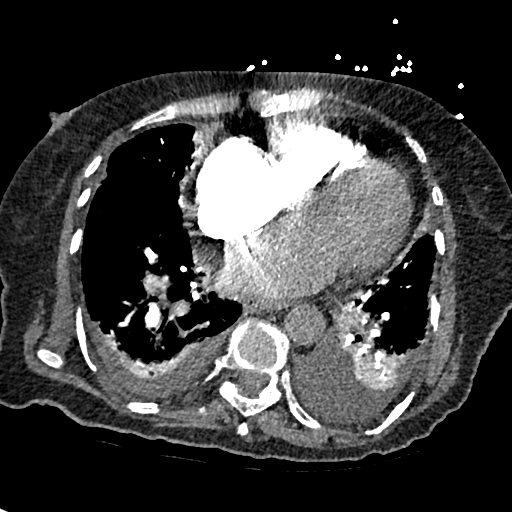
[im 118/272  lung]
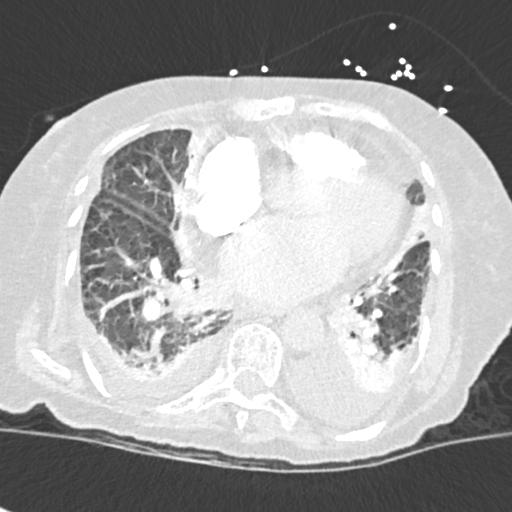
[im 142/272  soft-tissue]
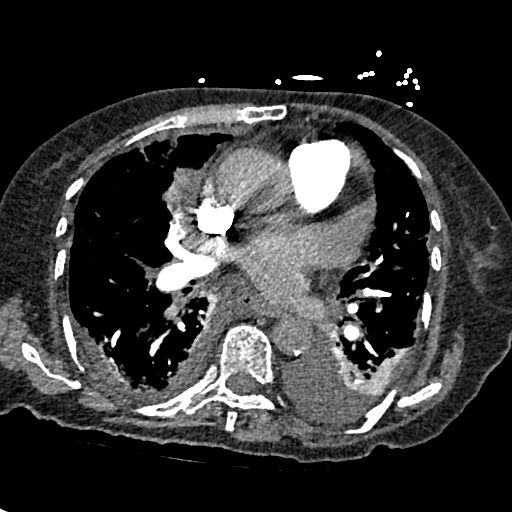
[im 154/272  lung]
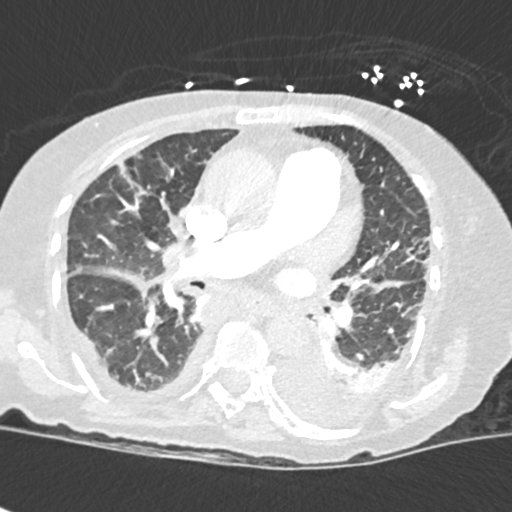
[im 165/272  soft-tissue]
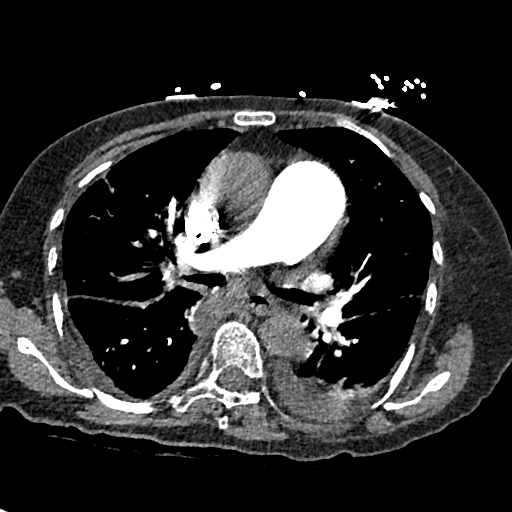
[im 189/272  lung]
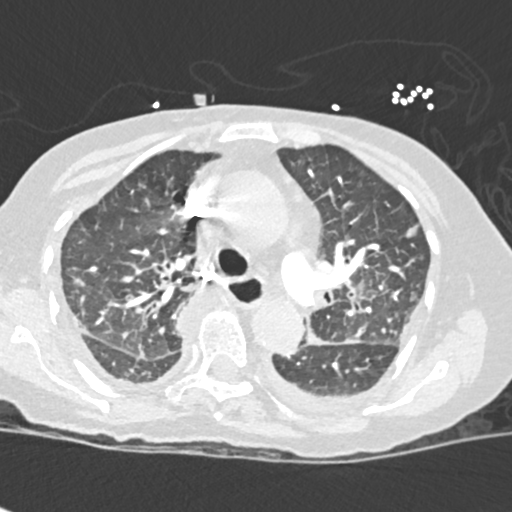
[im 201/272  soft-tissue]
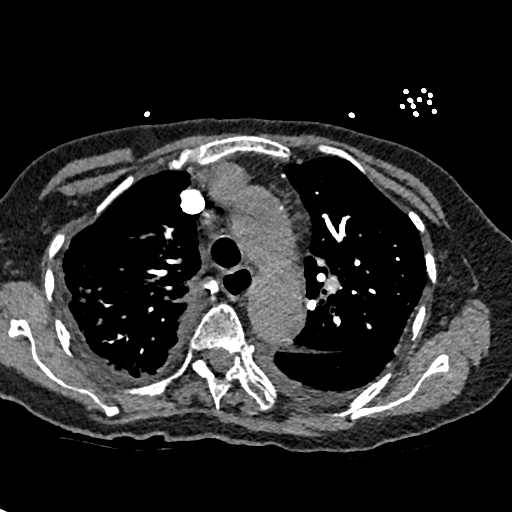
[im 224/272  lung]
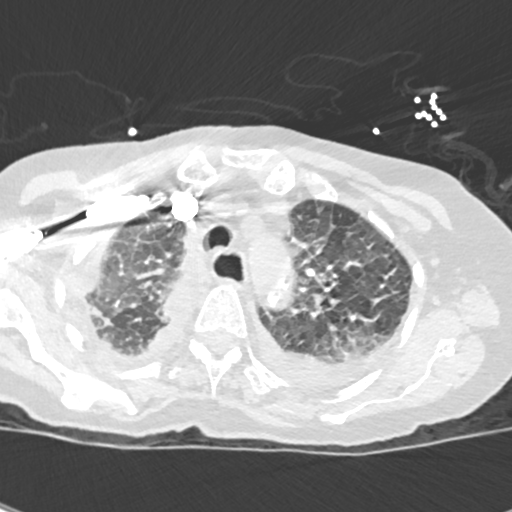
[im 236/272  soft-tissue]
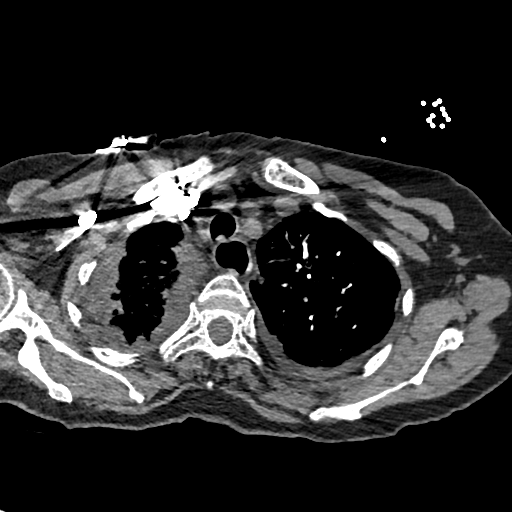
[im 260/272  lung]
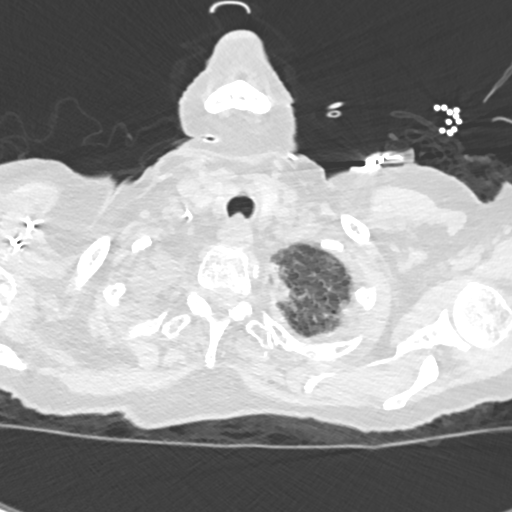

[Series 8: cor soft · coronal · 0.56mm/px · 3 of 118 slices shown]
[im 30/118  soft-tissue]
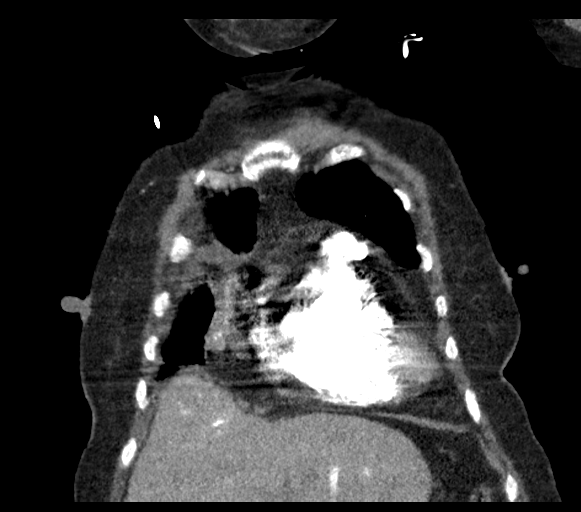
[im 59/118  soft-tissue]
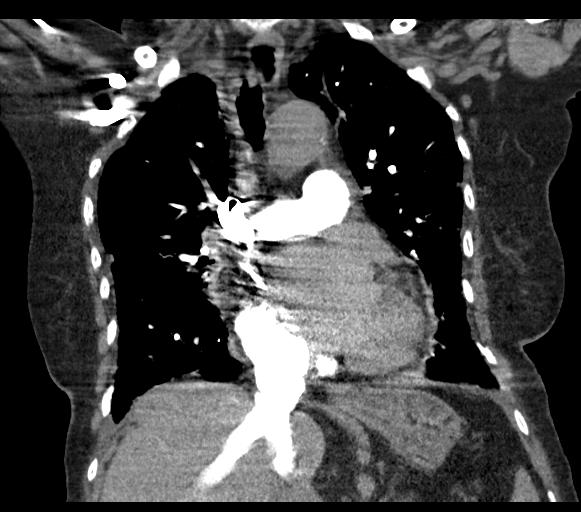
[im 88/118  soft-tissue]
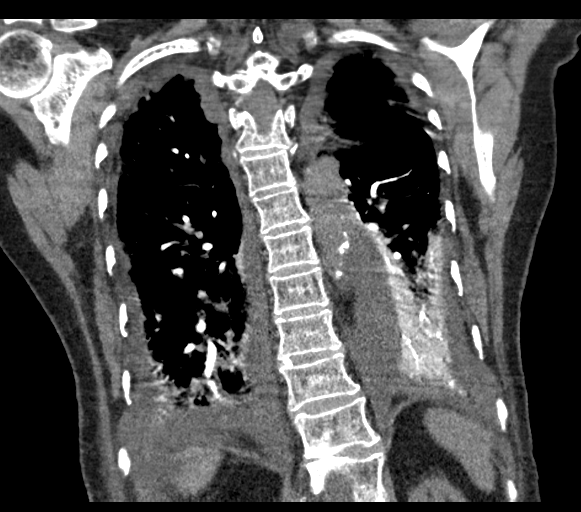

[18 of 46 positions shown; findings below may reference images not displayed]

FINDINGS: Cardiovascular: The heart is mildly enlarged. Mild atherosclerotic
changes are noted in the aorta without aneurysm. Great vessel
origins are within normal limits.

Pulmonary artery opacification is excellent. Main pulmonary artery
is mildly enlarged, measuring 3.7 cm. No focal filling defects are
present to suggest pulmonary emboli. Branch vessels are within
normal limits.

Mediastinum/Nodes: No significant mediastinal, hilar, or axillary
adenopathy is present.

Lungs/Pleura: Extensive pleural thickening is present throughout the
right lung. Pleural nodular disease is present in the right lower
lobe with soft tissue adjacent to the superior segment measuring up
to 1.5 cm. Patchy nodular densities are present in the upper lobes
bilaterally. 8 mm focal nodule in the right upper lobe is pleural
based and stable. Bilateral layering effusions are present, left
greater than right. There is partial collapse of the lower lobes
bilaterally, left greater than right.

Upper Abdomen: No acute abnormality.

Musculoskeletal: No chest wall abnormality. No acute or significant
osseous findings.

Review of the MIP images confirms the above findings.
IMPRESSION: 1. No pulmonary embolus.
2. Mild enlargement of the main pulmonary artery suggesting
pulmonary arterial hypertension.
3. Extensive pleural thickening throughout the right lung. This is
new from the prior exam. It may be related to the acute effusions
and reason pulmonary disease. Recommend follow-up CT of the chest
with contrast after treatment of acute disease and volume overload.
4. Bilateral pleural effusions, left greater than right.
5. Bilateral lower lobe collapse, left greater than right.
6. Aortic Atherosclerosis ([DT]-[DT]).
7.

## 2019-03-04 IMAGING — US US THORACENTESIS ASP PLEURAL SPACE W/IMG GUIDE
1 series · 2 of 2 positions shown · non-contrast
Comparison: none

Addendum:
INDICATION: LEFT pleural effusion

[Series 1: us thoracentesis asp pleural space w/img guide · 2 of 2 slices shown]
[im 1/2]
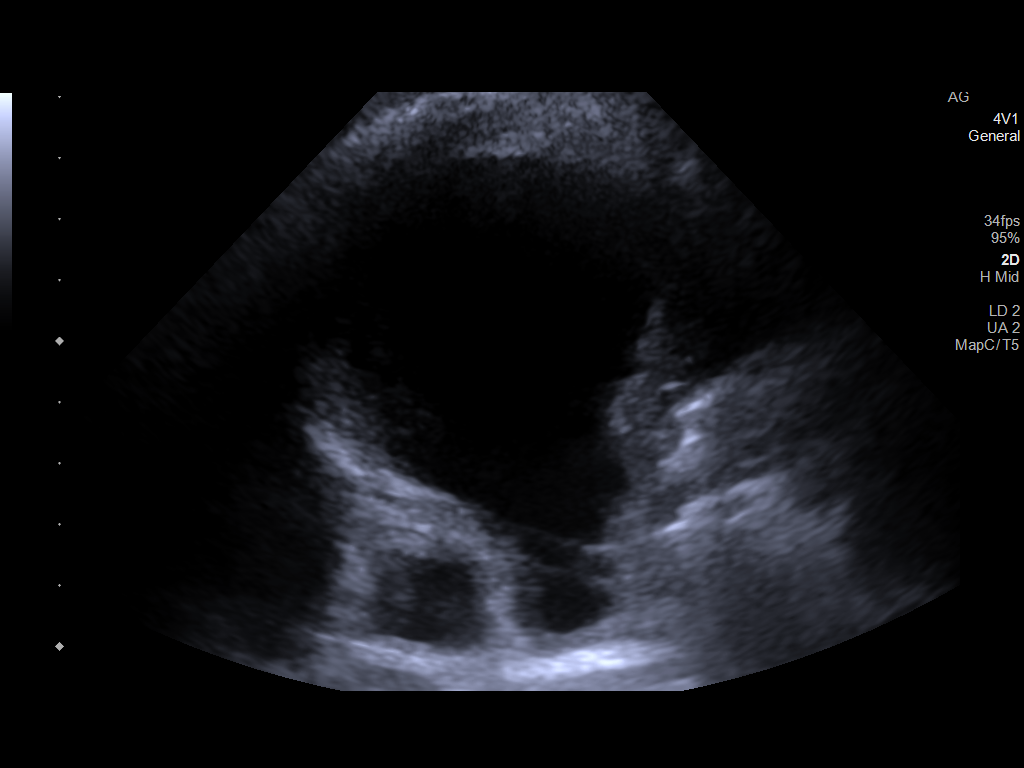
[im 2/2]
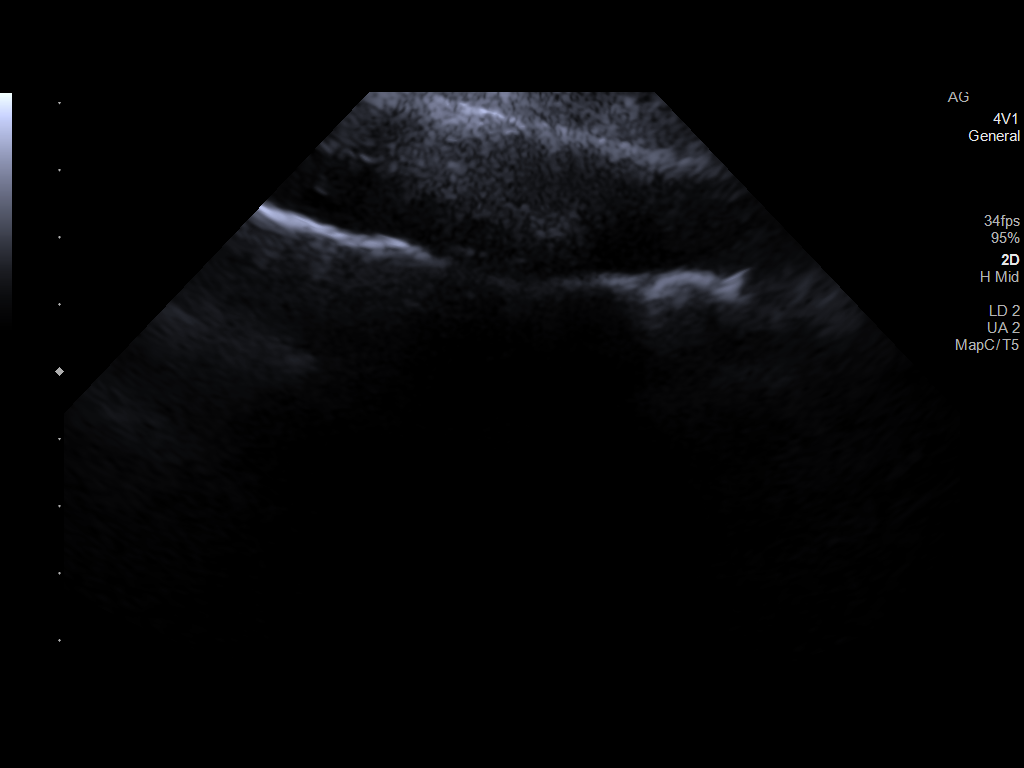

[2 of 2 positions shown; findings below may reference images not displayed]

EXAM:
ULTRASOUND GUIDED DIAGNOSTIC LEFT THORACENTESIS

MEDICATIONS:
None

COMPLICATIONS:
None immediate

PROCEDURE:
An ultrasound guided thoracentesis was thoroughly discussed with the
patient and questions answered. The benefits, risks, alternatives
and complications were also discussed. The patient understands and
wishes to proceed with the procedure. Written consent was obtained.

Ultrasound was performed to localize and mark an adequate pocket of
fluid in the LEFT chest. The area was then prepped and draped in the
normal sterile fashion. 1% Lidocaine was used for local anesthesia.
Under ultrasound guidance a 8 French thoracentesis catheter was
introduced. Thoracentesis was performed. The catheter was removed
and a dressing applied.
FINDINGS: A total of approximately 230 mL of serosanguineous LEFT pleural
fluid was removed.

Septations are seen within the effusion, appeared multiloculated.

Samples were sent to the laboratory as requested by the clinical
team.
IMPRESSION: Successful ultrasound guided LEFT thoracentesis yielding 230 mL of
pleural fluid.

ADDENDUM:
Error in initial report.

Removed LEFT pleural fluid was YELLOW, not serosanguinous.

Remainder of report correct as dictated.

*** End of Addendum ***
EXAM:
ULTRASOUND GUIDED DIAGNOSTIC LEFT THORACENTESIS

MEDICATIONS:
None

COMPLICATIONS:
None immediate

PROCEDURE:
An ultrasound guided thoracentesis was thoroughly discussed with the
patient and questions answered. The benefits, risks, alternatives
and complications were also discussed. The patient understands and
wishes to proceed with the procedure. Written consent was obtained.

Ultrasound was performed to localize and mark an adequate pocket of
fluid in the LEFT chest. The area was then prepped and draped in the
normal sterile fashion. 1% Lidocaine was used for local anesthesia.
Under ultrasound guidance a 8 French thoracentesis catheter was
introduced. Thoracentesis was performed. The catheter was removed
and a dressing applied.
FINDINGS: A total of approximately 230 mL of serosanguineous LEFT pleural
fluid was removed.

Septations are seen within the effusion, appeared multiloculated.

Samples were sent to the laboratory as requested by the clinical
team.
IMPRESSION: Successful ultrasound guided LEFT thoracentesis yielding 230 mL of
pleural fluid.

## 2019-03-04 MED ORDER — TRAZODONE HCL 50 MG PO TABS
25.0000 mg | ORAL_TABLET | Freq: Every evening | ORAL | Status: DC | PRN
  Administered 2019-03-05 – 2019-03-07 (×2): 25 mg via ORAL
  Filled 2019-03-04 (×2): qty 1

## 2019-03-04 MED ORDER — ENSURE ENLIVE PO LIQD
237.0000 mL | Freq: Two times a day (BID) | ORAL | Status: DC
  Administered 2019-03-04 – 2019-03-05 (×2): 237 mL via ORAL

## 2019-03-04 MED ORDER — FUROSEMIDE 10 MG/ML IJ SOLN
40.0000 mg | Freq: Once | INTRAMUSCULAR | Status: AC
  Administered 2019-03-04: 40 mg via INTRAVENOUS
  Filled 2019-03-04: qty 4

## 2019-03-04 MED ORDER — LEVOCETIRIZINE DIHYDROCHLORIDE 5 MG PO TABS: 5.0000 mg | ORAL_TABLET | Freq: Every evening | ORAL | Status: DC

## 2019-03-04 MED ORDER — ONDANSETRON HCL 4 MG/2ML IJ SOLN
4.0000 mg | Freq: Four times a day (QID) | INTRAMUSCULAR | Status: DC | PRN
  Administered 2019-03-05 (×2): 4 mg via INTRAVENOUS
  Filled 2019-03-04 (×2): qty 2

## 2019-03-04 MED ORDER — ASPIRIN 81 MG PO CHEW
324.0000 mg | CHEWABLE_TABLET | Freq: Once | ORAL | Status: AC
  Administered 2019-03-04: 324 mg via ORAL
  Filled 2019-03-04: qty 4

## 2019-03-04 MED ORDER — LORATADINE 10 MG PO TABS
10.0000 mg | ORAL_TABLET | Freq: Every evening | ORAL | Status: DC
  Administered 2019-03-04 – 2019-03-06 (×3): 10 mg via ORAL
  Filled 2019-03-04 (×3): qty 1

## 2019-03-04 MED ORDER — HEPARIN SODIUM (PORCINE) 5000 UNIT/ML IJ SOLN
5000.0000 [IU] | Freq: Three times a day (TID) | INTRAMUSCULAR | Status: DC
  Administered 2019-03-04 – 2019-03-07 (×8): 5000 [IU] via SUBCUTANEOUS
  Filled 2019-03-04 (×8): qty 1

## 2019-03-04 MED ORDER — METOPROLOL SUCCINATE ER 50 MG PO TB24
75.0000 mg | ORAL_TABLET | Freq: Every day | ORAL | Status: DC
  Administered 2019-03-04 – 2019-03-05 (×2): 75 mg via ORAL
  Filled 2019-03-04 (×2): qty 1

## 2019-03-04 MED ORDER — ACETAMINOPHEN 650 MG RE SUPP: 650.0000 mg | Freq: Four times a day (QID) | RECTAL | Status: DC | PRN

## 2019-03-04 MED ORDER — PANTOPRAZOLE SODIUM 40 MG PO TBEC
40.0000 mg | DELAYED_RELEASE_TABLET | Freq: Every day | ORAL | Status: DC
  Administered 2019-03-04 – 2019-03-07 (×4): 40 mg via ORAL
  Filled 2019-03-04 (×4): qty 1

## 2019-03-04 MED ORDER — ACETAMINOPHEN 325 MG PO TABS
650.0000 mg | ORAL_TABLET | Freq: Four times a day (QID) | ORAL | Status: DC | PRN
  Administered 2019-03-06 – 2019-03-07 (×2): 650 mg via ORAL
  Filled 2019-03-04 (×2): qty 2

## 2019-03-04 MED ORDER — AMIODARONE HCL 200 MG PO TABS
200.0000 mg | ORAL_TABLET | Freq: Every day | ORAL | Status: DC
  Administered 2019-03-04 – 2019-03-07 (×4): 200 mg via ORAL
  Filled 2019-03-04 (×4): qty 1

## 2019-03-04 MED ORDER — DICYCLOMINE HCL 10 MG PO CAPS
10.0000 mg | ORAL_CAPSULE | ORAL | Status: DC | PRN
  Administered 2019-03-05 – 2019-03-06 (×2): 10 mg via ORAL
  Filled 2019-03-04 (×2): qty 1

## 2019-03-04 MED ORDER — SENNOSIDES-DOCUSATE SODIUM 8.6-50 MG PO TABS: 1.0000 | ORAL_TABLET | Freq: Every evening | ORAL | Status: DC | PRN

## 2019-03-04 MED ORDER — DILTIAZEM HCL ER COATED BEADS 180 MG PO CP24
180.0000 mg | ORAL_CAPSULE | Freq: Every day | ORAL | Status: DC
  Administered 2019-03-04 – 2019-03-05 (×2): 180 mg via ORAL
  Filled 2019-03-04 (×2): qty 1

## 2019-03-04 MED ORDER — IOHEXOL 350 MG/ML SOLN
100.0000 mL | Freq: Once | INTRAVENOUS | Status: AC | PRN
  Administered 2019-03-04: 100 mL via INTRAVENOUS

## 2019-03-04 MED ORDER — ALPRAZOLAM 0.25 MG PO TABS
0.2500 mg | ORAL_TABLET | Freq: Two times a day (BID) | ORAL | Status: DC | PRN
  Administered 2019-03-06 (×2): 0.25 mg via ORAL
  Filled 2019-03-04 (×2): qty 1

## 2019-03-04 MED ORDER — ONDANSETRON HCL 4 MG PO TABS
4.0000 mg | ORAL_TABLET | Freq: Four times a day (QID) | ORAL | Status: DC | PRN
  Administered 2019-03-06 (×2): 4 mg via ORAL
  Filled 2019-03-04 (×2): qty 1

## 2019-03-04 NOTE — ED Triage Notes (Signed)
Pt called [facility] from home c/o SOB. Pt reports this is a recurring problem with her A-fib, and was just discharged from [facility] yesterday 03/03/2019 @ 1108. Pt currently on 4L O2 nasal cannula but normally on room air at home. Pts daughter at home when EMS arrived.

## 2019-03-04 NOTE — Progress Notes (Signed)
Thoracentesis complete no signs of distress.  

## 2019-03-04 NOTE — H&P (Addendum)
History and Physical  [facility]  Renee Blake [id number] DOB: [birth year] DOA: 03/04/2019  PCP: [name], MD  Patient coming from: Home   I have personally briefly reviewed patient's old medical records in [place]  Chief Complaint: shortness of breath   HPI: Renee Blake is a 85 y.o. female with medical history significant for chronic atrial fibrillation that is not anticoagulated due to GI bleeding, chronic anemia with history of severe life threatening GI bleed, hypertension, GERD, gastric ulcer, who was recently discharged after an admission for atrial fibrillation with RVR returns to ED with symptoms of recurrent shortness of breath.  She reported that after she had arrived home she initially felt fine but shortly afterwards began to have progressive symptoms of shortness of breath. She was very concerned that it was an occurrence of her rapid atrial fibrillation but she did not feel chest pain or palpitations.  She has some symptoms of burning in the chest and acid reflux type symptoms.  Nothing seemed to help her shortness of breath.  She does not use oxygen at home.  Her sister had her returned to the ER for evaluation.   ED Course: Pt was hypoxic on arrival and started on nasal cannula oxygen with some improvement in symptoms.  She was tachypneic with RR of 23-29.  Given that she is not anticoagulated she was given a CTA chest with findings of pleural effusions, pleural thickening, atelectasis and volume overload. Her creatinine was mildly elevated at 1.82, glucose was 120, BNP was 412, Hg was 10.6, platelets 267, troponin 5.   Pt was given IV lasix 40 mg and admission was requested.   Review of Systems: As per HPI otherwise 10 point review of systems negative.    Past Medical History:  Diagnosis Date  . Acute blood loss anemia   . Atrial fibrillation ([place])    a. diagnosed in 12/2018. Rate-control pursued given not a candidate for anticoagulation  .  Chronic diarrhea   . Dilation of biliary tract    biliary duct per 02/26/13 CT  . Duodenal stricture   . Esophageal stricture   . Gastric ulcer   . GI bleed   . Hyperlipemia   . Hypertension   . Microscopic colitis   . Nephrolithiasis   . Pancreatic duct dilated 02/26/13  . Schatzki's ring   . Skin cancer   . Sliding hiatal hernia   . Vertigo     Past Surgical History:  Procedure Laterality Date  . BIOPSY  07/28/2017   Procedure: BIOPSY;  Surgeon: Rogene Houston, MD;  Location: [facility];  Service: Endoscopy;;  rectum  . COLONOSCOPY N/A 07/28/2017   Procedure: COLONOSCOPY;  Surgeon: Rogene Houston, MD;  Location: [facility];  Service: Endoscopy;  Laterality: N/A;  . ESOPHAGOGASTRODUODENOSCOPY N/A 02/27/2013   Procedure: ESOPHAGOGASTRODUODENOSCOPY (EGD);  Surgeon: Ladene Artist, MD;  Location[facility];  Service: Endoscopy;  Laterality: N/A;  . ESOPHAGOGASTRODUODENOSCOPY N/A 03/28/2013   Procedure: ESOPHAGOGASTRODUODENOSCOPY (EGD);  Surgeon: Rogene Houston, MD;  [facility]: [facility];  Service: Endoscopy;  Laterality: N/A;  . HOT HEMOSTASIS N/A 02/27/2013   Procedure: HOT HEMOSTASIS (ARGON PLASMA COAGULATION/BICAP);  Surgeon: Ladene Artist, MD;  Location[facility] ENDOSCOPY;  Service: Endoscopy;  Laterality: N/A;  . NOSE SURGERY     for skin cancer  . POLYPECTOMY  07/28/2017   Procedure: POLYPECTOMY;  Surgeon: Rogene Houston, MD;  Location: [facility];  Service: Endoscopy;;  colon      reports that she has never smoked. She has never used smokeless tobacco. She reports that she does not drink alcohol or use drugs.  Allergies  Allergen Reactions  . Clonazepam Other (See Comments)    Pruritis  . Codeine Nausea And Vomiting and Other (See Comments)    Reaction unknown  . Sulfa Antibiotics Other (See Comments)    Reactions unknown    Family History  Problem Relation Age of Onset  . Heart attack Father      Prior to Admission medications    Medication Sig Start Date End Date Taking? Authorizing Provider  acetaminophen (TYLENOL) 325 MG tablet Take 2 tablets (650 mg total) by mouth every 6 (six) hours as needed for mild pain, fever or headache (or Fever >/= 101). 02/06/19   Roxan Hockey, MD  ALPRAZolam Duanne Moron) 0.25 MG tablet Take 1 tablet (0.25 mg total) by mouth 2 (two) times daily as needed. for anxiety 02/06/19   Roxan Hockey, MD  amiodarone (PACERONE) 200 MG tablet Take 1 tablet (200 mg total) by mouth daily. 02/18/19   Strader, Fransisco Hertz, PA-C  dicyclomine (BENTYL) 10 MG capsule Take 10 mg by mouth every 4 (four) hours as needed for spasms.    [provider]  diltiazem (CARDIZEM CD) 180 MG 24 hr capsule Take 1 capsule (180 mg total) by mouth daily. 02/18/19   Strader, Fransisco Hertz, PA-C  furosemide (LASIX) 20 MG tablet Take 1 tablet (20 mg total) by mouth daily. 02/07/19   Roxan Hockey, MD  levocetirizine (XYZAL) 5 MG tablet Take 5 mg by mouth every evening.    [provider]  meclizine (ANTIVERT) 25 MG tablet Take 25 mg by mouth daily as needed for dizziness.  07/11/17   [provider]  metoprolol succinate (TOPROL-XL) 25 MG 24 hr tablet Take 3 tablets (75 mg total) by mouth daily. 03/04/19 04/03/19  Manuella Ghazi, Pratik D, DO  ondansetron (ZOFRAN) 4 MG tablet Take 4 mg by mouth every 8 (eight) hours as needed for nausea or vomiting.    [provider]  pantoprazole (PROTONIX) 40 MG tablet Take 1 tablet (40 mg total) by mouth daily. 02/06/19   Roxan Hockey, MD    Physical Exam: Vitals:   03/04/19 0530 03/04/19 0600 03/04/19 0630 03/04/19 0700  BP: 110/90 (!) 121/99 104/80 122/79  Pulse: 97 95 92   Resp: (!) 22 (!) 30 20 (!) 26  Temp:      TempSrc:      SpO2: 90% 95% 90%    Constitutional: elderly frail, cachectic appearing chronically ill appearing female, lying supine in bed, in moderate distress, but able to speak full sentences.  Eyes: PERRL, lids and conjunctivae normal ENMT:  Mucous membranes are moist. Posterior pharynx clear of any exudate or lesions.   Neck: normal, supple, no masses, no thyromegaly Respiratory: bibasilar crackles, poor air movement, no wheezing, no crackles. Normal respiratory effort. No accessory muscle use.  Cardiovascular: irregularly irregular.  No extremity edema. 2+ pedal pulses. No carotid bruits.  Abdomen: no tenderness, no masses palpated. No hepatosplenomegaly. Bowel sounds positive.  Musculoskeletal: no clubbing / cyanosis. No joint deformity upper and lower extremities. Good ROM, no contractures. Normal muscle tone.  Skin: no rashes, lesions, ulcers. No induration Neurologic: CN 2-12 grossly intact. Sensation intact, DTR normal. Strength 5/5 in all 4.  Psychiatric: Alert and oriented x 3. Normal mood.   Labs on Admission: I have personally reviewed following labs and imaging studies  CBC:  Recent Labs  Lab 03/02/19 0124 03/04/19 0417  WBC 8.5 7.1  NEUTROABS  --  5.0  HGB 10.2* 10.6*  HCT 34.0* 35.7*  MCV 95.8 93.5  PLT 277 [id number]   Basic Metabolic Panel: Recent Labs  Lab 03/02/19 0124 03/02/19 0455 03/03/19 0426 03/04/19 0417  NA 138  --  139 139  K 4.8  --  4.0 5.0  CL 103  --  103 101  CO2 27  --  25 25  GLUCOSE 116*  --  90 120*  BUN 21  --  16 25*  CREATININE 1.58*  --  1.21* 1.82*  CALCIUM 8.7*  --  8.9 9.4  MG  --  2.0  --   --    GFR: Estimated Creatinine Clearance: 17.4 mL/min (A) (by C-G formula based on SCr of 1.82 mg/dL (H)). Liver Function Tests: Recent Labs  Lab 03/02/19 0124  AST 11*  ALT 9  ALKPHOS 70  BILITOT 0.2*  PROT 7.0  ALBUMIN 3.4*   No results for input(s): LIPASE, AMYLASE in the last 168 hours. No results for input(s): AMMONIA in the last 168 hours. Coagulation Profile: No results for input(s): INR, PROTIME in the last 168 hours. Cardiac Enzymes: No results for input(s): CKTOTAL, CKMB, CKMBINDEX, TROPONINI in the last 168 hours. BNP (last 3 results) No results for input(s):  PROBNP in the last 8760 hours. HbA1C: No results for input(s): HGBA1C in the last 72 hours. CBG: No results for input(s): GLUCAP in the last 168 hours. Lipid Profile: No results for input(s): CHOL, HDL, LDLCALC, TRIG, CHOLHDL, LDLDIRECT in the last 72 hours. Thyroid Function Tests: Recent Labs    03/02/19 0124 03/02/19 0455  TSH 8.065*  --   FREET4  --  1.09   Anemia Panel: No results for input(s): VITAMINB12, FOLATE, FERRITIN, TIBC, IRON, RETICCTPCT in the last 72 hours. Urine analysis:    Component Value Date/Time   COLORURINE YELLOW 01/27/2019 1533   APPEARANCEUR CLOUDY (A) 01/27/2019 1533   LABSPEC 1.005 01/27/2019 1533   PHURINE 7.0 01/27/2019 1533   GLUCOSEU NEGATIVE 01/27/2019 1533   HGBUR SMALL (A) 01/27/2019 1533   BILIRUBINUR NEGATIVE 01/27/2019 1533   [place] 01/27/2019 1533   PROTEINUR NEGATIVE 01/27/2019 1533   UROBILINOGEN 0.2 08/22/2014 1300   NITRITE NEGATIVE 01/27/2019 1533   LEUKOCYTESUR LARGE (A) 01/27/2019 1533    Radiological Exams on Admission: Ct Angio Chest Pe W And/or Wo Contrast  Result Date: 03/04/2019 CLINICAL DATA:  PE suspected, high pretest probability. Shortness of breath. Atrial fibrillation. Patient discharged from [facility] yesterday for treatment of congestive heart failure. EXAM: CT ANGIOGRAPHY CHEST WITH CONTRAST TECHNIQUE: Multidetector CT imaging of the chest was performed using the standard protocol during bolus administration of intravenous contrast. Multiplanar CT image reconstructions and MIPs were obtained to evaluate the vascular anatomy. CONTRAST:  175mL OMNIPAQUE IOHEXOL 350 MG/ML SOLN COMPARISON:  One-view chest x-ray 03/02/2019. CTA chest 08/01/2017 FINDINGS: Cardiovascular: The heart is mildly enlarged. Mild atherosclerotic changes are noted in the aorta without aneurysm. Great vessel origins are within normal limits. Pulmonary artery opacification is excellent. Main pulmonary artery is mildly enlarged,  measuring 3.7 cm. No focal filling defects are present to suggest pulmonary emboli. Branch vessels are within normal limits. Mediastinum/Nodes: No significant mediastinal, hilar, or axillary adenopathy is present. Lungs/Pleura: Extensive pleural thickening is present throughout the right lung. Pleural nodular disease is present in the right lower lobe with soft tissue adjacent to the superior segment measuring up to  1.5 cm. Patchy nodular densities are present in the upper lobes bilaterally. 8 mm focal nodule in the right upper lobe is pleural based and stable. Bilateral layering effusions are present, left greater than right. There is partial collapse of the lower lobes bilaterally, left greater than right. Upper Abdomen: No acute abnormality. Musculoskeletal: No chest wall abnormality. No acute or significant osseous findings. Review of the MIP images confirms the above findings. IMPRESSION: 1. No pulmonary embolus. 2. Mild enlargement of the main pulmonary artery suggesting pulmonary arterial hypertension. 3. Extensive pleural thickening throughout the right lung. This is new from the prior exam. It may be related to the acute effusions and reason pulmonary disease. Recommend follow-up CT of the chest with contrast after treatment of acute disease and volume overload. 4. Bilateral pleural effusions, left greater than right. 5. Bilateral lower lobe collapse, left greater than right. 6. Aortic Atherosclerosis (ICD10-I70.0). 7. Electronically Signed   By: San Morelle M.D.   On: 03/04/2019 05:30   Assessment/Plan Active Problems:   H/o Prior Lower GI bleed (anorectal Ca)   Malnutrition of moderate degree (HCC)   Generalized weakness   Dyspnea   AKI (acute kidney injury) ([place])   Chronic renal failure, stage 3b   Acute respiratory failure with hypoxia (HCC)   Bilateral pleural effusion   Chronic atrial fibrillation ([place])   DNR (do not resuscitate)   1. Acute respiratory failure with hypoxia -  secondary to volume overload, bilateral pleural effusions, pulmonary atelectasis and pleural thickening.  I have asked for radiology to evaluate patient for possible thoracentesis to help relieve symptoms.  Continue supplemental oxygen.  Continue diuresis.  She has been given IV lasix in ED and will monitor response.  2. Acute diastolic CHF exacerbation - She remains volume overloaded mostly in the lungs. She is being diuresed with IV lasix.  Will monitor response.  3. Bilateral pleural effusion - Consult to radiology to consider US thoracentesis.  Sending fluid for studies if performed.  4. AKI - mild bump in creatinine noted, will follow closely in the setting of diuresis and would consult nephrology if renal function continues to decline.  Will give diuretic holiday for the rest of the day and hopefully can restart diuretics tomorrow.  She received 1 dose of IV lasix in ED this morning.  5. Generalized weakness - Pt remains very weak and deconditioned from chronic illness. When medically stabilized would get PT evaluation.  6. Moderate to severe malnutrition - nutritional supplements ordered.  7. Chronic atrial fibrillation - she was recently admitted for this and seen by cardiology.  Her rate is controlled at this time and she has been resumed on all of her antiarrhythmic and rate control medications.  Again, she has not been fully anticoagulated due to her history of severe life threatening GI bleed.   8. Bilateral atelectasis - Incentive spirometry with RT support ordered.  9. DNR present on admission - will honor during this hospitalization.   DVT prophylaxis: subcutaneous heparin/ SCDs  Code Status: DNR present on admission  Family Communication: at bedside, updated, verbalized understanding  Disposition Plan: PT eval when medically stabilized  Consults called:   Admission status: inpatient    Irwin Brakeman MD [facility] How to contact the [facility] Attending or Consulting provider [place] or covering provider during after hours [place], for this patient?  1. Check the care team in [facility] and look for a) attending/consulting [facility] provider listed and b) the [facility] team listed 2.  Log into [email or web address] and use [place]'s universal password to access. If you do not have the password, please contact the hospital operator. 3. Locate the [facility] provider you are looking for under [facility] and page to a number that you can be directly reached. 4. If you still have difficulty reaching the provider, please page the [facility] (Director on Call) for the Hospitalists listed on amion for assistance.   If 7PM-7AM, please contact night-coverage [email or web address] Password [facility]  03/04/2019, 8:24 AM

## 2019-03-04 NOTE — ED Provider Notes (Signed)
[facility] EMERGENCY DEPARTMENT Provider Note   CSN: [id number] Arrival date & time: 03/04/19  0221    History   Chief Complaint Chief Complaint  Patient presents with   Shortness of Breath    HPI Renee Blake is a 85 y.o. female.   The history is provided by the patient.  Shortness of Breath She has history of hypertension, hyperlipidemia, atrial fibrillation without anticoagulation and comes in because of chest pain and shortness of breath which developed this evening.  She had been in the hospital recently because of atrial fibrillation and shortness of breath.  She felt fine when she went home at about noon on December 2.  This evening, she developed a burning feeling in her chest with associated hot feeling, nausea and severe dyspnea.  There was no vomiting and no diaphoresis.  The burning feeling has improved but she continues to have dyspnea.  Prior episodes of dyspnea been associated with a rapid heart rate.  Of note, she is not on anticoagulation because of history of GI bleeds.  Past Medical History:  Diagnosis Date   Acute blood loss anemia    Atrial fibrillation ([place])    a. diagnosed in 12/2018. Rate-control pursued given not a candidate for anticoagulation   Chronic diarrhea    Dilation of biliary tract    biliary duct per 02/26/13 CT   Duodenal stricture    Esophageal stricture    Gastric ulcer    GI bleed    Hyperlipemia    Hypertension    Microscopic colitis    Nephrolithiasis    Pancreatic duct dilated 02/26/13   Schatzki's ring    Skin cancer    Sliding hiatal hernia    Vertigo     Patient Active Problem List   Diagnosis Date Noted   Acute on chronic diastolic congestive heart failure ([place]) 03/02/2019   Acute on chronic diastolic HF (heart failure) ([place]) 03/02/2019   Chronic renal failure, stage 3b 03/02/2019   Acute respiratory failure with hypoxia ([place]) 03/02/2019   Acute exacerbation of CHF (congestive heart failure)  ([place]) 01/27/2019   Atrial fibrillation with RVR ([place]) 01/27/2019   Iron deficiency anemia due to chronic blood loss---H/o Gi Bleed 01/27/2019   Vaginal mass 07/29/2017   Heme positive stool 07/25/2017   Cellulitis 12/10/2016   Facial cellulitis    Erroneous encounter - disregard 03/17/2015   AKI (acute kidney injury) ([place])    Metabolic acidosis 0000000   Acute renal failure syndrome ([place])    Renal failure 08/22/2014   Hyperkalemia 08/22/2014   UTI (urinary tract infection) 08/22/2014   Acute renal failure ([place]) 08/22/2014   Dyspnea 03/15/2014   Diabetes mellitus ([place]) [id number]   Acute diastolic congestive heart failure ([place]) 06/07/2013   Syncope 03/26/2013   Rectal bleeding 03/26/2013   Dehydration 03/26/2013   Generalized weakness 03/26/2013   Malnutrition of moderate degree ([place]) 03/01/2013   Diarrhea 02/28/2013   Loss of weight 02/28/2013   H/o Prior duodenal ulcer with hemorrhage 02/27/2013   H/o Prior Lower GI bleed (anorectal Ca) 02/26/2013   Acute blood loss anemia 02/26/2013   UTI (lower urinary tract infection) 02/26/2013   Hypertension 02/26/2013    Past Surgical History:  Procedure Laterality Date   BIOPSY  07/28/2017   Procedure: BIOPSY;  Surgeon: Rogene Houston, MD;  [facility];  Service: Endoscopy;;  rectum   COLONOSCOPY N/A 07/28/2017   Procedure: COLONOSCOPY;  Surgeon: Rogene Houston, MD;  Location: [facility];  Service: Endoscopy;  Laterality: N/A;   ESOPHAGOGASTRODUODENOSCOPY N/A 02/27/2013   Procedure: ESOPHAGOGASTRODUODENOSCOPY (EGD);  Surgeon: Ladene Artist, MD;  Location: [facility] ENDOSCOPY;  Service: Endoscopy;  Laterality: N/A;   ESOPHAGOGASTRODUODENOSCOPY N/A 03/28/2013   Procedure: ESOPHAGOGASTRODUODENOSCOPY (EGD);  Surgeon: Rogene Houston, MD;  Location: [facility];  Service: Endoscopy;  Laterality: N/A;   HOT HEMOSTASIS N/A 02/27/2013   Procedure: HOT HEMOSTASIS (ARGON PLASMA  COAGULATION/BICAP);  Surgeon: Ladene Artist, MD;  Location: [facility] ENDOSCOPY;  Service: Endoscopy;  Laterality: N/A;   NOSE SURGERY     for skin cancer   POLYPECTOMY  07/28/2017   Procedure: POLYPECTOMY;  Surgeon: Rogene Houston, MD;  Location: [facility];  Service: Endoscopy;;  colon      OB History    Gravida  6   Para  6   Term  6   Preterm      AB      Living  6     SAB      TAB      Ectopic      Multiple      Live Births               Home Medications    Prior to Admission medications   Medication Sig Start Date End Date Taking? Authorizing Provider  acetaminophen (TYLENOL) 325 MG tablet Take 2 tablets (650 mg total) by mouth every 6 (six) hours as needed for mild pain, fever or headache (or Fever >/= 101). 02/06/19   Roxan Hockey, MD  ALPRAZolam Duanne Moron) 0.25 MG tablet Take 1 tablet (0.25 mg total) by mouth 2 (two) times daily as needed. for anxiety 02/06/19   Roxan Hockey, MD  amiodarone (PACERONE) 200 MG tablet Take 1 tablet (200 mg total) by mouth daily. 02/18/19   Strader, Fransisco Hertz, PA-C  dicyclomine (BENTYL) 10 MG capsule Take 10 mg by mouth every 4 (four) hours as needed for spasms.    [provider]  diltiazem (CARDIZEM CD) 180 MG 24 hr capsule Take 1 capsule (180 mg total) by mouth daily. 02/18/19   Strader, Fransisco Hertz, PA-C  furosemide (LASIX) 20 MG tablet Take 1 tablet (20 mg total) by mouth daily. 02/07/19   Roxan Hockey, MD  levocetirizine (XYZAL) 5 MG tablet Take 5 mg by mouth every evening.    [provider]  meclizine (ANTIVERT) 25 MG tablet Take 25 mg by mouth daily as needed for dizziness.  07/11/17   [provider]  metoprolol succinate (TOPROL-XL) 25 MG 24 hr tablet Take 3 tablets (75 mg total) by mouth daily. 03/04/19 04/03/19  Manuella Ghazi, Pratik D, DO  ondansetron (ZOFRAN) 4 MG tablet Take 4 mg by mouth every 8 (eight) hours as needed for nausea or vomiting.    [provider]  pantoprazole  (PROTONIX) 40 MG tablet Take 1 tablet (40 mg total) by mouth daily. 02/06/19   Roxan Hockey, MD    Family History Family History  Problem Relation Age of Onset   Heart attack Father     Social History Social History   Tobacco Use   Smoking status: Never Smoker   Smokeless tobacco: Never Used  Substance Use Topics   Alcohol use: No    Alcohol/week: 0.0 standard drinks   Drug use: No     Allergies   Clonazepam, Codeine, and Sulfa antibiotics   Review of Systems Review of Systems  Respiratory: Positive for shortness of breath.   All other systems reviewed and are  negative.    Physical Exam Updated Vital Signs BP 108/71    Pulse 82    Temp (!) 97.4 F (36.3 C) (Oral)    Resp (!) 23    SpO2 92%   Physical Exam Vitals signs and nursing note reviewed.    85 year old female, resting comfortably and in no acute distress. Vital signs are significant for elevated respiratory rate. Oxygen saturation is 92%, which is normal, but is only maintained with supplemental nasal oxygen. Head is normocephalic and atraumatic. PERRLA, EOMI. Oropharynx is clear. Neck is nontender and supple without adenopathy or JVD. Back is nontender and there is no CVA tenderness. Lungs are clear without rales, wheezes, or rhonchi. Chest is nontender. Heart has an irregular rhythm without murmur. Abdomen is soft, flat, nontender without masses or hepatosplenomegaly and peristalsis is normoactive. Extremities have no cyanosis or edema, full range of motion is present. Skin is warm and dry without rash. Neurologic: Mental status is normal, cranial nerves are intact, there are no motor or sensory deficits.  ED Treatments / Results  Labs (all labs ordered are listed, but only abnormal results are displayed) Labs Reviewed  BASIC METABOLIC PANEL - Abnormal; Notable for the following components:      Result Value   Glucose, Bld 120 (*)    BUN 25 (*)    Creatinine, Ser 1.82 (*)    GFR calc non  Af Amer 24 (*)    GFR calc Af Amer 28 (*)    All other components within normal limits  CBC WITH DIFFERENTIAL/PLATELET - Abnormal; Notable for the following components:   RBC 3.82 (*)    Hemoglobin 10.6 (*)    HCT 35.7 (*)    MCHC 29.7 (*)    All other components within normal limits  BRAIN NATRIURETIC PEPTIDE - Abnormal; Notable for the following components:   B Natriuretic Peptide 412.0 (*)    All other components within normal limits  SARS CORONAVIRUS 2 (TAT 6-24 HRS)  TROPONIN I (HIGH SENSITIVITY)  TROPONIN I (HIGH SENSITIVITY)    EKG EKG Interpretation  Date/Time:  Thursday March 04 2019 02:38:27 EST Ventricular Rate:  88 PR Interval:    QRS Duration: 134 QT Interval:  406 QTC Calculation: 492 R Axis:   -49 Text Interpretation: Atrial fibrillation RBBB and LAFB Minimal ST elevation, lateral leads When compared with ECG of 03/02/2019, No significant change was found Confirmed by Delora Fuel ([id number]) on 03/04/2019 3:59:03 AM   Radiology Ct Angio Chest Pe W And/or Wo Contrast  Result Date: 03/04/2019 CLINICAL DATA:  PE suspected, high pretest probability. Shortness of breath. Atrial fibrillation. Patient discharged from [facility] yesterday for treatment of congestive heart failure. EXAM: CT ANGIOGRAPHY CHEST WITH CONTRAST TECHNIQUE: Multidetector CT imaging of the chest was performed using the standard protocol during bolus administration of intravenous contrast. Multiplanar CT image reconstructions and MIPs were obtained to evaluate the vascular anatomy. CONTRAST:  136mL OMNIPAQUE IOHEXOL 350 MG/ML SOLN COMPARISON:  One-view chest x-ray 03/02/2019. CTA chest 08/01/2017 FINDINGS: Cardiovascular: The heart is mildly enlarged. Mild atherosclerotic changes are noted in the aorta without aneurysm. Great vessel origins are within normal limits. Pulmonary artery opacification is excellent. Main pulmonary artery is mildly enlarged, measuring 3.7 cm. No focal filling defects are  present to suggest pulmonary emboli. Branch vessels are within normal limits. Mediastinum/Nodes: No significant mediastinal, hilar, or axillary adenopathy is present. Lungs/Pleura: Extensive pleural thickening is present throughout the right lung. Pleural nodular disease is present in the  right lower lobe with soft tissue adjacent to the superior segment measuring up to 1.5 cm. Patchy nodular densities are present in the upper lobes bilaterally. 8 mm focal nodule in the right upper lobe is pleural based and stable. Bilateral layering effusions are present, left greater than right. There is partial collapse of the lower lobes bilaterally, left greater than right. Upper Abdomen: No acute abnormality. Musculoskeletal: No chest wall abnormality. No acute or significant osseous findings. Review of the MIP images confirms the above findings. IMPRESSION: 1. No pulmonary embolus. 2. Mild enlargement of the main pulmonary artery suggesting pulmonary arterial hypertension. 3. Extensive pleural thickening throughout the right lung. This is new from the prior exam. It may be related to the acute effusions and reason pulmonary disease. Recommend follow-up CT of the chest with contrast after treatment of acute disease and volume overload. 4. Bilateral pleural effusions, left greater than right. 5. Bilateral lower lobe collapse, left greater than right. 6. Aortic Atherosclerosis (ICD10-I70.0). 7. Electronically Signed   By: San Morelle M.D.   On: 03/04/2019 05:30    Procedures Procedures   Medications Ordered in ED Medications  furosemide (LASIX) injection 40 mg (has no administration in time range)  iohexol (OMNIPAQUE) 350 MG/ML injection 100 mL (100 mLs Intravenous Contrast Given 03/04/19 0438)  aspirin chewable tablet 324 mg (324 mg Oral Given 03/04/19 0426)     Initial Impression / Assessment and Plan / ED Course  I have reviewed the triage vital signs and the nursing notes.  Pertinent labs & imaging  results that were available during my care of the patient were reviewed by me and considered in my medical decision making (see chart for details).  Episode of chest pain and dyspnea of uncertain cause.  She continues to have an oxygen requirement in spite of normal heart rate.  Old records are reviewed confirming recent hospitalization for atrial fibrillation and hypoxia associated with rapid rate.  ECG shows no acute changes.  Will send for CT angiogram to rule out pulmonary embolism.  CT scan shows no evidence of pulmonary embolism, but pleural thickening is noted as well as bilateral pleural effusions.  BNP is modestly elevated to slightly over 400.  Overall picture and feels most consistent with heart failure and she is given a dose of furosemide.  Creatinine has increased over yesterday consistent with acute kidney injury.  Mild anemia is present and unchanged.  Case is discussed with Dr. Wynetta Emery of [facility], who agrees to admit the patient.  Final Clinical Impressions(s) / ED Diagnoses   Final diagnoses:  Hypoxia  Chronic atrial fibrillation (HCC)  Bilateral pleural effusion  Elevated brain natriuretic peptide (BNP) level  Acute kidney injury (nontraumatic) (HCC)  Normochromic normocytic anemia    ED Discharge Orders    None       Delora Fuel, MD [id number] [phone]

## 2019-03-04 NOTE — TOC Initial Note (Signed)
Transition of Care [facility]) - Initial/Assessment Note    Patient Details  Name: Renee Blake MRN: [id number] Date of Birth: 01/14/1929  Transition of Care [facility]) CM/SW Contact:    Ihor Gully, LCSW Phone Number: 03/04/2019, 2:39 PM  Clinical Narrative:                 Patient discharged yesterday, 03/03/2019. From home with daughter. Admitted with Acute respiratory failure with hypoxia. Patient is active with [facility], RN/PT.  TOC will follow and address needs as they arise.   Expected Discharge Plan: Home/Self Care Barriers to Discharge: Continued Medical Work up   Patient Goals and CMS Choice Patient states their goals for this hospitalization and ongoing recovery are:: return home   Choice offered to / list presented to : Adult Children  Expected Discharge Plan and Services Expected Discharge Plan: Home/Self Care     Post Acute Care Choice: Home Health Living arrangements for the past 2 months: Single Family Home                                      Prior Living Arrangements/Services Living arrangements for the past 2 months: Single Family Home Lives with:: Adult Children Patient language and need for interpreter reviewed:: Yes Do you feel safe going back to the place where you live?: Yes      Need for Family Participation in Patient Care: Yes (Comment) Care giver support system in place?: Yes (comment) Current home services: Home RN, Home RT Criminal Activity/Legal Involvement Pertinent to Current Situation/Hospitalization: No - Comment as needed  Activities of Daily Living Home Assistive Devices/Equipment: Walker (specify type) ADL Screening (condition at time of admission) Patient's cognitive ability adequate to safely complete daily activities?: Yes Is the patient deaf or have difficulty hearing?: No Does the patient have difficulty seeing, even when wearing glasses/contacts?: No Does the patient have difficulty concentrating, remembering, or  making decisions?: No Patient able to express need for assistance with ADLs?: Yes Does the patient have difficulty dressing or bathing?: No Independently performs ADLs?: Yes (appropriate for developmental age) Does the patient have difficulty walking or climbing stairs?: No Weakness of Legs: None Weakness of Arms/Hands: None  Permission Sought/Granted                  Emotional Assessment Appearance:: Appears stated age   Affect (typically observed): Appropriate Orientation: : Oriented to Self, Oriented to Place, Oriented to  Time, Oriented to Situation Alcohol / Substance Use: Not Applicable Psych Involvement: No (comment)  Admission diagnosis:  Hypoxia [R09.02] Chronic atrial fibrillation (HCC) [I48.20] Acute kidney injury (nontraumatic) (HCC) [N17.9] Bilateral pleural effusion [J90] Elevated brain natriuretic peptide (BNP) level [R79.89] Normochromic normocytic anemia [D64.9] Patient Active Problem List   Diagnosis Date Noted  . Bilateral pleural effusion 03/04/2019  . Chronic atrial fibrillation ([place]) 03/04/2019  . DNR (do not resuscitate) 03/04/2019  . Acute on chronic diastolic congestive heart failure ([place]) 03/02/2019  . Acute on chronic diastolic HF (heart failure) ([place]) 03/02/2019  . Chronic renal failure, stage 3b 03/02/2019  . Acute respiratory failure with hypoxia ([place]) 03/02/2019  . Acute exacerbation of CHF (congestive heart failure) ([place]) 01/27/2019  . Atrial fibrillation with RVR ([place]) 01/27/2019  . Iron deficiency anemia due to chronic blood loss---H/o Gi Bleed 01/27/2019  . Vaginal mass 07/29/2017  . Heme positive stool 07/25/2017  . Cellulitis 12/10/2016  . Facial cellulitis   .  Erroneous encounter - disregard 03/17/2015  . AKI (acute kidney injury) ([place])   . Metabolic acidosis 0000000  . Acute renal failure syndrome ([place])   . Renal failure 08/22/2014  . Hyperkalemia 08/22/2014  . UTI (urinary tract infection) 08/22/2014  . Acute renal  failure ([place]) 08/22/2014  . Dyspnea 03/15/2014  . Diabetes mellitus ([place]) 06/08/2013  . Acute diastolic congestive heart failure ([place]) 06/07/2013  . Syncope 03/26/2013  . Rectal bleeding 03/26/2013  . Dehydration 03/26/2013  . Generalized weakness 03/26/2013  . Malnutrition of moderate degree ([place]) 03/01/2013  . Diarrhea 02/28/2013  . Loss of weight 02/28/2013  . H/o Prior duodenal ulcer with hemorrhage 02/27/2013  . H/o Prior Lower GI bleed (anorectal Ca) 02/26/2013  . Acute blood loss anemia 02/26/2013  . UTI (lower urinary tract infection) 02/26/2013  . Hypertension 02/26/2013   PCP:  [name], MD Pharmacy:   [facility] [place] [address] Phone: [phone] Fax: [phone]     Social Determinants of Health (SDOH) Interventions    Readmission Risk Interventions Readmission Risk Prevention Plan 03/02/2019 02/06/2019  Transportation Screening Complete Complete  PCP or Specialist Appt within 5-7 Days - Complete  PCP or Specialist Appt within 3-5 Days Complete -  Home Care Screening - Complete  Medication Review (RN CM) - Complete  HRI or Home Care Consult Complete -  Social Work Consult for Recovery Care Planning/Counseling Complete -  Palliative Care Screening Complete -  Medication Review (RN Care Manager) Complete -  Some recent data might be hidden

## 2019-03-04 NOTE — Procedures (Addendum)
PreOperative Dx: LEFT pleural effusion Postoperative Dx: LEFT pleural effusion Procedure:   US guided LEFT thoracentesis Radiologist:  Thornton Papas Anesthesia:  10 ml of 1% lidocaine Specimen:  230 mL of yellowcolored fluid EBL:   < 1 ml Complications: None

## 2019-03-05 DIAGNOSIS — J9601 Acute respiratory failure with hypoxia: Secondary | ICD-10-CM

## 2019-03-05 DIAGNOSIS — I482 Chronic atrial fibrillation, unspecified: Secondary | ICD-10-CM

## 2019-03-05 DIAGNOSIS — Z66 Do not resuscitate: Secondary | ICD-10-CM

## 2019-03-05 DIAGNOSIS — J90 Pleural effusion, not elsewhere classified: Secondary | ICD-10-CM

## 2019-03-05 LAB — CBC
HCT: 31.3 % — ABNORMAL LOW (ref 36.0–46.0)
Hemoglobin: 9.3 g/dL — ABNORMAL LOW (ref 12.0–15.0)
MCH: 28 pg (ref 26.0–34.0)
MCHC: 29.7 g/dL — ABNORMAL LOW (ref 30.0–36.0)
MCV: 94.3 fL (ref 80.0–100.0)
Platelets: 289 10*3/uL (ref 150–400)
RBC: 3.32 MIL/uL — ABNORMAL LOW (ref 3.87–5.11)
RDW: 14.2 % (ref 11.5–15.5)
WBC: 5.8 10*3/uL (ref 4.0–10.5)
nRBC: 0 % (ref 0.0–0.2)

## 2019-03-05 LAB — BASIC METABOLIC PANEL
Anion gap: 12 (ref 5–15)
BUN: 20 mg/dL (ref 8–23)
CO2: 26 mmol/L (ref 22–32)
Calcium: 8.9 mg/dL (ref 8.9–10.3)
Chloride: 102 mmol/L (ref 98–111)
Creatinine, Ser: 1.52 mg/dL — ABNORMAL HIGH (ref 0.44–1.00)
GFR calc Af Amer: 35 mL/min — ABNORMAL LOW (ref >60)
GFR calc non Af Amer: 30 mL/min — ABNORMAL LOW (ref >60)
Glucose, Bld: 93 mg/dL (ref 70–99)
Potassium: 4.1 mmol/L (ref 3.5–5.1)
Sodium: 140 mmol/L (ref 135–145)

## 2019-03-05 LAB — AMYLASE, PLEURAL OR PERITONEAL FLUID: Amylase, Fluid: 23 U/L

## 2019-03-05 LAB — PHOSPHORUS: Phosphorus: 4.4 mg/dL (ref 2.5–4.6)

## 2019-03-05 LAB — MAGNESIUM: Magnesium: 2.1 mg/dL (ref 1.7–2.4)

## 2019-03-05 LAB — PATHOLOGIST SMEAR REVIEW

## 2019-03-05 MED ORDER — DILTIAZEM HCL ER COATED BEADS 120 MG PO CP24
120.0000 mg | ORAL_CAPSULE | Freq: Every day | ORAL | Status: DC
  Administered 2019-03-06 – 2019-03-07 (×2): 120 mg via ORAL
  Filled 2019-03-05 (×2): qty 1

## 2019-03-05 MED ORDER — METOPROLOL SUCCINATE ER 50 MG PO TB24
50.0000 mg | ORAL_TABLET | Freq: Every day | ORAL | Status: DC
  Administered 2019-03-06 – 2019-03-07 (×2): 50 mg via ORAL
  Filled 2019-03-05 (×2): qty 1

## 2019-03-05 MED ORDER — FUROSEMIDE 10 MG/ML IJ SOLN
20.0000 mg | Freq: Two times a day (BID) | INTRAMUSCULAR | Status: DC
  Administered 2019-03-05 – 2019-03-07 (×4): 20 mg via INTRAVENOUS
  Filled 2019-03-05 (×4): qty 2

## 2019-03-05 NOTE — Progress Notes (Signed)
Patient Demographics:    Renee Blake, is a 85 y.o. female, DOB - 05/12/1928, [id number]  Admit date - 03/04/2019   Admitting Physician Murlean Iba, MD  Outpatient Primary MD for the patient is Renee Squibb, MD  LOS - 1   Chief Complaint  Patient presents with   Shortness of Breath        Subjective:    Renee Blake today has no fevers, no emesis,  No chest pain,   -No significant shortness of breath at rest, dyspnea on exertion persist -Daughter at bedside, questions answered  Assessment  & Plan :    Active Problems:   H/o Prior Lower GI bleed (anorectal Ca)   Malnutrition of moderate degree (HCC)   Generalized weakness   Dyspnea   AKI (acute kidney injury) ([place])   Chronic renal failure, stage 3b   Acute respiratory failure with hypoxia (HCC)   Bilateral pleural effusion   Chronic atrial fibrillation (HCC)   DNR (do not resuscitate)  Brief Summary:-  85 y.o. female with medical history significant for chronic atrial fibrillation that is not anticoagulated due to GI bleeding, chronic anemia with history of severe life threatening GI bleed, hypertension, GERD, gastric ulcer readmitted on 03/14/2019 with dyspnea and CHF exacerbation in the setting of left-sided pleural effusion  A/p 1)HFpEF--admitted with acute on chronic dCHF exacerbation -Diuresis is challenging due to soft BP -Dyspnea, hypoxia and tachypnea improving slowly -Fluid input and output charting  2)Bil pleural effusions--left more than right, status post thoracentesis on 03/04/2019 with removal of 320 mL of fluid from left pleural space--fluid studies pending  3)AKI----acute kidney injury on CKD stage- III--worsening renal function in the setting of CHF and poor renal perfusion     creatinine on admission= 1.82 ,   baseline creatinine =1.3    , creatinine is now=1.52      , renally adjust medications, avoid  nephrotoxic agents / dehydration /hypotension -Watch renal function closely with diuresis  4) chronic atrial fibrillation--- rate control improving, BP soft, decrease metoprolol to 50 mg daily, decrease Cardizem CD 120 mg daily  5) acute hypoxic respiratory failure--- secondary to diastolic CHF exacerbation, treat as above #1   Disposition/Need for in-Hospital Stay- patient unable to be discharged at this time due to -acute CHF requiring IV diuresis  Code Status : DNR  Family Communication:   (patient is alert, awake and coherent) Discussed with daughter Renee Blake  Disposition Plan  : Home with [facility]  Consults  : Radiology for left-sided Thoracentenmsis  DVT Prophylaxis  :   - Heparin - SCDs   Lab Results  Component Value Date   PLT 289 03/05/2019    Inpatient Medications  Scheduled Meds:  amiodarone  200 mg Oral Daily   [START ON 03/06/2019] diltiazem  120 mg Oral Daily   feeding supplement (ENSURE ENLIVE)  237 mL Oral BID BM   furosemide  20 mg Intravenous Q12H   heparin  5,000 Units Subcutaneous Q8H   loratadine  10 mg Oral QPM   [START ON 03/06/2019] metoprolol succinate  50 mg Oral Daily   pantoprazole  40 mg Oral Daily   Continuous Infusions: PRN Meds:.acetaminophen **OR** acetaminophen, ALPRAZolam, dicyclomine, ondansetron **OR** ondansetron (ZOFRAN) IV, senna-docusate, traZODone  Anti-infectives (From admission, onward)   None        Objective:   Vitals:   03/04/19 2109 03/05/19 0449 03/05/19 0500 03/05/19 1357  BP: 102/79 103/68  96/65  Pulse: 81 (!) 47  72  Resp: 18 20  19   Temp: (!) 97.4 F (36.3 C) 97.6 F (36.4 C)  (!) 97.5 F (36.4 C)  TempSrc: Oral Oral  Oral  SpO2: 100% 99%  93%  Weight:   52.9 kg   Height:        Wt Readings from Last 3 Encounters:  03/05/19 52.9 kg  03/02/19 51.5 kg  02/18/19 51.7 kg    No intake or output data in the 24 hours ending 03/05/19 1525   Physical Exam  Gen:- Awake Alert, conversational  dyspnea HEENT:- [place].AT, No sclera icterus Nose-  2L/min Neck-Supple Neck,No JVD,.  Lungs-diminished in bases bilaterally no wheezing CV- S1, S2 normal, irregular  Abd-  +ve B.Sounds, Abd Soft, No tenderness,    Extremity/Skin:- No  edema, pedal pulses present  Psych-affect is appropriate, oriented x3 Neuro-no new focal deficits, no tremors   Data Review:   Micro Results Recent Results (from the past 240 hour(s))  SARS CORONAVIRUS 2 (TAT 6-24 HRS) Nasopharyngeal Nasopharyngeal Swab     Status: None   Collection Time: 03/02/19  4:12 AM   Specimen: Nasopharyngeal Swab  Result Value Ref Range Status   SARS Coronavirus 2 NEGATIVE NEGATIVE Final    Comment: (NOTE) SARS-CoV-2 target nucleic acids are NOT DETECTED. The SARS-CoV-2 RNA is generally detectable in upper and lower respiratory specimens during the acute phase of infection. Negative results do not preclude SARS-CoV-2 infection, do not rule out co-infections with other pathogens, and should not be used as the sole basis for treatment or other patient management decisions. Negative results must be combined with clinical observations, patient history, and epidemiological information. The expected result is Negative. Fact Sheet for Patients: SugarRoll.be Fact Sheet for Healthcare Providers: https://www.woods-mathews.com/ This test is not yet approved or cleared by the [place] FDA and  has been authorized for detection and/or diagnosis of SARS-CoV-2 by FDA under an Emergency Use Authorization (EUA). This EUA will remain  in effect (meaning this test can be used) for the duration of the COVID-19 declaration under Section 56 4(b)(1) of the Act, 21 U.S.C. section 360bbb-3(b)(1), unless the authorization is terminated or revoked sooner. Performed at [facility], [place] [address], [place], [place] [address]   SARS CORONAVIRUS 2 (TAT 6-24 HRS) Nasopharyngeal Nasopharyngeal Swab      Status: None   Collection Time: 03/04/19  3:52 AM   Specimen: Nasopharyngeal Swab  Result Value Ref Range Status   SARS Coronavirus 2 NEGATIVE NEGATIVE Final    Comment: (NOTE) SARS-CoV-2 target nucleic acids are NOT DETECTED. The SARS-CoV-2 RNA is generally detectable in upper and lower respiratory specimens during the acute phase of infection. Negative results do not preclude SARS-CoV-2 infection, do not rule out co-infections with other pathogens, and should not be used as the sole basis for treatment or other patient management decisions. Negative results must be combined with clinical observations, patient history, and epidemiological information. The expected result is Negative. Fact Sheet for Patients: SugarRoll.be Fact Sheet for Healthcare Providers: https://www.woods-mathews.com/ This test is not yet approved or cleared by the [place] FDA and  has been authorized for detection and/or diagnosis of SARS-CoV-2 by FDA under an Emergency Use Authorization (EUA). This EUA will remain  in effect (meaning this test can  be used) for the duration of the COVID-19 declaration under Section 56 4(b)(1) of the Act, 21 U.S.C. section 360bbb-3(b)(1), unless the authorization is terminated or revoked sooner. Performed at [facility], [place] [address] [place], [place] [address]   Culture, body fluid-bottle     Status: None (Preliminary result)   Collection Time: 03/04/19  2:15 PM   Specimen: Pleura  Result Value Ref Range Status   Specimen Description PLEURAL  Final   Special Requests BOTTLES DRAWN AEROBIC AND ANAEROBIC 10 CC EACH  Final   Culture   Final    NO GROWTH < 24 HOURS Performed at [facility], [address], [place], [place] [address]    Report Status PENDING  Incomplete  Gram stain     Status: None   Collection Time: 03/04/19  2:16 PM   Specimen: Pleura; Body Fluid  Result Value Ref Range Status   Specimen  Description PLEURAL  Final   Special Requests NONE  Final   Gram Stain   Final    NO ORGANISMS SEEN WBC PRESENT, PREDOMINANTLY MONONUCLEAR CYTOSPIN SMEAR Performed at [facility], [address], [place], [place] [address]    Report Status 03/04/2019 FINAL  Final    Radiology Reports Dg Chest 1 View  Result Date: 03/04/2019 CLINICAL DATA:  LEFT pleural effusion post thoracentesis EXAM: CHEST  1 VIEW COMPARISON:  03/02/2019 FINDINGS: Enlargement of cardiac silhouette with pulmonary vascular congestion. Scattered interstitial infiltrates favor pulmonary edema. Small loculated pleural effusion at lateral LEFT lung base decreased from prior study. No pneumothorax following thoracentesis. Atherosclerotic calcification aorta. Bones demineralized. IMPRESSION: No pneumothorax following LEFT thoracentesis. Enlargement of cardiac silhouette with vascular congestion and mild pulmonary edema, slightly improved from previous study. Electronically Signed   By: Lavonia Dana M.D.   On: 03/04/2019 14:45   Ct Angio Chest Pe W And/or Wo Contrast  Result Date: 03/04/2019 CLINICAL DATA:  PE suspected, high pretest probability. Shortness of breath. Atrial fibrillation. Patient discharged from [facility] yesterday for treatment of congestive heart failure. EXAM: CT ANGIOGRAPHY CHEST WITH CONTRAST TECHNIQUE: Multidetector CT imaging of the chest was performed using the standard protocol during bolus administration of intravenous contrast. Multiplanar CT image reconstructions and MIPs were obtained to evaluate the vascular anatomy. CONTRAST:  147mL OMNIPAQUE IOHEXOL 350 MG/ML SOLN COMPARISON:  One-view chest x-ray 03/02/2019. CTA chest 08/01/2017 FINDINGS: Cardiovascular: The heart is mildly enlarged. Mild atherosclerotic changes are noted in the aorta without aneurysm. Great vessel origins are within normal limits. Pulmonary artery opacification is excellent. Main pulmonary artery is mildly enlarged, measuring 3.7  cm. No focal filling defects are present to suggest pulmonary emboli. Branch vessels are within normal limits. Mediastinum/Nodes: No significant mediastinal, hilar, or axillary adenopathy is present. Lungs/Pleura: Extensive pleural thickening is present throughout the right lung. Pleural nodular disease is present in the right lower lobe with soft tissue adjacent to the superior segment measuring up to 1.5 cm. Patchy nodular densities are present in the upper lobes bilaterally. 8 mm focal nodule in the right upper lobe is pleural based and stable. Bilateral layering effusions are present, left greater than right. There is partial collapse of the lower lobes bilaterally, left greater than right. Upper Abdomen: No acute abnormality. Musculoskeletal: No chest wall abnormality. No acute or significant osseous findings. Review of the MIP images confirms the above findings. IMPRESSION: 1. No pulmonary embolus. 2. Mild enlargement of the main pulmonary artery suggesting pulmonary arterial hypertension. 3. Extensive pleural thickening throughout the right lung.  This is new from the prior exam. It may be related to the acute effusions and reason pulmonary disease. Recommend follow-up CT of the chest with contrast after treatment of acute disease and volume overload. 4. Bilateral pleural effusions, left greater than right. 5. Bilateral lower lobe collapse, left greater than right. 6. Aortic Atherosclerosis (ICD10-I70.0). 7. Electronically Signed   By: San Morelle M.D.   On: 03/04/2019 05:30   Xr Chest Portable  Result Date: 03/02/2019 CLINICAL DATA:  AFib, shortness of breath EXAM: PORTABLE CHEST 1 VIEW COMPARISON:  January 31, 2019 FINDINGS: There is cardiomegaly. Increased interstitial markings are now seen throughout both lungs. There is blunting of the bilateral costophrenic angles which could be due to trace bilateral pleural effusions. No acute osseous abnormality. IMPRESSION: Cardiomegaly and interstitial  edema Probable trace bilateral pleural effusions Electronically Signed   By: Prudencio Pair M.D.   On: 03/02/2019 03:31   US Thoracentesis Asp Pleural Space W/img Guide  Addendum Date: 03/04/2019   ADDENDUM REPORT: 03/04/2019 15:07 ADDENDUM: Error in initial report. Removed LEFT pleural fluid was YELLOW, not serosanguinous. Remainder of report correct as dictated. Electronically Signed   By: Lavonia Dana M.D.   On: 03/04/2019 15:07   Result Date: 03/04/2019 INDICATION: LEFT pleural effusion EXAM: ULTRASOUND GUIDED DIAGNOSTIC LEFT THORACENTESIS MEDICATIONS: None COMPLICATIONS: None immediate PROCEDURE: An ultrasound guided thoracentesis was thoroughly discussed with the patient and questions answered. The benefits, risks, alternatives and complications were also discussed. The patient understands and wishes to proceed with the procedure. Written consent was obtained. Ultrasound was performed to localize and mark an adequate pocket of fluid in the LEFT chest. The area was then prepped and draped in the normal sterile fashion. 1% Lidocaine was used for local anesthesia. Under ultrasound guidance a 8 French thoracentesis catheter was introduced. Thoracentesis was performed. The catheter was removed and a dressing applied. FINDINGS: A total of approximately 230 mL of serosanguineous LEFT pleural fluid was removed. Septations are seen within the effusion, appeared multiloculated. Samples were sent to the laboratory as requested by the clinical team. IMPRESSION: Successful ultrasound guided LEFT thoracentesis yielding 230 mL of pleural fluid. Electronically Signed: By: Lavonia Dana M.D. On: 03/04/2019 14:48     CBC Recent Labs  Lab 03/02/19 0124 03/04/19 0417 03/05/19 0441  WBC 8.5 7.1 5.8  HGB 10.2* 10.6* 9.3*  HCT 34.0* 35.7* 31.3*  PLT 277 267 289  MCV 95.8 93.5 94.3  MCH 28.7 27.7 28.0  MCHC 30.0 29.7* 29.7*  RDW 14.1 14.2 14.2  LYMPHSABS  --  1.3  --   MONOABS  --  0.7  --   EOSABS  --  0.1  --    BASOSABS  --  0.0  --     Chemistries  Recent Labs  Lab 03/02/19 0124 03/02/19 0455 03/03/19 0426 03/04/19 0417 03/05/19 0441  NA 138  --  139 139 140  K 4.8  --  4.0 5.0 4.1  CL 103  --  103 101 102  CO2 27  --  25 25 26   GLUCOSE 116*  --  90 120* 93  BUN 21  --  16 25* 20  CREATININE 1.58*  --  1.21* 1.82* 1.52*  CALCIUM 8.7*  --  8.9 9.4 8.9  MG  --  2.0  --   --  2.1  AST 11*  --   --   --   --   ALT 9  --   --   --   --  ALKPHOS 70  --   --   --   --   BILITOT 0.2*  --   --   --   --    ------------------------------------------------------------------------------------------------------------------ No results for input(s): CHOL, HDL, LDLCALC, TRIG, CHOLHDL, LDLDIRECT in the last 72 hours.  Lab Results  Component Value Date   HGBA1C 5.9 (H) 08/23/2014   ------------------------------------------------------------------------------------------------------------------ No results for input(s): TSH, T4TOTAL, T3FREE, THYROIDAB in the last 72 hours.  Invalid input(s): FREET3 ------------------------------------------------------------------------------------------------------------------ No results for input(s): VITAMINB12, FOLATE, FERRITIN, TIBC, IRON, RETICCTPCT in the last 72 hours.  Coagulation profile No results for input(s): INR, PROTIME in the last 168 hours.  No results for input(s): DDIMER in the last 72 hours.  Cardiac Enzymes No results for input(s): CKMB, TROPONINI, MYOGLOBIN in the last 168 hours.  Invalid input(s): CK ------------------------------------------------------------------------------------------------------------------    Component Value Date/Time   BNP 412.0 (H) 03/04/2019 [id number]     Roxan Hockey M.D on 03/05/2019 at 3:25 PM  Go to [email or web address] - for contact info  [facility] - Office  [phone]

## 2019-03-05 NOTE — Evaluation (Signed)
Physical Therapy Evaluation Patient Details Name: Renee Blake MRN: [id number] DOB: 11-22-1928 Today's Date: 03/05/2019   History of Present Illness  Renee Blake is a 85 y.o. female with medical history significant for chronic atrial fibrillation that is not anticoagulated due to GI bleeding, chronic anemia with history of severe life threatening GI bleed, hypertension, GERD, gastric ulcer, who was recently discharged after an admission for atrial fibrillation with RVR returns to ED with symptoms of recurrent shortness of breath.  She reported that after she had arrived home she initially felt fine but shortly afterwards began to have progressive symptoms of shortness of breath. She was very concerned that it was an occurrence of her rapid atrial fibrillation but she did not feel chest pain or palpitations.  She has some symptoms of burning in the chest and acid reflux type symptoms.  Nothing seemed to help her shortness of breath.  She does not use oxygen at home.  Her sister had her returned to the ER for evaluation.    Clinical Impression  Patient functioning near baseline for functional mobility and gait, demonstrates slow labored cadence with decreased arm swing with no loss of balance and can go up/down steps safely using 1 side rail.  Patient on room air during gait training with SpO2 dropping from 93% to 77% while on room air, patient put back on 2 LPM with SpO2 increasing to 97% - RN notified.  Patient will benefit from continued physical therapy in hospital and recommended venue below to increase strength, balance, endurance for safe ADLs and gait.     Follow Up Recommendations Home health PT;Supervision - Intermittent    Equipment Recommendations  None recommended by PT    Recommendations for Other Services       Precautions / Restrictions Precautions Precautions: Fall Restrictions Weight Bearing Restrictions: No      Mobility  Bed Mobility Overal bed mobility:  Modified Independent             General bed mobility comments: slightly increased time  Transfers Overall transfer level: Needs assistance Equipment used: None Transfers: Sit to/from Stand;Stand Pivot Transfers Sit to Stand: Modified independent (Device/Increase time);Supervision Stand pivot transfers: Modified independent (Device/Increase time);Supervision       General transfer comment: slightly labored movement  Ambulation/Gait Ambulation/Gait assistance: Supervision Gait Distance (Feet): 75 Feet Assistive device: None Gait Pattern/deviations: Decreased step length - right;Decreased step length - left;Decreased stride length Gait velocity: decreased   General Gait Details: slightly labored slow cadence with decreased arm swing, no loss of balance, limited mostly due to fatigue and SpO2 dropping from 93 to 77% while on room air  Stairs Stairs: Yes Stairs assistance: Supervision;Modified independent (Device/Increase time) Stair Management: One rail Right;Alternating pattern Number of Stairs: 3 General stair comments: demonstrates good return for going up down steps in stair with slightly labored slow movement, no loss of balance  Wheelchair Mobility    Modified Rankin (Stroke Patients Only)       Balance Overall balance assessment: Mild deficits observed, not formally tested                                           Pertinent Vitals/Pain Pain Assessment: No/denies pain    Home Living Family/patient expects to be discharged to:: Private residence Living Arrangements: Children Available Help at Discharge: Family;Friend(s);Available 24 hours/day Type of Home: Apartment Home Access: Stairs  to enter Entrance Stairs-Rails: None Entrance Stairs-Number of Steps: 2 Home Layout: One level Home Equipment: None      Prior Function Level of Independence: Independent         Comments: Publishing rights manager Dominance   Dominant  Hand: Right    Extremity/Trunk Assessment   Upper Extremity Assessment Upper Extremity Assessment: Overall WFL for tasks assessed    Lower Extremity Assessment Lower Extremity Assessment: Generalized weakness    Cervical / Trunk Assessment Cervical / Trunk Assessment: Normal  Communication   Communication: No difficulties  Cognition Arousal/Alertness: Awake/alert Behavior During Therapy: WFL for tasks assessed/performed Overall Cognitive Status: Within Functional Limits for tasks assessed                                        General Comments      Exercises     Assessment/Plan    PT Assessment Patient needs continued PT services  PT Problem List Decreased strength;Decreased activity tolerance;Decreased balance;Decreased mobility       PT Treatment Interventions Balance training;Gait training;Stair training;Functional mobility training;Therapeutic activities;Therapeutic exercise;Patient/family education    PT Goals (Current goals can be found in the Care Plan section)  Acute Rehab PT Goals Patient Stated Goal: return home with family to assist PT Goal Formulation: With patient Time For Goal Achievement: 03/09/19 Potential to Achieve Goals: Good    Frequency Min 2X/week   Barriers to discharge        Co-evaluation               AM-PAC PT "6 Clicks" Mobility  Outcome Measure Help needed turning from your back to your side while in a flat bed without using bedrails?: None Help needed moving from lying on your back to sitting on the side of a flat bed without using bedrails?: None Help needed moving to and from a bed to a chair (including a wheelchair)?: A Little Help needed standing up from a chair using your arms (e.g., wheelchair or bedside chair)?: None Help needed to walk in hospital room?: A Little Help needed climbing 3-5 steps with a railing? : A Little 6 Click Score: 21    End of Session   Activity Tolerance: Patient tolerated  treatment well;Patient limited by fatigue Patient left: in chair;with call bell/phone within reach Nurse Communication: Mobility status PT Visit Diagnosis: Unsteadiness on feet (R26.81);Other abnormalities of gait and mobility (R26.89);Muscle weakness (generalized) (M62.81)    Time: [id number] PT Time Calculation (min) (ACUTE ONLY): 26 min   Charges:   PT Evaluation $PT Eval Moderate Complexity: 1 Mod PT Treatments $Therapeutic Activity: 23-37 mins        8:55 AM, 03/05/19 Lonell Grandchild, MPT Physical Therapist with [facility] [phone] office [phone] mobile phone

## 2019-03-05 NOTE — Plan of Care (Signed)
  Problem: Acute Rehab PT Goals(only PT should resolve) Goal: Pt Will Go Supine/Side To Sit Outcome: Progressing Flowsheets (Taken 03/05/2019 0857) Pt will go Supine/Side to Sit: Independently Goal: Patient Will Transfer Sit To/From Stand Outcome: Progressing Flowsheets (Taken 03/05/2019 0857) Patient will transfer sit to/from stand: Independently Goal: Pt Will Transfer Bed To Chair/Chair To Bed Outcome: Progressing Flowsheets (Taken 03/05/2019 0857) Pt will Transfer Bed to Chair/Chair to Bed: Independently Goal: Pt Will Ambulate Outcome: Progressing Flowsheets (Taken 03/05/2019 0857) Pt will Ambulate:  > 125 feet  with modified independence   8:57 AM, 03/05/19 Lonell Grandchild, MPT Physical Therapist with [facility] [phone] office [phone] mobile phone

## 2019-03-06 DIAGNOSIS — N179 Acute kidney failure, unspecified: Secondary | ICD-10-CM

## 2019-03-06 DIAGNOSIS — K922 Gastrointestinal hemorrhage, unspecified: Secondary | ICD-10-CM

## 2019-03-06 LAB — BASIC METABOLIC PANEL
Anion gap: 11 (ref 5–15)
BUN: 18 mg/dL (ref 8–23)
CO2: 27 mmol/L (ref 22–32)
Calcium: 8.8 mg/dL — ABNORMAL LOW (ref 8.9–10.3)
Chloride: 103 mmol/L (ref 98–111)
Creatinine, Ser: 1.41 mg/dL — ABNORMAL HIGH (ref 0.44–1.00)
GFR calc Af Amer: 38 mL/min — ABNORMAL LOW (ref >60)
GFR calc non Af Amer: 33 mL/min — ABNORMAL LOW (ref >60)
Glucose, Bld: 91 mg/dL (ref 70–99)
Potassium: 3.4 mmol/L — ABNORMAL LOW (ref 3.5–5.1)
Sodium: 141 mmol/L (ref 135–145)

## 2019-03-06 LAB — MAGNESIUM: Magnesium: 2 mg/dL (ref 1.7–2.4)

## 2019-03-06 LAB — CBC
HCT: 31.5 % — ABNORMAL LOW (ref 36.0–46.0)
Hemoglobin: 9.5 g/dL — ABNORMAL LOW (ref 12.0–15.0)
MCH: 28.4 pg (ref 26.0–34.0)
MCHC: 30.2 g/dL (ref 30.0–36.0)
MCV: 94.3 fL (ref 80.0–100.0)
Platelets: 276 10*3/uL (ref 150–400)
RBC: 3.34 MIL/uL — ABNORMAL LOW (ref 3.87–5.11)
RDW: 14 % (ref 11.5–15.5)
WBC: 4.6 10*3/uL (ref 4.0–10.5)
nRBC: 0 % (ref 0.0–0.2)

## 2019-03-06 LAB — PHOSPHORUS: Phosphorus: 3.9 mg/dL (ref 2.5–4.6)

## 2019-03-06 MED ORDER — POTASSIUM CHLORIDE CRYS ER 20 MEQ PO TBCR
40.0000 meq | EXTENDED_RELEASE_TABLET | ORAL | Status: AC
  Administered 2019-03-06 (×2): 40 meq via ORAL
  Filled 2019-03-06 (×2): qty 2

## 2019-03-06 NOTE — Progress Notes (Signed)
Patient Demographics:    Renee Blake, is a 85 y.o. female, DOB - 1929-02-26, [id number]  Admit date - 03/04/2019   Admitting Physician Murlean Iba, MD  Outpatient Primary MD for the patient is [name], MD  LOS - 2  Chief Complaint  Patient presents with  . Shortness of Breath       Subjective:    Renee Blake today has no fevers, no emesis,  No chest pain,    -Some dyspnea on exertion persists -No productive cough -Daughter Marlowe Kays at bedside, questions answered RN Lilia Pro at bedside -O2 sats on room air at rest 94 to 95% range    Assessment  & Plan :    Active Problems:   H/o Prior Lower GI bleed (anorectal Ca)   Malnutrition of moderate degree (HCC)   Generalized weakness   Dyspnea   AKI (acute kidney injury) ([place])   Chronic renal failure, stage 3b   Acute respiratory failure with hypoxia (HCC)   Bilateral pleural effusion   Chronic atrial fibrillation (HCC)   DNR (do not resuscitate)  Brief Summary:-  85 y.o. female with medical history significant for chronic atrial fibrillation that is not anticoagulated due to GI bleeding, chronic anemia with history of severe life threatening GI bleed, hypertension, GERD, gastric ulcer readmitted on 03/14/2019 with dyspnea and CHF exacerbation in the setting of left-sided pleural effusion  A/p 1)HFpEF--admitted with acute on chronic dCHF exacerbation -Diuresis is challenging due to soft BP -Dyspnea and tachypnea improving  -Hypoxia at rest appears to have resolved, patient remains symptomatic with activity -Continue Lasix 20 mg every 12 -Fluid input and output charting  2)Bil pleural effusions--left more than right, status post thoracentesis on 03/04/2019 with removal of 320 mL of fluid from left pleural space--fluid studies pending -Plan on repeat chest x-ray on 03/07/2019  3)AKI----acute kidney injury on CKD stage-  III--worsening renal function in the setting of CHF and poor renal perfusion     creatinine on admission= 1.82 ,   baseline creatinine =1.3    , creatinine is now=1.41     , renally adjust medications, avoid nephrotoxic agents / dehydration /hypotension -Watch renal function closely with diuresis  4) chronic atrial fibrillation--- rate control improving, BP soft, continue metoprolol at reduced dose of 50 mg daily, continue Cardizem CD at a reduced dose of 120 mg daily  5) acute hypoxic respiratory failure--- secondary to diastolic CHF exacerbation, treat as above #1 -Hypoxia at rest appears to have resolved, remains asymptomatic with activity  6) chronic anemia--- hemoglobin 9.5 which is not far from patient's recent baseline -No evidence of active bleeding at this time   Disposition/Need for in-Hospital Stay- patient unable to be discharged at this time due to -acute CHF requiring IV diuresis  Code Status : DNR  Family Communication:   (patient is alert, awake and coherent) Discussed with daughter Marlowe Kays  Disposition Plan  : [facility]  Consults  : Radiology for left-sided Thoracentenmsis  DVT Prophylaxis  :   - Heparin - SCDs   Lab Results  Component Value Date   PLT 276 03/06/2019    Inpatient Medications  Scheduled Meds: . amiodarone  200 mg Oral Daily  . diltiazem  120 mg Oral Daily  .  feeding supplement (ENSURE ENLIVE)  237 mL Oral BID BM  . furosemide  20 mg Intravenous Q12H  . heparin  5,000 Units Subcutaneous Q8H  . loratadine  10 mg Oral QPM  . metoprolol succinate  50 mg Oral Daily  . pantoprazole  40 mg Oral Daily  . potassium chloride  40 mEq Oral Q3H   Continuous Infusions: PRN Meds:.acetaminophen **OR** acetaminophen, ALPRAZolam, dicyclomine, ondansetron **OR** ondansetron (ZOFRAN) IV, senna-docusate, traZODone    Anti-infectives (From admission, onward)   None        Objective:   Vitals:   03/05/19 2119 03/06/19 0557 03/06/19 0836 03/06/19  1430  BP: 114/72 114/81 116/69 108/62  Pulse: 73 78 73 90  Resp: 18 18  18   Temp: 97.6 F (36.4 C) 97.7 F (36.5 C) (!) 97.5 F (36.4 C) 97.8 F (36.6 C)  TempSrc: Oral     SpO2: 100% 99% 100% 98%  Weight:  50.1 kg    Height:        Wt Readings from Last 3 Encounters:  03/06/19 50.1 kg  03/02/19 51.5 kg  02/18/19 51.7 kg     Intake/Output Summary (Last 24 hours) at 03/06/2019 1729 Last data filed at 03/06/2019 0557 Gross per 24 hour  Intake -  Output 600 ml  Net -600 ml     Physical Exam  Gen:- Awake Alert, symptomatic with activity (dyspnea and tachypnea)  HEENT:- [place].AT, No sclera icterus Nose- [place] 2L/min with activity Neck-Supple Neck,No JVD,.  Lungs-improving air movement, no rhonchi CV- S1, S2 normal, irregular  Abd-  +ve B.Sounds, Abd Soft, No tenderness,    Extremity/Skin:- No  edema, pedal pulses present  Psych-affect is appropriate, oriented x3 Neuro-generalized weakness, no new focal deficits, no tremors   Data Review:   Micro Results Recent Results (from the past 240 hour(s))  SARS CORONAVIRUS 2 (TAT 6-24 HRS) Nasopharyngeal Nasopharyngeal Swab     Status: None   Collection Time: 03/02/19  4:12 AM   Specimen: Nasopharyngeal Swab  Result Value Ref Range Status   SARS Coronavirus 2 NEGATIVE NEGATIVE Final    Comment: (NOTE) SARS-CoV-2 target nucleic acids are NOT DETECTED. The SARS-CoV-2 RNA is generally detectable in upper and lower respiratory specimens during the acute phase of infection. Negative results do not preclude SARS-CoV-2 infection, do not rule out co-infections with other pathogens, and should not be used as the sole basis for treatment or other patient management decisions. Negative results must be combined with clinical observations, patient history, and epidemiological information. The expected result is Negative. Fact Sheet for Patients: SugarRoll.be Fact Sheet for Healthcare Providers:  https://www.woods-mathews.com/ This test is not yet approved or cleared by the [place] FDA and  has been authorized for detection and/or diagnosis of SARS-CoV-2 by FDA under an Emergency Use Authorization (EUA). This EUA will remain  in effect (meaning this test can be used) for the duration of the COVID-19 declaration under Section 56 4(b)(1) of the Act, 21 U.S.C. section 360bbb-3(b)(1), unless the authorization is terminated or revoked sooner. Performed at [facility], [place] [address] [place], [place] [address]   SARS CORONAVIRUS 2 (TAT 6-24 HRS) Nasopharyngeal Nasopharyngeal Swab     Status: None   Collection Time: 03/04/19  3:52 AM   Specimen: Nasopharyngeal Swab  Result Value Ref Range Status   SARS Coronavirus 2 NEGATIVE NEGATIVE Final    Comment: (NOTE) SARS-CoV-2 target nucleic acids are NOT DETECTED. The SARS-CoV-2 RNA is generally detectable in upper and lower respiratory specimens  during the acute phase of infection. Negative results do not preclude SARS-CoV-2 infection, do not rule out co-infections with other pathogens, and should not be used as the sole basis for treatment or other patient management decisions. Negative results must be combined with clinical observations, patient history, and epidemiological information. The expected result is Negative. Fact Sheet for Patients: SugarRoll.be Fact Sheet for Healthcare Providers: https://www.woods-mathews.com/ This test is not yet approved or cleared by the [place] FDA and  has been authorized for detection and/or diagnosis of SARS-CoV-2 by FDA under an Emergency Use Authorization (EUA). This EUA will remain  in effect (meaning this test can be used) for the duration of the COVID-19 declaration under Section 56 4(b)(1) of the Act, 21 U.S.C. section 360bbb-3(b)(1), unless the authorization is terminated or revoked sooner. Performed at [facility], [place] [address], [place], [place] [address]   Culture, body fluid-bottle     Status: None (Preliminary result)   Collection Time: 03/04/19  2:15 PM   Specimen: Pleura  Result Value Ref Range Status   Specimen Description PLEURAL  Final   Special Requests BOTTLES DRAWN AEROBIC AND ANAEROBIC 10 CC EACH  Final   Culture   Final    NO GROWTH 2 DAYS Performed at [facility], [address] [place], [place] [address]    Report Status PENDING  Incomplete  Gram stain     Status: None   Collection Time: 03/04/19  2:16 PM   Specimen: Pleura; Body Fluid  Result Value Ref Range Status   Specimen Description PLEURAL  Final   Special Requests NONE  Final   Gram Stain   Final    NO ORGANISMS SEEN WBC PRESENT, PREDOMINANTLY MONONUCLEAR CYTOSPIN SMEAR Performed at [facility], [address] [place], [place] [address]    Report Status 03/04/2019 FINAL  Final    Radiology Reports Dg Chest 1 View  Result Date: 03/04/2019 CLINICAL DATA:  LEFT pleural effusion post thoracentesis EXAM: CHEST  1 VIEW COMPARISON:  03/02/2019 FINDINGS: Enlargement of cardiac silhouette with pulmonary vascular congestion. Scattered interstitial infiltrates favor pulmonary edema. Small loculated pleural effusion at lateral LEFT lung base decreased from prior study. No pneumothorax following thoracentesis. Atherosclerotic calcification aorta. Bones demineralized. IMPRESSION: No pneumothorax following LEFT thoracentesis. Enlargement of cardiac silhouette with vascular congestion and mild pulmonary edema, slightly improved from previous study. Electronically Signed   By: Lavonia Dana M.D.   On: 03/04/2019 14:45   Ct Angio Chest Pe W And/or Wo Contrast  Result Date: 03/04/2019 CLINICAL DATA:  PE suspected, high pretest probability. Shortness of breath. Atrial fibrillation. Patient discharged from [facility] yesterday for treatment of congestive heart failure. EXAM: CT ANGIOGRAPHY CHEST WITH CONTRAST TECHNIQUE:  Multidetector CT imaging of the chest was performed using the standard protocol during bolus administration of intravenous contrast. Multiplanar CT image reconstructions and MIPs were obtained to evaluate the vascular anatomy. CONTRAST:  153mL OMNIPAQUE IOHEXOL 350 MG/ML SOLN COMPARISON:  One-view chest x-ray 03/02/2019. CTA chest 08/01/2017 FINDINGS: Cardiovascular: The heart is mildly enlarged. Mild atherosclerotic changes are noted in the aorta without aneurysm. Great vessel origins are within normal limits. Pulmonary artery opacification is excellent. Main pulmonary artery is mildly enlarged, measuring 3.7 cm. No focal filling defects are present to suggest pulmonary emboli. Branch vessels are within normal limits. Mediastinum/Nodes: No significant mediastinal, hilar, or axillary adenopathy is present. Lungs/Pleura: Extensive pleural thickening is present throughout the right lung. Pleural nodular disease is present in the right lower lobe with  soft tissue adjacent to the superior segment measuring up to 1.5 cm. Patchy nodular densities are present in the upper lobes bilaterally. 8 mm focal nodule in the right upper lobe is pleural based and stable. Bilateral layering effusions are present, left greater than right. There is partial collapse of the lower lobes bilaterally, left greater than right. Upper Abdomen: No acute abnormality. Musculoskeletal: No chest wall abnormality. No acute or significant osseous findings. Review of the MIP images confirms the above findings. IMPRESSION: 1. No pulmonary embolus. 2. Mild enlargement of the main pulmonary artery suggesting pulmonary arterial hypertension. 3. Extensive pleural thickening throughout the right lung. This is new from the prior exam. It may be related to the acute effusions and reason pulmonary disease. Recommend follow-up CT of the chest with contrast after treatment of acute disease and volume overload. 4. Bilateral pleural effusions, left greater than  right. 5. Bilateral lower lobe collapse, left greater than right. 6. Aortic Atherosclerosis (ICD10-I70.0). 7. Electronically Signed   By: San Morelle M.D.   On: 03/04/2019 05:30   Xr Chest Portable  Result Date: 03/02/2019 CLINICAL DATA:  AFib, shortness of breath EXAM: PORTABLE CHEST 1 VIEW COMPARISON:  January 31, 2019 FINDINGS: There is cardiomegaly. Increased interstitial markings are now seen throughout both lungs. There is blunting of the bilateral costophrenic angles which could be due to trace bilateral pleural effusions. No acute osseous abnormality. IMPRESSION: Cardiomegaly and interstitial edema Probable trace bilateral pleural effusions Electronically Signed   By: Prudencio Pair M.D.   On: 03/02/2019 03:31   US Thoracentesis Asp Pleural Space W/img Guide  Addendum Date: 03/04/2019   ADDENDUM REPORT: 03/04/2019 15:07 ADDENDUM: Error in initial report. Removed LEFT pleural fluid was YELLOW, not serosanguinous. Remainder of report correct as dictated. Electronically Signed   By: Lavonia Dana M.D.   On: 03/04/2019 15:07   Result Date: 03/04/2019 INDICATION: LEFT pleural effusion EXAM: ULTRASOUND GUIDED DIAGNOSTIC LEFT THORACENTESIS MEDICATIONS: None COMPLICATIONS: None immediate PROCEDURE: An ultrasound guided thoracentesis was thoroughly discussed with the patient and questions answered. The benefits, risks, alternatives and complications were also discussed. The patient understands and wishes to proceed with the procedure. Written consent was obtained. Ultrasound was performed to localize and mark an adequate pocket of fluid in the LEFT chest. The area was then prepped and draped in the normal sterile fashion. 1% Lidocaine was used for local anesthesia. Under ultrasound guidance a 8 French thoracentesis catheter was introduced. Thoracentesis was performed. The catheter was removed and a dressing applied. FINDINGS: A total of approximately 230 mL of serosanguineous LEFT pleural fluid was  removed. Septations are seen within the effusion, appeared multiloculated. Samples were sent to the laboratory as requested by the clinical team. IMPRESSION: Successful ultrasound guided LEFT thoracentesis yielding 230 mL of pleural fluid. Electronically Signed: By: Lavonia Dana M.D. On: 03/04/2019 14:48     CBC Recent Labs  Lab 03/02/19 0124 03/04/19 0417 03/05/19 0441 03/06/19 0623  WBC 8.5 7.1 5.8 4.6  HGB 10.2* 10.6* 9.3* 9.5*  HCT 34.0* 35.7* 31.3* 31.5*  PLT 277 267 289 276  MCV 95.8 93.5 94.3 94.3  MCH 28.7 27.7 28.0 28.4  MCHC 30.0 29.7* 29.7* 30.2  RDW 14.1 14.2 14.2 14.0  LYMPHSABS  --  1.3  --   --   MONOABS  --  0.7  --   --   EOSABS  --  0.1  --   --   BASOSABS  --  0.0  --   --  Chemistries  Recent Labs  Lab 03/02/19 0124 03/02/19 0455 03/03/19 0426 03/04/19 0417 03/05/19 0441 03/06/19 0623  NA 138  --  139 139 140 141  K 4.8  --  4.0 5.0 4.1 3.4*  CL 103  --  103 101 102 103  CO2 27  --  25 25 26 27   GLUCOSE 116*  --  90 120* 93 91  BUN 21  --  16 25* 20 18  CREATININE 1.58*  --  1.21* 1.82* 1.52* 1.41*  CALCIUM 8.7*  --  8.9 9.4 8.9 8.8*  MG  --  2.0  --   --  2.1 2.0  AST 11*  --   --   --   --   --   ALT 9  --   --   --   --   --   ALKPHOS 70  --   --   --   --   --   BILITOT 0.2*  --   --   --   --   --    ------------------------------------------------------------------------------------------------------------------ No results for input(s): CHOL, HDL, LDLCALC, TRIG, CHOLHDL, LDLDIRECT in the last 72 hours.  Lab Results  Component Value Date   HGBA1C 5.9 (H) 08/23/2014   ------------------------------------------------------------------------------------------------------------------ No results for input(s): TSH, T4TOTAL, T3FREE, THYROIDAB in the last 72 hours.  Invalid input(s): FREET3 ------------------------------------------------------------------------------------------------------------------ No results for input(s): VITAMINB12,  FOLATE, FERRITIN, TIBC, IRON, RETICCTPCT in the last 72 hours.  Coagulation profile No results for input(s): INR, PROTIME in the last 168 hours.  No results for input(s): DDIMER in the last 72 hours.  Cardiac Enzymes No results for input(s): CKMB, TROPONINI, MYOGLOBIN in the last 168 hours.  Invalid input(s): CK ------------------------------------------------------------------------------------------------------------------    Component Value Date/Time   BNP 412.0 (H) 03/04/2019 [id number]     Roxan Hockey M.D on 03/06/2019 at 5:29 PM  Go to [email or web address] - for contact info  [facility] - Office  [phone]

## 2019-03-07 ENCOUNTER — Inpatient Hospital Stay (HOSPITAL_COMMUNITY): Payer: Medicare Other

## 2019-03-07 LAB — CBC
HCT: 31 % — ABNORMAL LOW (ref 36.0–46.0)
Hemoglobin: 9.4 g/dL — ABNORMAL LOW (ref 12.0–15.0)
MCH: 28.3 pg (ref 26.0–34.0)
MCHC: 30.3 g/dL (ref 30.0–36.0)
MCV: 93.4 fL (ref 80.0–100.0)
Platelets: 281 10*3/uL (ref 150–400)
RBC: 3.32 MIL/uL — ABNORMAL LOW (ref 3.87–5.11)
RDW: 14 % (ref 11.5–15.5)
WBC: 5.1 10*3/uL (ref 4.0–10.5)
nRBC: 0 % (ref 0.0–0.2)

## 2019-03-07 LAB — BASIC METABOLIC PANEL
Anion gap: 11 (ref 5–15)
BUN: 17 mg/dL (ref 8–23)
CO2: 25 mmol/L (ref 22–32)
Calcium: 8.9 mg/dL (ref 8.9–10.3)
Chloride: 106 mmol/L (ref 98–111)
Creatinine, Ser: 1.4 mg/dL — ABNORMAL HIGH (ref 0.44–1.00)
GFR calc Af Amer: 39 mL/min — ABNORMAL LOW (ref >60)
GFR calc non Af Amer: 33 mL/min — ABNORMAL LOW (ref >60)
Glucose, Bld: 91 mg/dL (ref 70–99)
Potassium: 4.1 mmol/L (ref 3.5–5.1)
Sodium: 142 mmol/L (ref 135–145)

## 2019-03-07 IMAGING — DX DG CHEST 2V
2 series · 2 of 2 positions shown · non-contrast
Comparison: [DATE].

CLINICAL DATA: Status post thoracentesis.

EXAM:
CHEST - 2 VIEW

[chest pa]
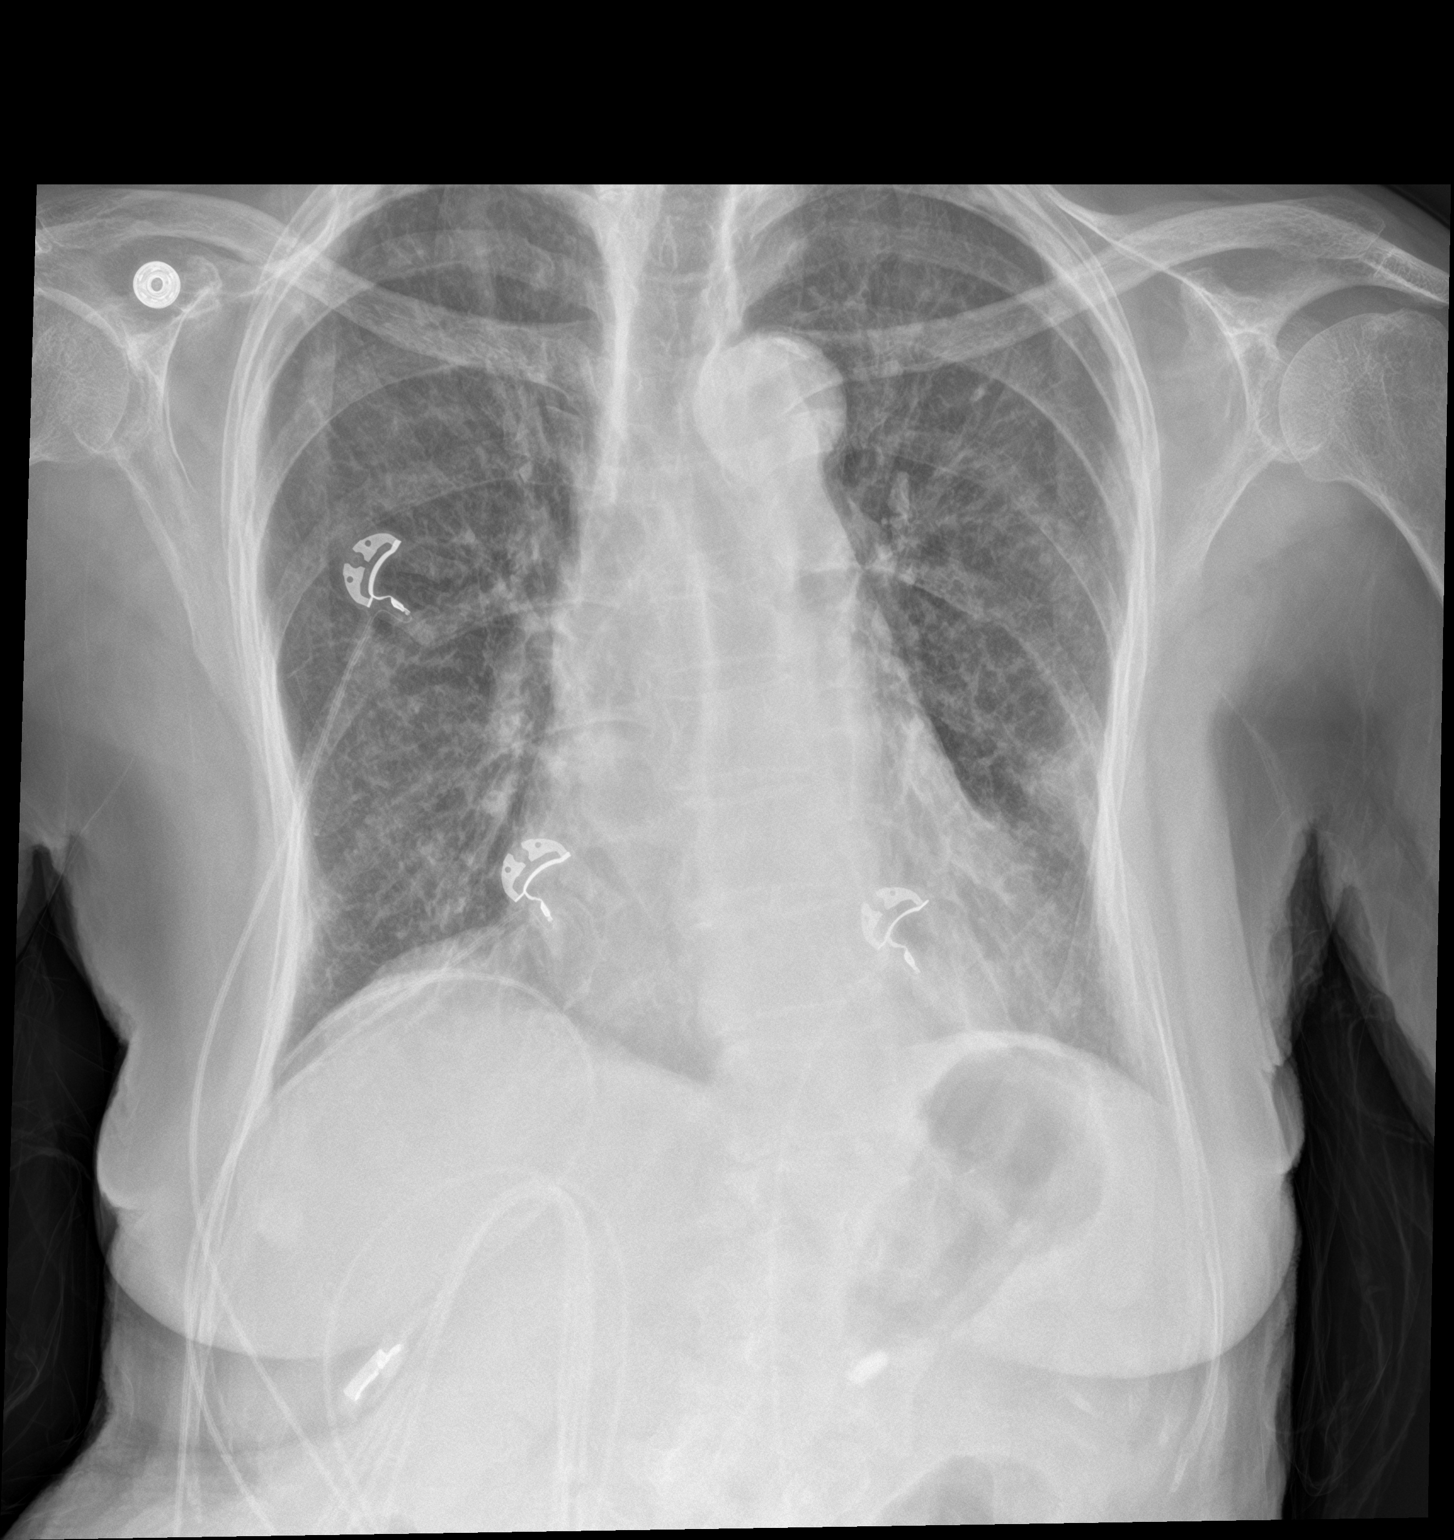

[chest lat]
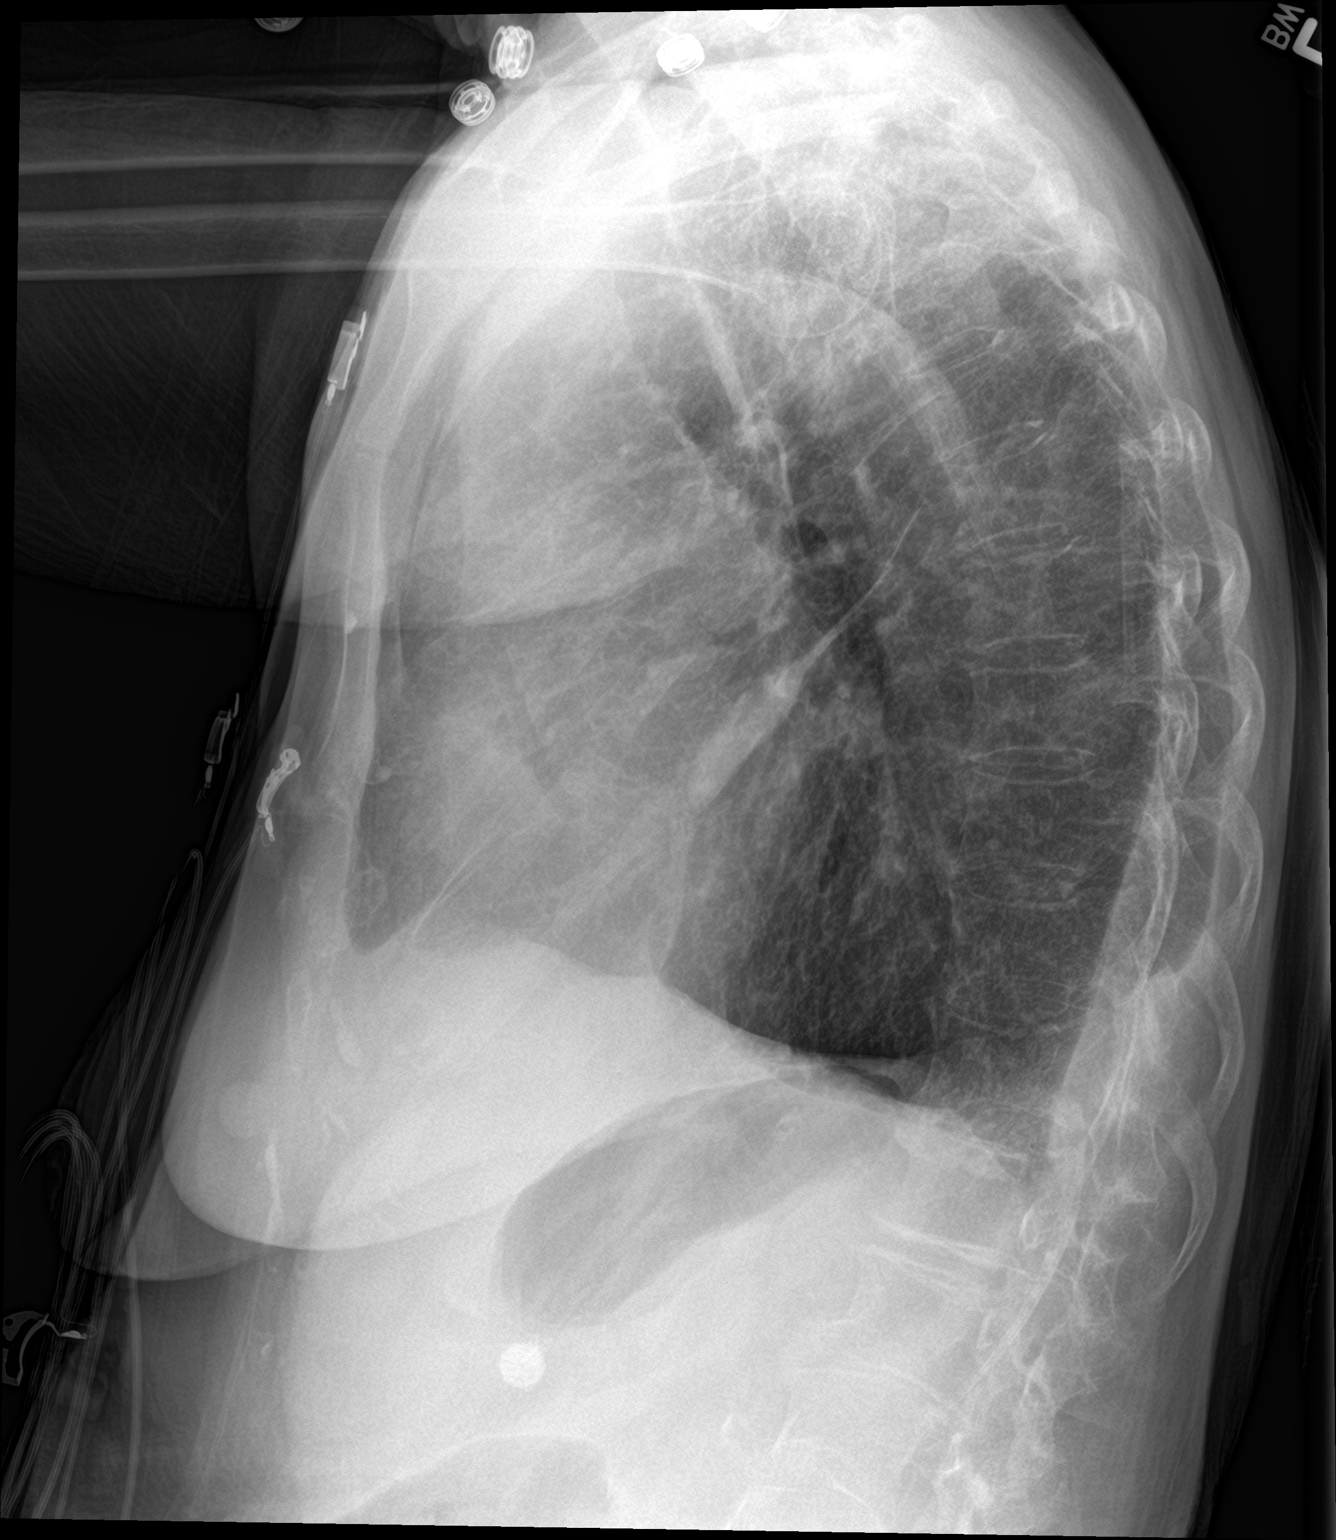

[2 of 2 positions shown; findings below may reference images not displayed]

FINDINGS: Normal heart size. Aortic atherosclerosis. There is been interval
decrease in volume of the left pleural effusion status post
thoracentesis. No appreciable pneumothorax identified. Mild diffuse
increase interstitial markings are identified, unchanged.
IMPRESSION: Mild diffuse edema, similar to previous exam.

Decrease in volume of left pleural effusion status post
thoracentesis.

## 2019-03-07 MED ORDER — METOPROLOL SUCCINATE ER 25 MG PO TB24: 50.0000 mg | ORAL_TABLET | Freq: Every day | ORAL | 3 refills | Status: DC

## 2019-03-07 MED ORDER — AMIODARONE HCL 200 MG PO TABS: 200.0000 mg | ORAL_TABLET | Freq: Every day | ORAL | 3 refills | Status: DC

## 2019-03-07 MED ORDER — FUROSEMIDE 40 MG PO TABS: 40.0000 mg | ORAL_TABLET | Freq: Every day | ORAL | 1 refills | Status: DC

## 2019-03-07 NOTE — Discharge Summary (Signed)
Renee Blake, Renee Blake, is a 85 y.o. female  DOB 06/26/1928  MRN [id number].  Admission date:  03/04/2019  Admitting Physician  Murlean Iba, MD  Discharge Date:  03/07/2019   Primary MD  [name], MD  Recommendations for primary care physician for things to follow:    1)Very low-salt diet advised 2)Weigh yourself daily, call if you gain more than 3 pounds in 1 day or more than 5 pounds in 1 week as your diuretic medications may need to be adjusted 3)Limit your Fluid  intake to no more than 60 ounces (1.8 Liters) per day 4) Toprol-XL/metoprolol has been changed to 50 mg once daily 5) please take Cardizem CD 180 mg daily to keep your heart from going too fast 6) increase Lasix/ furosemide to 40 mg daily for fluid 7) follow-up with your cardiologist Dr. Bronson Ing and with your primary care physician Dr. Nevada Crane within a week for recheck and reevaluation you will need a repeat CBC and BMP blood test at that visit  Admission Diagnosis  Hypoxia [R09.02] Chronic atrial fibrillation ([place]) [I48.20] Acute kidney injury (nontraumatic) ([place]) [N17.9] Bilateral pleural effusion [J90] Elevated brain natriuretic peptide (BNP) level [R79.89] Normochromic normocytic anemia [D64.9]  Discharge Diagnosis  Hypoxia [R09.02] Chronic atrial fibrillation ([place]) [I48.20] Acute kidney injury (nontraumatic) ([place]) [N17.9] Bilateral pleural effusion [J90] Elevated brain natriuretic peptide (BNP) level [R79.89] Normochromic normocytic anemia [D64.9]    Active Problems:   H/o Prior Lower GI bleed (anorectal Ca)   Malnutrition of moderate degree (HCC)   Generalized weakness   Dyspnea   AKI (acute kidney injury) ([place])   Chronic renal failure, stage 3b   Acute respiratory failure with hypoxia (HCC)   Bilateral pleural effusion   Chronic atrial fibrillation ([place])   DNR (do not resuscitate)      Past Medical History:    Diagnosis Date   Acute blood loss anemia    Atrial fibrillation ([place])    a. diagnosed in 12/2018. Rate-control pursued given not a candidate for anticoagulation   Chronic diarrhea    Dilation of biliary tract    biliary duct per 02/26/13 CT   Duodenal stricture    Esophageal stricture    Gastric ulcer    GI bleed    Hyperlipemia    Hypertension    Microscopic colitis    Nephrolithiasis    Pancreatic duct dilated 02/26/13   Schatzki's ring    Skin cancer    Sliding hiatal hernia    Vertigo     Past Surgical History:  Procedure Laterality Date   BIOPSY  07/28/2017   Procedure: BIOPSY;  Surgeon: Rogene Houston, MD;  [facility];  Service: Endoscopy;;  rectum   COLONOSCOPY N/A 07/28/2017   Procedure: COLONOSCOPY;  Surgeon: Rogene Houston, MD;  [facility];  Service: Endoscopy;  Laterality: N/A;   ESOPHAGOGASTRODUODENOSCOPY N/A 02/27/2013   Procedure: ESOPHAGOGASTRODUODENOSCOPY (EGD);  Surgeon: Ladene Artist, MD;  [facility] ENDOSCOPY;  Service: Endoscopy;  Laterality: N/A;  ESOPHAGOGASTRODUODENOSCOPY N/A 03/28/2013   Procedure: ESOPHAGOGASTRODUODENOSCOPY (EGD);  Surgeon: Rogene Houston, MD;  [facility];  Service: Endoscopy;  Laterality: N/A;   HOT HEMOSTASIS N/A 02/27/2013   Procedure: HOT HEMOSTASIS (ARGON PLASMA COAGULATION/BICAP);  Surgeon: Ladene Artist, MD;  Location: [facility] ENDOSCOPY;  Service: Endoscopy;  Laterality: N/A;   NOSE SURGERY     for skin cancer   POLYPECTOMY  07/28/2017   Procedure: POLYPECTOMY;  Surgeon: Rogene Houston, MD;  [facility];  Service: Endoscopy;;  colon        HPI  from the history and physical done on the day of admission:    - Chief Complaint: shortness of breath   HPI: Renee Blake is a 85 y.o. female with medical history significant for chronic atrial fibrillation that is not anticoagulated due to GI bleeding, chronic anemia with history of severe  life threatening GI bleed, hypertension, GERD, gastric ulcer, who was recently discharged after an admission for atrial fibrillation with RVR returns to ED with symptoms of recurrent shortness of breath.  She reported that after she had arrived home she initially felt fine but shortly afterwards began to have progressive symptoms of shortness of breath. She was very concerned that it was an occurrence of her rapid atrial fibrillation but she did not feel chest pain or palpitations.  She has some symptoms of burning in the chest and acid reflux type symptoms.  Nothing seemed to help her shortness of breath.  She does not use oxygen at home.  Her sister had her returned to the ER for evaluation.   ED Course: Pt was hypoxic on arrival and started on nasal cannula oxygen with some improvement in symptoms.  She was tachypneic with RR of 23-29.  Given that she is not anticoagulated she was given a CTA chest with findings of pleural effusions, pleural thickening, atelectasis and volume overload. Her creatinine was mildly elevated at 1.82, glucose was 120, BNP was 412, Hg was 10.6, platelets 267, troponin 5.   Pt was given IV lasix 40 mg and admission was requested.   Review of Systems: As per HPI otherwise 10 point review of systems negative.    Hospital Course:     Brief Summary:- 85 y.o.femalewith medical history significantfor HTN, chronic atrial fibrillation that is not anticoagulated due to GI bleeding, chronic anemia with history of severe life threatening GI bleed, h/o rectal carcinoma with spread to the vagina, status post prior treatment , h/o PUD and HFpEF readmitted on 03/04/2019 with dyspnea and CHF exacerbation in the setting of left-sided pleural effusion  A/p 1)HFpEF--admitted with acute on chronic d2CHF exacerbation -Responded well to IV Lasix and left-sided thoracentesis -Diuresed well, negative fluid balance and weight loss noted -Hypoxia resolved after thoracentesis and  diuresis, -Okay to discharge home on Lasix 40 mg daily -Admission weight 116 lb, discharge weight 110lb  2)Bil pleural effusions--suspect due to congestive heart failure, left more than right, status post thoracentesis on 03/04/2019 with removal of 320 mL of fluid from left pleural space--fluid cytology without atypia, Gram stain and culture NGTD repeat chest x-ray on 03/07/2019 without significant reaccumulation of pleural effusion at this time  3)AKI----acute kidney injury on CKD stage- III--worsening renal function in the setting of CHF and poor renal perfusion     creatinine on admission= 1.82 ,   baseline creatinine =1.3    , creatinine is now=1.40     , renally adjust medications, avoid nephrotoxic agents / dehydration /hypotension -Repeat  BMP with PCP on cardiology  4)Chronic Atrial Fibrillation--- rate control improved - BP improved , continue metoprolol XL at reduced dose of 50 mg daily, -okay to continue Cardizem CD 180 mg daily -Echo with preserved EF of 50 to 65% with grade 2 diastolic dysfunction CHF and severely dilated left atrium--so patient is unlikely to stay in sinus rhythm if she converts -CHA2DS2- VASc score is = 5 (age (2), HTN, CHF, Female)Which is equal to = 7.2% annual risk of stroke -However patientHAS Bledscoreis elevated due to Age ANDhistory of prior Majorbleedingduodenal ulcer and prior lower GI bleed from an a rectal carcinoma with chronic iron deficiency anemia-secondary to presumed chronic ongoing GI blood loss - she is not an ideal candidate for FULL Anticoagulation -Inability to anticoagulate precludes cardioversion  5) acute hypoxic respiratory failure--- secondary to diastolic CHF exacerbation, treat as above #1 -Hypoxia  resolved, remains asymptomatic with activity -Ambulated today without dyspnea on exertion or hypoxia please see RNs ambulation note  6) chronic anemia--- hemoglobin 9.4 which is not far from patient's recent baseline -No  evidence of active bleeding at this time  7)Generalized weakness/deconditioning--discharge home with home health services including PT and RN  8)H/o Anorectal Carcinoma--- S/p prior Radiation-- recent PET scan Noted.Patient gets herradiation oncology, GYN and medical oncology care from [facility] through [facility] -   Disposition/Need for in-Hospital Stay- patient unable to be discharged at this time due to -acute CHF requiring IV diuresis  Code Status : DNR  Family Communication:   (patient is alert, awake and coherent) Discussed with daughter Marlowe Kays  Disposition Plan  : Home with [facility]  Consults  : Radiology for left-sided Thoracentenmsis  Discharge Condition: stable--ambulated without hypoxia or dyspnea on exertion, or dizziness or chest pains  Follow UP--- Dr Bronson Ing and Dr Nevada Crane  Consults obtained -   Diet and Activity recommendation:  As advised  Discharge Instructions    Discharge Instructions    Call MD for:  difficulty breathing, headache or visual disturbances   Complete by: As directed    Call MD for:  persistant dizziness or light-headedness   Complete by: As directed    Call MD for:  persistant nausea and vomiting   Complete by: As directed    Call MD for:  temperature >100.4   Complete by: As directed    Diet - low sodium heart healthy   Complete by: As directed    Discharge instructions   Complete by: As directed    1)Very low-salt diet advised 2)Weigh yourself daily, call if you gain more than 3 pounds in 1 day or more than 5 pounds in 1 week as your diuretic medications may need to be adjusted 3)Limit your Fluid  intake to no more than 60 ounces (1.8 Liters) per day 4) Toprol-XL/metoprolol has been changed to 50 mg once daily 5) please take Cardizem CD 180 mg daily to keep your heart from going too fast 6) increase Lasix/ furosemide to 40 mg daily for fluid 7) follow-up with your cardiologist Dr. Bronson Ing and with your primary care physician Dr. Nevada Crane  within a week for recheck and reevaluation you will need a repeat CBC and BMP blood test at that visit   Increase activity slowly   Complete by: As directed         Discharge Medications     Allergies as of 03/07/2019      Reactions   Clonazepam Other (See Comments)   Pruritis   Codeine Nausea And Vomiting, Other (See Comments)  Reaction unknown   Sulfa Antibiotics Other (See Comments)   Reactions unknown      Medication List    TAKE these medications   acetaminophen 325 MG tablet Commonly known as: TYLENOL Take 2 tablets (650 mg total) by mouth every 6 (six) hours as needed for mild pain, fever or headache (or Fever >/= 101).   ALPRAZolam 0.25 MG tablet Commonly known as: XANAX Take 1 tablet (0.25 mg total) by mouth 2 (two) times daily as needed. for anxiety   amiodarone 200 MG tablet Commonly known as: PACERONE Take 1 tablet (200 mg total) by mouth daily.   dicyclomine 10 MG capsule Commonly known as: BENTYL Take 10 mg by mouth every 4 (four) hours as needed for spasms.   diltiazem 180 MG 24 hr capsule Commonly known as: CARDIZEM CD Take 1 capsule (180 mg total) by mouth daily.   furosemide 40 MG tablet Commonly known as: LASIX Take 1 tablet (40 mg total) by mouth daily. May take additional dose if shortness of breath or fluid buildup What changed:   medication strength  how much to take  additional instructions   levocetirizine 5 MG tablet Commonly known as: XYZAL Take 5 mg by mouth every evening.   meclizine 25 MG tablet Commonly known as: ANTIVERT Take 25 mg by mouth daily as needed for dizziness.   metoprolol succinate 25 MG 24 hr tablet Commonly known as: TOPROL-XL Take 2 tablets (50 mg total) by mouth daily. What changed: how much to take   pantoprazole 40 MG tablet Commonly known as: PROTONIX Take 1 tablet (40 mg total) by mouth daily.   Zofran 4 MG tablet Generic drug: ondansetron Take 4 mg by mouth every 8 (eight) hours as needed for  nausea or vomiting.      Major procedures and Radiology Reports - PLEASE review detailed and final reports for all details, in brief -   Dg Chest 1 View  Result Date: 03/04/2019 CLINICAL DATA:  LEFT pleural effusion post thoracentesis EXAM: CHEST  1 VIEW COMPARISON:  03/02/2019 FINDINGS: Enlargement of cardiac silhouette with pulmonary vascular congestion. Scattered interstitial infiltrates favor pulmonary edema. Small loculated pleural effusion at lateral LEFT lung base decreased from prior study. No pneumothorax following thoracentesis. Atherosclerotic calcification aorta. Bones demineralized. IMPRESSION: No pneumothorax following LEFT thoracentesis. Enlargement of cardiac silhouette with vascular congestion and mild pulmonary edema, slightly improved from previous study. Electronically Signed   By: Lavonia Dana M.D.   On: 03/04/2019 14:45   Cxr  Result Date: 03/07/2019 CLINICAL DATA:  Status post thoracentesis. EXAM: CHEST - 2 VIEW COMPARISON:  03/04/2019. FINDINGS: Normal heart size. Aortic atherosclerosis. There is been interval decrease in volume of the left pleural effusion status post thoracentesis. No appreciable pneumothorax identified. Mild diffuse increase interstitial markings are identified, unchanged. IMPRESSION: Mild diffuse edema, similar to previous exam. Decrease in volume of left pleural effusion status post thoracentesis. Electronically Signed   By: Kerby Moors M.D.   On: 03/07/2019 13:38   Ct Angio Chest Pe W And/or Wo Contrast  Result Date: 03/04/2019 CLINICAL DATA:  PE suspected, high pretest probability. Shortness of breath. Atrial fibrillation. Patient discharged from [facility] yesterday for treatment of congestive heart failure. EXAM: CT ANGIOGRAPHY CHEST WITH CONTRAST TECHNIQUE: Multidetector CT imaging of the chest was performed using the standard protocol during bolus administration of intravenous contrast. Multiplanar CT image reconstructions and MIPs were  obtained to evaluate the vascular anatomy. CONTRAST:  14mL OMNIPAQUE IOHEXOL 350 MG/ML SOLN COMPARISON:  One-view chest x-ray 03/02/2019. CTA chest 08/01/2017 FINDINGS: Cardiovascular: The heart is mildly enlarged. Mild atherosclerotic changes are noted in the aorta without aneurysm. Great vessel origins are within normal limits. Pulmonary artery opacification is excellent. Main pulmonary artery is mildly enlarged, measuring 3.7 cm. No focal filling defects are present to suggest pulmonary emboli. Branch vessels are within normal limits. Mediastinum/Nodes: No significant mediastinal, hilar, or axillary adenopathy is present. Lungs/Pleura: Extensive pleural thickening is present throughout the right lung. Pleural nodular disease is present in the right lower lobe with soft tissue adjacent to the superior segment measuring up to 1.5 cm. Patchy nodular densities are present in the upper lobes bilaterally. 8 mm focal nodule in the right upper lobe is pleural based and stable. Bilateral layering effusions are present, left greater than right. There is partial collapse of the lower lobes bilaterally, left greater than right. Upper Abdomen: No acute abnormality. Musculoskeletal: No chest wall abnormality. No acute or significant osseous findings. Review of the MIP images confirms the above findings. IMPRESSION: 1. No pulmonary embolus. 2. Mild enlargement of the main pulmonary artery suggesting pulmonary arterial hypertension. 3. Extensive pleural thickening throughout the right lung. This is new from the prior exam. It may be related to the acute effusions and reason pulmonary disease. Recommend follow-up CT of the chest with contrast after treatment of acute disease and volume overload. 4. Bilateral pleural effusions, left greater than right. 5. Bilateral lower lobe collapse, left greater than right. 6. Aortic Atherosclerosis (ICD10-I70.0). 7. Electronically Signed   By: San Morelle M.D.   On: 03/04/2019 05:30    Xr Chest Portable  Result Date: 03/02/2019 CLINICAL DATA:  AFib, shortness of breath EXAM: PORTABLE CHEST 1 VIEW COMPARISON:  January 31, 2019 FINDINGS: There is cardiomegaly. Increased interstitial markings are now seen throughout both lungs. There is blunting of the bilateral costophrenic angles which could be due to trace bilateral pleural effusions. No acute osseous abnormality. IMPRESSION: Cardiomegaly and interstitial edema Probable trace bilateral pleural effusions Electronically Signed   By: Prudencio Pair M.D.   On: 03/02/2019 03:31   US Thoracentesis Asp Pleural Space W/img Guide  Addendum Date: 03/04/2019   ADDENDUM REPORT: 03/04/2019 15:07 ADDENDUM: Error in initial report. Removed LEFT pleural fluid was YELLOW, not serosanguinous. Remainder of report correct as dictated. Electronically Signed   By: Lavonia Dana M.D.   On: 03/04/2019 15:07   Result Date: 03/04/2019 INDICATION: LEFT pleural effusion EXAM: ULTRASOUND GUIDED DIAGNOSTIC LEFT THORACENTESIS MEDICATIONS: None COMPLICATIONS: None immediate PROCEDURE: An ultrasound guided thoracentesis was thoroughly discussed with the patient and questions answered. The benefits, risks, alternatives and complications were also discussed. The patient understands and wishes to proceed with the procedure. Written consent was obtained. Ultrasound was performed to localize and mark an adequate pocket of fluid in the LEFT chest. The area was then prepped and draped in the normal sterile fashion. 1% Lidocaine was used for local anesthesia. Under ultrasound guidance a 8 French thoracentesis catheter was introduced. Thoracentesis was performed. The catheter was removed and a dressing applied. FINDINGS: A total of approximately 230 mL of serosanguineous LEFT pleural fluid was removed. Septations are seen within the effusion, appeared multiloculated. Samples were sent to the laboratory as requested by the clinical team. IMPRESSION: Successful ultrasound guided  LEFT thoracentesis yielding 230 mL of pleural fluid. Electronically Signed: By: Lavonia Dana M.D. On: 03/04/2019 14:48    Micro Results   Recent Results (from the past 240 hour(s))  SARS CORONAVIRUS 2 (TAT 6-24 HRS) Nasopharyngeal  Nasopharyngeal Swab     Status: None   Collection Time: 03/02/19  4:12 AM   Specimen: Nasopharyngeal Swab  Result Value Ref Range Status   SARS Coronavirus 2 NEGATIVE NEGATIVE Final    Comment: (NOTE) SARS-CoV-2 target nucleic acids are NOT DETECTED. The SARS-CoV-2 RNA is generally detectable in upper and lower respiratory specimens during the acute phase of infection. Negative results do not preclude SARS-CoV-2 infection, do not rule out co-infections with other pathogens, and should not be used as the sole basis for treatment or other patient management decisions. Negative results must be combined with clinical observations, patient history, and epidemiological information. The expected result is Negative. Fact Sheet for Patients: SugarRoll.be Fact Sheet for Healthcare Providers: https://www.woods-mathews.com/ This test is not yet approved or cleared by the [place] FDA and  has been authorized for detection and/or diagnosis of SARS-CoV-2 by FDA under an Emergency Use Authorization (EUA). This EUA will remain  in effect (meaning this test can be used) for the duration of the COVID-19 declaration under Section 56 4(b)(1) of the Act, 21 U.S.C. section 360bbb-3(b)(1), unless the authorization is terminated or revoked sooner. Performed at [facility], [place] [address], [place], [place] [address]   SARS CORONAVIRUS 2 (TAT 6-24 HRS) Nasopharyngeal Nasopharyngeal Swab     Status: None   Collection Time: 03/04/19  3:52 AM   Specimen: Nasopharyngeal Swab  Result Value Ref Range Status   SARS Coronavirus 2 NEGATIVE NEGATIVE Final    Comment: (NOTE) SARS-CoV-2 target nucleic acids are NOT DETECTED. The  SARS-CoV-2 RNA is generally detectable in upper and lower respiratory specimens during the acute phase of infection. Negative results do not preclude SARS-CoV-2 infection, do not rule out co-infections with other pathogens, and should not be used as the sole basis for treatment or other patient management decisions. Negative results must be combined with clinical observations, patient history, and epidemiological information. The expected result is Negative. Fact Sheet for Patients: SugarRoll.be Fact Sheet for Healthcare Providers: https://www.woods-mathews.com/ This test is not yet approved or cleared by the [place] FDA and  has been authorized for detection and/or diagnosis of SARS-CoV-2 by FDA under an Emergency Use Authorization (EUA). This EUA will remain  in effect (meaning this test can be used) for the duration of the COVID-19 declaration under Section 56 4(b)(1) of the Act, 21 U.S.C. section 360bbb-3(b)(1), unless the authorization is terminated or revoked sooner. Performed at [facility], [place] [address], [place], [place] [address]   Culture, body fluid-bottle     Status: None (Preliminary result)   Collection Time: 03/04/19  2:15 PM   Specimen: Pleura  Result Value Ref Range Status   Specimen Description PLEURAL  Final   Special Requests BOTTLES DRAWN AEROBIC AND ANAEROBIC 10 CC EACH  Final   Culture   Final    NO GROWTH 3 DAYS Performed at [facility], [address], [place], [place] [address]    Report Status PENDING  Incomplete  Gram stain     Status: None   Collection Time: 03/04/19  2:16 PM   Specimen: Pleura; Body Fluid  Result Value Ref Range Status   Specimen Description PLEURAL  Final   Special Requests NONE  Final   Gram Stain   Final    NO ORGANISMS SEEN WBC PRESENT, PREDOMINANTLY MONONUCLEAR CYTOSPIN SMEAR Performed at [facility], [address], [place], [place] [address]    Report Status  03/04/2019 FINAL  Final  Today   Subjective    [name] today has no new concerns  Ambulated in hallways with staff w/o hypoxia, dyspnea on exertion or dizziness          Patient has been seen and examined prior to discharge   Objective   Blood pressure 124/79, pulse (!) 102, temperature 97.7 F (36.5 C), temperature source Oral, resp. rate 18, height 5\' 6"  (1.676 m), weight 50 kg, SpO2 95 %.   Intake/Output Summary (Last 24 hours) at 03/07/2019 1436 Last data filed at 03/07/2019 1300 Gross per 24 hour  Intake 480 ml  Output 200 ml  Net 280 ml   Exam Gen:- Awake Alert, no acute distress  HEENT:- [place].AT, No sclera icterus Neck-Supple Neck,No JVD,.  Lungs-improving air movement, no wheezing or rhonchi  CV- S1, S2 normal, irregular Abd-  +ve B.Sounds, Abd Soft, No tenderness,    Extremity/Skin:- No  edema,   good pulses Psych-affect is appropriate, oriented x3 Neuro-no new focal deficits, no tremors    Data Review   CBC w Diff:  Lab Results  Component Value Date   WBC 5.1 03/07/2019   HGB 9.4 (L) 03/07/2019   HCT 31.0 (L) 03/07/2019   PLT 281 03/07/2019   LYMPHOPCT 18 03/04/2019   MONOPCT 9 03/04/2019   EOSPCT 2 03/04/2019   BASOPCT 0 03/04/2019    CMP:  Lab Results  Component Value Date   NA 142 03/07/2019   K 4.1 03/07/2019   CL 106 03/07/2019   CO2 25 03/07/2019   BUN 17 03/07/2019   CREATININE 1.40 (H) 03/07/2019   PROT 7.0 03/02/2019   ALBUMIN 3.4 (L) 03/02/2019   BILITOT 0.2 (L) 03/02/2019   ALKPHOS 70 03/02/2019   AST 11 (L) 03/02/2019   ALT 9 03/02/2019  .   Total Discharge time is about 33 minutes  Roxan Hockey M.D on 03/07/2019 at 2:36 PM  Go to [email or web address] -  for contact info  [facility] - Office  [phone]

## 2019-03-07 NOTE — Progress Notes (Signed)
SATURATION QUALIFICATIONS: (This note is used to comply with regulatory documentation for home oxygen)  Patient Saturations on Room Air at Rest = 98%  Patient Saturations on Room Air while Ambulating = 94%  Patient Saturations on 0 Liters of oxygen while Ambulating = NA%  Please briefly explain why patient needs home oxygen:

## 2019-03-07 NOTE — Discharge Instructions (Signed)
1)Very low-salt diet advised 2)Weigh yourself daily, call if you gain more than 3 pounds in 1 day or more than 5 pounds in 1 week as your diuretic medications may need to be adjusted 3)Limit your Fluid  intake to no more than 60 ounces (1.8 Liters) per day 4) Toprol-XL/metoprolol has been changed to 50 mg once daily 5) please take Cardizem CD 180 mg daily to keep your heart from going too fast 6) increase Lasix/ furosemide to 40 mg daily for fluid 7) follow-up with your cardiologist Dr. Bronson Ing and with your primary care physician Dr. Nevada Crane within a week for recheck and reevaluation you will need a repeat CBC and BMP blood test at that visit

## 2019-03-07 NOTE — Plan of Care (Signed)
  Problem: Education: Goal: Knowledge of General Education information will improve Description: Including pain rating scale, medication(s)/side effects and non-pharmacologic comfort measures 03/07/2019 1428 by Santa Lighter, RN Outcome: Adequate for Discharge 03/07/2019 0921 by Santa Lighter, RN Outcome: Progressing   Problem: Health Behavior/Discharge Planning: Goal: Ability to manage health-related needs will improve 03/07/2019 1428 by Santa Lighter, RN Outcome: Adequate for Discharge 03/07/2019 0921 by Santa Lighter, RN Outcome: Progressing   Problem: Clinical Measurements: Goal: Ability to maintain clinical measurements within normal limits will improve Outcome: Adequate for Discharge Goal: Will remain free from infection Outcome: Adequate for Discharge Goal: Diagnostic test results will improve Outcome: Adequate for Discharge Goal: Respiratory complications will improve Outcome: Adequate for Discharge Goal: Cardiovascular complication will be avoided Outcome: Adequate for Discharge   Problem: Activity: Goal: Risk for activity intolerance will decrease Outcome: Adequate for Discharge   Problem: Nutrition: Goal: Adequate nutrition will be maintained Outcome: Adequate for Discharge   Problem: Coping: Goal: Level of anxiety will decrease Outcome: Adequate for Discharge   Problem: Elimination: Goal: Will not experience complications related to bowel motility Outcome: Adequate for Discharge Goal: Will not experience complications related to urinary retention Outcome: Adequate for Discharge   Problem: Pain Managment: Goal: General experience of comfort will improve Outcome: Adequate for Discharge   Problem: Safety: Goal: Ability to remain free from injury will improve Outcome: Adequate for Discharge   Problem: Skin Integrity: Goal: Risk for impaired skin integrity will decrease Outcome: Adequate for Discharge

## 2019-03-07 NOTE — Progress Notes (Signed)
Nsg Discharge Note  Admit Date:  03/04/2019 Discharge date: 03/07/2019   [name] to be D/C'd Home per MD order.  AVS completed.  Copy for chart, and copy for patient signed, and dated. Removed IV-clean, dry, intact. Reviewed d/c paperwork with patient and daughter. Answered all questions. Wheeled stable patient and belongings to main entrance where she was picked up by her daughter to d/c to home. Patient/caregiver able to verbalize understanding.  Discharge Medication: Allergies as of 03/07/2019      Reactions   Clonazepam Other (See Comments)   Pruritis   Codeine Nausea And Vomiting, Other (See Comments)   Reaction unknown   Sulfa Antibiotics Other (See Comments)   Reactions unknown      Medication List    TAKE these medications   acetaminophen 325 MG tablet Commonly known as: TYLENOL Take 2 tablets (650 mg total) by mouth every 6 (six) hours as needed for mild pain, fever or headache (or Fever >/= 101).   ALPRAZolam 0.25 MG tablet Commonly known as: XANAX Take 1 tablet (0.25 mg total) by mouth 2 (two) times daily as needed. for anxiety   amiodarone 200 MG tablet Commonly known as: PACERONE Take 1 tablet (200 mg total) by mouth daily.   dicyclomine 10 MG capsule Commonly known as: BENTYL Take 10 mg by mouth every 4 (four) hours as needed for spasms.   diltiazem 180 MG 24 hr capsule Commonly known as: CARDIZEM CD Take 1 capsule (180 mg total) by mouth daily.   furosemide 40 MG tablet Commonly known as: LASIX Take 1 tablet (40 mg total) by mouth daily. May take additional dose if shortness of breath or fluid buildup What changed:   medication strength  how much to take  additional instructions   levocetirizine 5 MG tablet Commonly known as: XYZAL Take 5 mg by mouth every evening.   meclizine 25 MG tablet Commonly known as: ANTIVERT Take 25 mg by mouth daily as needed for dizziness.   metoprolol succinate 25 MG 24 hr tablet Commonly known as:  TOPROL-XL Take 2 tablets (50 mg total) by mouth daily. What changed: how much to take   pantoprazole 40 MG tablet Commonly known as: PROTONIX Take 1 tablet (40 mg total) by mouth daily.   Zofran 4 MG tablet Generic drug: ondansetron Take 4 mg by mouth every 8 (eight) hours as needed for nausea or vomiting.       Discharge Assessment: Vitals:   03/07/19 0502 03/07/19 0521  BP: 124/79   Pulse: (!) 146 (!) 102  Resp: 18   Temp: 97.7 F (36.5 C)   SpO2: 95%    Skin clean, dry and intact without evidence of skin break down, no evidence of skin tears noted. IV catheter discontinued intact. Site without signs and symptoms of complications - no redness or edema noted at insertion site, patient denies c/o pain - only slight tenderness at site.  Dressing with slight pressure applied.  D/c Instructions-Education: Discharge instructions given to patient/family with verbalized understanding. D/c education completed with patient/family including follow up instructions, medication list, d/c activities limitations if indicated, with other d/c instructions as indicated by MD - patient able to verbalize understanding, all questions fully answered. Patient instructed to return to ED, call 911, or call MD for any changes in condition.  Patient escorted via [place], and D/C home via private auto.  Santa Lighter, RN 03/07/2019 2:43 PM

## 2019-03-07 NOTE — Plan of Care (Signed)
  Problem: Education: Goal: Knowledge of General Education information will improve Description Including pain rating scale, medication(s)/side effects and non-pharmacologic comfort measures Outcome: Progressing   Problem: Health Behavior/Discharge Planning: Goal: Ability to manage health-related needs will improve Outcome: Progressing   

## 2019-03-08 DIAGNOSIS — N200 Calculus of kidney: Secondary | ICD-10-CM | POA: Diagnosis not present

## 2019-03-08 DIAGNOSIS — I5033 Acute on chronic diastolic (congestive) heart failure: Secondary | ICD-10-CM | POA: Diagnosis not present

## 2019-03-08 DIAGNOSIS — I110 Hypertensive heart disease with heart failure: Secondary | ICD-10-CM | POA: Diagnosis not present

## 2019-03-08 DIAGNOSIS — I4891 Unspecified atrial fibrillation: Secondary | ICD-10-CM | POA: Diagnosis not present

## 2019-03-08 DIAGNOSIS — J9601 Acute respiratory failure with hypoxia: Secondary | ICD-10-CM | POA: Diagnosis not present

## 2019-03-08 DIAGNOSIS — R531 Weakness: Secondary | ICD-10-CM | POA: Diagnosis not present

## 2019-03-08 DIAGNOSIS — D509 Iron deficiency anemia, unspecified: Secondary | ICD-10-CM | POA: Diagnosis not present

## 2019-03-08 DIAGNOSIS — K315 Obstruction of duodenum: Secondary | ICD-10-CM | POA: Diagnosis not present

## 2019-03-08 DIAGNOSIS — N179 Acute kidney failure, unspecified: Secondary | ICD-10-CM | POA: Diagnosis not present

## 2019-03-08 DIAGNOSIS — K222 Esophageal obstruction: Secondary | ICD-10-CM | POA: Diagnosis not present

## 2019-03-08 DIAGNOSIS — R197 Diarrhea, unspecified: Secondary | ICD-10-CM | POA: Diagnosis not present

## 2019-03-08 DIAGNOSIS — K8689 Other specified diseases of pancreas: Secondary | ICD-10-CM | POA: Diagnosis not present

## 2019-03-08 DIAGNOSIS — E785 Hyperlipidemia, unspecified: Secondary | ICD-10-CM | POA: Diagnosis not present

## 2019-03-08 NOTE — Care Management Important Message (Signed)
Important Message  Patient Details  Name: Renee Blake MRN: [id number] Date of Birth: 04-20-1928   Medicare Important Message Given:  Yes     [name] 03/08/2019, 10:56 AM

## 2019-03-09 DIAGNOSIS — K8689 Other specified diseases of pancreas: Secondary | ICD-10-CM | POA: Diagnosis not present

## 2019-03-09 DIAGNOSIS — I110 Hypertensive heart disease with heart failure: Secondary | ICD-10-CM | POA: Diagnosis not present

## 2019-03-09 DIAGNOSIS — I4891 Unspecified atrial fibrillation: Secondary | ICD-10-CM | POA: Diagnosis not present

## 2019-03-09 DIAGNOSIS — K222 Esophageal obstruction: Secondary | ICD-10-CM | POA: Diagnosis not present

## 2019-03-09 DIAGNOSIS — I5033 Acute on chronic diastolic (congestive) heart failure: Secondary | ICD-10-CM | POA: Diagnosis not present

## 2019-03-09 DIAGNOSIS — E785 Hyperlipidemia, unspecified: Secondary | ICD-10-CM | POA: Diagnosis not present

## 2019-03-09 DIAGNOSIS — R531 Weakness: Secondary | ICD-10-CM | POA: Diagnosis not present

## 2019-03-09 DIAGNOSIS — J9601 Acute respiratory failure with hypoxia: Secondary | ICD-10-CM | POA: Diagnosis not present

## 2019-03-09 DIAGNOSIS — K315 Obstruction of duodenum: Secondary | ICD-10-CM | POA: Diagnosis not present

## 2019-03-09 DIAGNOSIS — N200 Calculus of kidney: Secondary | ICD-10-CM | POA: Diagnosis not present

## 2019-03-09 DIAGNOSIS — N179 Acute kidney failure, unspecified: Secondary | ICD-10-CM | POA: Diagnosis not present

## 2019-03-09 DIAGNOSIS — R197 Diarrhea, unspecified: Secondary | ICD-10-CM | POA: Diagnosis not present

## 2019-03-09 DIAGNOSIS — D509 Iron deficiency anemia, unspecified: Secondary | ICD-10-CM | POA: Diagnosis not present

## 2019-03-09 LAB — CULTURE, BODY FLUID W GRAM STAIN -BOTTLE: Culture: NO GROWTH

## 2019-03-13 ENCOUNTER — Encounter: Payer: Self-pay | Admitting: Cardiology

## 2019-03-13 ENCOUNTER — Other Ambulatory Visit: Payer: Self-pay

## 2019-03-13 ENCOUNTER — Emergency Department (HOSPITAL_COMMUNITY): Payer: Medicare Other

## 2019-03-13 ENCOUNTER — Encounter (HOSPITAL_COMMUNITY): Payer: Self-pay | Admitting: Emergency Medicine

## 2019-03-13 ENCOUNTER — Inpatient Hospital Stay (HOSPITAL_COMMUNITY)
Admission: EM | Admit: 2019-03-13 | Discharge: 2019-03-19 | DRG: 291 | Disposition: A | Discharge status: Skilled Nursing Facility | Payer: Medicare Other | Attending: Internal Medicine | Admitting: Internal Medicine

## 2019-03-13 DIAGNOSIS — D509 Iron deficiency anemia, unspecified: Secondary | ICD-10-CM | POA: Diagnosis not present

## 2019-03-13 DIAGNOSIS — Z20828 Contact with and (suspected) exposure to other viral communicable diseases: Secondary | ICD-10-CM | POA: Diagnosis present

## 2019-03-13 DIAGNOSIS — K222 Esophageal obstruction: Secondary | ICD-10-CM | POA: Diagnosis not present

## 2019-03-13 DIAGNOSIS — N179 Acute kidney failure, unspecified: Secondary | ICD-10-CM | POA: Diagnosis not present

## 2019-03-13 DIAGNOSIS — Z885 Allergy status to narcotic agent status: Secondary | ICD-10-CM

## 2019-03-13 DIAGNOSIS — N951 Menopausal and female climacteric states: Secondary | ICD-10-CM | POA: Diagnosis present

## 2019-03-13 DIAGNOSIS — E785 Hyperlipidemia, unspecified: Secondary | ICD-10-CM | POA: Diagnosis not present

## 2019-03-13 DIAGNOSIS — I10 Essential (primary) hypertension: Secondary | ICD-10-CM | POA: Diagnosis present

## 2019-03-13 DIAGNOSIS — Z66 Do not resuscitate: Secondary | ICD-10-CM | POA: Diagnosis present

## 2019-03-13 DIAGNOSIS — R531 Weakness: Secondary | ICD-10-CM | POA: Diagnosis not present

## 2019-03-13 DIAGNOSIS — N1832 Chronic kidney disease, stage 3b: Secondary | ICD-10-CM | POA: Diagnosis present

## 2019-03-13 DIAGNOSIS — I5033 Acute on chronic diastolic (congestive) heart failure: Secondary | ICD-10-CM | POA: Diagnosis present

## 2019-03-13 DIAGNOSIS — I130 Hypertensive heart and chronic kidney disease with heart failure and stage 1 through stage 4 chronic kidney disease, or unspecified chronic kidney disease: Principal | ICD-10-CM | POA: Diagnosis present

## 2019-03-13 DIAGNOSIS — R5381 Other malaise: Secondary | ICD-10-CM | POA: Diagnosis present

## 2019-03-13 DIAGNOSIS — I4821 Permanent atrial fibrillation: Secondary | ICD-10-CM | POA: Diagnosis present

## 2019-03-13 DIAGNOSIS — R64 Cachexia: Secondary | ICD-10-CM | POA: Diagnosis present

## 2019-03-13 DIAGNOSIS — R627 Adult failure to thrive: Secondary | ICD-10-CM | POA: Diagnosis present

## 2019-03-13 DIAGNOSIS — Z681 Body mass index (BMI) 19 or less, adult: Secondary | ICD-10-CM

## 2019-03-13 DIAGNOSIS — Z79899 Other long term (current) drug therapy: Secondary | ICD-10-CM

## 2019-03-13 DIAGNOSIS — Z923 Personal history of irradiation: Secondary | ICD-10-CM | POA: Diagnosis not present

## 2019-03-13 DIAGNOSIS — J9601 Acute respiratory failure with hypoxia: Secondary | ICD-10-CM | POA: Diagnosis not present

## 2019-03-13 DIAGNOSIS — Z882 Allergy status to sulfonamides status: Secondary | ICD-10-CM

## 2019-03-13 DIAGNOSIS — R197 Diarrhea, unspecified: Secondary | ICD-10-CM | POA: Diagnosis not present

## 2019-03-13 DIAGNOSIS — Z85828 Personal history of other malignant neoplasm of skin: Secondary | ICD-10-CM | POA: Diagnosis not present

## 2019-03-13 DIAGNOSIS — R0989 Other specified symptoms and signs involving the circulatory and respiratory systems: Secondary | ICD-10-CM | POA: Diagnosis not present

## 2019-03-13 DIAGNOSIS — N200 Calculus of kidney: Secondary | ICD-10-CM | POA: Diagnosis present

## 2019-03-13 DIAGNOSIS — R630 Anorexia: Secondary | ICD-10-CM | POA: Diagnosis not present

## 2019-03-13 DIAGNOSIS — I110 Hypertensive heart disease with heart failure: Secondary | ICD-10-CM | POA: Diagnosis not present

## 2019-03-13 DIAGNOSIS — J810 Acute pulmonary edema: Secondary | ICD-10-CM | POA: Diagnosis not present

## 2019-03-13 DIAGNOSIS — N281 Cyst of kidney, acquired: Secondary | ICD-10-CM | POA: Diagnosis present

## 2019-03-13 DIAGNOSIS — I482 Chronic atrial fibrillation, unspecified: Secondary | ICD-10-CM | POA: Diagnosis not present

## 2019-03-13 DIAGNOSIS — Z87442 Personal history of urinary calculi: Secondary | ICD-10-CM

## 2019-03-13 DIAGNOSIS — K8689 Other specified diseases of pancreas: Secondary | ICD-10-CM | POA: Diagnosis not present

## 2019-03-13 DIAGNOSIS — E875 Hyperkalemia: Secondary | ICD-10-CM | POA: Diagnosis not present

## 2019-03-13 DIAGNOSIS — I509 Heart failure, unspecified: Secondary | ICD-10-CM

## 2019-03-13 DIAGNOSIS — R0902 Hypoxemia: Secondary | ICD-10-CM

## 2019-03-13 DIAGNOSIS — Z8249 Family history of ischemic heart disease and other diseases of the circulatory system: Secondary | ICD-10-CM

## 2019-03-13 DIAGNOSIS — I5032 Chronic diastolic (congestive) heart failure: Secondary | ICD-10-CM

## 2019-03-13 DIAGNOSIS — R0602 Shortness of breath: Secondary | ICD-10-CM | POA: Diagnosis not present

## 2019-03-13 DIAGNOSIS — Z888 Allergy status to other drugs, medicaments and biological substances status: Secondary | ICD-10-CM

## 2019-03-13 DIAGNOSIS — Z8542 Personal history of malignant neoplasm of other parts of uterus: Secondary | ICD-10-CM

## 2019-03-13 DIAGNOSIS — Z743 Need for continuous supervision: Secondary | ICD-10-CM | POA: Diagnosis not present

## 2019-03-13 DIAGNOSIS — I088 Other rheumatic multiple valve diseases: Secondary | ICD-10-CM | POA: Diagnosis present

## 2019-03-13 DIAGNOSIS — I4891 Unspecified atrial fibrillation: Secondary | ICD-10-CM | POA: Diagnosis not present

## 2019-03-13 DIAGNOSIS — I4819 Other persistent atrial fibrillation: Secondary | ICD-10-CM | POA: Diagnosis not present

## 2019-03-13 DIAGNOSIS — Z85048 Personal history of other malignant neoplasm of rectum, rectosigmoid junction, and anus: Secondary | ICD-10-CM

## 2019-03-13 DIAGNOSIS — D631 Anemia in chronic kidney disease: Secondary | ICD-10-CM | POA: Diagnosis present

## 2019-03-13 DIAGNOSIS — K315 Obstruction of duodenum: Secondary | ICD-10-CM | POA: Diagnosis not present

## 2019-03-13 DIAGNOSIS — K219 Gastro-esophageal reflux disease without esophagitis: Secondary | ICD-10-CM | POA: Diagnosis present

## 2019-03-13 DIAGNOSIS — D649 Anemia, unspecified: Secondary | ICD-10-CM | POA: Diagnosis not present

## 2019-03-13 LAB — CBC
HCT: 33.1 % — ABNORMAL LOW (ref 36.0–46.0)
Hemoglobin: 9.9 g/dL — ABNORMAL LOW (ref 12.0–15.0)
MCH: 28.4 pg (ref 26.0–34.0)
MCHC: 29.9 g/dL — ABNORMAL LOW (ref 30.0–36.0)
MCV: 94.8 fL (ref 80.0–100.0)
Platelets: 328 10*3/uL (ref 150–400)
RBC: 3.49 MIL/uL — ABNORMAL LOW (ref 3.87–5.11)
RDW: 14.7 % (ref 11.5–15.5)
WBC: 9.2 10*3/uL (ref 4.0–10.5)
nRBC: 0 % (ref 0.0–0.2)

## 2019-03-13 LAB — BASIC METABOLIC PANEL
Anion gap: 9 (ref 5–15)
BUN: 36 mg/dL — ABNORMAL HIGH (ref 8–23)
CO2: 26 mmol/L (ref 22–32)
Calcium: 9.1 mg/dL (ref 8.9–10.3)
Chloride: 105 mmol/L (ref 98–111)
Creatinine, Ser: 1.77 mg/dL — ABNORMAL HIGH (ref 0.44–1.00)
GFR calc Af Amer: 29 mL/min — ABNORMAL LOW (ref >60)
GFR calc non Af Amer: 25 mL/min — ABNORMAL LOW (ref >60)
Glucose, Bld: 118 mg/dL — ABNORMAL HIGH (ref 70–99)
Potassium: 5.6 mmol/L — ABNORMAL HIGH (ref 3.5–5.1)
Sodium: 140 mmol/L (ref 135–145)

## 2019-03-13 LAB — BRAIN NATRIURETIC PEPTIDE: B Natriuretic Peptide: 478 pg/mL — ABNORMAL HIGH (ref 0.0–100.0)

## 2019-03-13 LAB — TROPONIN I (HIGH SENSITIVITY): Troponin I (High Sensitivity): 5 ng/L (ref <18)

## 2019-03-13 IMAGING — DX DG CHEST 1V PORT
1 series · 1 of 1 positions shown · non-contrast
Comparison: [DATE]

CLINICAL DATA: Shortness of breath

EXAM:
PORTABLE CHEST 1 VIEW

[chest ap]
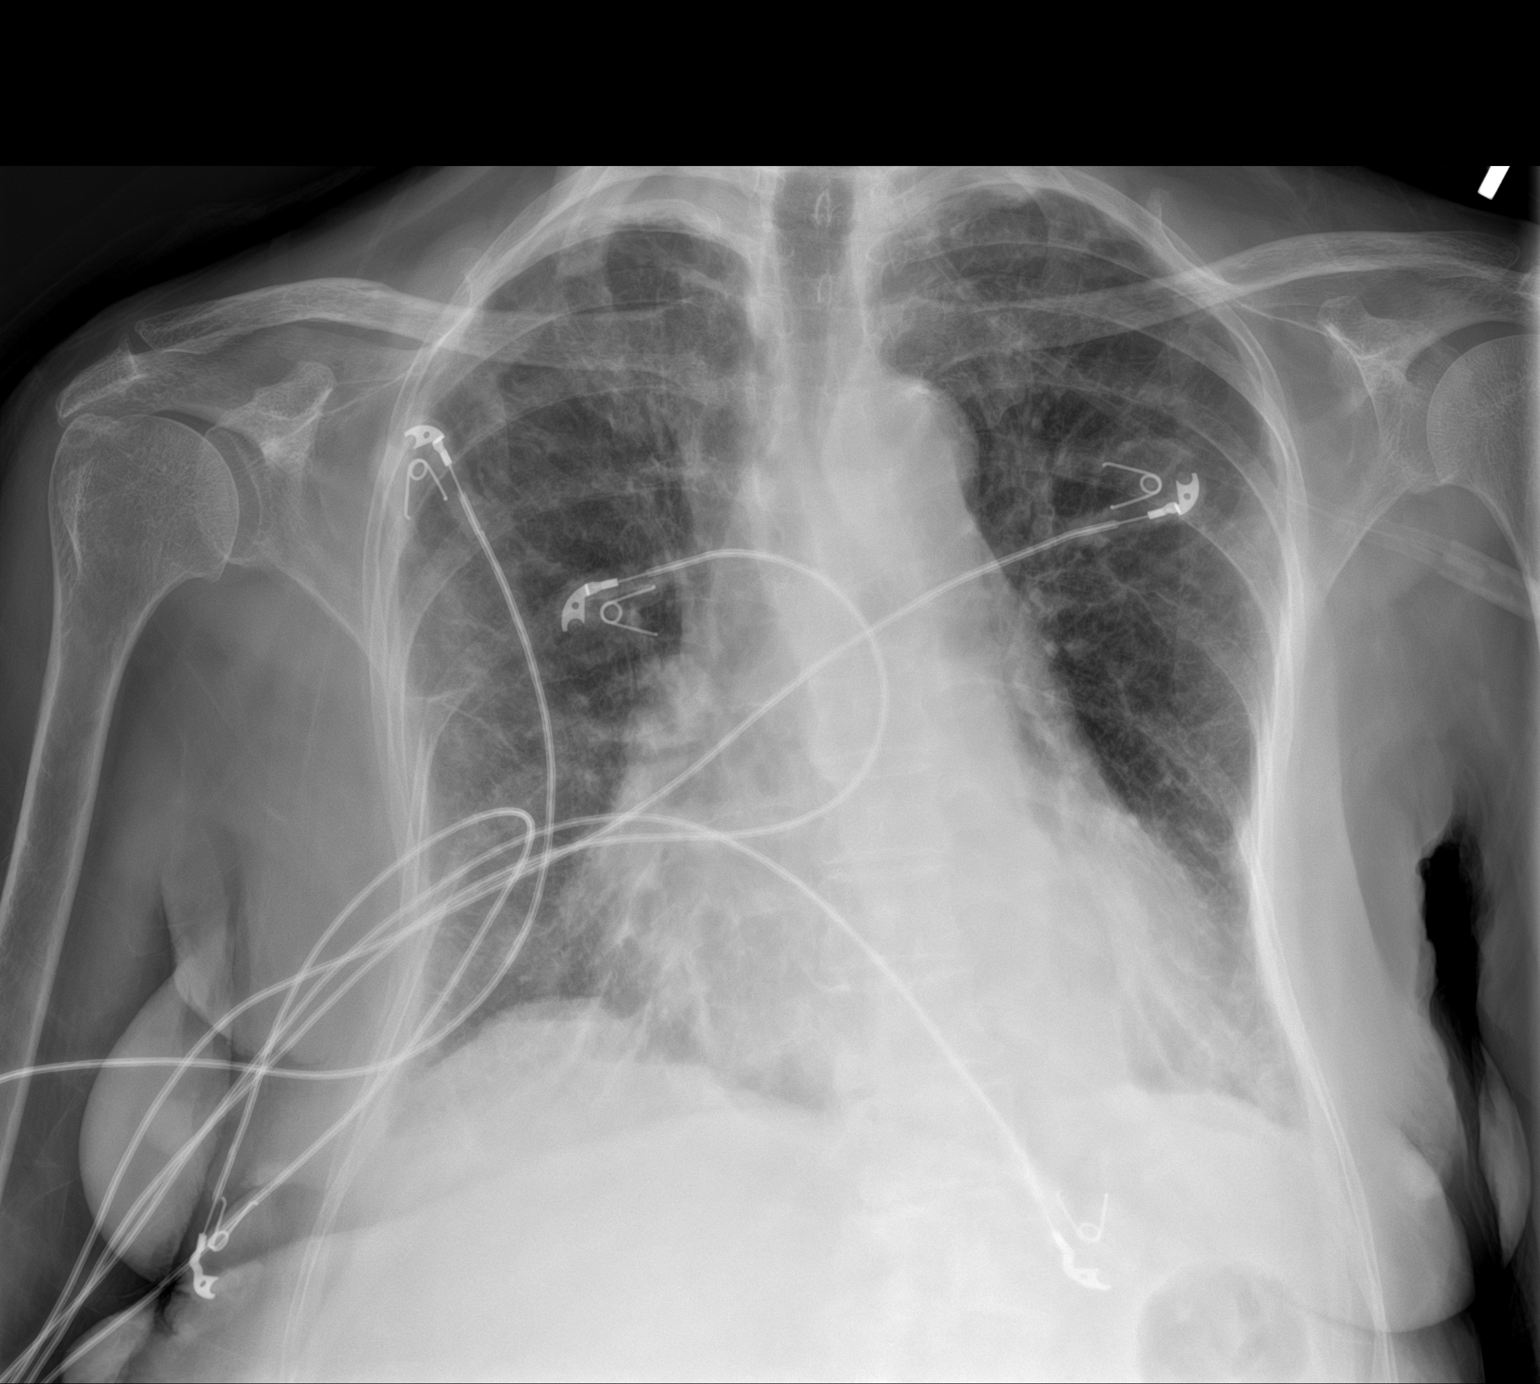

[1 of 1 positions shown; findings below may reference images not displayed]

FINDINGS: Stable cardiomegaly. Calcified aortic knob. Mildly prominent
interstitial markings are similar to prior. No new focal airspace
consolidation. No significant pleural fluid collection. No
pneumothorax.
IMPRESSION: Cardiomegaly with persistent increased interstitial markings, which
could reflect a component of edema. No new focal airspace
consolidation.

## 2019-03-13 MED ORDER — FUROSEMIDE 10 MG/ML IJ SOLN
40.0000 mg | Freq: Once | INTRAMUSCULAR | Status: AC
  Administered 2019-03-13: 40 mg via INTRAVENOUS
  Filled 2019-03-13: qty 4

## 2019-03-13 NOTE — ED Notes (Signed)
ED Provider at bedside. 

## 2019-03-13 NOTE — ED Provider Notes (Signed)
Emergency Department Provider Note   I have reviewed the triage vital signs and the nursing notes.   HISTORY  Chief Complaint Weakness (shortness of breath)   HPI Renee Blake is a 85 y.o. female with PMH of a-fib (not anticoagulated due to GI bleed), CHF, HTN, HLD, and recent admit for SOB presents emergency department for evaluation of episodic shortness of breath which became worse today.  Patient states that since her recent discharge she has had several episodes where she becomes very warm all over and will feel "sick on my stomach" without specific pain or vomiting.  No diarrhea.  Patient denies any chest pain or heart palpitations.  She states that today she felt very short of breath and EMS was called who transported her to the emergency department.  She was placed on 2L [place] by EMS with RA sats of 88% with them.  Patient denies any fevers or chills. Denies cough. Has been compliant with all home medications.    Past Medical History:  Diagnosis Date  . Acute blood loss anemia   . Atrial fibrillation ([place])    a. diagnosed in 12/2018. Rate-control pursued given not a candidate for anticoagulation  . Chronic diarrhea   . Dilation of biliary tract    biliary duct per 02/26/13 CT  . Duodenal stricture   . Esophageal stricture   . Gastric ulcer   . GI bleed   . Hyperlipemia   . Hypertension   . Microscopic colitis   . Nephrolithiasis   . Pancreatic duct dilated 02/26/13  . Schatzki's ring   . Skin cancer   . Sliding hiatal hernia   . Vertigo     Patient Active Problem List   Diagnosis Date Noted  . Bilateral pleural effusion 03/04/2019  . Chronic atrial fibrillation ([place]) 03/04/2019  . DNR (do not resuscitate) 03/04/2019  . Acute on chronic diastolic congestive heart failure ([place]) 03/02/2019  . Acute on chronic diastolic HF (heart failure) ([place]) 03/02/2019  . Chronic renal failure, stage 3b 03/02/2019  . Acute respiratory failure with hypoxia ([place]) 03/02/2019    . Acute exacerbation of CHF (congestive heart failure) ([place]) 01/27/2019  . Atrial fibrillation with RVR ([place]) 01/27/2019  . Iron deficiency anemia due to chronic blood loss---H/o Gi Bleed 01/27/2019  . Vaginal mass 07/29/2017  . Heme positive stool 07/25/2017  . Cellulitis 12/10/2016  . Facial cellulitis   . Erroneous encounter - disregard 03/17/2015  . AKI (acute kidney injury) ([place])   . Metabolic acidosis 0000000  . Acute renal failure syndrome ([place])   . Renal failure 08/22/2014  . Hyperkalemia 08/22/2014  . UTI (urinary tract infection) 08/22/2014  . Acute renal failure ([place]) 08/22/2014  . Dyspnea 03/15/2014  . Diabetes mellitus ([place]) 06/08/2013  . Acute diastolic congestive heart failure ([place]) 06/07/2013  . Syncope 03/26/2013  . Rectal bleeding 03/26/2013  . Dehydration 03/26/2013  . Generalized weakness 03/26/2013  . Malnutrition of moderate degree ([place]) 03/01/2013  . Diarrhea 02/28/2013  . Loss of weight 02/28/2013  . H/o Prior duodenal ulcer with hemorrhage 02/27/2013  . H/o Prior Lower GI bleed (anorectal Ca) 02/26/2013  . Acute blood loss anemia 02/26/2013  . UTI (lower urinary tract infection) 02/26/2013  . Hypertension 02/26/2013    Past Surgical History:  Procedure Laterality Date  . BIOPSY  07/28/2017   Procedure: BIOPSY;  Surgeon: Rogene Houston, MD;  Location[facility];  Service: Endoscopy;;  rectum  . COLONOSCOPY N/A 07/28/2017   Procedure: COLONOSCOPY;  Surgeon: Rogene Houston, MD;  Location: [facility];  Service: Endoscopy;  Laterality: N/A;  . ESOPHAGOGASTRODUODENOSCOPY N/A 02/27/2013   Procedure: ESOPHAGOGASTRODUODENOSCOPY (EGD);  Surgeon: Ladene Artist, MD;  Location: [facility] ENDOSCOPY;  Service: Endoscopy;  Laterality: N/A;  . ESOPHAGOGASTRODUODENOSCOPY N/A 03/28/2013   Procedure: ESOPHAGOGASTRODUODENOSCOPY (EGD);  Surgeon: Rogene Houston, MD;  Location: [facility];  Service: Endoscopy;  Laterality: N/A;  . HOT HEMOSTASIS N/A 02/27/2013    Procedure: HOT HEMOSTASIS (ARGON PLASMA COAGULATION/BICAP);  Surgeon: Ladene Artist, MD;  Location: [facility] ENDOSCOPY;  Service: Endoscopy;  Laterality: N/A;  . NOSE SURGERY     for skin cancer  . POLYPECTOMY  07/28/2017   Procedure: POLYPECTOMY;  Surgeon: Rogene Houston, MD;  Location: [facility];  Service: Endoscopy;;  colon     Allergies Clonazepam, Codeine, and Sulfa antibiotics  Family History  Problem Relation Age of Onset  . Heart attack Father     Social History Social History   Tobacco Use  . Smoking status: Never Smoker  . Smokeless tobacco: Never Used  Substance Use Topics  . Alcohol use: No    Alcohol/week: 0.0 standard drinks  . Drug use: No    Review of Systems  Constitutional: No fever/chills. Episodic flushed feeling.  Eyes: No visual changes. ENT: No sore throat. Cardiovascular: Denies chest pain. Respiratory: Episodic shortness of breath. Gastrointestinal: No abdominal pain. Positive nausea, no vomiting.  No diarrhea.  No constipation. Genitourinary: Negative for dysuria. Musculoskeletal: Negative for back pain. Skin: Negative for rash. Neurological: Negative for headaches, focal weakness or numbness.  10-point ROS otherwise negative.  ____________________________________________   PHYSICAL EXAM:  VITAL SIGNS: ED Triage Vitals  Enc Vitals Group     BP 03/13/19 1906 (!) 115/100     Pulse Rate 03/13/19 1905 (!) 107     Resp 03/13/19 1905 20     Temp 03/13/19 1905 97.8 F (36.6 C)     Temp Source 03/13/19 1905 Oral     SpO2 03/13/19 1903 100 %     Weight 03/13/19 1906 109 lb (49.4 kg)     Height 03/13/19 1906 5\' 4"  (1.626 m)   Constitutional: Alert and oriented. Well appearing and in no acute distress. Eyes: Conjunctivae are normal. Head: Atraumatic. Nose: No congestion/rhinnorhea. Mouth/Throat: Mucous membranes are moist.   Neck: No stridor.   Cardiovascular: A-fib. Good peripheral circulation. Grossly normal heart sounds.    Respiratory: Normal respiratory effort. No retractions. Lungs CTAB. Gastrointestinal: Soft and nontender. No distention.  Musculoskeletal: No lower extremity tenderness nor edema. No gross deformities of extremities. Neurologic:  Normal speech and language. No gross focal neurologic deficits are appreciated.  Skin:  Skin is warm, dry and intact. No rash noted.   ____________________________________________   LABS (all labs ordered are listed, but only abnormal results are displayed)  Labs Reviewed  CBC - Abnormal; Notable for the following components:      Result Value   RBC 3.49 (*)    Hemoglobin 9.9 (*)    HCT 33.1 (*)    MCHC 29.9 (*)    All other components within normal limits  BASIC METABOLIC PANEL - Abnormal; Notable for the following components:   Potassium 5.6 (*)    Glucose, Bld 118 (*)    BUN 36 (*)    Creatinine, Ser 1.77 (*)    GFR calc non Af Amer 25 (*)    GFR calc Af Amer 29 (*)    All other components within normal limits  BRAIN NATRIURETIC PEPTIDE - Abnormal; Notable for the following components:   B Natriuretic Peptide 478.0 (*)    All other components within normal limits  CBC - Abnormal; Notable for the following components:   RBC 3.43 (*)    Hemoglobin 9.8 (*)    HCT 32.1 (*)    All other components within normal limits  COMPREHENSIVE METABOLIC PANEL - Abnormal; Notable for the following components:   Glucose, Bld 100 (*)    BUN 30 (*)    Creatinine, Ser 1.48 (*)    AST 13 (*)    GFR calc non Af Amer 31 (*)    GFR calc Af Amer 36 (*)    All other components within normal limits  SARS CORONAVIRUS 2 (TAT 6-24 HRS)  TROPONIN I (HIGH SENSITIVITY)  TROPONIN I (HIGH SENSITIVITY)   ____________________________________________  EKG   EKG Interpretation  Date/Time:  Saturday March 13 2019 19:16:44 EST Ventricular Rate:  84 PR Interval:    QRS Duration: 120 QT Interval:  404 QTC Calculation: 477 R Axis:   -39 Text Interpretation: Atrial  fibrillation Left axis deviation Pulmonary disease pattern Right bundle branch block Abnormal ECG Similar to 12/3 tracing. No STEMI. Confirmed by Nanda Quinton [phone]) on 03/13/2019 7:53:46 PM       ____________________________________________  RADIOLOGY  DG Chest Portable 1 View  Result Date: 03/13/2019 CLINICAL DATA:  Shortness of breath EXAM: PORTABLE CHEST 1 VIEW COMPARISON:  03/07/2019 FINDINGS: Stable cardiomegaly. Calcified aortic knob. Mildly prominent interstitial markings are similar to prior. No new focal airspace consolidation. No significant pleural fluid collection. No pneumothorax. IMPRESSION: Cardiomegaly with persistent increased interstitial markings, which could reflect a component of edema. No new focal airspace consolidation. Electronically Signed   By: Davina Poke M.D.   On: 03/13/2019 22:18    ____________________________________________   PROCEDURES  Procedure(s) performed:   Procedures  CRITICAL CARE Performed by: Margette Fast Total critical care time: 35 minutes Critical care time was exclusive of separately billable procedures and treating other patients. Critical care was necessary to treat or prevent imminent or life-threatening deterioration. Critical care was time spent personally by me on the following activities: development of treatment plan with patient and/or surrogate as well as nursing, discussions with consultants, evaluation of patient's response to treatment, examination of patient, obtaining history from patient or surrogate, ordering and performing treatments and interventions, ordering and review of laboratory studies, ordering and review of radiographic studies, pulse oximetry and re-evaluation of patient's condition.  Nanda Quinton, MD Emergency Medicine  ____________________________________________   INITIAL IMPRESSION / ASSESSMENT AND PLAN / ED COURSE  Pertinent labs & imaging results that were available during my care of the  patient were reviewed by me and considered in my medical decision making (see chart for details).   Patient arrives to the emergency department with episodic shortness of breath.  EKG is similar to prior with atrial fibrillation w/o RVR.  She is not having chest pain.  My suspicion for ACS is exceedingly low.  She is feeling much better on my evaluation once placed in an acute care room.  Initially turned her oxygen off and will assess her room air sats since arriving in the emergency department.  Will obtain chest x-ray as last time her hypoxemia improved with diuresis and thoracentesis.   Labs similar to prior. Patient unable to be weaned from O2 although SOB is improved. Will given lasix but question component of pulmonary HTN and may need home O2. Will admit for  obs.   Discussed patient's case with [facility], Dr. Darrick Meigs to request admission. Patient and family (if present) updated with plan. Care transferred to [facility] service.  I reviewed all nursing notes, vitals, pertinent old records, EKGs, labs, imaging (as available).  ____________________________________________  FINAL CLINICAL IMPRESSION(S) / ED DIAGNOSES  Final diagnoses:  Hypoxemia  Acute pulmonary edema (HCC)  SOB (shortness of breath)     MEDICATIONS GIVEN DURING THIS VISIT:  Medications  acetaminophen (TYLENOL) tablet 650 mg (650 mg Oral Given 03/14/19 0629)  amiodarone (PACERONE) tablet 200 mg (has no administration in time range)  diltiazem (CARDIZEM CD) 24 hr capsule 180 mg (has no administration in time range)  ALPRAZolam (XANAX) tablet 0.25 mg (has no administration in time range)  dicyclomine (BENTYL) capsule 10 mg (has no administration in time range)  meclizine (ANTIVERT) tablet 25 mg (has no administration in time range)  pantoprazole (PROTONIX) EC tablet 40 mg (has no administration in time range)  metoprolol succinate (TOPROL-XL) 24 hr tablet 50 mg (has no administration in time range)  enoxaparin (LOVENOX) injection  30 mg (has no administration in time range)  sodium chloride flush (NS) 0.9 % injection 3 mL (has no administration in time range)  sodium chloride flush (NS) 0.9 % injection 3 mL (has no administration in time range)  0.9 %  sodium chloride infusion (has no administration in time range)  ondansetron (ZOFRAN) tablet 4 mg (has no administration in time range)    Or  ondansetron (ZOFRAN) injection 4 mg (has no administration in time range)  sodium polystyrene (KAYEXALATE) 15 GM/60ML suspension 15 g (15 g Oral Not Given 03/14/19 0407)  loratadine (CLARITIN) tablet 10 mg (has no administration in time range)  furosemide (LASIX) injection 40 mg (40 mg Intravenous Given 03/13/19 2251)  furosemide (LASIX) injection 40 mg (40 mg Intravenous Given 03/14/19 0620)     Note:  This document was prepared using Dragon voice recognition software and may include unintentional dictation errors.  Nanda Quinton, MD, [facility] Emergency Medicine    Deatra Mcmahen, [place], MD 03/14/19 [phone]

## 2019-03-13 NOTE — ED Notes (Signed)
EMS reports patient's room air sats at 88 percent. Ems placed patient on 3 liters of oxygen by nasal cannula.

## 2019-03-13 NOTE — ED Triage Notes (Signed)
Patient came in by EMS. Patient complaining of shortness of breath and generalized weakness. Ems reports that patient was a-fib on the monitor. Patient states she was fine this am but later this evening she became short of breath and started having weakness.

## 2019-03-13 NOTE — ED Notes (Signed)
Purewick in place due to IV lasix given.

## 2019-03-13 NOTE — ED Notes (Signed)
Spoke with patients daughter, explained to her that the pt had been seen by the triage RN and is being checked on. Daughter not happy about not being able to be with pt at this time and requesting to have her mother transferred to [facility], explained to her that this would have to be discussed with a MD. Daughter continues to be upset, AC called to come and speak with daughter again.

## 2019-03-14 ENCOUNTER — Encounter (HOSPITAL_COMMUNITY): Payer: Self-pay | Admitting: Family Medicine

## 2019-03-14 DIAGNOSIS — I509 Heart failure, unspecified: Secondary | ICD-10-CM

## 2019-03-14 DIAGNOSIS — Z8542 Personal history of malignant neoplasm of other parts of uterus: Secondary | ICD-10-CM | POA: Diagnosis not present

## 2019-03-14 DIAGNOSIS — R64 Cachexia: Secondary | ICD-10-CM | POA: Diagnosis present

## 2019-03-14 DIAGNOSIS — I5033 Acute on chronic diastolic (congestive) heart failure: Secondary | ICD-10-CM | POA: Diagnosis not present

## 2019-03-14 DIAGNOSIS — Z923 Personal history of irradiation: Secondary | ICD-10-CM | POA: Diagnosis not present

## 2019-03-14 DIAGNOSIS — E875 Hyperkalemia: Secondary | ICD-10-CM | POA: Diagnosis present

## 2019-03-14 DIAGNOSIS — Z888 Allergy status to other drugs, medicaments and biological substances status: Secondary | ICD-10-CM | POA: Diagnosis not present

## 2019-03-14 DIAGNOSIS — I5032 Chronic diastolic (congestive) heart failure: Secondary | ICD-10-CM | POA: Diagnosis not present

## 2019-03-14 DIAGNOSIS — Z681 Body mass index (BMI) 19 or less, adult: Secondary | ICD-10-CM | POA: Diagnosis not present

## 2019-03-14 DIAGNOSIS — Z66 Do not resuscitate: Secondary | ICD-10-CM

## 2019-03-14 DIAGNOSIS — Z85828 Personal history of other malignant neoplasm of skin: Secondary | ICD-10-CM | POA: Diagnosis not present

## 2019-03-14 DIAGNOSIS — N1832 Chronic kidney disease, stage 3b: Secondary | ICD-10-CM | POA: Diagnosis not present

## 2019-03-14 DIAGNOSIS — Z882 Allergy status to sulfonamides status: Secondary | ICD-10-CM | POA: Diagnosis not present

## 2019-03-14 DIAGNOSIS — I10 Essential (primary) hypertension: Secondary | ICD-10-CM | POA: Diagnosis not present

## 2019-03-14 DIAGNOSIS — Z885 Allergy status to narcotic agent status: Secondary | ICD-10-CM | POA: Diagnosis not present

## 2019-03-14 DIAGNOSIS — J9601 Acute respiratory failure with hypoxia: Secondary | ICD-10-CM | POA: Diagnosis not present

## 2019-03-14 DIAGNOSIS — R627 Adult failure to thrive: Secondary | ICD-10-CM | POA: Diagnosis not present

## 2019-03-14 DIAGNOSIS — R0602 Shortness of breath: Secondary | ICD-10-CM | POA: Diagnosis not present

## 2019-03-14 DIAGNOSIS — D649 Anemia, unspecified: Secondary | ICD-10-CM | POA: Diagnosis not present

## 2019-03-14 DIAGNOSIS — I482 Chronic atrial fibrillation, unspecified: Secondary | ICD-10-CM | POA: Diagnosis not present

## 2019-03-14 DIAGNOSIS — R2689 Other abnormalities of gait and mobility: Secondary | ICD-10-CM | POA: Diagnosis not present

## 2019-03-14 DIAGNOSIS — K219 Gastro-esophageal reflux disease without esophagitis: Secondary | ICD-10-CM | POA: Diagnosis not present

## 2019-03-14 DIAGNOSIS — N200 Calculus of kidney: Secondary | ICD-10-CM | POA: Diagnosis not present

## 2019-03-14 DIAGNOSIS — R630 Anorexia: Secondary | ICD-10-CM | POA: Diagnosis not present

## 2019-03-14 DIAGNOSIS — I130 Hypertensive heart and chronic kidney disease with heart failure and stage 1 through stage 4 chronic kidney disease, or unspecified chronic kidney disease: Secondary | ICD-10-CM | POA: Diagnosis present

## 2019-03-14 DIAGNOSIS — E785 Hyperlipidemia, unspecified: Secondary | ICD-10-CM | POA: Diagnosis present

## 2019-03-14 DIAGNOSIS — M6281 Muscle weakness (generalized): Secondary | ICD-10-CM | POA: Diagnosis not present

## 2019-03-14 DIAGNOSIS — N951 Menopausal and female climacteric states: Secondary | ICD-10-CM | POA: Diagnosis present

## 2019-03-14 DIAGNOSIS — Z20828 Contact with and (suspected) exposure to other viral communicable diseases: Secondary | ICD-10-CM | POA: Diagnosis present

## 2019-03-14 DIAGNOSIS — R0902 Hypoxemia: Secondary | ICD-10-CM | POA: Diagnosis not present

## 2019-03-14 DIAGNOSIS — I4819 Other persistent atrial fibrillation: Secondary | ICD-10-CM | POA: Diagnosis not present

## 2019-03-14 DIAGNOSIS — Z8249 Family history of ischemic heart disease and other diseases of the circulatory system: Secondary | ICD-10-CM | POA: Diagnosis not present

## 2019-03-14 DIAGNOSIS — Z79899 Other long term (current) drug therapy: Secondary | ICD-10-CM | POA: Diagnosis not present

## 2019-03-14 DIAGNOSIS — R112 Nausea with vomiting, unspecified: Secondary | ICD-10-CM | POA: Diagnosis not present

## 2019-03-14 DIAGNOSIS — N179 Acute kidney failure, unspecified: Secondary | ICD-10-CM | POA: Diagnosis present

## 2019-03-14 DIAGNOSIS — I4821 Permanent atrial fibrillation: Secondary | ICD-10-CM | POA: Diagnosis not present

## 2019-03-14 DIAGNOSIS — R5381 Other malaise: Secondary | ICD-10-CM | POA: Diagnosis present

## 2019-03-14 LAB — CBC
HCT: 32.1 % — ABNORMAL LOW (ref 36.0–46.0)
Hemoglobin: 9.8 g/dL — ABNORMAL LOW (ref 12.0–15.0)
MCH: 28.6 pg (ref 26.0–34.0)
MCHC: 30.5 g/dL (ref 30.0–36.0)
MCV: 93.6 fL (ref 80.0–100.0)
Platelets: 326 10*3/uL (ref 150–400)
RBC: 3.43 MIL/uL — ABNORMAL LOW (ref 3.87–5.11)
RDW: 14.6 % (ref 11.5–15.5)
WBC: 7.3 10*3/uL (ref 4.0–10.5)
nRBC: 0 % (ref 0.0–0.2)

## 2019-03-14 LAB — COMPREHENSIVE METABOLIC PANEL
ALT: 13 U/L (ref 0–44)
AST: 13 U/L — ABNORMAL LOW (ref 15–41)
Albumin: 3.5 g/dL (ref 3.5–5.0)
Alkaline Phosphatase: 64 U/L (ref 38–126)
Anion gap: 13 (ref 5–15)
BUN: 30 mg/dL — ABNORMAL HIGH (ref 8–23)
CO2: 24 mmol/L (ref 22–32)
Calcium: 9.2 mg/dL (ref 8.9–10.3)
Chloride: 104 mmol/L (ref 98–111)
Creatinine, Ser: 1.48 mg/dL — ABNORMAL HIGH (ref 0.44–1.00)
GFR calc Af Amer: 36 mL/min — ABNORMAL LOW (ref >60)
GFR calc non Af Amer: 31 mL/min — ABNORMAL LOW (ref >60)
Glucose, Bld: 100 mg/dL — ABNORMAL HIGH (ref 70–99)
Potassium: 4.1 mmol/L (ref 3.5–5.1)
Sodium: 141 mmol/L (ref 135–145)
Total Bilirubin: 0.5 mg/dL (ref 0.3–1.2)
Total Protein: 7.3 g/dL (ref 6.5–8.1)

## 2019-03-14 LAB — TROPONIN I (HIGH SENSITIVITY): Troponin I (High Sensitivity): 5 ng/L (ref <18)

## 2019-03-14 LAB — SARS CORONAVIRUS 2 (TAT 6-24 HRS): SARS Coronavirus 2: NEGATIVE

## 2019-03-14 MED ORDER — PANTOPRAZOLE SODIUM 40 MG PO TBEC
40.0000 mg | DELAYED_RELEASE_TABLET | Freq: Every day | ORAL | Status: DC
  Administered 2019-03-14 – 2019-03-15 (×2): 40 mg via ORAL
  Filled 2019-03-14 (×2): qty 1

## 2019-03-14 MED ORDER — AMIODARONE HCL 200 MG PO TABS
200.0000 mg | ORAL_TABLET | Freq: Every day | ORAL | Status: DC
  Administered 2019-03-14 – 2019-03-15 (×2): 200 mg via ORAL
  Filled 2019-03-14 (×2): qty 1

## 2019-03-14 MED ORDER — ENOXAPARIN SODIUM 30 MG/0.3ML ~~LOC~~ SOLN
30.0000 mg | SUBCUTANEOUS | Status: DC
  Administered 2019-03-14 – 2019-03-18 (×5): 30 mg via SUBCUTANEOUS
  Filled 2019-03-14 (×6): qty 0.3

## 2019-03-14 MED ORDER — SODIUM POLYSTYRENE SULFONATE 15 GM/60ML PO SUSP: 15.0000 g | Freq: Once | ORAL | Status: DC

## 2019-03-14 MED ORDER — ONDANSETRON HCL 4 MG/2ML IJ SOLN: 4.0000 mg | Freq: Four times a day (QID) | INTRAMUSCULAR | Status: DC | PRN

## 2019-03-14 MED ORDER — LEVOCETIRIZINE DIHYDROCHLORIDE 5 MG PO TABS: 5.0000 mg | ORAL_TABLET | Freq: Every evening | ORAL | Status: DC

## 2019-03-14 MED ORDER — METOPROLOL SUCCINATE ER 50 MG PO TB24
50.0000 mg | ORAL_TABLET | Freq: Every day | ORAL | Status: DC
  Administered 2019-03-15: 50 mg via ORAL
  Filled 2019-03-14: qty 2
  Filled 2019-03-14: qty 1

## 2019-03-14 MED ORDER — DILTIAZEM HCL ER COATED BEADS 180 MG PO CP24
180.0000 mg | ORAL_CAPSULE | Freq: Every day | ORAL | Status: DC
  Administered 2019-03-15 – 2019-03-19 (×5): 180 mg via ORAL
  Filled 2019-03-14 (×7): qty 1

## 2019-03-14 MED ORDER — MECLIZINE HCL 12.5 MG PO TABS: 25.0000 mg | ORAL_TABLET | Freq: Every day | ORAL | Status: DC | PRN

## 2019-03-14 MED ORDER — ONDANSETRON HCL 4 MG PO TABS: 4.0000 mg | ORAL_TABLET | Freq: Four times a day (QID) | ORAL | Status: DC | PRN

## 2019-03-14 MED ORDER — SODIUM CHLORIDE 0.9% FLUSH
3.0000 mL | Freq: Two times a day (BID) | INTRAVENOUS | Status: DC
  Administered 2019-03-14 – 2019-03-18 (×9): 3 mL via INTRAVENOUS

## 2019-03-14 MED ORDER — ALPRAZOLAM 0.25 MG PO TABS
0.2500 mg | ORAL_TABLET | Freq: Two times a day (BID) | ORAL | Status: DC | PRN
  Administered 2019-03-14 – 2019-03-18 (×4): 0.25 mg via ORAL
  Filled 2019-03-14 (×4): qty 1

## 2019-03-14 MED ORDER — DICYCLOMINE HCL 10 MG PO CAPS
10.0000 mg | ORAL_CAPSULE | ORAL | Status: DC | PRN
  Administered 2019-03-16: 10 mg via ORAL
  Filled 2019-03-14: qty 1

## 2019-03-14 MED ORDER — ACETAMINOPHEN 325 MG PO TABS
650.0000 mg | ORAL_TABLET | Freq: Four times a day (QID) | ORAL | Status: DC | PRN
  Administered 2019-03-14: 650 mg via ORAL
  Filled 2019-03-14: qty 2

## 2019-03-14 MED ORDER — SODIUM CHLORIDE 0.9% FLUSH: 3.0000 mL | INTRAVENOUS | Status: DC | PRN

## 2019-03-14 MED ORDER — LORATADINE 10 MG PO TABS
10.0000 mg | ORAL_TABLET | Freq: Every day | ORAL | Status: DC
  Administered 2019-03-14 – 2019-03-19 (×6): 10 mg via ORAL
  Filled 2019-03-14 (×6): qty 1

## 2019-03-14 MED ORDER — SODIUM CHLORIDE 0.9 % IV SOLN: 250.0000 mL | INTRAVENOUS | Status: DC | PRN

## 2019-03-14 MED ORDER — FUROSEMIDE 10 MG/ML IJ SOLN
40.0000 mg | Freq: Once | INTRAMUSCULAR | Status: AC
  Administered 2019-03-14: 40 mg via INTRAVENOUS
  Filled 2019-03-14: qty 4

## 2019-03-14 NOTE — ED Notes (Signed)
Call for report  Unable to give report

## 2019-03-14 NOTE — H&P (Addendum)
TRH H&P    Patient Demographics:    Renee Blake, is a 85 y.o. female  MRN: [id number]  DOB - 1928-10-15  Admit Date - 03/13/2019  Referring MD/NP/PA: Nanda Quinton  Outpatient Primary MD for the patient is [name], MD  Patient coming from: Home  Chief complaint-generalized weakness   HPI:    Renee Blake  is a 85 y.o. female, with history of chronic atrial fibrillation, not on anticoagulation due to prior history of GI bleed, chronic diastolic CHF, chronic kidney disease stage III, endometrial carcinoma status post prior radiation, followed by [facility] via [place] who was just discharged from hospital on 03/07/2019, after she was treated for acute on chronic diastolic CHF exacerbation today came back to hospital for intermittent episodes of shortness of breath with warm sensation throughout the body.  Patient says that she get these episodes where she would feel hot flash throughout the body, followed by nausea but no vomiting.  She is very vague about shortness of breath.  Today she became short of breath so EMS was called, she was placed on 2 L of nasal cannula by EMS her room air O2 sats were 88% at home. Patient denies fever or chills. Denies abdominal pain or dysuria Chest x-ray showed mild pulmonary edema, BNP 478 In the ED patient received Lasix 40 mg IV x1    Review of systems:    In addition to the HPI above,    All other systems reviewed and are negative.    Past History of the following :    Past Medical History:  Diagnosis Date  . Acute blood loss anemia   . Atrial fibrillation ([place])    a. diagnosed in 12/2018. Rate-control pursued given not a candidate for anticoagulation  . Chronic diarrhea   . Dilation of biliary tract    biliary duct per 02/26/13 CT  . Duodenal stricture   . Esophageal stricture   . Gastric ulcer   . GI bleed   . Hyperlipemia   . Hypertension   . Microscopic  colitis   . Nephrolithiasis   . Pancreatic duct dilated 02/26/13  . Schatzki's ring   . Skin cancer   . Sliding hiatal hernia   . Vertigo       Past Surgical History:  Procedure Laterality Date  . BIOPSY  07/28/2017   Procedure: BIOPSY;  Surgeon: Rogene Houston, MD;  [facility];  Service: Endoscopy;;  rectum  . COLONOSCOPY N/A 07/28/2017   Procedure: COLONOSCOPY;  Surgeon: Rogene Houston, MD;  [facility];  Service: Endoscopy;  Laterality: N/A;  . ESOPHAGOGASTRODUODENOSCOPY N/A 02/27/2013   Procedure: ESOPHAGOGASTRODUODENOSCOPY (EGD);  Surgeon: Ladene Artist, MD;  Location: [facility] ENDOSCOPY;  Service: Endoscopy;  Laterality: N/A;  . ESOPHAGOGASTRODUODENOSCOPY N/A 03/28/2013   Procedure: ESOPHAGOGASTRODUODENOSCOPY (EGD);  Surgeon: Rogene Houston, MD;  [facility];  Service: Endoscopy;  Laterality: N/A;  . HOT HEMOSTASIS N/A 02/27/2013   Procedure: HOT HEMOSTASIS (ARGON PLASMA COAGULATION/BICAP);  Surgeon: Ladene Artist, MD;  Location: [facility]  ENDOSCOPY;  Service: Endoscopy;  Laterality: N/A;  . NOSE SURGERY     for skin cancer  . POLYPECTOMY  07/28/2017   Procedure: POLYPECTOMY;  Surgeon: Rogene Houston, MD;  [facility];  Service: Endoscopy;;  colon       Social History:      Social History   Tobacco Use  . Smoking status: Never Smoker  . Smokeless tobacco: Never Used  Substance Use Topics  . Alcohol use: No    Alcohol/week: 0.0 standard drinks       Family History :     Family History  Problem Relation Age of Onset  . Heart attack Father       Home Medications:   Prior to Admission medications   Medication Sig Start Date End Date Taking? Authorizing Provider  acetaminophen (TYLENOL) 325 MG tablet Take 2 tablets (650 mg total) by mouth every 6 (six) hours as needed for mild pain, fever or headache (or Fever >/= 101). 02/06/19  Yes Emokpae, Courage, MD  ALPRAZolam (XANAX) 0.25 MG tablet Take 1 tablet (0.25 mg  total) by mouth 2 (two) times daily as needed. for anxiety 02/06/19  Yes Emokpae, Courage, MD  amiodarone (PACERONE) 200 MG tablet Take 1 tablet (200 mg total) by mouth daily. 03/07/19  Yes Emokpae, Courage, MD  dicyclomine (BENTYL) 10 MG capsule Take 10 mg by mouth every 4 (four) hours as needed for spasms.   Yes [provider]  diltiazem (CARDIZEM CD) 180 MG 24 hr capsule Take 1 capsule (180 mg total) by mouth daily. 02/18/19  Yes Strader, [place] M, PA-C  furosemide (LASIX) 40 MG tablet Take 1 tablet (40 mg total) by mouth daily. May take additional dose if shortness of breath or fluid buildup 03/07/19  Yes Emokpae, Courage, MD  levocetirizine (XYZAL) 5 MG tablet Take 5 mg by mouth every evening.   Yes [provider]  meclizine (ANTIVERT) 25 MG tablet Take 25 mg by mouth daily as needed for dizziness.  07/11/17  Yes [provider]  metoprolol succinate (TOPROL-XL) 25 MG 24 hr tablet Take 2 tablets (50 mg total) by mouth daily. 03/07/19 04/06/19 Yes Emokpae, Courage, MD  ondansetron (ZOFRAN) 4 MG tablet Take 4 mg by mouth every 8 (eight) hours as needed for nausea or vomiting.   Yes [provider]  pantoprazole (PROTONIX) 40 MG tablet Take 1 tablet (40 mg total) by mouth daily. 02/06/19  Yes Roxan Hockey, MD     Allergies:     Allergies  Allergen Reactions  . Clonazepam Other (See Comments)    Pruritis  . Codeine Nausea And Vomiting and Other (See Comments)    Reaction unknown  . Sulfa Antibiotics Other (See Comments)    Reactions unknown     Physical Exam:   Vitals  Blood pressure 103/68, pulse 92, temperature 97.8 F (36.6 C), temperature source Oral, resp. rate 19, height 5\' 4"  (1.626 m), weight 49.4 kg, SpO2 93 %.  1.  General: Appears in no acute distress  2. Psychiatric: Alert, oriented x3, intact insight and judgment  3. Neurologic: Cranial nerves II through XII grossly intact, moving all extremities  4. HEENMT:  Atraumatic  normocephalic, extra ocular muscles are intact  5. Respiratory : Clear to auscultation bilaterally  6. Cardiovascular : S1-S2, regular, no murmur auscultated  7. Gastrointestinal:  Abdomen is soft, nontender, no organomegaly      Data Review:    CBC Recent Labs  Lab 03/07/19 0621 03/13/19 2137  WBC 5.1 9.2  HGB 9.4* 9.9*  HCT 31.0* 33.1*  PLT 281 328  MCV 93.4 94.8  MCH 28.3 28.4  MCHC 30.3 29.9*  RDW 14.0 14.7   ------------------------------------------------------------------------------------------------------------------  Results for orders placed or performed during the hospital encounter of 03/13/19 (from the past 48 hour(s))  CBC     Status: Abnormal   Collection Time: 03/13/19  9:37 PM  Result Value Ref Range   WBC 9.2 4.0 - 10.5 K/uL   RBC 3.49 (L) 3.87 - 5.11 MIL/uL   Hemoglobin 9.9 (L) 12.0 - 15.0 g/dL   HCT 33.1 (L) 36.0 - 46.0 %   MCV 94.8 80.0 - 100.0 fL   MCH 28.4 26.0 - 34.0 pg   MCHC 29.9 (L) 30.0 - 36.0 g/dL   RDW 14.7 11.5 - 15.5 %   Platelets 328 150 - 400 K/uL   nRBC 0.0 0.0 - 0.2 %    Comment: Performed at [facility], [address], [place], [place] [id number]  Basic metabolic panel     Status: Abnormal   Collection Time: 03/13/19  9:37 PM  Result Value Ref Range   Sodium 140 135 - 145 mmol/L   Potassium 5.6 (H) 3.5 - 5.1 mmol/L   Chloride 105 98 - 111 mmol/L   CO2 26 22 - 32 mmol/L   Glucose, Bld 118 (H) 70 - 99 mg/dL   BUN 36 (H) 8 - 23 mg/dL   Creatinine, Ser 1.77 (H) 0.44 - 1.00 mg/dL   Calcium 9.1 8.9 - 10.3 mg/dL   GFR calc non Af Amer 25 (L) >60 mL/min   GFR calc Af Amer 29 (L) >60 mL/min   Anion gap 9 5 - 15    Comment: Performed at [facility], [address], [place], [place] [address]  Troponin I (High Sensitivity)     Status: None   Collection Time: 03/13/19  9:37 PM  Result Value Ref Range   Troponin I (High Sensitivity) 5 <18 ng/L    Comment: (NOTE) Elevated high sensitivity troponin I (hsTnI) values and  significant  changes across serial measurements may suggest ACS but many other  chronic and acute conditions are known to elevate hsTnI results.  Refer to the "Links" section for chest pain algorithms and additional  guidance. Performed at [facility], [address], [place], [place] [address]   Brain natriuretic peptide     Status: Abnormal   Collection Time: 03/13/19  9:37 PM  Result Value Ref Range   B Natriuretic Peptide 478.0 (H) 0.0 - 100.0 pg/mL    Comment: Performed at [facility], [address] [place], [place] [address]  Troponin I (High Sensitivity)     Status: None   Collection Time: 03/14/19 12:10 AM  Result Value Ref Range   Troponin I (High Sensitivity) 5 <18 ng/L    Comment: (NOTE) Elevated high sensitivity troponin I (hsTnI) values and significant  changes across serial measurements may suggest ACS but many other  chronic and acute conditions are known to elevate hsTnI results.  Refer to the "Links" section for chest pain algorithms and additional  guidance. Performed at [facility], [address], [place], [place] [address]     Chemistries  Recent Labs  Lab 03/07/19 [phone] 03/13/19 2137  NA 142 140  K 4.1 5.6*  CL 106 105  CO2 25 26  GLUCOSE 91 118*  BUN 17 36*  CREATININE 1.40* 1.77*  CALCIUM 8.9 9.1   ------------------------------------------------------------------------------------------------------------------  ------------------------------------------------------------------------------------------------------------------ GFR: Estimated  Creatinine Clearance: 17.1 mL/min (A) (by C-G formula based on SCr of 1.77 mg/dL (H)). Liver Function Tests: No results for input(s): AST, ALT, ALKPHOS, BILITOT, PROT, ALBUMIN in the last 168 hours. No results for input(s): LIPASE, AMYLASE in the last 168 hours. No results for input(s): AMMONIA in the last 168 hours. Coagulation Profile: No results for input(s): INR, PROTIME in the last 168 hours. Cardiac  Enzymes: No results for input(s): CKTOTAL, CKMB, CKMBINDEX, TROPONINI in the last 168 hours. BNP (last 3 results) No results for input(s): PROBNP in the last 8760 hours. HbA1C: No results for input(s): HGBA1C in the last 72 hours. CBG: No results for input(s): GLUCAP in the last 168 hours. Lipid Profile: No results for input(s): CHOL, HDL, LDLCALC, TRIG, CHOLHDL, LDLDIRECT in the last 72 hours. Thyroid Function Tests: No results for input(s): TSH, T4TOTAL, FREET4, T3FREE, THYROIDAB in the last 72 hours. Anemia Panel: No results for input(s): VITAMINB12, FOLATE, FERRITIN, TIBC, IRON, RETICCTPCT in the last 72 hours.  --------------------------------------------------------------------------------------------------------------- Urine analysis:    Component Value Date/Time   COLORURINE YELLOW 01/27/2019 1533   APPEARANCEUR CLOUDY (A) 01/27/2019 1533   LABSPEC 1.005 01/27/2019 1533   PHURINE 7.0 01/27/2019 1533   GLUCOSEU NEGATIVE 01/27/2019 1533   HGBUR SMALL (A) 01/27/2019 1533   BILIRUBINUR NEGATIVE 01/27/2019 1533   KETONESUR NEGATIVE 01/27/2019 1533   PROTEINUR NEGATIVE 01/27/2019 1533   UROBILINOGEN 0.2 08/22/2014 1300   NITRITE NEGATIVE 01/27/2019 1533   LEUKOCYTESUR LARGE (A) 01/27/2019 1533      Imaging Results:    DG Chest Portable 1 View  Result Date: 03/13/2019 CLINICAL DATA:  Shortness of breath EXAM: PORTABLE CHEST 1 VIEW COMPARISON:  03/07/2019 FINDINGS: Stable cardiomegaly. Calcified aortic knob. Mildly prominent interstitial markings are similar to prior. No new focal airspace consolidation. No significant pleural fluid collection. No pneumothorax. IMPRESSION: Cardiomegaly with persistent increased interstitial markings, which could reflect a component of edema. No new focal airspace consolidation. Electronically Signed   By: Davina Poke M.D.   On: 03/13/2019 22:18    My personal review of EKG: Rhythm atrial fibrillation, RBBB   Assessment & Plan:     Active Problems:   Acute exacerbation of CHF (congestive heart failure) ([place]) DNR present on admission  1. Acute hypoxic respiratory failure-secondary to acute exacerbation of diastolic CHF-patient presenting with intermittent episodes of dyspnea, associated with hot flash, nausea but no vomiting.  Chest x-ray shows mild pulmonary edema, patient received Lasix 40 mg IV in the ED.  Patient does not appear to be in flash pulmonary edema.  Follow renal function in a.m., will hold further dose of Lasix.  2. Hot flashes-patient is complaining of episodes of warm sensation throughout her body which has been happening intermittently.  Unclear etiology at this time.  Continue to monitor.  3. Hyperkalemia-potassium is 5.6, will give 1 dose of Kayexalate 15 g p.o. x1.  Follow BMP in a.m.  4. Chronic atrial fibrillation-heart rate is controlled with amiodarone, Toprol-XL, Cardizem CD 180 mg daily.CHA2DS2VASc score is 5, patient is not a candidate for anticoagulation due to prior history of GI bleed from rectal carcinoma, duodenal ulcers with chronic admissions anemia due to presumed chronic GI bleeding.  Patient is not a candidate for anticoagulation.  5. Acute kidney injury on CKD stage III- patient's creatinine 1.77, baseline creatinine around 1.4.  Started on IV Lasix as above.  Follow renal function closely in a.m.  6. History of anorectal carcinoma-status post radiation, patient followed by [facility] onc[facility], GYN and medical  oncology team from [facility] by [place].  7. DNR present on admission- confirmed with patient.   DVT Prophylaxis-   Lovenox   AM Labs Ordered, also please review Full Orders  Family Communication: Admission, patients condition and plan of care including tests being ordered have been discussed with the patient  who indicate understanding and agree with the plan and Code Status.  Code Status: DNR  Admission status: Inpatient: Based on patients clinical presentation and evaluation of  above clinical data, I have made determination that patient meets Inpatient criteria at this time.  Time spent in minutes : 60 minutes   Gagan S Lama M.D

## 2019-03-14 NOTE — ED Notes (Signed)
Report to Christy, RN

## 2019-03-14 NOTE — ED Notes (Signed)
Given breakfast tray. 

## 2019-03-14 NOTE — Telephone Encounter (Signed)
This encounter was created in error - please disregard.

## 2019-03-14 NOTE — Progress Notes (Signed)
Seen and examined.  Admitted after midnight secondary to shortness of breath and generalized weakness.  Found with mild heart failure exacerbation.  Received IV Lasix in the ED and reported significant improvement.  No chest pain, stable vital signs.  Eating breakfast tray.  Refer to H&P written by Dr. Darrick Meigs on 03/14/2019 for further info/details.    Plan: -Anticipate ability to go home on 05/15/2018. -Continue daily weights, strict intake and output and diuretics. -Follow clinical response. -patient is DNR.  Barton Dubois MD [phone]

## 2019-03-14 NOTE — ED Notes (Signed)
Continues to await bed

## 2019-03-14 NOTE — ED Notes (Signed)
Awaiting bed assignment.

## 2019-03-14 NOTE — ED Notes (Signed)
Pt reports that when she is discharged home, within three days her body burns  She reports that she has spoken with her doctors about it and wonders what it is

## 2019-03-14 NOTE — ED Notes (Signed)
Patient's bed linen changed along with patient's brief. Patient has a pure Wick in place. Patient given warm blankets and patient made comfortable.

## 2019-03-14 NOTE — ED Notes (Signed)
Daughter, [name], [phone] would like to be called with DC planning info  States her mother is calling people telling them she will be discharged tomorrow and that "she doesn't comprehend things well"  [name], Daughter, would appreciate a call from DC planning to co-ordinate

## 2019-03-15 ENCOUNTER — Encounter (HOSPITAL_COMMUNITY): Payer: Self-pay | Admitting: Family Medicine

## 2019-03-15 DIAGNOSIS — R630 Anorexia: Secondary | ICD-10-CM

## 2019-03-15 DIAGNOSIS — D649 Anemia, unspecified: Secondary | ICD-10-CM

## 2019-03-15 DIAGNOSIS — I4819 Other persistent atrial fibrillation: Secondary | ICD-10-CM

## 2019-03-15 DIAGNOSIS — R0902 Hypoxemia: Secondary | ICD-10-CM | POA: Diagnosis present

## 2019-03-15 DIAGNOSIS — I5033 Acute on chronic diastolic (congestive) heart failure: Secondary | ICD-10-CM

## 2019-03-15 MED ORDER — BOOST / RESOURCE BREEZE PO LIQD CUSTOM
1.0000 | Freq: Three times a day (TID) | ORAL | Status: DC
  Administered 2019-03-15 – 2019-03-18 (×6): 1 via ORAL

## 2019-03-15 MED ORDER — ADULT MULTIVITAMIN W/MINERALS CH
1.0000 | ORAL_TABLET | Freq: Every day | ORAL | Status: DC
  Administered 2019-03-15 – 2019-03-19 (×5): 1 via ORAL
  Filled 2019-03-15 (×5): qty 1

## 2019-03-15 MED ORDER — PANTOPRAZOLE SODIUM 40 MG PO TBEC
40.0000 mg | DELAYED_RELEASE_TABLET | Freq: Every evening | ORAL | Status: DC
  Administered 2019-03-16 – 2019-03-17 (×2): 40 mg via ORAL
  Filled 2019-03-15 (×2): qty 1

## 2019-03-15 MED ORDER — FUROSEMIDE 40 MG PO TABS
40.0000 mg | ORAL_TABLET | Freq: Every day | ORAL | Status: DC
  Administered 2019-03-16 – 2019-03-19 (×4): 40 mg via ORAL
  Filled 2019-03-15 (×4): qty 1

## 2019-03-15 MED ORDER — METOPROLOL SUCCINATE ER 50 MG PO TB24
50.0000 mg | ORAL_TABLET | Freq: Every evening | ORAL | Status: DC
  Administered 2019-03-16 – 2019-03-17 (×2): 50 mg via ORAL
  Filled 2019-03-15 (×2): qty 1

## 2019-03-15 MED ORDER — FUROSEMIDE 10 MG/ML IJ SOLN
40.0000 mg | Freq: Once | INTRAMUSCULAR | Status: AC
  Administered 2019-03-15: 40 mg via INTRAVENOUS
  Filled 2019-03-15: qty 4

## 2019-03-15 MED ORDER — AMIODARONE HCL 200 MG PO TABS
200.0000 mg | ORAL_TABLET | Freq: Every evening | ORAL | Status: DC
  Administered 2019-03-16 – 2019-03-17 (×2): 200 mg via ORAL
  Filled 2019-03-15 (×2): qty 1

## 2019-03-15 NOTE — Consult Note (Signed)
Cardiology Consultation:   Patient ID: Renee Blake; [id number]; 1928-04-29   Admit date: 03/13/2019 Date of Consult: 03/15/2019  Primary Care Provider: [name], MD Primary Cardiologist: Kate Sable, MD Primary Electrophysiologist: None   Patient Profile:   Renee Blake is an 85 y.o. female with a history of chronic diastolic heart failure, hypertension, GI bleed with chronic anemia, anorectal cancer status post radiation, and persistent atrial fibrillation who is being seen today for the evaluation of shortness of breath and intermittent spells at the request of Dr. Roger Shelter.  History of Present Illness:   [name] presents to the hospital reporting intermittent shortness of breath and episodes of flushing as well as anorexia.  She was last seen in the office by Ms. Strader PA-C back in mid November with no modifications made to heart rate control regimen of atrial fibrillation.  Patient's daughter present today states that her mother reports the spells not long after taking her morning medications which include Toprol-XL, amiodarone, and Cardizem CD.  She has generally lost weight, appetite has not been good.  She was found to be mildly hypoxic and placed on oxygen at presentation, however this has resolved.  She was given a dose of IV Lasix with BNP of 478 and chest x-ray showing mild pulmonary edema.  He does not report any active chest pain or sense of palpitations.  No syncope.  No reported fevers or chills.  Past Medical History:  Diagnosis Date  . Acute blood loss anemia   . Atrial fibrillation ([place])    a. diagnosed in 12/2018. Rate-control pursued given not a candidate for anticoagulation  . Chronic diarrhea   . Dilation of biliary tract   . Duodenal stricture   . Esophageal stricture   . Gastric ulcer   . GI bleed   . Hyperlipemia   . Hypertension   . Microscopic colitis   . Nephrolithiasis   . Pancreatic duct dilated   . Schatzki's ring   . Skin  cancer   . Sliding hiatal hernia   . Vertigo     Past Surgical History:  Procedure Laterality Date  . BIOPSY  07/28/2017   Procedure: BIOPSY;  Surgeon: Rogene Houston, MD;  [facility];  Service: Endoscopy;;  rectum  . COLONOSCOPY N/A 07/28/2017   Procedure: COLONOSCOPY;  Surgeon: Rogene Houston, MD;  [facility]: [facility];  Service: Endoscopy;  Laterality: N/A;  . ESOPHAGOGASTRODUODENOSCOPY N/A 02/27/2013   Procedure: ESOPHAGOGASTRODUODENOSCOPY (EGD);  Surgeon: Ladene Artist, MD;  Location: [facility] ENDOSCOPY;  Service: Endoscopy;  Laterality: N/A;  . ESOPHAGOGASTRODUODENOSCOPY N/A 03/28/2013   Procedure: ESOPHAGOGASTRODUODENOSCOPY (EGD);  Surgeon: Rogene Houston, MD;  Location: [facility];  Service: Endoscopy;  Laterality: N/A;  . HOT HEMOSTASIS N/A 02/27/2013   Procedure: HOT HEMOSTASIS (ARGON PLASMA COAGULATION/BICAP);  Surgeon: Ladene Artist, MD;  Location: [facility] ENDOSCOPY;  Service: Endoscopy;  Laterality: N/A;  . NOSE SURGERY     for skin cancer  . POLYPECTOMY  07/28/2017   Procedure: POLYPECTOMY;  Surgeon: Rogene Houston, MD;  Location: [facility];  Service: Endoscopy;;  colon      Inpatient Medications: Scheduled Meds: . amiodarone  200 mg Oral Daily  . diltiazem  180 mg Oral Daily  . enoxaparin (LOVENOX) injection  30 mg Subcutaneous Q24H  . loratadine  10 mg Oral Daily  . [START ON 03/16/2019] metoprolol succinate  50 mg Oral QPM  . [START ON 03/16/2019] pantoprazole  40 mg Oral  QPM  . sodium chloride flush  3 mL Intravenous Q12H  . sodium polystyrene  15 g Oral Once   Continuous Infusions: . sodium chloride     PRN Meds: sodium chloride, acetaminophen, ALPRAZolam, dicyclomine, meclizine, ondansetron **OR** ondansetron (ZOFRAN) IV, sodium chloride flush  Allergies:    Allergies  Allergen Reactions  . Clonazepam Other (See Comments)    Pruritis  . Codeine Nausea And Vomiting and Other (See Comments)    Reaction unknown  . Sulfa Antibiotics  Other (See Comments)    Reactions unknown    Social History:   Social History   Socioeconomic History  . Marital status: Widowed    Spouse name: Not on file  . Number of children: Not on file  . Years of education: Not on file  . Highest education level: Not on file  Occupational History  . Not on file  Tobacco Use  . Smoking status: Never Smoker  . Smokeless tobacco: Never Used  Substance and Sexual Activity  . Alcohol use: No    Alcohol/week: 0.0 standard drinks  . Drug use: No  . Sexual activity: Not on file  Other Topics Concern  . Not on file  Social History Narrative  . Not on file   Social Determinants of Health   Financial Resource Strain:   . Difficulty of Paying Living Expenses: Not on file  Food Insecurity:   . Worried About Charity fundraiser in the Last Year: Not on file  . Ran Out of Food in the Last Year: Not on file  Transportation Needs:   . Lack of Transportation (Medical): Not on file  . Lack of Transportation (Non-Medical): Not on file  Physical Activity:   . Days of Exercise per Week: Not on file  . Minutes of Exercise per Session: Not on file  Stress:   . Feeling of Stress : Not on file  Social Connections:   . Frequency of Communication with Friends and Family: Not on file  . Frequency of Social Gatherings with Friends and Family: Not on file  . Attends Religious Services: Not on file  . Active Member of Clubs or Organizations: Not on file  . Attends Archivist Meetings: Not on file  . Marital Status: Not on file  Intimate Partner Violence:   . Fear of Current or Ex-Partner: Not on file  . Emotionally Abused: Not on file  . Physically Abused: Not on file  . Sexually Abused: Not on file    Family History:   The patient's family history includes Heart attack in her father.  ROS:  Please see the history of present illness.  All other ROS reviewed and negative.     Physical Exam/Data:   Vitals:   03/14/19 2017 03/14/19  2118 03/15/19 0510 03/15/19 0800  BP: 116/82  112/75 124/83  Pulse: 95  78 98  Resp: 20  18 18   Temp: 97.9 F (36.6 C)  (!) 97.4 F (36.3 C) 97.9 F (36.6 C)  TempSrc: Oral  Oral Oral  SpO2: 100% 100% 98% 100%  Weight: 50.3 kg     Height: 5\' 4"  (1.626 m)      No intake or output data in the 24 hours ending 03/15/19 1224 Filed Weights   03/13/19 1906 03/14/19 2017  Weight: 49.4 kg 50.3 kg   Body mass index is 19.03 kg/m.   Gen: Frail-appearing elderly woman, no distress. HEENT: Conjunctiva and lids normal, oropharynx clear with moist mucosa. Neck: Supple, no  elevated JVP or carotid bruits, no thyromegaly. Lungs: Decreased breath sounds without crackles, nonlabored breathing at rest. Cardiac: Irregularly irregular, no S3, soft systolic murmur. Abdomen: Soft, nontender, bowel sounds present. Extremities: No pitting edema, distal pulses 2+. Skin: Warm and dry. Musculoskeletal: Kyphosis noted. Neuropsychiatric: Alert and oriented x3, affect grossly appropriate.  EKG:  An ECG dated 03/13/2019 was personally reviewed today and demonstrated:  Rate controlled atrial fibrillation with right bundle branch block, left anterior fascicular block.  Telemetry:  I personally reviewed telemetry which shows atrial fibrillation.  Relevant CV Studies:  Echocardiogram 01/28/2019:  1. Left ventricular ejection fraction, by visual estimation, is 60 to 65%. The left ventricle has normal function. There is no left ventricular hypertrophy.  2. Elevated left atrial pressure.  3. Left ventricular diastolic parameters are consistent with Grade II diastolic dysfunction (pseudonormalization).  4. Global right ventricle has normal systolic function.The right ventricular size is normal. No increase in right ventricular wall thickness.  5. Left atrial size was severely dilated.  6. Right atrial size was mildly dilated.  7. The mitral valve is abnormal. Moderate mitral valve regurgitation. No evidence of  mitral stenosis.  8. The MR vena contracta is 0.6 cm. The MV/AV TVI ratio is 1.1. Findings support moderate mitral regurgitation.  9. The tricuspid valve is normal in structure. Tricuspid valve regurgitation is mild. 10. The aortic valve is tricuspid. Aortic valve regurgitation is not visualized. No evidence of aortic valve sclerosis or stenosis. 11. The pulmonic valve was not well visualized. Pulmonic valve regurgitation is mild. 12. Moderately elevated pulmonary artery systolic pressure. 13. The inferior vena cava is normal in size with greater than 50% respiratory variability, suggesting right atrial pressure of 3 mmHg.  Laboratory Data:  Chemistry Recent Labs  Lab 03/13/19 2137 03/14/19 0449  NA 140 141  K 5.6* 4.1  CL 105 104  CO2 26 24  GLUCOSE 118* 100*  BUN 36* 30*  CREATININE 1.77* 1.48*  CALCIUM 9.1 9.2  GFRNONAA 25* 31*  GFRAA 29* 36*  ANIONGAP 9 13    Recent Labs  Lab 03/14/19 0449  PROT 7.3  ALBUMIN 3.5  AST 13*  ALT 13  ALKPHOS 64  BILITOT 0.5   Hematology Recent Labs  Lab 03/13/19 2137 03/14/19 0449  WBC 9.2 7.3  RBC 3.49* 3.43*  HGB 9.9* 9.8*  HCT 33.1* 32.1*  MCV 94.8 93.6  MCH 28.4 28.6  MCHC 29.9* 30.5  RDW 14.7 14.6  PLT 328 326   Cardiac Enzymes Recent Labs  Lab 03/02/19 0455 03/04/19 0417 03/04/19 0552 03/13/19 2137 03/14/19 0010  TROPONINIHS 5 5 5 5 5    BNP Recent Labs  Lab 03/13/19 2137  BNP 478.0*     Radiology/Studies:  DG Chest Portable 1 View  Result Date: 03/13/2019 CLINICAL DATA:  Shortness of breath EXAM: PORTABLE CHEST 1 VIEW COMPARISON:  03/07/2019 FINDINGS: Stable cardiomegaly. Calcified aortic knob. Mildly prominent interstitial markings are similar to prior. No new focal airspace consolidation. No significant pleural fluid collection. No pneumothorax. IMPRESSION: Cardiomegaly with persistent increased interstitial markings, which could reflect a component of edema. No new focal airspace consolidation.  Electronically Signed   By: Davina Poke M.D.   On: 03/13/2019 22:18    Assessment and Plan:   1.  Shortness of breath with intermittent flushing and decreased appetite, temporarily associated with morning medications which include Toprol-XL, Cardizem CD, and amiodarone.  2.  Persistent atrial fibrillation with plan for heart rate control and therefore management of permanent atrial fibrillation.  As discussed previously, she is not felt to be a good candidate for anticoagulation with history of recurrent GI bleeding.  3.  Acute on chronic diastolic heart failure.  BNP moderately increased and chest x-ray showing cardiomegaly with mild interstitial edema.  She was given dose of IV Lasix.  4.  Chronic anemia with history of GI bleed.  Hemoglobin stable at 9.8.  5.  Deconditioning.  Patient's daughter raised possibility of having patient evaluated by PT for potential rehabilitation.  Chart reviewed.  Discussed medications with the patient and her daughter.  We will start by having her take Cardizem CD dose in the morning and the Toprol-XL as well as amiodarone dose in the evening. If she does not tolerate this better, may want to consider stopping amiodarone in case this is associated with her anorexia, although heart rate control has been challenging in the past on AV nodal blockers alone related to low blood pressures.  Continue to observe in the hospital, suggest PT consultation.  Resume outpatient Lasix dose.  Signed, Rozann Lesches, MD  03/15/2019 12:24 PM

## 2019-03-15 NOTE — NC FL2 (Signed)
[place] LEVEL OF CARE SCREENING TOOL     IDENTIFICATION  Patient Name: Renee Blake Birthdate: 1929/01/13 Sex: female Admission Date (Current Location): 03/13/2019  [facility] and [place] Number:  [employer] and Address:  [place] [address], [place]      Provider Number: [id number]  Attending Physician Name and Address:  Deatra James, MD  Relative Name and Phone Number:  Gershon Mussel - daughter [phone]    Current Level of Care: Hospital Recommended Level of Care: [place] Prior Approval Number:    Date Approved/Denied:   PASRR Number: [id number]  Discharge Plan: SNF    Current Diagnoses: Patient Active Problem List   Diagnosis Date Noted  . Hypoxia 03/15/2019  . CHF exacerbation ([place]) 03/14/2019  . Bilateral pleural effusion 03/04/2019  . Chronic atrial fibrillation ([place]) 03/04/2019  . DNR (do not resuscitate) 03/04/2019  . Acute on chronic diastolic congestive heart failure ([place]) 03/02/2019  . Acute on chronic diastolic HF (heart failure) ([place]) 03/02/2019  . Chronic renal failure, stage 3b 03/02/2019  . Acute respiratory failure with hypoxia ([place]) 03/02/2019  . Acute exacerbation of CHF (congestive heart failure) ([place]) 01/27/2019  . Atrial fibrillation with RVR ([place]) 01/27/2019  . Iron deficiency anemia due to chronic blood loss---H/o Gi Bleed 01/27/2019  . Vaginal mass 07/29/2017  . Heme positive stool 07/25/2017  . Cellulitis 12/10/2016  . Facial cellulitis   . Erroneous encounter - disregard 03/17/2015  . AKI (acute kidney injury) ([place])   . Metabolic acidosis 0000000  . Acute renal failure syndrome ([place])   . Renal failure 08/22/2014  . Hyperkalemia 08/22/2014  . UTI (urinary tract infection) 08/22/2014  . Acute renal failure ([place]) 08/22/2014  . Dyspnea 03/15/2014  . Diabetes mellitus ([place]) 06/08/2013  . Acute diastolic congestive heart failure ([place]) 06/07/2013   . Syncope 03/26/2013  . Rectal bleeding 03/26/2013  . Dehydration 03/26/2013  . Generalized weakness 03/26/2013  . Malnutrition of moderate degree ([place]) 03/01/2013  . Diarrhea 02/28/2013  . Loss of weight 02/28/2013  . H/o Prior duodenal ulcer with hemorrhage 02/27/2013  . H/o Prior Lower GI bleed (anorectal Ca) 02/26/2013  . Acute blood loss anemia 02/26/2013  . UTI (lower urinary tract infection) 02/26/2013  . Hypertension 02/26/2013    Orientation RESPIRATION BLADDER Height & Weight     Self, Time, Situation, Place  Normal External catheter Weight: 50.3 kg Height:  5\' 4"  (162.6 cm)  BEHAVIORAL SYMPTOMS/MOOD NEUROLOGICAL BOWEL NUTRITION STATUS      Continent Diet(see discharge summary)  AMBULATORY STATUS COMMUNICATION OF NEEDS Skin   Extensive Assist Verbally Normal                       Personal Care Assistance Level of Assistance  Bathing, Feeding, Dressing Bathing Assistance: Limited assistance Feeding assistance: Independent Dressing Assistance: Limited assistance     Functional Limitations Info  Sight, Hearing, Speech Sight Info: Impaired Hearing Info: Adequate Speech Info: Adequate    SPECIAL CARE FACTORS FREQUENCY                       Contractures Contractures Info: Not present    Additional Factors Info  Code Status, Allergies Code Status Info: DNR Allergies Info: clonazepam  codeine  sulfa           Current Medications (03/15/2019):  This is the current hospital active medication list Current Facility-Administered Medications  Medication Dose  Route Frequency Provider Last Rate Last Admin  . 0.9 %  sodium chloride infusion  250 mL Intravenous PRN Oswald Hillock, MD      . acetaminophen (TYLENOL) tablet 650 mg  650 mg Oral Q6H PRN Oswald Hillock, MD   650 mg at 03/14/19 [id number]  . ALPRAZolam Duanne Moron) tablet 0.25 mg  0.25 mg Oral BID PRN Oswald Hillock, MD   0.25 mg at 03/14/19 2055  . [START ON 03/16/2019] amiodarone (PACERONE) tablet 200 mg   200 mg Oral QPM Satira Sark, MD      . dicyclomine (BENTYL) capsule 10 mg  10 mg Oral Q4H PRN Oswald Hillock, MD      . diltiazem (CARDIZEM CD) 24 hr capsule 180 mg  180 mg Oral Daily Oswald Hillock, MD   180 mg at 03/15/19 0904  . enoxaparin (LOVENOX) injection 30 mg  30 mg Subcutaneous Q24H Oswald Hillock, MD   30 mg at 03/15/19 0904  . [START ON 03/16/2019] furosemide (LASIX) tablet 40 mg  40 mg Oral Daily Satira Sark, MD      . loratadine (CLARITIN) tablet 10 mg  10 mg Oral Daily Barton Dubois, MD   10 mg at 03/15/19 0905  . meclizine (ANTIVERT) tablet 25 mg  25 mg Oral Daily PRN Oswald Hillock, MD      . Derrill Memo ON 03/16/2019] metoprolol succinate (TOPROL-XL) 24 hr tablet 50 mg  50 mg Oral QPM Shahmehdi, Seyed A, MD      . ondansetron (ZOFRAN) tablet 4 mg  4 mg Oral Q6H PRN Oswald Hillock, MD       Or  . ondansetron (ZOFRAN) injection 4 mg  4 mg Intravenous Q6H PRN Oswald Hillock, MD      . Derrill Memo ON 03/16/2019] pantoprazole (PROTONIX) EC tablet 40 mg  40 mg Oral QPM Shahmehdi, Seyed A, MD      . sodium chloride flush (NS) 0.9 % injection 3 mL  3 mL Intravenous Q12H Oswald Hillock, MD   3 mL at 03/15/19 0907  . sodium chloride flush (NS) 0.9 % injection 3 mL  3 mL Intravenous PRN [place], Marge Duncans, MD      . sodium polystyrene (KAYEXALATE) 15 GM/60ML suspension 15 g  15 g Oral Once Oswald Hillock, MD         Discharge Medications: Please see discharge summary for a list of discharge medications.  Relevant Imaging Results:  Relevant Lab Results:   Additional Information SS# [id number]  Boneta Lucks, RN

## 2019-03-15 NOTE — Progress Notes (Signed)
Initial Nutrition Assessment  DOCUMENTATION CODES:      INTERVENTION:  Boost Breeze po BID, each supplement provides 250 kcal and 9 grams of protein   Snack from nursing nourishment room as desired   NUTRITION DIAGNOSIS:   Increased nutrient needs related to acute illness, chronic illness(HF exacerbation) as evidenced by estimated needs.   GOAL:  Patient will meet greater than or equal to 90% of their needs   MONITOR:  PO intake, Labs, Weight trends, Skin, I & O's    REASON FOR ASSESSMENT:  Consult Assessment of nutrition requirement/status   ASSESSMENT: patient is an 85 yo female with hx of HF, hypertension, gastric ulcer, Chronic diarrhea, hiatal hernia, Schatzki's ring, esophageal stricture, anorectal cancer (s/p radiation). She presents with acute exacerbation of HF.  12/3-left PE/-thoracentesis-230 ml yellow colored fluid removed.  Patient reports appetite as fair. She is eating around 50% of meals per her recall. Appetite has been "off" recently. Able to feed herself and verbalize food preferences. She is at risk for malnutrition given her acute on chronic HF and reduced appetite.   Her usual weight 50-53 kg. Per hospital records she has acute wt loss of 1.2 kg (2%) the past 2 weeks. Patient is below desired BMI for age. Patient pursuing SNF d/c per chart. Recommend she continue supplements for at least 30 days after discharge.   Medications reviewed and include:  Lasix,protonix, kayexalate  Labs reviewed:  BMP Latest Ref Rng & Units 03/14/2019 03/13/2019 03/07/2019  Glucose 70 - 99 mg/dL 100(H) 118(H) 91  BUN 8 - 23 mg/dL 30(H) 36(H) 17  Creatinine 0.44 - 1.00 mg/dL 1.48(H) 1.77(H) 1.40(H)  Sodium 135 - 145 mmol/L 141 140 142  Potassium 3.5 - 5.1 mmol/L 4.1 5.6(H) 4.1  Chloride 98 - 111 mmol/L 104 105 106  CO2 22 - 32 mmol/L 24 26 25   Calcium 8.9 - 10.3 mg/dL 9.2 9.1 8.9     NUTRITION - FOCUSED PHYSICAL EXAM: Unable to complete Nutrition-Focused physical exam  at this time.     Diet Order:   Diet Order            Diet Heart Room service appropriate? Yes; Fluid consistency: Thin  Diet effective now              EDUCATION NEEDS:  Education needs have been addressed Skin:  Skin Assessment: Reviewed RN Assessment(MASD- bilateral perineum)  Last BM:  12/13  Height:   Ht Readings from Last 1 Encounters:  03/14/19 5\' 4"  (1.626 m)    Weight:   Wt Readings from Last 1 Encounters:  03/14/19 50.3 kg    Ideal Body Weight:  55 kg  BMI:  Body mass index is 19.03 kg/m.  Estimated Nutritional Needs:   Kcal:  1400-1600  Protein:  60-65 gr  Fluid:  1.4-1.6 liters daily  Colman Cater MS,RD,CSG,LDN Office: [phone] Pager: [phone]

## 2019-03-15 NOTE — Progress Notes (Signed)
PROGRESS NOTE    Patient: Renee Blake                            PCP: [name], MD                    DOB: [birth year]            DOA: 03/13/2019 [id number]             DOS: 03/15/2019, 11:02 AM   LOS: 1 day   Date of Service: The patient was seen and examined on 03/15/2019  Subjective:   The patient was seen and examined this morning, remained stable in bed. Complaining of mild shortness of breath with mild wheezing, currently on 3 L of oxygen, satting > 95%.   Daughter present at bedside.  Reporting, progressive weight loss in the past few weeks, poor appetite, weakness.  Patient reports the daughter confirms that every morning after she takes her medication she develops hot flushes, nausea proceeds with vomiting.  Reports patient has a history of rectal cancer.  Denies of having any frank bleeding rectally.   Brief Narrative:  Per HPI on 12/13 85 y.o. female, with history of chronic atrial fibrillation, not on anticoagulation due to prior history of GI bleed, chronic diastolic CHF, chronic kidney disease stage III, endometrial carcinoma status post prior radiation, followed by [facility] via [place] who was just discharged from hospital on 03/07/2019, after she was treated for acute on chronic diastolic CHF exacerbation today came back to hospital for intermittent episodes of shortness of breath with warm sensation throughout the body.  Patient says that she get these episodes where she would feel hot flash throughout the body, followed by nausea but no vomiting.  She is very vague about shortness of breath.  Today she became short of breath so EMS was called, she was placed on 2 L of nasal cannula by EMS her room air O2 sats were 88% at home. Patient denies fever or chills. Denies abdominal pain or dysuria Chest x-ray showed mild pulmonary edema, BNP 478 In the ED patient received Lasix 40 mg IV x1   03/15/2019: Remained lethargic, severe generalized weaknesses, poor appetite,  nausea but no vomiting.  Remains on 3 L of oxygen satting 100%. Daughter concerned due to history of cancer, progressive unintentional weight loss, getting weak, with multiple hospitalization. Cardiology consulted, GI consulted Patient meets the criteria for inpatient which was changed, PT/OT ordered-patient and family agreeable to rehab, SNF.  Assessment & Plan:   Active Problems:   Acute exacerbation of CHF (congestive heart failure) (HCC)   CHF exacerbation (HCC)   Hypoxia   Acute respiratory failure with hypoxia -likely due to exacerbation of diastolic congestive heart failure -Continue to remain liters of oxygen satting 100% now --> attempting to wean the patient back to room air. -Patient received 1 dose of IV Lasix on the 13th, another dose of IV 40 mg will be given today. -Imaging including chest x-ray was reviewed by pulmonary edema noted,   History of diastolic congestive heart failure/chronic A. Fib -Difficulty with maintaining fluid balance due to hypotension -Difficult with rate control -per cardiology, not chronically anticoagulated due to history of GI bleed -We will continue monitor patient's weight daily, -Duration of dose of IV Lasix 40 mg today... We will monitor her BUN/creatinine -CHADS2VASCr score 5-no chronic anticoagulation due to GI bleed, rectal carcinoma, duodenal ulcers, anemia -Rate control medication  Cardizem, amiodarone, Toprol -Cardiology consulted appreciate and medication changes.  Nausea/hot flash -Post ingestion of morning meds developing nausea, hot flashes, but no persistent vomiting -We will evaluate medication and divide the doses to morning in a.m. -As needed antiemetics -No signs of infection  Poor appetite, unintentional weight loss -History of questionable rectal cancer per daughter, history of endometrial cancer -History of GI bleed,>> H&H remained stable, will repeat Hemoccult -We will order CEA -We will consult GI for  evaluation  -Possible developing intolerance to medication as they are taken in a.m., including her heart meds We will try to divide the medications accordingly to twice daily   History of anorectal cancer/endometrial carcinoma/ -We will evaluate by Hemoccult, CEA -GI will be consulted for evaluatio -patient followed by ration oncology, GYN and medical oncology team from [facility] by [facility]. No recent follow-up  Severe debility -PT/OT will be consulted for evaluation recommendation -The patient and daughter are open to placement for rehab, SNF   Nutritional status:    DietitianDietitian will be consulted     DVT prophylaxis: SCD/Compression stockings Code Status:   Code Status: DNR Family Communication:     The above findings and plan of care has been discussed with patient and family in detail,  they expressed understanding and agreement of above. Disposition Plan:  >3 days Admission status:  NPATIEN -meets the inpatient criteria due hypoxia, diastolic congestive heart failure exacerbation, cachexia, severe debility, atrial fibrillation  Consultants: None  Procedures:   No admission procedures for hospital encounter.  Antimicrobials:  Anti-infectives (From admission, onward)   None       Medication:  . amiodarone  200 mg Oral Daily  . diltiazem  180 mg Oral Daily  . enoxaparin (LOVENOX) injection  30 mg Subcutaneous Q24H  . loratadine  10 mg Oral Daily  . metoprolol succinate  50 mg Oral Daily  . pantoprazole  40 mg Oral Daily  . sodium chloride flush  3 mL Intravenous Q12H  . sodium polystyrene  15 g Oral Once    sodium chloride, acetaminophen, ALPRAZolam, dicyclomine, meclizine, ondansetron **OR** ondansetron (ZOFRAN) IV, sodium chloride flush   Objective:   Vitals:   03/14/19 2017 03/14/19 2118 03/15/19 0510 03/15/19 0800  BP: 116/82  112/75 124/83  Pulse: 95  78 98  Resp: 20  18 18   Temp: 97.9 F (36.6 C)  (!) 97.4 F (36.3 C) 97.9 F (36.6 C)   TempSrc: Oral  Oral Oral  SpO2: 100% 100% 98% 100%  Weight: 50.3 kg     Height: 5\' 4"  (1.626 m)      No intake or output data in the 24 hours ending 03/15/19 1102 Filed Weights   03/13/19 1906 03/14/19 2017  Weight: 49.4 kg 50.3 kg     Examination:   Physical Exam  Constitution: Generalized cachexia, elert, cooperative, in mild distress complaining of shortness of breath, weakness, Psychiatric: Normal and stable mood and affect, cognition intact,   HEENT: Normocephalic, PERRL, otherwise with in Normal limits  Chest:Chest symmetric Cardio vascular:  S1/S2, RRR, No murmure, No Rubs or Gallops  pulmonary: Clear to auscultation bilaterally, respirations unlabored, negative wheezes / crackles Abdomen: Soft, non-tender, non-distended, bowel sounds,no masses, no organomegaly Muscular skeletal: Limited exam - in bed, able to move all 4 extremities, Normal strength,  Neuro: CNII-XII intact. , normal motor and sensation, reflexes intact  Extremities: No pitting edema lower extremities, +2 pulses  Skin: Dry, warm to touch, negative for any Rashes, No open wounds Wounds: per nursing  documentation     LABs:  CBC Latest Ref Rng & Units 03/14/2019 03/13/2019 03/07/2019  WBC 4.0 - 10.5 K/uL 7.3 9.2 5.1  Hemoglobin 12.0 - 15.0 g/dL 9.8(L) 9.9(L) 9.4(L)  Hematocrit 36.0 - 46.0 % 32.1(L) 33.1(L) 31.0(L)  Platelets 150 - 400 K/uL 326 328 281   CMP Latest Ref Rng & Units 03/14/2019 03/13/2019 03/07/2019  Glucose 70 - 99 mg/dL 100(H) 118(H) 91  BUN 8 - 23 mg/dL 30(H) 36(H) 17  Creatinine 0.44 - 1.00 mg/dL 1.48(H) 1.77(H) 1.40(H)  Sodium 135 - 145 mmol/L 141 140 142  Potassium 3.5 - 5.1 mmol/L 4.1 5.6(H) 4.1  Chloride 98 - 111 mmol/L 104 105 106  CO2 22 - 32 mmol/L 24 26 25   Calcium 8.9 - 10.3 mg/dL 9.2 9.1 8.9  Total Protein 6.5 - 8.1 g/dL 7.3 - -  Total Bilirubin 0.3 - 1.2 mg/dL 0.5 - -  Alkaline Phos 38 - 126 U/L 64 - -  AST 15 - 41 U/L 13(L) - -  ALT 0 - 44 U/L 13 - -         SIGNED: Deatra James, MD, FACP, FHM. [facility],  Pager [phone]  If 7PM-7AM, please contact night-coverage [email or web address], Password [facility] 03/15/2019, 11:02 AM

## 2019-03-15 NOTE — TOC Initial Note (Addendum)
Transition of Care [facility]) - Initial/Assessment Note    Patient Details  Name: Renee Blake MRN: [id number] Date of Birth: 12-17-1928  Transition of Care [facility]) CM/SW Contact:    Renee Blake, Renee Reading, RN Phone Number: 03/15/2019, 2:24 PM  Clinical Narrative: CHF. From home with daughter, Renee Blake. Receiving home health currently.   CM received call from daughter, Renee Blake. She feels patient will need SNF at time of DC. Discussed process with daughter. She verbalizes understanding. Her niece works at [employer], they would want patient to go there.   Await PT eval/recommendation.   In meantime, PASSR has been obtained and FL2 started.                  Expected Discharge Plan: Skilled Nursing Facility Barriers to Discharge: SNF Pending bed offer, Continued Medical Work up   Patient Goals and CMS Choice Patient states their goals for this hospitalization and ongoing recovery are:: daughter wishes for patient to go to rehab and then return home CMS Medicare.gov Compare Post Acute Care list provided to:: Other (Comment Required)(Dtr-Renee Blake) Choice offered to / list presented to : Adult Children  Expected Discharge Plan and Services Expected Discharge Plan: [place]   Discharge Planning Services: CM Consult Post Acute Care Choice: [place] Living arrangements for the past 2 months: Single Family Home                   Prior Living Arrangements/Services Living arrangements for the past 2 months: Single Family Home Lives with:: Adult Children          Need for Family Participation in Patient Care: Yes (Comment) Care giver support system in place?: Yes (comment) Current home services: Home RN, Home PT    Activities of Daily Living Home Assistive Devices/Equipment: Cane (specify quad or straight), Walker (specify type), Dentures (specify type) ADL Screening (condition at time of admission) Patient's cognitive ability adequate to  safely complete daily activities?: Yes Is the patient deaf or have difficulty hearing?: No Does the patient have difficulty seeing, even when wearing glasses/contacts?: No Does the patient have difficulty concentrating, remembering, or making decisions?: No Patient able to express need for assistance with ADLs?: Yes Does the patient have difficulty dressing or bathing?: No Independently performs ADLs?: Yes (appropriate for developmental age) Does the patient have difficulty walking or climbing stairs?: No Weakness of Legs: Both Weakness of Arms/Hands: None  Permission Sought/Granted Permission sought to share information with : Chartered certified accountant granted to share information with : Yes, Verbal Permission Granted         Admission diagnosis:  Hypoxemia [R09.02] Acute pulmonary edema (HCC) [J81.0] SOB (shortness of breath) [R06.02] Acute exacerbation of CHF (congestive heart failure) ([place]) [I50.9] CHF exacerbation ([place]) [I50.9] Hypoxia [R09.02] Patient Active Problem List   Diagnosis Date Noted  . Hypoxia 03/15/2019  . CHF exacerbation ([place]) 03/14/2019  . Bilateral pleural effusion 03/04/2019  . Chronic atrial fibrillation ([place]) 03/04/2019  . DNR (do not resuscitate) 03/04/2019  . Acute on chronic diastolic congestive heart failure ([place]) 03/02/2019  . Acute on chronic diastolic HF (heart failure) ([place]) 03/02/2019  . Chronic renal failure, stage 3b 03/02/2019  . Acute respiratory failure with hypoxia ([place]) 03/02/2019  . Acute exacerbation of CHF (congestive heart failure) ([place]) 01/27/2019  . Atrial fibrillation with RVR ([place]) 01/27/2019  . Iron deficiency anemia due to chronic blood loss---H/o Gi Bleed 01/27/2019  . Vaginal mass 07/29/2017  . Heme positive stool 07/25/2017  .  Cellulitis 12/10/2016  . Facial cellulitis   . Erroneous encounter - disregard 03/17/2015  . AKI (acute kidney injury) ([place])   . Metabolic acidosis 0000000  . Acute renal  failure syndrome ([place])   . Renal failure 08/22/2014  . Hyperkalemia 08/22/2014  . UTI (urinary tract infection) 08/22/2014  . Acute renal failure ([place]) 08/22/2014  . Dyspnea 03/15/2014  . Diabetes mellitus ([place]) 06/08/2013  . Acute diastolic congestive heart failure ([place]) 06/07/2013  . Syncope 03/26/2013  . Rectal bleeding 03/26/2013  . Dehydration 03/26/2013  . Generalized weakness 03/26/2013  . Malnutrition of moderate degree ([place]) 03/01/2013  . Diarrhea 02/28/2013  . Loss of weight 02/28/2013  . H/o Prior duodenal ulcer with hemorrhage 02/27/2013  . H/o Prior Lower GI bleed (anorectal Ca) 02/26/2013  . Acute blood loss anemia 02/26/2013  . UTI (lower urinary tract infection) 02/26/2013  . Hypertension 02/26/2013   PCP:  [name], MD Pharmacy:   [facility] [address] [place] [address] Phone: [phone] Fax: [phone]     Social Determinants of Health (SDOH) Interventions    Readmission Risk Interventions Readmission Risk Prevention Plan 03/15/2019 03/02/2019 02/06/2019  Transportation Screening Complete Complete Complete  PCP or Specialist Appt within 5-7 Days - - Complete  PCP or Specialist Appt within 3-5 Days - Complete -  Home Care Screening - - Complete  Medication Review (RN CM) - - Complete  HRI or Home Care Consult Complete Complete -  Social Work Consult for [place] Planning/Counseling Complete Complete -  Palliative Care Screening Not Applicable Complete -  Medication Review Press photographer) Complete Complete -  Some recent data might be hidden

## 2019-03-15 NOTE — Progress Notes (Signed)
  Nursing Home Choices :  [facility] [address] [place], [place] [address] [phone] Overall rating Much above average 2. 2.4 [facility] [address] [place], [place] [address] [phone] Overall rating Below average 3. 8.3 mi [place] [address] [place], [place] [address] [phone] Overall rating

## 2019-03-16 DIAGNOSIS — R112 Nausea with vomiting, unspecified: Secondary | ICD-10-CM

## 2019-03-16 DIAGNOSIS — K219 Gastro-esophageal reflux disease without esophagitis: Secondary | ICD-10-CM

## 2019-03-16 DIAGNOSIS — R64 Cachexia: Secondary | ICD-10-CM | POA: Diagnosis present

## 2019-03-16 DIAGNOSIS — I4819 Other persistent atrial fibrillation: Secondary | ICD-10-CM

## 2019-03-16 DIAGNOSIS — R630 Anorexia: Secondary | ICD-10-CM

## 2019-03-16 DIAGNOSIS — R5381 Other malaise: Secondary | ICD-10-CM | POA: Diagnosis present

## 2019-03-16 DIAGNOSIS — I4821 Permanent atrial fibrillation: Secondary | ICD-10-CM

## 2019-03-16 DIAGNOSIS — D649 Anemia, unspecified: Secondary | ICD-10-CM

## 2019-03-16 DIAGNOSIS — I5032 Chronic diastolic (congestive) heart failure: Secondary | ICD-10-CM

## 2019-03-16 DIAGNOSIS — R0602 Shortness of breath: Secondary | ICD-10-CM | POA: Diagnosis present

## 2019-03-16 LAB — BASIC METABOLIC PANEL
Anion gap: 9 (ref 5–15)
BUN: 28 mg/dL — ABNORMAL HIGH (ref 8–23)
CO2: 27 mmol/L (ref 22–32)
Calcium: 8.6 mg/dL — ABNORMAL LOW (ref 8.9–10.3)
Chloride: 102 mmol/L (ref 98–111)
Creatinine, Ser: 1.36 mg/dL — ABNORMAL HIGH (ref 0.44–1.00)
GFR calc Af Amer: 40 mL/min — ABNORMAL LOW (ref >60)
GFR calc non Af Amer: 35 mL/min — ABNORMAL LOW (ref >60)
Glucose, Bld: 83 mg/dL (ref 70–99)
Potassium: 3.7 mmol/L (ref 3.5–5.1)
Sodium: 138 mmol/L (ref 135–145)

## 2019-03-16 LAB — BRAIN NATRIURETIC PEPTIDE: B Natriuretic Peptide: 152 pg/mL — ABNORMAL HIGH (ref 0.0–100.0)

## 2019-03-16 LAB — CEA: CEA: 4.4 ng/mL (ref 0.0–4.7)

## 2019-03-16 LAB — OCCULT BLOOD X 1 CARD TO LAB, STOOL: Fecal Occult Bld: NEGATIVE

## 2019-03-16 NOTE — Evaluation (Signed)
Physical Therapy Evaluation Patient Details Name: Renee Blake MRN: [id number] DOB: 06/04/1928 Today's Date: 03/16/2019   History of Present Illness  (P) Renee Blake  is a 85 y.o. female, with history of chronic atrial fibrillation, not on anticoagulation due to prior history of GI bleed, chronic diastolic CHF, chronic kidney disease stage III, endometrial carcinoma status post prior radiation, followed by [facility] via [place] who was just discharged from hospital on 03/07/2019, after she was treated for acute on chronic diastolic CHF exacerbation today came back to hospital for intermittent episodes of shortness of breath with warm sensation throughout the body.  Patient says that she get these episodes where she would feel hot flash throughout the body, followed by nausea but no vomiting.  She is very vague about shortness of breath.  Today she became short of breath so EMS was called, she was placed on 2 L of nasal cannula by EMS her room air O2 sats were 88% at home.    Clinical Impression  Patient demonstrates slow labored movement for sitting up at bedside, unsteady on feet with frequent near loss of balance due to stumbling and has difficulty making turns using RW due to generalized weakness.  Patient on room air during ambulation with SpO2 dropping from 97% to 93% and at 94% when returning to bedside.  Patient tolerated sitting up in chair after therapy.  Patient will benefit from continued physical therapy in hospital and recommended venue below to increase strength, balance, endurance for safe ADLs and gait.     Follow Up Recommendations SNF    Equipment Recommendations  None recommended by PT    Recommendations for Other Services       Precautions / Restrictions Precautions Precautions: (P) Fall Restrictions Weight Bearing Restrictions: (P) No      Mobility  Bed Mobility Overal bed mobility: Needs Assistance Bed Mobility: Supine to Sit     Supine to sit: Supervision      General bed mobility comments: slow labored movement  Transfers Overall transfer level: Needs assistance Equipment used: Rolling walker (2 wheeled) Transfers: Sit to/from [facility] Sit to Stand: Min assist Stand pivot transfers: Min assist       General transfer comment: increased time, labored movement  Ambulation/Gait Ambulation/Gait assistance: Min assist;Mod assist Gait Distance (Feet): 25 Feet Assistive device: Rolling walker (2 wheeled) Gait Pattern/deviations: Decreased step length - right;Decreased step length - left;Decreased stride length Gait velocity: decreased   General Gait Details: slow labored cadence with frequent stumbling with near loss of balance, limited secondary to c/o fatigue  Stairs            Wheelchair Mobility    Modified Rankin (Stroke Patients Only)       Balance Overall balance assessment: Needs assistance Sitting-balance support: Feet supported;No upper extremity supported Sitting balance-Leahy Scale: Fair Sitting balance - Comments: fair/good seated at EOB   Standing balance support: Bilateral upper extremity supported;During functional activity Standing balance-Leahy Scale: Poor Standing balance comment: fair/poor using RW                             Pertinent Vitals/Pain Pain Assessment: (P) No/denies pain    Home Living Family/patient expects to be discharged to:: (P) Private residence Living Arrangements: (P) Children Available Help at Discharge: (P) Family;Friend(s);Available 24 hours/day Type of Home: (P) Apartment Home Access: (P) Stairs to enter Entrance Stairs-Rails: (P) None Entrance Stairs-Number of Steps: (P) 2 Home Layout: (P)  One level Home Equipment: (P) Walker - 2 wheels      Prior Function Level of Independence: (P) Needs assistance   Gait / Transfers Assistance Needed: (P) Household ambulator with RW  ADL's / Homemaking Assistance Needed: (P) assisted by family         Hand Dominance   Dominant Hand: (P) Right    Extremity/Trunk Assessment   Upper Extremity Assessment Upper Extremity Assessment: (P) Generalized weakness    Lower Extremity Assessment Lower Extremity Assessment: (P) Defer to PT evaluation    Cervical / Trunk Assessment Cervical / Trunk Assessment: (P) Normal  Communication   Communication: (P) No difficulties  Cognition Arousal/Alertness: (P) Awake/alert Behavior During Therapy: (P) WFL for tasks assessed/performed Overall Cognitive Status: (P) Within Functional Limits for tasks assessed                                        General Comments      Exercises     Assessment/Plan    PT Assessment Patient needs continued PT services  PT Problem List Decreased strength;Decreased activity tolerance;Decreased balance;Decreased mobility       PT Treatment Interventions Balance training;Gait training;Stair training;Functional mobility training;Therapeutic activities;Therapeutic exercise;Patient/family education    PT Goals (Current goals can be found in the Care Plan section)  Acute Rehab PT Goals Patient Stated Goal: return home after rehab PT Goal Formulation: With patient Time For Goal Achievement: 03/30/19 Potential to Achieve Goals: Good    Frequency Min 3X/week   Barriers to discharge        Co-evaluation PT/OT/SLP Co-Evaluation/Treatment: Yes Reason for Co-Treatment: (P) Complexity of the patient's impairments (multi-system involvement) PT goals addressed during session: Mobility/safety with mobility;Balance;Proper use of DME OT goals addressed during session: (P) ADL's and self-care       AM-PAC PT "6 Clicks" Mobility  Outcome Measure Help needed turning from your back to your side while in a flat bed without using bedrails?: None Help needed moving from lying on your back to sitting on the side of a flat bed without using bedrails?: A Little Help needed moving to and from a bed to  a chair (including a wheelchair)?: A Little Help needed standing up from a chair using your arms (e.g., wheelchair or bedside chair)?: A Little Help needed to walk in hospital room?: A Lot Help needed climbing 3-5 steps with a railing? : A Lot 6 Click Score: 17    End of Session   Activity Tolerance: Patient tolerated treatment well;Patient limited by fatigue Patient left: in chair;with call bell/phone within reach Nurse Communication: Mobility status PT Visit Diagnosis: Unsteadiness on feet (R26.81);Other abnormalities of gait and mobility (R26.89);Muscle weakness (generalized) (M62.81)    Time: [id number] PT Time Calculation (min) (ACUTE ONLY): 25 min   Charges:   PT Evaluation $PT Eval Moderate Complexity: 1 Mod PT Treatments $Gait Training: 8-22 mins        8:42 AM, 03/16/19 Lonell Grandchild, MPT Physical Therapist with [facility] [phone] office [phone] mobile phone

## 2019-03-16 NOTE — Evaluation (Signed)
Occupational Therapy Evaluation Patient Details Name: Renee Blake MRN: [id number] DOB: 1928/06/11 Today's Date: 03/16/2019    History of Present Illness Renee Blake  is a 85 y.o. female, with history of chronic atrial fibrillation, not on anticoagulation due to prior history of GI bleed, chronic diastolic CHF, chronic kidney disease stage III, endometrial carcinoma status post prior radiation, followed by [facility] via [place] who was just discharged from hospital on 03/07/2019, after she was treated for acute on chronic diastolic CHF exacerbation today came back to hospital for intermittent episodes of shortness of breath with warm sensation throughout the body.  Patient says that she get these episodes where she would feel hot flash throughout the body, followed by nausea but no vomiting.  She is very vague about shortness of breath.  Today she became short of breath so EMS was called, she was placed on 2 L of nasal cannula by EMS her room air O2 sats were 88% at home.   Clinical Impression   Pt agreeable to OT/PT co-evaluation this am. Pt with generalized weakness and poor activity tolerance this am, requiring increased time and assistance to complete ADLs and functional mobility tasks. Pt was able to tie gown in standing without LOB, using RW for functional mobility in room. Pt with limited ability to complete ADLs in standing due to poor activity tolerance. Recommend SNF on discharge to improve pt safety and independence in ADL completion, as well as increase strength required for ADL completion and improved activity tolerance.     Follow Up Recommendations  SNF    Equipment Recommendations  None recommended by OT       Precautions / Restrictions Precautions Precautions: Fall Restrictions Weight Bearing Restrictions: No      Mobility Bed Mobility Overal bed mobility: Needs Assistance Bed Mobility: Supine to Sit     Supine to sit: Supervision     General bed mobility comments:  slow labored movement  Transfers Overall transfer level: Needs assistance Equipment used: Rolling walker (2 wheeled) Transfers: Sit to/from [facility] Sit to Stand: Min assist Stand pivot transfers: Min assist       General transfer comment: increased time, labored movement        ADL either performed or assessed with clinical judgement   ADL Overall ADL's : Needs assistance/impaired Eating/Feeding: Modified independent;Sitting   Grooming: Wash/dry hands;Modified independent;Sitting               Lower Body Dressing: Modified independent;Sitting/lateral leans Lower Body Dressing Details (indicate cue type and reason): donning socks without difficulty seated at EOB Toilet Transfer: Min Fish farm manager Details (indicate cue type and reason): simulated with transfer to chair                 Vision Baseline Vision/History: Wears glasses Wears Glasses: At all times Patient Visual Report: No change from baseline Vision Assessment?: No apparent visual deficits            Pertinent Vitals/Pain Pain Assessment: No/denies pain     Hand Dominance Right   Extremity/Trunk Assessment Upper Extremity Assessment Upper Extremity Assessment: Generalized weakness(3-/5 throughout)   Lower Extremity Assessment Lower Extremity Assessment: Defer to PT evaluation   Cervical / Trunk Assessment Cervical / Trunk Assessment: Normal   Communication Communication Communication: No difficulties   Cognition Arousal/Alertness: Awake/alert Behavior During Therapy: WFL for tasks assessed/performed Overall Cognitive Status: Within Functional Limits for tasks assessed  Home Living Family/patient expects to be discharged to:: Private residence Living Arrangements: Children Available Help at Discharge: Family;Friend(s);Available 24 hours/day Type of Home: Apartment Home Access: Stairs to  enter Entrance Stairs-Number of Steps: 2 Entrance Stairs-Rails: None Home Layout: One level     Bathroom Shower/Tub: Teacher, early years/pre: Standard Bathroom Accessibility: Yes   Home Equipment: Environmental consultant - 2 wheels          Prior Functioning/Environment Level of Independence: Needs assistance  Gait / Transfers Assistance Needed: Household ambulator with RW ADL's / Homemaking Assistance Needed: assisted by family            OT Problem List: Decreased activity tolerance;Decreased strength;Decreased knowledge of use of DME or AE;Decreased safety awareness      OT Treatment/Interventions:      OT Goals(Current goals can be found in the care plan section) Acute Rehab OT Goals Patient Stated Goal: return home after rehab  OT Frequency:             Co-evaluation PT/OT/SLP Co-Evaluation/Treatment: Yes Reason for Co-Treatment: Complexity of the patient's impairments (multi-system involvement) PT goals addressed during session: Mobility/safety with mobility;Balance;Proper use of DME OT goals addressed during session: ADL's and self-care      AM-PAC OT "6 Clicks" Daily Activity     Outcome Measure Help from another person eating meals?: None Help from another person taking care of personal grooming?: A Little Help from another person toileting, which includes using toliet, bedpan, or urinal?: A Little Help from another person bathing (including washing, rinsing, drying)?: A Lot Help from another person to put on and taking off regular upper body clothing?: A Little Help from another person to put on and taking off regular lower body clothing?: A Little 6 Click Score: 18   End of Session Equipment Utilized During Treatment: Gait belt;Rolling walker  Activity Tolerance: Patient tolerated treatment well Patient left: in chair;with call bell/phone within reach  OT Visit Diagnosis: Muscle weakness (generalized) (M62.81)                Time: [id number] OT Time  Calculation (min): 17 min Charges:  OT General Charges $OT Visit: 1 Visit OT Evaluation $OT Eval Low Complexity: 1 Low   Guadelupe Sabin, OTR/L  [phone] 03/16/2019, 8:42 AM

## 2019-03-16 NOTE — Progress Notes (Signed)
PROGRESS NOTE    Patient: Renee Blake                            PCP: [name], MD                    DOB: 1928/12/13            DOA: 03/13/2019 [id number]             DOS: 03/16/2019, 9:35 AM   LOS: 2 days   Date of Service: The patient was seen and examined on 03/16/2019  Subjective:   The patient was seen and examined this morning, remained stable in bed. Complaining of mild shortness of breath with mild wheezing, currently on 3 L of oxygen, satting > 95%.   Daughter present at bedside.  Reporting, progressive weight loss in the past few weeks, poor appetite, weakness.  Patient reports the daughter confirms that every morning after she takes her medication she develops hot flushes, nausea proceeds with vomiting.  Reports patient has a history of rectal cancer.  Denies of having any frank bleeding rectally.   Brief Narrative:  Per HPI on 12/13 85 y.o. female, with history of chronic atrial fibrillation, not on anticoagulation due to prior history of GI bleed, chronic diastolic CHF, chronic kidney disease stage III, endometrial carcinoma status post prior radiation, followed by [facility] via [place] who was just discharged from hospital on 03/07/2019, after she was treated for acute on chronic diastolic CHF exacerbation today came back to hospital for intermittent episodes of shortness of breath with warm sensation throughout the body.  Patient says that she get these episodes where she would feel hot flash throughout the body, followed by nausea but no vomiting.  She is very vague about shortness of breath.  Today she became short of breath so EMS was called, she was placed on 2 L of nasal cannula by EMS her room air O2 sats were 88% at home. Patient denies fever or chills. Denies abdominal pain or dysuria Chest x-ray showed mild pulmonary edema, BNP 478 In the ED patient received Lasix 40 mg IV x1   03/15/2019: Remained lethargic, severe generalized weaknesses, poor appetite,  nausea but no vomiting.  Remains on 3 L of oxygen satting 100%. Daughter concerned due to history of cancer, progressive unintentional weight loss, getting weak, with multiple hospitalization. Cardiology consulted, GI consulted Patient meets the criteria for inpatient which was changed, PT/OT ordered-patient and family agreeable to rehab, SNF.  03/16/2019 Stable remained lethargic, with severe generalized weaknesses, was weaned off oxygen.,  Responded well to IV diuretic... Pending PT/OT evaluation today Patient/family hopeful for placement rehab, SNF   Procedures:   The patient was seen and examined this morning, remained stable, was successfully weaned off oxygen, and greater 92%. Responded well to IV diuretics. Denies any shortness of breath chest pain. Complain generalized weaknesses, poor appetite.  Nausea has improved, no vomiting overnight.   Assessment & Plan:   Active Problems:   Acute exacerbation of CHF (congestive heart failure) (HCC)   CHF exacerbation (HCC)   Hypoxia   Acute respiratory failure with hypoxia -likely due to exacerbation of diastolic congestive heart failure -Shortness of breath with exertion -Patient was successfully weaned off 3 L of oxygen to room air, currently satting greater than 95% -Status post 2 dose of IV Lasix 40 mg on the 13th and the 14th. -We will continue with p.o. diuretics -  Imaging including chest x-ray was reviewed by pulmonary edema noted, -Appreciate cardiology following and input   History of diastolic congestive heart failure/chronic A. Fib -Difficulty with maintaining fluid balance due to hypotension -Difficult with rate control -per cardiology, not chronically anticoagulated due to history of GI bleed -Monitoring I's and O's, BNP, -Duration of dose of IV Lasix 40 mg today... We will monitor her BUN/creatinine -CHADS2VASCr score 5-no chronic anticoagulation due to GI bleed, rectal carcinoma, duodenal ulcers, anemia -Rate  control medication Cardizem, amiodarone, Toprol -Cardiology consulted appreciate input, per recommendation medications timing has been switched we will continue to follow closely  Nausea/hot flash -Post ingestion of morning meds developing nausea, hot flashes, but no persistent vomiting -We will evaluate medication and divided the doses -As needed antiemetics -No signs of infection  Poor appetite, unintentional weight loss -History of questionable rectal cancer per daughter, history of endometrial cancer -History of GI bleed,>> H&H remained stable, will repeat Hemoccult -CEA normal at 4.4, -GI consulted, appreciate input  -Possible developing intolerance to medication as they are taken in a.m., including her heart meds We will try to divide the medications accordingly to twice daily   History of anorectal cancer/endometrial carcinoma/ -Pending Hemoccult -Stable -CEA normal at 4.4 -GI consulted -patient followed by [facility] oncology, GYN and medical oncology team from [facility] by [facility]. No recent follow-up  Severe debility -PT/OT will be consulted for evaluation recommendation -The patient and daughter are open to placement for rehab, SNF   Nutritional status:  Nutrition Problem: Increased nutrient needs Etiology: acute illness, chronic illness(HF exacerbation) DietitianDietitian will be consulted Signs/Symptoms: estimated needs Interventions: Education, Snacks DVT prophylaxis: SCD/Compression stockings Code Status:   Code Status: DNR Family Communication:     The above findings and plan of care has been discussed with patient and family in detail,  they expressed understanding and agreement of above. Disposition Plan:  >3 days Admission status:  NPATIEN -meets the inpatient criteria due hypoxia, diastolic congestive heart failure exacerbation, cachexia, severe debility, atrial fibrillation  Consultants: None  Procedures:   No admission procedures for hospital  encounter.  Antimicrobials:  Anti-infectives (From admission, onward)   None       Medication:  . amiodarone  200 mg Oral QPM  . diltiazem  180 mg Oral Daily  . enoxaparin (LOVENOX) injection  30 mg Subcutaneous Q24H  . feeding supplement  1 Container Oral TID BM  . furosemide  40 mg Oral Daily  . loratadine  10 mg Oral Daily  . metoprolol succinate  50 mg Oral QPM  . multivitamin with minerals  1 tablet Oral Daily  . pantoprazole  40 mg Oral QPM  . sodium chloride flush  3 mL Intravenous Q12H  . sodium polystyrene  15 g Oral Once    sodium chloride, acetaminophen, ALPRAZolam, dicyclomine, meclizine, ondansetron **OR** ondansetron (ZOFRAN) IV, sodium chloride flush   Objective:   Vitals:   03/15/19 1409 03/15/19 2024 03/15/19 2038 03/16/19 0517  BP: (!) 104/59  110/65 124/76  Pulse: 88  (!) 107 93  Resp: 16  18 20   Temp: 98.6 F (37 C)  98.6 F (37 C) 97.9 F (36.6 C)  TempSrc: Oral  Oral Oral  SpO2: 96% 96% 97% 99%  Weight:      Height:       No intake or output data in the 24 hours ending 03/16/19 0935 Filed Weights   03/13/19 1906 03/14/19 2017  Weight: 49.4 kg 50.3 kg     Examination:  Physical Exam  Constitution: Cachectic elderly female alert, cooperative, no distress,  Psychiatric: Normal and stable mood and affect, cognition intact,   HEENT: Normocephalic, PERRL, otherwise with in Normal limits  Chest:Chest symmetric Cardio vascular:  S1/S2, RRR, No murmure, No Rubs or Gallops  pulmonary: Clear to auscultation bilaterally, respirations unlabored, negative wheezes / crackles Abdomen: Soft, non-tender, non-distended, bowel sounds,no masses, no organomegaly Muscular skeletal:  Severe generalized weaknesses with cachexia noted Limited exam - in bed, able to move all 4 extremities, Normal strength,  Neuro: CNII-XII intact. , normal motor and sensation, reflexes intact  Extremities: No pitting edema lower extremities, +2 pulses  Skin: Dry, warm to  touch, negative for any Rashes, No open wounds Wounds: per nursing documentation       LABs:  CBC Latest Ref Rng & Units 03/14/2019 03/13/2019 03/07/2019  WBC 4.0 - 10.5 K/uL 7.3 9.2 5.1  Hemoglobin 12.0 - 15.0 g/dL 9.8(L) 9.9(L) 9.4(L)  Hematocrit 36.0 - 46.0 % 32.1(L) 33.1(L) 31.0(L)  Platelets 150 - 400 K/uL 326 328 281   CMP Latest Ref Rng & Units 03/16/2019 03/14/2019 03/13/2019  Glucose 70 - 99 mg/dL 83 100(H) 118(H)  BUN 8 - 23 mg/dL 28(H) 30(H) 36(H)  Creatinine 0.44 - 1.00 mg/dL 1.36(H) 1.48(H) 1.77(H)  Sodium 135 - 145 mmol/L 138 141 140  Potassium 3.5 - 5.1 mmol/L 3.7 4.1 5.6(H)  Chloride 98 - 111 mmol/L 102 104 105  CO2 22 - 32 mmol/L 27 24 26   Calcium 8.9 - 10.3 mg/dL 8.6(L) 9.2 9.1  Total Protein 6.5 - 8.1 g/dL - 7.3 -  Total Bilirubin 0.3 - 1.2 mg/dL - 0.5 -  Alkaline Phos 38 - 126 U/L - 64 -  AST 15 - 41 U/L - 13(L) -  ALT 0 - 44 U/L - 13 -        SIGNED: Deatra James, MD, FACP, FHM. [facility],  Pager [phone]  If 7PM-7AM, please contact night-coverage [email or web address] 03/16/2019, 9:35 AM

## 2019-03-16 NOTE — Plan of Care (Signed)
  Problem: Acute Rehab PT Goals(only PT should resolve) Goal: Pt Will Go Supine/Side To Sit Outcome: Progressing Flowsheets (Taken 03/16/2019 0843) Pt will go Supine/Side to Sit: with modified independence Goal: Patient Will Transfer Sit To/From Stand Outcome: Progressing Flowsheets (Taken 03/16/2019 [phone]) Patient will transfer sit to/from stand:  with min guard assist  with minimal assist Goal: Pt Will Transfer Bed To Chair/Chair To Bed Outcome: Progressing Flowsheets (Taken 03/16/2019 0843) Pt will Transfer Bed to Chair/Chair to Bed:  min guard assist  with min assist Goal: Pt Will Ambulate Outcome: Progressing Flowsheets (Taken 03/16/2019 0843) Pt will Ambulate:  50 feet  with minimal assist  with rolling walker   8:44 AM, 03/16/19 Lonell Grandchild, MPT Physical Therapist with [facility] [phone] office [phone] mobile phone

## 2019-03-16 NOTE — TOC Progression Note (Addendum)
Transition of Care [facility]) - Progression Note    Patient Details  Name: Renee Blake MRN: [id number] Date of Birth: 1928/04/27  [facility] CM/SW Contact  Viana Sleep, Chauncey Reading, RN Phone Number: 03/16/2019, 1:19 PM  Clinical Narrative:   Patient has insurance authorization, reference # [id number], good for 3 days, starting 12/15. Patient has a bed at [facility]. Will need a covid result within 48 hours of DC to facility, attending notified. [name], daughter, updated.     Expected Discharge Plan: [place] Barriers to Discharge: SNF Pending bed offer, Continued Medical Work up  Expected Discharge Plan and Services Expected Discharge Plan: [place]   Discharge Planning Services: CM Consult Post Acute Care Choice: [place] Living arrangements for the past 2 months: Single Family Home                       Social Determinants of Health (SDOH) Interventions    Readmission Risk Interventions Readmission Risk Prevention Plan 03/15/2019 03/02/2019 02/06/2019  Transportation Screening Complete Complete Complete  PCP or Specialist Appt within 5-7 Days - - Complete  PCP or Specialist Appt within 3-5 Days - Complete -  Home Care Screening - - Complete  Medication Review (RN CM) - - Complete  HRI or Home Care Consult Complete Complete -  Social Work Consult for [place] Planning/Counseling Complete Complete -  Palliative Care Screening Not Applicable Complete -  Medication Review Press photographer) Complete Complete -  Some recent data might be hidden

## 2019-03-16 NOTE — Consult Note (Signed)
GI Inpatient Consult Note  Reason for Consult: nausea, weight loss    Attending Requesting Consult:  History of Present Illness: Renee Blake is a 85 y.o. female seen for evaluation of n/v at the request of Dr. Roger Shelter.   Past medical history of chronic atrial fib (not on anticoagulation given history of GI bleed-2014 duodenal ulcer), chronic diastolic CHF, CKD stage III. Hx of Stage IVA vaginal versus anorectal cancer dx 06/2017, she completed radiation June 2019 with last PET in October 2019.  Last seen by [facility] oncology March 2020. Was not able to be seen for planned 6 month f/up 2/2 COVID.    Recent admissions reviewed including 03/02/19 (atrial fib w/ RVR), 03/04/19-03/07/19 (SOB-pleural effusions, atelectasis, volume overload), during that admission had left-sided thoracentesis, IV Lasix. Readmitted on 03/13/19 for SOB.    Pt reports approximately 10 days ago developing episodes of nausea/hot flashes/SOB. She denies any vomiting w/ episodes. Episodes occur more in afternoon & evening but do not specifically relate to meals. Reports severe episodes, occurring most days now. She reports a poor appetite and only eats crackers following/during episodes but a good appetite in morning and eats good breakfast daily. Never wakes up w/ symptoms. Average episode lasts 30 minutes. She denies any dysphagia. On protonix 40mg  daily for GERD without breakthrough symptoms. Feels weight is overall stable (has varied below due to fluid retention/diuretics), daughter denies concerning weight loss and only has poor appetite during/after episodes. Has remote hx of gastric ulcers and denies any nsaid use currently.   Has BM about every other day which is her baseline. Denies any rectal bleeding. No abd pain w/ episodes. No change in bowel habits w/ above nausea episodes.   Daughter notes episodes began shortly after amiodarone dose was increased during 03/02/19 admission. Takes amiodarone in AM w/ food. Per  chart review was discharged 02/06/19 with amiodarone 200mg  BID and decreased to 200mg  once daily on 02/18/19 outpatient f/up.   Lives at home w/ daughter.    Last Colonoscopy: April 2019-new findings include nodular hard mass anterior anal orifice, DRE revealed 5 cm hard rectal mass circumferential anterior bowel wall, 6 mm polyp distal sigmoid. Path w/ rectum distal lesion biopsy-at least high-grade squamous intraepithelial lesion suspicious for invasion, sigmoid polyp inflammatory   Vaginal biopsy 07/2017 with moderately differentiated squamous cell carcinoma, overlying high-grade squamous intraepithelial lesion  Last EGD - 2014 - duodenal ulcer w/ oozing, duodenal bulb stenosis, s/p epi/cautery, mild stricture GEJ, small HH    Past Medical History:  Past Medical History:  Diagnosis Date  . Acute blood loss anemia   . Atrial fibrillation ([place])    a. diagnosed in 12/2018. Rate-control pursued given not a candidate for anticoagulation  . Chronic diarrhea   . Dilation of biliary tract   . Duodenal stricture   . Esophageal stricture   . Gastric ulcer   . GI bleed   . Hyperlipemia   . Hypertension   . Microscopic colitis   . Nephrolithiasis   . Pancreatic duct dilated   . Schatzki's ring   . Skin cancer   . Sliding hiatal hernia   . Vertigo     Problem List: Patient Active Problem List   Diagnosis Date Noted  . Debility 03/16/2019  . Shortness of breath 03/16/2019  . Cachexia ([place]) 03/16/2019  . Permanent atrial fibrillation ([place])   . Hypoxia 03/15/2019  . CHF exacerbation ([place]) 03/14/2019  . Bilateral pleural effusion 03/04/2019  . Chronic atrial fibrillation ([place]) 03/04/2019  .  DNR (do not resuscitate) 03/04/2019  . Acute on chronic diastolic congestive heart failure ([place]) 03/02/2019  . Chronic diastolic heart failure ([place]) 03/02/2019  . Chronic renal failure, stage 3b 03/02/2019  . Acute respiratory failure with hypoxia ([place]) 03/02/2019  . Acute exacerbation of CHF  (congestive heart failure) ([place]) 01/27/2019  . Atrial fibrillation with RVR ([place]) 01/27/2019  . Iron deficiency anemia due to chronic blood loss---H/o Gi Bleed 01/27/2019  . Vaginal mass 07/29/2017  . Heme positive stool 07/25/2017  . Cellulitis 12/10/2016  . Facial cellulitis   . Erroneous encounter - disregard 03/17/2015  . AKI (acute kidney injury) ([place])   . Metabolic acidosis 0000000  . Acute renal failure syndrome ([place])   . Renal failure 08/22/2014  . Hyperkalemia 08/22/2014  . UTI (urinary tract infection) 08/22/2014  . Acute renal failure ([place]) 08/22/2014  . Dyspnea 03/15/2014  . Diabetes mellitus ([place]) 06/08/2013  . Acute diastolic congestive heart failure ([place]) 06/07/2013  . Syncope 03/26/2013  . Rectal bleeding 03/26/2013  . Dehydration 03/26/2013  . Generalized weakness 03/26/2013  . Malnutrition of moderate degree ([place]) 03/01/2013  . Diarrhea 02/28/2013  . Loss of weight 02/28/2013  . H/o Prior duodenal ulcer with hemorrhage 02/27/2013  . H/o Prior Lower GI bleed (anorectal Ca) 02/26/2013  . Acute blood loss anemia 02/26/2013  . UTI (lower urinary tract infection) 02/26/2013  . Hypertension 02/26/2013    Past Surgical History: Past Surgical History:  Procedure Laterality Date  . BIOPSY  07/28/2017   Procedure: BIOPSY;  Surgeon: Rogene Houston, MD;  [facility];  Service: Endoscopy;;  rectum  . COLONOSCOPY N/A 07/28/2017   Procedure: COLONOSCOPY;  Surgeon: Rogene Houston, MD;  Location: [facility] END[facility];  Service: Endoscopy;  Laterality: N/A;  . ESOPHAGOGASTRODUODENOSCOPY N/A 02/27/2013   Procedure: ESOPHAGOGASTRODUODENOSCOPY (EGD);  Surgeon: Ladene Artist, MD;  Location: [facility] ENDOSCOPY;  Service: Endoscopy;  Laterality: N/A;  . ESOPHAGOGASTRODUODENOSCOPY N/A 03/28/2013   Procedure: ESOPHAGOGASTRODUODENOSCOPY (EGD);  Surgeon: Rogene Houston, MD;  Location: [facility];  Service: Endoscopy;  Laterality: N/A;  . HOT HEMOSTASIS N/A 02/27/2013    Procedure: HOT HEMOSTASIS (ARGON PLASMA COAGULATION/BICAP);  Surgeon: Ladene Artist, MD;  Location: [facility] ENDOSCOPY;  Service: Endoscopy;  Laterality: N/A;  . NOSE SURGERY     for skin cancer  . POLYPECTOMY  07/28/2017   Procedure: POLYPECTOMY;  Surgeon: Rogene Houston, MD;  Location: [facility];  Service: Endoscopy;;  colon     Allergies: Allergies  Allergen Reactions  . Clonazepam Other (See Comments)    Pruritis  . Codeine Nausea And Vomiting and Other (See Comments)    Reaction unknown  . Sulfa Antibiotics Other (See Comments)    Reactions unknown    Home Medications: Medications Prior to Admission  Medication Sig Dispense Refill Last Dose  . acetaminophen (TYLENOL) 325 MG tablet Take 2 tablets (650 mg total) by mouth every 6 (six) hours as needed for mild pain, fever or headache (or Fever >/= 101). 12 tablet 0 Past Week at Unknown time  . ALPRAZolam (XANAX) 0.25 MG tablet Take 1 tablet (0.25 mg total) by mouth 2 (two) times daily as needed. for anxiety 15 tablet 0 Past Week at Unknown time  . amiodarone (PACERONE) 200 MG tablet Take 1 tablet (200 mg total) by mouth daily. 90 tablet 3 03/13/2019 at 0800  . dicyclomine (BENTYL) 10 MG capsule Take 10 mg by mouth every 4 (four) hours as needed for spasms.   03/12/2019 at  Unknown time  . diltiazem (CARDIZEM CD) 180 MG 24 hr capsule Take 1 capsule (180 mg total) by mouth daily. 90 capsule 3 03/13/2019 at Unknown time  . furosemide (LASIX) 40 MG tablet Take 1 tablet (40 mg total) by mouth daily. May take additional dose if shortness of breath or fluid buildup 30 tablet 1 03/13/2019 at Unknown time  . levocetirizine (XYZAL) 5 MG tablet Take 5 mg by mouth every evening.   Past Week at Unknown time  . meclizine (ANTIVERT) 25 MG tablet Take 25 mg by mouth daily as needed for dizziness.    03/13/2019 at Unknown time  . metoprolol succinate (TOPROL-XL) 25 MG 24 hr tablet Take 2 tablets (50 mg total) by mouth daily. 60 tablet 3 03/13/2019 at  0800  . ondansetron (ZOFRAN) 4 MG tablet Take 4 mg by mouth every 8 (eight) hours as needed for nausea or vomiting.   03/12/2019 at Unknown time  . pantoprazole (PROTONIX) 40 MG tablet Take 1 tablet (40 mg total) by mouth daily. 30 tablet 4 03/13/2019 at Unknown time   Home medication reconciliation was completed with the patient.   Scheduled Inpatient Medications:   . amiodarone  200 mg Oral QPM  . diltiazem  180 mg Oral Daily  . enoxaparin (LOVENOX) injection  30 mg Subcutaneous Q24H  . feeding supplement  1 Container Oral TID BM  . furosemide  40 mg Oral Daily  . loratadine  10 mg Oral Daily  . metoprolol succinate  50 mg Oral QPM  . multivitamin with minerals  1 tablet Oral Daily  . pantoprazole  40 mg Oral QPM  . sodium chloride flush  3 mL Intravenous Q12H    Continuous Inpatient Infusions:   . sodium chloride      PRN Inpatient Medications:  sodium chloride, acetaminophen, ALPRAZolam, dicyclomine, meclizine, ondansetron **OR** ondansetron (ZOFRAN) IV, sodium chloride flush  Family History: family history includes Heart attack in her father.     Social History:   reports that she has never smoked. She has never used smokeless tobacco. She reports that she does not drink alcohol or use drugs.    Review of Systems: Constitutional: Weight is stable overall .Marland Kitchen  Eyes: No changes in vision. ENT: No oral lesions, sore throat.  GI: see HPI.  Heme/Lymph: No easy bruising.  CV: No chest pain.  GU: No hematuria.  Integumentary: No rashes.  Neuro: No headaches.  Psych: No depression/anxiety.  Endocrine: No heat/cold intolerance.  Allergic/Immunologic: No urticaria.  Resp: No cough, SOB.  Musculoskeletal: No joint swelling.    Physical Examination: BP 124/76 (BP Location: Left Arm)   Pulse 93   Temp 97.9 F (36.6 C) (Oral)   Resp 20   Ht 5\' 4"  (1.626 m)   Wt 50.3 kg   SpO2 99%   BMI 19.03 kg/m  Gen: NAD, alert and oriented x 4. Sitting in chair. Daughter at  bedside.  HEENT: PEERLA, EOMI, Neck: supple, no JVD or thyromegaly Chest: CTA bilaterally, no wheezes, crackles, or other adventitious sounds CV: RRR, no m/g/c/r Abd: soft, NT, ND, +BS in all four quadrants; no HSM, guarding, ridigity, or rebound tenderness Ext: no edema, well perfused with 2+ pulses, Skin: no rash or lesions noted Lymph: no LAD  Data: Lab Results  Component Value Date   WBC 7.3 03/14/2019   HGB 9.8 (L) 03/14/2019   HCT 32.1 (L) 03/14/2019   MCV 93.6 03/14/2019   PLT 326 03/14/2019   Recent Labs  Lab 03/13/19  2137 03/14/19 0449  HGB 9.9* 9.8*   Lab Results  Component Value Date   NA 138 03/16/2019   K 3.7 03/16/2019   CL 102 03/16/2019   CO2 27 03/16/2019   BUN 28 (H) 03/16/2019   CREATININE 1.36 (H) 03/16/2019   Lab Results  Component Value Date   ALT 13 03/14/2019   AST 13 (L) 03/14/2019   ALKPHOS 64 03/14/2019   BILITOT 0.5 03/14/2019   TSH elevated (8.065) but follow up free T4 normal  CEA normal yesterday  IMPRESSION: CXR 03/13/19  Cardiomegaly with persistent increased interstitial markings, which could reflect a component of edema. No new focal airspace consolidation.  Assessment/Plan:  Episodic nausea/hot flashes/poor appetite  -Plan for CT a/p for initial evaluation -Agree that may be symptoms may be affected by medications as well: Cardizem, amiodarone, and Toprol now by divided up throughout day-will follow up to assess if this changes frequency/severity of episodes.  -Specific wt loss amount somewhat difficult to assess given CHF exacerbations/diuretic use, hopefully appetite will improve w/ above   Chronic anemia - normocytic at chronic baseline around 9-10, no obvious GI bleeding   GERD - well controlled on protonix, continue PPI. No dysphagia.      Further recommendations pending review of imaging.   Daughter Marlowe Kays [phone]  Case was discussed with Dr. Laural Golden. Thank you for the consult. Please call with  questions or concerns.  Laurine Blazer, PA-C [facility]

## 2019-03-16 NOTE — Progress Notes (Signed)
Progress Note  Patient Name: Renee Blake Date of Encounter: 03/16/2019  Primary Cardiologist: Kate Sable, MD  Subjective   Sitting in bedside chair.  States that she did eat something for breakfast this morning.  No chest pain or palpitations.  Overall weak.  Inpatient Medications    Scheduled Meds: . amiodarone  200 mg Oral QPM  . diltiazem  180 mg Oral Daily  . enoxaparin (LOVENOX) injection  30 mg Subcutaneous Q24H  . feeding supplement  1 Container Oral TID BM  . furosemide  40 mg Oral Daily  . loratadine  10 mg Oral Daily  . metoprolol succinate  50 mg Oral QPM  . multivitamin with minerals  1 tablet Oral Daily  . pantoprazole  40 mg Oral QPM  . sodium chloride flush  3 mL Intravenous Q12H  . sodium polystyrene  15 g Oral Once   Continuous Infusions: . sodium chloride     PRN Meds: sodium chloride, acetaminophen, ALPRAZolam, dicyclomine, meclizine, ondansetron **OR** ondansetron (ZOFRAN) IV, sodium chloride flush   Vital Signs    Vitals:   03/15/19 1409 03/15/19 2024 03/15/19 2038 03/16/19 0517  BP: (!) 104/59  110/65 124/76  Pulse: 88  (!) 107 93  Resp: 16  18 20   Temp: 98.6 F (37 C)  98.6 F (37 C) 97.9 F (36.6 C)  TempSrc: Oral  Oral Oral  SpO2: 96% 96% 97% 99%  Weight:      Height:       No intake or output data in the 24 hours ending 03/16/19 0958 Filed Weights   03/13/19 1906 03/14/19 2017  Weight: 49.4 kg 50.3 kg    Telemetry    Atrial fibrillation.  Personally reviewed.  Physical Exam   GEN:  Frail-appearing elderly woman, no acute distress.   Neck: No JVD. Cardiac:  Irregularly irregular, soft systolic murmur, no gallop.  Respiratory:  Decreased breath sounds without rales.Marland Kitchen GI: Soft, nontender, bowel sounds present. MS: No edema; No deformity. Neuro:  Nonfocal. Psych: Alert and oriented x 3. Normal affect.  Labs    Chemistry Recent Labs  Lab 03/13/19 2137 03/14/19 0449 03/16/19 0421  NA 140 141 138  K 5.6*  4.1 3.7  CL 105 104 102  CO2 26 24 27   GLUCOSE 118* 100* 83  BUN 36* 30* 28*  CREATININE 1.77* 1.48* 1.36*  CALCIUM 9.1 9.2 8.6*  PROT  --  7.3  --   ALBUMIN  --  3.5  --   AST  --  13*  --   ALT  --  13  --   ALKPHOS  --  64  --   BILITOT  --  0.5  --   GFRNONAA 25* 31* 35*  GFRAA 29* 36* 40*  ANIONGAP 9 13 9      Hematology Recent Labs  Lab 03/13/19 2137 03/14/19 0449  WBC 9.2 7.3  RBC 3.49* 3.43*  HGB 9.9* 9.8*  HCT 33.1* 32.1*  MCV 94.8 93.6  MCH 28.4 28.6  MCHC 29.9* 30.5  RDW 14.7 14.6  PLT 328 326    Cardiac Enzymes Recent Labs  Lab 03/02/19 0455 03/04/19 0417 03/04/19 0552 03/13/19 2137 03/14/19 0010  TROPONINIHS 5 5 5 5 5     BNP Recent Labs  Lab 03/13/19 2137 03/16/19 0421  BNP 478.0* 152.0*     Radiology    No results found.  Cardiac Studies   Echocardiogram 01/28/2019: 1. Left ventricular ejection fraction, by visual estimation, is 60 to 65%.  The left ventricle has normal function. There is no left ventricular hypertrophy. 2. Elevated left atrial pressure. 3. Left ventricular diastolic parameters are consistent with Grade II diastolic dysfunction (pseudonormalization). 4. Global right ventricle has normal systolic function.The right ventricular size is normal. No increase in right ventricular wall thickness. 5. Left atrial size was severely dilated. 6. Right atrial size was mildly dilated. 7. The mitral valve is abnormal. Moderate mitral valve regurgitation. No evidence of mitral stenosis. 8. The MR vena contracta is 0.6 cm. The MV/AV TVI ratio is 1.1. Findings support moderate mitral regurgitation. 9. The tricuspid valve is normal in structure. Tricuspid valve regurgitation is mild. 10. The aortic valve is tricuspid. Aortic valve regurgitation is not visualized. No evidence of aortic valve sclerosis or stenosis. 11. The pulmonic valve was not well visualized. Pulmonic valve regurgitation is mild. 12. Moderately elevated  pulmonary artery systolic pressure. 13. The inferior vena cava is normal in size with greater than 50% respiratory variability, suggesting right atrial pressure of 3 mmHg.  Patient Profile     85 y.o. female with a history of chronic diastolic heart failure, hypertension, GI bleed with chronic anemia, anal rectal cancer status post radiation, and persistent/permanent atrial fibrillation.  Assessment & Plan    1.  Persistent/permanent atrial fibrillation.  Plan to manage with heart rate control strategy as tolerated.  Medications are being divided up with Cardizem CD in the morning followed by Toprol-XL and amiodarone in the evening, hopefully to improve suspected intolerances related to receiving them all at one time.  2.  Chronic diastolic heart failure.  Continuing home dose of oral Lasix at this point.  Recent echocardiogram noted above.  3.  Chronic anemia with history of GI bleed.  Hemoglobin is stable.  She is not anticoagulated.  4.  Deconditioning.  Plan for PT evaluation as per primary team and potentially rehabilitation if a candidate.  Today she is receiving Cardizem CD dose in the morning and Toprol-XL along with amiodarone in the evening.  Remains on stable oral Lasix regimen.  If spells/nausea/anorexia do not improve, may need to try and take her off amiodarone although heart rate control has been somewhat challenging.  Signed, Rozann Lesches, MD  03/16/2019, 9:58 AM

## 2019-03-17 ENCOUNTER — Ambulatory Visit: Payer: Medicare Other | Admitting: Student

## 2019-03-17 ENCOUNTER — Inpatient Hospital Stay (HOSPITAL_COMMUNITY): Payer: Medicare Other

## 2019-03-17 DIAGNOSIS — N1832 Chronic kidney disease, stage 3b: Secondary | ICD-10-CM

## 2019-03-17 DIAGNOSIS — I10 Essential (primary) hypertension: Secondary | ICD-10-CM

## 2019-03-17 DIAGNOSIS — J9601 Acute respiratory failure with hypoxia: Secondary | ICD-10-CM

## 2019-03-17 LAB — BRAIN NATRIURETIC PEPTIDE: B Natriuretic Peptide: 134 pg/mL — ABNORMAL HIGH (ref 0.0–100.0)

## 2019-03-17 LAB — BASIC METABOLIC PANEL WITH GFR
Anion gap: 12 (ref 5–15)
BUN: 24 mg/dL — ABNORMAL HIGH (ref 8–23)
CO2: 25 mmol/L (ref 22–32)
Calcium: 8.9 mg/dL (ref 8.9–10.3)
Chloride: 102 mmol/L (ref 98–111)
Creatinine, Ser: 1 mg/dL (ref 0.44–1.00)
GFR calc Af Amer: 58 mL/min — ABNORMAL LOW
GFR calc non Af Amer: 50 mL/min — ABNORMAL LOW
Glucose, Bld: 94 mg/dL (ref 70–99)
Potassium: 3.1 mmol/L — ABNORMAL LOW (ref 3.5–5.1)
Sodium: 139 mmol/L (ref 135–145)

## 2019-03-17 LAB — SARS CORONAVIRUS 2 (TAT 6-24 HRS): SARS Coronavirus 2: NEGATIVE

## 2019-03-17 IMAGING — CT CT ABD-PELV W/O CM
2 of 4 series · 16 of 46 positions shown, 18 images · non-contrast
Comparison: [DATE]

CLINICAL DATA: Loss of weight. History of squamous cell carcinoma
of the anal canal and vagina. There is also a history of endometrial
carcinoma.

EXAM:
CT ABDOMEN AND PELVIS WITHOUT CONTRAST
TECHNIQUE: Multidetector CT imaging of the abdomen and pelvis was performed
following the standard protocol without IV contrast.

[Series 2: axial st · axial · 0.69mm/px · z∈[+1086,+1446]mm · 13 of 82 slices shown, 15 images]
[im 5/82  soft-tissue]
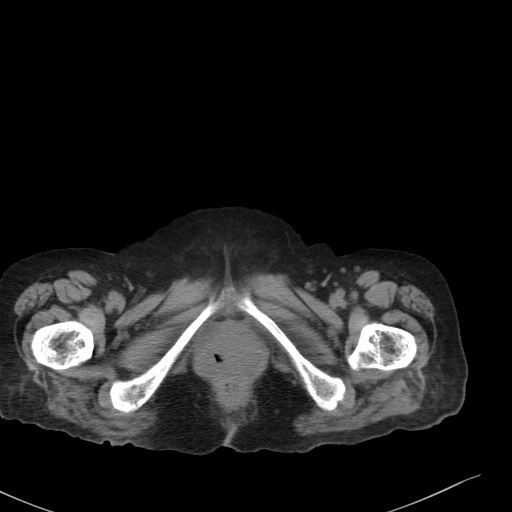
[im 5/82  bone]
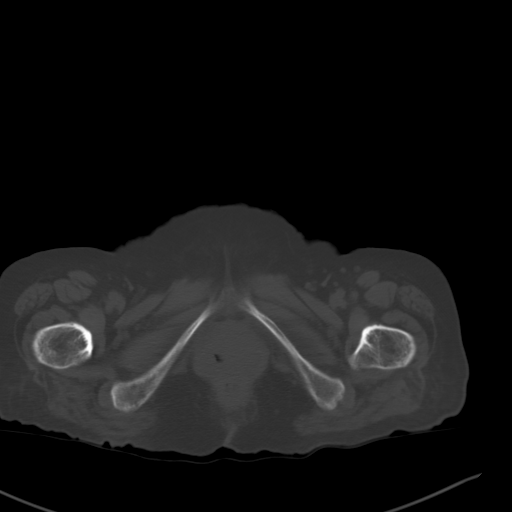
[im 10/82  soft-tissue]
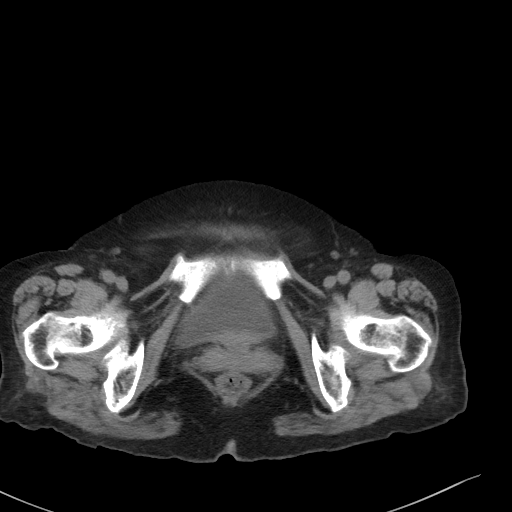
[im 20/82  soft-tissue]
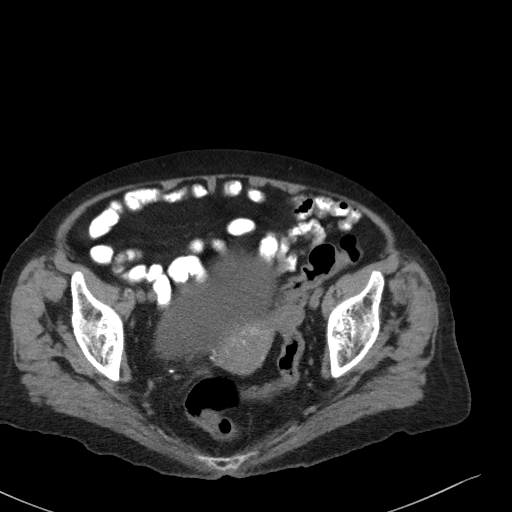
[im 24/82  soft-tissue]
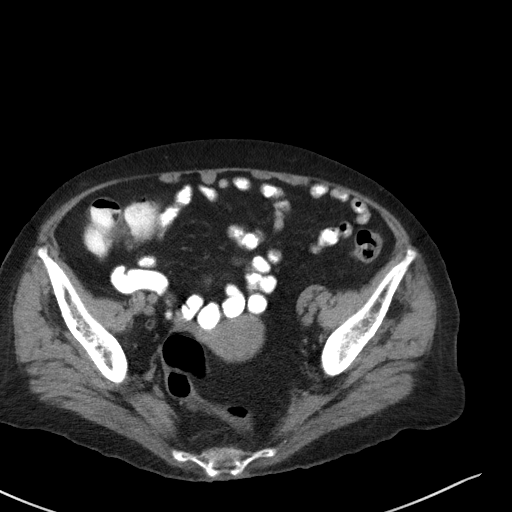
[im 29/82  soft-tissue]
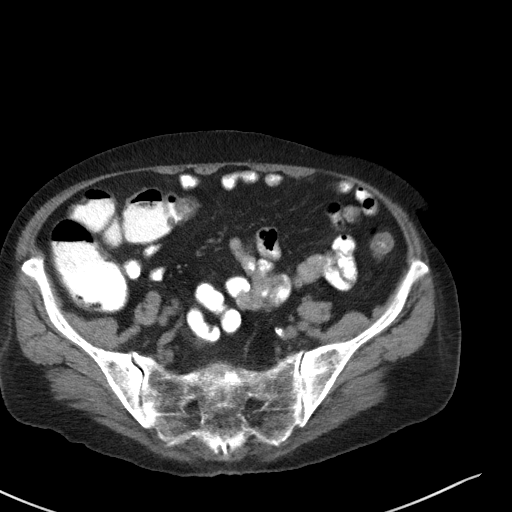
[im 34/82  soft-tissue]
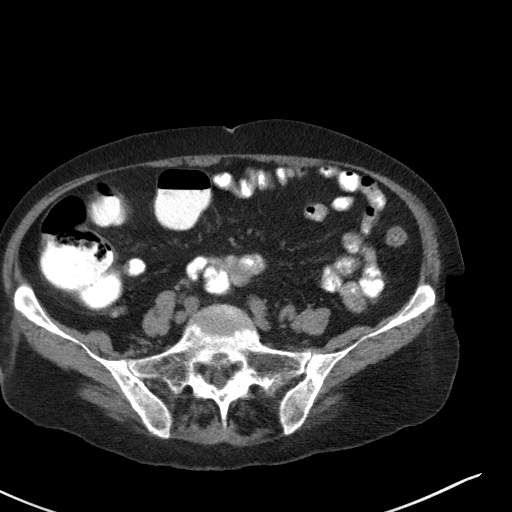
[im 43/82  soft-tissue]
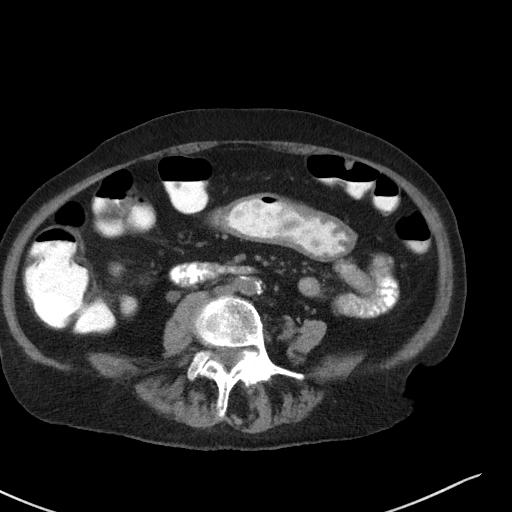
[im 48/82  soft-tissue]
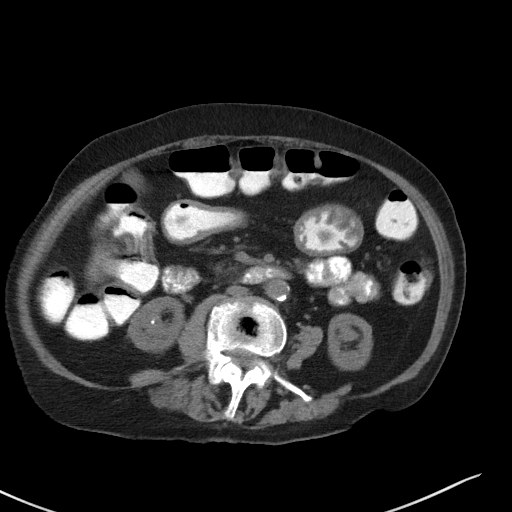
[im 53/82  soft-tissue]
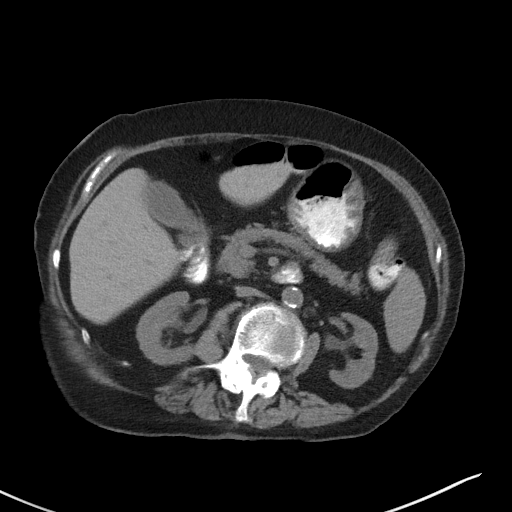
[im 53/82  bone]
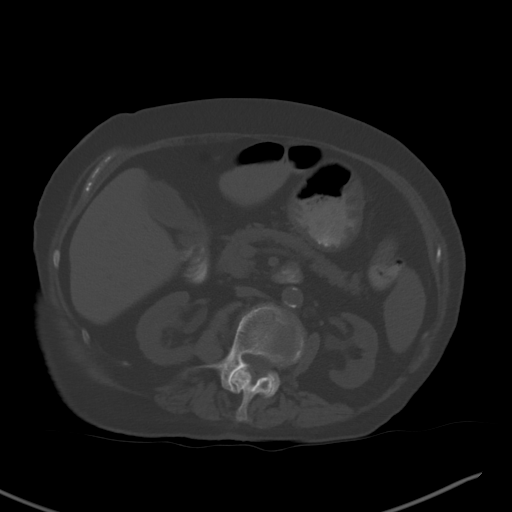
[im 58/82  soft-tissue]
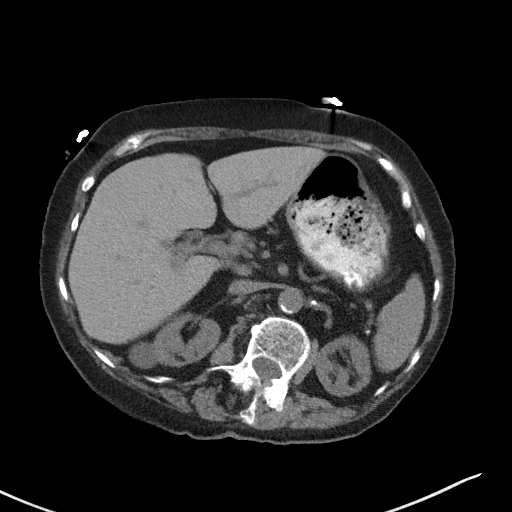
[im 62/82  soft-tissue]
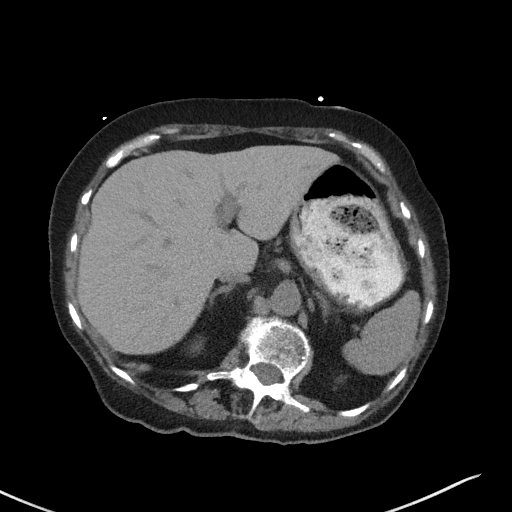
[im 72/82  soft-tissue]
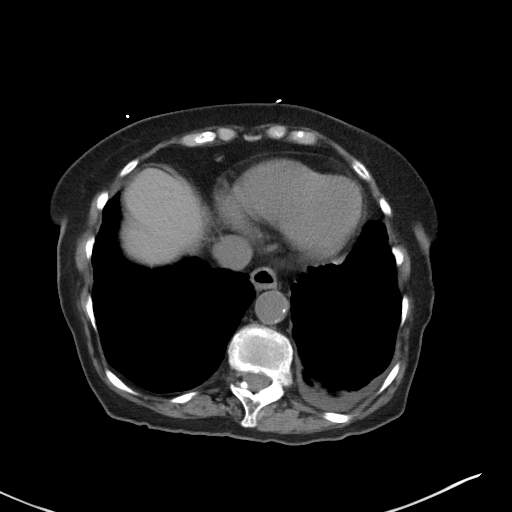
[im 77/82  soft-tissue]
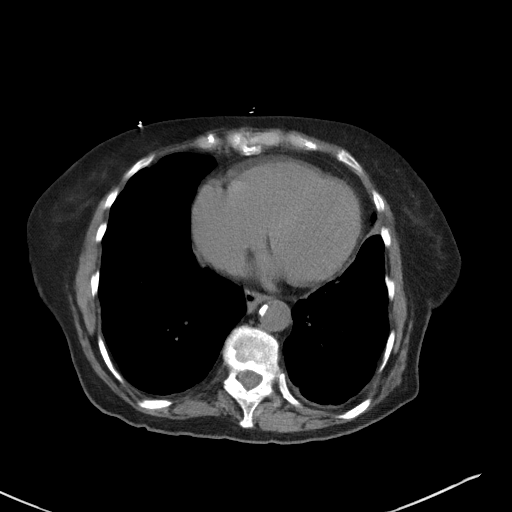

[Series 5: coronal st · coronal · 0.80mm/px · 3 of 88 slices shown]
[im 30/88  soft-tissue]
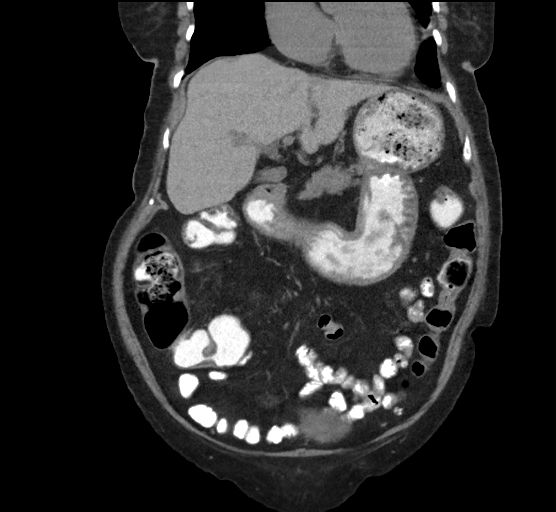
[im 39/88  soft-tissue]
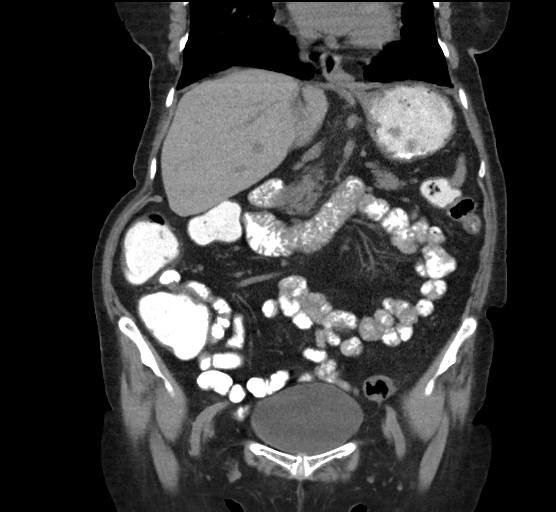
[im 49/88  soft-tissue]
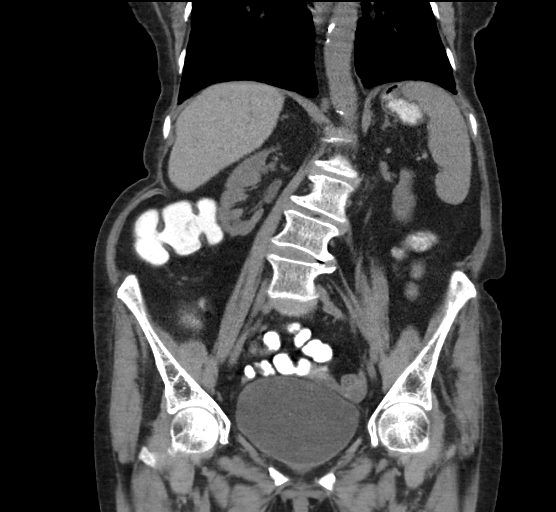

[16 of 46 positions shown; findings below may reference images not displayed]

FINDINGS: Lower chest: Small left pleural effusion identified. Similar
appearance of diffuse thickening of the peribronchovascular
interstitium. No airspace consolidation.

Hepatobiliary: No focal liver abnormality. Gallbladder appears
normal. No bile duct dilatation.

Pancreas: Unremarkable. No pancreatic ductal dilatation or
surrounding inflammatory changes.

Spleen: Normal in size without focal abnormality.

Adrenals/Urinary Tract: Normal adrenal glands.

Bilateral renal cortical thinning. 2 mm stone noted within inferior
pole of right kidney, image 35/2. Lateral cortex of upper pole of
right kidney cysts measures 2.3 cm. No hydronephrosis identified
bilaterally. No hydroureter or ureteral calculi. Bladder appears
normal.

Stomach/Bowel: The stomach is unremarkable. The small bowel loops
appear within normal limits. The appendix is visualized and appears
normal. No pathologic dilatation of the colon.

Vascular/Lymphatic: Aortic atherosclerosis. No enlarged abdominal or
pelvic lymph nodes.

Reproductive: Uterus and bilateral adnexa are unremarkable.

Other: No free fluid or fluid collections

Musculoskeletal: No acute or significant osseous findings. Scoliosis
and degenerative disc disease. No suspicious bone lesions
identified.
IMPRESSION: 1. No acute findings identified within the abdomen or pelvis.
2. No findings to suggest solid organ or nodal metastasis within the
abdomen or pelvis.
3. Nonobstructing right renal calculi.
4. Small left pleural effusion.

## 2019-03-17 MED ORDER — LOPERAMIDE HCL 2 MG PO CAPS
2.0000 mg | ORAL_CAPSULE | Freq: Every day | ORAL | Status: DC
  Administered 2019-03-18 – 2019-03-19 (×2): 2 mg via ORAL
  Filled 2019-03-17 (×2): qty 1

## 2019-03-17 MED ORDER — IOHEXOL 300 MG/ML  SOLN
50.0000 mL | Freq: Once | INTRAMUSCULAR | Status: AC | PRN
  Administered 2019-03-17: 30 mL via ORAL

## 2019-03-17 MED ORDER — LOPERAMIDE HCL 2 MG PO CAPS
2.0000 mg | ORAL_CAPSULE | Freq: Once | ORAL | Status: AC
  Administered 2019-03-17: 2 mg via ORAL
  Filled 2019-03-17: qty 1

## 2019-03-17 MED ORDER — LOPERAMIDE HCL 2 MG PO CAPS
2.0000 mg | ORAL_CAPSULE | ORAL | Status: DC | PRN
  Administered 2019-03-17: 2 mg via ORAL
  Filled 2019-03-17: qty 1

## 2019-03-17 NOTE — Progress Notes (Signed)
Subjective:  Patient states she had loose stools today.  She states she drank supplement yesterday.  She denies abdominal pain nausea or vomiting.  She states she is not getting her Imodium dose that she takes at home.  Objective: Blood pressure (!) 112/59, pulse 62, temperature 98 F (36.7 C), temperature source Oral, resp. rate 20, height 5' 4"  (1.626 m), weight 51.2 kg, SpO2 98 %. Patient is alert and in no acute distress. Abdomen is full.  Bowel sounds are normal.  On palpation is soft and nontender with organomegaly or masses.  Labs/studies Results:  CBC Latest Ref Rng & Units 03/14/2019 03/13/2019 03/07/2019  WBC 4.0 - 10.5 K/uL 7.3 9.2 5.1  Hemoglobin 12.0 - 15.0 g/dL 9.8(L) 9.9(L) 9.4(L)  Hematocrit 36.0 - 46.0 % 32.1(L) 33.1(L) 31.0(L)  Platelets 150 - 400 K/uL 326 328 281    CMP Latest Ref Rng & Units 03/17/2019 03/16/2019 03/14/2019  Glucose 70 - 99 mg/dL 94 83 100(H)  BUN 8 - 23 mg/dL 24(H) 28(H) 30(H)  Creatinine 0.44 - 1.00 mg/dL 1.00 1.36(H) 1.48(H)  Sodium 135 - 145 mmol/L 139 138 141  Potassium 3.5 - 5.1 mmol/L 3.1(L) 3.7 4.1  Chloride 98 - 111 mmol/L 102 102 104  CO2 22 - 32 mmol/L 25 27 24   Calcium 8.9 - 10.3 mg/dL 8.9 8.6(L) 9.2  Total Protein 6.5 - 8.1 g/dL - - 7.3  Total Bilirubin 0.3 - 1.2 mg/dL - - 0.5  Alkaline Phos 38 - 126 U/L - - 64  AST 15 - 41 U/L - - 13(L)  ALT 0 - 44 U/L - - 13    Hepatic Function Latest Ref Rng & Units 03/14/2019 03/02/2019 02/03/2019  Total Protein 6.5 - 8.1 g/dL 7.3 7.0 -  Albumin 3.5 - 5.0 g/dL 3.5 3.4(L) 2.9(L)  AST 15 - 41 U/L 13(L) 11(L) -  ALT 0 - 44 U/L 13 9 -  Alk Phosphatase 38 - 126 U/L 64 70 -  Total Bilirubin 0.3 - 1.2 mg/dL 0.5 0.2(L) -  Bilirubin, Direct 0.0 - 0.3 mg/dL - - -     Abdominal pelvic CT images reviewed with Dr. Thornton Papas. Study compared with one from July 11, 2017. Complete resolution of anal mass. No lymphadenopathy, abnormality of the pancreas or dilated bile duct. No gastric wall thickening. 2  mm stone within inferior pole of right kidney and right renal cyst. Left pleural effusion has decreased since CT of 03/04/2019.  Assessment:  #1.  Episodic nausea with vasomotor symptoms.  She is doing better since amiodarone dose was changed to twice daily.  CT does not reveal any abnormality involving pancreas stomach or bowel.  Since she is doing better will hold off further evaluation.  #2.  Anemia.  Anemia appears to be due to chronic disease.  No evidence of GI bleed.  #3.  Acute on chronic diastolic CHF.  Patient is being followed by Dr. Johnny Bridge.  CT reveals decrease in left pleural effusion and resolution of right pleural effusion seen on CT of 03/04/2019.  She also has chronic atrial fibrillation.   #4.  History of squamous cell carcinoma of anal canal.  Status post radiation therapy at [facility].  CT does not reveal any residual disease.  She is to follow-up with her oncologist at [facility] and to have PET scan at a later date.  #5.  History of diarrhea possibly IBS controlled with low-dose loperamide which will be resumed as before.  Diarrhea appears to be  secondary to nutritional supplement and oral contrast.  Recommendations  Loperamide 2 mg p.o. now and 2 mg p.o. every morning.

## 2019-03-17 NOTE — Progress Notes (Signed)
PROGRESS NOTE  Renee Blake [id number] DOB: 1928/12/25 DOA: 03/13/2019 PCP: [name], MD  Brief History:  85 year old female with a history of permanent atrial fibrillation not on anticoagulation secondary to GI bleed, diastolic CHF, CKD stage III, endometrial carcinoma, and squamous cell carcinoma of the anal canal presenting with 10-day history of shortness of breath, nausea, and hot flashes. Episodes occur more in afternoon & evening but do not specifically relate to meals. Reports severe episodes, occurring most days now.  She denies any fevers, chills, coughing, hemoptysis, frank emesis, diarrhea, abdominal pain.  The patient was recently admitted to the hospital from 03/04/2019 to 03/07/2019 during which time she was treated for acute on chronic diastolic CHF. Marland Kitchen Feels weight is overall stable (has varied below due to fluid retention/diuretics), daughter denies concerning weight loss and only has poor appetite during/after episodes. Has remote hx of gastric ulcers and denies any nsaid use currently.  The patient was noted initially to be hypoxic with oxygen saturation 88% on room air.  She was initially stable on 3 L and has been since weaned off of oxygen.  Cardiology and GI have been consulted to assist with management.  Assessment/Plan: Acute respiratory failure with hypoxia -Secondary to mild exacerbation of diastolic CHF -Patient was successfully weaned off 3 L of oxygen to room air -Status post 2 dose of IV Lasix 40 mg on the 13th and the 14th. -continue p.o. diuretics  Acute on chronic diastolic CHF -appreciate cardiology follow up -continue po lasix and metoprolol succinate -01/19/19 Echo EF 60-65%, G2DD, moderate elevated PASP -appears clinically euvolemic  Permanent Atrial Fibrillation -continue diltiazem CD in am, metoprolol succinate at hs -continue amiodarone at hs -not on AC due to GIB, duodenal ulcers, anemia  Nausea/hot flash/FTT -Post ingestion of  morning meds developing nausea, hot flashes, but no persistent vomiting -evaluate medication and divided the doses -As needed antiemetics -No signs of infection -CT abd/pelvis per GI -History of GI bleed,>> H&H remained stable -repeat Hemoccult--neg -CEA normal at 4.4 -GI consulted, appreciate input -PT eval-->SNF -check TSH, Free T4 -check B12 and folate  CKD stage 3b -baseline creatinine 1.2-1.5 -am BMP  History of anorectal cancer/endometrial carcinoma/ -Hemoccult--neg -Stable -CEA normal at 4.4 -GI consulted -patient followed by [facility], GYN and medical oncology team from [facility] by [place]. No recent follow-up -Last Colonoscopy: April 2019-new findings include nodular hard mass anterior anal orifice, DRE revealed 5 cm hard rectal mass circumferential anterior bowel wall, 6 mm polyp distal sigmoid. Path w/ rectum distal lesion biopsy-at least high-grade squamous intraepithelial lesion suspicious for invasion, sigmoid polyp inflammatory        Disposition Plan:   Home when cleared by GI/cards Family Communication:   Family at bedside  Consultants:  GI, cardiology  Code Status:   DNR  DVT Prophylaxis: [place] Lovenox   Procedures: As Listed in Progress Note Above  Antibiotics: None       Subjective: Pt states hot flashes and nausea are improving.  She had loose stools with ensure.  Denies f/c, cp, sob, vomiting, abd pain, dysuria, hematochezia, melena  Objective: Vitals:   03/16/19 0517 03/16/19 1936 03/16/19 2101 03/17/19 0452  BP: 124/76  119/75 (!) 112/59  Pulse: 93  80 62  Resp: 20  20 20   Temp: 97.9 F (36.6 C)  97.6 F (36.4 C) 98 F (36.7 C)  TempSrc: Oral  Oral Oral  SpO2: 99% 92% 100% 98%  Weight:  Height:        Intake/Output Summary (Last 24 hours) at 03/17/2019 0905 Last data filed at 03/16/2019 1700 Gross per 24 hour  Intake 240 ml  Output --  Net 240 ml   Weight change:  Exam:   General:  Pt is alert, follows commands  appropriately, not in acute distress  HEENT: No icterus, No thrush, No neck mass, [place]/AT  Cardiovascular: IRRR, S1/S2, no rubs, no gallops  Respiratory: CTA bilaterally, no wheezing, no crackles, no rhonchi  Abdomen: Soft/+BS, non tender, non distended, no guarding  Extremities: No edema, No lymphangitis, No petechiae, No rashes, no synovitis   Data Reviewed: I have personally reviewed following labs and imaging studies Basic Metabolic Panel: Recent Labs  Lab 03/13/19 2137 03/14/19 0449 03/16/19 0421 03/17/19 0427  NA 140 141 138 139  K 5.6* 4.1 3.7 3.1*  CL 105 104 102 102  CO2 26 24 27 25   GLUCOSE 118* 100* 83 94  BUN 36* 30* 28* 24*  CREATININE 1.77* 1.48* 1.36* 1.00  CALCIUM 9.1 9.2 8.6* 8.9   Liver Function Tests: Recent Labs  Lab 03/14/19 0449  AST 13*  ALT 13  ALKPHOS 64  BILITOT 0.5  PROT 7.3  ALBUMIN 3.5   No results for input(s): LIPASE, AMYLASE in the last 168 hours. No results for input(s): AMMONIA in the last 168 hours. Coagulation Profile: No results for input(s): INR, PROTIME in the last 168 hours. CBC: Recent Labs  Lab 03/13/19 2137 03/14/19 0449  WBC 9.2 7.3  HGB 9.9* 9.8*  HCT 33.1* 32.1*  MCV 94.8 93.6  PLT 328 326   Cardiac Enzymes: No results for input(s): CKTOTAL, CKMB, CKMBINDEX, TROPONINI in the last 168 hours. BNP: Invalid input(s): POCBNP CBG: No results for input(s): GLUCAP in the last 168 hours. HbA1C: No results for input(s): HGBA1C in the last 72 hours. Urine analysis:    Component Value Date/Time   COLORURINE YELLOW 01/27/2019 1533   APPEARANCEUR CLOUDY (A) 01/27/2019 1533   LABSPEC 1.005 01/27/2019 1533   PHURINE 7.0 01/27/2019 1533   GLUCOSEU NEGATIVE 01/27/2019 1533   HGBUR SMALL (A) 01/27/2019 1533   BILIRUBINUR NEGATIVE 01/27/2019 1533   [place] 01/27/2019 1533   PROTEINUR NEGATIVE 01/27/2019 1533   UROBILINOGEN 0.2 08/22/2014 1300   NITRITE NEGATIVE 01/27/2019 1533   LEUKOCYTESUR LARGE (A)  01/27/2019 1533   Sepsis Labs: @LABRCNTIP (procalcitonin:4,lacticidven:4) ) Recent Results (from the past 240 hour(s))  SARS CORONAVIRUS 2 (Denine Brotz 6-24 HRS) Nasopharyngeal Nasopharyngeal Swab     Status: None   Collection Time: 03/13/19  9:58 PM   Specimen: Nasopharyngeal Swab  Result Value Ref Range Status   SARS Coronavirus 2 NEGATIVE NEGATIVE Final    Comment: (NOTE) SARS-CoV-2 target nucleic acids are NOT DETECTED. The SARS-CoV-2 RNA is generally detectable in upper and lower respiratory specimens during the acute phase of infection. Negative results do not preclude SARS-CoV-2 infection, do not rule out co-infections with other pathogens, and should not be used as the sole basis for treatment or other patient management decisions. Negative results must be combined with clinical observations, patient history, and epidemiological information. The expected result is Negative. Fact Sheet for Patients: SugarRoll.be Fact Sheet for Healthcare Providers: https://www.woods-mathews.com/ This test is not yet approved or cleared by the [place] FDA and  has been authorized for detection and/or diagnosis of SARS-CoV-2 by FDA under an Emergency Use Authorization (EUA). This EUA will remain  in effect (meaning this test can be used) for the duration of the  COVID-19 declaration under Section 56 4(b)(1) of the Act, 21 U.S.C. section 360bbb-3(b)(1), unless the authorization is terminated or revoked sooner. Performed at [facility], [place] [address] [place], [place] [address]      Scheduled Meds: . amiodarone  200 mg Oral QPM  . diltiazem  180 mg Oral Daily  . enoxaparin (LOVENOX) injection  30 mg Subcutaneous Q24H  . feeding supplement  1 Container Oral TID BM  . furosemide  40 mg Oral Daily  . loratadine  10 mg Oral Daily  . metoprolol succinate  50 mg Oral QPM  . multivitamin with minerals  1 tablet Oral Daily  . pantoprazole  40 mg  Oral QPM  . sodium chloride flush  3 mL Intravenous Q12H   Continuous Infusions: . sodium chloride      Procedures/Studies: DG Chest 1 View  Result Date: 03/04/2019 CLINICAL DATA:  LEFT pleural effusion post thoracentesis EXAM: CHEST  1 VIEW COMPARISON:  03/02/2019 FINDINGS: Enlargement of cardiac silhouette with pulmonary vascular congestion. Scattered interstitial infiltrates favor pulmonary edema. Small loculated pleural effusion at lateral LEFT lung base decreased from prior study. No pneumothorax following thoracentesis. Atherosclerotic calcification aorta. Bones demineralized. IMPRESSION: No pneumothorax following LEFT thoracentesis. Enlargement of cardiac silhouette with vascular congestion and mild pulmonary edema, slightly improved from previous study. Electronically Signed   By: Lavonia Dana M.D.   On: 03/04/2019 14:45   cxr  Result Date: 03/07/2019 CLINICAL DATA:  Status post thoracentesis. EXAM: CHEST - 2 VIEW COMPARISON:  03/04/2019. FINDINGS: Normal heart size. Aortic atherosclerosis. There is been interval decrease in volume of the left pleural effusion status post thoracentesis. No appreciable pneumothorax identified. Mild diffuse increase interstitial markings are identified, unchanged. IMPRESSION: Mild diffuse edema, similar to previous exam. Decrease in volume of left pleural effusion status post thoracentesis. Electronically Signed   By: Kerby Moors M.D.   On: 03/07/2019 13:38   CT Angio Chest PE W and/or Wo Contrast  Result Date: 03/04/2019 CLINICAL DATA:  PE suspected, high pretest probability. Shortness of breath. Atrial fibrillation. Patient discharged from [facility] yesterday for treatment of congestive heart failure. EXAM: CT ANGIOGRAPHY CHEST WITH CONTRAST TECHNIQUE: Multidetector CT imaging of the chest was performed using the standard protocol during bolus administration of intravenous contrast. Multiplanar CT image reconstructions and MIPs were obtained to  evaluate the vascular anatomy. CONTRAST:  134mL OMNIPAQUE IOHEXOL 350 MG/ML SOLN COMPARISON:  One-view chest x-ray 03/02/2019. CTA chest 08/01/2017 FINDINGS: Cardiovascular: The heart is mildly enlarged. Mild atherosclerotic changes are noted in the aorta without aneurysm. Great vessel origins are within normal limits. Pulmonary artery opacification is excellent. Main pulmonary artery is mildly enlarged, measuring 3.7 cm. No focal filling defects are present to suggest pulmonary emboli. Branch vessels are within normal limits. Mediastinum/Nodes: No significant mediastinal, hilar, or axillary adenopathy is present. Lungs/Pleura: Extensive pleural thickening is present throughout the right lung. Pleural nodular disease is present in the right lower lobe with soft tissue adjacent to the superior segment measuring up to 1.5 cm. Patchy nodular densities are present in the upper lobes bilaterally. 8 mm focal nodule in the right upper lobe is pleural based and stable. Bilateral layering effusions are present, left greater than right. There is partial collapse of the lower lobes bilaterally, left greater than right. Upper Abdomen: No acute abnormality. Musculoskeletal: No chest wall abnormality. No acute or significant osseous findings. Review of the MIP images confirms the above findings. IMPRESSION: 1. No pulmonary embolus. 2. Mild enlargement of the  main pulmonary artery suggesting pulmonary arterial hypertension. 3. Extensive pleural thickening throughout the right lung. This is new from the prior exam. It may be related to the acute effusions and reason pulmonary disease. Recommend follow-up CT of the chest with contrast after treatment of acute disease and volume overload. 4. Bilateral pleural effusions, left greater than right. 5. Bilateral lower lobe collapse, left greater than right. 6. Aortic Atherosclerosis (ICD10-I70.0). 7. Electronically Signed   By: San Morelle M.D.   On: 03/04/2019 05:30   DG Chest  Portable 1 View  Result Date: 03/13/2019 CLINICAL DATA:  Shortness of breath EXAM: PORTABLE CHEST 1 VIEW COMPARISON:  03/07/2019 FINDINGS: Stable cardiomegaly. Calcified aortic knob. Mildly prominent interstitial markings are similar to prior. No new focal airspace consolidation. No significant pleural fluid collection. No pneumothorax. IMPRESSION: Cardiomegaly with persistent increased interstitial markings, which could reflect a component of edema. No new focal airspace consolidation. Electronically Signed   By: Davina Poke M.D.   On: 03/13/2019 22:18   XR Chest Portable  Result Date: 03/02/2019 CLINICAL DATA:  AFib, shortness of breath EXAM: PORTABLE CHEST 1 VIEW COMPARISON:  January 31, 2019 FINDINGS: There is cardiomegaly. Increased interstitial markings are now seen throughout both lungs. There is blunting of the bilateral costophrenic angles which could be due to trace bilateral pleural effusions. No acute osseous abnormality. IMPRESSION: Cardiomegaly and interstitial edema Probable trace bilateral pleural effusions Electronically Signed   By: Prudencio Pair M.D.   On: 03/02/2019 03:31   US THORACENTESIS ASP PLEURAL SPACE W/IMG GUIDE  Addendum Date: 03/04/2019   ADDENDUM REPORT: 03/04/2019 15:07 ADDENDUM: Error in initial report. Removed LEFT pleural fluid was YELLOW, not serosanguinous. Remainder of report correct as dictated. Electronically Signed   By: Lavonia Dana M.D.   On: 03/04/2019 15:07   Result Date: 03/04/2019 INDICATION: LEFT pleural effusion EXAM: ULTRASOUND GUIDED DIAGNOSTIC LEFT THORACENTESIS MEDICATIONS: None COMPLICATIONS: None immediate PROCEDURE: An ultrasound guided thoracentesis was thoroughly discussed with the patient and questions answered. The benefits, risks, alternatives and complications were also discussed. The patient understands and wishes to proceed with the procedure. Written consent was obtained. Ultrasound was performed to localize and mark an adequate pocket  of fluid in the LEFT chest. The area was then prepped and draped in the normal sterile fashion. 1% Lidocaine was used for local anesthesia. Under ultrasound guidance a 8 French thoracentesis catheter was introduced. Thoracentesis was performed. The catheter was removed and a dressing applied. FINDINGS: A total of approximately 230 mL of serosanguineous LEFT pleural fluid was removed. Septations are seen within the effusion, appeared multiloculated. Samples were sent to the laboratory as requested by the clinical team. IMPRESSION: Successful ultrasound guided LEFT thoracentesis yielding 230 mL of pleural fluid. Electronically Signed: By: Lavonia Dana M.D. On: 03/04/2019 14:48    Orson Eva, DO  [facility] Pager [phone]  If 7PM-7AM, please contact night-coverage [email or web address] Password [email or web address] 03/17/2019, 9:05 AM   LOS: 3 days

## 2019-03-17 NOTE — Care Management Important Message (Addendum)
Important Message  Patient Details  Name: Renee Blake MRN: [id number] Date of Birth: 1928/11/24   Medicare Important Message Given:  Yes(Daniel, RN will deliver to patient)  Letter was placed on patient's chart, Quillian Quince, RN will deliver to patient   [name] 03/17/2019, 3:18 PM

## 2019-03-17 NOTE — Progress Notes (Addendum)
Progress Note  Patient Name: Renee Blake Date of Encounter: 03/17/2019  Primary Cardiologist: Kate Sable, MD   Subjective   Breathing at baseline. No chest pain or palpitations overnight. She denies any nausea this AM. Currently drinking contrast agent for scheduled Abdominal CT.   Inpatient Medications    Scheduled Meds: . amiodarone  200 mg Oral QPM  . diltiazem  180 mg Oral Daily  . enoxaparin (LOVENOX) injection  30 mg Subcutaneous Q24H  . feeding supplement  1 Container Oral TID BM  . furosemide  40 mg Oral Daily  . loratadine  10 mg Oral Daily  . metoprolol succinate  50 mg Oral QPM  . multivitamin with minerals  1 tablet Oral Daily  . pantoprazole  40 mg Oral QPM  . sodium chloride flush  3 mL Intravenous Q12H   Continuous Infusions: . sodium chloride     PRN Meds: sodium chloride, acetaminophen, ALPRAZolam, dicyclomine, meclizine, ondansetron **OR** ondansetron (ZOFRAN) IV, sodium chloride flush   Vital Signs    Vitals:   03/16/19 0517 03/16/19 1936 03/16/19 2101 03/17/19 0452  BP: 124/76  119/75 (!) 112/59  Pulse: 93  80 62  Resp: 20  20 20   Temp: 97.9 F (36.6 C)  97.6 F (36.4 C) 98 F (36.7 C)  TempSrc: Oral  Oral Oral  SpO2: 99% 92% 100% 98%  Weight:      Height:        Intake/Output Summary (Last 24 hours) at 03/17/2019 0927 Last data filed at 03/17/2019 0900 Gross per 24 hour  Intake 480 ml  Output --  Net 480 ml    Last 3 Weights 03/14/2019 03/13/2019 03/07/2019  Weight (lbs) 110 lb 14.3 oz 109 lb 110 lb 3.7 oz  Weight (kg) 50.3 kg 49.442 kg 50 kg      Telemetry    Atrial fibrillation, HR mostly in 70's to 90's, peaking into 120's with activity. - Personally Reviewed  ECG    No new tracings.   Physical Exam   General: Thin Caucasian female appearing in no acute distress. Head: Normocephalic, atraumatic.  Neck: Supple without bruits, JVD not elevated. Lungs:  Resp regular and unlabored, CTA without wheezing or  rales. Heart: Irregularly irregular, S1, S2, no S3, S4, or murmur; no rub. Abdomen: Soft, non-tender, non-distended with normoactive bowel sounds. No hepatomegaly. No rebound/guarding. No obvious abdominal masses. Extremities: No clubbing, cyanosis, or lower extremity edema. Distal pedal pulses are 2+ bilaterally. Neuro: Alert and oriented X 3. Moves all extremities spontaneously. Psych: Normal affect.  Labs    Chemistry Recent Labs  Lab 03/14/19 0449 03/16/19 0421 03/17/19 0427  NA 141 138 139  K 4.1 3.7 3.1*  CL 104 102 102  CO2 24 27 25   GLUCOSE 100* 83 94  BUN 30* 28* 24*  CREATININE 1.48* 1.36* 1.00  CALCIUM 9.2 8.6* 8.9  PROT 7.3  --   --   ALBUMIN 3.5  --   --   AST 13*  --   --   ALT 13  --   --   ALKPHOS 64  --   --   BILITOT 0.5  --   --   GFRNONAA 31* 35* 50*  GFRAA 36* 40* 58*  ANIONGAP 13 9 12      Hematology Recent Labs  Lab 03/13/19 2137 03/14/19 0449  WBC 9.2 7.3  RBC 3.49* 3.43*  HGB 9.9* 9.8*  HCT 33.1* 32.1*  MCV 94.8 93.6  MCH 28.4 28.6  MCHC  29.9* 30.5  RDW 14.7 14.6  PLT 328 326    BNP Recent Labs  Lab 03/13/19 2137 03/16/19 0421 03/17/19 0427  BNP 478.0* 152.0* 134.0*     Radiology    No results found.  Cardiac Studies   Echocardiogram: 01/28/2019 IMPRESSIONS    1. Left ventricular ejection fraction, by visual estimation, is 60 to 65%. The left ventricle has normal function. There is no left ventricular hypertrophy.  2. Elevated left atrial pressure.  3. Left ventricular diastolic parameters are consistent with Grade II diastolic dysfunction (pseudonormalization).  4. Global right ventricle has normal systolic function.The right ventricular size is normal. No increase in right ventricular wall thickness.  5. Left atrial size was severely dilated.  6. Right atrial size was mildly dilated.  7. The mitral valve is abnormal. Moderate mitral valve regurgitation. No evidence of mitral stenosis.  8. The MR vena contracta is  0.6 cm. The MV/AV TVI ratio is 1.1. Findings support moderate mitral regurgitation.  9. The tricuspid valve is normal in structure. Tricuspid valve regurgitation is mild. 10. The aortic valve is tricuspid. Aortic valve regurgitation is not visualized. No evidence of aortic valve sclerosis or stenosis. 11. The pulmonic valve was not well visualized. Pulmonic valve regurgitation is mild. 12. Moderately elevated pulmonary artery systolic pressure. 13. The inferior vena cava is normal in size with greater than 50% respiratory variability, suggesting right atrial pressure of 3 mmHg.  Patient Profile     85 y.o. female w/ PMH of chronic diastolic CHF, HTN, history of anorectal cancer (s/p radiation treatment), persistent atrial fibrillation Stage 3 CKD and chronic anemia with history of GIB currently admitted for an acute CHF exacerbation.   Assessment & Plan    1. Persistent Atrial Fibrillation - she was previously having worsening nausea after taking her AM medications, therefore these have been divided up to Cardizem CD 180mg  in AM and Amiodarone 200 mg and Toprol-XL 50mg  in the evening hours. Rates mostly well controlled in the 70's to 90's, peaking into 120's with activity. Follow on telemetry with recent adjustments in her schedule. BP has limited further titration of her AV nodal blocking agents but as previously mentioned, may need to consider stopping Amio if nausea does not improve.  - she has not been on anticoagulation given her chronic anemia and history of GIB.   2. Acute on Chronic Diastolic CHF - BNP elevated to 478 on admission and CXR showing pulmonary edema. Repeat BNP this AM has improved to 134. - She has been switched to PO Lasix 40mg . I&O's have not been recorded and last recorded weight was 110 lbs on 12/13. Weight previously 113 lbs at the time of hospital discharge earlier this month. Will ask for a standing weight to be obtained.   3. Anemia - Hgb stable at 9.8 on most  recent check. Occult blood negative. GI was consulted by the admitting team and she is having an Abdominal CT Scan today.   4. Deconditioning/Decreased Appetite - she has been evaluated by PT this admission with SNF recommended at the time of discharge.    For questions or updates, please contact [place] Please consult [email or web address] for contact info under Cardiology/STEMI.   Arna Medici , PA-C 9:27 AM 03/17/2019 Pager: [phone]   Attending note:  Chart reviewed and case discussed with Renee Blake, I agree with her above findings and recommendations.  Heart rate in atrial fibrillation is reasonably well controlled on present regimen including Cardizem CD 180  mg in the morning followed by amiodarone 200 mg and Toprol-XL 50 mg in the evening.  Continue oral Lasix with follow-up weight pending, although she does not look to be fluid overloaded.  No substantial change in examination.  Further work-up underway per GI and she has also had PT evaluation with recommendation for SNF.  [facility] will sign off.   Medication Recommendations:  As above. If nausea does not improve without other cause identified, could consider stopping Amiodarone. Other recommendations (labs, testing, etc): No further cardiac testing anticipated. Follow up as an outpatient: Can arrange routine follow-up with Dr. Bronson Ing after discharge in approximately 4 weeks.  Satira Sark, M.D., F.A.C.C.

## 2019-03-17 NOTE — Progress Notes (Signed)
PT Cancellation Note  Patient Details Name: Renee Blake MRN: [id number] DOB: 1928/12/28   Cancelled Treatment:    Reason Eval/Treat Not Completed: Other (comment) Attempted PT session.  Pt c/o diarrhea following lunch and does not wish to get out of bed.  Pt left with call bell within reach.  [address], LPTA; [place] Aldona Lento 03/17/2019, 1:38 PM

## 2019-03-18 LAB — BASIC METABOLIC PANEL
Anion gap: 10 (ref 5–15)
BUN: 20 mg/dL (ref 8–23)
CO2: 27 mmol/L (ref 22–32)
Calcium: 9 mg/dL (ref 8.9–10.3)
Chloride: 102 mmol/L (ref 98–111)
Creatinine, Ser: 0.97 mg/dL (ref 0.44–1.00)
GFR calc Af Amer: >60 mL/min (ref >60)
GFR calc non Af Amer: 52 mL/min — ABNORMAL LOW (ref >60)
Glucose, Bld: 87 mg/dL (ref 70–99)
Potassium: 3.1 mmol/L — ABNORMAL LOW (ref 3.5–5.1)
Sodium: 139 mmol/L (ref 135–145)

## 2019-03-18 LAB — FOLATE: Folate: 13.4 ng/mL (ref >5.9)

## 2019-03-18 LAB — VITAMIN B12: Vitamin B-12: 158 pg/mL — ABNORMAL LOW (ref 180–914)

## 2019-03-18 LAB — TSH: TSH: 13.447 u[IU]/mL — ABNORMAL HIGH (ref 0.350–4.500)

## 2019-03-18 LAB — T4, FREE: Free T4: 1.07 ng/dL (ref 0.61–1.12)

## 2019-03-18 MED ORDER — AMIODARONE HCL 200 MG PO TABS: 200.0000 mg | ORAL_TABLET | Freq: Every evening | ORAL | Status: DC

## 2019-03-18 MED ORDER — ALPRAZOLAM 0.25 MG PO TABS: 0.2500 mg | ORAL_TABLET | Freq: Two times a day (BID) | ORAL | 0 refills | Status: DC | PRN

## 2019-03-18 MED ORDER — METOPROLOL SUCCINATE ER 50 MG PO TB24: 50.0000 mg | ORAL_TABLET | Freq: Every evening | ORAL | Status: DC

## 2019-03-18 MED ORDER — POTASSIUM CHLORIDE CRYS ER 20 MEQ PO TBCR
40.0000 meq | EXTENDED_RELEASE_TABLET | Freq: Once | ORAL | Status: AC
  Administered 2019-03-18: 40 meq via ORAL
  Filled 2019-03-18: qty 2

## 2019-03-18 MED ORDER — ADULT MULTIVITAMIN W/MINERALS CH: 1.0000 | ORAL_TABLET | Freq: Every day | ORAL | Status: DC

## 2019-03-18 NOTE — Progress Notes (Addendum)
Nsg Discharge Note  Admit Date:  03/13/2019 Discharge date: 03/18/2019   [name] to be D/C'd Rehab per MD order.  AVS completed.  Copy for chart, and copy for patient signed, and dated. Removed IV-clean, dry, intact. Called report to [employer] at [facility] rehab. [facility] was called to pick up patient to transport to [place] [facility] patients were waiting for [facility] to come and transport them to Rehab/SNFshe was in the queue. Patient's daughter, [name], called and asked [place] if she could  transport her mother herself. I called Ambrose Pancoast, case management, to ask if patient could be transported by daughter. Nira Conn said she needed to go by EMS. Gilmore Laroche, my orientee, called [name] to let her know that her mother needed to be transported by EMS. Around 1800, [name] called Gilmore Laroche and asked if her mother could spend the night since it was so late. Gilmore Laroche explained that EMS had been called and would be transporting her mother soon. When I left at 2000, patient was still waiting to be transported. When I arrived this morning at 0700, patient was still here. In report, night charge nurse, Nicole Cella, stated that when EMS came to pick up patient around 2200, she jumped out of bed to get into the stretcher. EMS stated that they could not transport her because she was ambulatory. Christy called [name] to let her know that patient was not being transported and said she would talk with CM Friday about transportation.  [name] stated that she would pick her mother up at 1000 Friday morning to deliver her to [facility] herself. At 0800, I text messaged Ambrose Pancoast to update her on the situation and asked if it was okay for [name] to transport her. She said that was fine.  At 0930 today I talked with [name] and she said she would be at the main entrance at 1000. I told her that we would have her mother at the main entrance at 39.  At 1000 I wheeled stable patient and belongings to main  entrance where she was picked up by [place] and a man driving the car to transport her to [facility].   Discharge Medication: Allergies as of 03/18/2019      Reactions   Clonazepam Other (See Comments)   Pruritis   Codeine Nausea And Vomiting, Other (See Comments)   Reaction unknown   Sulfa Antibiotics Other (See Comments)   Reactions unknown      Medication List    STOP taking these medications   acetaminophen 325 MG tablet Commonly known as: TYLENOL     TAKE these medications   ALPRAZolam 0.25 MG tablet Commonly known as: XANAX Take 1 tablet (0.25 mg total) by mouth 2 (two) times daily as needed. for anxiety   amiodarone 200 MG tablet Commonly known as: PACERONE Take 1 tablet (200 mg total) by mouth every evening. What changed: when to take this   dicyclomine 10 MG capsule Commonly known as: BENTYL Take 10 mg by mouth every 4 (four) hours as needed for spasms.   diltiazem 180 MG 24 hr capsule Commonly known as: CARDIZEM CD Take 1 capsule (180 mg total) by mouth daily.   furosemide 40 MG tablet Commonly known as: LASIX Take 1 tablet (40 mg total) by mouth daily. May take additional dose if shortness of breath or fluid buildup   levocetirizine 5 MG tablet Commonly known as: XYZAL Take 5 mg by mouth every evening.   meclizine 25 MG  tablet Commonly known as: ANTIVERT Take 25 mg by mouth daily as needed for dizziness.   metoprolol succinate 50 MG 24 hr tablet Commonly known as: TOPROL-XL Take 1 tablet (50 mg total) by mouth every evening. Take with or immediately following a meal. What changed:   medication strength  when to take this  additional instructions   multivitamin with minerals Tabs tablet Take 1 tablet by mouth daily. Start taking on: March 19, 2019   pantoprazole 40 MG tablet Commonly known as: PROTONIX Take 1 tablet (40 mg total) by mouth daily.   Zofran 4 MG tablet Generic drug: ondansetron Take 4 mg by mouth every 8 (eight)  hours as needed for nausea or vomiting.       Discharge Assessment: Vitals:   03/18/19 1424 03/18/19 1631  BP: 91/68 123/63  Pulse: 80 85  Resp: 18 20  Temp: 97.7 F (36.5 C) 97.7 F (36.5 C)  SpO2: 97% 98%   Skin clean, dry and intact without evidence of skin break down, no evidence of skin tears noted. IV catheter discontinued intact. Site without signs and symptoms of complications - no redness or edema noted at insertion site, patient denies c/o pain - only slight tenderness at site.  Dressing with slight pressure applied.  D/c Instructions-Education: Discharge instructions given to patient/family with verbalized understanding. D/c education completed with patient/family including follow up instructions, medication list, d/c activities limitations if indicated, with other d/c instructions as indicated by MD - patient able to verbalize understanding, all questions fully answered. Patient instructed to return to ED, call 911, or call MD for any changes in condition.  Patient escorted via [place], and D/C home via private auto.  Santa Lighter, RN 03/18/2019 4:33 PM

## 2019-03-18 NOTE — Discharge Summary (Signed)
Physician Discharge Summary  Renee Blake [id number] DOB: 10/27/1928 DOA: 03/13/2019  PCP: [name], MD  Admit date: 03/13/2019 Discharge date: 03/18/2019  Admitted From: Home Disposition:  SNF  Recommendations for Outpatient Follow-up:  1. Follow up with PCP in 1-2 weeks 2. Please obtain BMP/CBC in one week     Discharge Condition: Stable CODE STATUS: DNR Diet recommendation:  Regular   Brief/Interim Summary: 85 year old female with a history of permanent atrial fibrillation not on anticoagulation secondary to GI bleed, diastolic CHF, CKD stage III, endometrial carcinoma, and squamous cell carcinoma of the anal canal presenting with 10-day history of shortness of breath, nausea, and hot flashes. Episodes occur more in afternoon & evening but do not specifically relate to meals. Reports severe episodes, occurring most days now.  She denies any fevers, chills, coughing, hemoptysis, frank emesis, diarrhea, abdominal pain.  The patient was recently admitted to the hospital from 03/04/2019 to 03/07/2019 during which time she was treated for acute on chronic diastolic CHF. Marland Kitchen Feels weight is overallstable (has varied below due to fluid retention/diuretics), daughter denies concerning weight loss and only has poor appetite during/after episodes. Has remote hx of gastric ulcersanddenies any nsaid use currently.  The patient was noted initially to be hypoxic with oxygen saturation 88% on room air.  She was initially stable on 3 L and has been since weaned off of oxygen.  Cardiology and GI have been consulted to assist with management.  Discharge Diagnoses:   Acute respiratory failure with hypoxia -Secondary to mild exacerbation of diastolic CHF -Patient was successfully weaned off 3 L of oxygen to room air -Status post 2 dose of IV Lasix 40 mg on the 13th and the 14th. -continue p.o. lasix 40 mg daily  Acute on chronic diastolic CHF -appreciate cardiology follow up -continue  po lasix and metoprolol succinate -01/19/19 Echo EF 60-65%, G2DD, moderate elevated PASP -appears clinically euvolemic--discharge weight 112.8 lbs  Permanent Atrial Fibrillation -continue diltiazem CD in am, metoprolol succinate at hs -continue amiodarone at hs -not on AC due to GIB, duodenal ulcers, anemia  Nausea/hot flash/FTT -Post ingestion of morning meds developing nausea, hot flashes, but no persistent vomiting -evaluate medication and dividedthe doses -now with amiodarone and metoprolol at hs and cardizem in am-->improving symptoms; now tolerating diet -As needed antiemetics -No signs of infection -CT abd/pelvis per GI -History of GI bleed,>> H&H remained stable -repeat Hemoccult--neg -CEA normal at 4.4 -GI consulted, appreciate input -PT eval-->SNF -check TSH, Free T4--pending at time of d/c -check B12 and folate--pending at time of d/c  CKD stage 3b -baseline creatinine 1.2-1.5 -am BMP  History of SCC anorectal cancer/endometrial carcinoma/ -Hemoccult--neg -Stable -CEA normal at 4.4 -GIconsulted-->CT abd pelvis -[facility] followed [facility], GYN and medical oncology team from [facility]. No recent follow-up -12/16 CT abd/pelvis--no acute findings, no mets, nonobstructine R-renal calculi -Last Colonoscopy:April 2019-new findings include nodular hard mass anterior anal orifice, DRE revealed 5 cm hard rectal masscircumferential anterior bowel wall, 6 mm polyp distal sigmoid. Path w/rectum distal lesion biopsy-at least high-grade squamous intraepithelial lesion suspicious for invasion, sigmoid polyp inflammatory     Discharge Instructions   Allergies as of 03/18/2019      Reactions   Clonazepam Other (See Comments)   Pruritis   Codeine Nausea And Vomiting, Other (See Comments)   Reaction unknown   Sulfa Antibiotics Other (See Comments)   Reactions unknown      Medication List    STOP taking these medications  acetaminophen 325 MG  tablet Commonly known as: TYLENOL     TAKE these medications   ALPRAZolam 0.25 MG tablet Commonly known as: XANAX Take 1 tablet (0.25 mg total) by mouth 2 (two) times daily as needed. for anxiety   amiodarone 200 MG tablet Commonly known as: PACERONE Take 1 tablet (200 mg total) by mouth every evening. What changed: when to take this   dicyclomine 10 MG capsule Commonly known as: BENTYL Take 10 mg by mouth every 4 (four) hours as needed for spasms.   diltiazem 180 MG 24 hr capsule Commonly known as: CARDIZEM CD Take 1 capsule (180 mg total) by mouth daily.   furosemide 40 MG tablet Commonly known as: LASIX Take 1 tablet (40 mg total) by mouth daily. May take additional dose if shortness of breath or fluid buildup   levocetirizine 5 MG tablet Commonly known as: XYZAL Take 5 mg by mouth every evening.   meclizine 25 MG tablet Commonly known as: ANTIVERT Take 25 mg by mouth daily as needed for dizziness.   metoprolol succinate 50 MG 24 hr tablet Commonly known as: TOPROL-XL Take 1 tablet (50 mg total) by mouth every evening. Take with or immediately following a meal. What changed:   medication strength  when to take this  additional instructions   multivitamin with minerals Tabs tablet Take 1 tablet by mouth daily. Start taking on: March 19, 2019   pantoprazole 40 MG tablet Commonly known as: PROTONIX Take 1 tablet (40 mg total) by mouth daily.   Zofran 4 MG tablet Generic drug: ondansetron Take 4 mg by mouth every 8 (eight) hours as needed for nausea or vomiting.      Contact information for after-discharge care    Destination    [facility] .   Service: Skilled Nursing Contact information: [address] [place] [phone]             Allergies  Allergen Reactions  . Clonazepam Other (See Comments)    Pruritis  . Codeine Nausea And Vomiting and Other  (See Comments)    Reaction unknown  . Sulfa Antibiotics Other (See Comments)    Reactions unknown    Consultations:  Cardiology, GI   Procedures/Studies: CT ABDOMEN PELVIS WO CONTRAST  Result Date: 03/17/2019 CLINICAL DATA:  Loss of weight. History of squamous cell carcinoma of the anal canal and vagina. There is also a history of endometrial carcinoma. EXAM: CT ABDOMEN AND PELVIS WITHOUT CONTRAST TECHNIQUE: Multidetector CT imaging of the abdomen and pelvis was performed following the standard protocol without IV contrast. COMPARISON:  07/11/17 FINDINGS: Lower chest: Small left pleural effusion identified. Similar appearance of diffuse thickening of the peribronchovascular interstitium. No airspace consolidation. Hepatobiliary: No focal liver abnormality. Gallbladder appears normal. No bile duct dilatation. Pancreas: Unremarkable. No pancreatic ductal dilatation or surrounding inflammatory changes. Spleen: Normal in size without focal abnormality. Adrenals/Urinary Tract: Normal adrenal glands. Bilateral renal cortical thinning. 2 mm stone noted within inferior pole of right kidney, image 35/2. Lateral cortex of upper pole of right kidney cysts measures 2.3 cm. No hydronephrosis identified bilaterally. No hydroureter or ureteral calculi. Bladder appears normal. Stomach/Bowel: The stomach is unremarkable. The small bowel loops appear within normal limits. The appendix is visualized and appears normal. No pathologic dilatation of the colon. Vascular/Lymphatic: Aortic atherosclerosis. No enlarged abdominal or pelvic lymph nodes. Reproductive: Uterus and bilateral adnexa are unremarkable. Other: No free fluid or fluid collections Musculoskeletal:  No acute or significant osseous findings. Scoliosis and degenerative disc disease. No suspicious bone lesions identified. IMPRESSION: 1. No acute findings identified within the abdomen or pelvis. 2. No findings to suggest solid organ or nodal metastasis within the  abdomen or pelvis. 3. Nonobstructing right renal calculi. 4. Small left pleural effusion. Electronically Signed   By: Kerby Moors M.D.   On: 03/17/2019 13:11   DG Chest 1 View  Result Date: 03/04/2019 CLINICAL DATA:  LEFT pleural effusion post thoracentesis EXAM: CHEST  1 VIEW COMPARISON:  03/02/2019 FINDINGS: Enlargement of cardiac silhouette with pulmonary vascular congestion. Scattered interstitial infiltrates favor pulmonary edema. Small loculated pleural effusion at lateral LEFT lung base decreased from prior study. No pneumothorax following thoracentesis. Atherosclerotic calcification aorta. Bones demineralized. IMPRESSION: No pneumothorax following LEFT thoracentesis. Enlargement of cardiac silhouette with vascular congestion and mild pulmonary edema, slightly improved from previous study. Electronically Signed   By: Lavonia Dana M.D.   On: 03/04/2019 14:45   cxr  Result Date: 03/07/2019 CLINICAL DATA:  Status post thoracentesis. EXAM: CHEST - 2 VIEW COMPARISON:  03/04/2019. FINDINGS: Normal heart size. Aortic atherosclerosis. There is been interval decrease in volume of the left pleural effusion status post thoracentesis. No appreciable pneumothorax identified. Mild diffuse increase interstitial markings are identified, unchanged. IMPRESSION: Mild diffuse edema, similar to previous exam. Decrease in volume of left pleural effusion status post thoracentesis. Electronically Signed   By: Kerby Moors M.D.   On: 03/07/2019 13:38   CT Angio Chest PE W and/or Wo Contrast  Result Date: 03/04/2019 CLINICAL DATA:  PE suspected, high pretest probability. Shortness of breath. Atrial fibrillation. Patient discharged from [facility] yesterday for treatment of congestive heart failure. EXAM: CT ANGIOGRAPHY CHEST WITH CONTRAST TECHNIQUE: Multidetector CT imaging of the chest was performed using the standard protocol during bolus administration of intravenous contrast. Multiplanar CT image  reconstructions and MIPs were obtained to evaluate the vascular anatomy. CONTRAST:  162mL OMNIPAQUE IOHEXOL 350 MG/ML SOLN COMPARISON:  One-view chest x-ray 03/02/2019. CTA chest 08/01/2017 FINDINGS: Cardiovascular: The heart is mildly enlarged. Mild atherosclerotic changes are noted in the aorta without aneurysm. Great vessel origins are within normal limits. Pulmonary artery opacification is excellent. Main pulmonary artery is mildly enlarged, measuring 3.7 cm. No focal filling defects are present to suggest pulmonary emboli. Branch vessels are within normal limits. Mediastinum/Nodes: No significant mediastinal, hilar, or axillary adenopathy is present. Lungs/Pleura: Extensive pleural thickening is present throughout the right lung. Pleural nodular disease is present in the right lower lobe with soft tissue adjacent to the superior segment measuring up to 1.5 cm. Patchy nodular densities are present in the upper lobes bilaterally. 8 mm focal nodule in the right upper lobe is pleural based and stable. Bilateral layering effusions are present, left greater than right. There is partial collapse of the lower lobes bilaterally, left greater than right. Upper Abdomen: No acute abnormality. Musculoskeletal: No chest wall abnormality. No acute or significant osseous findings. Review of the MIP images confirms the above findings. IMPRESSION: 1. No pulmonary embolus. 2. Mild enlargement of the main pulmonary artery suggesting pulmonary arterial hypertension. 3. Extensive pleural thickening throughout the right lung. This is new from the prior exam. It may be related to the acute effusions and reason pulmonary disease. Recommend follow-up CT of the chest with contrast after treatment of acute disease and volume overload. 4. Bilateral pleural effusions, left greater than right. 5. Bilateral lower lobe collapse, left greater than right. 6. Aortic Atherosclerosis (ICD10-I70.0). 7. Electronically Signed  By: San Morelle M.D.   On: 03/04/2019 05:30   DG Chest Portable 1 View  Result Date: 03/13/2019 CLINICAL DATA:  Shortness of breath EXAM: PORTABLE CHEST 1 VIEW COMPARISON:  03/07/2019 FINDINGS: Stable cardiomegaly. Calcified aortic knob. Mildly prominent interstitial markings are similar to prior. No new focal airspace consolidation. No significant pleural fluid collection. No pneumothorax. IMPRESSION: Cardiomegaly with persistent increased interstitial markings, which could reflect a component of edema. No new focal airspace consolidation. Electronically Signed   By: Davina Poke M.D.   On: 03/13/2019 22:18   XR Chest Portable  Result Date: 03/02/2019 CLINICAL DATA:  AFib, shortness of breath EXAM: PORTABLE CHEST 1 VIEW COMPARISON:  January 31, 2019 FINDINGS: There is cardiomegaly. Increased interstitial markings are now seen throughout both lungs. There is blunting of the bilateral costophrenic angles which could be due to trace bilateral pleural effusions. No acute osseous abnormality. IMPRESSION: Cardiomegaly and interstitial edema Probable trace bilateral pleural effusions Electronically Signed   By: Prudencio Pair M.D.   On: 03/02/2019 03:31   US THORACENTESIS ASP PLEURAL SPACE W/IMG GUIDE  Addendum Date: 03/04/2019   ADDENDUM REPORT: 03/04/2019 15:07 ADDENDUM: Error in initial report. Removed LEFT pleural fluid was YELLOW, not serosanguinous. Remainder of report correct as dictated. Electronically Signed   By: Lavonia Dana M.D.   On: 03/04/2019 15:07   Result Date: 03/04/2019 INDICATION: LEFT pleural effusion EXAM: ULTRASOUND GUIDED DIAGNOSTIC LEFT THORACENTESIS MEDICATIONS: None COMPLICATIONS: None immediate PROCEDURE: An ultrasound guided thoracentesis was thoroughly discussed with the patient and questions answered. The benefits, risks, alternatives and complications were also discussed. The patient understands and wishes to proceed with the procedure. Written consent was obtained. Ultrasound was  performed to localize and mark an adequate pocket of fluid in the LEFT chest. The area was then prepped and draped in the normal sterile fashion. 1% Lidocaine was used for local anesthesia. Under ultrasound guidance a 8 French thoracentesis catheter was introduced. Thoracentesis was performed. The catheter was removed and a dressing applied. FINDINGS: A total of approximately 230 mL of serosanguineous LEFT pleural fluid was removed. Septations are seen within the effusion, appeared multiloculated. Samples were sent to the laboratory as requested by the clinical team. IMPRESSION: Successful ultrasound guided LEFT thoracentesis yielding 230 mL of pleural fluid. Electronically Signed: By: Lavonia Dana M.D. On: 03/04/2019 14:48        Discharge Exam: Vitals:   03/17/19 2216 03/18/19 0600  BP: 121/72 116/63  Pulse: 76 71  Resp: 20 18  Temp: 98.6 F (37 C) 98.1 F (36.7 C)  SpO2: 98% 96%   Vitals:   03/17/19 1046 03/17/19 1318 03/17/19 2216 03/18/19 0600  BP:  128/82 121/72 116/63  Pulse:  92 76 71  Resp:  18 20 18   Temp:  98 F (36.7 C) 98.6 F (37 C) 98.1 F (36.7 C)  TempSrc:  Oral Oral Oral  SpO2:  100% 98% 96%  Weight: 51.2 kg     Height:        General: Pt is alert, awake, not in acute distress Cardiovascular: IRRR, S1/S2 +, no rubs, no gallops Respiratory: CTA bilaterally, no wheezing, no rhonchi Abdominal: Soft, NT, ND, bowel sounds + Extremities: no edema, no cyanosis   The results of significant diagnostics from this hospitalization (including imaging, microbiology, ancillary and laboratory) are listed below for reference.    Significant Diagnostic Studies: CT ABDOMEN PELVIS WO CONTRAST  Result Date: 03/17/2019 CLINICAL DATA:  Loss of weight. History of squamous cell carcinoma  of the anal canal and vagina. There is also a history of endometrial carcinoma. EXAM: CT ABDOMEN AND PELVIS WITHOUT CONTRAST TECHNIQUE: Multidetector CT imaging of the abdomen and pelvis was  performed following the standard protocol without IV contrast. COMPARISON:  07/11/17 FINDINGS: Lower chest: Small left pleural effusion identified. Similar appearance of diffuse thickening of the peribronchovascular interstitium. No airspace consolidation. Hepatobiliary: No focal liver abnormality. Gallbladder appears normal. No bile duct dilatation. Pancreas: Unremarkable. No pancreatic ductal dilatation or surrounding inflammatory changes. Spleen: Normal in size without focal abnormality. Adrenals/Urinary Tract: Normal adrenal glands. Bilateral renal cortical thinning. 2 mm stone noted within inferior pole of right kidney, image 35/2. Lateral cortex of upper pole of right kidney cysts measures 2.3 cm. No hydronephrosis identified bilaterally. No hydroureter or ureteral calculi. Bladder appears normal. Stomach/Bowel: The stomach is unremarkable. The small bowel loops appear within normal limits. The appendix is visualized and appears normal. No pathologic dilatation of the colon. Vascular/Lymphatic: Aortic atherosclerosis. No enlarged abdominal or pelvic lymph nodes. Reproductive: Uterus and bilateral adnexa are unremarkable. Other: No free fluid or fluid collections Musculoskeletal: No acute or significant osseous findings. Scoliosis and degenerative disc disease. No suspicious bone lesions identified. IMPRESSION: 1. No acute findings identified within the abdomen or pelvis. 2. No findings to suggest solid organ or nodal metastasis within the abdomen or pelvis. 3. Nonobstructing right renal calculi. 4. Small left pleural effusion. Electronically Signed   By: Kerby Moors M.D.   On: 03/17/2019 13:11   DG Chest 1 View  Result Date: 03/04/2019 CLINICAL DATA:  LEFT pleural effusion post thoracentesis EXAM: CHEST  1 VIEW COMPARISON:  03/02/2019 FINDINGS: Enlargement of cardiac silhouette with pulmonary vascular congestion. Scattered interstitial infiltrates favor pulmonary edema. Small loculated pleural effusion  at lateral LEFT lung base decreased from prior study. No pneumothorax following thoracentesis. Atherosclerotic calcification aorta. Bones demineralized. IMPRESSION: No pneumothorax following LEFT thoracentesis. Enlargement of cardiac silhouette with vascular congestion and mild pulmonary edema, slightly improved from previous study. Electronically Signed   By: Lavonia Dana M.D.   On: 03/04/2019 14:45   cxr  Result Date: 03/07/2019 CLINICAL DATA:  Status post thoracentesis. EXAM: CHEST - 2 VIEW COMPARISON:  03/04/2019. FINDINGS: Normal heart size. Aortic atherosclerosis. There is been interval decrease in volume of the left pleural effusion status post thoracentesis. No appreciable pneumothorax identified. Mild diffuse increase interstitial markings are identified, unchanged. IMPRESSION: Mild diffuse edema, similar to previous exam. Decrease in volume of left pleural effusion status post thoracentesis. Electronically Signed   By: Kerby Moors M.D.   On: 03/07/2019 13:38   CT Angio Chest PE W and/or Wo Contrast  Result Date: 03/04/2019 CLINICAL DATA:  PE suspected, high pretest probability. Shortness of breath. Atrial fibrillation. Patient discharged from [facility] yesterday for treatment of congestive heart failure. EXAM: CT ANGIOGRAPHY CHEST WITH CONTRAST TECHNIQUE: Multidetector CT imaging of the chest was performed using the standard protocol during bolus administration of intravenous contrast. Multiplanar CT image reconstructions and MIPs were obtained to evaluate the vascular anatomy. CONTRAST:  173mL OMNIPAQUE IOHEXOL 350 MG/ML SOLN COMPARISON:  One-view chest x-ray 03/02/2019. CTA chest 08/01/2017 FINDINGS: Cardiovascular: The heart is mildly enlarged. Mild atherosclerotic changes are noted in the aorta without aneurysm. Great vessel origins are within normal limits. Pulmonary artery opacification is excellent. Main pulmonary artery is mildly enlarged, measuring 3.7 cm. No focal filling  defects are present to suggest pulmonary emboli. Branch vessels are within normal limits. Mediastinum/Nodes: No significant mediastinal, hilar, or axillary adenopathy is present.  Lungs/Pleura: Extensive pleural thickening is present throughout the right lung. Pleural nodular disease is present in the right lower lobe with soft tissue adjacent to the superior segment measuring up to 1.5 cm. Patchy nodular densities are present in the upper lobes bilaterally. 8 mm focal nodule in the right upper lobe is pleural based and stable. Bilateral layering effusions are present, left greater than right. There is partial collapse of the lower lobes bilaterally, left greater than right. Upper Abdomen: No acute abnormality. Musculoskeletal: No chest wall abnormality. No acute or significant osseous findings. Review of the MIP images confirms the above findings. IMPRESSION: 1. No pulmonary embolus. 2. Mild enlargement of the main pulmonary artery suggesting pulmonary arterial hypertension. 3. Extensive pleural thickening throughout the right lung. This is new from the prior exam. It may be related to the acute effusions and reason pulmonary disease. Recommend follow-up CT of the chest with contrast after treatment of acute disease and volume overload. 4. Bilateral pleural effusions, left greater than right. 5. Bilateral lower lobe collapse, left greater than right. 6. Aortic Atherosclerosis (ICD10-I70.0). 7. Electronically Signed   By: San Morelle M.D.   On: 03/04/2019 05:30   DG Chest Portable 1 View  Result Date: 03/13/2019 CLINICAL DATA:  Shortness of breath EXAM: PORTABLE CHEST 1 VIEW COMPARISON:  03/07/2019 FINDINGS: Stable cardiomegaly. Calcified aortic knob. Mildly prominent interstitial markings are similar to prior. No new focal airspace consolidation. No significant pleural fluid collection. No pneumothorax. IMPRESSION: Cardiomegaly with persistent increased interstitial markings, which could reflect a  component of edema. No new focal airspace consolidation. Electronically Signed   By: Davina Poke M.D.   On: 03/13/2019 22:18   XR Chest Portable  Result Date: 03/02/2019 CLINICAL DATA:  AFib, shortness of breath EXAM: PORTABLE CHEST 1 VIEW COMPARISON:  January 31, 2019 FINDINGS: There is cardiomegaly. Increased interstitial markings are now seen throughout both lungs. There is blunting of the bilateral costophrenic angles which could be due to trace bilateral pleural effusions. No acute osseous abnormality. IMPRESSION: Cardiomegaly and interstitial edema Probable trace bilateral pleural effusions Electronically Signed   By: Prudencio Pair M.D.   On: 03/02/2019 03:31   US THORACENTESIS ASP PLEURAL SPACE W/IMG GUIDE  Addendum Date: 03/04/2019   ADDENDUM REPORT: 03/04/2019 15:07 ADDENDUM: Error in initial report. Removed LEFT pleural fluid was YELLOW, not serosanguinous. Remainder of report correct as dictated. Electronically Signed   By: Lavonia Dana M.D.   On: 03/04/2019 15:07   Result Date: 03/04/2019 INDICATION: LEFT pleural effusion EXAM: ULTRASOUND GUIDED DIAGNOSTIC LEFT THORACENTESIS MEDICATIONS: None COMPLICATIONS: None immediate PROCEDURE: An ultrasound guided thoracentesis was thoroughly discussed with the patient and questions answered. The benefits, risks, alternatives and complications were also discussed. The patient understands and wishes to proceed with the procedure. Written consent was obtained. Ultrasound was performed to localize and mark an adequate pocket of fluid in the LEFT chest. The area was then prepped and draped in the normal sterile fashion. 1% Lidocaine was used for local anesthesia. Under ultrasound guidance a 8 French thoracentesis catheter was introduced. Thoracentesis was performed. The catheter was removed and a dressing applied. FINDINGS: A total of approximately 230 mL of serosanguineous LEFT pleural fluid was removed. Septations are seen within the effusion, appeared  multiloculated. Samples were sent to the laboratory as requested by the clinical team. IMPRESSION: Successful ultrasound guided LEFT thoracentesis yielding 230 mL of pleural fluid. Electronically Signed: By: Lavonia Dana M.D. On: 03/04/2019 14:48     Microbiology: Recent Results (from the  past 240 hour(s))  SARS CORONAVIRUS 2 (Artyom Stencel 6-24 HRS) Nasopharyngeal Nasopharyngeal Swab     Status: None   Collection Time: 03/13/19  9:58 PM   Specimen: Nasopharyngeal Swab  Result Value Ref Range Status   SARS Coronavirus 2 NEGATIVE NEGATIVE Final    Comment: (NOTE) SARS-CoV-2 target nucleic acids are NOT DETECTED. The SARS-CoV-2 RNA is generally detectable in upper and lower respiratory specimens during the acute phase of infection. Negative results do not preclude SARS-CoV-2 infection, do not rule out co-infections with other pathogens, and should not be used as the sole basis for treatment or other patient management decisions. Negative results must be combined with clinical observations, patient history, and epidemiological information. The expected result is Negative. Fact Sheet for Patients: SugarRoll.be Fact Sheet for Healthcare Providers: https://www.woods-mathews.com/ This test is not yet approved or cleared by the [place] FDA and  has been authorized for detection and/or diagnosis of SARS-CoV-2 by FDA under an Emergency Use Authorization (EUA). This EUA will remain  in effect (meaning this test can be used) for the duration of the COVID-19 declaration under Section 56 4(b)(1) of the Act, 21 U.S.C. section 360bbb-3(b)(1), unless the authorization is terminated or revoked sooner. Performed at [facility], [place] [address], [place], [place] [address]   SARS CORONAVIRUS 2 (Kev[place]ay Burks 6-24 HRS) Nasopharyngeal Nasopharyngeal Swab     Status: None   Collection Time: 03/17/19 10:40 AM   Specimen: Nasopharyngeal Swab  Result Value Ref Range Status     SARS Coronavirus 2 NEGATIVE NEGATIVE Final    Comment: (NOTE) SARS-CoV-2 target nucleic acids are NOT DETECTED. The SARS-CoV-2 RNA is generally detectable in upper and lower respiratory specimens during the acute phase of infection. Negative results do not preclude SARS-CoV-2 infection, do not rule out co-infections with other pathogens, and should not be used as the sole basis for treatment or other patient management decisions. Negative results must be combined with clinical observations, patient history, and epidemiological information. The expected result is Negative. Fact Sheet for Patients: SugarRoll.be Fact Sheet for Healthcare Providers: https://www.woods-mathews.com/ This test is not yet approved or cleared by the [place] FDA and  has been authorized for detection and/or diagnosis of SARS-CoV-2 by FDA under an Emergency Use Authorization (EUA). This EUA will remain  in effect (meaning this test can be used) for the duration of the COVID-19 declaration under Section 56 4(b)(1) of the Act, 21 U.S.C. section 360bbb-3(b)(1), unless the authorization is terminated or revoked sooner. Performed at [facility], [place] [address] [place], [place] [address]      Labs: Basic Metabolic Panel: Recent Labs  Lab 03/13/19 2137 03/14/19 0449 03/16/19 0421 03/17/19 0427 03/18/19 0507  NA 140 141 138 139 139  K 5.6* 4.1 3.7 3.1* 3.1*  CL 105 104 102 102 102  CO2 26 24 27 25 27   GLUCOSE 118* 100* 83 94 87  BUN 36* 30* 28* 24* 20  CREATININE 1.77* 1.48* 1.36* 1.00 0.97  CALCIUM 9.1 9.2 8.6* 8.9 9.0   Liver Function Tests: Recent Labs  Lab 03/14/19 0449  AST 13*  ALT 13  ALKPHOS 64  BILITOT 0.5  PROT 7.3  ALBUMIN 3.5   No results for input(s): LIPASE, AMYLASE in the last 168 hours. No results for input(s): AMMONIA in the last 168 hours. CBC: Recent Labs  Lab 03/13/19 2137 03/14/19 0449  WBC 9.2 7.3  HGB 9.9* 9.8*   HCT 33.1* 32.1*  MCV 94.8 93.6  PLT 328 326  Cardiac Enzymes: No results for input(s): CKTOTAL, CKMB, CKMBINDEX, TROPONINI in the last 168 hours. BNP: Invalid input(s): POCBNP CBG: No results for input(s): GLUCAP in the last 168 hours.  Time coordinating discharge:  36 minutes  Signed:  Orson Eva, DO [facility] Pager: [phone] 03/18/2019, 10:54 AM

## 2019-03-18 NOTE — TOC Transition Note (Addendum)
Transition of Care [facility]) - CM/SW Discharge Note   Patient Details  Name: Renee Blake MRN: [id number] Date of Birth: Aug 03, 1928  Transition [facility]) CM/SW Contact:  Ihor Gully, LCSW Phone Number: 03/18/2019, 12:38 PM   Clinical Narrative:    Discharge clinicals sent to facility. Mardene Celeste at [facility] notified of discharge.  RN to call report.  EMS transport called by RN.     Barriers to Discharge: SNF Pending bed offer, Continued Medical Work up   Patient Goals and CMS Choice Patient states their goals for this hospitalization and ongoing recovery are:: daughter wishes for patient to go to rehab and then return home CMS Medicare.gov Compare Post Acute Care list provided to:: Other (Comment Required)(Dtr-[name]) Choice offered to / list presented to : Adult Children  Discharge Placement              Patient chooses bed at: [facility]) Patient to be transferred to facility by: [place] Name of family member notified: Janace Hoard, dtr. Patient and family notified of of transfer: 03/18/19  Discharge Plan and Services   Discharge Planning Services: CM Consult Post Acute Care Choice: [place]                               Social Determinants of Health (SDOH) Interventions     Readmission Risk Interventions Readmission Risk Prevention Plan 03/15/2019 03/02/2019 02/06/2019  Transportation Screening Complete Complete Complete  PCP or Specialist Appt within 5-7 Days - - Complete  PCP or Specialist Appt within 3-5 Days - Complete -  Home Care Screening - - Complete  Medication Review (RN CM) - - Complete  HRI or Home Care Consult Complete Complete -  Social Work Consult for [place] Planning/Counseling Complete Complete -  Palliative Care Screening Not Applicable Complete -  Medication Review Press photographer) Complete Complete -  Some recent data might be hidden

## 2019-03-19 ENCOUNTER — Telehealth (INDEPENDENT_AMBULATORY_CARE_PROVIDER_SITE_OTHER): Payer: Self-pay | Admitting: Internal Medicine

## 2019-03-19 ENCOUNTER — Telehealth: Payer: Self-pay | Admitting: *Deleted

## 2019-03-19 DIAGNOSIS — I480 Paroxysmal atrial fibrillation: Secondary | ICD-10-CM | POA: Diagnosis not present

## 2019-03-19 DIAGNOSIS — I5033 Acute on chronic diastolic (congestive) heart failure: Secondary | ICD-10-CM | POA: Diagnosis not present

## 2019-03-19 DIAGNOSIS — I5022 Chronic systolic (congestive) heart failure: Secondary | ICD-10-CM | POA: Diagnosis not present

## 2019-03-19 DIAGNOSIS — N1831 Chronic kidney disease, stage 3a: Secondary | ICD-10-CM | POA: Diagnosis not present

## 2019-03-19 DIAGNOSIS — J9601 Acute respiratory failure with hypoxia: Secondary | ICD-10-CM | POA: Diagnosis not present

## 2019-03-19 DIAGNOSIS — M6281 Muscle weakness (generalized): Secondary | ICD-10-CM | POA: Diagnosis not present

## 2019-03-19 DIAGNOSIS — I4821 Permanent atrial fibrillation: Secondary | ICD-10-CM | POA: Diagnosis not present

## 2019-03-19 DIAGNOSIS — R2689 Other abnormalities of gait and mobility: Secondary | ICD-10-CM | POA: Diagnosis not present

## 2019-03-19 DIAGNOSIS — N1832 Chronic kidney disease, stage 3b: Secondary | ICD-10-CM | POA: Diagnosis not present

## 2019-03-19 DIAGNOSIS — R627 Adult failure to thrive: Secondary | ICD-10-CM | POA: Diagnosis not present

## 2019-03-19 LAB — BASIC METABOLIC PANEL
Anion gap: 9 (ref 5–15)
BUN: 19 mg/dL (ref 8–23)
CO2: 25 mmol/L (ref 22–32)
Calcium: 9.1 mg/dL (ref 8.9–10.3)
Chloride: 104 mmol/L (ref 98–111)
Creatinine, Ser: 1.12 mg/dL — ABNORMAL HIGH (ref 0.44–1.00)
GFR calc Af Amer: 51 mL/min — ABNORMAL LOW (ref >60)
GFR calc non Af Amer: 44 mL/min — ABNORMAL LOW (ref >60)
Glucose, Bld: 91 mg/dL (ref 70–99)
Potassium: 3.6 mmol/L (ref 3.5–5.1)
Sodium: 138 mmol/L (ref 135–145)

## 2019-03-19 MED ORDER — CYANOCOBALAMIN 500 MCG PO TABS: 500.0000 ug | ORAL_TABLET | Freq: Every day | ORAL | Status: DC

## 2019-03-19 MED ORDER — VITAMIN B-12 100 MCG PO TABS: 500.0000 ug | ORAL_TABLET | Freq: Every day | ORAL | Status: DC

## 2019-03-19 NOTE — Progress Notes (Signed)
Patient to discharge to [name]. EMS here to get patient for transport, since patient is Ambulatory with assistance she is not a candidate for EMS transport. Called patient's daughter Renee Blake who is unable to pick up patient at this time of night. Mid-level called and notified. Will hold patient tonight and Renee Blake will be here 12/18 to transport patient to [place]. Explained to patient who verbalized understanding.

## 2019-03-19 NOTE — Telephone Encounter (Signed)
Patient verbally consented for tele-health visits with [facility] and understands that her insurance company will be billed for the encounter.  Aware to have vitals available   

## 2019-03-19 NOTE — Telephone Encounter (Signed)
I called and talked with the patient;s nurse and granddaughter, Herschel Senegal. Her grandmothers PCP is Consulting civil engineer. Patient has a history of Anal Canal Cancer , GI Bleed , Polyps. They are wanting the patient to have a consult to be GI established , and to also see if the patient may have a TCS.  The nurse was advised that this would be addressed with Dr.Rehman and it would be Monday before they may hear something.

## 2019-03-19 NOTE — Telephone Encounter (Signed)
Denzil Hughes from [facility] called stated patient will be arriving at their facility today and she needs to schedule a consult with Dr Laural Golden - please advise facility ph# [phone] ext [phone]

## 2019-03-20 DIAGNOSIS — I480 Paroxysmal atrial fibrillation: Secondary | ICD-10-CM | POA: Diagnosis not present

## 2019-03-20 DIAGNOSIS — J9601 Acute respiratory failure with hypoxia: Secondary | ICD-10-CM | POA: Diagnosis not present

## 2019-03-20 DIAGNOSIS — I5022 Chronic systolic (congestive) heart failure: Secondary | ICD-10-CM | POA: Diagnosis not present

## 2019-03-20 DIAGNOSIS — N1831 Chronic kidney disease, stage 3a: Secondary | ICD-10-CM | POA: Diagnosis not present

## 2019-03-24 NOTE — Telephone Encounter (Signed)
Dr.Rehman states that the patient has been seen by him as consult at [facility].

## 2019-04-03 ENCOUNTER — Telehealth: Payer: Self-pay | Admitting: Medical

## 2019-04-03 MED ORDER — DILTIAZEM HCL ER 90 MG PO CP12: 90.0000 mg | ORAL_CAPSULE | Freq: Two times a day (BID) | ORAL | 6 refills | Status: DC

## 2019-04-03 MED ORDER — DILTIAZEM HCL ER 90 MG PO CP12: 90.0000 mg | ORAL_CAPSULE | Freq: Two times a day (BID) | ORAL | 0 refills | Status: DC

## 2019-04-03 NOTE — Telephone Encounter (Signed)
.    Patient's daughter, [name], called the after hours line to report her mother was recently discharged from a Rehab facility following a recent admission to the hospital. She reported prior intolerance to diltiazem 180mg  daily and states her mother was discharged home on diltiazem 90mg  BID, however was not given a prescription for this new medication. Rx was sent to [facility] for a 30 day supply, as well as her preferred pharmacy, [facility] (was not in stock and would need to be ordered) for refills going forward. Daughter was appreciative of the call.  Abigail Butts, PA-C 04/03/19; 2:12 PM

## 2019-04-05 ENCOUNTER — Telehealth: Payer: Self-pay | Admitting: Cardiovascular Disease

## 2019-04-05 DIAGNOSIS — Z743 Need for continuous supervision: Secondary | ICD-10-CM | POA: Diagnosis not present

## 2019-04-05 DIAGNOSIS — R531 Weakness: Secondary | ICD-10-CM | POA: Diagnosis not present

## 2019-04-05 DIAGNOSIS — R7989 Other specified abnormal findings of blood chemistry: Secondary | ICD-10-CM | POA: Diagnosis not present

## 2019-04-05 DIAGNOSIS — I5032 Chronic diastolic (congestive) heart failure: Secondary | ICD-10-CM | POA: Diagnosis not present

## 2019-04-05 DIAGNOSIS — J189 Pneumonia, unspecified organism: Secondary | ICD-10-CM | POA: Diagnosis not present

## 2019-04-05 DIAGNOSIS — J690 Pneumonitis due to inhalation of food and vomit: Secondary | ICD-10-CM | POA: Diagnosis not present

## 2019-04-05 DIAGNOSIS — I509 Heart failure, unspecified: Secondary | ICD-10-CM | POA: Diagnosis not present

## 2019-04-05 DIAGNOSIS — R0602 Shortness of breath: Secondary | ICD-10-CM | POA: Diagnosis not present

## 2019-04-05 DIAGNOSIS — Z85828 Personal history of other malignant neoplasm of skin: Secondary | ICD-10-CM | POA: Diagnosis not present

## 2019-04-05 DIAGNOSIS — E039 Hypothyroidism, unspecified: Secondary | ICD-10-CM | POA: Diagnosis not present

## 2019-04-05 DIAGNOSIS — D649 Anemia, unspecified: Secondary | ICD-10-CM | POA: Diagnosis not present

## 2019-04-05 DIAGNOSIS — K219 Gastro-esophageal reflux disease without esophagitis: Secondary | ICD-10-CM | POA: Diagnosis not present

## 2019-04-05 DIAGNOSIS — R0902 Hypoxemia: Secondary | ICD-10-CM | POA: Diagnosis not present

## 2019-04-05 DIAGNOSIS — I4891 Unspecified atrial fibrillation: Secondary | ICD-10-CM | POA: Diagnosis not present

## 2019-04-05 DIAGNOSIS — Z8711 Personal history of peptic ulcer disease: Secondary | ICD-10-CM | POA: Diagnosis not present

## 2019-04-05 DIAGNOSIS — I5040 Unspecified combined systolic (congestive) and diastolic (congestive) heart failure: Secondary | ICD-10-CM | POA: Diagnosis not present

## 2019-04-05 DIAGNOSIS — Z20822 Contact with and (suspected) exposure to covid-19: Secondary | ICD-10-CM | POA: Diagnosis not present

## 2019-04-05 DIAGNOSIS — E876 Hypokalemia: Secondary | ICD-10-CM | POA: Diagnosis not present

## 2019-04-05 DIAGNOSIS — I110 Hypertensive heart disease with heart failure: Secondary | ICD-10-CM | POA: Diagnosis not present

## 2019-04-05 NOTE — Telephone Encounter (Signed)
Dr Bronson Ing is out of the office today. The discussion and request from the family to have patient transferred to a [facility] facility would need to be between family and the hospitalist that is taking care of the patient. We do not have any priveliges at [facility] to facilitate a transfer, the decision would be between the family and the treating medical team there at [facility]. She should speak directly to the hospitalist.    Carlyle Dolly MD

## 2019-04-05 NOTE — Telephone Encounter (Signed)
Pt daughter voiced understanding  

## 2019-04-05 NOTE — Telephone Encounter (Signed)
Patient's daughter has called back again - upset that she can not get her mother transferred from [facility] to a [place]. She can not get [facility] to communicate with her to let her know what is going on with her mother or to get her transferred.  Asking that someone call her back please  [phone]

## 2019-04-05 NOTE — Telephone Encounter (Signed)
Pt daughter says pt is currently admitted at [facility] for pneumonia/Afib this morning and wanted pt to be transferred to [facility] but says ambulance wouldn't take her there this morning - pt aware that she could certainly ask to have pt transferred to [facility] depending on how stable pt is - pt daughter is requested that Dr Bronson Ing call her (explained not likely he would suggest anything different but would forward message as requested) [phone]

## 2019-04-05 NOTE — Telephone Encounter (Signed)
Patient is in the hospital at [facility].  They want her transferred to a [facility] but was told that the hospitals are full and not accepting patients

## 2019-04-06 ENCOUNTER — Telehealth: Payer: Self-pay | Admitting: Cardiovascular Disease

## 2019-04-06 DIAGNOSIS — R0902 Hypoxemia: Secondary | ICD-10-CM | POA: Diagnosis not present

## 2019-04-06 DIAGNOSIS — E876 Hypokalemia: Secondary | ICD-10-CM | POA: Diagnosis not present

## 2019-04-06 DIAGNOSIS — E039 Hypothyroidism, unspecified: Secondary | ICD-10-CM | POA: Diagnosis not present

## 2019-04-06 DIAGNOSIS — I509 Heart failure, unspecified: Secondary | ICD-10-CM | POA: Diagnosis not present

## 2019-04-06 NOTE — Telephone Encounter (Signed)
Calling asking for someone to call her back today about her mother

## 2019-04-06 NOTE — Telephone Encounter (Signed)
Spoke with daughter [name]) - explained to her that our doctors here have no privileges at [facility].  Would suggest that she contact the charge nurse there to discuss her situation & that you would like to have her moved to [facility].  She verbalized understanding.

## 2019-04-07 DIAGNOSIS — R0902 Hypoxemia: Secondary | ICD-10-CM | POA: Diagnosis not present

## 2019-04-07 DIAGNOSIS — D649 Anemia, unspecified: Secondary | ICD-10-CM | POA: Diagnosis not present

## 2019-04-07 DIAGNOSIS — E039 Hypothyroidism, unspecified: Secondary | ICD-10-CM | POA: Diagnosis not present

## 2019-04-07 DIAGNOSIS — J189 Pneumonia, unspecified organism: Secondary | ICD-10-CM | POA: Diagnosis not present

## 2019-04-07 DIAGNOSIS — E876 Hypokalemia: Secondary | ICD-10-CM | POA: Diagnosis not present

## 2019-04-07 DIAGNOSIS — I509 Heart failure, unspecified: Secondary | ICD-10-CM | POA: Diagnosis not present

## 2019-04-08 ENCOUNTER — Telehealth: Payer: Self-pay | Admitting: Physician Assistant

## 2019-04-08 ENCOUNTER — Telehealth: Payer: Self-pay | Admitting: Cardiovascular Disease

## 2019-04-08 DIAGNOSIS — I509 Heart failure, unspecified: Secondary | ICD-10-CM | POA: Diagnosis not present

## 2019-04-08 DIAGNOSIS — R0902 Hypoxemia: Secondary | ICD-10-CM | POA: Diagnosis not present

## 2019-04-08 DIAGNOSIS — E039 Hypothyroidism, unspecified: Secondary | ICD-10-CM | POA: Diagnosis not present

## 2019-04-08 DIAGNOSIS — D649 Anemia, unspecified: Secondary | ICD-10-CM | POA: Diagnosis not present

## 2019-04-08 NOTE — Telephone Encounter (Signed)
Pt daughter called because her mother is not doing well.  She is in the hospital at [facility], for CHF. She went there on 01/02 for SOB. She has Afib and CHF.   Her weight is down, she is very weak. Has diarrhea from ABX.   Daughter is very concerned.   Feels she would be better off at [facility], where her doctors are.  Numerous phone notes regarding this.  Dtr aware that we do not have privileges there.   Per dtr, MD told pt's RN that he would not transfer her.   I explained that pt may be too unstable to transfer.  No guarantee that she will get better care at [facility].   Suggested that she tell the nursing staff that she is very worried about her mother's condition and needs to get an update from the MD.  Rosaria Ferries, PA-C 04/08/2019 7:45 AM Beeper [phone]

## 2019-04-08 NOTE — Telephone Encounter (Signed)
Dr. Sherryle Lis - hospitalists @ [facility] would like someone to please give him a call @ [phone]

## 2019-04-08 NOTE — Telephone Encounter (Signed)
Called and went straight to vm.

## 2019-04-11 DIAGNOSIS — E876 Hypokalemia: Secondary | ICD-10-CM | POA: Diagnosis not present

## 2019-04-11 DIAGNOSIS — E039 Hypothyroidism, unspecified: Secondary | ICD-10-CM | POA: Diagnosis not present

## 2019-04-11 DIAGNOSIS — Z9981 Dependence on supplemental oxygen: Secondary | ICD-10-CM | POA: Diagnosis not present

## 2019-04-11 DIAGNOSIS — R42 Dizziness and giddiness: Secondary | ICD-10-CM | POA: Diagnosis not present

## 2019-04-11 DIAGNOSIS — I4891 Unspecified atrial fibrillation: Secondary | ICD-10-CM | POA: Diagnosis not present

## 2019-04-11 DIAGNOSIS — K219 Gastro-esophageal reflux disease without esophagitis: Secondary | ICD-10-CM | POA: Diagnosis not present

## 2019-04-11 DIAGNOSIS — D649 Anemia, unspecified: Secondary | ICD-10-CM | POA: Diagnosis not present

## 2019-04-11 DIAGNOSIS — Z9181 History of falling: Secondary | ICD-10-CM | POA: Diagnosis not present

## 2019-04-11 DIAGNOSIS — I130 Hypertensive heart and chronic kidney disease with heart failure and stage 1 through stage 4 chronic kidney disease, or unspecified chronic kidney disease: Secondary | ICD-10-CM | POA: Diagnosis not present

## 2019-04-11 DIAGNOSIS — N189 Chronic kidney disease, unspecified: Secondary | ICD-10-CM | POA: Diagnosis not present

## 2019-04-11 DIAGNOSIS — I509 Heart failure, unspecified: Secondary | ICD-10-CM | POA: Diagnosis not present

## 2019-04-12 DIAGNOSIS — R42 Dizziness and giddiness: Secondary | ICD-10-CM | POA: Diagnosis not present

## 2019-04-12 DIAGNOSIS — I959 Hypotension, unspecified: Secondary | ICD-10-CM | POA: Diagnosis not present

## 2019-04-12 DIAGNOSIS — N189 Chronic kidney disease, unspecified: Secondary | ICD-10-CM | POA: Diagnosis not present

## 2019-04-12 DIAGNOSIS — Z9181 History of falling: Secondary | ICD-10-CM | POA: Diagnosis not present

## 2019-04-12 DIAGNOSIS — K219 Gastro-esophageal reflux disease without esophagitis: Secondary | ICD-10-CM | POA: Diagnosis not present

## 2019-04-12 DIAGNOSIS — E039 Hypothyroidism, unspecified: Secondary | ICD-10-CM | POA: Diagnosis not present

## 2019-04-12 DIAGNOSIS — I4891 Unspecified atrial fibrillation: Secondary | ICD-10-CM | POA: Diagnosis not present

## 2019-04-12 DIAGNOSIS — I509 Heart failure, unspecified: Secondary | ICD-10-CM | POA: Diagnosis not present

## 2019-04-12 DIAGNOSIS — E876 Hypokalemia: Secondary | ICD-10-CM | POA: Diagnosis not present

## 2019-04-12 DIAGNOSIS — Z9981 Dependence on supplemental oxygen: Secondary | ICD-10-CM | POA: Diagnosis not present

## 2019-04-12 DIAGNOSIS — J9621 Acute and chronic respiratory failure with hypoxia: Secondary | ICD-10-CM | POA: Diagnosis not present

## 2019-04-12 DIAGNOSIS — D649 Anemia, unspecified: Secondary | ICD-10-CM | POA: Diagnosis not present

## 2019-04-12 DIAGNOSIS — I5033 Acute on chronic diastolic (congestive) heart failure: Secondary | ICD-10-CM | POA: Diagnosis not present

## 2019-04-12 DIAGNOSIS — I130 Hypertensive heart and chronic kidney disease with heart failure and stage 1 through stage 4 chronic kidney disease, or unspecified chronic kidney disease: Secondary | ICD-10-CM | POA: Diagnosis not present

## 2019-04-13 DIAGNOSIS — R42 Dizziness and giddiness: Secondary | ICD-10-CM | POA: Diagnosis not present

## 2019-04-13 DIAGNOSIS — D649 Anemia, unspecified: Secondary | ICD-10-CM | POA: Diagnosis not present

## 2019-04-13 DIAGNOSIS — Z9981 Dependence on supplemental oxygen: Secondary | ICD-10-CM | POA: Diagnosis not present

## 2019-04-13 DIAGNOSIS — N189 Chronic kidney disease, unspecified: Secondary | ICD-10-CM | POA: Diagnosis not present

## 2019-04-13 DIAGNOSIS — I509 Heart failure, unspecified: Secondary | ICD-10-CM | POA: Diagnosis not present

## 2019-04-13 DIAGNOSIS — I130 Hypertensive heart and chronic kidney disease with heart failure and stage 1 through stage 4 chronic kidney disease, or unspecified chronic kidney disease: Secondary | ICD-10-CM | POA: Diagnosis not present

## 2019-04-13 DIAGNOSIS — K219 Gastro-esophageal reflux disease without esophagitis: Secondary | ICD-10-CM | POA: Diagnosis not present

## 2019-04-13 DIAGNOSIS — I4891 Unspecified atrial fibrillation: Secondary | ICD-10-CM | POA: Diagnosis not present

## 2019-04-13 DIAGNOSIS — E039 Hypothyroidism, unspecified: Secondary | ICD-10-CM | POA: Diagnosis not present

## 2019-04-13 DIAGNOSIS — E876 Hypokalemia: Secondary | ICD-10-CM | POA: Diagnosis not present

## 2019-04-13 DIAGNOSIS — Z9181 History of falling: Secondary | ICD-10-CM | POA: Diagnosis not present

## 2019-04-15 ENCOUNTER — Telehealth: Payer: Medicare Other | Admitting: Cardiovascular Disease

## 2019-04-15 DIAGNOSIS — E876 Hypokalemia: Secondary | ICD-10-CM | POA: Diagnosis not present

## 2019-04-15 DIAGNOSIS — D649 Anemia, unspecified: Secondary | ICD-10-CM | POA: Diagnosis not present

## 2019-04-15 DIAGNOSIS — I130 Hypertensive heart and chronic kidney disease with heart failure and stage 1 through stage 4 chronic kidney disease, or unspecified chronic kidney disease: Secondary | ICD-10-CM | POA: Diagnosis not present

## 2019-04-15 DIAGNOSIS — Z9181 History of falling: Secondary | ICD-10-CM | POA: Diagnosis not present

## 2019-04-15 DIAGNOSIS — K219 Gastro-esophageal reflux disease without esophagitis: Secondary | ICD-10-CM | POA: Diagnosis not present

## 2019-04-15 DIAGNOSIS — Z9981 Dependence on supplemental oxygen: Secondary | ICD-10-CM | POA: Diagnosis not present

## 2019-04-15 DIAGNOSIS — I509 Heart failure, unspecified: Secondary | ICD-10-CM | POA: Diagnosis not present

## 2019-04-15 DIAGNOSIS — I4891 Unspecified atrial fibrillation: Secondary | ICD-10-CM | POA: Diagnosis not present

## 2019-04-15 DIAGNOSIS — R42 Dizziness and giddiness: Secondary | ICD-10-CM | POA: Diagnosis not present

## 2019-04-15 DIAGNOSIS — E039 Hypothyroidism, unspecified: Secondary | ICD-10-CM | POA: Diagnosis not present

## 2019-04-15 DIAGNOSIS — N189 Chronic kidney disease, unspecified: Secondary | ICD-10-CM | POA: Diagnosis not present

## 2019-04-16 ENCOUNTER — Other Ambulatory Visit: Payer: Self-pay

## 2019-04-16 ENCOUNTER — Encounter: Payer: Self-pay | Admitting: Cardiovascular Disease

## 2019-04-16 ENCOUNTER — Encounter: Payer: Self-pay | Admitting: *Deleted

## 2019-04-16 ENCOUNTER — Ambulatory Visit: Payer: Medicare Other | Admitting: Cardiovascular Disease

## 2019-04-16 VITALS — BP 128/76 | HR 81 | Ht 64.0 in | Wt 114.0 lb

## 2019-04-16 DIAGNOSIS — D649 Anemia, unspecified: Secondary | ICD-10-CM | POA: Diagnosis not present

## 2019-04-16 DIAGNOSIS — I5032 Chronic diastolic (congestive) heart failure: Secondary | ICD-10-CM

## 2019-04-16 DIAGNOSIS — I4819 Other persistent atrial fibrillation: Secondary | ICD-10-CM

## 2019-04-16 DIAGNOSIS — I10 Essential (primary) hypertension: Secondary | ICD-10-CM

## 2019-04-16 MED ORDER — METOPROLOL SUCCINATE ER 50 MG PO TB24: 50.0000 mg | ORAL_TABLET | ORAL | 3 refills | Status: DC

## 2019-04-16 NOTE — Patient Instructions (Signed)
Your physician recommends that you schedule a follow-up appointment in: [place]  Your physician has recommended you make the following change in your medication:   STOP AMIODARONE  INCREASE TOPROL XL 50 MG DAILY IN THE MORNING  Thank you for choosing [facility]!!

## 2019-04-16 NOTE — Progress Notes (Signed)
SUBJECTIVE: The patient presents for posthospitalization follow-up.  She was apparently hospitalized at [facility] recently for CHF.  Her daughter called our office multiple times wanting to get the patient transferred to [facility].  She was hospitalized for acute on chronic diastolic heart failure in mid December at [facility].  I do not have a copy of records from [facility].  Her daughter, [name], is present with her.  She did bring in a brief discharge summary which did not include lab values.  There is mention of mild pneumonia seen on chest x-ray.  She was treated with both IV antibiotics and IV Lasix.  Another echocardiogram was apparently done which demonstrated normal LV systolic function, EF 60 to 123456, with diastolic dysfunction.  She wants to go for rehabilitation at the [facility].  Her appetite is improving.  She is using oxygen.  She denies leg swelling.  She has occasional palpitations.  She denies chest pain.   Review of Systems: As per "subjective", otherwise negative.  Allergies  Allergen Reactions  . Clonazepam Other (See Comments)    Pruritis  . Codeine Nausea And Vomiting and Other (See Comments)    Reaction unknown  . Sulfa Antibiotics Other (See Comments)    Reactions unknown    Current Outpatient Medications  Medication Sig Dispense Refill  . ALPRAZolam (XANAX) 0.25 MG tablet Take 1 tablet (0.25 mg total) by mouth 2 (two) times daily as needed. for anxiety 8 tablet 0  . amiodarone (PACERONE) 200 MG tablet Take 200 mg by mouth 2 (two) times daily.    Marland Kitchen dicyclomine (BENTYL) 10 MG capsule Take 10 mg by mouth every 4 (four) hours as needed for spasms.    Marland Kitchen diltiazem (CARDIZEM SR) 90 MG 12 hr capsule Take 1 capsule (90 mg total) by mouth 2 (two) times daily. 60 capsule 0  . furosemide (LASIX) 40 MG tablet Take 60 mg by mouth.    . levocetirizine (XYZAL) 5 MG tablet Take 5 mg by mouth every evening.    Marland Kitchen levothyroxine (SYNTHROID) 25 MCG  tablet Take 25 mcg by mouth daily before breakfast.    . meclizine (ANTIVERT) 25 MG tablet Take 25 mg by mouth daily as needed for dizziness.     . metoprolol succinate (TOPROL-XL) 25 MG 24 hr tablet Take 25 mg by mouth daily.    . Multiple Vitamin (MULTIVITAMIN WITH MINERALS) TABS tablet Take 1 tablet by mouth daily.    . ondansetron (ZOFRAN) 4 MG tablet Take 4 mg by mouth every 8 (eight) hours as needed for nausea or vomiting.    . pantoprazole (PROTONIX) 40 MG tablet Take 1 tablet (40 mg total) by mouth daily. 30 tablet 4  . vitamin B-12 (CYANOCOBALAMIN) 500 MCG tablet Take 1 tablet (500 mcg total) by mouth daily.     No current facility-administered medications for this visit.    Past Medical History:  Diagnosis Date  . Acute blood loss anemia   . Atrial fibrillation ([place])    a. diagnosed in 12/2018. Rate-control pursued given not a candidate for anticoagulation  . Chronic diarrhea   . Dilation of biliary tract   . Duodenal stricture   . Esophageal stricture   . Gastric ulcer   . GI bleed   . Hyperlipemia   . Hypertension   . Microscopic colitis   . Nephrolithiasis   . Pancreatic duct dilated   . Schatzki's ring   . Skin cancer   .  Sliding hiatal hernia   . Vertigo     Past Surgical History:  Procedure Laterality Date  . BIOPSY  07/28/2017   Procedure: BIOPSY;  Surgeon: Rogene Houston, MD;  Location: [facility] ENDO [facility];  Service: Endoscopy;;  rectum  . COLONOSCOPY N/A 07/28/2017   Procedure: COLONOSCOPY;  Surgeon: Rogene Houston, MD;  Location: [facility];  Service: Endoscopy;  Laterality: N/A;  . ESOPHAGOGASTRODUODENOSCOPY N/A 02/27/2013   Procedure: ESOPHAGOGASTRODUODENOSCOPY (EGD);  Surgeon: Ladene Artist, MD;  Location: [facility] ENDOSCOPY;  Service: Endoscopy;  Laterality: N/A;  . ESOPHAGOGASTRODUODENOSCOPY N/A 03/28/2013   Procedure: ESOPHAGOGASTRODUODENOSCOPY (EGD);  Surgeon: Rogene Houston, MD;  Location: [facility];  Service: Endoscopy;  Laterality: N/A;  . HOT  HEMOSTASIS N/A 02/27/2013   Procedure: HOT HEMOSTASIS (ARGON PLASMA COAGULATION/BICAP);  Surgeon: Ladene Artist, MD;  Location: [facility] ENDOSCOPY;  Service: Endoscopy;  Laterality: N/A;  . NOSE SURGERY     for skin cancer  . POLYPECTOMY  07/28/2017   Procedure: POLYPECTOMY;  Surgeon: Rogene Houston, MD;  Location: [facility];  Service: Endoscopy;;  colon     Social History   Socioeconomic History  . Marital status: Widowed    Spouse name: Not on file  . Number of children: Not on file  . Years of education: Not on file  . Highest education level: Not on file  Occupational History  . Not on file  Tobacco Use  . Smoking status: Never Smoker  . Smokeless tobacco: Never Used  Substance and Sexual Activity  . Alcohol use: No    Alcohol/week: 0.0 standard drinks  . Drug use: No  . Sexual activity: Not on file  Other Topics Concern  . Not on file  Social History Narrative  . Not on file   Social Determinants of Health   Financial Resource Strain:   . Difficulty of Paying Living Expenses: Not on file  Food Insecurity:   . Worried About Charity fundraiser in the Last Year: Not on file  . Ran Out of Food in the Last Year: Not on file  Transportation Needs:   . Lack of Transportation (Medical): Not on file  . Lack of Transportation (Non-Medical): Not on file  Physical Activity:   . Days of Exercise per Week: Not on file  . Minutes of Exercise per Session: Not on file  Stress:   . Feeling of Stress : Not on file  Social Connections:   . Frequency of Communication with Friends and Family: Not on file  . Frequency of Social Gatherings with Friends and Family: Not on file  . Attends Religious Services: Not on file  . Active Member of Clubs or Organizations: Not on file  . Attends Archivist Meetings: Not on file  . Marital Status: Not on file  Intimate Partner Violence:   . Fear of Current or Ex-Partner: Not on file  . Emotionally Abused: Not on file  .  Physically Abused: Not on file  . Sexually Abused: Not on file     Vitals:   04/16/19 1517  BP: 128/76  Pulse: 81  SpO2: 90%  Weight: 114 lb (51.7 kg)  Height: 5\' 4"  (1.626 m)    Wt Readings from Last 3 Encounters:  04/16/19 114 lb (51.7 kg)  03/17/19 112 lb 14 oz (51.2 kg)  03/07/19 110 lb 3.7 oz (50 kg)     PHYSICAL EXAM General: NAD, using oxygen by nasal cannula HEENT: Normal. Neck: No JVD, no  thyromegaly. Lungs: Clear to auscultation bilaterally with normal respiratory effort. CV: Regular rate and irregular rhythm, normal S1/S2, no S3, no murmur. No pretibial or periankle edema.   Abdomen: Soft, nontender, no distention.  Neurologic: Alert and oriented.  Psych: Normal affect. Skin: Normal. Musculoskeletal: No gross deformities.      Labs: Lab Results  Component Value Date/Time   K 3.6 03/19/2019 05:50 AM   BUN 19 03/19/2019 05:50 AM   CREATININE 1.12 (H) 03/19/2019 05:50 AM   ALT 13 03/14/2019 04:49 AM   TSH 13.447 (H) 03/18/2019 09:48 AM   TSH 2.125 08/22/2014 11:23 AM   HGB 9.8 (L) 03/14/2019 04:49 AM     Lipids: No results found for: LDLCALC, LDLDIRECT, CHOL, TRIG, HDL     ASSESSMENT AND PLAN:  1.  Persistent atrial fibrillation: Currently on Cardizem SR 90 mg twice daily, amiodarone 200 mg twice daily, and Toprol-XL 25 mg daily.  No anticoagulation given history of chronic anemia and GI bleeds.  I will stop amiodarone and increase Toprol-XL to 50 mg daily.  2. Chronic diastolic heart failure: Currently on Lasix 60 mg.  3.  Anemia: I will obtain a copy of most recent labs from hospitalization at [facility].  4.  Mitral regurgitation: Moderate in severity by echocardiogram on 01/28/2019.   Disposition: Follow up 3 months virtual visit  Time spent: 40 minutes, of which greater than 50% was spent reviewing symptoms, relevant blood tests and studies, and discussing management plan with the patient.    Kate Sable, M.D., F.A.C.C.

## 2019-04-17 ENCOUNTER — Emergency Department (HOSPITAL_COMMUNITY): Payer: Medicare Other

## 2019-04-17 ENCOUNTER — Inpatient Hospital Stay (HOSPITAL_COMMUNITY)
Admission: EM | Admit: 2019-04-17 | Discharge: 2019-04-21 | DRG: 314 | Disposition: A | Discharge status: Skilled Nursing Facility | Payer: Medicare Other | Attending: Internal Medicine | Admitting: Internal Medicine

## 2019-04-17 ENCOUNTER — Telehealth: Payer: Self-pay | Admitting: Cardiovascular Disease

## 2019-04-17 ENCOUNTER — Other Ambulatory Visit: Payer: Self-pay

## 2019-04-17 DIAGNOSIS — I4891 Unspecified atrial fibrillation: Secondary | ICD-10-CM | POA: Diagnosis not present

## 2019-04-17 DIAGNOSIS — Z8701 Personal history of pneumonia (recurrent): Secondary | ICD-10-CM | POA: Diagnosis not present

## 2019-04-17 DIAGNOSIS — M6281 Muscle weakness (generalized): Secondary | ICD-10-CM | POA: Diagnosis not present

## 2019-04-17 DIAGNOSIS — E785 Hyperlipidemia, unspecified: Secondary | ICD-10-CM | POA: Diagnosis present

## 2019-04-17 DIAGNOSIS — N1831 Chronic kidney disease, stage 3a: Secondary | ICD-10-CM | POA: Diagnosis not present

## 2019-04-17 DIAGNOSIS — I5032 Chronic diastolic (congestive) heart failure: Secondary | ICD-10-CM | POA: Diagnosis not present

## 2019-04-17 DIAGNOSIS — E869 Volume depletion, unspecified: Secondary | ICD-10-CM | POA: Diagnosis present

## 2019-04-17 DIAGNOSIS — Z7189 Other specified counseling: Secondary | ICD-10-CM | POA: Diagnosis not present

## 2019-04-17 DIAGNOSIS — Z8711 Personal history of peptic ulcer disease: Secondary | ICD-10-CM

## 2019-04-17 DIAGNOSIS — Z8249 Family history of ischemic heart disease and other diseases of the circulatory system: Secondary | ICD-10-CM | POA: Diagnosis not present

## 2019-04-17 DIAGNOSIS — I10 Essential (primary) hypertension: Secondary | ICD-10-CM | POA: Diagnosis not present

## 2019-04-17 DIAGNOSIS — I130 Hypertensive heart and chronic kidney disease with heart failure and stage 1 through stage 4 chronic kidney disease, or unspecified chronic kidney disease: Secondary | ICD-10-CM | POA: Diagnosis not present

## 2019-04-17 DIAGNOSIS — Z66 Do not resuscitate: Secondary | ICD-10-CM | POA: Diagnosis not present

## 2019-04-17 DIAGNOSIS — I4819 Other persistent atrial fibrillation: Secondary | ICD-10-CM | POA: Diagnosis present

## 2019-04-17 DIAGNOSIS — R627 Adult failure to thrive: Secondary | ICD-10-CM | POA: Diagnosis not present

## 2019-04-17 DIAGNOSIS — R197 Diarrhea, unspecified: Secondary | ICD-10-CM | POA: Diagnosis not present

## 2019-04-17 DIAGNOSIS — Z743 Need for continuous supervision: Secondary | ICD-10-CM | POA: Diagnosis not present

## 2019-04-17 DIAGNOSIS — R000 Tachycardia, unspecified: Secondary | ICD-10-CM | POA: Diagnosis not present

## 2019-04-17 DIAGNOSIS — E440 Moderate protein-calorie malnutrition: Secondary | ICD-10-CM | POA: Diagnosis not present

## 2019-04-17 DIAGNOSIS — I509 Heart failure, unspecified: Secondary | ICD-10-CM | POA: Diagnosis not present

## 2019-04-17 DIAGNOSIS — I959 Hypotension, unspecified: Principal | ICD-10-CM | POA: Diagnosis present

## 2019-04-17 DIAGNOSIS — R4020 Unspecified coma: Secondary | ICD-10-CM | POA: Diagnosis not present

## 2019-04-17 DIAGNOSIS — R404 Transient alteration of awareness: Secondary | ICD-10-CM | POA: Diagnosis not present

## 2019-04-17 DIAGNOSIS — R531 Weakness: Secondary | ICD-10-CM | POA: Diagnosis not present

## 2019-04-17 DIAGNOSIS — E860 Dehydration: Secondary | ICD-10-CM | POA: Diagnosis present

## 2019-04-17 DIAGNOSIS — K529 Noninfective gastroenteritis and colitis, unspecified: Secondary | ICD-10-CM | POA: Diagnosis not present

## 2019-04-17 DIAGNOSIS — R402411 Glasgow coma scale score 13-15, in the field [EMT or ambulance]: Secondary | ICD-10-CM | POA: Diagnosis not present

## 2019-04-17 DIAGNOSIS — I5033 Acute on chronic diastolic (congestive) heart failure: Secondary | ICD-10-CM | POA: Diagnosis present

## 2019-04-17 DIAGNOSIS — Z7401 Bed confinement status: Secondary | ICD-10-CM | POA: Diagnosis not present

## 2019-04-17 DIAGNOSIS — J189 Pneumonia, unspecified organism: Secondary | ICD-10-CM | POA: Diagnosis not present

## 2019-04-17 DIAGNOSIS — Z20822 Contact with and (suspected) exposure to covid-19: Secondary | ICD-10-CM | POA: Diagnosis not present

## 2019-04-17 DIAGNOSIS — Z515 Encounter for palliative care: Secondary | ICD-10-CM | POA: Diagnosis not present

## 2019-04-17 DIAGNOSIS — E039 Hypothyroidism, unspecified: Secondary | ICD-10-CM | POA: Diagnosis present

## 2019-04-17 DIAGNOSIS — J90 Pleural effusion, not elsewhere classified: Secondary | ICD-10-CM | POA: Diagnosis not present

## 2019-04-17 DIAGNOSIS — Z9981 Dependence on supplemental oxygen: Secondary | ICD-10-CM | POA: Diagnosis not present

## 2019-04-17 MED ORDER — ONDANSETRON HCL 4 MG/2ML IJ SOLN: 4.0000 mg | Freq: Four times a day (QID) | INTRAMUSCULAR | Status: DC | PRN

## 2019-04-17 MED ORDER — MORPHINE SULFATE (CONCENTRATE) 10 MG/0.5ML PO SOLN
5.0000 mg | ORAL | Status: DC | PRN
  Filled 2019-04-17 (×2): qty 0.5

## 2019-04-17 MED ORDER — MORPHINE SULFATE (CONCENTRATE) 10 MG/0.5ML PO SOLN
5.0000 mg | ORAL | Status: DC | PRN
  Administered 2019-04-18 (×2): 5 mg via ORAL
  Filled 2019-04-17 (×5): qty 0.5

## 2019-04-17 MED ORDER — HALOPERIDOL LACTATE 2 MG/ML PO CONC
0.5000 mg | ORAL | Status: DC | PRN
  Filled 2019-04-17: qty 0.3

## 2019-04-17 MED ORDER — ONDANSETRON 4 MG PO TBDP: 4.0000 mg | ORAL_TABLET | Freq: Four times a day (QID) | ORAL | Status: DC | PRN

## 2019-04-17 MED ORDER — LOPERAMIDE HCL 2 MG PO CAPS
2.0000 mg | ORAL_CAPSULE | ORAL | Status: DC | PRN
  Administered 2019-04-20: 2 mg via ORAL
  Filled 2019-04-17: qty 1

## 2019-04-17 MED ORDER — LORAZEPAM 2 MG/ML PO CONC: 1.0000 mg | ORAL | Status: DC | PRN

## 2019-04-17 MED ORDER — GLYCOPYRROLATE 1 MG PO TABS
1.0000 mg | ORAL_TABLET | ORAL | Status: DC | PRN
  Filled 2019-04-17: qty 1

## 2019-04-17 MED ORDER — ACETAMINOPHEN 325 MG PO TABS: 650.0000 mg | ORAL_TABLET | Freq: Four times a day (QID) | ORAL | Status: DC | PRN

## 2019-04-17 MED ORDER — LORAZEPAM 1 MG PO TABS: 1.0000 mg | ORAL_TABLET | ORAL | Status: DC | PRN

## 2019-04-17 MED ORDER — HALOPERIDOL LACTATE 5 MG/ML IJ SOLN
0.5000 mg | INTRAMUSCULAR | Status: DC | PRN
  Administered 2019-04-18 – 2019-04-20 (×3): 0.5 mg via INTRAVENOUS
  Filled 2019-04-17 (×3): qty 1

## 2019-04-17 MED ORDER — FENTANYL CITRATE (PF) 100 MCG/2ML IJ SOLN
50.0000 ug | Freq: Once | INTRAMUSCULAR | Status: AC
  Administered 2019-04-17: 50 ug via INTRAVENOUS
  Filled 2019-04-17: qty 2

## 2019-04-17 MED ORDER — ACETAMINOPHEN 650 MG RE SUPP: 650.0000 mg | Freq: Four times a day (QID) | RECTAL | Status: DC | PRN

## 2019-04-17 MED ORDER — LORAZEPAM 2 MG/ML IJ SOLN
1.0000 mg | INTRAMUSCULAR | Status: DC | PRN
  Administered 2019-04-18: 1 mg via INTRAVENOUS
  Filled 2019-04-17: qty 1

## 2019-04-17 MED ORDER — DIPHENHYDRAMINE HCL 50 MG/ML IJ SOLN: 12.5000 mg | INTRAMUSCULAR | Status: DC | PRN

## 2019-04-17 MED ORDER — GLYCOPYRROLATE 0.2 MG/ML IJ SOLN
0.2000 mg | INTRAMUSCULAR | Status: DC | PRN
  Administered 2019-04-17: 0.2 mg via INTRAVENOUS
  Filled 2019-04-17: qty 1

## 2019-04-17 MED ORDER — SODIUM CHLORIDE 0.9 % IV BOLUS
1000.0000 mL | Freq: Once | INTRAVENOUS | Status: AC
  Administered 2019-04-17: 1000 mL via INTRAVENOUS

## 2019-04-17 MED ORDER — HALOPERIDOL 0.5 MG PO TABS
0.5000 mg | ORAL_TABLET | ORAL | Status: DC | PRN
  Filled 2019-04-17: qty 1

## 2019-04-17 MED ORDER — ACETAMINOPHEN 325 MG PO TABS
650.0000 mg | ORAL_TABLET | Freq: Once | ORAL | Status: DC
  Filled 2019-04-17: qty 2

## 2019-04-17 MED ORDER — GLYCOPYRROLATE 0.2 MG/ML IJ SOLN: 0.2000 mg | INTRAMUSCULAR | Status: DC | PRN

## 2019-04-17 NOTE — Telephone Encounter (Signed)
Paged by family member via on-call service that patient was diaphoretic with low BP (98/50). Returned call immediately and was informed by family member that EMS has been activated as patient had become unresponsive. Daughter (who is an Therapist, sports) was with patient - patient breathing and reportedly had a pulse. Confirmed with family that 74 had been called and ambulance en route. Will alert Dr. Bronson Ing.   Megha Agnes K. Marletta Lor, MD

## 2019-04-17 NOTE — ED Provider Notes (Addendum)
[facility] EMERGENCY DEPARTMENT Provider Note   CSN: [id number] Arrival date & time: 04/17/19  1842     History Chief Complaint  Patient presents with  . Altered Mental Status    Renee Blake is a 86 y.o. female.  Level 5 caveat for acuity of condition.  Daughter reports altered mental status today.  Copious amount of diarrhea.  Multiple comorbidities including atrial fibrillation, chronic diarrhea, failure to thrive, recent pneumonia, CHF and the others.  She was hypotensive initially.  Recent admission to [facility] with "pneumonia".  Not presently on antibiotics.        Past Medical History:  Diagnosis Date  . Acute blood loss anemia   . Atrial fibrillation ([place])    a. diagnosed in 12/2018. Rate-control pursued given not a candidate for anticoagulation  . Chronic diarrhea   . Dilation of biliary tract   . Duodenal stricture   . Esophageal stricture   . Gastric ulcer   . GI bleed   . Hyperlipemia   . Hypertension   . Microscopic colitis   . Nephrolithiasis   . Pancreatic duct dilated   . Schatzki's ring   . Skin cancer   . Sliding hiatal hernia   . Vertigo     Patient Active Problem List   Diagnosis Date Noted  . Debility 03/16/2019  . Shortness of breath 03/16/2019  . Cachexia ([place]) 03/16/2019  . Permanent atrial fibrillation ([place])   . Hypoxia 03/15/2019  . CHF exacerbation ([place]) 03/14/2019  . Bilateral pleural effusion 03/04/2019  . Chronic atrial fibrillation ([place]) 03/04/2019  . DNR (do not resuscitate) 03/04/2019  . Acute on chronic diastolic congestive heart failure ([place]) 03/02/2019  . Chronic diastolic heart failure ([place]) 03/02/2019  . Chronic renal failure, stage 3b 03/02/2019  . Acute respiratory failure with hypoxia ([place]) 03/02/2019  . Acute exacerbation of CHF (congestive heart failure) ([place]) 01/27/2019  . Atrial fibrillation with RVR ([place]) 01/27/2019  . Iron deficiency anemia due to chronic blood loss---H/o Gi Bleed 01/27/2019  .  Vaginal mass 07/29/2017  . Heme positive stool 07/25/2017  . Cellulitis 12/10/2016  . Facial cellulitis   . Erroneous encounter - disregard 03/17/2015  . AKI (acute kidney injury) ([place])   . Metabolic acidosis 0000000  . Acute renal failure syndrome ([place])   . Renal failure 08/22/2014  . Hyperkalemia 08/22/2014  . UTI (urinary tract infection) 08/22/2014  . Acute renal failure ([place]) 08/22/2014  . Dyspnea 03/15/2014  . Diabetes mellitus ([place]) 06/08/2013  . Acute diastolic congestive heart failure ([place]) 06/07/2013  . Syncope 03/26/2013  . Rectal bleeding 03/26/2013  . Dehydration 03/26/2013  . Generalized weakness 03/26/2013  . Malnutrition of moderate degree ([place]) 03/01/2013  . Diarrhea 02/28/2013  . Loss of weight 02/28/2013  . H/o Prior duodenal ulcer with hemorrhage 02/27/2013  . H/o Prior Lower GI bleed (anorectal Ca) 02/26/2013  . Acute blood loss anemia 02/26/2013  . UTI (lower urinary tract infection) 02/26/2013  . Hypertension 02/26/2013    Past Surgical History:  Procedure Laterality Date  . BIOPSY  07/28/2017   Procedure: BIOPSY;  Surgeon: Rogene Houston, MD;  Location[facility];  Service: Endoscopy;;  rectum  . COLONOSCOPY N/A 07/28/2017   Procedure: COLONOSCOPY;  Surgeon: Rogene Houston, MD;  Location: [facility] ENDO [facility];  Service: Endoscopy;  Laterality: N/A;  . ESOPHAGOGASTRODUODENOSCOPY N/A 02/27/2013   Procedure: ESOPHAGOGASTRODUODENOSCOPY (EGD);  Surgeon: Ladene Artist, MD;  Location: [facility] ENDOSCOPY;  Service: Endoscopy;  Laterality: N/A;  .  ESOPHAGOGASTRODUODENOSCOPY N/A 03/28/2013   Procedure: ESOPHAGOGASTRODUODENOSCOPY (EGD);  Surgeon: Rogene Houston, MD;  Location[facility];  Service: Endoscopy;  Laterality: N/A;  . HOT HEMOSTASIS N/A 02/27/2013   Procedure: HOT HEMOSTASIS (ARGON PLASMA COAGULATION/BICAP);  Surgeon: Ladene Artist, MD;  Location: [facility] ENDOSCOPY;  Service: Endoscopy;  Laterality: N/A;  . NOSE SURGERY     for skin cancer  .  POLYPECTOMY  07/28/2017   Procedure: POLYPECTOMY;  Surgeon: Rogene Houston, MD;  Location: [facility];  Service: Endoscopy;;  colon      OB History    Gravida  6   Para  6   Term  6   Preterm      AB      Living  6     SAB      TAB      Ectopic      Multiple      Live Births              Family History  Problem Relation Age of Onset  . Heart attack Father     Social History   Tobacco Use  . Smoking status: Never Smoker  . Smokeless tobacco: Never Used  Substance Use Topics  . Alcohol use: No    Alcohol/week: 0.0 standard drinks  . Drug use: No    Home Medications Prior to Admission medications   Medication Sig Start Date End Date Taking? Authorizing Provider  ALPRAZolam (XANAX) 0.25 MG tablet Take 1 tablet (0.25 mg total) by mouth 2 (two) times daily as needed. for anxiety 03/18/19   Tat, Shanon Brow, MD  dicyclomine (BENTYL) 10 MG capsule Take 10 mg by mouth every 4 (four) hours as needed for spasms.    [provider]  diltiazem (CARDIZEM SR) 90 MG 12 hr capsule Take 1 capsule (90 mg total) by mouth 2 (two) times daily. 04/03/19   Kroeger, Lorelee Cover., PA-C  furosemide (LASIX) 40 MG tablet Take 60 mg by mouth.    [provider]  levocetirizine (XYZAL) 5 MG tablet Take 5 mg by mouth every evening.    [provider]  levothyroxine (SYNTHROID) 25 MCG tablet Take 25 mcg by mouth daily before breakfast.    [provider]  meclizine (ANTIVERT) 25 MG tablet Take 25 mg by mouth daily as needed for dizziness.  07/11/17   [provider]  metoprolol succinate (TOPROL-XL) 50 MG 24 hr tablet Take 1 tablet (50 mg total) by mouth every morning. Take with or immediately following a meal. 04/16/19 07/15/19  Herminio Commons, MD  Multiple Vitamin (MULTIVITAMIN WITH MINERALS) TABS tablet Take 1 tablet by mouth daily. 03/19/19   Orson Eva, MD  ondansetron (ZOFRAN) 4 MG tablet Take 4 mg by mouth every 8 (eight) hours as needed  for nausea or vomiting.    [provider]  pantoprazole (PROTONIX) 40 MG tablet Take 1 tablet (40 mg total) by mouth daily. 02/06/19   Roxan Hockey, MD  vitamin B-12 (CYANOCOBALAMIN) 500 MCG tablet Take 1 tablet (500 mcg total) by mouth daily. 03/19/19   Orson Eva, MD    Allergies    Clonazepam, Codeine, and Sulfa antibiotics  Review of Systems   Review of Systems  Unable to perform ROS: Acuity of condition    Physical Exam Updated Vital Signs BP (!) 69/50   Pulse (!) 47   Temp 98.5 F (36.9 C) (Rectal)   Resp 19   Ht 5\' 4"  (1.626 m)  Wt 51.7 kg   BMI 19.57 kg/m   Physical Exam Vitals and nursing note reviewed.  Constitutional:      Appearance: She is well-developed.     Comments: Frail, cachectic  HENT:     Head: Normocephalic and atraumatic.  Eyes:     Conjunctiva/sclera: Conjunctivae normal.  Cardiovascular:     Rate and Rhythm: Normal rate and regular rhythm.  Pulmonary:     Effort: Pulmonary effort is normal.     Breath sounds: Normal breath sounds.  Abdominal:     General: Bowel sounds are normal.     Palpations: Abdomen is soft.     Comments: Significant brown diarrhea  Musculoskeletal:     Cervical back: Neck supple.     Comments: unable  Skin:    Comments: pale  Neurological:     Comments: Unable  Psychiatric:     Comments: Unable     ED Results / Procedures / Treatments   Labs (all labs ordered are listed, but only abnormal results are displayed) Labs Reviewed  C DIFFICILE QUICK SCREEN W PCR REFLEX  SARS CORONAVIRUS 2 (TAT 6-24 HRS)  CULTURE, BLOOD (ROUTINE X 2)  CULTURE, BLOOD (ROUTINE X 2)  CBC WITH DIFFERENTIAL/PLATELET  COMPREHENSIVE METABOLIC PANEL  URINALYSIS, ROUTINE W REFLEX MICROSCOPIC  LACTIC ACID, PLASMA  LACTIC ACID, PLASMA    EKG EKG Interpretation  Date/Time:  Saturday April 17 2019 18:51:04 EST Ventricular Rate:  74 PR Interval:    QRS Duration: 176 QT Interval:  539 QTC Calculation: 599 R  Axis:   -54 Text Interpretation: Atrial fibrillation RBBB and LAFB Probable left ventricular hypertrophy Confirmed by Nat Christen [phone]) on 04/17/2019 6:53:35 PM   Radiology DG Chest Port 1 View  Result Date: 04/17/2019 CLINICAL DATA:  Weakness. Recent pneumonia. EXAM: PORTABLE CHEST 1 VIEW COMPARISON:  Radiograph 04/05/2019, CT 04/06/2019. [facility] imaging at [facility]. FINDINGS: Cardiomegaly, similar to slightly progressed from prior. Bilateral pleural effusions with blunting of the costophrenic angles. Worsening interstitial opacities. No pneumothorax. Biapical pleuroparenchymal scarring. IMPRESSION: 1. Findings consistent with CHF. Slight worsening in pulmonary edema and cardiomegaly from prior. 2. Small pleural effusions are similar to prior on AP projection. Electronically Signed   By: Keith Rake M.D.   On: 04/17/2019 19:45    Procedures Procedures (including critical care time)  Medications Ordered in ED Medications  fentaNYL (SUBLIMAZE) injection 50 mcg (has no administration in time range)  sodium chloride 0.9 % bolus 1,000 mL ( Intravenous Rate/Dose Change 04/17/19 2103)    ED Course  I have reviewed the triage vital signs and the nursing notes.  Pertinent labs & imaging results that were available during my care of the patient were reviewed by me and considered in my medical decision making (see chart for details).    MDM Rules/Calculators/A&P                      Patient presents with diarrhea and hypotension.  Could be C. difficile.  Chest x-ray suggest pulmonary edema.  Will gently hydrate, admit.  DO NOT RESUSCITATE.  2100: Chest x-ray reviewed which suggest pulmonary edema.  Patient remains hypotensive.  Discussed critical findings with daughter.  Comfort care only at this time.  Fentanyl 25 mg IV ordered for pain.  Will admit. CRITICAL CARE Performed by: Nat Christen Total critical care time: 40 minutes Critical care time was exclusive of separately billable  procedures and treating other patients. Critical care was necessary to treat or prevent imminent  or life-threatening deterioration. Critical care was time spent personally by me on the following activities: development of treatment plan with patient and/or surrogate as well as nursing, discussions with consultants, evaluation of patient's response to treatment, examination of patient, obtaining history from patient or surrogate, ordering and performing treatments and interventions, ordering and review of laboratory studies, ordering and review of radiographic studies, pulse oximetry and re-evaluation of patient's condition. Final Clinical Impression(s) / ED Diagnoses Final diagnoses:  Diarrhea, unspecified type  Hypotension, unspecified hypotension type    Rx / DC Orders ED Discharge Orders    None       Nat Christen, MD 04/17/19 2041    Nat Christen, MD 04/17/19 [id number]    Nat Christen, MD 04/17/19 2105    Nat Christen, MD 04/17/19 2157

## 2019-04-17 NOTE — H&P (Signed)
History and Physical  Renee Blake [id number] DOB: [birth year] DOA: 04/17/2019  Referring physician: Dr Lacinda Axon, ED physician PCP: [name], MD  Outpatient Specialists:  Patient Coming From: home  Chief Complaint: hypotension  HPI: Renee Blake is a 86 y.o. female with a history of acute blood loss anemia, atrial fibrillation, chronic diarrhea, history of GI bleed, hypertension, chronic kidney disease, gastric ulcer.  Patient seen for hypotension over the past 12 hours that has been worsening.  She has been having fairly chronic diarrhea that has been worsening.  Emergency Department Course: EDP had conversation with the patient after determining that the patient did not have capacity.  Decision was made to make patient comfort measures only  Review of Systems:   Unable to obtain  Past Medical History:  Diagnosis Date  . Acute blood loss anemia   . Atrial fibrillation ([place])    a. diagnosed in 12/2018. Rate-control pursued given not a candidate for anticoagulation  . Chronic diarrhea   . Dilation of biliary tract   . Duodenal stricture   . Esophageal stricture   . Gastric ulcer   . GI bleed   . Hyperlipemia   . Hypertension   . Microscopic colitis   . Nephrolithiasis   . Pancreatic duct dilated   . Schatzki's ring   . Skin cancer   . Sliding hiatal hernia   . Vertigo    Past Surgical History:  Procedure Laterality Date  . BIOPSY  07/28/2017   Procedure: BIOPSY;  Surgeon: Rogene Houston, MD;  [facility];  Service: Endoscopy;;  rectum  . COLONOSCOPY N/A 07/28/2017   Procedure: COLONOSCOPY;  Surgeon: Rogene Houston, MD;  Location[facility];  Service: Endoscopy;  Laterality: N/A;  . ESOPHAGOGASTRODUODENOSCOPY N/A 02/27/2013   Procedure: ESOPHAGOGASTRODUODENOSCOPY (EGD);  Surgeon: Ladene Artist, MD;  Location: [facility] ENDOSCOPY;  Service: Endoscopy;  Laterality: N/A;  . ESOPHAGOGASTRODUODENOSCOPY N/A 03/28/2013   Procedure:  ESOPHAGOGASTRODUODENOSCOPY (EGD);  Surgeon: Rogene Houston, MD;  [facility];  Service: Endoscopy;  Laterality: N/A;  . HOT HEMOSTASIS N/A 02/27/2013   Procedure: HOT HEMOSTASIS (ARGON PLASMA COAGULATION/BICAP);  Surgeon: Ladene Artist, MD;  Location: [facility] ENDOSCOPY;  Service: Endoscopy;  Laterality: N/A;  . NOSE SURGERY     for skin cancer  . POLYPECTOMY  07/28/2017   Procedure: POLYPECTOMY;  Surgeon: Rogene Houston, MD;  [facility];  Service: Endoscopy;;  colon    Social History:  reports that she has never smoked. She has never used smokeless tobacco. She reports that she does not drink alcohol or use drugs. Patient lives at home  Allergies  Allergen Reactions  . Clonazepam Other (See Comments)    Pruritis  . Codeine Nausea And Vomiting and Other (See Comments)    Reaction unknown  . Sulfa Antibiotics Other (See Comments)    Reactions unknown    Family History  Problem Relation Age of Onset  . Heart attack Father       Prior to Admission medications   Medication Sig Start Date End Date Taking? Authorizing Provider  ALPRAZolam (XANAX) 0.25 MG tablet Take 1 tablet (0.25 mg total) by mouth 2 (two) times daily as needed. for anxiety 03/18/19   Tat, Shanon Brow, MD  dicyclomine (BENTYL) 10 MG capsule Take 10 mg by mouth every 4 (four) hours as needed for spasms.    [provider]  diltiazem (CARDIZEM SR) 90 MG 12 hr capsule Take 1 capsule (90  mg total) by mouth 2 (two) times daily. 04/03/19   Kroeger, Lorelee Cover., PA-C  furosemide (LASIX) 40 MG tablet Take 60 mg by mouth.    [provider]  levocetirizine (XYZAL) 5 MG tablet Take 5 mg by mouth every evening.    [provider]  levothyroxine (SYNTHROID) 25 MCG tablet Take 25 mcg by mouth daily before breakfast.    [provider]  meclizine (ANTIVERT) 25 MG tablet Take 25 mg by mouth daily as needed for dizziness.  07/11/17   [provider]  metoprolol succinate  (TOPROL-XL) 50 MG 24 hr tablet Take 1 tablet (50 mg total) by mouth every morning. Take with or immediately following a meal. 04/16/19 07/15/19  Herminio Commons, MD  Multiple Vitamin (MULTIVITAMIN WITH MINERALS) TABS tablet Take 1 tablet by mouth daily. 03/19/19   Orson Eva, MD  ondansetron (ZOFRAN) 4 MG tablet Take 4 mg by mouth every 8 (eight) hours as needed for nausea or vomiting.    [provider]  pantoprazole (PROTONIX) 40 MG tablet Take 1 tablet (40 mg total) by mouth daily. 02/06/19   Roxan Hockey, MD  vitamin B-12 (CYANOCOBALAMIN) 500 MCG tablet Take 1 tablet (500 mcg total) by mouth daily. 03/19/19   Orson Eva, MD    Physical Exam: BP (!) 69/50   Pulse (!) 47   Temp 98.5 F (36.9 C) (Rectal)   Resp 19   Ht 5\' 4"  (1.626 m)   Wt 51.7 kg   BMI 19.57 kg/m   . General: Elderly female.  Briefly opens eyes when addressed, but unable to adequately respond . HEENT: Normocephalic atraumatic.  Right and left ears normal in appearance.  Pupils equal, round, reactive to light. Extraocular muscles are intact. Sclerae anicteric and noninjected.  Dry mucosal membranes. No mucosal lesions.  . Neck: Neck supple without lymphadenopathy. No carotid bruits. No masses palpated.  . Cardiovascular: Regular rate . Respiratory: Exam not done . Abdomen: Soft, nontender, nondistended. Active bowel sounds. No masses or hepatosplenomegaly  . Skin: No rashes, lesions, or ulcerations.  Dry, warm to touch. 2+ dorsalis pedis and radial pulses. . Musculoskeletal: No calf or leg pain. All major joints not erythematous nontender.  No upper or lower joint deformation.  Good ROM.  No contractures  . Psychiatric: Patient lacks judgment and insight and does not have capacity . Neurologic: No major focal deficits observed.  Patient unable to comply with exam to evaluate for true focal deficits           Labs on Admission: I have personally reviewed following labs and imaging studies  CBC: No  results for input(s): WBC, NEUTROABS, HGB, HCT, MCV, PLT in the last 168 hours. Basic Metabolic Panel: No results for input(s): NA, K, CL, CO2, GLUCOSE, BUN, CREATININE, CALCIUM, MG, PHOS in the last 168 hours. GFR: CrCl cannot be calculated (Patient's most recent lab result is older than the maximum 21 days allowed.). Liver Function Tests: No results for input(s): AST, ALT, ALKPHOS, BILITOT, PROT, ALBUMIN in the last 168 hours. No results for input(s): LIPASE, AMYLASE in the last 168 hours. No results for input(s): AMMONIA in the last 168 hours. Coagulation Profile: No results for input(s): INR, PROTIME in the last 168 hours. Cardiac Enzymes: No results for input(s): CKTOTAL, CKMB, CKMBINDEX, TROPONINI in the last 168 hours. BNP (last 3 results) No results for input(s): PROBNP in the last 8760 hours. HbA1C: No results for input(s): HGBA1C in the last 72 hours. CBG: No results for  input(s): GLUCAP in the last 168 hours. Lipid Profile: No results for input(s): CHOL, HDL, LDLCALC, TRIG, CHOLHDL, LDLDIRECT in the last 72 hours. Thyroid Function Tests: No results for input(s): TSH, T4TOTAL, FREET4, T3FREE, THYROIDAB in the last 72 hours. Anemia Panel: No results for input(s): VITAMINB12, FOLATE, FERRITIN, TIBC, IRON, RETICCTPCT in the last 72 hours. Urine analysis:    Component Value Date/Time   COLORURINE YELLOW 01/27/2019 1533   APPEARANCEUR CLOUDY (A) 01/27/2019 1533   LABSPEC 1.005 01/27/2019 1533   PHURINE 7.0 01/27/2019 1533   GLUCOSEU NEGATIVE 01/27/2019 1533   HGBUR SMALL (A) 01/27/2019 1533   BILIRUBINUR NEGATIVE 01/27/2019 1533   [place] 01/27/2019 1533   PROTEINUR NEGATIVE 01/27/2019 1533   UROBILINOGEN 0.2 08/22/2014 1300   NITRITE NEGATIVE 01/27/2019 1533   LEUKOCYTESUR LARGE (A) 01/27/2019 1533   Sepsis Labs: @LABRCNTIP (procalcitonin:4,lacticidven:4) )No results found for this or any previous visit (from the past 240 hour(s)).   Radiological Exams on  Admission: DG Chest Port 1 View  Result Date: 04/17/2019 CLINICAL DATA:  Weakness. Recent pneumonia. EXAM: PORTABLE CHEST 1 VIEW COMPARISON:  Radiograph 04/05/2019, CT 04/06/2019. Prior [facility] at [facility]. FINDINGS: Cardiomegaly, similar to slightly progressed from prior. Bilateral pleural effusions with blunting of the costophrenic angles. Worsening interstitial opacities. No pneumothorax. Biapical pleuroparenchymal scarring. IMPRESSION: 1. Findings consistent with CHF. Slight worsening in pulmonary edema and cardiomegaly from prior. 2. Small pleural effusions are similar to prior on AP projection. Electronically Signed   By: Keith Rake M.D.   On: 04/17/2019 19:45    Assessment/Plan: Active Problems:   Diarrhea   Malnutrition of moderate degree (HCC)   Generalized weakness   Acute exacerbation of CHF (congestive heart failure) ([place])   DNR (do not resuscitate)   Hypotension    This patient was discussed with the ED physician, including pertinent vitals, physical exam findings, labs, and imaging.  We also discussed care given by the ED provider.  1. Diarrhea 2. Hypotension 3. Malnutrition of moderate degree 4. Generalized weakness 5. Acute exacerbation of CHF 6. Comfort measures only  Patient daughters present in the room.  I had a conversation myself.  Patient does not have capacity.  Discussed current status the patient with the patient's daughter.  Comfort measures desired.  Morphine sublingual, Ativan sublingual as needed  Robinul as needed  Haldol as needed  Palliative care consult  I have discontinued the labs, IV fluids, vital signs.  Patient does not warrant any other lab testing   DVT prophylaxis: None Consultants: Palliative care Code Status: DNR, comfort measures only Family Communication: Daughter Disposition Plan: Patient would likely die within next 24 hours.   Truett Mainland, DO

## 2019-04-17 NOTE — ED Notes (Signed)
Pt resting comfortably at this time. Equal rise and fall of chest noted. Family at bedside.

## 2019-04-17 NOTE — ED Triage Notes (Signed)
Pt from home via [facility]. Daughter called out for altered mental status. Approx 20 minutes before EMS arrival to home daughter states pt became hot and sweating. Pt opens eyes on command, non responsive otherwise. EMS reports initial HR of 30 and BP of 70/50. Pt cool too touch.

## 2019-04-18 ENCOUNTER — Other Ambulatory Visit: Payer: Self-pay

## 2019-04-18 DIAGNOSIS — I509 Heart failure, unspecified: Secondary | ICD-10-CM | POA: Diagnosis not present

## 2019-04-18 DIAGNOSIS — I4819 Other persistent atrial fibrillation: Secondary | ICD-10-CM | POA: Diagnosis present

## 2019-04-18 DIAGNOSIS — R627 Adult failure to thrive: Secondary | ICD-10-CM | POA: Diagnosis present

## 2019-04-18 DIAGNOSIS — E869 Volume depletion, unspecified: Secondary | ICD-10-CM | POA: Diagnosis present

## 2019-04-18 DIAGNOSIS — Z515 Encounter for palliative care: Secondary | ICD-10-CM | POA: Diagnosis not present

## 2019-04-18 DIAGNOSIS — E860 Dehydration: Secondary | ICD-10-CM | POA: Diagnosis present

## 2019-04-18 DIAGNOSIS — Z8711 Personal history of peptic ulcer disease: Secondary | ICD-10-CM | POA: Diagnosis not present

## 2019-04-18 DIAGNOSIS — E039 Hypothyroidism, unspecified: Secondary | ICD-10-CM | POA: Diagnosis present

## 2019-04-18 DIAGNOSIS — N1831 Chronic kidney disease, stage 3a: Secondary | ICD-10-CM | POA: Diagnosis present

## 2019-04-18 DIAGNOSIS — I130 Hypertensive heart and chronic kidney disease with heart failure and stage 1 through stage 4 chronic kidney disease, or unspecified chronic kidney disease: Secondary | ICD-10-CM | POA: Diagnosis present

## 2019-04-18 DIAGNOSIS — I959 Hypotension, unspecified: Secondary | ICD-10-CM | POA: Diagnosis present

## 2019-04-18 DIAGNOSIS — I5033 Acute on chronic diastolic (congestive) heart failure: Secondary | ICD-10-CM | POA: Diagnosis present

## 2019-04-18 DIAGNOSIS — Z8249 Family history of ischemic heart disease and other diseases of the circulatory system: Secondary | ICD-10-CM | POA: Diagnosis not present

## 2019-04-18 DIAGNOSIS — R197 Diarrhea, unspecified: Secondary | ICD-10-CM | POA: Diagnosis not present

## 2019-04-18 DIAGNOSIS — E440 Moderate protein-calorie malnutrition: Secondary | ICD-10-CM | POA: Diagnosis present

## 2019-04-18 DIAGNOSIS — Z20822 Contact with and (suspected) exposure to covid-19: Secondary | ICD-10-CM | POA: Diagnosis present

## 2019-04-18 DIAGNOSIS — R531 Weakness: Secondary | ICD-10-CM | POA: Diagnosis not present

## 2019-04-18 DIAGNOSIS — E785 Hyperlipidemia, unspecified: Secondary | ICD-10-CM | POA: Diagnosis present

## 2019-04-18 DIAGNOSIS — Z66 Do not resuscitate: Secondary | ICD-10-CM

## 2019-04-18 DIAGNOSIS — Z8701 Personal history of pneumonia (recurrent): Secondary | ICD-10-CM | POA: Diagnosis not present

## 2019-04-18 DIAGNOSIS — Z7189 Other specified counseling: Secondary | ICD-10-CM | POA: Diagnosis not present

## 2019-04-18 DIAGNOSIS — K529 Noninfective gastroenteritis and colitis, unspecified: Secondary | ICD-10-CM | POA: Diagnosis present

## 2019-04-18 LAB — SARS CORONAVIRUS 2 (TAT 6-24 HRS): SARS Coronavirus 2: NEGATIVE

## 2019-04-18 MED ORDER — HALOPERIDOL LACTATE 5 MG/ML IJ SOLN
5.0000 mg | Freq: Once | INTRAMUSCULAR | Status: AC
  Administered 2019-04-18: 5 mg via INTRAVENOUS
  Filled 2019-04-18: qty 1

## 2019-04-18 NOTE — Progress Notes (Signed)
Patient woke up is confused, agitated, attempting to leave. Patient daughter called and patient would not speak with daughter.  Bed alarm on, will continue to monitor patient.

## 2019-04-18 NOTE — ED Notes (Signed)
Checked in on pt. Pt awake, looking around room. When pt saw me she smiled and waved at me.

## 2019-04-18 NOTE — [place] Notes (Signed)
PROGRESS NOTE    Renee Blake  [id number] DOB: 03-30-1929 DOA: 04/17/2019 PCP: [name], MD   Brief Narrative:  Per HPI: Renee Blake is a 86 y.o. female with a history of acute blood loss anemia, atrial fibrillation, chronic diarrhea, history of GI bleed, hypertension, chronic kidney disease, gastric ulcer.  Patient seen for hypotension over the past 12 hours that has been worsening.  She has been having fairly chronic diarrhea that has been worsening.  Emergency Department Course: EDP had conversation with the patient after determining that the patient did not have capacity.  Decision was made to make patient comfort measures only  1/17: Patient presented with some hypotension and was being gently hydrated, unfortunately it appeared that she also had some pulmonary edema and some shortness of breath.  EDP had discussed with family members further and they wanted to ensure that patient was comfortable and did not want any aggressive measures.  Fentanyl was given with some improvement.  This morning, family members are still confused and curious as to whether or not she is near end-of-life.  Blood pressures are improved this morning and palliative care consultation pending for a.m.  Assessment & Plan:   Active Problems:   Diarrhea   Malnutrition of moderate degree (HCC)   Generalized weakness   Acute exacerbation of CHF (congestive heart failure) ([place])   DNR (do not resuscitate)   Hypotension  1. Diarrhea 2. Hypotension 3. Malnutrition of moderate degree 4. Generalized weakness 5. Acute exacerbation of CHF 6. Comfort measures only  Continue current comfort measures and monitor blood pressures closely.  Family members do not desire any aggressive measures and look forward to palliative care discussion in a.m. regarding life expectancy with potential need for home hospice versus rehab.  DVT prophylaxis:None Code Status: DNR/Comfort care Family Communication:  Discussed with daughter on phone Disposition Plan: Continue comfort measures with further discussion with palliative care anticipated tomorrow.   Consultants:   Palliative care  Procedures:   None  Antimicrobials:   None   Subjective: Patient seen and evaluated today with no new acute complaints or concerns. No acute concerns or events noted overnight.  Blood pressures appear to have stabilized.  Objective: Vitals:   04/18/19 0900 04/18/19 0930 04/18/19 1000 04/18/19 1100  BP: (!) 130/59 137/73 129/86 (!) 146/75  Pulse:  (!) 59    Resp:  (!) 21 (!) 21 18  Temp:      TempSrc:      SpO2:  97%    Weight:      Height:       No intake or output data in the 24 hours ending 04/18/19 1144 Filed Weights   04/17/19 1854  Weight: 51.7 kg    Examination:  General exam: Appears calm and comfortable  Respiratory system: Clear to auscultation. Respiratory effort normal.  Currently on nasal cannula oxygen. Cardiovascular system: S1 & S2 heard, RRR. No JVD, murmurs, rubs, gallops or clicks. No pedal edema. Gastrointestinal system: Abdomen is nondistended, soft and nontender. No organomegaly or masses felt. Normal bowel sounds heard. Central nervous system: Alert and awake. Extremities: No edema Skin: No rashes, lesions or ulcers Psychiatry: Difficult to assess.    Data Reviewed: I have personally reviewed following labs and imaging studies  CBC: No results for input(s): WBC, NEUTROABS, HGB, HCT, MCV, PLT in the last 168 hours. Basic Metabolic Panel: No results for input(s): NA, K, CL, CO2, GLUCOSE, BUN, CREATININE, CALCIUM, MG, PHOS in the last 168 hours.  GFR: CrCl cannot be calculated (Patient's most recent lab result is older than the maximum 21 days allowed.). Liver Function Tests: No results for input(s): AST, ALT, ALKPHOS, BILITOT, PROT, ALBUMIN in the last 168 hours. No results for input(s): LIPASE, AMYLASE in the last 168 hours. No results for input(s): AMMONIA in  the last 168 hours. Coagulation Profile: No results for input(s): INR, PROTIME in the last 168 hours. Cardiac Enzymes: No results for input(s): CKTOTAL, CKMB, CKMBINDEX, TROPONINI in the last 168 hours. BNP (last 3 results) No results for input(s): PROBNP in the last 8760 hours. HbA1C: No results for input(s): HGBA1C in the last 72 hours. CBG: No results for input(s): GLUCAP in the last 168 hours. Lipid Profile: No results for input(s): CHOL, HDL, LDLCALC, TRIG, CHOLHDL, LDLDIRECT in the last 72 hours. Thyroid Function Tests: No results for input(s): TSH, T4TOTAL, FREET4, T3FREE, THYROIDAB in the last 72 hours. Anemia Panel: No results for input(s): VITAMINB12, FOLATE, FERRITIN, TIBC, IRON, RETICCTPCT in the last 72 hours. Sepsis Labs: No results for input(s): PROCALCITON, LATICACIDVEN in the last 168 hours.  Recent Results (from the past 240 hour(s))  SARS CORONAVIRUS 2 (TAT 6-24 HRS) Nasopharyngeal Nasopharyngeal Swab     Status: None   Collection Time: 04/17/19  7:38 PM   Specimen: Nasopharyngeal Swab  Result Value Ref Range Status   SARS Coronavirus 2 NEGATIVE NEGATIVE Final    Comment: (NOTE) SARS-CoV-2 target nucleic acids are NOT DETECTED. The SARS-CoV-2 RNA is generally detectable in upper and lower respiratory specimens during the acute phase of infection. Negative results do not preclude SARS-CoV-2 infection, do not rule out co-infections with other pathogens, and should not be used as the sole basis for treatment or other patient management decisions. Negative results must be combined with clinical observations, patient history, and epidemiological information. The expected result is Negative. Fact Sheet for Patients: SugarRoll.be Fact Sheet for Healthcare Providers: https://www.woods-mathews.com/ This test is not yet approved or cleared by the [place] FDA and  has been authorized for detection and/or diagnosis of  SARS-CoV-2 by FDA under an Emergency Use Authorization (EUA). This EUA will remain  in effect (meaning this test can be used) for the duration of the COVID-19 declaration under Section 56 4(b)(1) of the Act, 21 U.S.C. section 360bbb-3(b)(1), unless the authorization is terminated or revoked sooner. Performed at [facility], [place] [address] [place], [place] [address]          Radiology Studies: DG Chest Port 1 View  Result Date: 04/17/2019 CLINICAL DATA:  Weakness. Recent pneumonia. EXAM: PORTABLE CHEST 1 VIEW COMPARISON:  Radiograph 04/05/2019, CT 04/06/2019. Prior imaging at [facility]. FINDINGS: Cardiomegaly, similar to slightly progressed from prior. Bilateral pleural effusions with blunting of the costophrenic angles. Worsening interstitial opacities. No pneumothorax. Biapical pleuroparenchymal scarring. IMPRESSION: 1. Findings consistent with CHF. Slight worsening in pulmonary edema and cardiomegaly from prior. 2. Small pleural effusions are similar to prior on AP projection. Electronically Signed   By: Keith Rake M.D.   On: 04/17/2019 19:45        Scheduled Meds: Continuous Infusions:   LOS: 0 days    Time spent: 30 minutes    Alica Shellhammer Darleen Crocker, DO [facility] Pager [phone]  If 7PM-7AM, please contact night-coverage [email or web address] Password [facility] 04/18/2019, 11:44 AM

## 2019-04-18 NOTE — [place] Notes (Signed)
Patient confused, agitated and hallucinating.  Patient attempts to get oob without assistance.  Second daughter has called to speak to patient, patient refuses to speak with family. Will continue to monitor patient.

## 2019-04-18 NOTE — ED Notes (Addendum)
This nurse went to assess pt need for next available prn dose of medication. Pt was alert and oriented to self, place, situation and fairly close to time (pt states date was dec 2020). Pt daughter called during this time of assessment. Pt spoke with Joy who said she was "coming to get her mama and take her home." Pt told Daughter "No you aren't. I am staying here because I am scheduled to go to rehab next week." VSS at this time. Dr. Olevia Bowens made aware in improvement in pt condition. Family updated at pt request. Will continue to monitor.

## 2019-04-19 ENCOUNTER — Other Ambulatory Visit: Payer: Self-pay

## 2019-04-19 DIAGNOSIS — Z515 Encounter for palliative care: Secondary | ICD-10-CM

## 2019-04-19 DIAGNOSIS — R197 Diarrhea, unspecified: Secondary | ICD-10-CM

## 2019-04-19 DIAGNOSIS — Z7189 Other specified counseling: Secondary | ICD-10-CM

## 2019-04-19 MED ORDER — LORATADINE 10 MG PO TABS
10.0000 mg | ORAL_TABLET | Freq: Every evening | ORAL | Status: DC
  Administered 2019-04-19 – 2019-04-20 (×2): 10 mg via ORAL
  Filled 2019-04-19 (×2): qty 1

## 2019-04-19 MED ORDER — DICYCLOMINE HCL 10 MG PO CAPS: 10.0000 mg | ORAL_CAPSULE | ORAL | Status: DC | PRN

## 2019-04-19 MED ORDER — DILTIAZEM HCL ER 90 MG PO CP12
90.0000 mg | ORAL_CAPSULE | Freq: Two times a day (BID) | ORAL | Status: DC
  Administered 2019-04-19 – 2019-04-21 (×4): 90 mg via ORAL
  Filled 2019-04-19 (×11): qty 1

## 2019-04-19 MED ORDER — LEVOTHYROXINE SODIUM 25 MCG PO TABS
25.0000 ug | ORAL_TABLET | Freq: Every day | ORAL | Status: DC
  Administered 2019-04-20 – 2019-04-21 (×2): 25 ug via ORAL
  Filled 2019-04-19 (×2): qty 1

## 2019-04-19 MED ORDER — ENSURE ENLIVE PO LIQD
237.0000 mL | Freq: Two times a day (BID) | ORAL | Status: DC
  Administered 2019-04-19 – 2019-04-21 (×3): 237 mL via ORAL

## 2019-04-19 NOTE — [place] Notes (Addendum)
Patient daughter called to floor at 0500.  Staff nurse (not patient nurse) answered the phone and daughter and wanted information about patient.  Staff nurse looked up patient, and asked daughter if she was designated visitor.  Patient daughter Blanch Media) became very angry because staff nurse informed her that specific information could be given to patient because it would be a HIPPA violoation.  Patient daughter became very angry and irate and began yelling obscenities over phone.  Nursing supervisor made aware of patient behavior.  Patient is still stable and resting.

## 2019-04-19 NOTE — [place] Notes (Signed)
PROGRESS NOTE    [name]  [id number] DOB: 1929/03/24 DOA: 04/17/2019 PCP: [name], MD   Brief Narrative:  Per HPI: Renee Blake a 86 y.o.femalewith a history of acute blood loss anemia, atrial fibrillation, chronic diarrhea, history of GI bleed, hypertension, chronic kidney disease, gastric ulcer. Patient seen for hypotension over the past 12 hours that has been worsening. She has been having fairly chronic diarrhea that has been worsening.  Emergency Department Course: EDP had conversation with the patient after determining that the patient did not have capacity. Decision was made to make patient comfort measures only  1/17: Patient presented with some hypotension and was being gently hydrated, unfortunately it appeared that she also had some pulmonary edema and some shortness of breath.  EDP had discussed with family members further and they wanted to ensure that patient was comfortable and did not want any aggressive measures.  Fentanyl was given with some improvement.  This morning, family members are still confused and curious as to whether or not she is near end-of-life.  Blood pressures are improved this morning and palliative care consultation pending for a.m.  1/18: Patient is overall doing much better and is awake and alert this morning with no further diarrhea noted.  Blood pressures are stable.  Extensive discussion with daughter had and will have palliative evaluate patient today along with physical therapy.  They are requesting possible rehab placement short-term and then palliative evaluation for determination for hospice in the near future.  Assessment & Plan:   Active Problems:   Diarrhea   Malnutrition of moderate degree (HCC)   Generalized weakness   Acute exacerbation of CHF (congestive heart failure) (HCC)   DNR (do not resuscitate)   Hypotension   Diarrhea --Resolved, continue monitor  Hypotension likely secondary to  above -Currently resolved, continue monitoring -Resume home Cardizem  Hypothyroidism -Resume home levothyroxine  Generalized weakness with malnutrition of moderate degree -Questionable failure to thrive at home with multiple recent hospitalizations -Appreciate palliative care evaluation -Appreciate PT evaluation  Pulmonary edema with possible acute diastolic heart failure -Appears to be resolved as well -No further IV fluid and monitor daily weights  DNR on admission   DVT prophylaxis: Started on heparin Code Status: DNR Family Communication: Discussed with daughter Disposition Plan: Appreciate palliative and PT evaluation with likely need for rehabilitation on discharge and outpatient palliative evaluation.   Consultants:   Palliative care  Procedures:   None  Antimicrobials:   None   Subjective: Patient seen and evaluated today with no new acute complaints or concerns. No acute concerns or events noted overnight.  She is more awake and alert.  No further diarrhea noted.  Objective: Vitals:   04/18/19 1630 04/18/19 1751 04/18/19 2000 04/19/19 0551  BP: 92/72 (!) 144/73 (!) 178/100 (!) 158/87  Pulse:  66 99 84  Resp: (!) 22 20 20 19   Temp:  97.8 F (36.6 C) (!) 97.5 F (36.4 C) 98.1 F (36.7 C)  TempSrc:  Oral Oral Oral  SpO2:  96% 93% 97%  Weight:      Height:       No intake or output data in the 24 hours ending 04/19/19 1021 Filed Weights   04/17/19 1854  Weight: 51.7 kg    Examination:  General exam: Appears calm and comfortable  Respiratory system: Clear to auscultation. Respiratory effort normal.  Currently on nasal cannula. Cardiovascular system: S1 & S2 heard, RRR. No JVD, murmurs, rubs, gallops or clicks. No pedal  edema. Gastrointestinal system: Abdomen is nondistended, soft and nontender. No organomegaly or masses felt. Normal bowel sounds heard. Central nervous system: Alert and awake. Extremities: Symmetric 5 x 5 power. Skin: No  rashes, lesions or ulcers Psychiatry: Flat affect.    Data Reviewed: I have personally reviewed following labs and imaging studies  CBC: No results for input(s): WBC, NEUTROABS, HGB, HCT, MCV, PLT in the last 168 hours. Basic Metabolic Panel: No results for input(s): NA, K, CL, CO2, GLUCOSE, BUN, CREATININE, CALCIUM, MG, PHOS in the last 168 hours. GFR: CrCl cannot be calculated (Patient's most recent lab result is older than the maximum 21 days allowed.). Liver Function Tests: No results for input(s): AST, ALT, ALKPHOS, BILITOT, PROT, ALBUMIN in the last 168 hours. No results for input(s): LIPASE, AMYLASE in the last 168 hours. No results for input(s): AMMONIA in the last 168 hours. Coagulation Profile: No results for input(s): INR, PROTIME in the last 168 hours. Cardiac Enzymes: No results for input(s): CKTOTAL, CKMB, CKMBINDEX, TROPONINI in the last 168 hours. BNP (last 3 results) No results for input(s): PROBNP in the last 8760 hours. HbA1C: No results for input(s): HGBA1C in the last 72 hours. CBG: No results for input(s): GLUCAP in the last 168 hours. Lipid Profile: No results for input(s): CHOL, HDL, LDLCALC, TRIG, CHOLHDL, LDLDIRECT in the last 72 hours. Thyroid Function Tests: No results for input(s): TSH, T4TOTAL, FREET4, T3FREE, THYROIDAB in the last 72 hours. Anemia Panel: No results for input(s): VITAMINB12, FOLATE, FERRITIN, TIBC, IRON, RETICCTPCT in the last 72 hours. Sepsis Labs: No results for input(s): PROCALCITON, LATICACIDVEN in the last 168 hours.  Recent Results (from the past 240 hour(s))  SARS CORONAVIRUS 2 (TAT 6-24 HRS) Nasopharyngeal Nasopharyngeal Swab     Status: None   Collection Time: 04/17/19  7:38 PM   Specimen: Nasopharyngeal Swab  Result Value Ref Range Status   SARS Coronavirus 2 NEGATIVE NEGATIVE Final    Comment: (NOTE) SARS-CoV-2 target nucleic acids are NOT DETECTED. The SARS-CoV-2 RNA is generally detectable in upper and  lower respiratory specimens during the acute phase of infection. Negative results do not preclude SARS-CoV-2 infection, do not rule out co-infections with other pathogens, and should not be used as the sole basis for treatment or other patient management decisions. Negative results must be combined with clinical observations, patient history, and epidemiological information. The expected result is Negative. Fact Sheet for Patients: SugarRoll.be Fact Sheet for Healthcare Providers: https://www.woods-mathews.com/ This test is not yet approved or cleared by the [place] FDA and  has been authorized for detection and/or diagnosis of SARS-CoV-2 by FDA under an Emergency Use Authorization (EUA). This EUA will remain  in effect (meaning this test can be used) for the duration of the COVID-19 declaration under Section 56 4(b)(1) of the Act, 21 U.S.C. section 360bbb-3(b)(1), unless the authorization is terminated or revoked sooner. Performed at [facility], [place] [address] [place], [place] [address]          Radiology Studies: DG Chest Port 1 View  Result Date: 04/17/2019 CLINICAL DATA:  Weakness. Recent pneumonia. EXAM: PORTABLE CHEST 1 VIEW COMPARISON:  Radiograph 04/05/2019, CT 04/06/2019. [facility] at [facility]. FINDINGS: Cardiomegaly, similar to slightly progressed from prior. Bilateral pleural effusions with blunting of the costophrenic angles. Worsening interstitial opacities. No pneumothorax. Biapical pleuroparenchymal scarring. IMPRESSION: 1. Findings consistent with CHF. Slight worsening in pulmonary edema and cardiomegaly from prior. 2. Small pleural effusions are similar to prior on AP projection. Electronically Signed  By: Keith Rake M.D.   On: 04/17/2019 19:45        Scheduled Meds: . diltiazem  90 mg Oral BID  . [START ON 04/20/2019] levothyroxine  25 mcg Oral QAC breakfast  . loratadine  10 mg Oral QPM    Continuous Infusions:   LOS: 1 day    Time spent: 30 minutes    Martika Egler Darleen Crocker, DO [facility] Pager [phone]  If 7PM-7AM, please contact night-coverage [email or web address] Password [facility] 04/19/2019, 10:21 AM

## 2019-04-19 NOTE — [place] Notes (Signed)
Patient daughters have called several times during night.  All questions from patient daughters have been answered.  Patient's daughters seem to have different opinions and ideas of patient's care.

## 2019-04-19 NOTE — [place] Notes (Signed)
Patient awake and alert this am.  Dialed Blanch Media number (that she gave to staff last night) and Blanch Media did not answer.  Granddaughter Christi answered the phone. Christi spoke to patient.  Patient engaged in conversation.  Christi stated she did not understand the treatment plan for patient either.  Informed granddaughter that that would be a conversation between family and doctor.

## 2019-04-19 NOTE — NC FL2 (Signed)
[place] LEVEL OF CARE SCREENING TOOL     IDENTIFICATION  Patient Name: Renee Blake Birthdate: 03-07-1929 Sex: female Admission Date (Current Location): 04/17/2019  [facility] and [place] Number:  [employer] and Address:  [place] [address], [place]      Provider Number: [id number]  Attending Physician Name and Address:  Rodena Goldmann, DO  Relative Name and Phone Number:  Gershon Mussel - daughter  [phone]    Current Level of Care: Hospital Recommended Level of Care: [place] Prior Approval Number:    Date Approved/Denied:   PASRR Number: [id number]  Discharge Plan: SNF    Current Diagnoses: Patient Active Problem List   Diagnosis Date Noted  . Goals of care, counseling/discussion   . Palliative care encounter   . Hypotension 04/17/2019  . Debility 03/16/2019  . Shortness of breath 03/16/2019  . Cachexia ([place]) 03/16/2019  . Permanent atrial fibrillation ([place])   . Hypoxia 03/15/2019  . CHF exacerbation ([place]) 03/14/2019  . Bilateral pleural effusion 03/04/2019  . Chronic atrial fibrillation ([place]) 03/04/2019  . DNR (do not resuscitate) 03/04/2019  . Acute on chronic diastolic congestive heart failure ([place]) 03/02/2019  . Chronic diastolic heart failure ([place]) 03/02/2019  . Chronic renal failure, stage 3b 03/02/2019  . Acute respiratory failure with hypoxia ([place]) 03/02/2019  . Acute exacerbation of CHF (congestive heart failure) ([place]) 01/27/2019  . Atrial fibrillation with RVR ([place]) 01/27/2019  . Iron deficiency anemia due to chronic blood loss---H/o Gi Bleed 01/27/2019  . Vaginal mass 07/29/2017  . Heme positive stool 07/25/2017  . Cellulitis 12/10/2016  . Facial cellulitis   . Erroneous encounter - disregard 03/17/2015  . AKI (acute kidney injury) ([place])   . Metabolic acidosis 0000000  . Acute renal failure syndrome ([place])   . Renal failure 08/22/2014  . Hyperkalemia  08/22/2014  . UTI (urinary tract infection) 08/22/2014  . Acute renal failure ([place]) 08/22/2014  . Dyspnea 03/15/2014  . Diabetes mellitus ([place]) 06/08/2013  . Acute diastolic congestive heart failure ([place]) 06/07/2013  . Syncope 03/26/2013  . Rectal bleeding 03/26/2013  . Dehydration 03/26/2013  . Generalized weakness 03/26/2013  . Malnutrition of moderate degree ([place]) 03/01/2013  . Diarrhea 02/28/2013  . Loss of weight 02/28/2013  . H/o Prior duodenal ulcer with hemorrhage 02/27/2013  . H/o Prior Lower GI bleed (anorectal Ca) 02/26/2013  . Acute blood loss anemia 02/26/2013  . UTI (lower urinary tract infection) 02/26/2013  . Hypertension 02/26/2013    Orientation RESPIRATION BLADDER Height & Weight     Self, Time, Situation, Place  Normal Incontinent Weight: 51.7 kg Height:  5\' 4"  (162.6 cm)  BEHAVIORAL SYMPTOMS/MOOD NEUROLOGICAL BOWEL NUTRITION STATUS      Continent Diet(see DC summary)  AMBULATORY STATUS COMMUNICATION OF NEEDS Skin   Extensive Assist Verbally Other (Comment)(skin tear left leg)                       Personal Care Assistance Level of Assistance  Bathing, Feeding, Dressing Bathing Assistance: Maximum assistance Feeding assistance: Independent Dressing Assistance: Limited assistance     Functional Limitations Info  Sight, Hearing, Speech Sight Info: Impaired Hearing Info: Adequate Speech Info: Adequate    SPECIAL CARE FACTORS FREQUENCY  PT (By licensed PT)     PT Frequency: 5 times a week.              Contractures Contractures Info: Not present  Additional Factors Info  Code Status, Allergies Code Status Info: DNR Allergies Info: clonazepam, codeine, sulfa           Current Medications (04/19/2019):  This is the current hospital active medication list Current Facility-Administered Medications  Medication Dose Route Frequency Provider Last Rate Last Admin  . acetaminophen (TYLENOL) tablet 650 mg  650 mg Oral Q6H PRN Truett Mainland, DO       Or  . acetaminophen (TYLENOL) suppository 650 mg  650 mg Rectal Q6H PRN Truett Mainland, DO      . dicyclomine (BENTYL) capsule 10 mg  10 mg Oral Q4H PRN Manuella Ghazi, Pratik D, DO      . diltiazem (CARDIZEM SR) 12 hr capsule 90 mg  90 mg Oral BID Manuella Ghazi, Pratik D, DO      . diphenhydrAMINE (BENADRYL) injection 12.5 mg  12.5 mg Intravenous Q4H PRN Truett Mainland, DO      . feeding supplement (ENSURE ENLIVE) (ENSURE ENLIVE) liquid 237 mL  237 mL Oral BID BM Vinie Sill C, NP   [id number] mL at 04/19/19 1455  . glycopyrrolate (ROBINUL) tablet 1 mg  1 mg Oral Q4H PRN Truett Mainland, DO       Or  . glycopyrrolate (ROBINUL) injection 0.2 mg  0.2 mg Subcutaneous Q4H PRN Truett Mainland, DO       Or  . glycopyrrolate (ROBINUL) injection 0.2 mg  0.2 mg Intravenous Q4H PRN Truett Mainland, DO   0.2 mg at 04/17/19 2359  . haloperidol (HALDOL) tablet 0.5 mg  0.5 mg Oral Q4H PRN Truett Mainland, DO       Or  . haloperidol (HALDOL) 2 MG/ML solution 0.5 mg  0.5 mg Sublingual Q4H PRN Truett Mainland, DO       Or  . haloperidol lactate (HALDOL) injection 0.5 mg  0.5 mg Intravenous Q4H PRN Truett Mainland, DO   0.5 mg at 04/18/19 2114  . [START ON 04/20/2019] levothyroxine (SYNTHROID) tablet 25 mcg  25 mcg Oral QAC breakfast Manuella Ghazi, Pratik D, DO      . loperamide (IMODIUM) capsule 2 mg  2 mg Oral Q2H PRN Truett Mainland, DO      . loratadine (CLARITIN) tablet 10 mg  10 mg Oral QPM Shah, Pratik D, DO   10 mg at 04/19/19 1632  . LORazepam (ATIVAN) tablet 1 mg  1 mg Oral Q4H PRN Truett Mainland, DO       Or  . LORazepam (ATIVAN) 2 MG/ML concentrated solution 1 mg  1 mg Sublingual Q4H PRN Truett Mainland, DO       Or  . LORazepam (ATIVAN) injection 1 mg  1 mg Intravenous Q4H PRN Truett Mainland, DO   1 mg at 04/18/19 2043  . morphine CONCENTRATE 10 MG/0.5ML oral solution 5 mg  5 mg Oral Q2H PRN Truett Mainland, DO   5 mg at 04/18/19 0309   Or  . morphine CONCENTRATE 10 MG/0.5ML oral solution 5  mg  5 mg Sublingual Q2H PRN Truett Mainland, DO      . ondansetron (ZOFRAN-ODT) disintegrating tablet 4 mg  4 mg Oral Q6H PRN Truett Mainland, DO       Or  . ondansetron [facility]) injection 4 mg  4 mg Intravenous Q6H PRN Truett Mainland, DO         Discharge Medications: Please see discharge summary for a list of discharge medications.  Relevant Imaging  Results:  Relevant Lab Results:   Additional Information SS# [id number]  Boneta Lucks, RN

## 2019-04-19 NOTE — Consult Note (Signed)
Consultation Note Date: 04/19/2019   Patient Name: Renee Blake  DOB: [birth year]  MRN: [id number]  Age / Sex: 86 y.o., female  PCP: [name], MD Referring Physician: Rodena Goldmann, DO  Reason for Consultation: Establishing goals of care and Hospice Evaluation  HPI/Patient Profile: 86 y.o. female  with past medical history of persistent atrial fibrillation (no anticoagulation d/t history GI bleeds/anemia), diastolic CHF, mod-severe mitral regurgitation, HTN, HLD, CKD, chronic diarrhea, failure to thrive admitted on 04/17/2019 with unresponsive, hypotensive, and copious diarrhea. Recent admission at [facility] for pneumonia/atrial fibrillation per family report. Per notes, patient was made comfort measures at time of admission. She has had some level of improvement in mental status during hospitalization but has had dehydration s/t diarrhea with pulmonary edema at the same time. There appears to be confusion and differing opinions regarding goals of care with daughters.   Clinical Assessment and Goals of Care: I met today with Ms. Mcenery and her daughter, [name]. Ms. Stuckert is alert and oriented and eats a good portion of her lunch during my visit. They both express desire to try rehab again and are interested in [facility]. She was reportedly eating well and supplementing with Boost at home. They were monitoring weights and fluids restrictions. She was getting around with some assistance at home. [name] reports that she did not have diarrhea until her "episode." They report that she initially had some pain in her legs and then had abd pain and SOB and hot flash prior to copious diarrhea and unconsciousness. Denies nausea, vomiting, swallowing difficulty or chest pain. She reports that she felt like this previously rubbed her anterior throat and felt better?? Unclear what triggered the event.   Upon further  conversation regarding goals of care they report that they recently completed Advance Directive and I made a copy and placed on chart. Daughter, [name], is primary HCPOA with daughter, Marlowe Kays, as secondary. Ms. Lamson is very clear about DNR status and that she does not desire aggressive or invasive measures (no HD) but is unsure if she would desire rehospitalization versus focus on comfort measures if she declines again. I encourage them to continue to discuss wishes and for now we will await to see if she is a candidate for rehab. They understand that she may very well be stuck in this cycle where she gains strength and does well until she is readmitted to the hospital and that there is a chance this will not change. Ms. Lahm is contemplating her focus of care into the future but does wish to pursue rehab at current time.   All questions/concerns addressed. Emotional support provided.   Primary Decision Maker HCPOA daughter [name] present at bedside    SUMMARY OF RECOMMENDATIONS   - Hopeful for rehab  Code Status/Advance Care Planning:  DNR   Symptom Management:   Per attending.   Additional Recommendations (Limitations, Scope, Preferences):  No Artificial Feeding, No Hemodialysis and No Tracheostomy  Psycho-social/Spiritual:   Desire for further Chaplaincy support:no  Additional  Recommendations: Caregiving  Support/Resources and Education on Hospice  Prognosis:   Overall prognosis difficult to determine but she does often have acute and sudden decline requiring readmission. If she opted for more comfort focused care and did not wish for rehospitalization she would be a candidate for hospice.   Discharge Planning: [place] for rehab with Palliative care service follow-up      Primary Diagnoses: Present on Admission: . Hypotension . Diarrhea . DNR (do not resuscitate) . Malnutrition of moderate degree ([place])   I have reviewed the medical record, interviewed  the patient and family, and examined the patient. The following aspects are pertinent.  Past Medical History:  Diagnosis Date  . Acute blood loss anemia   . Atrial fibrillation ([place])    a. diagnosed in 12/2018. Rate-control pursued given not a candidate for anticoagulation  . Chronic diarrhea   . Dilation of biliary tract   . Duodenal stricture   . Esophageal stricture   . Gastric ulcer   . GI bleed   . Hyperlipemia   . Hypertension   . Microscopic colitis   . Nephrolithiasis   . Pancreatic duct dilated   . Schatzki's ring   . Skin cancer   . Sliding hiatal hernia   . Vertigo    Social History   Socioeconomic History  . Marital status: Widowed    Spouse name: Not on file  . Number of children: Not on file  . Years of education: Not on file  . Highest education level: Not on file  Occupational History  . Not on file  Tobacco Use  . Smoking status: Never Smoker  . Smokeless tobacco: Never Used  Substance and Sexual Activity  . Alcohol use: No    Alcohol/week: 0.0 standard drinks  . Drug use: No  . Sexual activity: Not on file  Other Topics Concern  . Not on file  Social History Narrative  . Not on file   Social Determinants of Health   Financial Resource Strain:   . Difficulty of Paying Living Expenses: Not on file  Food Insecurity:   . Worried About Charity fundraiser in the Last Year: Not on file  . Ran Out of Food in the Last Year: Not on file  Transportation Needs:   . Lack of Transportation (Medical): Not on file  . Lack of Transportation (Non-Medical): Not on file  Physical Activity:   . Days of Exercise per Week: Not on file  . Minutes of Exercise per Session: Not on file  Stress:   . Feeling of Stress : Not on file  Social Connections:   . Frequency of Communication with Friends and Family: Not on file  . Frequency of Social Gatherings with Friends and Family: Not on file  . Attends Religious Services: Not on file  . Active Member of Clubs or  Organizations: Not on file  . Attends Archivist Meetings: Not on file  . Marital Status: Not on file   Family History  Problem Relation Age of Onset  . Heart attack Father    Scheduled Meds: . diltiazem  90 mg Oral BID  . [START ON 04/20/2019] levothyroxine  25 mcg Oral QAC breakfast  . loratadine  10 mg Oral QPM   Continuous Infusions: PRN Meds:.acetaminophen **OR** acetaminophen, dicyclomine, diphenhydrAMINE, glycopyrrolate **OR** glycopyrrolate **OR** glycopyrrolate, haloperidol **OR** haloperidol **OR** haloperidol lactate, loperamide, LORazepam **OR** LORazepam **OR** LORazepam, morphine CONCENTRATE **OR** morphine CONCENTRATE, ondansetron **OR** ondansetron (ZOFRAN) IV Allergies  Allergen  Reactions  . Clonazepam Other (See Comments)    Pruritis  . Codeine Nausea And Vomiting and Other (See Comments)    Reaction unknown  . Sulfa Antibiotics Other (See Comments)    Reactions unknown   Review of Systems  Constitutional: Positive for activity change. Negative for appetite change.  HENT: Negative for trouble swallowing.   Respiratory: Negative for shortness of breath.   Cardiovascular: Negative for chest pain.  Neurological: Positive for weakness. Negative for dizziness and light-headedness.    Physical Exam Vitals and nursing note reviewed.  Constitutional:      General: She is not in acute distress.    Comments: Frail, elderly  Cardiovascular:     Rate and Rhythm: Normal rate.  Pulmonary:     Effort: No tachypnea, accessory muscle usage or respiratory distress.  Abdominal:     General: Abdomen is flat.  Neurological:     Mental Status: She is alert and oriented to person, place, and time.     Vital Signs: BP (!) 158/87 (BP Location: Right Arm)   Pulse 84   Temp 98.1 F (36.7 C) (Oral)   Resp 19   Ht 5' 4"  (1.626 m)   Wt 51.7 kg   SpO2 97%   BMI 19.57 kg/m  Pain Scale: 0-10   Pain Score: 0-No pain   SpO2: SpO2: 97 % O2 Device:SpO2: 97 % O2  Flow Rate: .O2 Flow Rate (L/min): 2 L/min  IO: Intake/output summary: No intake or output data in the 24 hours ending 04/19/19 0951  LBM:   Baseline Weight: Weight: 51.7 kg Most recent weight: Weight: 51.7 kg     Palliative Assessment/Data:     Time In: 1130 Time Out: 1230 Time Total: 60 min Greater than 50%  of this time was spent counseling and coordinating care related to the above assessment and plan.  Signed by: Vinie Sill, NP Palliative Medicine Team Pager # [phone] (M-F 8a-5p) Team Phone # [phone] (Nights/Weekends)

## 2019-04-19 NOTE — Progress Notes (Addendum)
Patient daughter Blanch Media has called several times during the shift.  Patient daughter does not seem to be agreeable to patient current care plan ( palliative care).  Patient has asked several questions about patient's care, which have been answered.  Patient daughter seems to asks the same questions several times in different ways, and seems to have difficulty understanding/comprehending responses from nursing staff.  Patient  Daughter is erratic, passive-aggressive in conversation and very emotionally labile.  Because of multiple calls, explained to patient daughter that it would be easier, between the siblings, to have one person be responsible for phones calls, so relaying of information would be clear and concise. Patient states that would be difficult because she states," No one talks to her." Again reiterated to daughter that patient was currently stable and resting.

## 2019-04-20 ENCOUNTER — Other Ambulatory Visit: Payer: Self-pay

## 2019-04-20 ENCOUNTER — Encounter (HOSPITAL_COMMUNITY): Payer: Self-pay | Admitting: Internal Medicine

## 2019-04-20 LAB — BASIC METABOLIC PANEL
Anion gap: 8 (ref 5–15)
BUN: 26 mg/dL — ABNORMAL HIGH (ref 8–23)
CO2: 21 mmol/L — ABNORMAL LOW (ref 22–32)
Calcium: 8.5 mg/dL — ABNORMAL LOW (ref 8.9–10.3)
Chloride: 111 mmol/L (ref 98–111)
Creatinine, Ser: 1.14 mg/dL — ABNORMAL HIGH (ref 0.44–1.00)
GFR calc Af Amer: 50 mL/min — ABNORMAL LOW (ref >60)
GFR calc non Af Amer: 43 mL/min — ABNORMAL LOW (ref >60)
Glucose, Bld: 93 mg/dL (ref 70–99)
Potassium: 3.4 mmol/L — ABNORMAL LOW (ref 3.5–5.1)
Sodium: 140 mmol/L (ref 135–145)

## 2019-04-20 LAB — CBC
HCT: 28.6 % — ABNORMAL LOW (ref 36.0–46.0)
Hemoglobin: 8.6 g/dL — ABNORMAL LOW (ref 12.0–15.0)
MCH: 28.1 pg (ref 26.0–34.0)
MCHC: 30.1 g/dL (ref 30.0–36.0)
MCV: 93.5 fL (ref 80.0–100.0)
Platelets: 232 10*3/uL (ref 150–400)
RBC: 3.06 MIL/uL — ABNORMAL LOW (ref 3.87–5.11)
RDW: 16.1 % — ABNORMAL HIGH (ref 11.5–15.5)
WBC: 6.3 10*3/uL (ref 4.0–10.5)
nRBC: 0 % (ref 0.0–0.2)

## 2019-04-20 LAB — MAGNESIUM: Magnesium: 1.9 mg/dL (ref 1.7–2.4)

## 2019-04-20 LAB — SARS CORONAVIRUS 2 (TAT 6-24 HRS): SARS Coronavirus 2: NEGATIVE

## 2019-04-20 MED ORDER — ALPRAZOLAM 0.25 MG PO TABS: 0.2500 mg | ORAL_TABLET | Freq: Two times a day (BID) | ORAL | 0 refills | Status: DC | PRN

## 2019-04-20 NOTE — Progress Notes (Signed)
  Reviewed List with [name]- daughter  [facility] [address] [place] [address] [phone] Overall rating Below average 2. 0.5 mi [facility] [address] [place], [place] [address] [phone] Overall rating Much above average 3. 10.9 mi [place], [place] [address] [phone] Overall rating Above average 4. 12.8 mi [place], [place] [address] [phone] Overall rating Average 5. [place], [place] [address] [phone] Overall rating Much below average 6. 17.8 mi [place] [address] [place], [place] [address] [phone] Overall rating Above average 7. 17.9 mi [facility] [place], [place] [address] [phone] Overall rating Much below average 8. [place], [place] [address] [phone] Overall rating Much below average 9. 18.4 mi [place] [address] [place], [place] [address] [phone] Overall rating Average 10. 18.8 mi [place] [facility] [address] [place], [place] [address] [phone] Overall rating Below average 11. [place], [place] [address] [phone] Overall rating Much below average 12. 19.8 mi [place] [facility] [place], [place] [address] [phone] Overall rating Much above average 13. [place], [place] [address] [phone] Overall rating Much above average 14. 20.8 mi [place] at [employer] at [facility],  [address] [phone] Overall rating Much above average 15. 20.9 [place] [address] [place],  [address] [phone] Overall rating Much below average <Previous 1 2 3

## 2019-04-20 NOTE — Discharge Summary (Signed)
Physician Discharge Summary  Renee Blake [id number] DOB: [birth year] DOA: 04/17/2019  PCP: [name], MD  Admit date: 04/17/2019  Discharge date: 04/20/2019  Admitted From:Home  Disposition:  SNF  Recommendations for Outpatient Follow-up:  1. Follow up with PCP in 1-2 weeks 2. Recommend outpatient palliative care/hospice follow-up for determination of patient is ready for hospice care 3. Continue prior home medications to include Xanax as needed for anxiety  Home Health: None  Equipment/Devices: None  Discharge Condition: Stable  CODE STATUS: DNR  Diet recommendation: Heart Healthy  Brief/Interim Summary: Per HPI: Renee Blake a 86 y.o.femalewith a history of acute blood loss anemia, atrial fibrillation, chronic diarrhea, history of GI bleed, hypertension, chronic kidney disease, gastric ulcer. Patient seen for hypotension over the past 12 hours that has been worsening. She has been having fairly chronic diarrhea that has been worsening.  Emergency Department Course: EDP had conversation with the patient after determining that the patient did not have capacity. Decision was made to make patient comfort measures only  1/17:Patient presented with some hypotension and was being gently hydrated, unfortunately it appeared that she also had some pulmonary edema and some shortness of breath. EDP had discussed with family members further and they wanted to ensure that patient was comfortable and did not want any aggressive measures. Fentanyl was given with some improvement. This morning, family members are still confused and curious as to whether or not she is near end-of-life. Blood pressures are improved this morning and palliative care consultation pending for a.m.  1/18: Patient is overall doing much better and is awake and alert this morning with no further diarrhea noted.  Blood pressures are stable.  Extensive discussion with daughter had and will have  palliative evaluate patient today along with physical therapy.  They are requesting possible rehab placement short-term and then palliative evaluation for determination for hospice in the near future.  1/19: Overall patient has been in stable condition during this hospitalization with no further episodes of hypotension or diarrhea.  She does have generalized weakness with malnutrition and has had recurrent hospitalizations prompting palliative evaluation.  She does appear to be nearing end-of-life, but given her clinical course during this hospitalization, it seems best at this time to trial rehab for short period of time to see whether or not there is any improvement in her weakness and malnutrition.  If she continues to have steady decline, palliative would be recommended at that time.  Her long-term prognosis is overall poor.  No other acute events noted during this hospitalization.  She still remains somewhat confused, but this is not too far from her baseline.  She does not warrant any head imaging as she does not appear to have any speech or swallow deficits or other neurological deficits on exam.  Discharge Diagnoses:  Active Problems:   Diarrhea   Malnutrition of moderate degree ([facility])   Generalized weakness   Acute exacerbation of CHF (congestive heart failure) ([place])   DNR (do not resuscitate)   Hypotension   Goals of care, counseling/discussion   Palliative care encounter  Principal discharge diagnosis: Severe hypotension secondary to volume depletion and diarrhea.  Generalized weakness with malnutrition and failure to thrive.  Discharge Instructions  Discharge Instructions    Diet - low sodium heart healthy   Complete by: As directed    Increase activity slowly   Complete by: As directed      Allergies as of 04/20/2019      Reactions  Clonazepam Other (See Comments)   Pruritis   Codeine Nausea And Vomiting, Other (See Comments)   Reaction unknown   Sulfa Antibiotics Other  (See Comments)   Reactions unknown      Medication List    TAKE these medications   ALPRAZolam 0.25 MG tablet Commonly known as: XANAX Take 1 tablet (0.25 mg total) by mouth 2 (two) times daily as needed. for anxiety   amiodarone 200 MG tablet Commonly known as: PACERONE Take 200 mg by mouth 2 (two) times daily.   dicyclomine 10 MG capsule Commonly known as: BENTYL Take 10 mg by mouth every 4 (four) hours as needed for spasms.   diltiazem 90 MG 12 hr capsule Commonly known as: CARDIZEM SR Take 1 capsule (90 mg total) by mouth 2 (two) times daily.   furosemide 40 MG tablet Commonly known as: LASIX Take 60 mg by mouth. Taking 40 mg in am, and 20 mg every evening   levocetirizine 5 MG tablet Commonly known as: XYZAL Take 5 mg by mouth every evening.   levothyroxine 25 MCG tablet Commonly known as: SYNTHROID Take 25 mcg by mouth daily before breakfast.   meclizine 25 MG tablet Commonly known as: ANTIVERT Take 25 mg by mouth daily as needed for dizziness.   metoprolol succinate 50 MG 24 hr tablet Commonly known as: TOPROL-XL Take 1 tablet (50 mg total) by mouth every morning. Take with or immediately following a meal.   multivitamin with minerals Tabs tablet Take 1 tablet by mouth daily.   potassium chloride SA 20 MEQ tablet Commonly known as: KLOR-CON Take 20 mEq by mouth daily.   vitamin B-12 500 MCG tablet Commonly known as: CYANOCOBALAMIN Take 1 tablet (500 mcg total) by mouth daily.   Zofran 4 MG tablet Generic drug: ondansetron Take 4 mg by mouth every 8 (eight) hours as needed for nausea or vomiting.      Follow-up Information    [name], MD Follow up in 1 week(s).   Specialty: Internal Medicine Contact information: [place] [address] [phone]          Allergies  Allergen Reactions  . Clonazepam Other (See Comments)    Pruritis  . Codeine Nausea And Vomiting and Other (See Comments)    Reaction unknown  .  Sulfa Antibiotics Other (See Comments)    Reactions unknown    Consultations:  None   Procedures/Studies: DG Chest Port 1 View  Result Date: 04/17/2019 CLINICAL DATA:  Weakness. Recent pneumonia. EXAM: PORTABLE CHEST 1 VIEW COMPARISON:  Radiograph 04/05/2019, CT 04/06/2019. [facility] at [facility]. FINDINGS: Cardiomegaly, similar to slightly progressed from prior. Bilateral pleural effusions with blunting of the costophrenic angles. Worsening interstitial opacities. No pneumothorax. Biapical pleuroparenchymal scarring. IMPRESSION: 1. Findings consistent with CHF. Slight worsening in pulmonary edema and cardiomegaly from prior. 2. Small pleural effusions are similar to prior on AP projection. Electronically Signed   By: Keith Rake M.D.   On: 04/17/2019 19:45     Discharge Exam: Vitals:   04/19/19 2119 04/20/19 0500  BP: (!) 154/86 134/85  Pulse: 97 73  Resp: 19 20  Temp: 98.5 F (36.9 C) 97.9 F (36.6 C)  SpO2: 97% 100%   Vitals:   04/19/19 2048 04/19/19 2058 04/19/19 2119 04/20/19 0500  BP:  (!) 161/88 (!) 154/86 134/85  Pulse:  (!) 101 97 73  Resp:  19 19 20   Temp:  98.3 F (36.8 C) 98.5 F (36.9 C) 97.9  F (36.6 C)  TempSrc:  Oral Oral Oral  SpO2: 97% 98% 97% 100%  Weight:      Height:        General: Pt is alert, awake, not in acute distress, appears somewhat confused and does not oriented to place or time. Cardiovascular: RRR, S1/S2 +, no rubs, no gallops Respiratory: CTA bilaterally, no wheezing, no rhonchi, currently on nasal cannula oxygen Abdominal: Soft, NT, ND, bowel sounds + Extremities: no edema, no cyanosis    The results of significant diagnostics from this hospitalization (including imaging, microbiology, ancillary and laboratory) are listed below for reference.     Microbiology: Recent Results (from the past 240 hour(s))  SARS CORONAVIRUS 2 (TAT 6-24 HRS) Nasopharyngeal Nasopharyngeal Swab     Status: None   Collection Time:  04/17/19  7:38 PM   Specimen: Nasopharyngeal Swab  Result Value Ref Range Status   SARS Coronavirus 2 NEGATIVE NEGATIVE Final    Comment: (NOTE) SARS-CoV-2 target nucleic acids are NOT DETECTED. The SARS-CoV-2 RNA is generally detectable in upper and lower respiratory specimens during the acute phase of infection. Negative results do not preclude SARS-CoV-2 infection, do not rule out co-infections with other pathogens, and should not be used as the sole basis for treatment or other patient management decisions. Negative results must be combined with clinical observations, patient history, and epidemiological information. The expected result is Negative. Fact Sheet for Patients: SugarRoll.be Fact Sheet for Healthcare Providers: https://www.woods-mathews.com/ This test is not yet approved or cleared by the [place] FDA and  has been authorized for detection and/or diagnosis of SARS-CoV-2 by FDA under an Emergency Use Authorization (EUA). This EUA will remain  in effect (meaning this test can be used) for the duration of the COVID-19 declaration under Section 56 4(b)(1) of the Act, 21 U.S.C. section 360bbb-3(b)(1), unless the authorization is terminated or revoked sooner. Performed at [facility], [place] [address], [place], [place] [address]      Labs: BNP (last 3 results) Recent Labs    03/13/19 2137 03/16/19 0421 03/17/19 0427  BNP 478.0* 152.0* [id number]*   Basic Metabolic Panel: Recent Labs  Lab 04/20/19 0429  NA 140  K 3.4*  CL 111  CO2 21*  GLUCOSE 93  BUN 26*  CREATININE 1.14*  CALCIUM 8.5*  MG 1.9   Liver Function Tests: No results for input(s): AST, ALT, ALKPHOS, BILITOT, PROT, ALBUMIN in the last 168 hours. No results for input(s): LIPASE, AMYLASE in the last 168 hours. No results for input(s): AMMONIA in the last 168 hours. CBC: Recent Labs  Lab 04/20/19 0429  WBC 6.3  HGB 8.6*  HCT 28.6*  MCV 93.5   PLT 232   Cardiac Enzymes: No results for input(s): CKTOTAL, CKMB, CKMBINDEX, TROPONINI in the last 168 hours. BNP: Invalid input(s): POCBNP CBG: No results for input(s): GLUCAP in the last 168 hours. D-Dimer No results for input(s): DDIMER in the last 72 hours. Hgb A1c No results for input(s): HGBA1C in the last 72 hours. Lipid Profile No results for input(s): CHOL, HDL, LDLCALC, TRIG, CHOLHDL, LDLDIRECT in the last 72 hours. Thyroid function studies No results for input(s): TSH, T4TOTAL, T3FREE, THYROIDAB in the last 72 hours.  Invalid input(s): FREET3 Anemia work up No results for input(s): VITAMINB12, FOLATE, FERRITIN, TIBC, IRON, RETICCTPCT in the last 72 hours. Urinalysis    Component Value Date/Time   COLORURINE YELLOW 01/27/2019 1533   APPEARANCEUR CLOUDY (A) 01/27/2019 1533   LABSPEC 1.005 01/27/2019 1533  PHURINE 7.0 01/27/2019 1533   GLUCOSEU NEGATIVE 01/27/2019 1533   HGBUR SMALL (A) 01/27/2019 1533   BILIRUBINUR NEGATIVE 01/27/2019 1533   [place] 01/27/2019 1533   PROTEINUR NEGATIVE 01/27/2019 1533   UROBILINOGEN 0.2 08/22/2014 1300   NITRITE NEGATIVE 01/27/2019 1533   LEUKOCYTESUR LARGE (A) 01/27/2019 1533   Sepsis Labs Invalid input(s): PROCALCITONIN,  WBC,  LACTICIDVEN Microbiology Recent Results (from the past 240 hour(s))  SARS CORONAVIRUS 2 (TAT 6-24 HRS) Nasopharyngeal Nasopharyngeal Swab     Status: None   Collection Time: 04/17/19  7:38 PM   Specimen: Nasopharyngeal Swab  Result Value Ref Range Status   SARS Coronavirus 2 NEGATIVE NEGATIVE Final    Comment: (NOTE) SARS-CoV-2 target nucleic acids are NOT DETECTED. The SARS-CoV-2 RNA is generally detectable in upper and lower respiratory specimens during the acute phase of infection. Negative results do not preclude SARS-CoV-2 infection, do not rule out co-infections with other pathogens, and should not be used as the sole basis for treatment or other patient management  decisions. Negative results must be combined with clinical observations, patient history, and epidemiological information. The expected result is Negative. Fact Sheet for Patients: SugarRoll.be Fact Sheet for Healthcare Providers: https://www.woods-mathews.com/ This test is not yet approved or cleared by the [place] FDA and  has been authorized for detection and/or diagnosis of SARS-CoV-2 by FDA under an Emergency Use Authorization (EUA). This EUA will remain  in effect (meaning this test can be used) for the duration of the COVID-19 declaration under Section 56 4(b)(1) of the Act, 21 U.S.C. section 360bbb-3(b)(1), unless the authorization is terminated or revoked sooner. Performed at [facility], [place] [address], [place], [place] [address]      Time coordinating discharge: 35 minutes  SIGNED:   Rodena Goldmann, DO [facility] 04/20/2019, 10:28 AM  If 7PM-7AM, please contact night-coverage [email or web address]

## 2019-04-20 NOTE — Evaluation (Signed)
Physical Therapy Evaluation Patient Details Name: Renee Blake MRN: [id number] DOB: February 01, 1929 Today's Date: 04/20/2019   History of Present Illness  Renee Blake is a 86 y.o. female with a history of acute blood loss anemia, atrial fibrillation, chronic diarrhea, history of GI bleed, hypertension, chronic kidney disease, gastric ulcer.  Patient seen for hypotension over the past 12 hours that has been worsening.  She has been having fairly chronic diarrhea that has been worsening.    Clinical Impression  Patient limited for functional mobility as stated below secondary to BLE weakness, fatigue and poor standing balance. Patient requires mod assist for bed mobility and max assist to transfer to standing and remain standing. She tolerates sitting EOB well without additional support. She is limited to few small labored steps with max assist at bedside to transfer to chair. She then wishes to return to bed. Patient demonstrates impaired activity tolerance throughout today's session and required frequent verbal cueing for sequencing. Patient and family member educated about scope of PT and further PT upon discharge.  Patient will benefit from continued physical therapy in hospital and recommended venue below to increase strength, balance, endurance for safe ADLs and gait.     Follow Up Recommendations SNF;Supervision/Assistance - 24 hour;Supervision for mobility/OOB    Equipment Recommendations  None recommended by PT    Recommendations for Other Services       Precautions / Restrictions Precautions Precautions: Fall Restrictions Weight Bearing Restrictions: No      Mobility  Bed Mobility Overal bed mobility: Needs Assistance Bed Mobility: Supine to Sit;Sit to Supine     Supine to sit: Mod assist Sit to supine: Mod assist   General bed mobility comments: to transition to seated EOB, slow, labored, frequent verbal cueing to scoot closer to EOB  Transfers Overall transfer  level: Needs assistance Equipment used: Rolling walker (2 wheeled) Transfers: Sit to/from [employer] Transfers Sit to Stand: Max assist Stand pivot transfers: Max assist       General transfer comment: Patient unable to transfer to standing with RW due to difficulty sequencing, requires max assist to stand and remain standing, transfers to chair; patient wishes to return to bed, max assist to transfer back to bed  Ambulation/Gait Ambulation/Gait assistance: Max assist Gait Distance (Feet): 4 Feet   Gait Pattern/deviations: Decreased step length - right;Decreased step length - left;Decreased stride length;Shuffle;Narrow base of support Gait velocity: very slowly   General Gait Details: patient requires max assist for few small, labored steps at bedside to transfer to chair  Stairs            Wheelchair Mobility    Modified Rankin (Stroke Patients Only)       Balance Overall balance assessment: Needs assistance Sitting-balance support: Feet supported;No upper extremity supported Sitting balance-Leahy Scale: Fair Sitting balance - Comments: seated EOB   Standing balance support: No upper extremity supported Standing balance-Leahy Scale: Zero                               Pertinent Vitals/Pain Pain Assessment: No/denies pain    Home Living Family/patient expects to be discharged to:: Private residence Living Arrangements: Children Available Help at Discharge: Family;Friend(s);Available 24 hours/day Type of Home: House Home Access: Stairs to enter Entrance Stairs-Rails: Can reach both;Right;Left Entrance Stairs-Number of Steps: 2 Home Layout: One level Home Equipment: Walker - 2 wheels;Cane - single point;Bedside commode;Shower seat      Prior Function  Level of Independence: Needs assistance   Gait / Transfers Assistance Needed: family assists with transfers and ambualtion, sometimes uses RW  ADL's / Homemaking Assistance Needed: assisted  by family  Comments: Household ambulator     Journalist, newspaper   Dominant Hand: Right    Extremity/Trunk Assessment   Upper Extremity Assessment Upper Extremity Assessment: Generalized weakness    Lower Extremity Assessment Lower Extremity Assessment: Generalized weakness       Communication   Communication: No difficulties  Cognition Arousal/Alertness: Awake/alert Behavior During Therapy: Flat affect Overall Cognitive Status: Within Functional Limits for tasks assessed                                        General Comments      Exercises     Assessment/Plan    PT Assessment Patient needs continued PT services  PT Problem List Decreased strength;Decreased activity tolerance;Decreased balance;Decreased mobility;Decreased knowledge of use of DME;Decreased safety awareness       PT Treatment Interventions DME instruction;Gait training;Functional mobility training;Therapeutic activities;Therapeutic exercise;Balance training;Patient/family education;Neuromuscular re-education    PT Goals (Current goals can be found in the Care Plan section)  Acute Rehab PT Goals Patient Stated Goal: To go to rehab and return home with family to assist PT Goal Formulation: With patient Time For Goal Achievement: 05/04/19 Potential to Achieve Goals: Fair    Frequency Min 3X/week   Barriers to discharge        Co-evaluation               AM-PAC PT "6 Clicks" Mobility  Outcome Measure Help needed turning from your back to your side while in a flat bed without using bedrails?: A Little Help needed moving from lying on your back to sitting on the side of a flat bed without using bedrails?: A Lot Help needed moving to and from a bed to a chair (including a wheelchair)?: A Lot Help needed standing up from a chair using your arms (e.g., wheelchair or bedside chair)?: A Lot Help needed to walk in hospital room?: A Lot Help needed climbing 3-5 steps with a railing?  : Total 6 Click Score: 12    End of Session Equipment Utilized During Treatment: Gait belt;Oxygen Activity Tolerance: Patient limited by fatigue Patient left: in bed;with family/visitor present;with call bell/phone within reach;with bed alarm set Nurse Communication: Mobility status PT Visit Diagnosis: Unsteadiness on feet (R26.81);Other abnormalities of gait and mobility (R26.89);Muscle weakness (generalized) (M62.81)    Time: [id number] PT Time Calculation (min) (ACUTE ONLY): 41 min   Charges:   PT Evaluation $PT Eval Moderate Complexity: 1 Mod PT Treatments $Therapeutic Activity: 38-52 mins        11:05 AM, 04/20/19 Mearl Latin PT, DPT Physical Therapist at [facility]

## 2019-04-20 NOTE — Plan of Care (Signed)
  Problem: Acute Rehab PT Goals(only PT should resolve) Goal: Pt Will Go Sit To Supine/Side Outcome: Progressing Flowsheets (Taken 04/20/2019 1108) Pt will go Sit to Supine/Side: with minimal assist Goal: Patient Will Transfer Sit To/From Stand Outcome: Progressing Flowsheets (Taken 04/20/2019 1108) Patient will transfer sit to/from stand: with moderate assist Goal: Pt Will Transfer Bed To Chair/Chair To Bed Outcome: Progressing Flowsheets (Taken 04/20/2019 1108) Pt will Transfer Bed to Chair/Chair to Bed: with mod assist Goal: Pt Will Ambulate Outcome: Progressing Flowsheets (Taken 04/20/2019 1108) Pt will Ambulate:  25 feet  with moderate assist  with least restrictive assistive device  11:08 AM, 04/20/19 Mearl Latin PT, DPT Physical Therapist at [facility]

## 2019-04-20 NOTE — TOC Initial Note (Signed)
Transition of Care [facility]) - Initial/Assessment Note    Patient Details  Name: Renee Blake MRN: [id number] Date of Birth: 02/13/1929  [facility]) CM/SW Contact:    Boneta Lucks, RN Phone Number: 04/20/2019, 1:13 PM  Clinical Narrative:      Patient admitted for hypotension. Patient also has a high readmission score. Patient lives at home with assistance from her daughters. They provided transportation, from previous admission she has used Home health and has DME's in the home.  PT is recommending SNF, patient and family agreeable.  [facility] made a bed offer. [name]- daughter is accepting.  [facility] is starting [facility] AUTH and requesting another COVID test.  They will not accept rapid test.   MD and RN updated.           Expected Discharge Plan: Skilled Nursing Facility Barriers to Discharge: Barriers Unresolved (comment), Insurance Authorization(will not accept rapid covid)   Patient Goals and CMS Choice Patient states their goals for this hospitalization and ongoing recovery are:: to go to rehab CMS Medicare.gov Compare Post Acute Care list provided to:: Patient Represenative (must comment) Choice offered to / list presented to : Adult Children  Expected Discharge Plan and Services Expected Discharge Plan: [place] arrangements for the past 2 months: Single Family Home Expected Discharge Date: 04/20/19                   Prior Living Arrangements/Services Living arrangements for the past 2 months: Single Family Home Lives with:: Adult Children Patient language and need for interpreter reviewed:: Yes        Need for Family Participation in Patient Care: Yes (Comment) Care giver support system in place?: Yes (comment)   Criminal Activity/Legal Involvement Pertinent to Current Situation/Hospitalization: No - Comment as needed  Activities of Daily Living Home Assistive Devices/Equipment: Walker (specify type) ADL Screening  (condition at time of admission) Patient's cognitive ability adequate to safely complete daily activities?: Yes Is the patient deaf or have difficulty hearing?: No Does the patient have difficulty seeing, even when wearing glasses/contacts?: No Does the patient have difficulty concentrating, remembering, or making decisions?: Yes Patient able to express need for assistance with ADLs?: Yes Does the patient have difficulty dressing or bathing?: Yes Independently performs ADLs?: No Communication: Independent Dressing (OT): Needs assistance Is this a change from baseline?: Pre-admission baseline Grooming: Needs assistance Is this a change from baseline?: Pre-admission baseline Feeding: Independent Bathing: Needs assistance Is this a change from baseline?: Pre-admission baseline Toileting: Needs assistance Is this a change from baseline?: Pre-admission baseline In/Out Bed: Needs assistance Is this a change from baseline?: Pre-admission baseline Walks in Home: Needs assistance Is this a change from baseline?: Pre-admission baseline Does the patient have difficulty walking or climbing stairs?: Yes Weakness of Legs: Both Weakness of Arms/Hands: None  Permission Sought/Granted      Share Information with NAME: [name]     Permission granted to share info w Relationship: Daughter     Emotional Assessment     Affect (typically observed): Accepting Orientation: : Oriented to Self, Oriented to Place, Oriented to Situation Alcohol / Substance Use: Not Applicable Psych Involvement: No (comment)  Admission diagnosis:  Hypotension [I95.9] Hypotension, unspecified hypotension type [I95.9] Diarrhea, unspecified type [R19.7] Patient Active Problem List   Diagnosis Date Noted  . Goals of care, counseling/discussion   . Palliative care encounter   . Hypotension 04/17/2019  . Debility 03/16/2019  . Shortness  of breath 03/16/2019  . Cachexia ([place]) 03/16/2019  . Permanent atrial fibrillation  ([place])   . Hypoxia 03/15/2019  . CHF exacerbation ([place]) 03/14/2019  . Bilateral pleural effusion 03/04/2019  . Chronic atrial fibrillation ([place]) 03/04/2019  . DNR (do not resuscitate) 03/04/2019  . Acute on chronic diastolic congestive heart failure ([place]) 03/02/2019  . Chronic diastolic heart failure ([place]) 03/02/2019  . Chronic renal failure, stage 3b 03/02/2019  . Acute respiratory failure with hypoxia ([place]) 03/02/2019  . Acute exacerbation of CHF (congestive heart failure) ([place]) 01/27/2019  . Atrial fibrillation with RVR ([place]) 01/27/2019  . Iron deficiency anemia due to chronic blood loss---H/o Gi Bleed 01/27/2019  . Vaginal mass 07/29/2017  . Heme positive stool 07/25/2017  . Cellulitis 12/10/2016  . Facial cellulitis   . Erroneous encounter - disregard 03/17/2015  . AKI (acute kidney injury) ([place])   . Metabolic acidosis 0000000  . Acute renal failure syndrome ([place])   . Renal failure 08/22/2014  . Hyperkalemia 08/22/2014  . UTI (urinary tract infection) 08/22/2014  . Acute renal failure ([place]) 08/22/2014  . Dyspnea 03/15/2014  . Diabetes mellitus ([place]) 06/08/2013  . Acute diastolic congestive heart failure ([place]) 06/07/2013  . Syncope 03/26/2013  . Rectal bleeding 03/26/2013  . Dehydration 03/26/2013  . Generalized weakness 03/26/2013  . Malnutrition of moderate degree ([place]) 03/01/2013  . Diarrhea 02/28/2013  . Loss of weight 02/28/2013  . H/o Prior duodenal ulcer with hemorrhage 02/27/2013  . H/o Prior Lower GI bleed (anorectal Ca) 02/26/2013  . Acute blood loss anemia 02/26/2013  . UTI (lower urinary tract infection) 02/26/2013  . Hypertension 02/26/2013   PCP:  [name], MD Pharmacy:   [facility], [address] [place] [address] Phone: [phone] Fax: [phone]  [facility] [phone] - [place], [place] - [address] [place] [address] Phone:  [phone] Fax: [phone]   Readmission Risk Interventions Readmission Risk Prevention Plan 04/20/2019 03/15/2019 03/02/2019  Transportation Screening Complete Complete Complete  PCP or Specialist Appt within 5-7 Days - - -  PCP or Specialist Appt within 3-5 Days - - Complete  Home Care Screening - - -  Medication Review (RN CM) - - -  [place] or Home Care Consult - Complete Complete  Social Work Consult for [place] Planning/Counseling - Complete Complete  Palliative Care Screening - Not Applicable Complete  Medication Review Press photographer) Complete Complete Complete  PCP or Specialist appointment within 3-5 days of discharge Not Complete - -  [place] or Home Care Consult Complete - -  SW Recovery Care/Counseling Consult Complete - -  Palliative Care Screening Not Complete - -  Skilled Nursing Facility Complete - -  Some recent data might be hidden

## 2019-04-21 DIAGNOSIS — I959 Hypotension, unspecified: Secondary | ICD-10-CM | POA: Diagnosis not present

## 2019-04-21 DIAGNOSIS — I4819 Other persistent atrial fibrillation: Secondary | ICD-10-CM | POA: Diagnosis not present

## 2019-04-21 DIAGNOSIS — D6489 Other specified anemias: Secondary | ICD-10-CM | POA: Diagnosis not present

## 2019-04-21 DIAGNOSIS — I5033 Acute on chronic diastolic (congestive) heart failure: Secondary | ICD-10-CM

## 2019-04-21 DIAGNOSIS — Z85048 Personal history of other malignant neoplasm of rectum, rectosigmoid junction, and anus: Secondary | ICD-10-CM | POA: Diagnosis not present

## 2019-04-21 DIAGNOSIS — I4821 Permanent atrial fibrillation: Secondary | ICD-10-CM

## 2019-04-21 DIAGNOSIS — I5032 Chronic diastolic (congestive) heart failure: Secondary | ICD-10-CM | POA: Diagnosis not present

## 2019-04-21 DIAGNOSIS — D649 Anemia, unspecified: Secondary | ICD-10-CM | POA: Diagnosis not present

## 2019-04-21 DIAGNOSIS — R0902 Hypoxemia: Secondary | ICD-10-CM | POA: Diagnosis not present

## 2019-04-21 DIAGNOSIS — J9601 Acute respiratory failure with hypoxia: Secondary | ICD-10-CM | POA: Diagnosis not present

## 2019-04-21 DIAGNOSIS — Z743 Need for continuous supervision: Secondary | ICD-10-CM | POA: Diagnosis not present

## 2019-04-21 DIAGNOSIS — Z7189 Other specified counseling: Secondary | ICD-10-CM | POA: Diagnosis not present

## 2019-04-21 DIAGNOSIS — Z20822 Contact with and (suspected) exposure to covid-19: Secondary | ICD-10-CM | POA: Diagnosis not present

## 2019-04-21 DIAGNOSIS — R5381 Other malaise: Secondary | ICD-10-CM | POA: Diagnosis not present

## 2019-04-21 DIAGNOSIS — Z7401 Bed confinement status: Secondary | ICD-10-CM | POA: Diagnosis not present

## 2019-04-21 DIAGNOSIS — I482 Chronic atrial fibrillation, unspecified: Secondary | ICD-10-CM | POA: Diagnosis not present

## 2019-04-21 DIAGNOSIS — I452 Bifascicular block: Secondary | ICD-10-CM

## 2019-04-21 DIAGNOSIS — I10 Essential (primary) hypertension: Secondary | ICD-10-CM | POA: Diagnosis not present

## 2019-04-21 DIAGNOSIS — N1832 Chronic kidney disease, stage 3b: Secondary | ICD-10-CM | POA: Diagnosis not present

## 2019-04-21 DIAGNOSIS — Z9981 Dependence on supplemental oxygen: Secondary | ICD-10-CM

## 2019-04-21 DIAGNOSIS — J90 Pleural effusion, not elsewhere classified: Secondary | ICD-10-CM

## 2019-04-21 DIAGNOSIS — I509 Heart failure, unspecified: Secondary | ICD-10-CM | POA: Diagnosis not present

## 2019-04-21 DIAGNOSIS — J810 Acute pulmonary edema: Secondary | ICD-10-CM | POA: Diagnosis not present

## 2019-04-21 DIAGNOSIS — J9621 Acute and chronic respiratory failure with hypoxia: Secondary | ICD-10-CM | POA: Diagnosis not present

## 2019-04-21 DIAGNOSIS — Z85828 Personal history of other malignant neoplasm of skin: Secondary | ICD-10-CM | POA: Diagnosis not present

## 2019-04-21 DIAGNOSIS — Z8719 Personal history of other diseases of the digestive system: Secondary | ICD-10-CM

## 2019-04-21 DIAGNOSIS — I4891 Unspecified atrial fibrillation: Secondary | ICD-10-CM | POA: Diagnosis not present

## 2019-04-21 DIAGNOSIS — E876 Hypokalemia: Secondary | ICD-10-CM | POA: Diagnosis not present

## 2019-04-21 DIAGNOSIS — E1143 Type 2 diabetes mellitus with diabetic autonomic (poly)neuropathy: Secondary | ICD-10-CM | POA: Diagnosis not present

## 2019-04-21 DIAGNOSIS — I130 Hypertensive heart and chronic kidney disease with heart failure and stage 1 through stage 4 chronic kidney disease, or unspecified chronic kidney disease: Secondary | ICD-10-CM | POA: Diagnosis not present

## 2019-04-21 DIAGNOSIS — I110 Hypertensive heart disease with heart failure: Secondary | ICD-10-CM

## 2019-04-21 DIAGNOSIS — R627 Adult failure to thrive: Secondary | ICD-10-CM | POA: Diagnosis not present

## 2019-04-21 DIAGNOSIS — K219 Gastro-esophageal reflux disease without esophagitis: Secondary | ICD-10-CM | POA: Diagnosis not present

## 2019-04-21 DIAGNOSIS — Z79899 Other long term (current) drug therapy: Secondary | ICD-10-CM | POA: Diagnosis not present

## 2019-04-21 DIAGNOSIS — E785 Hyperlipidemia, unspecified: Secondary | ICD-10-CM | POA: Diagnosis not present

## 2019-04-21 DIAGNOSIS — Z8542 Personal history of malignant neoplasm of other parts of uterus: Secondary | ICD-10-CM | POA: Diagnosis not present

## 2019-04-21 DIAGNOSIS — Z66 Do not resuscitate: Secondary | ICD-10-CM | POA: Diagnosis not present

## 2019-04-21 DIAGNOSIS — E43 Unspecified severe protein-calorie malnutrition: Secondary | ICD-10-CM | POA: Diagnosis not present

## 2019-04-21 DIAGNOSIS — M6281 Muscle weakness (generalized): Secondary | ICD-10-CM | POA: Diagnosis not present

## 2019-04-21 DIAGNOSIS — E1122 Type 2 diabetes mellitus with diabetic chronic kidney disease: Secondary | ICD-10-CM | POA: Diagnosis not present

## 2019-04-21 DIAGNOSIS — Z79890 Hormone replacement therapy (postmenopausal): Secondary | ICD-10-CM | POA: Diagnosis not present

## 2019-04-21 DIAGNOSIS — Z87442 Personal history of urinary calculi: Secondary | ICD-10-CM

## 2019-04-21 DIAGNOSIS — E119 Type 2 diabetes mellitus without complications: Secondary | ICD-10-CM

## 2019-04-21 DIAGNOSIS — R000 Tachycardia, unspecified: Secondary | ICD-10-CM | POA: Diagnosis not present

## 2019-04-21 DIAGNOSIS — Z515 Encounter for palliative care: Secondary | ICD-10-CM | POA: Diagnosis not present

## 2019-04-21 DIAGNOSIS — I4811 Longstanding persistent atrial fibrillation: Secondary | ICD-10-CM | POA: Diagnosis not present

## 2019-04-21 DIAGNOSIS — J189 Pneumonia, unspecified organism: Secondary | ICD-10-CM | POA: Diagnosis not present

## 2019-04-21 DIAGNOSIS — I340 Nonrheumatic mitral (valve) insufficiency: Secondary | ICD-10-CM | POA: Diagnosis not present

## 2019-04-21 NOTE — Progress Notes (Signed)
Pt left via stretcher with [facility] for transfer to [facility]. All pt belongings with pt's daugher and POA, Rita.

## 2019-04-21 NOTE — Progress Notes (Signed)
Chart reviewed and discharge summary read. Patient with stable VS and neg repeat COVID test. Safe and hemodynamically stable to discharge to SNF. palliative care service to follow her at SNF. Please refer to Discharge summary written by Dr. Manuella Ghazi on 04/20/19 for further info/details.    Barton Dubois MD [phone]

## 2019-04-21 NOTE — Progress Notes (Signed)
Patient report called to Claris Pong, Med Tech at [facility]. Per Education officer, museum, EMS has been notified of needed transport. Patient and daughter [name] [facility]) aware of pending transfer and agreeable.

## 2019-04-21 NOTE — TOC Transition Note (Signed)
Transition of Care [facility]) - CM/SW Discharge Note   Patient Details  Name: Renee Blake MRN: [id number] Date of Birth: Oct 31, 1928  Transition of [facility]) CM/SW Contact:  Shade Flood, LCSW Phone Number: 04/21/2019, 12:03 PM   Clinical Narrative:     Pt stable for dc today per MD. Tobie Poet at [facility] and they can accept pt today. Updated RN with phone number to call for report. Daughter in the room with pt and RN updated her. EMS arranged for 1:00.  DC clinical sent electronically. EMS form printed to the floor.  There are no other TOC needs for dc.  Final next level of care: Skilled Nursing Facility Barriers to Discharge: Barriers Resolved   Patient Goals and CMS Choice Patient states their goals for this hospitalization and ongoing recovery are:: to go to rehab CMS Medicare.gov Compare Post Acute Care list provided to:: Patient Represenative (must comment) Choice offered to / list presented to : Adult Children  Discharge Placement              Patient chooses bed at: [facility] Patient to be transferred to facility by: EMS Name of family member notified: [name] Patient and family notified of of transfer: 04/21/19  Discharge Plan and Services                                     Social Determinants of Health (SDOH) Interventions     Readmission Risk Interventions Readmission Risk Prevention Plan 04/20/2019 03/15/2019 03/02/2019  Transportation Screening Complete Complete Complete  PCP or Specialist Appt within 5-7 Days - - -  PCP or Specialist Appt within 3-5 Days - - Complete  Home Care Screening - - -  Medication Review (RN CM) - - -  [place] or [place] - Complete Complete  Social Work Consult for [place] Planning/Counseling - Complete Complete  Palliative Care Screening - Not Applicable Complete  Medication Review Press photographer) Complete Complete Complete  PCP or Specialist appointment within 3-5 days of  discharge Not Complete - -  [place] or Home Care Consult Complete - -  SW Recovery Care/Counseling Consult Complete - -  Palliative Care Screening Not Complete - -  Skilled Nursing Facility Complete - -  Some recent data might be hidden

## 2019-04-22 DIAGNOSIS — I482 Chronic atrial fibrillation, unspecified: Secondary | ICD-10-CM | POA: Diagnosis not present

## 2019-04-22 DIAGNOSIS — R5381 Other malaise: Secondary | ICD-10-CM | POA: Diagnosis not present

## 2019-04-22 DIAGNOSIS — I509 Heart failure, unspecified: Secondary | ICD-10-CM | POA: Diagnosis not present

## 2019-04-22 DIAGNOSIS — K219 Gastro-esophageal reflux disease without esophagitis: Secondary | ICD-10-CM | POA: Diagnosis not present

## 2019-04-26 ENCOUNTER — Emergency Department (HOSPITAL_COMMUNITY): Payer: Medicare Other

## 2019-04-26 ENCOUNTER — Inpatient Hospital Stay (HOSPITAL_COMMUNITY)
Admission: EM | Admit: 2019-04-26 | Discharge: 2019-05-04 | DRG: 291 | Disposition: A | Discharge status: Hospice | Payer: Medicare Other | Attending: Internal Medicine | Admitting: Internal Medicine

## 2019-04-26 ENCOUNTER — Encounter (HOSPITAL_COMMUNITY): Payer: Self-pay | Admitting: *Deleted

## 2019-04-26 ENCOUNTER — Other Ambulatory Visit: Payer: Self-pay

## 2019-04-26 DIAGNOSIS — R627 Adult failure to thrive: Secondary | ICD-10-CM | POA: Diagnosis present

## 2019-04-26 DIAGNOSIS — E43 Unspecified severe protein-calorie malnutrition: Secondary | ICD-10-CM | POA: Diagnosis not present

## 2019-04-26 DIAGNOSIS — I340 Nonrheumatic mitral (valve) insufficiency: Secondary | ICD-10-CM | POA: Diagnosis not present

## 2019-04-26 DIAGNOSIS — E876 Hypokalemia: Secondary | ICD-10-CM | POA: Diagnosis present

## 2019-04-26 DIAGNOSIS — Z79890 Hormone replacement therapy (postmenopausal): Secondary | ICD-10-CM | POA: Diagnosis not present

## 2019-04-26 DIAGNOSIS — N1832 Chronic kidney disease, stage 3b: Secondary | ICD-10-CM | POA: Diagnosis not present

## 2019-04-26 DIAGNOSIS — Z743 Need for continuous supervision: Secondary | ICD-10-CM | POA: Diagnosis not present

## 2019-04-26 DIAGNOSIS — Z85048 Personal history of other malignant neoplasm of rectum, rectosigmoid junction, and anus: Secondary | ICD-10-CM | POA: Diagnosis not present

## 2019-04-26 DIAGNOSIS — R0902 Hypoxemia: Secondary | ICD-10-CM

## 2019-04-26 DIAGNOSIS — Z7189 Other specified counseling: Secondary | ICD-10-CM | POA: Diagnosis not present

## 2019-04-26 DIAGNOSIS — Z66 Do not resuscitate: Secondary | ICD-10-CM | POA: Diagnosis not present

## 2019-04-26 DIAGNOSIS — Z8542 Personal history of malignant neoplasm of other parts of uterus: Secondary | ICD-10-CM | POA: Diagnosis not present

## 2019-04-26 DIAGNOSIS — I4821 Permanent atrial fibrillation: Secondary | ICD-10-CM | POA: Diagnosis present

## 2019-04-26 DIAGNOSIS — J9621 Acute and chronic respiratory failure with hypoxia: Secondary | ICD-10-CM | POA: Diagnosis present

## 2019-04-26 DIAGNOSIS — D6489 Other specified anemias: Secondary | ICD-10-CM | POA: Diagnosis not present

## 2019-04-26 DIAGNOSIS — I509 Heart failure, unspecified: Secondary | ICD-10-CM | POA: Diagnosis not present

## 2019-04-26 DIAGNOSIS — I5033 Acute on chronic diastolic (congestive) heart failure: Secondary | ICD-10-CM | POA: Diagnosis not present

## 2019-04-26 DIAGNOSIS — I5030 Unspecified diastolic (congestive) heart failure: Secondary | ICD-10-CM

## 2019-04-26 DIAGNOSIS — J810 Acute pulmonary edema: Secondary | ICD-10-CM

## 2019-04-26 DIAGNOSIS — E1143 Type 2 diabetes mellitus with diabetic autonomic (poly)neuropathy: Secondary | ICD-10-CM | POA: Diagnosis not present

## 2019-04-26 DIAGNOSIS — J90 Pleural effusion, not elsewhere classified: Secondary | ICD-10-CM

## 2019-04-26 DIAGNOSIS — J189 Pneumonia, unspecified organism: Secondary | ICD-10-CM | POA: Diagnosis not present

## 2019-04-26 DIAGNOSIS — I10 Essential (primary) hypertension: Secondary | ICD-10-CM | POA: Diagnosis present

## 2019-04-26 DIAGNOSIS — Z85828 Personal history of other malignant neoplasm of skin: Secondary | ICD-10-CM | POA: Diagnosis not present

## 2019-04-26 DIAGNOSIS — Z515 Encounter for palliative care: Secondary | ICD-10-CM | POA: Diagnosis not present

## 2019-04-26 DIAGNOSIS — J9601 Acute respiratory failure with hypoxia: Secondary | ICD-10-CM | POA: Diagnosis present

## 2019-04-26 DIAGNOSIS — Z20822 Contact with and (suspected) exposure to covid-19: Secondary | ICD-10-CM | POA: Diagnosis present

## 2019-04-26 DIAGNOSIS — I4811 Longstanding persistent atrial fibrillation: Secondary | ICD-10-CM | POA: Diagnosis not present

## 2019-04-26 DIAGNOSIS — E1122 Type 2 diabetes mellitus with diabetic chronic kidney disease: Secondary | ICD-10-CM | POA: Diagnosis not present

## 2019-04-26 DIAGNOSIS — Z79899 Other long term (current) drug therapy: Secondary | ICD-10-CM

## 2019-04-26 DIAGNOSIS — D649 Anemia, unspecified: Secondary | ICD-10-CM | POA: Diagnosis not present

## 2019-04-26 DIAGNOSIS — I130 Hypertensive heart and chronic kidney disease with heart failure and stage 1 through stage 4 chronic kidney disease, or unspecified chronic kidney disease: Principal | ICD-10-CM | POA: Diagnosis present

## 2019-04-26 DIAGNOSIS — I4891 Unspecified atrial fibrillation: Secondary | ICD-10-CM | POA: Diagnosis not present

## 2019-04-26 DIAGNOSIS — E785 Hyperlipidemia, unspecified: Secondary | ICD-10-CM | POA: Diagnosis present

## 2019-04-26 DIAGNOSIS — J811 Chronic pulmonary edema: Secondary | ICD-10-CM

## 2019-04-26 DIAGNOSIS — E119 Type 2 diabetes mellitus without complications: Secondary | ICD-10-CM

## 2019-04-26 DIAGNOSIS — I4819 Other persistent atrial fibrillation: Secondary | ICD-10-CM | POA: Diagnosis not present

## 2019-04-26 LAB — BRAIN NATRIURETIC PEPTIDE: B Natriuretic Peptide: 947 pg/mL — ABNORMAL HIGH (ref 0.0–100.0)

## 2019-04-26 LAB — COMPREHENSIVE METABOLIC PANEL
ALT: 82 U/L — ABNORMAL HIGH (ref 0–44)
AST: 48 U/L — ABNORMAL HIGH (ref 15–41)
Albumin: 3.4 g/dL — ABNORMAL LOW (ref 3.5–5.0)
Alkaline Phosphatase: 100 U/L (ref 38–126)
Anion gap: 9 (ref 5–15)
BUN: 26 mg/dL — ABNORMAL HIGH (ref 8–23)
CO2: 22 mmol/L (ref 22–32)
Calcium: 8.3 mg/dL — ABNORMAL LOW (ref 8.9–10.3)
Chloride: 103 mmol/L (ref 98–111)
Creatinine, Ser: 1.26 mg/dL — ABNORMAL HIGH (ref 0.44–1.00)
GFR calc Af Amer: 44 mL/min — ABNORMAL LOW (ref >60)
GFR calc non Af Amer: 38 mL/min — ABNORMAL LOW (ref >60)
Glucose, Bld: 112 mg/dL — ABNORMAL HIGH (ref 70–99)
Potassium: 4.7 mmol/L (ref 3.5–5.1)
Sodium: 134 mmol/L — ABNORMAL LOW (ref 135–145)
Total Bilirubin: 0.2 mg/dL — ABNORMAL LOW (ref 0.3–1.2)
Total Protein: 7 g/dL (ref 6.5–8.1)

## 2019-04-26 LAB — CBC WITH DIFFERENTIAL/PLATELET
Abs Immature Granulocytes: 0.02 10*3/uL (ref 0.00–0.07)
Basophils Absolute: 0 10*3/uL (ref 0.0–0.1)
Basophils Relative: 0 %
Eosinophils Absolute: 0.2 10*3/uL (ref 0.0–0.5)
Eosinophils Relative: 2 %
HCT: 35.8 % — ABNORMAL LOW (ref 36.0–46.0)
Hemoglobin: 10.3 g/dL — ABNORMAL LOW (ref 12.0–15.0)
Immature Granulocytes: 0 %
Lymphocytes Relative: 14 %
Lymphs Abs: 1.4 10*3/uL (ref 0.7–4.0)
MCH: 28 pg (ref 26.0–34.0)
MCHC: 28.8 g/dL — ABNORMAL LOW (ref 30.0–36.0)
MCV: 97.3 fL (ref 80.0–100.0)
Monocytes Absolute: 1 10*3/uL (ref 0.1–1.0)
Monocytes Relative: 10 %
Neutro Abs: 7.4 10*3/uL (ref 1.7–7.7)
Neutrophils Relative %: 74 %
Platelets: 292 10*3/uL (ref 150–400)
RBC: 3.68 MIL/uL — ABNORMAL LOW (ref 3.87–5.11)
RDW: 16.5 % — ABNORMAL HIGH (ref 11.5–15.5)
WBC: 10 10*3/uL (ref 4.0–10.5)
nRBC: 0.2 % (ref 0.0–0.2)

## 2019-04-26 IMAGING — CT CT CHEST W/ CM
2 of 3 series · 15 of 36 positions shown, 18 images · IV contrast (Omnipaque or Isovue)
Comparison: Chest x-ray today.  Chest CT [DATE]

CLINICAL DATA: Pneumonia, effusions suspected.

EXAM:
CT CHEST WITH CONTRAST
TECHNIQUE: Multidetector CT imaging of the chest was performed during
intravenous contrast administration.
CONTRAST:  75mL OMNIPAQUE IOHEXOL 300 MG/ML  SOLN

[Series 2: routine chest with · axial · 0.63mm/px · z∈[+1146,+1388]mm · 12 of 143 slices shown, 15 images]
[im 11/143  mediastinal]
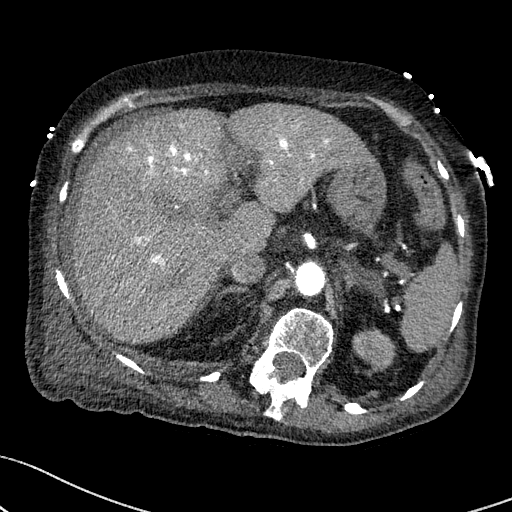
[im 11/143  lung]
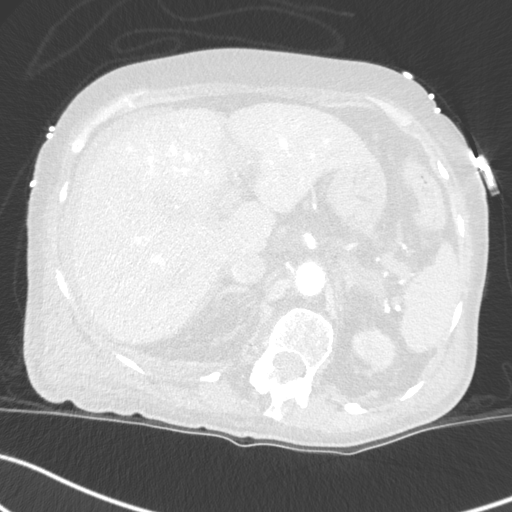
[im 22/143  lung]
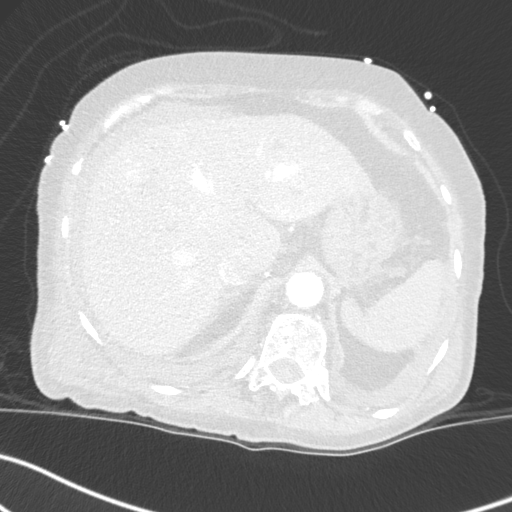
[im 32/143  lung]
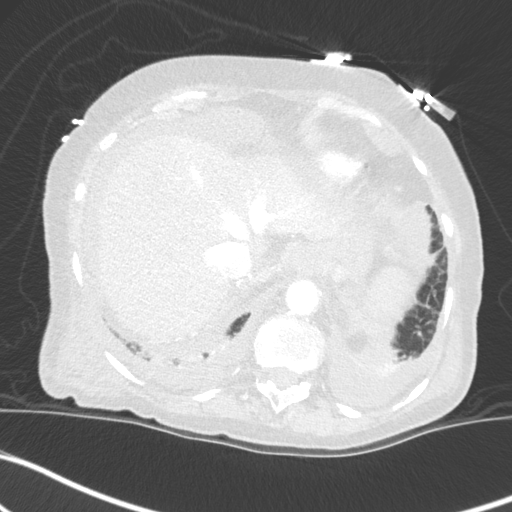
[im 43/143  lung]
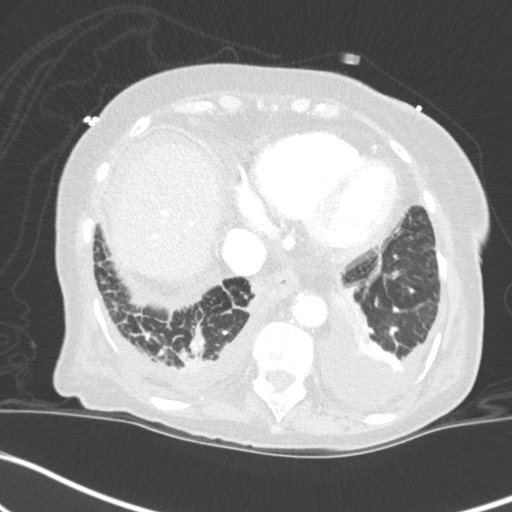
[im 53/143  mediastinal]
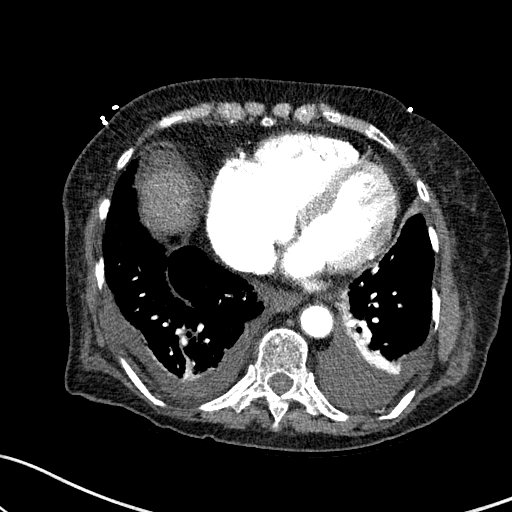
[im 53/143  lung]
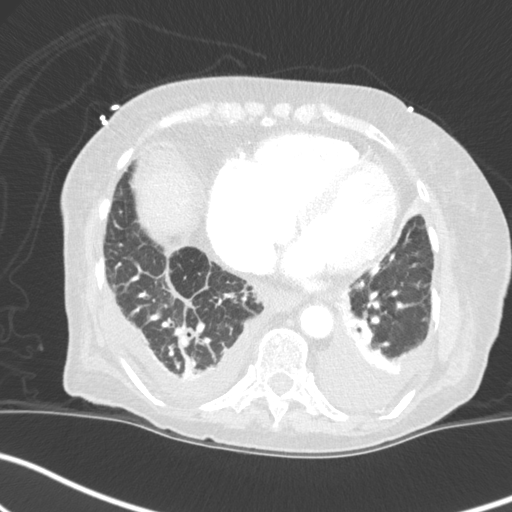
[im 64/143  lung]
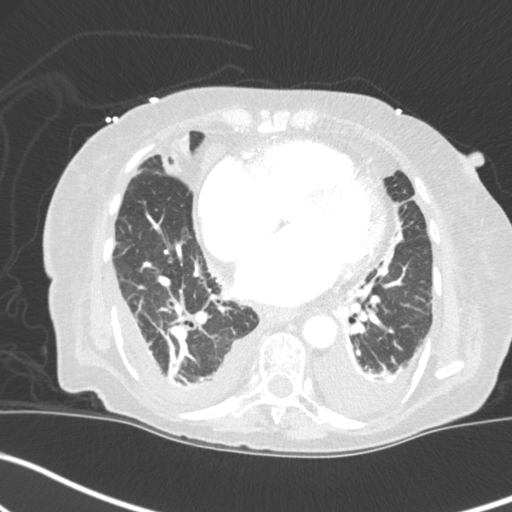
[im 79/143  lung]
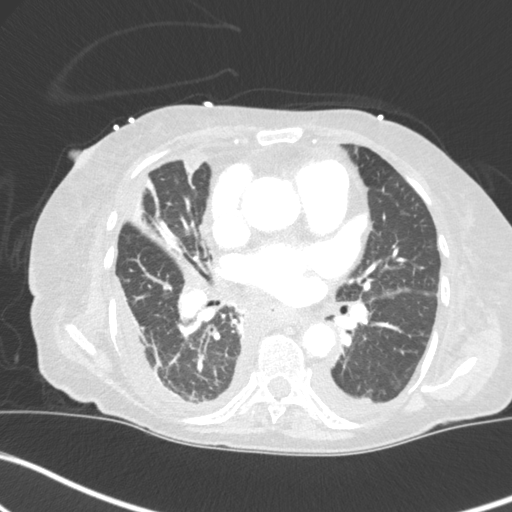
[im 90/143  lung]
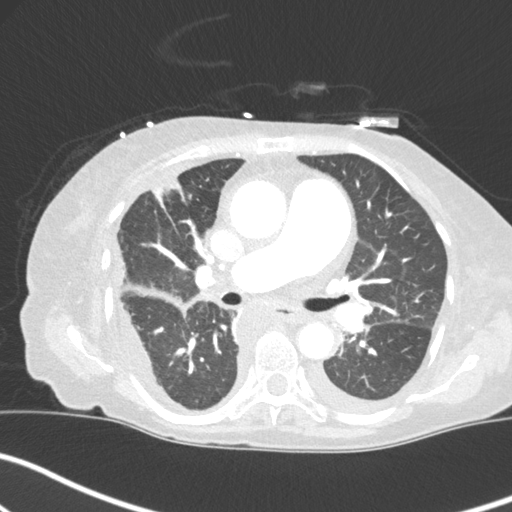
[im 100/143  mediastinal]
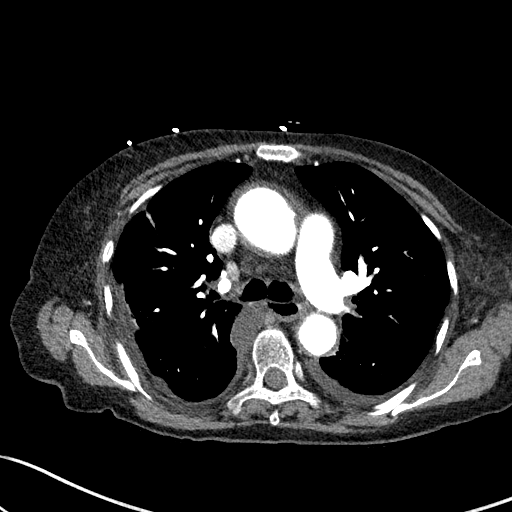
[im 100/143  lung]
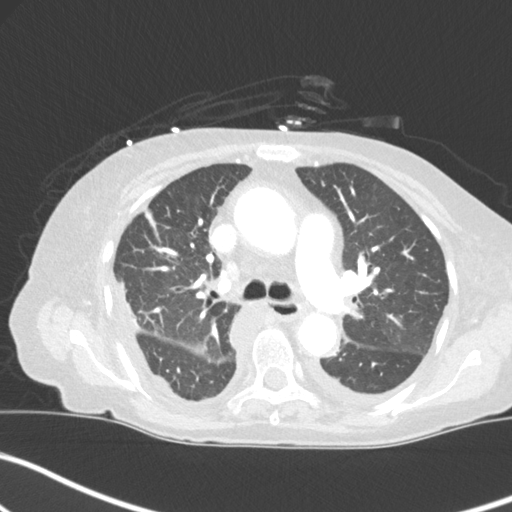
[im 111/143  lung]
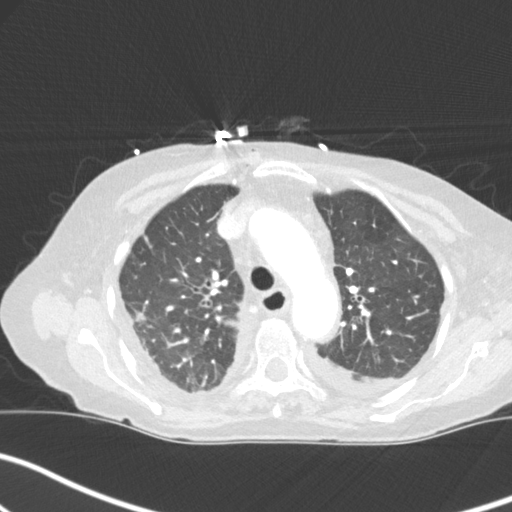
[im 121/143  lung]
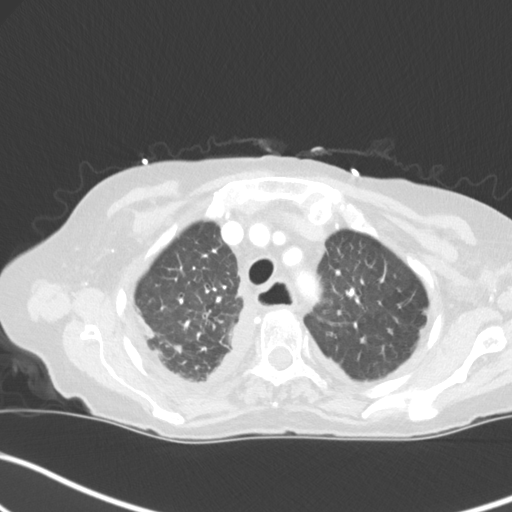
[im 132/143  lung]
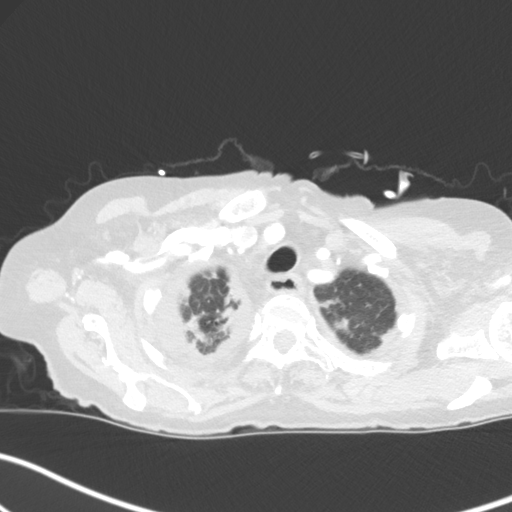

[Series 5: coronal · coronal · 0.57mm/px · 3 of 110 slices shown]
[im 22/110  lung]
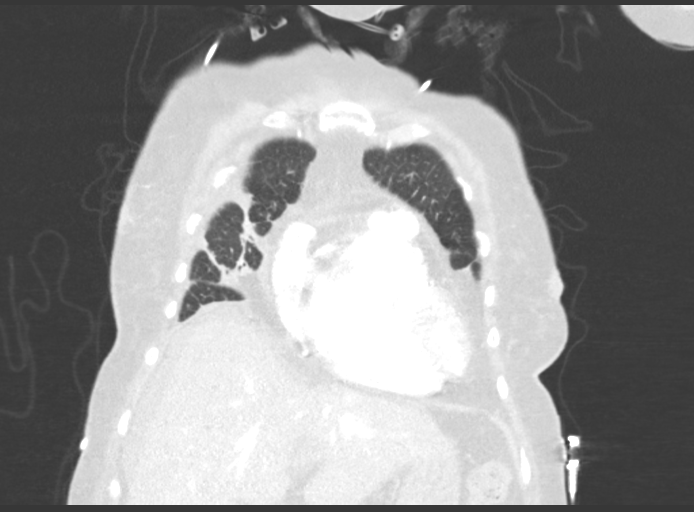
[im 44/110  lung]
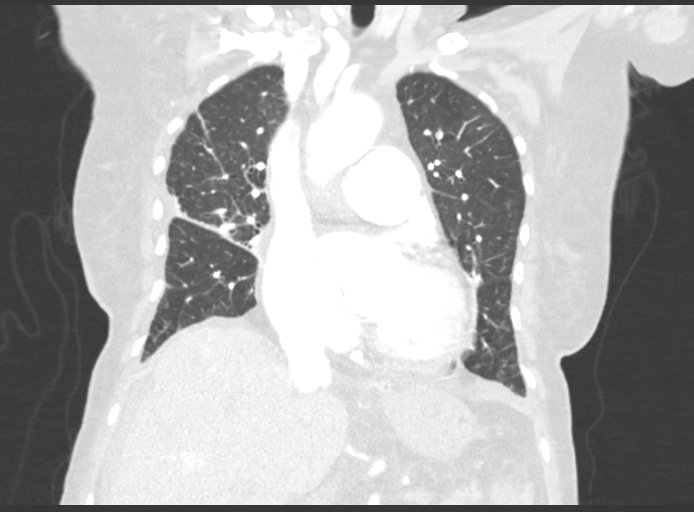
[im 66/110  lung]
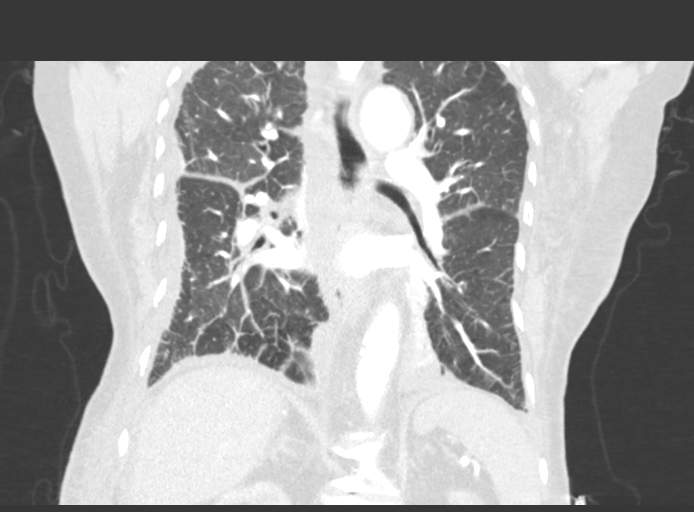

[15 of 36 positions shown; findings below may reference images not displayed]

FINDINGS: Cardiovascular: Heart is enlarged. Aorta normal caliber with
scattered calcifications. No filling defects in the pulmonary
arteries to suggest pulmonary emboli.

Mediastinum/Nodes: No mediastinal, hilar, or axillary adenopathy.
Scattered small mediastinal lymph nodes.

Lungs/Pleura: Enlarging bilateral pleural effusions since prior
chest CT. Right pleural effusion is loculated. Compressive
atelectasis in the left lower lobe. Bandlike opacity in the right
lower lobe, lingula and right middle lobe, likely areas of
atelectasis. There is peribronchial thickening diffusely. Biapical
scarring.

Upper Abdomen: Imaging into the upper abdomen shows no acute
findings.

Musculoskeletal: Chest wall soft tissues are unremarkable. No acute
bony abnormality.
IMPRESSION: Small to moderate bilateral pleural effusions which have increased
in size since prior chest CT. Right pleural effusion is loculated.

Areas of atelectasis in the lower lobes bilaterally as well as right
middle lobe and lingula.

Peribronchial thickening compatible with bronchitis.

Cardiomegaly.

## 2019-04-26 IMAGING — DX DG CHEST 1V PORT
1 series · 1 of 1 positions shown · non-contrast
Comparison: Portable chest [DATE] and earlier.

CLINICAL DATA: 89-year-old female with confusion, "Hot flashes"
which the patient has complained of when in atrial fibrillation
previously.

EXAM:
PORTABLE CHEST 1 VIEW

[chest ap]
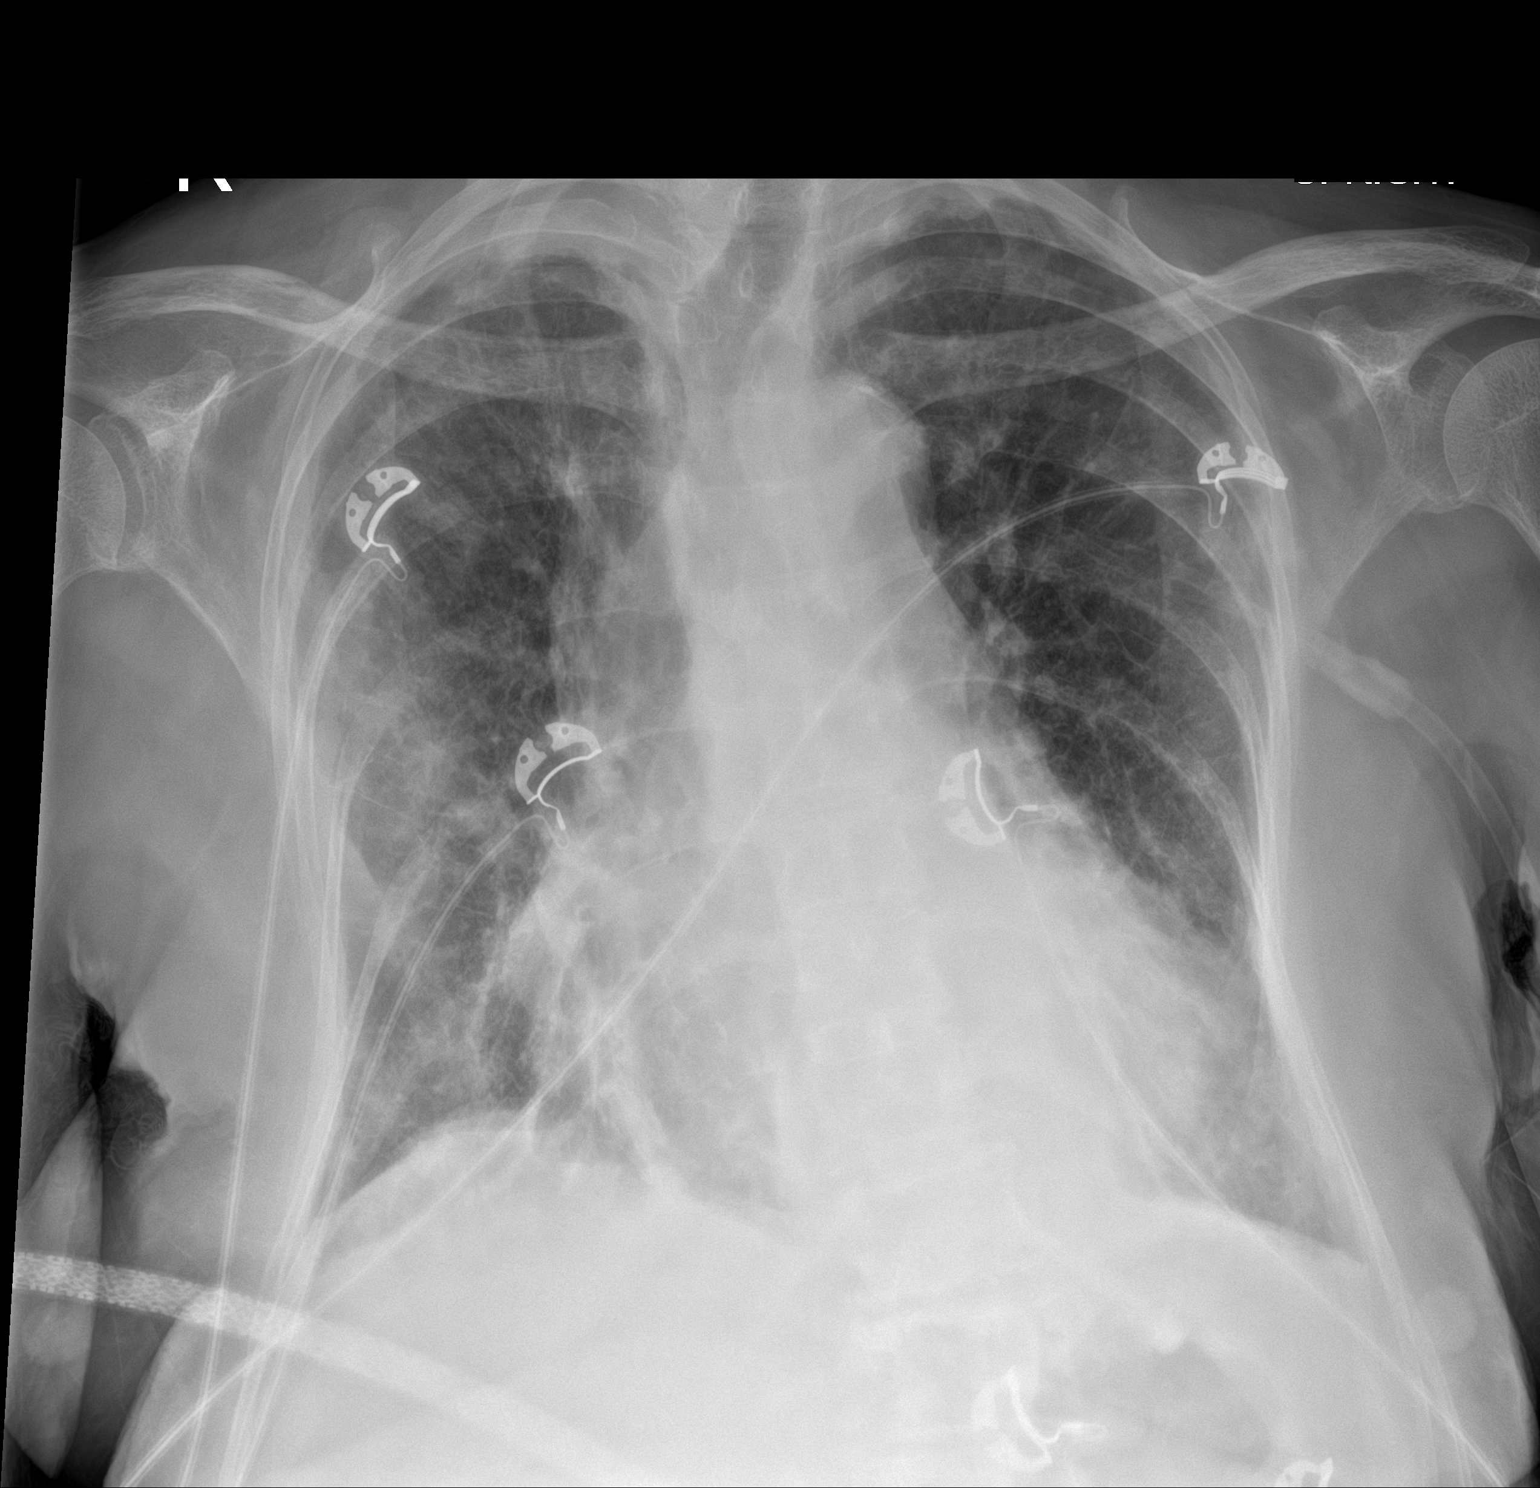

[1 of 1 positions shown; findings below may reference images not displayed]

FINDINGS: Portable AP upright view at [UP] hours. Stable cardiomegaly and
mediastinal contours. Hazy and patchy opacity in the right lung
which might appear similar to that on [DATE] portable chest. But
increased streaky right infrahilar opacity. Pulmonary vascularity
appears decreased from [DATE]. No pneumothorax. Small bilateral
pleural effusions seen by CTA on [DATE] have been poorly visible
radiographically. No confluent left lung opacity.

Stable visualized osseous structures. Negative visible bowel gas
pattern.
IMPRESSION: 1. Nonspecific increased streaky right lung opacity since
[DATE]. Favor increased atelectasis but acute infection is
difficult to exclude.
2. Otherwise stable including cardiomegaly, small pleural effusions
which were better demonstrated by CTA earlier this month.

## 2019-04-26 MED ORDER — IOHEXOL 300 MG/ML  SOLN
75.0000 mL | Freq: Once | INTRAMUSCULAR | Status: AC | PRN
  Administered 2019-04-26: 75 mL via INTRAVENOUS

## 2019-04-26 MED ORDER — FUROSEMIDE 10 MG/ML IJ SOLN
20.0000 mg | Freq: Once | INTRAMUSCULAR | Status: AC
  Administered 2019-04-27: 20 mg via INTRAVENOUS
  Filled 2019-04-26: qty 2

## 2019-04-26 NOTE — ED Triage Notes (Signed)
EMS called to facility because pt was having mild confusion and "hot flashes."  Per family, when pt has "hot flashes" she is in afib and asked facility to call EMS.

## 2019-04-26 NOTE — ED Provider Notes (Signed)
[facility] EMERGENCY DEPARTMENT Provider Note   CSN: [id number] Arrival date & time: 04/26/19  1836     History Chief Complaint  Patient presents with  . Altered Mental Status    Renee Blake is a 86 y.o. female.  Patient from [facility].  Patient brought in for mild confusion hot flashes some leg discomfort and shortness of breath.  Patient's family states that this occurs when she is in atrial fib.  Patient with recent admission to the hospital here January 16 through January 19.  Patient has a known history of atrial fib not a candidate to be on anticoagulation due to GI bleed problems and anemia problems.  With her most recent admission she was admitted for diarrhea mild nutrition to moderate degree generalized weakness acute exacerbation of her congestive heart failure.  She is known to have diastolic heart failure.  Patient was a DNR and does have DNR paperwork from the nursing facility patient according to daughter is on 2 L of oxygen at all times.  She is on diltiazem and she is on Pacerone.  There was some discussion about palliative care during the last admission.  Patient states that she feels she is got leg swelling she got leg discomfort she is having some on and off hot flashes.  And she is feeling short of breath even on her 2 L of oxygen.        Past Medical History:  Diagnosis Date  . Acute blood loss anemia   . Atrial fibrillation ([place])    a. diagnosed in 12/2018. Rate-control pursued given not a candidate for anticoagulation  . Chronic diarrhea   . Dilation of biliary tract   . Duodenal stricture   . Esophageal stricture   . Gastric ulcer   . GI bleed   . Hyperlipemia   . Hypertension   . Microscopic colitis   . Nephrolithiasis   . Pancreatic duct dilated   . Schatzki's ring   . Skin cancer   . Sliding hiatal hernia   . Vertigo     Patient Active Problem List   Diagnosis Date Noted  . Goals of care, counseling/discussion   . Palliative  care encounter   . Hypotension 04/17/2019  . Debility 03/16/2019  . Shortness of breath 03/16/2019  . Cachexia ([place]) 03/16/2019  . Permanent atrial fibrillation ([place])   . Hypoxia 03/15/2019  . CHF exacerbation ([place]) 03/14/2019  . Bilateral pleural effusion 03/04/2019  . Chronic atrial fibrillation ([place]) 03/04/2019  . DNR (do not resuscitate) 03/04/2019  . Acute on chronic diastolic congestive heart failure ([place]) 03/02/2019  . Chronic diastolic heart failure ([place]) 03/02/2019  . Chronic renal failure, stage 3b 03/02/2019  . Acute respiratory failure with hypoxia ([place]) 03/02/2019  . Acute exacerbation of CHF (congestive heart failure) ([place]) 01/27/2019  . Atrial fibrillation with RVR ([place]) 01/27/2019  . Iron deficiency anemia due to chronic blood loss---H/o Gi Bleed 01/27/2019  . Vaginal mass 07/29/2017  . Heme positive stool 07/25/2017  . Cellulitis 12/10/2016  . Facial cellulitis   . Erroneous encounter - disregard 03/17/2015  . AKI (acute kidney injury) ([place])   . Metabolic acidosis 0000000  . Acute renal failure syndrome ([place])   . Renal failure 08/22/2014  . Hyperkalemia 08/22/2014  . UTI (urinary tract infection) 08/22/2014  . Acute renal failure ([place]) 08/22/2014  . Dyspnea 03/15/2014  . Diabetes mellitus ([place]) 06/08/2013  . Acute diastolic congestive heart failure ([place]) 06/07/2013  . Syncope 03/26/2013  . Rectal  bleeding 03/26/2013  . Dehydration 03/26/2013  . Generalized weakness 03/26/2013  . Malnutrition of moderate degree ([place]) 03/01/2013  . Diarrhea 02/28/2013  . Loss of weight 02/28/2013  . H/o Prior duodenal ulcer with hemorrhage 02/27/2013  . H/o Prior Lower GI bleed (anorectal Ca) 02/26/2013  . Acute blood loss anemia 02/26/2013  . UTI (lower urinary tract infection) 02/26/2013  . Hypertension 02/26/2013    Past Surgical History:  Procedure Laterality Date  . BIOPSY  07/28/2017   Procedure: BIOPSY;  Surgeon: Rogene Houston, MD;  [facility];   Service: Endoscopy;;  rectum  . COLONOSCOPY N/A 07/28/2017   Procedure: COLONOSCOPY;  Surgeon: Rogene Houston, MD;  Location[facility];  Service: Endoscopy;  Laterality: N/A;  . ESOPHAGOGASTRODUODENOSCOPY N/A 02/27/2013   Procedure: ESOPHAGOGASTRODUODENOSCOPY (EGD);  Surgeon: Ladene Artist, MD;  Location: [facility] ENDOSCOPY;  Service: Endoscopy;  Laterality: N/A;  . ESOPHAGOGASTRODUODENOSCOPY N/A 03/28/2013   Procedure: ESOPHAGOGASTRODUODENOSCOPY (EGD);  Surgeon: Rogene Houston, MD;  Location: [facility];  Service: Endoscopy;  Laterality: N/A;  . HOT HEMOSTASIS N/A 02/27/2013   Procedure: HOT HEMOSTASIS (ARGON PLASMA COAGULATION/BICAP);  Surgeon: Ladene Artist, MD;  Location: [facility] ENDOSCOPY;  Service: Endoscopy;  Laterality: N/A;  . NOSE SURGERY     for skin cancer  . POLYPECTOMY  07/28/2017   Procedure: POLYPECTOMY;  Surgeon: Rogene Houston, MD;  Location: [facility];  Service: Endoscopy;;  colon      OB History    Gravida  6   Para  6   Term  6   Preterm      AB      Living  6     SAB      TAB      Ectopic      Multiple      Live Births              Family History  Problem Relation Age of Onset  . Heart attack Father     Social History   Tobacco Use  . Smoking status: Never Smoker  . Smokeless tobacco: Never Used  Substance Use Topics  . Alcohol use: No    Alcohol/week: 0.0 standard drinks  . Drug use: No    Home Medications Prior to Admission medications   Medication Sig Start Date End Date Taking? Authorizing Provider  acetaminophen (TYLENOL) 325 MG tablet Take 650 mg by mouth every 6 (six) hours as needed for mild pain or moderate pain.   Yes [provider]  ALPRAZolam (XANAX) 0.25 MG tablet Take 1 tablet (0.25 mg total) by mouth 2 (two) times daily as needed. for anxiety 04/20/19  Yes Manuella Ghazi, Pratik D, DO  amiodarone (PACERONE) 200 MG tablet Take 200 mg by mouth 2 (two) times daily.   Yes [provider]  dicyclomine  (BENTYL) 10 MG capsule Take 10 mg by mouth every 4 (four) hours as needed for spasms.   Yes [provider]  diltiazem (CARDIZEM SR) 90 MG 12 hr capsule Take 1 capsule (90 mg total) by mouth 2 (two) times daily. 04/03/19  Yes Kroeger, Daleen Snook M., PA-C  furosemide (LASIX) 40 MG tablet Take 20-40 mg by mouth See admin instructions. Taking 40 mg in am, and 20 mg every evening   Yes [provider]  levocetirizine (XYZAL) 5 MG tablet Take 5 mg by mouth every evening.   Yes [provider]  levothyroxine (SYNTHROID) 25 MCG tablet Take 25 mcg by mouth  daily before breakfast.   Yes [provider]  meclizine (ANTIVERT) 25 MG tablet Take 25 mg by mouth daily as needed for dizziness.  07/11/17  Yes [provider]  metoprolol succinate (TOPROL-XL) 50 MG 24 hr tablet Take 1 tablet (50 mg total) by mouth every morning. Take with or immediately following a meal. 04/16/19 07/15/19 Yes Herminio Commons, MD  Multiple Vitamin (MULTIVITAMIN WITH MINERALS) TABS tablet Take 1 tablet by mouth daily. 03/19/19  Yes Tat, Shanon Brow, MD  ondansetron (ZOFRAN) 4 MG tablet Take 4 mg by mouth every 8 (eight) hours as needed for nausea or vomiting.   Yes [provider]  potassium chloride SA (KLOR-CON) 20 MEQ tablet Take 20 mEq by mouth daily.   Yes [provider]  vitamin B-12 (CYANOCOBALAMIN) 500 MCG tablet Take 1 tablet (500 mcg total) by mouth daily. 03/19/19  Yes Tat, Shanon Brow, MD    Allergies    Clonazepam, Codeine, and Sulfa antibiotics  Review of Systems   Review of Systems  Constitutional: Negative for chills and fever.  HENT: Negative for congestion, rhinorrhea and sore throat.   Eyes: Negative for visual disturbance.  Respiratory: Positive for shortness of breath. Negative for cough.   Cardiovascular: Positive for leg swelling. Negative for chest pain.  Gastrointestinal: Negative for abdominal pain, diarrhea, nausea and vomiting.  Genitourinary: Negative  for dysuria.  Musculoskeletal: Negative for back pain and neck pain.  Skin: Negative for rash.  Neurological: Negative for dizziness, light-headedness and headaches.  Hematological: Does not bruise/bleed easily.  Psychiatric/Behavioral: Negative for confusion.    Physical Exam Updated Vital Signs BP 139/87   Pulse (!) 103   Temp 98.1 F (36.7 C) (Oral)   Resp (!) 25   Ht 1.626 m (5\' 4" )   Wt 51.7 kg   SpO2 100%   BMI 19.57 kg/m   Physical Exam Vitals and nursing note reviewed.  Constitutional:      General: She is not in acute distress.    Appearance: Normal appearance. She is well-developed.  HENT:     Head: Normocephalic and atraumatic.  Eyes:     Extraocular Movements: Extraocular movements intact.     Conjunctiva/sclera: Conjunctivae normal.     Pupils: Pupils are equal, round, and reactive to light.  Cardiovascular:     Rate and Rhythm: Normal rate. Rhythm irregular.     Heart sounds: No murmur.  Pulmonary:     Effort: Pulmonary effort is normal. No respiratory distress.     Breath sounds: Normal breath sounds.  Abdominal:     Palpations: Abdomen is soft.     Tenderness: There is no abdominal tenderness.  Musculoskeletal:        General: No swelling.     Cervical back: Neck supple.     Right lower leg: No edema.     Left lower leg: No edema.  Skin:    General: Skin is warm and dry.     Capillary Refill: Capillary refill takes less than 2 seconds.  Neurological:     General: No focal deficit present.     Mental Status: She is alert and oriented to person, place, and time.     Cranial Nerves: No cranial nerve deficit.     Sensory: No sensory deficit.     Motor: No weakness.     ED Results / Procedures / Treatments   Labs (all labs ordered are listed, but only abnormal results are displayed) Labs Reviewed  CBC WITH DIFFERENTIAL/PLATELET - Abnormal;  Notable for the following components:      Result Value   RBC 3.68 (*)    Hemoglobin 10.3 (*)    HCT  35.8 (*)    MCHC 28.8 (*)    RDW 16.5 (*)    All other components within normal limits  COMPREHENSIVE METABOLIC PANEL - Abnormal; Notable for the following components:   Sodium 134 (*)    Glucose, Bld 112 (*)    BUN 26 (*)    Creatinine, Ser 1.26 (*)    Calcium 8.3 (*)    Albumin 3.4 (*)    AST 48 (*)    ALT 82 (*)    Total Bilirubin 0.2 (*)    GFR calc non Af Amer 38 (*)    GFR calc Af Amer 44 (*)    All other components within normal limits  SARS CORONAVIRUS 2 (TAT 6-24 HRS)  BRAIN NATRIURETIC PEPTIDE    EKG EKG Interpretation  Date/Time:  Monday April 26 2019 23:14:21 EST Ventricular Rate:  95 PR Interval:    QRS Duration: 142 QT Interval:  398 QTC Calculation: 501 R Axis:   -65 Text Interpretation: Atrial fibrillation RBBB and LAFB No significant change since last tracing Confirmed by Fredia Sorrow [id number]) on 04/26/2019 11:25:29 PM   Radiology CT Chest W Contrast  Result Date: 04/26/2019 CLINICAL DATA:  Pneumonia, effusions suspected. EXAM: CT CHEST WITH CONTRAST TECHNIQUE: Multidetector CT imaging of the chest was performed during intravenous contrast administration. CONTRAST:  45mL OMNIPAQUE IOHEXOL 300 MG/ML  SOLN COMPARISON:  Chest x-ray today.  Chest CT 04/06/2019 FINDINGS: Cardiovascular: Heart is enlarged. Aorta normal caliber with scattered calcifications. No filling defects in the pulmonary arteries to suggest pulmonary emboli. Mediastinum/Nodes: No mediastinal, hilar, or axillary adenopathy. Scattered small mediastinal lymph nodes. Lungs/Pleura: Enlarging bilateral pleural effusions since prior chest CT. Right pleural effusion is loculated. Compressive atelectasis in the left lower lobe. Bandlike opacity in the right lower lobe, lingula and right middle lobe, likely areas of atelectasis. There is peribronchial thickening diffusely. Biapical scarring. Upper Abdomen: Imaging into the upper abdomen shows no acute findings. Musculoskeletal: Chest wall soft tissues  are unremarkable. No acute bony abnormality. IMPRESSION: Small to moderate bilateral pleural effusions which have increased in size since prior chest CT. Right pleural effusion is loculated. Areas of atelectasis in the lower lobes bilaterally as well as right middle lobe and lingula. Peribronchial thickening compatible with bronchitis. Cardiomegaly. Electronically Signed   By: Rolm Baptise M.D.   On: 04/26/2019 21:54   DG Chest Port 1 View  Result Date: 04/26/2019 CLINICAL DATA:  86 year old female with confusion, "Hot flashes" which the patient has complained of when in atrial fibrillation previously. EXAM: PORTABLE CHEST 1 VIEW COMPARISON:  Portable chest 04/17/2019 and earlier. FINDINGS: Portable AP upright view at 1859 hours. Stable cardiomegaly and mediastinal contours. Hazy and patchy opacity in the right lung which might appear similar to that on 04/05/2019 portable chest. But increased streaky right infrahilar opacity. Pulmonary vascularity appears decreased from 04/17/2019. No pneumothorax. Small bilateral pleural effusions seen by CTA on 04/06/2019 have been poorly visible radiographically. No confluent left lung opacity. Stable visualized osseous structures. Negative visible bowel gas pattern. IMPRESSION: 1. Nonspecific increased streaky right lung opacity since 04/17/2019. Favor increased atelectasis but acute infection is difficult to exclude. 2. Otherwise stable including cardiomegaly, small pleural effusions which were better demonstrated by CTA earlier this month. Electronically Signed   By: Genevie Ann M.D.   On: 04/26/2019 19:08    Procedures  Procedures (including critical care time)  Medications Ordered in ED Medications  iohexol (OMNIPAQUE) 300 MG/ML solution 75 mL (75 mLs Intravenous Contrast Given 04/26/19 2138)    ED Course  I have reviewed the triage vital signs and the nursing notes.  Pertinent labs & imaging results that were available during my care of the patient were  reviewed by me and considered in my medical decision making (see chart for details).    MDM Rules/Calculators/A&P                      Patient main complaint is discomfort in legs she feels that she has swelling but no obvious edema.  Patient is in atrial for but it is rate controlled.  Maximum rate was in the 90s.  Initial chest x-ray raise concerns for possible pneumonia or pleural effusions the patient had CT done with contrast.  Which showed moderate bilateral pleural effusions which may be responsible for some of the hypoxia.  In retrospect had wished that I done CT angio to rule out PE however patient is not a candidate for anticoagulation but the PEs were present maybe perhaps they would reconsider.  No significant anemia here hemoglobin is improved from discharge at 10 range.  Labs without significant abnormalities.  Patient on her 2 L will desat down into around 88%.  So oxygen level increased to 3 L.  They mentioned altered mental status but patient is quite alert follows commands quite well and seems to be quite oriented.  Patient is a DNR.  And that is at per the patient's wishes and the family's wishes discussed with her daughter twice.  Her daughter feels that this is all due to her atrial fib.  No evidence of any significant congestive heart failure here today based on the CT scan no significant pulmonary edema.  Patient normally followed by cardiology here Dr. Raliegh Ip.  Daughter requested cardiology likes to consult on her when she is admitted.  This was passed on to the admitting doctor.  Plan is to monitor her for her hypoxia concerned and may be reconsider CT angio of the chest to rule out PE in the morning.  Exact cause of the pleural effusions is not clear but they are getting worse and this may explain her shortness of breath and some of her worsening hypoxia.     Final Clinical Impression(s) / ED Diagnoses Final diagnoses:  Hypoxia  Longstanding persistent atrial fibrillation  (HCC)  Pleural effusion, bilateral    Rx / DC Orders ED Discharge Orders    None       Fredia Sorrow, MD 04/26/19 2332

## 2019-04-26 NOTE — H&P (Signed)
History and Physical    Renee Blake [id number] DOB: 06-10-1928 DOA: 04/26/2019  PCP: [name], MD  Patient coming from: NH.  I have personally briefly reviewed patient's old medical records in [place]  Chief Complaint: Confusion and hot flashes.  HPI: Renee Blake is a 86 y.o. female with medical history significant of history of anemia, chronic atrial fibrillation, chronic diarrhea, dilation of biliary tract, esophageal stricture, duodenal stricture, sliding hiatal hernia, gastric ulcer, history of GI bleed, hyperlipidemia, hypertension, microscopic colitis, nephrolithiasis, pancreatic duct dilation, Schatzki ring, skin cancer, vertigo who was recently admitted from January 16 to January 16 due to diarrhea, malnutrition, weakness and diastolic CHF exacerbation.  She is sent [facility] [place] due to confusion and hot flashes.  Her daughter stated to Dr. Rogene Houston that she usually has these hot flashes episodes when [name] starts acting up.  The patient is unable to elaborate much but has been complaining of lower extremity discomfort.  She has been having increased oxygen requirement from her baseline 2 LPM via [place].  ED Course: Her initial vital signs were temperature 98.1 F, pulse 78, respirations 28, blood pressure 124/82 mmHg and O2 sat 95% on room air.  Hemoglobin 10.3 g/dL and platelets 290.Sodium was 134 mmol/L, all other electrolytes are normal when calcium is corrected to an albumin of 3.4 g/dL.  Glucose 112, BUN 26 and creatinine 1.26 mg/dL.  AST was 48 and ALT was 82 units/L.  The rest of the CMP values are within normal range.  BNP was 947.0 pg/mL.  Her EKG show atrial fibrillation 95 bpm with previous RBBB and LAFB.  Her chest radiograph shows possible atelectasis with small pleural effusions and stable cardiomegaly.  Review of Systems: As per HPI otherwise 10 point review of systems negative.   Past Medical History:    Diagnosis Date  . Acute blood loss anemia   . Atrial fibrillation ([place])    a. diagnosed in 12/2018. Rate-control pursued given not a candidate for anticoagulation  . Chronic diarrhea   . Dilation of biliary tract   . Duodenal stricture   . Esophageal stricture   . Gastric ulcer   . GI bleed   . Hyperlipemia   . Hypertension   . Microscopic colitis   . Nephrolithiasis   . Pancreatic duct dilated   . Schatzki's ring   . Skin cancer   . Sliding hiatal hernia   . Vertigo     Past Surgical History:  Procedure Laterality Date  . BIOPSY  07/28/2017   Procedure: BIOPSY;  Surgeon: Rogene Houston, MD;  [facility];  Service: Endoscopy;;  rectum  . COLONOSCOPY N/A 07/28/2017   Procedure: COLONOSCOPY;  Surgeon: Rogene Houston, MD;  [facility];  Service: Endoscopy;  Laterality: N/A;  . ESOPHAGOGASTRODUODENOSCOPY N/A 02/27/2013   Procedure: ESOPHAGOGASTRODUODENOSCOPY (EGD);  Surgeon: Ladene Artist, MD;  Location: [facility] ENDOSCOPY;  Service: Endoscopy;  Laterality: N/A;  . ESOPHAGOGASTRODUODENOSCOPY N/A 03/28/2013   Procedure: ESOPHAGOGASTRODUODENOSCOPY (EGD);  Surgeon: Rogene Houston, MD;  [facility];  Service: Endoscopy;  Laterality: N/A;  . HOT HEMOSTASIS N/A 02/27/2013   Procedure: HOT HEMOSTASIS (ARGON PLASMA COAGULATION/BICAP);  Surgeon: Ladene Artist, MD;  Location[facility] ENDOSCOPY;  Service: Endoscopy;  Laterality: N/A;  . NOSE SURGERY     for skin cancer  . POLYPECTOMY  07/28/2017   Procedure: POLYPECTOMY;  Surgeon: Rogene Houston, MD;  [facility];  Service: Endoscopy;;  colon      reports that she has never smoked. She has never used smokeless tobacco. She reports that she does not drink alcohol or use drugs.  Allergies  Allergen Reactions  . Clonazepam Other (See Comments)    Pruritis  . Codeine Nausea And Vomiting and Other (See Comments)    Reaction unknown  . Sulfa Antibiotics Other (See Comments)    Reactions  unknown    Family History  Problem Relation Age of Onset  . Heart attack Father    Prior to Admission medications   Medication Sig Start Date End Date Taking? Authorizing Provider  acetaminophen (TYLENOL) 325 MG tablet Take 650 mg by mouth every 6 (six) hours as needed for mild pain or moderate pain.   Yes [provider]  ALPRAZolam (XANAX) 0.25 MG tablet Take 1 tablet (0.25 mg total) by mouth 2 (two) times daily as needed. for anxiety 04/20/19  Yes Manuella Ghazi, Pratik D, DO  amiodarone (PACERONE) 200 MG tablet Take 200 mg by mouth 2 (two) times daily.   Yes [provider]  dicyclomine (BENTYL) 10 MG capsule Take 10 mg by mouth every 4 (four) hours as needed for spasms.   Yes [provider]  diltiazem (CARDIZEM SR) 90 MG 12 hr capsule Take 1 capsule (90 mg total) by mouth 2 (two) times daily. 04/03/19  Yes Kroeger, Daleen Snook M., PA-C  furosemide (LASIX) 40 MG tablet Take 20-40 mg by mouth See admin instructions. Taking 40 mg in am, and 20 mg every evening   Yes [provider]  levocetirizine (XYZAL) 5 MG tablet Take 5 mg by mouth every evening.   Yes [provider]  levothyroxine (SYNTHROID) 25 MCG tablet Take 25 mcg by mouth daily before breakfast.   Yes [provider]  meclizine (ANTIVERT) 25 MG tablet Take 25 mg by mouth daily as needed for dizziness.  07/11/17  Yes [provider]  metoprolol succinate (TOPROL-XL) 50 MG 24 hr tablet Take 1 tablet (50 mg total) by mouth every morning. Take with or immediately following a meal. 04/16/19 07/15/19 Yes Herminio Commons, MD  Multiple Vitamin (MULTIVITAMIN WITH MINERALS) TABS tablet Take 1 tablet by mouth daily. 03/19/19  Yes Tat, Shanon Brow, MD  ondansetron (ZOFRAN) 4 MG tablet Take 4 mg by mouth every 8 (eight) hours as needed for nausea or vomiting.   Yes [provider]  potassium chloride SA (KLOR-CON) 20 MEQ tablet Take 20 mEq by mouth daily.   Yes [provider]  vitamin  B-12 (CYANOCOBALAMIN) 500 MCG tablet Take 1 tablet (500 mcg total) by mouth daily. 03/19/19  Deveron Furlong, MD    Physical Exam: Vitals:   04/26/19 2130 04/26/19 2200 04/26/19 2245 04/26/19 2300  BP: 139/87     Pulse:  98 100 (!) 103  Resp: (!) 31 (!) 21 (!) 27 (!) 25  Temp:      TempSrc:      SpO2:  95% 100% 100%  Weight:      Height:        Constitutional: NAD and more comfortable with increased supplemental O2. Eyes: Positive esotropia.  PERRL, lids and conjunctivae normal ENMT: Mucous membranes are moist. Posterior pharynx clear of any exudate or lesions. Neck: normal, supple, no masses, no thyromegaly Respiratory: Decreased breath sounds on bases with bilateral rales.  No wheezing or rhonchi.  Normal respiratory effort. No accessory muscle use.  Cardiovascular: Regular rate and rhythm, no murmurs / rubs /  gallops. No extremity edema. 2+ pedal pulses. No carotid bruits.  Abdomen: Soft, no tenderness, no masses palpated. No hepatosplenomegaly. Bowel sounds positive.  Musculoskeletal: no clubbing / cyanosis. Good ROM, no contractures. Normal muscle tone.  Skin: Small areas of ecchymosis on extremities. Neurologic: CN 2-12 grossly intact. Sensation intact, DTR normal. Strength 5/5 in all 4.  Psychiatric: Normal judgment and insight. Alert and oriented x 3. Normal mood.   Labs on Admission: I have personally reviewed following labs and imaging studies  CBC: Recent Labs  Lab 04/20/19 0429 04/26/19 1947  WBC 6.3 10.0  NEUTROABS  --  7.4  HGB 8.6* 10.3*  HCT 28.6* 35.8*  MCV 93.5 97.3  PLT 232 [id number]   Basic Metabolic Panel: Recent Labs  Lab 04/20/19 0429 04/26/19 1947  NA 140 134*  K 3.4* 4.7  CL 111 103  CO2 21* 22  GLUCOSE 93 112*  BUN 26* 26*  CREATININE 1.14* 1.26*  CALCIUM 8.5* 8.3*  MG 1.9  --    GFR: Estimated Creatinine Clearance: 25.2 mL/min (A) (by C-G formula based on SCr of 1.26 mg/dL (H)). Liver Function Tests: Recent Labs  Lab 04/26/19 1947    AST 48*  ALT 82*  ALKPHOS 100  BILITOT 0.2*  PROT 7.0  ALBUMIN 3.4*   No results for input(s): LIPASE, AMYLASE in the last 168 hours. No results for input(s): AMMONIA in the last 168 hours. Coagulation Profile: No results for input(s): INR, PROTIME in the last 168 hours. Cardiac Enzymes: No results for input(s): CKTOTAL, CKMB, CKMBINDEX, TROPONINI in the last 168 hours. BNP (last 3 results) No results for input(s): PROBNP in the last 8760 hours. HbA1C: No results for input(s): HGBA1C in the last 72 hours. CBG: No results for input(s): GLUCAP in the last 168 hours. Lipid Profile: No results for input(s): CHOL, HDL, LDLCALC, TRIG, CHOLHDL, LDLDIRECT in the last 72 hours. Thyroid Function Tests: No results for input(s): TSH, T4TOTAL, FREET4, T3FREE, THYROIDAB in the last 72 hours. Anemia Panel: No results for input(s): VITAMINB12, FOLATE, FERRITIN, TIBC, IRON, RETICCTPCT in the last 72 hours. Urine analysis:    Component Value Date/Time   COLORURINE YELLOW 01/27/2019 1533   APPEARANCEUR CLOUDY (A) 01/27/2019 1533   LABSPEC 1.005 01/27/2019 1533   PHURINE 7.0 01/27/2019 1533   GLUCOSEU NEGATIVE 01/27/2019 1533   HGBUR SMALL (A) 01/27/2019 1533   BILIRUBINUR NEGATIVE 01/27/2019 1533   [place] 01/27/2019 1533   PROTEINUR NEGATIVE 01/27/2019 1533   UROBILINOGEN 0.2 08/22/2014 1300   NITRITE NEGATIVE 01/27/2019 1533   LEUKOCYTESUR LARGE (A) 01/27/2019 1533    Radiological Exams on Admission: CT Chest W Contrast  Result Date: 04/26/2019 CLINICAL DATA:  Pneumonia, effusions suspected. EXAM: CT CHEST WITH CONTRAST TECHNIQUE: Multidetector CT imaging of the chest was performed during intravenous contrast administration. CONTRAST:  37mL OMNIPAQUE IOHEXOL 300 MG/ML  SOLN COMPARISON:  Chest x-ray today.  Chest CT 04/06/2019 FINDINGS: Cardiovascular: Heart is enlarged. Aorta normal caliber with scattered calcifications. No filling defects in the pulmonary arteries to suggest  pulmonary emboli. Mediastinum/Nodes: No mediastinal, hilar, or axillary adenopathy. Scattered small mediastinal lymph nodes. Lungs/Pleura: Enlarging bilateral pleural effusions since prior chest CT. Right pleural effusion is loculated. Compressive atelectasis in the left lower lobe. Bandlike opacity in the right lower lobe, lingula and right middle lobe, likely areas of atelectasis. There is peribronchial thickening diffusely. Biapical scarring. Upper Abdomen: Imaging into the upper abdomen shows no acute findings. Musculoskeletal: Chest wall soft tissues are unremarkable. No acute bony abnormality. IMPRESSION:  Small to moderate bilateral pleural effusions which have increased in size since prior chest CT. Right pleural effusion is loculated. Areas of atelectasis in the lower lobes bilaterally as well as right middle lobe and lingula. Peribronchial thickening compatible with bronchitis. Cardiomegaly. Electronically Signed   By: Rolm Baptise M.D.   On: 04/26/2019 21:54   DG Chest Port 1 View  Result Date: 04/26/2019 CLINICAL DATA:  86 year old female with confusion, "Hot flashes" which the patient has complained of when in atrial fibrillation previously. EXAM: PORTABLE CHEST 1 VIEW COMPARISON:  Portable chest 04/17/2019 and earlier. FINDINGS: Portable AP upright view at 1859 hours. Stable cardiomegaly and mediastinal contours. Hazy and patchy opacity in the right lung which might appear similar to that on 04/05/2019 portable chest. But increased streaky right infrahilar opacity. Pulmonary vascularity appears decreased from 04/17/2019. No pneumothorax. Small bilateral pleural effusions seen by CTA on 04/06/2019 have been poorly visible radiographically. No confluent left lung opacity. Stable visualized osseous structures. Negative visible bowel gas pattern. IMPRESSION: 1. Nonspecific increased streaky right lung opacity since 04/17/2019. Favor increased atelectasis but acute infection is difficult to exclude. 2.  Otherwise stable including cardiomegaly, small pleural effusions which were better demonstrated by CTA earlier this month. Electronically Signed   By: Genevie Ann M.D.   On: 04/26/2019 19:08   12/2018 echo  IMPRESSIONS   1. Left ventricular ejection fraction, by visual estimation, is 60 to 65%. The left ventricle has normal function. There is no left ventricular hypertrophy.  2. Elevated left atrial pressure.  3. Left ventricular diastolic parameters are consistent with Grade II diastolic dysfunction (pseudonormalization).  4. Global right ventricle has normal systolic function.The right ventricular size is normal. No increase in right ventricular wall thickness.  5. Left atrial size was severely dilated.  6. Right atrial size was mildly dilated.  7. The mitral valve is abnormal. Moderate mitral valve regurgitation. No evidence of mitral stenosis.  8. The MR vena contracta is 0.6 cm. The MV/AV TVI ratio is 1.1. Findings support moderate mitral regurgitation.  9. The tricuspid valve is normal in structure. Tricuspid valve regurgitation is mild. 10. The aortic valve is tricuspid. Aortic valve regurgitation is not visualized. No evidence of aortic valve sclerosis or stenosis. 11. The pulmonic valve was not well visualized. Pulmonic valve regurgitation is mild. 12. Moderately elevated pulmonary artery systolic pressure. 13. The inferior vena cava is normal in size with greater than 50% respiratory variability, suggesting right atrial pressure of 3 mmHg.  EKG: Independently reviewed. Vent. rate 95 BPM PR interval * ms QRS duration 142 ms QT/QTc 398/501 ms P-R-T axes * -65 73 Atrial fibrillation RBBB and LAFB  Assessment/Plan Principal Problem:   Hypoxia   Acute on chronic diastolic congestive heart failure (HCC) Observation/telemetry. Continue supplemental oxygen. Furosemide 20 mg IVP x1. Then furosemide 20 mg IVP daily. Resume metoprolol in a.m.  Active Problems:    Hypertension Cardizem 90 mg p.o. twice daily. Continue metoprolol succinate 50 mg p.o. daily. Monitor blood pressure and heart rate.    Type 2 diabetes mellitus (HCC)   Bilateral pleural effusion Continue furosemide. Incentive spirometry.    Permanent atrial fibrillation (HCC) CHA?DS?-VASc Score of at least 6. Off anticoagulation due to GI bleed risk. Continue amiodarone, beta and calcium channel blocker.    Normocytic anemia Stable. Monitor H&H.   DVT prophylaxis: Lovenox SQ. Code Status: DNR. Family Communication: Disposition Plan: Observation for acute diastolic CHF treatment. Consults called: Admission status: Observation/telemetry.   Reubin Milan MD [facility]  If 7PM-7AM, please contact night-coverage [email or web address]  04/26/2019, 11:51 PM   This document was prepared using Dragon voice recognition software and may contain some unintended transcription errors.

## 2019-04-27 ENCOUNTER — Telehealth: Payer: Self-pay | Admitting: Cardiovascular Disease

## 2019-04-27 DIAGNOSIS — Z515 Encounter for palliative care: Secondary | ICD-10-CM | POA: Diagnosis not present

## 2019-04-27 DIAGNOSIS — E1122 Type 2 diabetes mellitus with diabetic chronic kidney disease: Secondary | ICD-10-CM | POA: Diagnosis present

## 2019-04-27 DIAGNOSIS — J90 Pleural effusion, not elsewhere classified: Secondary | ICD-10-CM | POA: Diagnosis not present

## 2019-04-27 DIAGNOSIS — N1832 Chronic kidney disease, stage 3b: Secondary | ICD-10-CM | POA: Diagnosis present

## 2019-04-27 DIAGNOSIS — I5033 Acute on chronic diastolic (congestive) heart failure: Secondary | ICD-10-CM | POA: Diagnosis present

## 2019-04-27 DIAGNOSIS — E43 Unspecified severe protein-calorie malnutrition: Secondary | ICD-10-CM | POA: Diagnosis present

## 2019-04-27 DIAGNOSIS — E785 Hyperlipidemia, unspecified: Secondary | ICD-10-CM | POA: Diagnosis present

## 2019-04-27 DIAGNOSIS — I4821 Permanent atrial fibrillation: Secondary | ICD-10-CM | POA: Diagnosis present

## 2019-04-27 DIAGNOSIS — R0902 Hypoxemia: Secondary | ICD-10-CM | POA: Diagnosis present

## 2019-04-27 DIAGNOSIS — Z79899 Other long term (current) drug therapy: Secondary | ICD-10-CM | POA: Diagnosis not present

## 2019-04-27 DIAGNOSIS — Z85048 Personal history of other malignant neoplasm of rectum, rectosigmoid junction, and anus: Secondary | ICD-10-CM | POA: Diagnosis not present

## 2019-04-27 DIAGNOSIS — I4819 Other persistent atrial fibrillation: Secondary | ICD-10-CM | POA: Diagnosis not present

## 2019-04-27 DIAGNOSIS — J9621 Acute and chronic respiratory failure with hypoxia: Secondary | ICD-10-CM | POA: Diagnosis present

## 2019-04-27 DIAGNOSIS — J810 Acute pulmonary edema: Secondary | ICD-10-CM | POA: Diagnosis not present

## 2019-04-27 DIAGNOSIS — E1143 Type 2 diabetes mellitus with diabetic autonomic (poly)neuropathy: Secondary | ICD-10-CM

## 2019-04-27 DIAGNOSIS — I130 Hypertensive heart and chronic kidney disease with heart failure and stage 1 through stage 4 chronic kidney disease, or unspecified chronic kidney disease: Secondary | ICD-10-CM | POA: Diagnosis present

## 2019-04-27 DIAGNOSIS — I10 Essential (primary) hypertension: Secondary | ICD-10-CM

## 2019-04-27 DIAGNOSIS — Z7189 Other specified counseling: Secondary | ICD-10-CM | POA: Diagnosis not present

## 2019-04-27 DIAGNOSIS — R627 Adult failure to thrive: Secondary | ICD-10-CM | POA: Diagnosis present

## 2019-04-27 DIAGNOSIS — Z66 Do not resuscitate: Secondary | ICD-10-CM | POA: Diagnosis present

## 2019-04-27 DIAGNOSIS — J9601 Acute respiratory failure with hypoxia: Secondary | ICD-10-CM | POA: Diagnosis not present

## 2019-04-27 DIAGNOSIS — Z8542 Personal history of malignant neoplasm of other parts of uterus: Secondary | ICD-10-CM | POA: Diagnosis not present

## 2019-04-27 DIAGNOSIS — Z79890 Hormone replacement therapy (postmenopausal): Secondary | ICD-10-CM | POA: Diagnosis not present

## 2019-04-27 DIAGNOSIS — D6489 Other specified anemias: Secondary | ICD-10-CM | POA: Diagnosis not present

## 2019-04-27 DIAGNOSIS — E876 Hypokalemia: Secondary | ICD-10-CM | POA: Diagnosis present

## 2019-04-27 DIAGNOSIS — Z85828 Personal history of other malignant neoplasm of skin: Secondary | ICD-10-CM | POA: Diagnosis not present

## 2019-04-27 DIAGNOSIS — D649 Anemia, unspecified: Secondary | ICD-10-CM | POA: Diagnosis present

## 2019-04-27 DIAGNOSIS — Z20822 Contact with and (suspected) exposure to covid-19: Secondary | ICD-10-CM | POA: Diagnosis present

## 2019-04-27 DIAGNOSIS — I4811 Longstanding persistent atrial fibrillation: Secondary | ICD-10-CM | POA: Diagnosis not present

## 2019-04-27 LAB — SARS CORONAVIRUS 2 (TAT 6-24 HRS): SARS Coronavirus 2: NEGATIVE

## 2019-04-27 LAB — MRSA PCR SCREENING: MRSA by PCR: NEGATIVE

## 2019-04-27 MED ORDER — LEVOTHYROXINE SODIUM 25 MCG PO TABS
25.0000 ug | ORAL_TABLET | Freq: Every day | ORAL | Status: DC
  Administered 2019-04-28 – 2019-05-04 (×7): 25 ug via ORAL
  Filled 2019-04-27 (×7): qty 1

## 2019-04-27 MED ORDER — ACETAMINOPHEN 325 MG PO TABS
650.0000 mg | ORAL_TABLET | Freq: Four times a day (QID) | ORAL | Status: DC | PRN
  Administered 2019-04-30 – 2019-05-03 (×6): 650 mg via ORAL
  Filled 2019-04-27 (×6): qty 2

## 2019-04-27 MED ORDER — PREGABALIN 25 MG PO CAPS
25.0000 mg | ORAL_CAPSULE | Freq: Two times a day (BID) | ORAL | Status: DC
  Administered 2019-04-27 – 2019-05-04 (×14): 25 mg via ORAL
  Filled 2019-04-27 (×15): qty 1

## 2019-04-27 MED ORDER — DICYCLOMINE HCL 10 MG PO CAPS: 10.0000 mg | ORAL_CAPSULE | ORAL | Status: DC | PRN

## 2019-04-27 MED ORDER — ADULT MULTIVITAMIN W/MINERALS CH
1.0000 | ORAL_TABLET | Freq: Every day | ORAL | Status: DC
  Administered 2019-04-27 – 2019-05-04 (×8): 1 via ORAL
  Filled 2019-04-27 (×8): qty 1

## 2019-04-27 MED ORDER — ADULT MULTIVITAMIN W/MINERALS CH: 1.0000 | ORAL_TABLET | Freq: Every day | ORAL | Status: DC

## 2019-04-27 MED ORDER — VITAMIN B-12 100 MCG PO TABS
500.0000 ug | ORAL_TABLET | Freq: Every day | ORAL | Status: DC
  Administered 2019-04-27 – 2019-05-04 (×8): 500 ug via ORAL
  Filled 2019-04-27 (×7): qty 5

## 2019-04-27 MED ORDER — METOPROLOL SUCCINATE ER 50 MG PO TB24
50.0000 mg | ORAL_TABLET | Freq: Every day | ORAL | Status: DC
  Administered 2019-04-27 – 2019-04-30 (×4): 50 mg via ORAL
  Filled 2019-04-27 (×5): qty 1

## 2019-04-27 MED ORDER — FUROSEMIDE 10 MG/ML IJ SOLN
20.0000 mg | Freq: Every day | INTRAMUSCULAR | Status: DC
  Administered 2019-04-27 – 2019-04-28 (×2): 20 mg via INTRAVENOUS
  Filled 2019-04-27 (×2): qty 2

## 2019-04-27 MED ORDER — DILTIAZEM HCL ER 90 MG PO CP12
90.0000 mg | ORAL_CAPSULE | Freq: Two times a day (BID) | ORAL | Status: DC
  Administered 2019-04-27 – 2019-05-04 (×14): 90 mg via ORAL
  Filled 2019-04-27 (×16): qty 1

## 2019-04-27 MED ORDER — ENOXAPARIN SODIUM 30 MG/0.3ML ~~LOC~~ SOLN
30.0000 mg | SUBCUTANEOUS | Status: DC
  Administered 2019-04-27 – 2019-04-28 (×2): 30 mg via SUBCUTANEOUS
  Filled 2019-04-27 (×2): qty 0.3

## 2019-04-27 MED ORDER — ALPRAZOLAM 0.25 MG PO TABS
0.2500 mg | ORAL_TABLET | Freq: Two times a day (BID) | ORAL | Status: DC | PRN
  Administered 2019-04-27 – 2019-05-03 (×8): 0.25 mg via ORAL
  Filled 2019-04-27 (×9): qty 1

## 2019-04-27 MED ORDER — BOOST / RESOURCE BREEZE PO LIQD CUSTOM
1.0000 | Freq: Two times a day (BID) | ORAL | Status: DC
  Administered 2019-04-27 – 2019-05-04 (×6): 1 via ORAL

## 2019-04-27 MED ORDER — METHOCARBAMOL 500 MG PO TABS
500.0000 mg | ORAL_TABLET | Freq: Three times a day (TID) | ORAL | Status: DC | PRN
  Administered 2019-04-27: 500 mg via ORAL
  Filled 2019-04-27: qty 1

## 2019-04-27 MED ORDER — ENSURE ENLIVE PO LIQD
237.0000 mL | Freq: Two times a day (BID) | ORAL | Status: DC
  Administered 2019-04-27: 237 mL via ORAL

## 2019-04-27 NOTE — Telephone Encounter (Signed)
PATIENT's daughter calling. Patient is currently in [facility] and they are wanting to know if Dr Bronson Ing will be coming to see her.  Said that he told them if they ever needed him that they could contact him anytime.  That he would speak with them or come see her.   Said that they were told by nurses that cardiology would be coming by to see her today and they have not yet.   Wants cardiologist to let them know if they should proceed with hospital doctors wanting to pull fluid off.

## 2019-04-27 NOTE — Progress Notes (Signed)
Initial dose of Lyrica given for leg discomfort. Patient reports bilateral legs "burning" 7/10. Denies other needs. Instructed to call as needed. Daughter at bedside. Will continue to monitor.

## 2019-04-27 NOTE — Progress Notes (Signed)
Robaxin 500 mg given po for c/o BLE muscle spasms. Denies other needs. Safety measures intact. Instructed to call prn. Understanding verbalized. CBIR. Will continue to monitor.

## 2019-04-27 NOTE — Telephone Encounter (Signed)
Daughter advised that if a cardiology consult has been placed, the cardiologist covering the hospital would be the one coming in to see patient. Also advised daughter that any concerns they may have can be coordinated through the nursing staff at the hospital. Daughter verbalized understanding of plan.

## 2019-04-27 NOTE — Progress Notes (Signed)
PROGRESS NOTE    Renee Blake  [id number] DOB: 1929-03-07 DOA: 04/26/2019 PCP: [name], MD     Brief Narrative:  As per H&P written by Dr. Olevia Bowens on 04/26/2019 86 y.o. female with medical history significant of history of anemia, chronic atrial fibrillation, chronic diarrhea, dilation of biliary tract, esophageal stricture, duodenal stricture, sliding hiatal hernia, gastric ulcer, history of GI bleed, hyperlipidemia, hypertension, microscopic colitis, nephrolithiasis, pancreatic duct dilation, Schatzki ring, skin cancer, vertigo who was recently admitted from January 16 to January 16 due to diarrhea, malnutrition, weakness and diastolic CHF exacerbation.  She is sent [facility] due to confusion and hot flashes.  Her daughter stated to Dr. Rogene Houston that she usually has these hot flashes episodes when [name] starts acting up.  The patient is unable to elaborate much but has been complaining of lower extremity discomfort.  She has been having increased oxygen requirement from her baseline 2 LPM via [place].  ED Course: Her initial vital signs were temperature 98.1 F, pulse 78, respirations 28, blood pressure 124/82 mmHg and O2 sat 95% on room air.  Hemoglobin 10.3 g/dL and platelets 290.Sodium was 134 mmol/L, all other electrolytes are normal when calcium is corrected to an albumin of 3.4 g/dL.  Glucose 112, BUN 26 and creatinine 1.26 mg/dL.  AST was 48 and ALT was 82 units/L.  The rest of the CMP values are within normal range.  BNP was 947.0 pg/mL.  Her EKG show atrial fibrillation 95 bpm with previous RBBB and LAFB.  Her chest radiograph shows possible atelectasis with small pleural effusions and stable cardiomegaly.  Assessment & Plan: 1-acute on chronic diastolic congestive heart failure ([place]) -Patient felt to be more short of breath at the facility and become confused. -After diuresis has been provided feeling better, answering questions  appropriately oriented x2. -Oral intake continues to be poor. -No significant crackles or lower extremity edema during today's evaluation. -Continue 20 of IV Lasix on daily basis -Resume the use of Cardizem and metoprolol XL 50 mg as per most recent cardiology note -Cardiology has been requested to see patient to help with further care determination.  (Family looking to see patient's primary cardiologist possible). -Continue low-sodium diet, daily weights and strict I's and O's.  2-Hypertension -Stable currently -Follow vital signs.  3-Type 2 diabetes mellitus (HCC) -Most recent A1c 5.9 -No insulin therapy needed at this time.  4-Bilateral pleural effusion -In the setting of third space shifting and acute on chronic diastolic heart failure -Continue diuresis as mentioned above. -On the right side there is a partially loculated effusion which could be amenable for thoracentesis; discussed with daughter at bedside for consent before thoracentesis is done and she would like to have discussion with cardiology service before attempting any further invasive treatment. -Follow further recommendations and clinical response. -Appreciate assistance by registered dietitian nutritional management service and will follow recommendations for feeding supplements.  5-severe protein calorie malnutrition -Patient started on Ensure and will also continue breeze twice a day -Continue daily multivitamin.  6-acute on chronic respiratory failure with hypoxia -In the setting of exacerbation of diastolic heart failure -Continue oxygen supplementation -Continue diuresis -Follow clinical response.  7-Permanent atrial fibrillation [facility]) -Cardiology will see patient will follow recommendation -Not on anticoagulation secondary to life-threatening anemia in the past. -Continue the use of Cardizem and Toprol.  8-Normocytic anemia -No signs of overt bleeding currently -Hemoglobin 10.3 -Follow trend; no  transfusion needed at this time.  9-LE  pain -with concerns for neuropathy -tylenol and PRN robaxin for spasm ordered.    DVT prophylaxis: Lovenox Code Status: DNR Family Communication: Long discussion with daughter at bedside. Disposition Plan: Remains inpatient, continue IV diuretics, resume home medications for rate control and follow cardiology service further recommendations.  At this particular moment appears patient has failed trial on rehabilitation from recent nursing home placement and will involve palliative care to further assist with advance directives.  Consultants:   Cardiology service  Palliative care  Procedures:   See below for x-ray reports  Antimicrobials:  Anti-infectives (From admission, onward)   None      Subjective: No fever, no chest pain, no nausea, no vomiting.  Breathing appears to be stable currently. 2 L nasal cannula supplementation in place with good O2 sat.  Patient reported some lower extremity discomfort.  Chronically ill in appearance and underweight.   Objective: Vitals:   04/27/19 0028 04/27/19 0500 04/27/19 0506 04/27/19 1329  BP: (!) 158/97  118/73 116/81  Pulse: 99  90 92  Resp: 18  18 20   Temp: 97.7 F (36.5 C)  98 F (36.7 C) 97.7 F (36.5 C)  TempSrc: Oral  Oral Oral  SpO2: 98%  97% 100%  Weight: 56.3 kg 56.7 kg    Height: 5\' 5"  (1.651 m)       Intake/Output Summary (Last 24 hours) at 04/27/2019 1445 Last data filed at 04/27/2019 0600 Gross per 24 hour  Intake 120 ml  Output --  Net 120 ml   Filed Weights   04/26/19 1844 04/27/19 0028 04/27/19 0500  Weight: 51.7 kg 56.3 kg 56.7 kg    Examination: General exam: Alert, awake, oriented x 2; no chest pain, no nausea, no vomiting.  Patient reported some bilateral lower extremity discomfort. Respiratory system: No wheezing, no frank crackles.  Decreased breath sounds at the bases.  No using accessory muscles.  Good O2 sat on chronic 2 L nasal cannula  supplementation. Cardiovascular system: irregular, no rubs, no gallops, no JVD on exam. Gastrointestinal system: Abdomen is nondistended, soft and nontender. No organomegaly or masses felt. Normal bowel sounds heard. Central nervous system: Alert and oriented. No focal neurological deficits. Extremities: No cyanosis, no clubbing, no edema appreciated. Skin: No rashes, no petechiae. Psychiatry: Mood & affect appropriate.    Data Reviewed: I have personally reviewed following labs and imaging studies  CBC: Recent Labs  Lab 04/26/19 1947  WBC 10.0  NEUTROABS 7.4  HGB 10.3*  HCT 35.8*  MCV 97.3  PLT 123456   Basic Metabolic Panel: Recent Labs  Lab 04/26/19 1947  NA 134*  K 4.7  CL 103  CO2 22  GLUCOSE 112*  BUN 26*  CREATININE 1.26*  CALCIUM 8.3*   GFR: Estimated Creatinine Clearance: 27.6 mL/min (A) (by C-G formula based on SCr of 1.26 mg/dL (H)).   Liver Function Tests: Recent Labs  Lab 04/26/19 1947  AST 48*  ALT 82*  ALKPHOS 100  BILITOT 0.2*  PROT 7.0  ALBUMIN 3.4*   Urine analysis:    Component Value Date/Time   COLORURINE YELLOW 01/27/2019 1533   APPEARANCEUR CLOUDY (A) 01/27/2019 1533   LABSPEC 1.005 01/27/2019 1533   PHURINE 7.0 01/27/2019 1533   GLUCOSEU NEGATIVE 01/27/2019 1533   HGBUR SMALL (A) 01/27/2019 1533   BILIRUBINUR NEGATIVE 01/27/2019 [place] 01/27/2019 1533   PROTEINUR NEGATIVE 01/27/2019 1533   UROBILINOGEN 0.2 08/22/2014 1300   NITRITE NEGATIVE 01/27/2019 1533   LEUKOCYTESUR LARGE (A) 01/27/2019  1533    Recent Results (from the past 240 hour(s))  SARS CORONAVIRUS 2 (TAT 6-24 HRS) Nasopharyngeal Nasopharyngeal Swab     Status: None   Collection Time: 04/17/19  7:38 PM   Specimen: Nasopharyngeal Swab  Result Value Ref Range Status   SARS Coronavirus 2 NEGATIVE NEGATIVE Final    Comment: (NOTE) SARS-CoV-2 target nucleic acids are NOT DETECTED. The SARS-CoV-2 RNA is generally detectable in upper and  lower respiratory specimens during the acute phase of infection. Negative results do not preclude SARS-CoV-2 infection, do not rule out co-infections with other pathogens, and should not be used as the sole basis for treatment or other patient management decisions. Negative results must be combined with clinical observations, patient history, and epidemiological information. The expected result is Negative. Fact Sheet for Patients: SugarRoll.be Fact Sheet for Healthcare Providers: https://www.woods-mathews.com/ This test is not yet approved or cleared by the [place] FDA and  has been authorized for detection and/or diagnosis of SARS-CoV-2 by FDA under an Emergency Use Authorization (EUA). This EUA will remain  in effect (meaning this test can be used) for the duration of the COVID-19 declaration under Section 56 4(b)(1) of the Act, 21 U.S.C. section 360bbb-3(b)(1), unless the authorization is terminated or revoked sooner. Performed at [facility], [place] [address], [place], [place] [address]   SARS CORONAVIRUS 2 (TAT 6-24 HRS) Nasopharyngeal Nasopharyngeal Swab     Status: None   Collection Time: 04/20/19  1:23 PM   Specimen: Nasopharyngeal Swab  Result Value Ref Range Status   SARS Coronavirus 2 NEGATIVE NEGATIVE Final    Comment: (NOTE) SARS-CoV-2 target nucleic acids are NOT DETECTED. The SARS-CoV-2 RNA is generally detectable in upper and lower respiratory specimens during the acute phase of infection. Negative results do not preclude SARS-CoV-2 infection, do not rule out co-infections with other pathogens, and should not be used as the sole basis for treatment or other patient management decisions. Negative results must be combined with clinical observations, patient history, and epidemiological information. The expected result is Negative. Fact Sheet for Patients: SugarRoll.be Fact Sheet for  Healthcare Providers: https://www.woods-mathews.com/ This test is not yet approved or cleared by the [place] FDA and  has been authorized for detection and/or diagnosis of SARS-CoV-2 by FDA under an Emergency Use Authorization (EUA). This EUA will remain  in effect (meaning this test can be used) for the duration of the COVID-19 declaration under Section 56 4(b)(1) of the Act, 21 U.S.C. section 360bbb-3(b)(1), unless the authorization is terminated or revoked sooner. Performed at [facility], [place] [address], [place], [place] [address]   SARS CORONAVIRUS 2 (TAT 6-24 HRS) Nasopharyngeal Nasopharyngeal Swab     Status: None   Collection Time: 04/26/19 10:52 PM   Specimen: Nasopharyngeal Swab  Result Value Ref Range Status   SARS Coronavirus 2 NEGATIVE NEGATIVE Final    Comment: (NOTE) SARS-CoV-2 target nucleic acids are NOT DETECTED. The SARS-CoV-2 RNA is generally detectable in upper and lower respiratory specimens during the acute phase of infection. Negative results do not preclude SARS-CoV-2 infection, do not rule out co-infections with other pathogens, and should not be used as the sole basis for treatment or other patient management decisions. Negative results must be combined with clinical observations, patient history, and epidemiological information. The expected result is Negative. Fact Sheet for Patients: SugarRoll.be Fact Sheet for Healthcare Providers: https://www.woods-mathews.com/ This test is not yet approved or cleared by the [place] FDA and  has been authorized for  detection and/or diagnosis of SARS-CoV-2 by FDA under an Emergency Use Authorization (EUA). This EUA will remain  in effect (meaning this test can be used) for the duration of the COVID-19 declaration under Section 56 4(b)(1) of the Act, 21 U.S.C. section 360bbb-3(b)(1), unless the authorization is terminated or revoked  sooner. Performed at [facility], [place] [address], [place], [place] [address]   MRSA PCR Screening     Status: None   Collection Time: 04/27/19  1:25 AM   Specimen: Nasal Mucosa; Nasopharyngeal  Result Value Ref Range Status   MRSA by PCR NEGATIVE NEGATIVE Final    Comment:        The GeneXpert MRSA Assay (FDA approved for NASAL specimens only), is one component of a comprehensive MRSA colonization surveillance program. It is not intended to diagnose MRSA infection nor to guide or monitor treatment for MRSA infections. Performed at [facility], [address] [place], [place] [address]      Radiology Studies: CT Chest W Contrast  Result Date: 04/26/2019 CLINICAL DATA:  Pneumonia, effusions suspected. EXAM: CT CHEST WITH CONTRAST TECHNIQUE: Multidetector CT imaging of the chest was performed during intravenous contrast administration. CONTRAST:  27mL OMNIPAQUE IOHEXOL 300 MG/ML  SOLN COMPARISON:  Chest x-ray today.  Chest CT 04/06/2019 FINDINGS: Cardiovascular: Heart is enlarged. Aorta normal caliber with scattered calcifications. No filling defects in the pulmonary arteries to suggest pulmonary emboli. Mediastinum/Nodes: No mediastinal, hilar, or axillary adenopathy. Scattered small mediastinal lymph nodes. Lungs/Pleura: Enlarging bilateral pleural effusions since prior chest CT. Right pleural effusion is loculated. Compressive atelectasis in the left lower lobe. Bandlike opacity in the right lower lobe, lingula and right middle lobe, likely areas of atelectasis. There is peribronchial thickening diffusely. Biapical scarring. Upper Abdomen: Imaging into the upper abdomen shows no acute findings. Musculoskeletal: Chest wall soft tissues are unremarkable. No acute bony abnormality. IMPRESSION: Small to moderate bilateral pleural effusions which have increased in size since prior chest CT. Right pleural effusion is loculated. Areas of atelectasis in the lower lobes bilaterally as well  as right middle lobe and lingula. Peribronchial thickening compatible with bronchitis. Cardiomegaly. Electronically Signed   By: Rolm Baptise M.D.   On: 04/26/2019 21:54   DG Chest Port 1 View  Result Date: 04/26/2019 CLINICAL DATA:  86 year old female with confusion, "Hot flashes" which the patient has complained of when in atrial fibrillation previously. EXAM: PORTABLE CHEST 1 VIEW COMPARISON:  Portable chest 04/17/2019 and earlier. FINDINGS: Portable AP upright view at 1859 hours. Stable cardiomegaly and mediastinal contours. Hazy and patchy opacity in the right lung which might appear similar to that on 04/05/2019 portable chest. But increased streaky right infrahilar opacity. Pulmonary vascularity appears decreased from 04/17/2019. No pneumothorax. Small bilateral pleural effusions seen by CTA on 04/06/2019 have been poorly visible radiographically. No confluent left lung opacity. Stable visualized osseous structures. Negative visible bowel gas pattern. IMPRESSION: 1. Nonspecific increased streaky right lung opacity since 04/17/2019. Favor increased atelectasis but acute infection is difficult to exclude. 2. Otherwise stable including cardiomegaly, small pleural effusions which were better demonstrated by CTA earlier this month. Electronically Signed   By: Genevie Ann M.D.   On: 04/26/2019 19:08    Scheduled Meds: . enoxaparin (LOVENOX) injection  30 mg Subcutaneous Q24H  . feeding supplement  1 Container Oral BID BM  . furosemide  20 mg Intravenous Daily  . multivitamin with minerals  1 tablet Oral Daily   Continuous Infusions:   LOS: 0 days  Time spent: 30 minutes.    Barton Dubois, MD [facility] Pager [phone]   04/27/2019, 2:45 PM

## 2019-04-27 NOTE — Progress Notes (Signed)
Initial Nutrition Assessment  DOCUMENTATION CODES:   Severe malnutrition in context of chronic illness  INTERVENTION:  -Discontinue Ensure Enlive po BID, each supplement provides 350 kcal and 20 grams of protein  -Provide Boost Breeze po BID, each supplement provides 250 kcal and 9 grams of protein  -MVI with minerals daily  -Liberalize diet    NUTRITION DIAGNOSIS:   Severe Malnutrition related to chronic illness(CHF) as evidenced by per patient/family report, energy intake < 75% for > or equal to 1 month, moderate fat depletion, severe fat depletion, moderate muscle depletion, severe muscle depletion.    GOAL:   Patient will meet greater than or equal to 90% of their needs   MONITOR:   PO intake, Supplement acceptance, Diet advancement, Weight trends, Labs, I & O's, Skin  REASON FOR ASSESSMENT:   Malnutrition Screening Tool    ASSESSMENT:  86 year old female with past medical history of anemia, chronic atrial fibrillation, chronic diastolic CHF on 2L at baseline, chronic diarrhea, dilation of biliary tract, esophageal stricture, duodenal stricture, sliding hiatal hernia, gastric ulcer, h/o GI bleed, HLD, HTN, microscopic colitis, nephrolithiasis, pancreatic duct dilation, Schatzki ring, recently discharged 1/16 for diarrhea, malnutrition, weakness, and CHF exacerbation. Patient presented from [facility] with confusion and hot flashes and complains of lower extremity discomfort and admitted for acute on chronic diastolic CHF exacerbation.  Met with pt at bedside this morning, daughter in room. [place] bag on bedside tray, pt reports eating most of the tomato biscuit brought in by daughter for breakfast. Patient recalls 3 meals and 2 snacks daily provided by facility, stated that she normally doesn't eat very much secondary to chronic diarrhea 2-3 hours after meals and endorses ongoing decreased appetite since returning to SNF s/p 1/19 [facility] discharge.   Patient  meets criteria for severe malnutrition in the context of chronic disease given dietary recall and mod-severe fat/muscle depletion noted on NFPE. Patient recalls consuming 1 Boost/day and reports severe diarrhea with Ensure supplements. Per medications, pt ordered Ensure BID. RD will discontinue, pt amenable to [facility] during admission. Currently, pt on HH/CM/1200 ml diet, no meal documentation this admission; outside food brought for breakfast. Patient with stable h/o of DM, last A1c 5.9 in 2016 per lab results review. Blood glucose 112, noted 87-93 during last admission. Would recommend liberalizing diet to HH/1200 ml to increase meal options, encouraging of po intake.   Non-pitting RLE, mild pitting LLE edema noted per RN flowsheet.  UBW 114-116 lbs per daughter report Current wt 124.74 lbs Weight history reviewed, trending up over the past month. Noted 11 lb wt gain in the past 2 weeks, likely related to fluid status. Will use UBW (51.8 kg) for estimating needs.   Medications reviewed and include: Lovenox, Lasix IV 20 mg daily  Labs: BG 112, Na 134 (L), Cr 1.26 (H), BNP 947 (H)   NUTRITION - FOCUSED PHYSICAL EXAM: 1/26 Findings: Moderate fat depletion to buccal region, severe fat depletion to orbital and upper arm region; moderate muscle depletions to temple and dorsal hand; severe muscle depletion to clavicle, anterior thigh, patellar, and posterior calf regions.   Diet Order:   Diet Order            Diet Heart Room service appropriate? Yes; Fluid consistency: Thin; Fluid restriction: 1200 mL Fluid  Diet effective now              EDUCATION NEEDS:   Education needs have been addressed  Skin:  Skin Assessment: Reviewed RN Assessment  Last BM:  1/25  Height:   Ht Readings from Last 1 Encounters:  04/27/19 5' 5"  (1.651 m)    Weight:   Wt Readings from Last 1 Encounters:  04/27/19 56.7 kg    Ideal Body Weight:  56.8 kg  BMI:  Body mass index is 20.8  kg/m.  Estimated Nutritional Needs:   Kcal:  1550-1800  Protein:  80-90  Fluid:  1.2 L/day per MD   Lajuan Lines, RD, LDN Clinical Nutrition Office Telephone [phone] After Hours/Weekend Pager: [phone]

## 2019-04-28 ENCOUNTER — Telehealth: Payer: Self-pay | Admitting: *Deleted

## 2019-04-28 DIAGNOSIS — Z515 Encounter for palliative care: Secondary | ICD-10-CM

## 2019-04-28 DIAGNOSIS — Z7189 Other specified counseling: Secondary | ICD-10-CM

## 2019-04-28 DIAGNOSIS — R627 Adult failure to thrive: Secondary | ICD-10-CM

## 2019-04-28 DIAGNOSIS — I4819 Other persistent atrial fibrillation: Secondary | ICD-10-CM

## 2019-04-28 DIAGNOSIS — I5033 Acute on chronic diastolic (congestive) heart failure: Secondary | ICD-10-CM

## 2019-04-28 DIAGNOSIS — D6489 Other specified anemias: Secondary | ICD-10-CM

## 2019-04-28 LAB — BASIC METABOLIC PANEL
Anion gap: 8 (ref 5–15)
BUN: 17 mg/dL (ref 8–23)
CO2: 24 mmol/L (ref 22–32)
Calcium: 8.5 mg/dL — ABNORMAL LOW (ref 8.9–10.3)
Chloride: 106 mmol/L (ref 98–111)
Creatinine, Ser: 1.01 mg/dL — ABNORMAL HIGH (ref 0.44–1.00)
GFR calc Af Amer: 58 mL/min — ABNORMAL LOW (ref >60)
GFR calc non Af Amer: 50 mL/min — ABNORMAL LOW (ref >60)
Glucose, Bld: 82 mg/dL (ref 70–99)
Potassium: 3.5 mmol/L (ref 3.5–5.1)
Sodium: 138 mmol/L (ref 135–145)

## 2019-04-28 MED ORDER — FUROSEMIDE 10 MG/ML IJ SOLN
40.0000 mg | Freq: Every day | INTRAMUSCULAR | Status: DC
  Administered 2019-04-29 – 2019-04-30 (×2): 40 mg via INTRAVENOUS
  Filled 2019-04-28 (×2): qty 4

## 2019-04-28 MED ORDER — ENOXAPARIN SODIUM 40 MG/0.4ML ~~LOC~~ SOLN
40.0000 mg | SUBCUTANEOUS | Status: DC
  Administered 2019-04-29 – 2019-05-02 (×4): 40 mg via SUBCUTANEOUS
  Filled 2019-04-28 (×4): qty 0.4

## 2019-04-28 MED ORDER — ALPRAZOLAM 0.25 MG PO TABS
0.2500 mg | ORAL_TABLET | Freq: Once | ORAL | Status: AC
  Administered 2019-04-28: 0.25 mg via ORAL
  Filled 2019-04-28: qty 1

## 2019-04-28 NOTE — Progress Notes (Signed)
Palliative: Renee Blake is resting quietly in bed.  She greets me making and keeping eye contact.  She appears chronically ill and somewhat frail.  She is alert and oriented x3, able to make her basic needs known.  There is no family at bedside at this time.  We talked about her home life.  She tells me that she lives in a home with her daughter Renee Blake, for about 4 or 5 years now.  She has 5 children total, and shares that she feels that she may move in with her granddaughter Renee Blake, "to give Renee Blake a break".  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  She tells me that she quit work after her husband became ill, she also moved her mother into her home and cared for her.  We talked about what brings her into the hospital.  Renee Blake shares that she does okay initially at home, but then gets fluid overloaded and needs to be rehospitalized.  I asked if she takes extra fluid pills at home when she gets fluid overload, but she tells me that she does not.  We also talked about fluid restrictions and although she shares that she drinks "a quart" or less, she is somewhat hesitant, and I am not sure that she is accurate.  We talked about support at home through hospice, having registered nurse to help with symptom management, aids to help with bathing.  At this point she tells me that she would like to talk to her children about her options.  She declines to have PMT call her daughter Renee Blake.  Plan:   Continue to treat the treatable but no CPR, no intubation.  Anticipate Renee Blake will continue to [facility] as she is not able to adequately manage her heart failure at home..      57 minutes Renee Axe, NP Palliative Medicine Team Team Phone # [phone] Greater than 50% of this time was spent counseling and coordinating care related to the above assessment and plan.

## 2019-04-28 NOTE — Progress Notes (Signed)
PROGRESS NOTE  CHANNELLE LANGILL [id number] DOB: 1928/04/23 DOA: 04/26/2019 PCP: [name], MD  Brief History:  86 year old female with a history of permanent atrial fibrillation not on anticoagulation secondary to GI bleed, diastolic CHF, CKD stage III, endometrial carcinoma, and squamous cell carcinoma of the anal canal presenting from [facility] with  shortness of breath, nausea, leg edema, and hot flashes.  She was recently admitted from 03/13/19 to 03/18/19 with acute on chronic dCHF.  Her d/c weight was 112.8 lbs, and she was d/ced with lasix 40 mg daily. Initial labs showed WBC 6.3, Hgb 8.6, platelets 232, Na+ 140, K+ 3.4 and creatinine 1.14. Mg 1.9. BNP 947.  COVID negative. CXR showed increased streaky right lung opacity thought to be most consistent with atelectasis but infection could not be ruled-out. Chest CT showed small to moderate bilateral pleural effusions, areas of atelectasis and peribronchial thickening compatible with bronchitis. EKG shows atrial fibrillation, HR 80 with RBBB and LAFB.   She was admitted for an acute CHF exacerbation and has been started on IV Lasix 20mg . Strict I&O's have not been recorded but she had 4 urine occurrences yesterday. Weight listed as 125 lbs on admission, at 123 lbs today.   Assessment/Plan: Acute respiratory failure with hypoxia -Secondary to mild exacerbation of diastolic CHF -Patient was successfully weaned off 3 L of oxygen to room air -now weaned to RA  Acute on chronic diastolic CHF -appreciate cardiology follow up -continue IV lasix and metoprolol succinate -IV lasix increased to 40 mg IV -01/19/19 Echo EF 60-65%, G2DD, moderate elevated PASP -03/17/20 discharge weight 112.8 lbs  Permanent Atrial Fibrillation -continue diltiazem SR and metoprolol succinate -amiodarone stopped during December 2020 admission -not on [facility] due to GIB,duodenal ulcers, anemia  Nausea/hot flash/FTT -Post ingestion of  morning meds developing nausea, hot flashes, but no persistent vomiting -evaluate medication and dividedthe doses -As needed antiemetics -No signs of infection -History of GI bleed,>> H&H remained stable -repeat Hemoccult--neg -CEA normal at 4.4 -03/18/19 TSH 13.447 -03/18/19 Free T4--1.07 -check B12--150 on 03/18/19 -folate 13.4  CKD stage 3b -baseline creatinine 1.2-1.4 -am BMP  History of SCC anorectal cancer/endometrial carcinoma/ -Hemoccult--neg -Stable -CEA normal at 4.4 -patient followed by rad oncology, GYN and medical oncology team from [facility] by [place]. No recent follow-up -12/16 CT abd/pelvis--no acute findings, no mets, nonobstructine R-renal calculi -Last Colonoscopy:April 2019-new findings include nodular hard mass anterior anal orifice, DRE revealed 5 cm hard rectal masscircumferential anterior bowel wall, 6 mm polyp distal sigmoid. Path w/rectum distal lesion biopsy-at least high-grade squamous intraepithelial lesion suspicious for invasion, sigmoid polyp inflammatory  Goals of Care -Advance care planning, including the explanation and discussion of advance directives was carried out with the patient and family.  Code status including explanations of "Full Code" and "DNR" and alternatives were discussed in detail.  Discussion of end-of-life issues including but not limited palliative care, hospice care and the concept of hospice, other end-of-life care options, power of attorney for health care decisions, living wills, and physician orders for life-sustaining treatment were also discussed with the patient and family.  Total face to face time 16 minutes. -consult palliative med          Disposition Plan:   SNF 2-3 days  Family Communication:   Daughter updated at bedside 1/27  Consultants:  cardiology  Code Status:  DNR  DVT Prophylaxis:  [place] Lovenox   Procedures: As Listed in Progress Note Above  Antibiotics: None    Total time spent 35  minutes.  Greater than 50% spent face to face counseling and coordinating care.    Subjective: Patient denies fevers, chills, headache, chest pain, dyspnea, nausea, vomiting, diarrhea, abdominal pain, dysuria, hematuria, hematochezia, and melena.   Objective: Vitals:   04/27/19 1957 04/27/19 2137 04/28/19 0500 04/28/19 0639  BP:  119/71  127/72  Pulse:  75  95  Resp:  18  18  Temp:  98.2 F (36.8 C)  98.1 F (36.7 C)  TempSrc:  Oral    SpO2: 98% 93%  98%  Weight:   56.1 kg   Height:        Intake/Output Summary (Last 24 hours) at 04/28/2019 1256 Last data filed at 04/28/2019 0656 Gross per 24 hour  Intake 240 ml  Output --  Net 240 ml   Weight change: 4.39 kg Exam:   General:  Pt is alert, follows commands appropriately, not in acute distress  HEENT: No icterus, No thrush, No neck mass, [place]/AT  Cardiovascular: RRR, S1/S2, no rubs, no gallops  Respiratory: bibasilar crackles. No wheeze  Abdomen: Soft/+BS, non tender, non distended, no guarding  Extremities: No edema, No lymphangitis, No petechiae, No rashes, no synovitis   Data Reviewed: I have personally reviewed following labs and imaging studies Basic Metabolic Panel: Recent Labs  Lab 04/26/19 1947 04/28/19 0445  NA 134* 138  K 4.7 3.5  CL 103 106  CO2 22 24  GLUCOSE 112* 82  BUN 26* 17  CREATININE 1.26* 1.01*  CALCIUM 8.3* 8.5*   Liver Function Tests: Recent Labs  Lab 04/26/19 1947  AST 48*  ALT 82*  ALKPHOS 100  BILITOT 0.2*  PROT 7.0  ALBUMIN 3.4*   No results for input(s): LIPASE, AMYLASE in the last 168 hours. No results for input(s): AMMONIA in the last 168 hours. Coagulation Profile: No results for input(s): INR, PROTIME in the last 168 hours. CBC: Recent Labs  Lab 04/26/19 1947  WBC 10.0  NEUTROABS 7.4  HGB 10.3*  HCT 35.8*  MCV 97.3  PLT 292   Cardiac Enzymes: No results for input(s): CKTOTAL, CKMB, CKMBINDEX, TROPONINI in the last 168 hours. BNP: Invalid input(s):  POCBNP CBG: No results for input(s): GLUCAP in the last 168 hours. HbA1C: No results for input(s): HGBA1C in the last 72 hours. Urine analysis:    Component Value Date/Time   COLORURINE YELLOW 01/27/2019 1533   APPEARANCEUR CLOUDY (A) 01/27/2019 1533   LABSPEC 1.005 01/27/2019 1533   PHURINE 7.0 01/27/2019 1533   GLUCOSEU NEGATIVE 01/27/2019 1533   HGBUR SMALL (A) 01/27/2019 1533   BILIRUBINUR NEGATIVE 01/27/2019 1533   [place] 01/27/2019 1533   PROTEINUR NEGATIVE 01/27/2019 1533   UROBILINOGEN 0.2 08/22/2014 1300   NITRITE NEGATIVE 01/27/2019 1533   LEUKOCYTESUR LARGE (A) 01/27/2019 1533   Sepsis Labs: @LABRCNTIP (procalcitonin:4,lacticidven:4) ) Recent Results (from the past 240 hour(s))  SARS CORONAVIRUS 2 (Marcoantonio Legault 6-24 HRS) Nasopharyngeal Nasopharyngeal Swab     Status: None   Collection Time: 04/20/19  1:23 PM   Specimen: Nasopharyngeal Swab  Result Value Ref Range Status   SARS Coronavirus 2 NEGATIVE NEGATIVE Final    Comment: (NOTE) SARS-CoV-2 target nucleic acids are NOT DETECTED. The SARS-CoV-2 RNA is generally detectable in upper and lower respiratory specimens during the acute phase of infection. Negative results do not preclude SARS-CoV-2 infection, do not rule out co-infections with other pathogens, and should not be used as the sole basis for treatment or other patient  management decisions. Negative results must be combined with clinical observations, patient history, and epidemiological information. The expected result is Negative. Fact Sheet for Patients: SugarRoll.be Fact Sheet for Healthcare Providers: https://www.woods-mathews.com/ This test is not yet approved or cleared by the [place] FDA and  has been authorized for detection and/or diagnosis of SARS-CoV-2 by FDA under an Emergency Use Authorization (EUA). This EUA will remain  in effect (meaning this test can be used) for the duration of  the COVID-19 declaration under Section 56 4(b)(1) of the Act, 21 U.S.C. section 360bbb-3(b)(1), unless the authorization is terminated or revoked sooner. Performed at [facility], [place] [address], [place], [place] [address]   SARS CORONAVIRUS 2 (Anyae Griffith 6-24 HRS) Nasopharyngeal Nasopharyngeal Swab     Status: None   Collection Time: 04/26/19 10:52 PM   Specimen: Nasopharyngeal Swab  Result Value Ref Range Status   SARS Coronavirus 2 NEGATIVE NEGATIVE Final    Comment: (NOTE) SARS-CoV-2 target nucleic acids are NOT DETECTED. The SARS-CoV-2 RNA is generally detectable in upper and lower respiratory specimens during the acute phase of infection. Negative results do not preclude SARS-CoV-2 infection, do not rule out co-infections with other pathogens, and should not be used as the sole basis for treatment or other patient management decisions. Negative results must be combined with clinical observations, patient history, and epidemiological information. The expected result is Negative. Fact Sheet for Patients: SugarRoll.be Fact Sheet for Healthcare Providers: https://www.woods-mathews.com/ This test is not yet approved or cleared by the [place] FDA and  has been authorized for detection and/or diagnosis of SARS-CoV-2 by FDA under an Emergency Use Authorization (EUA). This EUA will remain  in effect (meaning this test can be used) for the duration of the COVID-19 declaration under Section 56 4(b)(1) of the Act, 21 U.S.C. section 360bbb-3(b)(1), unless the authorization is terminated or revoked sooner. Performed at [facility], [place] [address], [place], [place] [address]   MRSA PCR Screening     Status: None   Collection Time: 04/27/19  1:25 AM   Specimen: Nasal Mucosa; Nasopharyngeal  Result Value Ref Range Status   MRSA by PCR NEGATIVE NEGATIVE Final    Comment:        The GeneXpert MRSA Assay (FDA approved for NASAL  specimens only), is one component of a comprehensive MRSA colonization surveillance program. It is not intended to diagnose MRSA infection nor to guide or monitor treatment for MRSA infections. Performed at [facility], [address] [place],  [address]      Scheduled Meds: . diltiazem  90 mg Oral BID  . [START ON 04/29/2019] enoxaparin (LOVENOX) injection  40 mg Subcutaneous Q24H  . feeding supplement  1 Container Oral BID BM  . [START ON 04/29/2019] furosemide  40 mg Intravenous Daily  . levothyroxine  25 mcg Oral QAC breakfast  . metoprolol succinate  50 mg Oral Daily  . multivitamin with minerals  1 tablet Oral Daily  . pregabalin  25 mg Oral BID  . vitamin B-12  500 mcg Oral Daily   Continuous Infusions:  Procedures/Studies: CT Chest W Contrast  Result Date: 04/26/2019 CLINICAL DATA:  Pneumonia, effusions suspected. EXAM: CT CHEST WITH CONTRAST TECHNIQUE: Multidetector CT imaging of the chest was performed during intravenous contrast administration. CONTRAST:  18mL OMNIPAQUE IOHEXOL 300 MG/ML  SOLN COMPARISON:  Chest x-ray today.  Chest CT 04/06/2019 FINDINGS: Cardiovascular: Heart is enlarged. Aorta normal caliber with scattered calcifications. No filling defects in the pulmonary arteries to  suggest pulmonary emboli. Mediastinum/Nodes: No mediastinal, hilar, or axillary adenopathy. Scattered small mediastinal lymph nodes. Lungs/Pleura: Enlarging bilateral pleural effusions since prior chest CT. Right pleural effusion is loculated. Compressive atelectasis in the left lower lobe. Bandlike opacity in the right lower lobe, lingula and right middle lobe, likely areas of atelectasis. There is peribronchial thickening diffusely. Biapical scarring. Upper Abdomen: Imaging into the upper abdomen shows no acute findings. Musculoskeletal: Chest wall soft tissues are unremarkable. No acute bony abnormality. IMPRESSION: Small to moderate bilateral pleural effusions which have increased in  size since prior chest CT. Right pleural effusion is loculated. Areas of atelectasis in the lower lobes bilaterally as well as right middle lobe and lingula. Peribronchial thickening compatible with bronchitis. Cardiomegaly. Electronically Signed   By: Rolm Baptise M.D.   On: 04/26/2019 21:54   DG Chest Port 1 View  Result Date: 04/26/2019 CLINICAL DATA:  86 year old female with confusion, "Hot flashes" which the patient has complained of when in atrial fibrillation previously. EXAM: PORTABLE CHEST 1 VIEW COMPARISON:  Portable chest 04/17/2019 and earlier. FINDINGS: Portable AP upright view at 1859 hours. Stable cardiomegaly and mediastinal contours. Hazy and patchy opacity in the right lung which might appear similar to that on 04/05/2019 portable chest. But increased streaky right infrahilar opacity. Pulmonary vascularity appears decreased from 04/17/2019. No pneumothorax. Small bilateral pleural effusions seen by CTA on 04/06/2019 have been poorly visible radiographically. No confluent left lung opacity. Stable visualized osseous structures. Negative visible bowel gas pattern. IMPRESSION: 1. Nonspecific increased streaky right lung opacity since 04/17/2019. Favor increased atelectasis but acute infection is difficult to exclude. 2. Otherwise stable including cardiomegaly, small pleural effusions which were better demonstrated by CTA earlier this month. Electronically Signed   By: Genevie Ann M.D.   On: 04/26/2019 19:08   DG Chest Port 1 View  Result Date: 04/17/2019 CLINICAL DATA:  Weakness. Recent pneumonia. EXAM: PORTABLE CHEST 1 VIEW COMPARISON:  Radiograph 04/05/2019, CT 04/06/2019. Prior imaging at [facility]. FINDINGS: Cardiomegaly, similar to slightly progressed from prior. Bilateral pleural effusions with blunting of the costophrenic angles. Worsening interstitial opacities. No pneumothorax. Biapical pleuroparenchymal scarring. IMPRESSION: 1. Findings consistent with CHF. Slight worsening in  pulmonary edema and cardiomegaly from prior. 2. Small pleural effusions are similar to prior on AP projection. Electronically Signed   By: Keith Rake M.D.   On: 04/17/2019 19:45    Orson Eva, DO  [facility] Pager [phone]  If 7PM-7AM, please contact night-coverage [email or web address] Password [facility] 04/28/2019, 12:56 PM   LOS: 1 day

## 2019-04-28 NOTE — Progress Notes (Signed)
Pt's daughter states that pt is becoming very anxious and warns that anxiety will increase. Palliative rounded and caused pt increased concern over diagnosis.Xanax was given as PRN order. Pt is now resting and asleep in bed. She shows no visible distress at this time. Call bell is within reach.

## 2019-04-28 NOTE — Consult Note (Addendum)
Cardiology Consult    Patient ID: Renee Blake; [id number]; [birth year]   Admit date: 04/26/2019 Date of Consult: 04/28/2019  Primary Care Provider: [name], MD Primary Cardiologist: Kate Sable, MD   Patient Profile    Renee Blake is a 86 y.o. female with past medical history of persistent atrial fibrillation, chronic diastolic CHF, HTN, history of GIB, chronic anemia, Stage 3 CKD, and history of anorectal cancer who is being seen today for the evaluation of medication management at the request of Dr. Dyann Kief.   History of Present Illness    Renee Blake was admitted to [facility] in 03/2019 for acute hypoxic respiratory failure in the setting of an acute CHF exacerbation. She responded well to IV Lasix with a discharge weight of 112.8 lbs and was placed on Lasix 40mg  daily. She did have persistent atrial fibrillation and was contined on Cardizem CD 180mg  daily, Toprol-XL 50mg  daily and Amiodarone 200mg  daily (for rate-control benefit given soft BP during admission but it was mentioned this could be discontinued at follow-up if she had persistent nausea).  She was evaluated by Dr. Bronson Ing on 04/16/2019 following an admission at [facility] for an acute CHF exacerbation and PNA. Amiodarone was discontinued and Toprol-XL was increased to 50mg  daily (had been reduced during her admission). She presented back to [facility] ED the following day for evaluation of AMS. She was found to have hypotension and generalized weakness felt to be due to malnutrition and failure to thrive. Palliative Care was consulted and the patient wished to try a short course of rehab but did not want aggressive measures. Appears she was continued on Cardizem SR 90mg  BID, Toprol-XL 50mg  daily and Lasix 40mg  in AM/20mg  in PM. Amiodarone was listed as being restarted on her discharge summary.   She presented back to [facility]F on 04/26/2019 for evaluation of worsening confusion and "hot flashes".  Initial labs showed WBC 6.3, Hgb 8.6, platelets 232, Na+ 140, K+ 3.4 and creatinine 1.14. Mg 1.9. BNP 947.  COVID negative. CXR showed increased streaky right lung opacity thought to be most consistent with atelectasis but infection could not be ruled-out. Chest CT showed small to moderate bilateral pleural effusions, areas of atelectasis and peribronchial thickening compatible with bronchitis. EKG shows atrial fibrillation, HR 80 with RBBB and LAFB.   She was admitted for an acute CHF exacerbation and has been started on IV Lasix 20mg . Strict I&O's have not been recorded but she had 4 urine occurrences yesterday. Weight listed as 125 lbs on admission, at 123 lbs today.   In talking with the patient, she reports developing pain along her feet bilaterally with an associated warm sensation on the day of admission. Denies any associated palpitations or chest pain. Says her breathing did become more labored. She feels like her urination had lessened while at SNF and her edema had started to improve.     Past Medical History:  Diagnosis Date  . Acute blood loss anemia   . Atrial fibrillation ([place])    a. diagnosed in 12/2018. Rate-control pursued given not a candidate for anticoagulation  . Chronic diarrhea   . Dilation of biliary tract   . Duodenal stricture   . Esophageal stricture   . Gastric ulcer   . GI bleed   . Hyperlipemia   . Hypertension   . Microscopic colitis   . Nephrolithiasis   . Pancreatic duct dilated   . Schatzki's ring   . Skin cancer   .  Sliding hiatal hernia   . Vertigo     Past Surgical History:  Procedure Laterality Date  . BIOPSY  07/28/2017   Procedure: BIOPSY;  Surgeon: Rogene Houston, MD;  Location: [facility];  Service: Endoscopy;;  rectum  . COLONOSCOPY N/A 07/28/2017   Procedure: COLONOSCOPY;  Surgeon: Rogene Houston, MD;  Location: [facility];  Service: Endoscopy;  Laterality: N/A;  . ESOPHAGOGASTRODUODENOSCOPY N/A 02/27/2013   Procedure:  ESOPHAGOGASTRODUODENOSCOPY (EGD);  Surgeon: Ladene Artist, MD;  Location: [facility] ENDOSCOPY;  Service: Endoscopy;  Laterality: N/A;  . ESOPHAGOGASTRODUODENOSCOPY N/A 03/28/2013   Procedure: ESOPHAGOGASTRODUODENOSCOPY (EGD);  Surgeon: Rogene Houston, MD;  Location: [facility];  Service: Endoscopy;  Laterality: N/A;  . HOT HEMOSTASIS N/A 02/27/2013   Procedure: HOT HEMOSTASIS (ARGON PLASMA COAGULATION/BICAP);  Surgeon: Ladene Artist, MD;  Location: [facility] ENDOSCOPY;  Service: Endoscopy;  Laterality: N/A;  . NOSE SURGERY     for skin cancer  . POLYPECTOMY  07/28/2017   Procedure: POLYPECTOMY;  Surgeon: Rogene Houston, MD;  Location: [facility];  Service: Endoscopy;;  colon      Home Medications:  Prior to Admission medications   Medication Sig Start Date End Date Taking? Authorizing Provider  acetaminophen (TYLENOL) 325 MG tablet Take 650 mg by mouth every 6 (six) hours as needed for mild pain or moderate pain.   Yes [provider]  ALPRAZolam (XANAX) 0.25 MG tablet Take 1 tablet (0.25 mg total) by mouth 2 (two) times daily as needed. for anxiety 04/20/19  Yes Manuella Ghazi, Pratik D, DO  dicyclomine (BENTYL) 10 MG capsule Take 10 mg by mouth every 4 (four) hours as needed for spasms.   Yes [provider]  diltiazem (CARDIZEM SR) 90 MG 12 hr capsule Take 1 capsule (90 mg total) by mouth 2 (two) times daily. 04/03/19  Yes Kroeger, Daleen Snook M., PA-C  furosemide (LASIX) 40 MG tablet Take 20-40 mg by mouth See admin instructions. Taking 40 mg in am, and 20 mg every evening   Yes [provider]  levocetirizine (XYZAL) 5 MG tablet Take 5 mg by mouth every evening.   Yes [provider]  levothyroxine (SYNTHROID) 25 MCG tablet Take 25 mcg by mouth daily before breakfast.   Yes [provider]  meclizine (ANTIVERT) 25 MG tablet Take 25 mg by mouth daily as needed for dizziness.  07/11/17  Yes [provider]  metoprolol succinate (TOPROL-XL) 50 MG 24 hr tablet  Take 1 tablet (50 mg total) by mouth every morning. Take with or immediately following a meal. 04/16/19 07/15/19 Yes Herminio Commons, MD  Multiple Vitamin (MULTIVITAMIN WITH MINERALS) TABS tablet Take 1 tablet by mouth daily. 03/19/19  Yes Tat, Shanon Brow, MD  ondansetron (ZOFRAN) 4 MG tablet Take 4 mg by mouth every 8 (eight) hours as needed for nausea or vomiting.   Yes [provider]  potassium chloride SA (KLOR-CON) 20 MEQ tablet Take 20 mEq by mouth daily.   Yes [provider]  vitamin B-12 (CYANOCOBALAMIN) 500 MCG tablet Take 1 tablet (500 mcg total) by mouth daily. 03/19/19  Yes TatShanon Brow, MD    Inpatient Medications: Scheduled Meds: . diltiazem  90 mg Oral BID  . [START ON 04/29/2019] enoxaparin (LOVENOX) injection  40 mg Subcutaneous Q24H  . feeding supplement  1 Container Oral BID BM  . furosemide  20 mg Intravenous Daily  . levothyroxine  25 mcg Oral QAC breakfast  . metoprolol succinate  50 mg Oral  Daily  . multivitamin with minerals  1 tablet Oral Daily  . pregabalin  25 mg Oral BID  . vitamin B-12  500 mcg Oral Daily   Continuous Infusions:  PRN Meds: acetaminophen, ALPRAZolam, methocarbamol  Allergies:    Allergies  Allergen Reactions  . Clonazepam Other (See Comments)    Pruritis  . Codeine Nausea And Vomiting and Other (See Comments)    Reaction unknown  . Sulfa Antibiotics Other (See Comments)    Reactions unknown    Social History:   Social History   Socioeconomic History  . Marital status: Widowed    Spouse name: Not on file  . Number of children: Not on file  . Years of education: Not on file  . Highest education level: Not on file  Occupational History  . Not on file  Tobacco Use  . Smoking status: Never Smoker  . Smokeless tobacco: Never Used  Substance and Sexual Activity  . Alcohol use: No    Alcohol/week: 0.0 standard drinks  . Drug use: No  . Sexual activity: Not on file  Other Topics Concern  . Not on file  Social  History Narrative  . Not on file   Social Determinants of Health   Financial Resource Strain:   . Difficulty of Paying Living Expenses: Not on file  Food Insecurity:   . Worried About Charity fundraiser in the Last Year: Not on file  . Ran Out of Food in the Last Year: Not on file  Transportation Needs:   . Lack of Transportation (Medical): Not on file  . Lack of Transportation (Non-Medical): Not on file  Physical Activity:   . Days of Exercise per Week: Not on file  . Minutes of Exercise per Session: Not on file  Stress:   . Feeling of Stress : Not on file  Social Connections:   . Frequency of Communication with Friends and Family: Not on file  . Frequency of Social Gatherings with Friends and Family: Not on file  . Attends Religious Services: Not on file  . Active Member of Clubs or Organizations: Not on file  . Attends Archivist Meetings: Not on file  . Marital Status: Not on file  Intimate Partner Violence:   . Fear of Current or Ex-Partner: Not on file  . Emotionally Abused: Not on file  . Physically Abused: Not on file  . Sexually Abused: Not on file     Family History:    Family History  Problem Relation Age of Onset  . Heart attack Father       Review of Systems    General:  No chills, fever, night sweats or weight changes.  Cardiovascular:  No chest pain,  orthopnea, palpitations, paroxysmal nocturnal dyspnea. Positive for dyspnea and lower extremity edema.  Dermatological: No rash, lesions/masses Respiratory: No cough, dyspnea Urologic: No hematuria, dysuria Abdominal:   No nausea, vomiting, diarrhea, bright red blood per rectum, melena, or hematemesis Neurologic:  No visual changes, wkns, changes in mental status. All other systems reviewed and are otherwise negative except as noted above.  Physical Exam/Data    Vitals:   04/27/19 1957 04/27/19 2137 04/28/19 0500 04/28/19 0639  BP:  119/71  127/72  Pulse:  75  95  Resp:  18  18  Temp:   98.2 F (36.8 C)  98.1 F (36.7 C)  TempSrc:  Oral    SpO2: 98% 93%  98%  Weight:   56.1 kg   Height:  Intake/Output Summary (Last 24 hours) at 04/28/2019 0901 Last data filed at 04/28/2019 0656 Gross per 24 hour  Intake 240 ml  Output --  Net 240 ml   Filed Weights   04/27/19 0028 04/27/19 0500 04/28/19 0500  Weight: 56.3 kg 56.7 kg 56.1 kg   Body mass index is 20.58 kg/m.   General: Pleasant, elderly female appearing in NAD Psych: Normal affect. Neuro: Alert and oriented X 3. Moves all extremities spontaneously. HEENT: Normal  Neck: Supple without bruits or JVD. Lungs:  Resp regular and unlabored, decreased breath sounds along bases bilaterally. Heart: Irregularly irregular. No s3, s4. 2/6 holosystolic murmur along Apex. Abdomen: Soft, non-tender, non-distended, BS + x 4.  Extremities: No clubbing, cyanosis or edema. DP/PT/Radials 2+ and equal bilaterally.   EKG:  The EKG was personally reviewed and demonstrates: Atrial fibrillation, HR 80 with RBBB and LAFB.   Telemetry:  Telemetry was personally reviewed and demonstrates: Atrial fibrillation, HR in 80's to 90's.    Labs/Studies     Relevant CV Studies:  Echocardiogram: 01/28/2019 IMPRESSIONS    1. Left ventricular ejection fraction, by visual estimation, is 60 to 65%. The left ventricle has normal function. There is no left ventricular hypertrophy.  2. Elevated left atrial pressure.  3. Left ventricular diastolic parameters are consistent with Grade II diastolic dysfunction (pseudonormalization).  4. Global right ventricle has normal systolic function.The right ventricular size is normal. No increase in right ventricular wall thickness.  5. Left atrial size was severely dilated.  6. Right atrial size was mildly dilated.  7. The mitral valve is abnormal. Moderate mitral valve regurgitation. No evidence of mitral stenosis.  8. The MR vena contracta is 0.6 cm. The MV/AV TVI ratio is 1.1. Findings support  moderate mitral regurgitation.  9. The tricuspid valve is normal in structure. Tricuspid valve regurgitation is mild. 10. The aortic valve is tricuspid. Aortic valve regurgitation is not visualized. No evidence of aortic valve sclerosis or stenosis. 11. The pulmonic valve was not well visualized. Pulmonic valve regurgitation is mild. 12. Moderately elevated pulmonary artery systolic pressure. 13. The inferior vena cava is normal in size with greater than 50% respiratory variability, suggesting right atrial pressure of 3 mmHg.   Laboratory Data:  Chemistry Recent Labs  Lab 04/26/19 1947 04/28/19 0445  NA 134* 138  K 4.7 3.5  CL 103 106  CO2 22 24  GLUCOSE 112* 82  BUN 26* 17  CREATININE 1.26* 1.01*  CALCIUM 8.3* 8.5*  GFRNONAA 38* 50*  GFRAA 44* 58*  ANIONGAP 9 8    Recent Labs  Lab 04/26/19 1947  PROT 7.0  ALBUMIN 3.4*  AST 48*  ALT 82*  ALKPHOS 100  BILITOT 0.2*   Hematology Recent Labs  Lab 04/26/19 1947  WBC 10.0  RBC 3.68*  HGB 10.3*  HCT 35.8*  MCV 97.3  MCH 28.0  MCHC 28.8*  RDW 16.5*  PLT 292   Cardiac EnzymesNo results for input(s): TROPONINI in the last 168 hours. No results for input(s): TROPIPOC in the last 168 hours.  BNP Recent Labs  Lab 04/26/19 1947  BNP 947.0*    DDimer No results for input(s): DDIMER in the last 168 hours.  Radiology/Studies:  CT Chest W Contrast  Result Date: 04/26/2019 CLINICAL DATA:  Pneumonia, effusions suspected. EXAM: CT CHEST WITH CONTRAST TECHNIQUE: Multidetector CT imaging of the chest was performed during intravenous contrast administration. CONTRAST:  75mL OMNIPAQUE IOHEXOL 300 MG/ML  SOLN COMPARISON:  Chest x-ray today.  Chest CT  04/06/2019 FINDINGS: Cardiovascular: Heart is enlarged. Aorta normal caliber with scattered calcifications. No filling defects in the pulmonary arteries to suggest pulmonary emboli. Mediastinum/Nodes: No mediastinal, hilar, or axillary adenopathy. Scattered small mediastinal lymph  nodes. Lungs/Pleura: Enlarging bilateral pleural effusions since prior chest CT. Right pleural effusion is loculated. Compressive atelectasis in the left lower lobe. Bandlike opacity in the right lower lobe, lingula and right middle lobe, likely areas of atelectasis. There is peribronchial thickening diffusely. Biapical scarring. Upper Abdomen: Imaging into the upper abdomen shows no acute findings. Musculoskeletal: Chest wall soft tissues are unremarkable. No acute bony abnormality. IMPRESSION: Small to moderate bilateral pleural effusions which have increased in size since prior chest CT. Right pleural effusion is loculated. Areas of atelectasis in the lower lobes bilaterally as well as right middle lobe and lingula. Peribronchial thickening compatible with bronchitis. Cardiomegaly. Electronically Signed   By: Rolm Baptise M.D.   On: 04/26/2019 21:54   DG Chest Port 1 View  Result Date: 04/26/2019 CLINICAL DATA:  86 year old female with confusion, "Hot flashes" which the patient has complained of when in atrial fibrillation previously. EXAM: PORTABLE CHEST 1 VIEW COMPARISON:  Portable chest 04/17/2019 and earlier. FINDINGS: Portable AP upright view at 1859 hours. Stable cardiomegaly and mediastinal contours. Hazy and patchy opacity in the right lung which might appear similar to that on 04/05/2019 portable chest. But increased streaky right infrahilar opacity. Pulmonary vascularity appears decreased from 04/17/2019. No pneumothorax. Small bilateral pleural effusions seen by CTA on 04/06/2019 have been poorly visible radiographically. No confluent left lung opacity. Stable visualized osseous structures. Negative visible bowel gas pattern. IMPRESSION: 1. Nonspecific increased streaky right lung opacity since 04/17/2019. Favor increased atelectasis but acute infection is difficult to exclude. 2. Otherwise stable including cardiomegaly, small pleural effusions which were better demonstrated by CTA earlier this  month. Electronically Signed   By: Genevie Ann M.D.   On: 04/26/2019 19:08     Assessment & Plan    1. Acute on Chronic Diastolic CHF Exacerbation - presented for evaluation of bilateral leg pain and "hot flashes" which she says have been representative of her atrial fibrillation in the past. Rates controlled on admission but found to have an acute CHF exacerbation with BNP elevated to 947 and Chest CT showed small to moderate bilateral pleural effusions, areas of atelectasis and peribronchial thickening compatible with bronchitis. - she is currently receiving IV Lasix 20mg  and weight was listed as 125 lbs on admission, at 123 lbs today. Previous discharge weight of 112 lbs in 03/2019 but she does not appear to have 10+ lbs of fluid by examination. Will increase IV Lasix to 40mg  daily. Continue to follow I&O's along with daily weights. She reports minimal urination with PO Lasix prior to admission so may need to consider Torsemide at discharge. Would obtain repeat CXR following additional diuresis.   2. Persistent Atrial Fibrillation - she denies any recent palpitations and HR has been well-controlled in the 80's to 90's by review of telemetry. Continue Cardizem SR 90mg  BID and Toprol-XL 50mg  daily. Amiodarone had previously been discontinued by Dr. Bronson Ing but was restarted last admission by the admitting team. Again discontinued as this could have been playing a role in her nausea.  - she has not been on anticoagulation given her chronic anemia and history of GIB.   3. Chronic Anemia - Hgb stable at 10.3 on admission. She denies any evidence of active bleeding.   4. Mitral Regurgitation - moderate by echocardiogram in 12/2018. Continue to follow  as an outpatient.   5. Failure to Thrive - Palliative Care has been consulted by the admitting team to continue goals of care discussions.    For questions or updates, please contact [place] Please consult [email or web address] for contact info under  Cardiology/STEMI.  Signed, Erma Heritage, PA-C 04/28/2019, 9:01 AM Pager: [phone]   Attending note:  Records reviewed and case discussed with Renee Blake, I agree with her above findings.  Renee Blake is a patient of Dr. Bronson Ing, currently admitted with evidence of acute on chronic diastolic heart failure and persistent atrial fibrillation that has been managed with heart rate control strategy, although not anticoagulation in the face of chronic anemia and history of GI bleeding.  Recent heart rate in the 90s in atrial fibrillation by telemetry which I personally reviewed, systolic blood pressure [id number].  Medications include Cardizem 90 mg twice daily and Toprol-XL 50 mg daily.  Amiodarone has been used in the past for heart rate control, however not continued with concern for possible association with nausea.  Chest CT shows small to moderate bilateral pleural effusions and she has been placed on IV Lasix.  Weight is over previous baseline.  Lab work shows potassium 3.5, BUN 17, creatinine 1.01, BNP 947, hemoglobin 10.3, platelets 292, SARS coronavirus 2 test negative.  I personally reviewed her ECG from 04/26/2019 which showed rate controlled atrial fibrillation with right bundle branch block and left anterior fascicular block.  Echocardiogram from October 2020 revealed LVEF 60 to 65%, normal RV contraction, severe left atrial enlargement, moderate mitral regurgitation.  From a cardiac perspective, would continue efforts at heart rate control, presently on calcium channel blocker and beta-blocker therapy, this can be adjusted as needed.  As discussed previously, anticoagulation has not been pursued with history of GI bleeding and chronic anemia.  Attempts at cardioversion are not possible in the absence of anticoagulation, and the fact that she has severe left atrial enlargement at baseline would suggest that the likelihood of maintaining sinus rhythm is fairly low anyway.  Agree  with IV diuresis, Lasix being increased to 40 mg IV daily, track intake and output along with renal function.  Dr. Bronson Ing will be rounding on Thursday and Friday of this week and can continue to follow.  Palliative care team has also been consulted by primary team to discuss goals of care.  Satira Sark, M.D., F.A.C.C.

## 2019-04-28 NOTE — Telephone Encounter (Signed)
Daughter called wanting information about patient's condition. Redirected daughter to speak with nursing staff at [facility]. Verbalized understanding.

## 2019-04-29 DIAGNOSIS — J9601 Acute respiratory failure with hypoxia: Secondary | ICD-10-CM

## 2019-04-29 DIAGNOSIS — I4821 Permanent atrial fibrillation: Secondary | ICD-10-CM

## 2019-04-29 DIAGNOSIS — J90 Pleural effusion, not elsewhere classified: Secondary | ICD-10-CM

## 2019-04-29 DIAGNOSIS — D649 Anemia, unspecified: Secondary | ICD-10-CM

## 2019-04-29 DIAGNOSIS — N1832 Chronic kidney disease, stage 3b: Secondary | ICD-10-CM

## 2019-04-29 DIAGNOSIS — I340 Nonrheumatic mitral (valve) insufficiency: Secondary | ICD-10-CM

## 2019-04-29 LAB — BASIC METABOLIC PANEL
Anion gap: 8 (ref 5–15)
BUN: 17 mg/dL (ref 8–23)
CO2: 25 mmol/L (ref 22–32)
Calcium: 8.4 mg/dL — ABNORMAL LOW (ref 8.9–10.3)
Chloride: 105 mmol/L (ref 98–111)
Creatinine, Ser: 1.09 mg/dL — ABNORMAL HIGH (ref 0.44–1.00)
GFR calc Af Amer: 52 mL/min — ABNORMAL LOW (ref >60)
GFR calc non Af Amer: 45 mL/min — ABNORMAL LOW (ref >60)
Glucose, Bld: 79 mg/dL (ref 70–99)
Potassium: 3.3 mmol/L — ABNORMAL LOW (ref 3.5–5.1)
Sodium: 138 mmol/L (ref 135–145)

## 2019-04-29 MED ORDER — POTASSIUM CHLORIDE CRYS ER 20 MEQ PO TBCR
20.0000 meq | EXTENDED_RELEASE_TABLET | Freq: Once | ORAL | Status: AC
  Administered 2019-04-29: 20 meq via ORAL
  Filled 2019-04-29: qty 1

## 2019-04-29 NOTE — Progress Notes (Signed)
PROGRESS NOTE  Renee Blake [id number] DOB: Feb 15, 1929 DOA: 04/26/2019 PCP: [name], MD  Brief History:  86 year old female with a history of permanent atrial fibrillation not on anticoagulation secondary to GI bleed, diastolic CHF, CKD stage III, endometrial carcinoma, and squamous cell carcinoma of the anal canal presenting from [facility] with  shortness of breath, nausea, leg edema, and hot flashes.  She was recently admitted from 03/13/19 to 03/18/19 with acute on chronic dCHF.  Her d/c weight was 112.8 lbs, and she was d/ced with lasix 40 mg daily. Initial labs showed WBC 6.3, Hgb 8.6, platelets 232, Na+ 140, K+ 3.4 and creatinine 1.14. Mg 1.9. BNP 947. COVID negative. CXR showed increased streaky right lung opacity thought to be most consistent with atelectasis but infection could not be ruled-out. Chest CT showed small to moderate bilateral pleural effusions, areas of atelectasis and peribronchial thickening compatible with bronchitis. EKG shows atrial fibrillation, HR 80 with RBBB and LAFB.   She was admitted for an acute CHF exacerbation and has been started on IV Lasix 20mg .Strict I&O's have not been recorded but she had 4 urine occurrences yesterday. Weight listed as 125 lbs on admission, at 123 lbs today.   Assessment/Plan: Acute respiratory failure with hypoxia -Secondary to mild exacerbation of diastolic CHF -Patient was successfully weaned off 3 L of oxygen to room air -now weaned to RA  Acute on chronic diastolic CHF -appreciate cardiology follow up -continue IV lasix and metoprolol succinate -IV lasix increased to 40 mg IV--continuing -01/19/19 Echo EF 60-65%, G2DD, moderate elevated PASP -03/17/20 discharge weight 112.8 lbs  Permanent Atrial Fibrillation -continue diltiazem SR and metoprolol succinate -amiodarone stopped during December 2020 admission -not on [facility] due to GIB,duodenal ulcers, anemia -rate controlled  Nausea/hot  flash/FTT -Post ingestion of morning meds developing nausea, hot flashes, but no vomiting -evaluate medication and dividedthe doses -As needed antiemetics -No signs of infection -History of GI bleed,>> H&H remained stable -repeat Hemoccult--neg -CEA normal at 4.4 -03/18/19 TSH 13.447 -03/18/19 Free T4--1.07 -check B12--150 on 03/18/19 -folate 13.4 -tolerating diet presently  CKD stage 3b -baseline creatinine 1.2-1.4 -am BMP  History ofSCCanorectal cancer/endometrial carcinoma/ -Hemoccult--neg -Stable -CEA normal at 4.4 -patient followed by rad oncology, GYN and medical oncology team from [facility] by [place]. No recent follow-up -12/16 CT abd/pelvis--no acute findings, no mets, nonobstructine R-renal calculi -Last Colonoscopy:April 2019-new findings include nodular hard mass anterior anal orifice, DRE revealed 5 cm hard rectal masscircumferential anterior bowel wall, 6 mm polyp distal sigmoid. Path w/rectum distal lesion biopsy-at least high-grade squamous intraepithelial lesion suspicious for invasion, sigmoid polyp inflammatory  Goals of Care -Advance care planning, including the explanation and discussion of advance directives was carried out with the patient and family.  Code status including explanations of "Full Code" and "DNR" and alternatives were discussed in detail.  Discussion of end-of-life issues including but not limited palliative care, hospice care and the concept of hospice, other end-of-life care options, power of attorney for health care decisions, living wills, and physician orders for life-sustaining treatment were also discussed with the patient and family.  Total face to face time 16 minutes. -consult palliative med          Disposition Plan:   Home with cleared by cardiology--refuses SNF Family Communication:   Daughter updated at bedside 1/27  Consultants:  cardiology  Code Status:  DNR  DVT Prophylaxis:  [place]  Lovenox   Procedures: As Listed in Progress  Note Above  Antibiotics: None       Subjective: Patient denies fevers, chills, headache, chest pain, dyspnea, nausea, vomiting, diarrhea, abdominal pain, dysuria, hematuria, hematochezia, and melena.   Objective: Vitals:   04/29/19 0408 04/29/19 1001 04/29/19 1002 04/29/19 1445  BP: 110/67 126/67  120/73  Pulse: 79  90 87  Resp: 17   18  Temp: 97.6 F (36.4 C)   (!) 97.5 F (36.4 C)  TempSrc: Oral   Oral  SpO2: 94%   94%  Weight:      Height:        Intake/Output Summary (Last 24 hours) at 04/29/2019 1801 Last data filed at 04/29/2019 0900 Gross per 24 hour  Intake 480 ml  Output 350 ml  Net 130 ml   Weight change: -2 kg Exam:   General:  Pt is alert, follows commands appropriately, not in acute distress  HEENT: No icterus, No thrush, No neck mass, /AT  Cardiovascular: RRR, S1/S2, no rubs, no gallops  Respiratory: CTA bilaterally, no wheezing, no crackles, no rhonchi  Abdomen: Soft/+BS, non tender, non distended, no guarding  Extremities: No edema, No lymphangitis, No petechiae, No rashes, no synovitis   Data Reviewed: I have personally reviewed following labs and imaging studies Basic Metabolic Panel: Recent Labs  Lab 04/26/19 1947 04/28/19 0445 04/29/19 0440  NA 134* 138 138  K 4.7 3.5 3.3*  CL 103 106 105  CO2 22 24 25   GLUCOSE 112* 82 79  BUN 26* 17 17  CREATININE 1.26* 1.01* 1.09*  CALCIUM 8.3* 8.5* 8.4*   Liver Function Tests: Recent Labs  Lab 04/26/19 1947  AST 48*  ALT 82*  ALKPHOS 100  BILITOT 0.2*  PROT 7.0  ALBUMIN 3.4*   No results for input(s): LIPASE, AMYLASE in the last 168 hours. No results for input(s): AMMONIA in the last 168 hours. Coagulation Profile: No results for input(s): INR, PROTIME in the last 168 hours. CBC: Recent Labs  Lab 04/26/19 1947  WBC 10.0  NEUTROABS 7.4  HGB 10.3*  HCT 35.8*  MCV 97.3  PLT 292   Cardiac Enzymes: No results for  input(s): CKTOTAL, CKMB, CKMBINDEX, TROPONINI in the last 168 hours. BNP: Invalid input(s): POCBNP CBG: No results for input(s): GLUCAP in the last 168 hours. HbA1C: No results for input(s): HGBA1C in the last 72 hours. Urine analysis:    Component Value Date/Time   COLORURINE YELLOW 01/27/2019 1533   APPEARANCEUR CLOUDY (A) 01/27/2019 1533   LABSPEC 1.005 01/27/2019 1533   PHURINE 7.0 01/27/2019 1533   GLUCOSEU NEGATIVE 01/27/2019 1533   HGBUR SMALL (A) 01/27/2019 1533   BILIRUBINUR NEGATIVE 01/27/2019 1533   [place] 01/27/2019 1533   PROTEINUR NEGATIVE 01/27/2019 1533   UROBILINOGEN 0.2 08/22/2014 1300   NITRITE NEGATIVE 01/27/2019 1533   LEUKOCYTESUR LARGE (A) 01/27/2019 1533   Sepsis Labs: @LABRCNTIP (procalcitonin:4,lacticidven:4) ) Recent Results (from the past 240 hour(s))  SARS CORONAVIRUS 2 (Arabelle Bollig 6-24 HRS) Nasopharyngeal Nasopharyngeal Swab     Status: None   Collection Time: 04/20/19  1:23 PM   Specimen: Nasopharyngeal Swab  Result Value Ref Range Status   SARS Coronavirus 2 NEGATIVE NEGATIVE Final    Comment: (NOTE) SARS-CoV-2 target nucleic acids are NOT DETECTED. The SARS-CoV-2 RNA is generally detectable in upper and lower respiratory specimens during the acute phase of infection. Negative results do not preclude SARS-CoV-2 infection, do not rule out co-infections with other pathogens, and should not be used as the sole basis for treatment or other patient  management decisions. Negative results must be combined with clinical observations, patient history, and epidemiological information. The expected result is Negative. Fact Sheet for Patients: SugarRoll.be Fact Sheet for Healthcare Providers: https://www.woods-mathews.com/ This test is not yet approved or cleared by the [place] FDA and  has been authorized for detection and/or diagnosis of SARS-CoV-2 by FDA under an Emergency Use Authorization (EUA).  This EUA will remain  in effect (meaning this test can be used) for the duration of the COVID-19 declaration under Section 56 4(b)(1) of the Act, 21 U.S.C. section 360bbb-3(b)(1), unless the authorization is terminated or revoked sooner. Performed at [facility], [place] [address], [place], [place] [address]   SARS CORONAVIRUS 2 (Natalynn Pedone 6-24 HRS) Nasopharyngeal Nasopharyngeal Swab     Status: None   Collection Time: 04/26/19 10:52 PM   Specimen: Nasopharyngeal Swab  Result Value Ref Range Status   SARS Coronavirus 2 NEGATIVE NEGATIVE Final    Comment: (NOTE) SARS-CoV-2 target nucleic acids are NOT DETECTED. The SARS-CoV-2 RNA is generally detectable in upper and lower respiratory specimens during the acute phase of infection. Negative results do not preclude SARS-CoV-2 infection, do not rule out co-infections with other pathogens, and should not be used as the sole basis for treatment or other patient management decisions. Negative results must be combined with clinical observations, patient history, and epidemiological information. The expected result is Negative. Fact Sheet for Patients: SugarRoll.be Fact Sheet for Healthcare Providers: https://www.woods-mathews.com/ This test is not yet approved or cleared by the [place] FDA and  has been authorized for detection and/or diagnosis of SARS-CoV-2 by FDA under an Emergency Use Authorization (EUA). This EUA will remain  in effect (meaning this test can be used) for the duration of the COVID-19 declaration under Section 56 4(b)(1) of the Act, 21 U.S.C. section 360bbb-3(b)(1), unless the authorization is terminated or revoked sooner. Performed at [facility], [place] [address], [place], [place] [address]   MRSA PCR Screening     Status: None   Collection Time: 04/27/19  1:25 AM   Specimen: Nasal Mucosa; Nasopharyngeal  Result Value Ref Range Status   MRSA by PCR NEGATIVE NEGATIVE  Final    Comment:        The GeneXpert MRSA Assay (FDA approved for NASAL specimens only), is one component of a comprehensive MRSA colonization surveillance program. It is not intended to diagnose MRSA infection nor to guide or monitor treatment for MRSA infections. Performed at [facility], [address], [place], [place] [address]      Scheduled Meds:  diltiazem  90 mg Oral BID   enoxaparin (LOVENOX) injection  40 mg Subcutaneous Q24H   feeding supplement  1 Container Oral BID BM   furosemide  40 mg Intravenous Daily   levothyroxine  25 mcg Oral QAC breakfast   metoprolol succinate  50 mg Oral Daily   multivitamin with minerals  1 tablet Oral Daily   pregabalin  25 mg Oral BID   vitamin B-12  500 mcg Oral Daily   Continuous Infusions:  Procedures/Studies: CT Chest W Contrast  Result Date: 04/26/2019 CLINICAL DATA:  Pneumonia, effusions suspected. EXAM: CT CHEST WITH CONTRAST TECHNIQUE: Multidetector CT imaging of the chest was performed during intravenous contrast administration. CONTRAST:  20mL OMNIPAQUE IOHEXOL 300 MG/ML  SOLN COMPARISON:  Chest x-ray today.  Chest CT 04/06/2019 FINDINGS: Cardiovascular: Heart is enlarged. Aorta normal caliber with scattered calcifications. No filling defects in the pulmonary arteries to suggest pulmonary emboli. Mediastinum/Nodes: No mediastinal,  hilar, or axillary adenopathy. Scattered small mediastinal lymph nodes. Lungs/Pleura: Enlarging bilateral pleural effusions since prior chest CT. Right pleural effusion is loculated. Compressive atelectasis in the left lower lobe. Bandlike opacity in the right lower lobe, lingula and right middle lobe, likely areas of atelectasis. There is peribronchial thickening diffusely. Biapical scarring. Upper Abdomen: Imaging into the upper abdomen shows no acute findings. Musculoskeletal: Chest wall soft tissues are unremarkable. No acute bony abnormality. IMPRESSION: Small to moderate bilateral pleural  effusions which have increased in size since prior chest CT. Right pleural effusion is loculated. Areas of atelectasis in the lower lobes bilaterally as well as right middle lobe and lingula. Peribronchial thickening compatible with bronchitis. Cardiomegaly. Electronically Signed   By: Rolm Baptise M.D.   On: 04/26/2019 21:54   DG Chest Port 1 View  Result Date: 04/26/2019 CLINICAL DATA:  86 year old female with confusion, "Hot flashes" which the patient has complained of when in atrial fibrillation previously. EXAM: PORTABLE CHEST 1 VIEW COMPARISON:  Portable chest 04/17/2019 and earlier. FINDINGS: Portable AP upright view at 1859 hours. Stable cardiomegaly and mediastinal contours. Hazy and patchy opacity in the right lung which might appear similar to that on 04/05/2019 portable chest. But increased streaky right infrahilar opacity. Pulmonary vascularity appears decreased from 04/17/2019. No pneumothorax. Small bilateral pleural effusions seen by CTA on 04/06/2019 have been poorly visible radiographically. No confluent left lung opacity. Stable visualized osseous structures. Negative visible bowel gas pattern. IMPRESSION: 1. Nonspecific increased streaky right lung opacity since 04/17/2019. Favor increased atelectasis but acute infection is difficult to exclude. 2. Otherwise stable including cardiomegaly, small pleural effusions which were better demonstrated by CTA earlier this month. Electronically Signed   By: Genevie Ann M.D.   On: 04/26/2019 19:08   DG Chest Port 1 View  Result Date: 04/17/2019 CLINICAL DATA:  Weakness. Recent pneumonia. EXAM: PORTABLE CHEST 1 VIEW COMPARISON:  Radiograph 04/05/2019, CT 04/06/2019. Prior imaging at [facility]. FINDINGS: Cardiomegaly, similar to slightly progressed from prior. Bilateral pleural effusions with blunting of the costophrenic angles. Worsening interstitial opacities. No pneumothorax. Biapical pleuroparenchymal scarring. IMPRESSION: 1. Findings consistent  with CHF. Slight worsening in pulmonary edema and cardiomegaly from prior. 2. Small pleural effusions are similar to prior on AP projection. Electronically Signed   By: Keith Rake M.D.   On: 04/17/2019 19:45    Orson Eva, DO  [facility] Pager [phone]  If 7PM-7AM, please contact night-coverage [email or web address] Password [email or web address] 04/29/2019, 6:01 PM   LOS: 2 days

## 2019-04-29 NOTE — NC FL2 (Signed)
[place] LEVEL OF CARE SCREENING TOOL     IDENTIFICATION  Patient Name: Renee Blake Birthdate: [birth year] Sex: female Admission Date (Current Location): 04/26/2019  [facility] and [place] Number:  [employer] and Address:  [place] [address], [place]      Provider Number: [id number]  Attending Physician Name and Address:  Orson Eva, MD  Relative Name and Phone Number:       Current Level of Care: Hospital Recommended Level of Care: [place] Prior Approval Number:    Date Approved/Denied:   PASRR Number: [id number]  Discharge Plan: SNF    Current Diagnoses: Patient Active Problem List   Diagnosis Date Noted  . Palliative care by specialist   . Encounter for hospice care discussion   . Protein-calorie malnutrition, severe 04/27/2019  . Acute on chronic diastolic (congestive) heart failure ([place]) 04/27/2019  . Normocytic anemia 04/26/2019  . Goals of care, counseling/discussion   . Palliative care encounter   . Hypotension 04/17/2019  . Debility 03/16/2019  . Shortness of breath 03/16/2019  . Cachexia ([place]) 03/16/2019  . Permanent atrial fibrillation ([place])   . Hypoxia 03/15/2019  . CHF exacerbation ([place]) 03/14/2019  . Bilateral pleural effusion 03/04/2019  . Chronic atrial fibrillation ([place]) 03/04/2019  . DNR (do not resuscitate) 03/04/2019  . Acute on chronic diastolic congestive heart failure ([place]) 03/02/2019  . Chronic diastolic heart failure ([place]) 03/02/2019  . Chronic renal failure, stage 3b 03/02/2019  . Acute respiratory failure with hypoxia ([place]) 03/02/2019  . Acute exacerbation of CHF (congestive heart failure) ([place]) 01/27/2019  . Atrial fibrillation with RVR ([place]) 01/27/2019  . Iron deficiency anemia due to chronic blood loss---H/o Gi Bleed 01/27/2019  . Vaginal mass 07/29/2017  . Heme positive stool 07/25/2017  . Cellulitis 12/10/2016  . Facial cellulitis   . Erroneous  encounter - disregard 03/17/2015  . AKI (acute kidney injury) ([place])   . Metabolic acidosis 0000000  . Acute renal failure syndrome ([place])   . Renal failure 08/22/2014  . Hyperkalemia 08/22/2014  . UTI (urinary tract infection) 08/22/2014  . Acute renal failure ([place]) 08/22/2014  . Dyspnea 03/15/2014  . Type 2 diabetes mellitus ([place]) 06/08/2013  . Acute diastolic congestive heart failure ([place]) 06/07/2013  . Syncope 03/26/2013  . Rectal bleeding 03/26/2013  . Dehydration 03/26/2013  . Generalized weakness 03/26/2013  . Malnutrition of moderate degree ([place]) 03/01/2013  . Diarrhea 02/28/2013  . Loss of weight 02/28/2013  . H/o Prior duodenal ulcer with hemorrhage 02/27/2013  . H/o Prior Lower GI bleed (anorectal Ca) 02/26/2013  . Acute blood loss anemia 02/26/2013  . UTI (lower urinary tract infection) 02/26/2013  . Hypertension 02/26/2013    Orientation RESPIRATION BLADDER Height & Weight     Self, Time, Situation  Normal Incontinent Weight: 54.1 kg Height:  5\' 5"  (165.1 cm)  BEHAVIORAL SYMPTOMS/MOOD NEUROLOGICAL BOWEL NUTRITION STATUS      Continent Diet(see DC summary)  AMBULATORY STATUS COMMUNICATION OF NEEDS Skin   Extensive Assist Verbally Normal                       Personal Care Assistance Level of Assistance  Bathing, Feeding, Dressing Bathing Assistance: Maximum assistance Feeding assistance: Independent Dressing Assistance: Limited assistance     Functional Limitations Info  Sight, Hearing, Speech Sight Info: Impaired Hearing Info: Adequate Speech Info: Adequate    SPECIAL CARE FACTORS FREQUENCY  PT (By licensed PT)  PT Frequency: 5x/week              Contractures Contractures Info: Not present    Additional Factors Info  Code Status Code Status Info: DNR Allergies Info: CLONAZEPAM, CODEINE, SULFA           Current Medications (04/29/2019):  This is the current hospital active medication list Current Facility-Administered  Medications  Medication Dose Route Frequency Provider Last Rate Last Admin  . acetaminophen (TYLENOL) tablet 650 mg  650 mg Oral Q6H PRN Barton Dubois, MD      . ALPRAZolam Duanne Moron) tablet 0.25 mg  0.25 mg Oral BID PRN Reubin Milan, MD   0.25 mg at 04/29/19 1043  . diltiazem (CARDIZEM SR) 12 hr capsule 90 mg  90 mg Oral BID Barton Dubois, MD   90 mg at 04/29/19 1001  . enoxaparin (LOVENOX) injection 40 mg  40 mg Subcutaneous Q24H Tat, Shanon Brow, MD   40 mg at 04/29/19 0739  . feeding supplement (BOOST / RESOURCE BREEZE) liquid 1 Container  1 Container Oral BID BM Barton Dubois, MD   1 Container at 04/28/19 1335  . furosemide (LASIX) injection 40 mg  40 mg Intravenous Daily Bernerd Pho M, PA-C   40 mg at 04/29/19 1001  . levothyroxine (SYNTHROID) tablet 25 mcg  25 mcg Oral QAC breakfast Barton Dubois, MD   25 mcg at 04/29/19 0408  . methocarbamol (ROBAXIN) tablet 500 mg  500 mg Oral Q8H PRN Barton Dubois, MD   500 mg at 04/27/19 1804  . metoprolol succinate (TOPROL-XL) 24 hr tablet 50 mg  50 mg Oral Daily Barton Dubois, MD   50 mg at 04/29/19 1002  . multivitamin with minerals tablet 1 tablet  1 tablet Oral Daily Barton Dubois, MD   1 tablet at 04/29/19 1002  . pregabalin (LYRICA) capsule 25 mg  25 mg Oral BID Barton Dubois, MD   25 mg at 04/29/19 1003  . vitamin B-12 (CYANOCOBALAMIN) tablet 500 mcg  500 mcg Oral Daily Barton Dubois, MD   500 mcg at 04/29/19 1003     Discharge Medications: Please see discharge summary for a list of discharge medications.  Relevant Imaging Results:  Relevant Lab Results:   Additional Information SSN [id number]  Aristotle Lieb, Chauncey Reading, RN

## 2019-04-29 NOTE — Progress Notes (Signed)
Palliative: [name] is resting quietly in bed.  She is sleeping, but wakes easily when I call her name.  She is alert and oriented, able to make her basic needs known.  There is no family at bedside at this time.  We talked briefly about her heart failure exacerbation.  She tells me that she feels that she still has more fluid that needs to come off.    Conference with attending related to patient condition, needs.  Plan:   Continue to treat the treatable but no CPR, no intubation.  Continuing education on outpatient management of heart failure would be beneficial, but not likely to reduce hospitalizations.  [name] would benefit from at home treat the treatable hospice care, but she is not agreeable to hospice care at this time  15 minutes [name], NP Palliative Medicine Team Team Phone # [phone] Greater than 50% of this time was spent counseling and coordinating care related to the above assessment and plan.

## 2019-04-29 NOTE — TOC Progression Note (Signed)
Transition of Care [facility]) - Progression Note    Patient Details  Name: MERYLE KAHLEY MRN: [id number] Date of Birth: [birth year]  [facility] CM/SW Contact  Kahlani Graber, Chauncey Reading, RN Phone Number: 04/29/2019, 3:26 PM  Clinical Narrative:   Family elects to take patient home. Agreeable to home health. No preference on providers. Patient will be staying with Jola Schmidt- [id number], [name] unsure of address.     Expected Discharge Plan: Home w Hospice Care Barriers to Discharge: Continued Medical Work up  Expected Discharge Plan and Services Expected Discharge Plan: [place] In-house Referral: Clinical Social Work Discharge Planning Services: CM Consult Post Acute Care Choice: Hospice Living arrangements for the past 2 months: Single Family Home                                       Social Determinants of Health (SDOH) Interventions    Readmission Risk Interventions Readmission Risk Prevention Plan 04/20/2019 03/15/2019 03/02/2019  Transportation Screening Complete Complete Complete  PCP or Specialist Appt within 5-7 Days - - -  PCP or Specialist Appt within 3-5 Days - - Complete  Home Care Screening - - -  Medication Review (RN CM) - - -  [place] or [place] - Complete Complete  Social Work Consult for [place] Planning/Counseling - Complete Complete  Palliative Care Screening - Not Applicable Complete  Medication Review Press photographer) Complete Complete Complete  PCP or Specialist appointment within 3-5 days of discharge Not Complete - -  [place] or Home Care Consult Complete - -  SW Recovery Care/Counseling Consult Complete - -  Palliative Care Screening Not Complete - -  Skilled Nursing Facility Complete - -  Some recent data might be hidden

## 2019-04-29 NOTE — Evaluation (Signed)
Physical Therapy Evaluation Patient Details Name: Renee Blake MRN: [id number] DOB: 04/26/1928 Today's Date: 04/29/2019   History of Present Illness  Renee Blake is a 86 y.o. female with medical history significant of history of anemia, chronic atrial fibrillation, chronic diarrhea, dilation of biliary tract, esophageal stricture, duodenal stricture, sliding hiatal hernia, gastric ulcer, history of GI bleed, hyperlipidemia, hypertension, microscopic colitis, nephrolithiasis, pancreatic duct dilation, Schatzki ring, skin cancer, vertigo who was recently admitted from January 16 to January 16 due to diarrhea, malnutrition, weakness and diastolic CHF exacerbation.  She is [facility] due to confusion and hot flashes.  Her daughter stated to Dr. Rogene Houston that she usually has these hot flashes episodes when [name] starts acting up.  The patient is unable to elaborate much but has been complaining of lower extremity discomfort.  She has been having increased oxygen requirement from her baseline 2 LPM via [place].    Clinical Impression  Patient demonstrates slow slightly labored movement for sitting up at bedside, has to lean on nearby objects for support when not using AD, ambulated in room using RW without loss of balance, limited secondary to fatigue and tolerated sitting up in chair after therapy - RN notified.  Patient will benefit from continued physical therapy in hospital and recommended venue below to increase strength, balance, endurance for safe ADLs and gait.    Follow Up Recommendations SNF;Supervision for mobility/OOB;Supervision - Intermittent    Equipment Recommendations  None recommended by PT    Recommendations for Other Services       Precautions / Restrictions Precautions Precautions: Fall Restrictions Weight Bearing Restrictions: No      Mobility  Bed Mobility Overal bed mobility: Needs Assistance Bed Mobility: Supine  to Sit     Supine to sit: Supervision     General bed mobility comments: increased time, slightly labored movemnt  Transfers Overall transfer level: Needs assistance Equipment used: Rolling walker (2 wheeled) Transfers: Sit to/from [facility] Sit to Stand: Min guard Stand pivot transfers: Min guard       General transfer comment: increased time, labored movement  Ambulation/Gait Ambulation/Gait assistance: Min guard Gait Distance (Feet): 60 Feet Assistive device: Rolling walker (2 wheeled) Gait Pattern/deviations: Decreased step length - right;Decreased step length - left;Decreased stride length Gait velocity: decreased   General Gait Details: slightly labored cadence without loss of balance, limited secondary to fatigue  Stairs            Wheelchair Mobility    Modified Rankin (Stroke Patients Only)       Balance Overall balance assessment: Needs assistance Sitting-balance support: Feet supported;No upper extremity supported Sitting balance-Leahy Scale: Good Sitting balance - Comments: seated EOB   Standing balance support: During functional activity;No upper extremity supported Standing balance-Leahy Scale: Poor Standing balance comment: fair/poor without AD, fair/good using RW                             Pertinent Vitals/Pain Pain Assessment: No/denies pain    Home Living Family/patient expects to be discharged to:: Private residence Living Arrangements: Children Available Help at Discharge: Family;Friend(s);Available 24 hours/day Type of Home: House Home Access: Stairs to enter Entrance Stairs-Rails: Can reach both;Right;Left Entrance Stairs-Number of Steps: 2 Home Layout: One level Home Equipment: Walker - 2 wheels;Cane - single point;Bedside commode;Shower seat      Prior Function Level of Independence: Needs assistance   Gait /  Transfers Assistance Needed: family assists with transfers and ambualtion, sometimes  uses RW  ADL's / Homemaking Assistance Needed: assisted by family  Comments: Household ambulator     Journalist, newspaper   Dominant Hand: Right    Extremity/Trunk Assessment   Upper Extremity Assessment Upper Extremity Assessment: Generalized weakness    Lower Extremity Assessment Lower Extremity Assessment: Generalized weakness    Cervical / Trunk Assessment Cervical / Trunk Assessment: Normal  Communication   Communication: No difficulties  Cognition Arousal/Alertness: Awake/alert Behavior During Therapy: WFL for tasks assessed/performed Overall Cognitive Status: Within Functional Limits for tasks assessed                                        General Comments      Exercises     Assessment/Plan    PT Assessment Patient needs continued PT services  PT Problem List Decreased strength;Decreased activity tolerance;Decreased balance;Decreased mobility       PT Treatment Interventions Balance training;Gait training;Stair training;Functional mobility training;Therapeutic activities;Therapeutic exercise;Patient/family education    PT Goals (Current goals can be found in the Care Plan section)  Acute Rehab PT Goals Patient Stated Goal: return home with family to assist after completing rehab PT Goal Formulation: With patient Time For Goal Achievement: 05/13/19 Potential to Achieve Goals: Good    Frequency Min 3X/week   Barriers to discharge        Co-evaluation               AM-PAC PT "6 Clicks" Mobility  Outcome Measure Help needed turning from your back to your side while in a flat bed without using bedrails?: None Help needed moving from lying on your back to sitting on the side of a flat bed without using bedrails?: A Little Help needed moving to and from a bed to a chair (including a wheelchair)?: A Little Help needed standing up from a chair using your arms (e.g., wheelchair or bedside chair)?: A Little Help needed to walk in  hospital room?: A Little Help needed climbing 3-5 steps with a railing? : A Lot 6 Click Score: 18    End of Session   Activity Tolerance: Patient tolerated treatment well;Patient limited by fatigue Patient left: in chair;with call bell/phone within reach;with chair alarm set Nurse Communication: Mobility status PT Visit Diagnosis: Unsteadiness on feet (R26.81);Other abnormalities of gait and mobility (R26.89);Muscle weakness (generalized) (M62.81)    Time: 1432-1500 PT Time Calculation (min) (ACUTE ONLY): 28 min   Charges:   PT Evaluation $PT Eval Moderate Complexity: 1 Mod PT Treatments $Therapeutic Activity: 23-37 mins        3:18 PM, 04/29/19 Lonell Grandchild, MPT Physical Therapist with [facility] [phone] office [phone] mobile phone

## 2019-04-29 NOTE — Progress Notes (Signed)
Progress Note  Patient Name: Renee Blake Date of Encounter: 04/29/2019  Primary Cardiologist: Kate Sable, MD   Subjective   Resting comfortably.  No complaints.  She is very appreciative of her care.  Daughters not present at the time of my evaluation.  Inpatient Medications    Scheduled Meds: . diltiazem  90 mg Oral BID  . enoxaparin (LOVENOX) injection  40 mg Subcutaneous Q24H  . feeding supplement  1 Container Oral BID BM  . furosemide  40 mg Intravenous Daily  . levothyroxine  25 mcg Oral QAC breakfast  . metoprolol succinate  50 mg Oral Daily  . multivitamin with minerals  1 tablet Oral Daily  . pregabalin  25 mg Oral BID  . vitamin B-12  500 mcg Oral Daily   Continuous Infusions:  PRN Meds: acetaminophen, ALPRAZolam, methocarbamol   Vital Signs    Vitals:   04/29/19 0407 04/29/19 0408 04/29/19 1001 04/29/19 1002  BP:  110/67 126/67   Pulse:  79  90  Resp:  17    Temp:  97.6 F (36.4 C)    TempSrc:  Oral    SpO2:  94%    Weight: 54.1 kg     Height:        Intake/Output Summary (Last 24 hours) at 04/29/2019 1022 Last data filed at 04/29/2019 0900 Gross per 24 hour  Intake 480 ml  Output 350 ml  Net 130 ml   Filed Weights   04/27/19 0500 04/28/19 0500 04/29/19 0407  Weight: 56.7 kg 56.1 kg 54.1 kg    Telemetry    Rate controlled atrial fibrillation- Personally Reviewed   Physical Exam   GEN: No acute distress, frail appearing.   Neck: No JVD Cardiac:  Irregularly irregular, no murmurs, rubs, or gallops.  Respiratory:  Diminished sounds at bases bilaterally. GI: Soft, nontender, non-distended  MS: No edema; No deformity. Neuro:  Nonfocal  Psych: Normal affect   Labs    Chemistry Recent Labs  Lab 04/26/19 1947 04/28/19 0445 04/29/19 0440  NA 134* 138 138  K 4.7 3.5 3.3*  CL 103 106 105  CO2 22 24 25   GLUCOSE 112* 82 79  BUN 26* 17 17  CREATININE 1.26* 1.01* 1.09*  CALCIUM 8.3* 8.5* 8.4*  PROT 7.0  --   --   ALBUMIN  3.4*  --   --   AST 48*  --   --   ALT 82*  --   --   ALKPHOS 100  --   --   BILITOT 0.2*  --   --   GFRNONAA 38* 50* 45*  GFRAA 44* 58* 52*  ANIONGAP 9 8 8      Hematology Recent Labs  Lab 04/26/19 1947  WBC 10.0  RBC 3.68*  HGB 10.3*  HCT 35.8*  MCV 97.3  MCH 28.0  MCHC 28.8*  RDW 16.5*  PLT 292    Cardiac EnzymesNo results for input(s): TROPONINI in the last 168 hours. No results for input(s): TROPIPOC in the last 168 hours.   BNP Recent Labs  Lab 04/26/19 1947  BNP 947.0*     DDimer No results for input(s): DDIMER in the last 168 hours.   Radiology    No results found.  Cardiac Studies   Echocardiogram: 01/28/2019 IMPRESSIONS   1. Left ventricular ejection fraction, by visual estimation, is 60 to 65%. The left ventricle has normal function. There is no left ventricular hypertrophy. 2. Elevated left atrial pressure. 3. Left ventricular diastolic  parameters are consistent with Grade II diastolic dysfunction (pseudonormalization). 4. Global right ventricle has normal systolic function.The right ventricular size is normal. No increase in right ventricular wall thickness. 5. Left atrial size was severely dilated. 6. Right atrial size was mildly dilated. 7. The mitral valve is abnormal. Moderate mitral valve regurgitation. No evidence of mitral stenosis. 8. The MR vena contracta is 0.6 cm. The MV/AV TVI ratio is 1.1. Findings support moderate mitral regurgitation. 9. The tricuspid valve is normal in structure. Tricuspid valve regurgitation is mild. 10. The aortic valve is tricuspid. Aortic valve regurgitation is not visualized. No evidence of aortic valve sclerosis or stenosis. 11. The pulmonic valve was not well visualized. Pulmonic valve regurgitation is mild. 12. Moderately elevated pulmonary artery systolic pressure. 13. The inferior vena cava is normal in size with greater than 50% respiratory variability, suggesting right atrial pressure of 3  mmHg.  Patient Profile     86 y.o. female with past medical history of persistent atrial fibrillation, chronic diastolic CHF, HTN, history of GIB, chronic anemia, Stage 3 CKD, and history of anorectal cancer who is being seen today for the evaluation of medication management at the request of Dr. Dyann Kief.   Assessment & Plan    1.  Acute on chronic diastolic heart failure: BNP elevated at 947 and chest CT showed small to moderate bilateral pleural effusions.  IV Lasix increased to 40 mg daily on 04/27/2018.  Weight remains above baseline.  I would recommend switching oral Lasix to torsemide at the time of discharge.  She is hypokalemic and this is being replaced.  2.  Persistent atrial fibrillation: Heart rates are controlled on Cardizem SR 90 mg twice daily and Toprol-XL 50 mg daily.  Given severe left atrial enlargement I will adopt a strategy of rate control as she is unlikely to achieve sinus rhythm.  She is not a candidate for anticoagulation given history of chronic anemia and GI bleeding.  3.  Chronic anemia: No signs of active bleeding.  Hemoglobin 10.3 on admission.  4.  Mitral regurgitation: Moderate in severity by echocardiogram October 2020.  Continue to follow in outpatient setting.  5.  Failure to thrive: Palliative care has been consulted and is following.    For questions or updates, please contact [place] Please consult [email or web address] for contact info under Cardiology/STEMI.      Signed, Kate Sable, MD  04/29/2019, 10:22 AM

## 2019-04-29 NOTE — TOC Initial Note (Signed)
Transition of Care [facility]) - Initial/Assessment Note    Patient Details  Name: Renee Blake MRN: [id number] Date of Birth: 11/21/1928  Transition of Care [facility]) CM/SW Contact:    Renee Blake Phone Number: 04/29/2019, 2:55 PM  Clinical Narrative:  Patient is from [facility]. Family originally consulted hospice but now patient has stabilized and A&Ox 4 and not interested in hospice services.  Family has cleaned out her room at [facility] and is not paying daily fee to hold her bed. [facility] can and will accept patient back if bed is still available at time of DC.   Discussed with Renee Blake-daughter, she would like referral to [facility] in case patient can not go to [facility]. Discussed referral with Renee Blake of [facility], no bed availability for patient.        Expected Discharge Plan: Home w Hospice Care Barriers to Discharge: Continued Medical Work up   Patient Goals and CMS Choice Patient states their goals for this hospitalization and ongoing recovery are:: family is currently requesting residential hospice as discharge plan CMS Medicare.gov Compare Post Acute Care list provided to:: Patient Represenative (must comment) Choice offered to / list presented to : Adult Children  Expected Discharge Plan and Services Expected Discharge Plan: Home w Hospice Care In-house Referral: Clinical Social Work Discharge Planning Services: CM Consult Post Acute Care Choice: Hospice Living arrangements for the past 2 months: [place]                                      Prior Living Arrangements/Services Living arrangements for the past 2 months: Single Family Home Lives with:: Adult Children Patient language and need for interpreter reviewed:: Yes Do you feel safe going back to the place where you live?: No   Family explains that patient can experience "peace" more likely during her end of life stage at hospice rather than at home becuase of personal issues.  she  did not go into detail.  Need for Family Participation in Patient Care: Yes (Comment) Care giver support system in place?: Yes (comment) Current home services: DME Criminal Activity/Legal Involvement Pertinent to Current Situation/Hospitalization: No - Comment as needed  Activities of Daily Living Home Assistive Devices/Equipment: Eyeglasses, Environmental consultant (specify type), Shower chair with back ADL Screening (condition at time of admission) Patient's cognitive ability adequate to safely complete daily activities?: Yes Is the patient deaf or have difficulty hearing?: No Does the patient have difficulty seeing, even when wearing glasses/contacts?: No Does the patient have difficulty concentrating, remembering, or making decisions?: No Patient able to express need for assistance with ADLs?: Yes Does the patient have difficulty dressing or bathing?: Yes Independently performs ADLs?: No Communication: Independent Dressing (OT): Needs assistance Is this a change from baseline?: Pre-admission baseline Grooming: Needs assistance Is this a change from baseline?: Pre-admission baseline Feeding: Independent Bathing: Needs assistance Is this a change from baseline?: Pre-admission baseline Toileting: Needs assistance Is this a change from baseline?: Pre-admission baseline In/Out Bed: Needs assistance Is this a change from baseline?: Pre-admission baseline Walks in Home: Needs assistance Is this a change from baseline?: Pre-admission baseline Does the patient have difficulty walking or climbing stairs?: Yes Weakness of Legs: Both Weakness of Arms/Hands: Both  Permission Sought/Granted   Permission granted to share information with : Yes, Release of Information Signed, Yes, Verbal Permission Granted  Share Information with Renee Blake  Permission granted to  share info w AGENCY: [place] granted to share info w Relationship: Daughter  Permission granted to  share info w Contact Information: [facility] [phone]  Emotional Assessment Appearance:: Appears stated age Attitude/Demeanor/Rapport: Unable to Assess Affect (typically observed): Unable to Assess Orientation: : Oriented to Self, Oriented to Place, Oriented to  Time, Oriented to Situation Alcohol / Substance Use: Not Applicable Psych Involvement: No (comment)  Admission diagnosis:  Hypoxia [R09.02] Pleural effusion, bilateral [J90] Longstanding persistent atrial fibrillation (HCC) [I48.11] Acute on chronic diastolic (congestive) heart failure (HCC) [I50.33] Patient Active Problem List   Diagnosis Date Noted  . Palliative care by specialist   . Encounter for hospice care discussion   . Protein-calorie malnutrition, severe 04/27/2019  . Acute on chronic diastolic (congestive) heart failure ([place]) 04/27/2019  . Normocytic anemia 04/26/2019  . Goals of care, counseling/discussion   . Palliative care encounter   . Hypotension 04/17/2019  . Debility 03/16/2019  . Shortness of breath 03/16/2019  . Cachexia ([place]) 03/16/2019  . Permanent atrial fibrillation ([place])   . Hypoxia 03/15/2019  . CHF exacerbation ([place]) 03/14/2019  . Bilateral pleural effusion 03/04/2019  . Chronic atrial fibrillation ([place]) 03/04/2019  . DNR (do not resuscitate) 03/04/2019  . Acute on chronic diastolic congestive heart failure ([place]) 03/02/2019  . Chronic diastolic heart failure ([place]) 03/02/2019  . Chronic renal failure, stage 3b 03/02/2019  . Acute respiratory failure with hypoxia ([place]) 03/02/2019  . Acute exacerbation of CHF (congestive heart failure) ([place]) 01/27/2019  . Atrial fibrillation with RVR ([place]) 01/27/2019  . Iron deficiency anemia due to chronic blood loss---H/o Gi Bleed 01/27/2019  . Vaginal mass 07/29/2017  . Heme positive stool 07/25/2017  . Cellulitis 12/10/2016  . Facial cellulitis   . Erroneous encounter - disregard 03/17/2015  . AKI (acute kidney injury) ([place])   . Metabolic acidosis  0000000  . Acute renal failure syndrome ([place])   . Renal failure 08/22/2014  . Hyperkalemia 08/22/2014  . UTI (urinary tract infection) 08/22/2014  . Acute renal failure ([place]) 08/22/2014  . Dyspnea 03/15/2014  . Type 2 diabetes mellitus ([place]) 06/08/2013  . Acute diastolic congestive heart failure ([place]) 06/07/2013  . Syncope 03/26/2013  . Rectal bleeding 03/26/2013  . Dehydration 03/26/2013  . Generalized weakness 03/26/2013  . Malnutrition of moderate degree ([place]) 03/01/2013  . Diarrhea 02/28/2013  . Loss of weight 02/28/2013  . H/o Prior duodenal ulcer with hemorrhage 02/27/2013  . H/o Prior Lower GI bleed (anorectal Ca) 02/26/2013  . Acute blood loss anemia 02/26/2013  . UTI (lower urinary tract infection) 02/26/2013  . Hypertension 02/26/2013   PCP:  [name], MD Pharmacy:   [facility] [address] [place] [address] Phone: [phone] Fax: [phone]  [facility] [phone] - [place], [place] - [address] [place] [address] Phone: [phone] Fax: [phone]     Social Determinants of Health (SDOH) Interventions    Readmission Risk Interventions Readmission Risk Prevention Plan 04/20/2019 03/15/2019 03/02/2019  Transportation Screening Complete Complete Complete  PCP or Specialist Appt within 5-7 Days - - -  PCP or Specialist Appt within 3-5 Days - - Complete  Home Care Screening - - -  Medication Review (Blake CM) - - -  [place] or Home Care Consult - Complete Complete  Social Work Consult for [place] Planning/Counseling - Complete Complete  Palliative Care  Screening - Not Applicable Complete  Medication Review (Blake Care Manager) Complete Complete Complete  PCP or Specialist appointment within 3-5 days of discharge Not Complete - -  [place]RI or Home Care Consult Complete - -  SW Recovery Care/Counseling Consult Complete - -  Palliative Care Screening Not  Complete - -  Skilled Nursing Facility Complete - -  Some recent data might be hidden

## 2019-04-29 NOTE — Plan of Care (Signed)
  Problem: Acute Rehab PT Goals(only PT should resolve) Goal: Pt Will Go Supine/Side To Sit Outcome: Progressing Flowsheets (Taken 04/29/2019 1519) Pt will go Supine/Side to Sit:  with supervision  with modified independence Goal: Patient Will Transfer Sit To/From Stand Outcome: Progressing Flowsheets (Taken 04/29/2019 1519) Patient will transfer sit to/from stand:  with min guard assist  with supervision Goal: Pt Will Transfer Bed To Chair/Chair To Bed Outcome: Progressing Flowsheets (Taken 04/29/2019 1519) Pt will Transfer Bed to Chair/Chair to Bed:  min guard assist  with supervision Goal: Pt Will Ambulate Outcome: Progressing Flowsheets (Taken 04/29/2019 1519) Pt will Ambulate:  100 feet  with supervision  with min guard assist  with rolling walker   3:20 PM, 04/29/19 Lonell Grandchild, MPT Physical Therapist with [facility] [phone] office [phone] mobile phone

## 2019-04-29 NOTE — Progress Notes (Addendum)
Patients daughter Renee Blake called to gain information about her mother.  She is listed as an emergency contact but not listed as the active health care power of attorney.  Mrs Renee Blake asked what hospice is for and I told her that usually hospice is used when someone has a chronic condition or disease that is not going to get better.  She then inquired if if the patient was dying.  I told Renee Blake that she would have to contact her sisters for any information on the patient due to the fact that she was not listed as the [place].  Renee Blake then asked if the patient was being discharged tomorrow because she would be the one picking her up, and again I told her that she would have to talk to her sisters for any information on the patient.

## 2019-04-30 ENCOUNTER — Inpatient Hospital Stay (HOSPITAL_COMMUNITY): Payer: Medicare Other

## 2019-04-30 LAB — BASIC METABOLIC PANEL
Anion gap: 11 (ref 5–15)
BUN: 13 mg/dL (ref 8–23)
CO2: 24 mmol/L (ref 22–32)
Calcium: 8.8 mg/dL — ABNORMAL LOW (ref 8.9–10.3)
Chloride: 106 mmol/L (ref 98–111)
Creatinine, Ser: 0.93 mg/dL (ref 0.44–1.00)
GFR calc Af Amer: >60 mL/min (ref >60)
GFR calc non Af Amer: 55 mL/min — ABNORMAL LOW (ref >60)
Glucose, Bld: 92 mg/dL (ref 70–99)
Potassium: 3.5 mmol/L (ref 3.5–5.1)
Sodium: 141 mmol/L (ref 135–145)

## 2019-04-30 LAB — MAGNESIUM: Magnesium: 2.1 mg/dL (ref 1.7–2.4)

## 2019-04-30 IMAGING — DX DG CHEST DECUBITUS BILAT
2 series · 2 of 2 positions shown · non-contrast
Comparison: CT chest and chest x-ray dated [DATE].

CLINICAL DATA: Heart failure.

EXAM:
CHEST - BILATERAL DECUBITUS VIEW

[chest decu (1 of 2)]
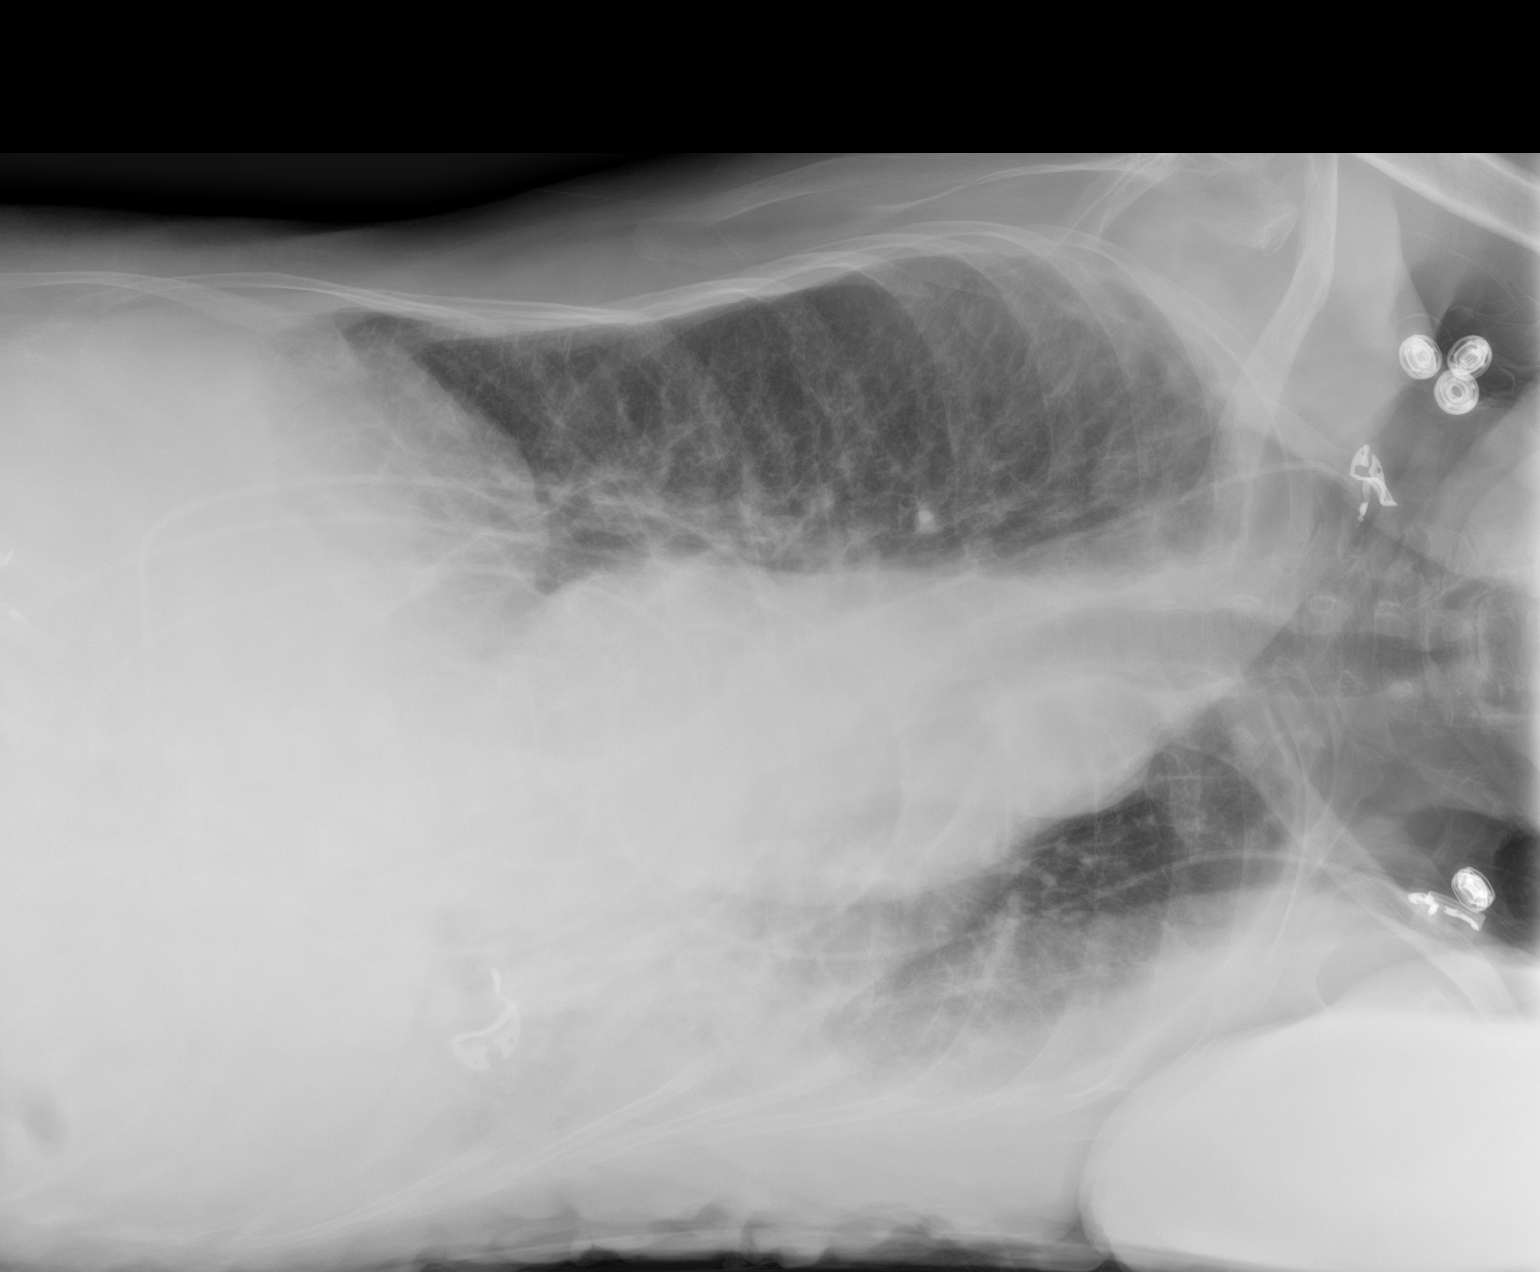

[chest decu (2 of 2)]
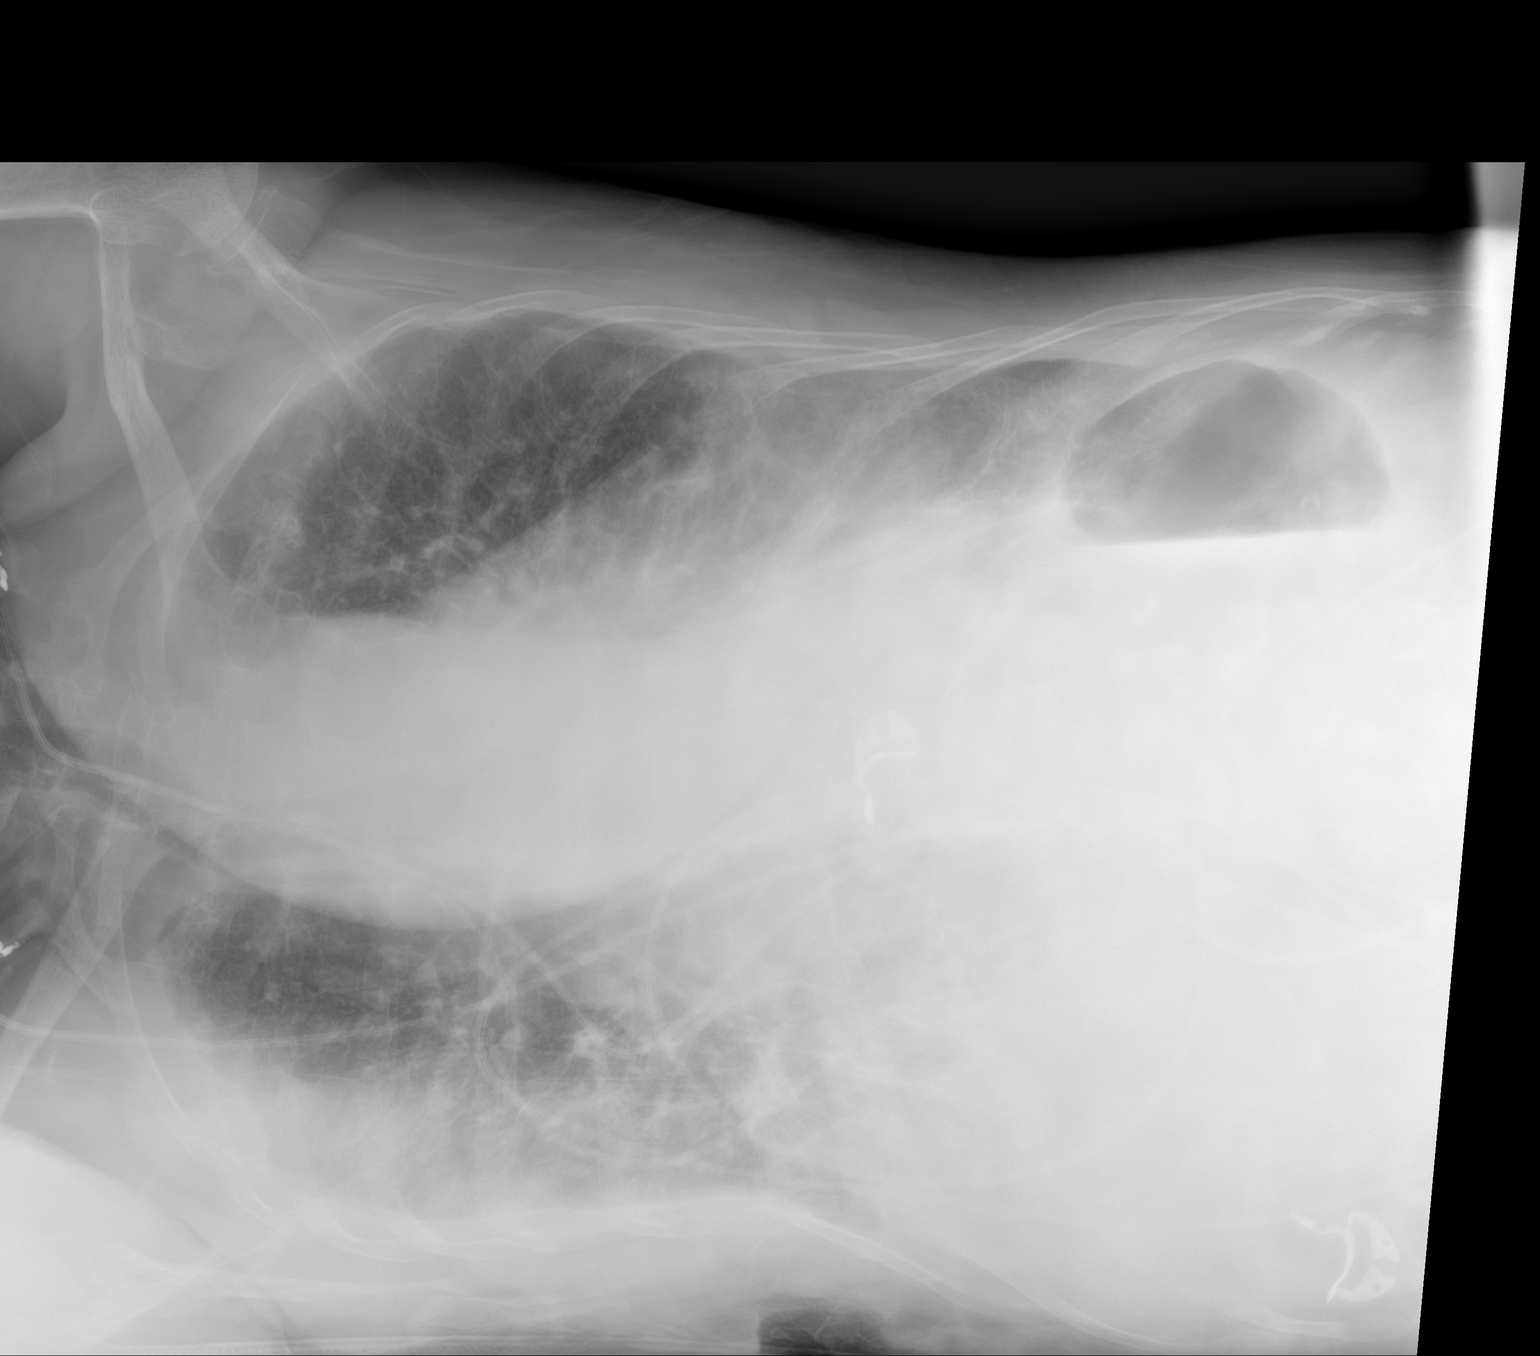

[2 of 2 positions shown; findings below may reference images not displayed]

FINDINGS: Bilateral decubitus x-rays demonstrate small layering pleural
effusions. Unchanged cardiomegaly and interstitial edema.
IMPRESSION: 1. Stable congestive heart failure with interstitial edema and small
bilateral pleural effusions.

## 2019-04-30 MED ORDER — POTASSIUM CHLORIDE CRYS ER 20 MEQ PO TBCR
20.0000 meq | EXTENDED_RELEASE_TABLET | Freq: Every day | ORAL | Status: DC
  Administered 2019-04-30 – 2019-05-04 (×5): 20 meq via ORAL
  Filled 2019-04-30 (×5): qty 1

## 2019-04-30 MED ORDER — FUROSEMIDE 10 MG/ML IJ SOLN
40.0000 mg | Freq: Two times a day (BID) | INTRAMUSCULAR | Status: DC
  Administered 2019-04-30 – 2019-05-04 (×8): 40 mg via INTRAVENOUS
  Filled 2019-04-30 (×8): qty 4

## 2019-04-30 MED ORDER — ONDANSETRON HCL 4 MG/2ML IJ SOLN
4.0000 mg | Freq: Four times a day (QID) | INTRAMUSCULAR | Status: DC | PRN
  Administered 2019-04-30: 4 mg via INTRAVENOUS
  Filled 2019-04-30: qty 2

## 2019-04-30 NOTE — Progress Notes (Signed)
C/O nausea and leg pain which she says she will experience together at home about every other day.  Contacted Dr. Carles Collet and he ordered zofran which patient received along with tylenol.  When gave zofran paitient, was eating a butter biscuit which daughter had brought in. Daughter at bedside.

## 2019-04-30 NOTE — Progress Notes (Signed)
Physical Therapy Treatment Patient Details Name: Renee Blake MRN: [id number] DOB: 12/28/1928 Today's Date: 04/30/2019    History of Present Illness Renee Blake is a 86 y.o. female with medical history significant of history of anemia, chronic atrial fibrillation, chronic diarrhea, dilation of biliary tract, esophageal stricture, duodenal stricture, sliding hiatal hernia, gastric ulcer, history of GI bleed, hyperlipidemia, hypertension, microscopic colitis, nephrolithiasis, pancreatic duct dilation, Schatzki ring, skin cancer, vertigo who was recently admitted from January 16 to January 16 due to diarrhea, malnutrition, weakness and diastolic CHF exacerbation.  She is sent from [facility] facility [facility]) due to confusion and hot flashes.  Her daughter stated to Dr. Rogene Houston that she usually has these hot flashes episodes when [name] starts acting up.  The patient is unable to elaborate much but has been complaining of lower extremity discomfort.  She has been having increased oxygen requirement from her baseline 2 LPM via [place].    PT Comments    Patient demonstrates improvement for transfers and walking short distances without AD, but has to lean on nearby objects for support most of time, safer using RW, ambulated in hallway without loss of balance and tolerated sitting up in chair after therapy.  Patient will benefit from continued physical therapy in hospital and recommended venue below to increase strength, balance, endurance for safe ADLs and gait.    Follow Up Recommendations  SNF;Supervision for mobility/OOB;Supervision - Intermittent     Equipment Recommendations  None recommended by PT    Recommendations for Other Services       Precautions / Restrictions Precautions Precautions: Fall Restrictions Weight Bearing Restrictions: No    Mobility  Bed Mobility Overal bed mobility: Modified Independent             General bed mobility comments:  slightly labored movement  Transfers Overall transfer level: Needs assistance Equipment used: Rolling walker (2 wheeled);None Transfers: Sit to/from [facility] Sit to Stand: Supervision Stand pivot transfers: Supervision       General transfer comment: slightly increased time, labored movement  Ambulation/Gait Ambulation/Gait assistance: Supervision Gait Distance (Feet): 85 Feet Assistive device: Rolling walker (2 wheeled) Gait Pattern/deviations: Decreased step length - right;Decreased step length - left;Decreased stride length Gait velocity: decreased   General Gait Details: increased endurance/distance for ambulation with slow labored cadence, no loss of balance, limited secondary to c/o fatigue   Stairs             Wheelchair Mobility    Modified Rankin (Stroke Patients Only)       Balance Overall balance assessment: Needs assistance Sitting-balance support: Feet supported;No upper extremity supported Sitting balance-Leahy Scale: Good Sitting balance - Comments: seated EOB   Standing balance support: During functional activity;No upper extremity supported Standing balance-Leahy Scale: Poor Standing balance comment: fair/poor without AD, fair/good using RW                            Cognition Arousal/Alertness: Awake/alert Behavior During Therapy: WFL for tasks assessed/performed Overall Cognitive Status: Within Functional Limits for tasks assessed                                        Exercises      General Comments        Pertinent Vitals/Pain Pain Assessment: No/denies pain    Home Living  Prior Function            PT Goals (current goals can now be found in the care plan section) Acute Rehab PT Goals Patient Stated Goal: return home with family to assist PT Goal Formulation: With patient Time For Goal Achievement: 05/13/19 Potential to Achieve Goals:  Good Progress towards PT goals: Progressing toward goals    Frequency    Min 3X/week      PT Plan Current plan remains appropriate    Co-evaluation              AM-PAC PT "6 Clicks" Mobility   Outcome Measure  Help needed turning from your back to your side while in a flat bed without using bedrails?: None Help needed moving from lying on your back to sitting on the side of a flat bed without using bedrails?: None Help needed moving to and from a bed to a chair (including a wheelchair)?: A Little Help needed standing up from a chair using your arms (e.g., wheelchair or bedside chair)?: None Help needed to walk in hospital room?: A Little Help needed climbing 3-5 steps with a railing? : A Little 6 Click Score: 21    End of Session Equipment Utilized During Treatment: Gait belt Activity Tolerance: Patient tolerated treatment well;Patient limited by fatigue Patient left: in chair;with call bell/phone within reach;with chair alarm set Nurse Communication: Mobility status PT Visit Diagnosis: Unsteadiness on feet (R26.81);Other abnormalities of gait and mobility (R26.89);Muscle weakness (generalized) (M62.81)     Time: [id number] PT Time Calculation (min) (ACUTE ONLY): 26 min  Charges:  $Therapeutic Activity: 23-37 mins                     4:15 PM, 04/30/19 Lonell Grandchild, MPT Physical Therapist with [facility] 336 [phone] office [phone] mobile phone

## 2019-04-30 NOTE — Progress Notes (Signed)
Progress Note  Patient Name: Renee Blake Date of Encounter: 04/30/2019  Primary Cardiologist: Kate Sable, MD   Subjective   Resting comfortably.  Says breathing is slightly improved over last 2 days.  Daughters not present at the time of my evaluation.  Inpatient Medications    Scheduled Meds: . diltiazem  90 mg Oral BID  . enoxaparin (LOVENOX) injection  40 mg Subcutaneous Q24H  . feeding supplement  1 Container Oral BID BM  . furosemide  40 mg Intravenous Daily  . levothyroxine  25 mcg Oral QAC breakfast  . metoprolol succinate  50 mg Oral Daily  . multivitamin with minerals  1 tablet Oral Daily  . pregabalin  25 mg Oral BID  . vitamin B-12  500 mcg Oral Daily   Continuous Infusions:  PRN Meds: acetaminophen, ALPRAZolam, methocarbamol   Vital Signs    Vitals:   04/29/19 2052 04/29/19 2102 04/30/19 0357 04/30/19 0615  BP:  127/74  113/71  Pulse:  80  70  Resp:  20  16  Temp:  98.5 F (36.9 C)  98.2 F (36.8 C)  TempSrc:  Oral    SpO2: 97% 96%  96%  Weight:   54.5 kg   Height:        Intake/Output Summary (Last 24 hours) at 04/30/2019 0839 Last data filed at 04/29/2019 1934 Gross per 24 hour  Intake 480 ml  Output --  Net 480 ml   Filed Weights   04/28/19 0500 04/29/19 0407 04/30/19 0357  Weight: 56.1 kg 54.1 kg 54.5 kg    Telemetry    Rate controlled atrial fibrillation- Personally Reviewed   Physical Exam   GEN: No acute distress, frail appearing.   Neck: No JVD Cardiac:  Irregularly irregular, no murmurs, rubs, or gallops.  Respiratory:  Faint crackles at bases bilaterally. GI: Soft, nontender, non-distended  MS: No edema; No deformity. Neuro:  Nonfocal  Psych: Normal affect   Labs    Chemistry Recent Labs  Lab 04/26/19 1947 04/26/19 1947 04/28/19 0445 04/29/19 0440 04/30/19 0629  NA 134*   < > 138 138 141  K 4.7   < > 3.5 3.3* 3.5  CL 103   < > 106 105 106  CO2 22   < > 24 25 24   GLUCOSE 112*   < > 82 79 92  BUN  26*   < > 17 17 13   CREATININE 1.26*   < > 1.01* 1.09* 0.93  CALCIUM 8.3*   < > 8.5* 8.4* 8.8*  PROT 7.0  --   --   --   --   ALBUMIN 3.4*  --   --   --   --   AST 48*  --   --   --   --   ALT 82*  --   --   --   --   ALKPHOS 100  --   --   --   --   BILITOT 0.2*  --   --   --   --   GFRNONAA 38*   < > 50* 45* 55*  GFRAA 44*   < > 58* 52* >60  ANIONGAP 9   < > 8 8 11    < > = values in this interval not displayed.     Hematology Recent Labs  Lab 04/26/19 1947  WBC 10.0  RBC 3.68*  HGB 10.3*  HCT 35.8*  MCV 97.3  MCH 28.0  MCHC 28.8*  RDW  16.5*  PLT 292    Cardiac EnzymesNo results for input(s): TROPONINI in the last 168 hours. No results for input(s): TROPIPOC in the last 168 hours.   BNP Recent Labs  Lab 04/26/19 1947  BNP 947.0*     DDimer No results for input(s): DDIMER in the last 168 hours.   Radiology    No results found.  Cardiac Studies   Echocardiogram: 01/28/2019 IMPRESSIONS   1. Left ventricular ejection fraction, by visual estimation, is 60 to 65%. The left ventricle has normal function. There is no left ventricular hypertrophy. 2. Elevated left atrial pressure. 3. Left ventricular diastolic parameters are consistent with Grade II diastolic dysfunction (pseudonormalization). 4. Global right ventricle has normal systolic function.The right ventricular size is normal. No increase in right ventricular wall thickness. 5. Left atrial size was severely dilated. 6. Right atrial size was mildly dilated. 7. The mitral valve is abnormal. Moderate mitral valve regurgitation. No evidence of mitral stenosis. 8. The MR vena contracta is 0.6 cm. The MV/AV TVI ratio is 1.1. Findings support moderate mitral regurgitation. 9. The tricuspid valve is normal in structure. Tricuspid valve regurgitation is mild. 10. The aortic valve is tricuspid. Aortic valve regurgitation is not visualized. No evidence of aortic valve sclerosis or stenosis. 11. The  pulmonic valve was not well visualized. Pulmonic valve regurgitation is mild. 12. Moderately elevated pulmonary artery systolic pressure. 13. The inferior vena cava is normal in size with greater than 50% respiratory variability, suggesting right atrial pressure of 3 mmHg.  Patient Profile     86 y.o. female with past medical history of persistent atrial fibrillation, chronic diastolic CHF, HTN, history of GIB, chronic anemia, Stage 3 CKD, and history of anorectal cancer who is being seen today for the evaluation of medication management at the request of Dr. Dyann Kief.   Assessment & Plan    1.  Acute on chronic diastolic heart failure: BNP elevated at 947 and chest CT showed small to moderate bilateral pleural effusions.  IV Lasix increased to 40 mg daily on 04/27/2018.  Weight remains above baseline.  I would recommend switching oral Lasix to torsemide at the time of discharge.  I will obtain a chest xray to assess size of pleural effusions. Doubt I/O accuracy. Consider giving 2.5 mg metolazone but await CXR results first.  2.  Persistent atrial fibrillation: Heart rates are controlled on Cardizem SR 90 mg twice daily and Toprol-XL 50 mg daily.  Given severe left atrial enlargement I will adopt a strategy of rate control as she is unlikely to achieve sinus rhythm.  She is not a candidate for anticoagulation given history of chronic anemia and GI bleeding.  3.  Chronic anemia: No signs of active bleeding.  Hemoglobin 10.3 on admission.  4.  Mitral regurgitation: Moderate in severity by echocardiogram October 2020.  Continue to follow in outpatient setting.  5.  Failure to thrive: Palliative care has been consulted and is following.    For questions or updates, please contact Theresa Please consult [email or web address] for contact info under Cardiology/STEMI.      Signed, Kate Sable, MD  04/30/2019, 8:39 AM

## 2019-04-30 NOTE — Progress Notes (Signed)
PROGRESS NOTE  Renee Blake [id number] DOB: 1928/11/10 DOA: 04/26/2019 PCP: [name], MD  Brief History: 86 year old female with a history of permanent atrial fibrillation not on anticoagulation secondary to GI bleed, diastolic CHF, CKD stage III, endometrial carcinoma, and squamous cell carcinoma of the anal canal [facility], [facility] edema,and hot [facility].She was recently admitted from 03/13/19 to 03/18/19 with acute on chronic dCHF. Her d/c weight was 112.8 lbs, and she was d/ced with lasix 40 mg daily. Initial labs showed WBC 6.3, Hgb 8.6, platelets 232, Na+ 140, K+ 3.4 and creatinine 1.14. Mg 1.9. BNP 947. COVID negative. CXR showed increased streaky right lung opacity thought to be most consistent with atelectasis but infection could not be ruled-out. Chest CT showed small to moderate bilateral pleural effusions, areas of atelectasis and peribronchial thickening compatible with bronchitis. EKG shows atrial fibrillation, HR 80 with RBBB and LAFB.   She was admitted for an acute CHF exacerbation and has been started on IV Lasix 20mg .Strict I&O's have not been recorded but she had 4 urine occurrences yesterday. Weight listed as 125 lbs on admission, at 123 lbs today.  Assessment/Plan: Acute respiratory failure with hypoxia -Secondary to exacerbation of diastolic CHF -Patient was on 3L -now weaned to RA  Acute on chronic diastolic CHF -appreciate cardiology follow up -continueIVlasix and metoprolol succinate -increase IV lasix to bid -case discussed with cardiology, Dr. Bronson Ing -personally reviewed CXR--increased interstitial marking -01/19/19 Echo EF 60-65%, G2DD, moderate elevated PASP -12/17/21discharge weight 112.8 lbs  Permanent Atrial Fibrillation -continue diltiazemSR and metoprolol succinate -amiodarone stopped during December 2020 admission -not on [facility] due to GIB,duodenal ulcers,  anemia -rate controlled  Nausea/hot flash/FTT -Post ingestion of morning meds developing nausea, hot flashes, but no vomiting -evaluated medication and dividedthe doses -As needed antiemetics -No signs of infection -History of GI bleed,>> H&H remained stable -repeat Hemoccult--neg -CEA normal at 4.4 -03/18/19 TSH 13.447 -03/18/19 Free T4--1.07 -check B12--150 on 03/18/19 -folate 13.4 -tolerating diet presently  CKD stage 3b -baseline creatinine 1.2-1.4 -monitor with diuresis -am BMP  History ofSCCanorectal cancer/endometrial carcinoma/ -Hemoccult--neg -Stable -CEA normal at 4.4 -patient followed by radoncology, GYN and medical oncology team from [facility] by [place]. No recent follow-up -12/16 CT abd/pelvis--no acute findings, no mets, nonobstructine R-renal calculi -Last Colonoscopy:April 2019-new findings include nodular hard mass anterior anal orifice, DRE revealed 5 cm hard rectal masscircumferential anterior bowel wall, 6 mm polyp distal sigmoid. Path w/rectum distal lesion biopsy-at least high-grade squamous intraepithelial lesion suspicious for invasion, sigmoid polyp inflammatory  Goals of Care -Advance care planning, including the explanation and discussion of advance directives was carried out with the patient and family. Code status including explanations of "Full Code" and "DNR" and alternatives were discussed in detail. Discussion of end-of-life issues including but not limited palliative care, hospice care and the concept of hospice, other end-of-life care options, power of attorney for health care decisions, living wills, and physician orders for life-sustaining treatment were also discussed with the patient and family. Total face to face time 16 minutes. -consult palliative med   Total time spent 35 minutes.  Greater than 50% spent face to face counseling and coordinating care.   Disposition Plan:Home with cleared by cardiology--refuses SNF--pt remains  fluid overloaded Family Communication:Daughter updatedon phone 1/29  Consultants:cardiology  Code Status: DNR  DVT Prophylaxis: [place] Lovenox   Procedures: As Listed in Progress Note Above  Antibiotics: None  Subjective: Patient denies fevers,  chills, headache, chest pain, dyspnea, vomiting, diarrhea, abdominal pain, dysuria, hematuria, hematochezia, and melena.  Pt complains of intermittent nausea   Objective: Vitals:   04/29/19 2102 04/30/19 0357 04/30/19 0615 04/30/19 1604  BP: 127/74  113/71 122/63  Pulse: 80  70 74  Resp: 20  16 18   Temp: 98.5 F (36.9 C)  98.2 F (36.8 C) 98.1 F (36.7 C)  TempSrc: Oral     SpO2: 96%  96% 100%  Weight:  54.5 kg    Height:        Intake/Output Summary (Last 24 hours) at 04/30/2019 1750 Last data filed at 04/30/2019 0900 Gross per 24 hour  Intake 480 ml  Output --  Net 480 ml   Weight change: 0.4 kg Exam:   General:  Pt is alert, follows commands appropriately, not in acute distress  HEENT: No icterus, No thrush, No neck mass, [place]/AT  Cardiovascular: IRRR, S1/S2, no rubs, no gallops  Respiratory: bibasilar crackles. No wheeze  Abdomen: Soft/+BS, non tender, non distended, no guarding  Extremities: No edema, No lymphangitis, No petechiae, No rashes, no synovitis   Data Reviewed: I have personally reviewed following labs and imaging studies Basic Metabolic Panel: Recent Labs  Lab 04/26/19 1947 04/28/19 0445 04/29/19 0440 04/30/19 0629  NA 134* 138 138 141  K 4.7 3.5 3.3* 3.5  CL 103 106 105 106  CO2 22 24 25 24   GLUCOSE 112* 82 79 92  BUN 26* 17 17 13   CREATININE 1.26* 1.01* 1.09* 0.93  CALCIUM 8.3* 8.5* 8.4* 8.8*  MG  --   --   --  2.1   Liver Function Tests: Recent Labs  Lab 04/26/19 1947  AST 48*  ALT 82*  ALKPHOS 100  BILITOT 0.2*  PROT 7.0  ALBUMIN 3.4*   No results for input(s): LIPASE, AMYLASE in the last 168 hours. No results for input(s): AMMONIA in the last 168  hours. Coagulation Profile: No results for input(s): INR, PROTIME in the last 168 hours. CBC: Recent Labs  Lab 04/26/19 1947  WBC 10.0  NEUTROABS 7.4  HGB 10.3*  HCT 35.8*  MCV 97.3  PLT 292   Cardiac Enzymes: No results for input(s): CKTOTAL, CKMB, CKMBINDEX, TROPONINI in the last 168 hours. BNP: Invalid input(s): POCBNP CBG: No results for input(s): GLUCAP in the last 168 hours. HbA1C: No results for input(s): HGBA1C in the last 72 hours. Urine analysis:    Component Value Date/Time   COLORURINE YELLOW 01/27/2019 1533   APPEARANCEUR CLOUDY (A) 01/27/2019 1533   LABSPEC 1.005 01/27/2019 1533   PHURINE 7.0 01/27/2019 1533   GLUCOSEU NEGATIVE 01/27/2019 1533   HGBUR SMALL (A) 01/27/2019 1533   BILIRUBINUR NEGATIVE 01/27/2019 1533   [place] 01/27/2019 1533   PROTEINUR NEGATIVE 01/27/2019 1533   UROBILINOGEN 0.2 08/22/2014 1300   NITRITE NEGATIVE 01/27/2019 1533   LEUKOCYTESUR LARGE (A) 01/27/2019 1533   Sepsis Labs: @LABRCNTIP (procalcitonin:4,lacticidven:4) ) Recent Results (from the past 240 hour(s))  SARS CORONAVIRUS 2 ([facility] 6-24 HRS) Nasopharyngeal Nasopharyngeal Swab     Status: None   Collection Time: 04/26/19 10:52 PM   Specimen: Nasopharyngeal Swab  Result Value Ref Range Status   SARS Coronavirus 2 NEGATIVE NEGATIVE Final    Comment: (NOTE) SARS-CoV-2 target nucleic acids are NOT DETECTED. The SARS-CoV-2 RNA is generally detectable in upper and lower respiratory specimens during the acute phase of infection. Negative results do not preclude SARS-CoV-2 infection, do not rule out co-infections with other pathogens, and should not be used  as the sole basis for treatment or other patient management decisions. Negative results must be combined with clinical observations, patient history, and epidemiological information. The expected result is Negative. Fact Sheet for Patients: SugarRoll.be Fact Sheet for Healthcare  Providers: https://www.woods-mathews.com/ This test is not yet approved or cleared by the [place] FDA and  has been authorized for detection and/or diagnosis of SARS-CoV-2 by FDA under an Emergency Use Authorization (EUA). This EUA will remain  in effect (meaning this test can be used) for the duration of the COVID-19 declaration under Section 56 4(b)(1) of the Act, 21 U.S.C. section 360bbb-3(b)(1), unless the authorization is terminated or revoked sooner. Performed at [facility], [place] [address], [place], [place] [address]   MRSA PCR Screening     Status: None   Collection Time: 04/27/19  1:25 AM   Specimen: Nasal Mucosa; Nasopharyngeal  Result Value Ref Range Status   MRSA by PCR NEGATIVE NEGATIVE Final    Comment:        The GeneXpert MRSA Assay (FDA approved for NASAL specimens only), is one component of a comprehensive MRSA colonization surveillance program. It is not intended to diagnose MRSA infection nor to guide or monitor treatment for MRSA infections. Performed at [facility], [address] [place], [place] [address]      Scheduled Meds: . diltiazem  90 mg Oral BID  . enoxaparin (LOVENOX) injection  40 mg Subcutaneous Q24H  . feeding supplement  1 Container Oral BID BM  . furosemide  40 mg Intravenous BID  . levothyroxine  25 mcg Oral QAC breakfast  . metoprolol succinate  50 mg Oral Daily  . multivitamin with minerals  1 tablet Oral Daily  . pregabalin  25 mg Oral BID  . vitamin B-12  500 mcg Oral Daily   Continuous Infusions:  Procedures/Studies: CT Chest W Contrast  Result Date: 04/26/2019 CLINICAL DATA:  Pneumonia, effusions suspected. EXAM: CT CHEST WITH CONTRAST TECHNIQUE: Multidetector CT imaging of the chest was performed during intravenous contrast administration. CONTRAST:  12mL OMNIPAQUE IOHEXOL 300 MG/ML  SOLN COMPARISON:  Chest x-ray today.  Chest CT 04/06/2019 FINDINGS: Cardiovascular: Heart is enlarged. Aorta normal  caliber with scattered calcifications. No filling defects in the pulmonary arteries to suggest pulmonary emboli. Mediastinum/Nodes: No mediastinal, hilar, or axillary adenopathy. Scattered small mediastinal lymph nodes. Lungs/Pleura: Enlarging bilateral pleural effusions since prior chest CT. Right pleural effusion is loculated. Compressive atelectasis in the left lower lobe. Bandlike opacity in the right lower lobe, lingula and right middle lobe, likely areas of atelectasis. There is peribronchial thickening diffusely. Biapical scarring. Upper Abdomen: Imaging into the upper abdomen shows no acute findings. Musculoskeletal: Chest wall soft tissues are unremarkable. No acute bony abnormality. IMPRESSION: Small to moderate bilateral pleural effusions which have increased in size since prior chest CT. Right pleural effusion is loculated. Areas of atelectasis in the lower lobes bilaterally as well as right middle lobe and lingula. Peribronchial thickening compatible with bronchitis. Cardiomegaly. Electronically Signed   By: Rolm Baptise M.D.   On: 04/26/2019 21:54   DG Chest Bilateral Decubitus  Result Date: 04/30/2019 CLINICAL DATA:  Heart failure. EXAM: CHEST - BILATERAL DECUBITUS VIEW COMPARISON:  CT chest and chest x-ray dated April 26, 2019. FINDINGS: Bilateral decubitus x-rays demonstrate small layering pleural effusions. Unchanged cardiomegaly and interstitial edema. IMPRESSION: 1. Stable congestive heart failure with interstitial edema and small bilateral pleural effusions. Electronically Signed   By: Titus Dubin M.D.   On: 04/30/2019 10:18  DG Chest Port 1 View  Result Date: 04/26/2019 CLINICAL DATA:  86 year old female with confusion, "Hot flashes" which the patient has complained of when in atrial fibrillation previously. EXAM: PORTABLE CHEST 1 VIEW COMPARISON:  Portable chest 04/17/2019 and earlier. FINDINGS: Portable AP upright view at 1859 hours. Stable cardiomegaly and mediastinal  contours. Hazy and patchy opacity in the right lung which might appear similar to that on 04/05/2019 portable chest. But increased streaky right infrahilar opacity. Pulmonary vascularity appears decreased from 04/17/2019. No pneumothorax. Small bilateral pleural effusions seen by CTA on 04/06/2019 have been poorly visible radiographically. No confluent left lung opacity. Stable visualized osseous structures. Negative visible bowel gas pattern. IMPRESSION: 1. Nonspecific increased streaky right lung opacity since 04/17/2019. Favor increased atelectasis but acute infection is difficult to exclude. 2. Otherwise stable including cardiomegaly, small pleural effusions which were better demonstrated by CTA earlier this month. Electronically Signed   By: Genevie Ann M.D.   On: 04/26/2019 19:08   DG Chest Port 1 View  Result Date: 04/17/2019 CLINICAL DATA:  Weakness. Recent pneumonia. EXAM: PORTABLE CHEST 1 VIEW COMPARISON:  Radiograph 04/05/2019, CT 04/06/2019. Prior imaging at [facility]. FINDINGS: Cardiomegaly, similar to slightly progressed from prior. Bilateral pleural effusions with blunting of the costophrenic angles. Worsening interstitial opacities. No pneumothorax. Biapical pleuroparenchymal scarring. IMPRESSION: 1. Findings consistent with CHF. Slight worsening in pulmonary edema and cardiomegaly from prior. 2. Small pleural effusions are similar to prior on AP projection. Electronically Signed   By: Keith Rake M.D.   On: 04/17/2019 19:45    Orson Eva, DO  [facility] Pager [phone]  If 7PM-7AM, please contact night-coverage [email or web address] Password [email or web address] 04/30/2019, 5:50 PM   LOS: 3 days

## 2019-05-01 DIAGNOSIS — I4811 Longstanding persistent atrial fibrillation: Secondary | ICD-10-CM

## 2019-05-01 LAB — BASIC METABOLIC PANEL
Anion gap: 9 (ref 5–15)
BUN: 13 mg/dL (ref 8–23)
CO2: 26 mmol/L (ref 22–32)
Calcium: 8.5 mg/dL — ABNORMAL LOW (ref 8.9–10.3)
Chloride: 105 mmol/L (ref 98–111)
Creatinine, Ser: 0.98 mg/dL (ref 0.44–1.00)
GFR calc Af Amer: 60 mL/min — ABNORMAL LOW (ref >60)
GFR calc non Af Amer: 52 mL/min — ABNORMAL LOW (ref >60)
Glucose, Bld: 83 mg/dL (ref 70–99)
Potassium: 3.4 mmol/L — ABNORMAL LOW (ref 3.5–5.1)
Sodium: 140 mmol/L (ref 135–145)

## 2019-05-01 MED ORDER — METOPROLOL SUCCINATE ER 25 MG PO TB24
25.0000 mg | ORAL_TABLET | Freq: Every day | ORAL | Status: DC
  Administered 2019-05-01 – 2019-05-02 (×2): 25 mg via ORAL
  Filled 2019-05-01: qty 1

## 2019-05-01 NOTE — Plan of Care (Signed)

## 2019-05-01 NOTE — Progress Notes (Signed)
PROGRESS NOTE  Renee Blake [id number] DOB: [birth year] DOA: 04/26/2019 PCP: [name], MD  Brief History: 86 year old female with a history of permanent atrial fibrillation not on anticoagulation secondary to GI bleed, diastolic CHF, CKD stage III, endometrial carcinoma, and squamous cell carcinoma of the anal canal [facility] [place], [place] edema,and hot [place].She was recently admitted from 03/13/19 to 03/18/19 with acute on chronic dCHF. Her d/c weight was 112.8 lbs, and she was d/ced with lasix 40 mg daily. Initial labs showed WBC 6.3, Hgb 8.6, platelets 232, Na+ 140, K+ 3.4 and creatinine 1.14. Mg 1.9. BNP 947. COVID negative. CXR showed increased streaky right lung opacity thought to be most consistent with atelectasis but infection could not be ruled-out. Chest CT showed small to moderate bilateral pleural effusions, areas of atelectasis and peribronchial thickening compatible with bronchitis. EKG shows atrial fibrillation, HR 80 with RBBB and LAFB.   She was admitted for an acute CHF exacerbation and has been started on IV Lasix 20mg .Strict I&O's have not been recorded but she had 4 urine occurrences yesterday. Weight listed as 125 lbs on admission, at 123 lbs today.  Assessment/Plan: Acute respiratory failure with hypoxia -Secondary to exacerbation of diastolic CHF -Patient was on 3L -now weaned to RA  Acute on chronic diastolic CHF -appreciate cardiology follow up -continuemetoprolol succinate -Continue IV lasix to bid -case discussed with cardiology, Dr. Bronson Ing -personally reviewed CXR--increased interstitial marking -01/19/19 Echo EF 60-65%, G2DD, moderate elevated PASP -12/17/20discharge weight 112.8 lbs -05/01/19 standing weight 119.2  Permanent Atrial Fibrillation -continue diltiazemSR and metoprolol succinate -amiodarone stopped during December 2020 admission -not on [facility] due to  GIB,duodenal ulcers, anemia -rate controlled  Nausea/hot flash/FTT -Post ingestion of morning meds developing nausea, hot flashes, but no vomiting -evaluated medication and dividedthe doses -As needed antiemetics -No signs of infection -History of GI bleed,>> H&H remained stable -repeat Hemoccult--neg -CEA normal at 4.4 -03/18/19 TSH 13.447 -03/18/19 Free T4--1.07 -check B12--150 on 03/18/19 -folate 13.4 -tolerating diet presently  CKD stage 3b -baseline creatinine 1.2-1.4 -monitor with diuresis -am BMP  History ofSCCanorectal cancer/endometrial carcinoma/ -Hemoccult--neg -Stable -CEA normal at 4.4 -patient followed by radoncology, GYN and medical oncology team from [facility] by [place]. No recent follow-up -12/16 CT abd/pelvis--no acute findings, no mets, nonobstructine R-renal calculi -Last Colonoscopy:April 2019-new findings include nodular hard mass anterior anal orifice, DRE revealed 5 cm hard rectal masscircumferential anterior bowel wall, 6 mm polyp distal sigmoid. Path w/rectum distal lesion biopsy-at least high-grade squamous intraepithelial lesion suspicious for invasion, sigmoid polyp inflammatory  Goals of Care -Advance care planning, including the explanation and discussion of advance directives was carried out with the patient and family. Code status including explanations of "Full Code" and "DNR" and alternatives were discussed in detail. Discussion of end-of-life issues including but not limited palliative care, hospice care and the concept of hospice, other end-of-life care options, power of attorney for health care decisions, living wills, and physician orders for life-sustaining treatment were also discussed with the patient and family. Total face to face time 16 minutes. -consult palliative med     Disposition Plan:Home with cleared by cardiology--refuses SNF--pt remains fluid overloaded Family Communication:Daughter updatedon phone  1/29  Consultants:cardiology  Code Status: DNR  DVT Prophylaxis: [place] Lovenox   Procedures: As Listed in Progress Note Above  Antibiotics: None    Subjective: Patient denies fevers, chills, headache, chest pain, dyspnea, nausea, vomiting, diarrhea, abdominal pain, dysuria, hematuria, hematochezia,  and melena.   Objective: Vitals:   04/30/19 0615 04/30/19 1604 04/30/19 2119 05/01/19 0613  BP: 113/71 122/63 118/73 100/63  Pulse: 70 74 80 79  Resp: 16 18 18 18   Temp: 98.2 F (36.8 C) 98.1 F (36.7 C) 98.2 F (36.8 C) 98.3 F (36.8 C)  TempSrc:   Oral Oral  SpO2: 96% 100% 94% 94%  Weight:      Height:        Intake/Output Summary (Last 24 hours) at 05/01/2019 1507 Last data filed at 05/01/2019 1300 Gross per 24 hour  Intake 480 ml  Output 300 ml  Net 180 ml   Weight change:  Exam:   General:  Pt is alert, follows commands appropriately, not in acute distress  HEENT: No icterus, No thrush, No neck mass, [place]/AT  Cardiovascular: RRR, S1/S2, no rubs, no gallops  Respiratory: bibasilar rales. No wheeze  Abdomen: Soft/+BS, non tender, non distended, no guarding  Extremities: No edema, No lymphangitis, No petechiae, No rashes, no synovitis   Data Reviewed: I have personally reviewed following labs and imaging studies Basic Metabolic Panel: Recent Labs  Lab 04/26/19 1947 04/28/19 0445 04/29/19 0440 04/30/19 0629 05/01/19 0638  NA 134* 138 138 141 140  K 4.7 3.5 3.3* 3.5 3.4*  CL 103 106 105 106 105  CO2 22 24 25 24 26   GLUCOSE 112* 82 79 92 83  BUN 26* 17 17 13 13   CREATININE 1.26* 1.01* 1.09* 0.93 0.98  CALCIUM 8.3* 8.5* 8.4* 8.8* 8.5*  MG  --   --   --  2.1  --    Liver Function Tests: Recent Labs  Lab 04/26/19 1947  AST 48*  ALT 82*  ALKPHOS 100  BILITOT 0.2*  PROT 7.0  ALBUMIN 3.4*   No results for input(s): LIPASE, AMYLASE in the last 168 hours. No results for input(s): AMMONIA in the last 168 hours. Coagulation Profile: No  results for input(s): INR, PROTIME in the last 168 hours. CBC: Recent Labs  Lab 04/26/19 1947  WBC 10.0  NEUTROABS 7.4  HGB 10.3*  HCT 35.8*  MCV 97.3  PLT 292   Cardiac Enzymes: No results for input(s): CKTOTAL, CKMB, CKMBINDEX, TROPONINI in the last 168 hours. BNP: Invalid input(s): POCBNP CBG: No results for input(s): GLUCAP in the last 168 hours. HbA1C: No results for input(s): HGBA1C in the last 72 hours. Urine analysis:    Component Value Date/Time   COLORURINE YELLOW 01/27/2019 1533   APPEARANCEUR CLOUDY (A) 01/27/2019 1533   LABSPEC 1.005 01/27/2019 1533   PHURINE 7.0 01/27/2019 1533   GLUCOSEU NEGATIVE 01/27/2019 1533   HGBUR SMALL (A) 01/27/2019 1533   BILIRUBINUR NEGATIVE 01/27/2019 1533   [place] 01/27/2019 1533   PROTEINUR NEGATIVE 01/27/2019 1533   UROBILINOGEN 0.2 08/22/2014 1300   NITRITE NEGATIVE 01/27/2019 1533   LEUKOCYTESUR LARGE (A) 01/27/2019 1533   Sepsis Labs: @LABRCNTIP (procalcitonin:4,lacticidven:4) ) Recent Results (from the past 240 hour(s))  SARS CORONAVIRUS 2 (Amra Shukla 6-24 HRS) Nasopharyngeal Nasopharyngeal Swab     Status: None   Collection Time: 04/26/19 10:52 PM   Specimen: Nasopharyngeal Swab  Result Value Ref Range Status   SARS Coronavirus 2 NEGATIVE NEGATIVE Final    Comment: (NOTE) SARS-CoV-2 target nucleic acids are NOT DETECTED. The SARS-CoV-2 RNA is generally detectable in upper and lower respiratory specimens during the acute phase of infection. Negative results do not preclude SARS-CoV-2 infection, do not rule out co-infections with other pathogens, and should not be used as the sole  basis for treatment or other patient management decisions. Negative results must be combined with clinical observations, patient history, and epidemiological information. The expected result is Negative. Fact Sheet for Patients: SugarRoll.be Fact Sheet for Healthcare  Providers: https://www.woods-mathews.com/ This test is not yet approved or cleared by the [place] FDA and  has been authorized for detection and/or diagnosis of SARS-CoV-2 by FDA under an Emergency Use Authorization (EUA). This EUA will remain  in effect (meaning this test can be used) for the duration of the COVID-19 declaration under Section 56 4(b)(1) of the Act, 21 U.S.C. section 360bbb-3(b)(1), unless the authorization is terminated or revoked sooner. Performed at [facility], [place] [address] [place], [place] [address]   MRSA PCR Screening     Status: None   Collection Time: 04/27/19  1:25 AM   Specimen: Nasal Mucosa; Nasopharyngeal  Result Value Ref Range Status   MRSA by PCR NEGATIVE NEGATIVE Final    Comment:        The GeneXpert MRSA Assay (FDA approved for NASAL specimens only), is one component of a comprehensive MRSA colonization surveillance program. It is not intended to diagnose MRSA infection nor to guide or monitor treatment for MRSA infections. Performed at [facility], [address], [place], [place] [address]      Scheduled Meds:  diltiazem  90 mg Oral BID   enoxaparin (LOVENOX) injection  40 mg Subcutaneous Q24H   feeding supplement  1 Container Oral BID BM   furosemide  40 mg Intravenous BID   levothyroxine  25 mcg Oral QAC breakfast   metoprolol succinate  25 mg Oral Daily   multivitamin with minerals  1 tablet Oral Daily   potassium chloride  20 mEq Oral Daily   pregabalin  25 mg Oral BID   vitamin B-12  500 mcg Oral Daily   Continuous Infusions:  Procedures/Studies: CT Chest W Contrast  Result Date: 04/26/2019 CLINICAL DATA:  Pneumonia, effusions suspected. EXAM: CT CHEST WITH CONTRAST TECHNIQUE: Multidetector CT imaging of the chest was performed during intravenous contrast administration. CONTRAST:  68mL OMNIPAQUE IOHEXOL 300 MG/ML  SOLN COMPARISON:  Chest x-ray today.  Chest CT 04/06/2019 FINDINGS:  Cardiovascular: Heart is enlarged. Aorta normal caliber with scattered calcifications. No filling defects in the pulmonary arteries to suggest pulmonary emboli. Mediastinum/Nodes: No mediastinal, hilar, or axillary adenopathy. Scattered small mediastinal lymph nodes. Lungs/Pleura: Enlarging bilateral pleural effusions since prior chest CT. Right pleural effusion is loculated. Compressive atelectasis in the left lower lobe. Bandlike opacity in the right lower lobe, lingula and right middle lobe, likely areas of atelectasis. There is peribronchial thickening diffusely. Biapical scarring. Upper Abdomen: Imaging into the upper abdomen shows no acute findings. Musculoskeletal: Chest wall soft tissues are unremarkable. No acute bony abnormality. IMPRESSION: Small to moderate bilateral pleural effusions which have increased in size since prior chest CT. Right pleural effusion is loculated. Areas of atelectasis in the lower lobes bilaterally as well as right middle lobe and lingula. Peribronchial thickening compatible with bronchitis. Cardiomegaly. Electronically Signed   By: Rolm Baptise M.D.   On: 04/26/2019 21:54   DG Chest Bilateral Decubitus  Result Date: 04/30/2019 CLINICAL DATA:  Heart failure. EXAM: CHEST - BILATERAL DECUBITUS VIEW COMPARISON:  CT chest and chest x-ray dated April 26, 2019. FINDINGS: Bilateral decubitus x-rays demonstrate small layering pleural effusions. Unchanged cardiomegaly and interstitial edema. IMPRESSION: 1. Stable congestive heart failure with interstitial edema and small bilateral pleural effusions. Electronically Signed   By: Orville Govern.D.  On: 04/30/2019 10:18   DG Chest Port 1 View  Result Date: 04/26/2019 CLINICAL DATA:  86 year old female with confusion, "Hot flashes" which the patient has complained of when in atrial fibrillation previously. EXAM: PORTABLE CHEST 1 VIEW COMPARISON:  Portable chest 04/17/2019 and earlier. FINDINGS: Portable AP upright view at 1859  hours. Stable cardiomegaly and mediastinal contours. Hazy and patchy opacity in the right lung which might appear similar to that on 04/05/2019 portable chest. But increased streaky right infrahilar opacity. Pulmonary vascularity appears decreased from 04/17/2019. No pneumothorax. Small bilateral pleural effusions seen by CTA on 04/06/2019 have been poorly visible radiographically. No confluent left lung opacity. Stable visualized osseous structures. Negative visible bowel gas pattern. IMPRESSION: 1. Nonspecific increased streaky right lung opacity since 04/17/2019. Favor increased atelectasis but acute infection is difficult to exclude. 2. Otherwise stable including cardiomegaly, small pleural effusions which were better demonstrated by CTA earlier this month. Electronically Signed   By: Genevie Ann M.D.   On: 04/26/2019 19:08   DG Chest Port 1 View  Result Date: 04/17/2019 CLINICAL DATA:  Weakness. Recent pneumonia. EXAM: PORTABLE CHEST 1 VIEW COMPARISON:  Radiograph 04/05/2019, CT 04/06/2019. Prior imaging at [facility]. FINDINGS: Cardiomegaly, similar to slightly progressed from prior. Bilateral pleural effusions with blunting of the costophrenic angles. Worsening interstitial opacities. No pneumothorax. Biapical pleuroparenchymal scarring. IMPRESSION: 1. Findings consistent with CHF. Slight worsening in pulmonary edema and cardiomegaly from prior. 2. Small pleural effusions are similar to prior on AP projection. Electronically Signed   By: Keith Rake M.D.   On: 04/17/2019 19:45    Orson Eva, DO  [facility] Pager [phone]  If 7PM-7AM, please contact night-coverage [email or web address] Password [email or web address] 05/01/2019, 3:07 PM   LOS: 4 days

## 2019-05-02 ENCOUNTER — Inpatient Hospital Stay (HOSPITAL_COMMUNITY): Payer: Medicare Other

## 2019-05-02 LAB — BASIC METABOLIC PANEL
Anion gap: 12 (ref 5–15)
BUN: 15 mg/dL (ref 8–23)
CO2: 26 mmol/L (ref 22–32)
Calcium: 8.5 mg/dL — ABNORMAL LOW (ref 8.9–10.3)
Chloride: 103 mmol/L (ref 98–111)
Creatinine, Ser: 1.02 mg/dL — ABNORMAL HIGH (ref 0.44–1.00)
GFR calc Af Amer: 57 mL/min — ABNORMAL LOW (ref >60)
GFR calc non Af Amer: 49 mL/min — ABNORMAL LOW (ref >60)
Glucose, Bld: 82 mg/dL (ref 70–99)
Potassium: 3 mmol/L — ABNORMAL LOW (ref 3.5–5.1)
Sodium: 141 mmol/L (ref 135–145)

## 2019-05-02 LAB — MAGNESIUM: Magnesium: 1.9 mg/dL (ref 1.7–2.4)

## 2019-05-02 MED ORDER — POTASSIUM CHLORIDE CRYS ER 20 MEQ PO TBCR
40.0000 meq | EXTENDED_RELEASE_TABLET | Freq: Once | ORAL | Status: AC
  Administered 2019-05-02: 40 meq via ORAL
  Filled 2019-05-02: qty 2

## 2019-05-02 MED ORDER — METOPROLOL SUCCINATE ER 50 MG PO TB24
50.0000 mg | ORAL_TABLET | Freq: Every day | ORAL | Status: DC
  Administered 2019-05-03 – 2019-05-04 (×2): 50 mg via ORAL
  Filled 2019-05-02 (×2): qty 1

## 2019-05-02 NOTE — Plan of Care (Signed)

## 2019-05-02 NOTE — Progress Notes (Addendum)
PROGRESS NOTE  Renee Blake [id number] DOB: Jun 13, 1928 DOA: 04/26/2019 PCP: [name], MD  Brief History: 86 year old female with a history of permanent atrial fibrillation not on anticoagulation secondary to GI bleed, diastolic CHF, CKD stage III, endometrial carcinoma, and squamous cell carcinoma of the anal canal [facility], [facility], [facility] edema,and hot flashes.She was recently admitted from 03/13/19 to 03/18/19 with acute on chronic dCHF. Her d/c weight was 112.8 lbs, and she was d/ced with lasix 40 mg daily. Initial labs showed WBC 6.3, Hgb 8.6, platelets 232, Na+ 140, K+ 3.4 and creatinine 1.14. Mg 1.9. BNP 947. COVID negative. CXR showed increased streaky right lung opacity thought to be most consistent with atelectasis but infection could not be ruled-out. Chest CT showed small to moderate bilateral pleural effusions, areas of atelectasis and peribronchial thickening compatible with bronchitis. EKG shows atrial fibrillation, HR 80 with RBBB and LAFB.   She was admitted for an acute CHF exacerbation and has been started on IV Lasix 20mg .Strict I&O's have not been recorded but she had 4 urine occurrences yesterday. Weight listed as 125 lbs on admission.  Assessment/Plan: Acute respiratory failure with hypoxia -Secondary to exacerbation of diastolic CHF -Patient wason 3L -now weaned to RA  Acute on chronic diastolic CHF -appreciate cardiology follow up -continuemetoprolol succinate -Continue IV lasix to bid -personally reviewed CXR--increased interstitial marking -01/19/19 Echo EF 60-65%, G2DD, moderate elevated PASP -12/17/20discharge weight 112.8 lbs -05/01/19 standing weight 119.2 -05/02/19 standing weight 117.0 -repeat decubitus xray on 05/03/19  Permanent Atrial Fibrillation -continue diltiazemSR and metoprolol succinate -amiodarone stopped during December 2020 admission -not on AC due to  GIB,duodenal ulcers, anemia -rate controlled  Nausea/hot flash/FTT -Post ingestion of morning meds developing nausea, hot flashes, but no vomiting -evaluatedmedication and dividedthe doses -As needed antiemetics -No signs of infection -History of GI bleed,>> H&H remained stable -repeat Hemoccult--neg -CEA normal at 4.4 -03/18/19 TSH 13.447 -03/18/19 Free T4--1.07 -check B12--150 on 03/18/19-->repleting -folate 13.4 -tolerating diet presently  CKD stage 3b -baseline creatinine 1.2-1.4 -monitor with diuresis -am BMP  History ofSCCanorectal cancer/endometrial carcinoma/ -Hemoccult--neg -Stable -CEA normal at 4.4 -patient followed by radoncology, GYN and medical oncology team from [facility] [place]. No recent follow-up -03/17/19 CT abd/pelvis--no acute findings, no mets, nonobstructine R-renal calculi -Last Colonoscopy:April 2019-new findings include nodular hard mass anterior anal orifice, DRE revealed 5 cm hard rectal masscircumferential anterior bowel wall, 6 mm polyp distal sigmoid. Path w/rectum distal lesion biopsy-at least high-grade squamous intraepithelial lesion suspicious for invasion, sigmoid polyp inflammatory  Hypokalemia -replete  Goals of Care -Advance care planning, including the explanation and discussion of advance directives was carried out with the patient and family. Code status including explanations of "Full Code" and "DNR" and alternatives were discussed in detail. Discussion of end-of-life issues including but not limited palliative care, hospice care and the concept of hospice, other end-of-life care options, power of attorney for health care decisions, living wills, and physician orders for life-sustaining treatment were also discussed with the patient and family. Total face to face time 16 minutes. -consult palliative med     Disposition Plan:Home with cleared by cardiology--refuses SNF--pt remains fluid overloaded Family  Communication:Daughter updatedon phone1/29  Consultants:cardiology  Code Status: DNR  DVT Prophylaxis: [place] Lovenox   Procedures: As Listed in Progress Note Above  Antibiotics: None     Subjective: Patient denies fevers, chills, headache, chest pain, dyspnea, nausea, vomiting, diarrhea, abdominal pain, dysuria, hematuria, hematochezia,  and melena.   Objective: Vitals:   05/01/19 1546 05/01/19 2113 05/02/19 0515 05/02/19 1358  BP: (!) 144/79 110/65 106/60 114/70  Pulse: (!) 108 88 86 93  Resp: 16 16 16    Temp:  98.6 F (37 C) 97.9 F (36.6 C) 98.2 F (36.8 C)  TempSrc:  Oral Oral Oral  SpO2: 100% 95% 94% 99%  Weight:   52.1 kg   Height:        Intake/Output Summary (Last 24 hours) at 05/02/2019 1433 Last data filed at 05/02/2019 1243 Gross per 24 hour  Intake 720 ml  Output --  Net 720 ml   Weight change:  Exam:   General:  Pt is alert, follows commands appropriately, not in acute distress  HEENT: No icterus, No thrush, No neck mass, [place]/AT  Cardiovascular: IRRR, S1/S2, no rubs, no gallops  Respiratory: bibasilar crackles. No wheeze  Abdomen: Soft/+BS, non tender, non distended, no guarding  Extremities: No edema, No lymphangitis, No petechiae, No rashes, no synovitis   Data Reviewed: I have personally reviewed following labs and imaging studies Basic Metabolic Panel: Recent Labs  Lab 04/28/19 0445 04/29/19 0440 04/30/19 0629 05/01/19 0638 05/02/19 0625  NA 138 138 141 140 141  K 3.5 3.3* 3.5 3.4* 3.0*  CL 106 105 106 105 103  CO2 24 25 24 26 26   GLUCOSE 82 79 92 83 82  BUN 17 17 13 13 15   CREATININE 1.01* 1.09* 0.93 0.98 1.02*  CALCIUM 8.5* 8.4* 8.8* 8.5* 8.5*  MG  --   --  2.1  --  1.9   Liver Function Tests: Recent Labs  Lab 04/26/19 1947  AST 48*  ALT 82*  ALKPHOS 100  BILITOT 0.2*  PROT 7.0  ALBUMIN 3.4*   No results for input(s): LIPASE, AMYLASE in the last 168 hours. No results for input(s): AMMONIA in the  last 168 hours. Coagulation Profile: No results for input(s): INR, PROTIME in the last 168 hours. CBC: Recent Labs  Lab 04/26/19 1947  WBC 10.0  NEUTROABS 7.4  HGB 10.3*  HCT 35.8*  MCV 97.3  PLT 292   Cardiac Enzymes: No results for input(s): CKTOTAL, CKMB, CKMBINDEX, TROPONINI in the last 168 hours. BNP: Invalid input(s): POCBNP CBG: No results for input(s): GLUCAP in the last 168 hours. HbA1C: No results for input(s): HGBA1C in the last 72 hours. Urine analysis:    Component Value Date/Time   COLORURINE YELLOW 01/27/2019 1533   APPEARANCEUR CLOUDY (A) 01/27/2019 1533   LABSPEC 1.005 01/27/2019 1533   PHURINE 7.0 01/27/2019 1533   GLUCOSEU NEGATIVE 01/27/2019 1533   HGBUR SMALL (A) 01/27/2019 1533   BILIRUBINUR NEGATIVE 01/27/2019 1533   [place] 01/27/2019 1533   PROTEINUR NEGATIVE 01/27/2019 1533   UROBILINOGEN 0.2 08/22/2014 1300   NITRITE NEGATIVE 01/27/2019 1533   LEUKOCYTESUR LARGE (A) 01/27/2019 1533   Sepsis Labs: @LABRCNTIP (procalcitonin:4,lacticidven:4) ) Recent Results (from the past 240 hour(s))  SARS CORONAVIRUS 2 ([place] 6-24 HRS) Nasopharyngeal Nasopharyngeal Swab     Status: None   Collection Time: 04/26/19 10:52 PM   Specimen: Nasopharyngeal Swab  Result Value Ref Range Status   SARS Coronavirus 2 NEGATIVE NEGATIVE Final    Comment: (NOTE) SARS-CoV-2 target nucleic acids are NOT DETECTED. The SARS-CoV-2 RNA is generally detectable in upper and lower respiratory specimens during the acute phase of infection. Negative results do not preclude SARS-CoV-2 infection, do not rule out co-infections with other pathogens, and should not be used as the sole basis for treatment  or other patient management decisions. Negative results must be combined with clinical observations, patient history, and epidemiological information. The expected result is Negative. Fact Sheet for Patients: SugarRoll.be Fact Sheet for  Healthcare Providers: https://www.woods-mathews.com/ This test is not yet approved or cleared by the [place] FDA and  has been authorized for detection and/or diagnosis of SARS-CoV-2 by FDA under an Emergency Use Authorization (EUA). This EUA will remain  in effect (meaning this test can be used) for the duration of the COVID-19 declaration under Section 56 4(b)(1) of the Act, 21 U.S.C. section 360bbb-3(b)(1), unless the authorization is terminated or revoked sooner. Performed at [facility], [place] [address], [place], [place] [address]   MRSA PCR Screening     Status: None   Collection Time: 04/27/19  1:25 AM   Specimen: Nasal Mucosa; Nasopharyngeal  Result Value Ref Range Status   MRSA by PCR NEGATIVE NEGATIVE Final    Comment:        The GeneXpert MRSA Assay (FDA approved for NASAL specimens only), is one component of a comprehensive MRSA colonization surveillance program. It is not intended to diagnose MRSA infection nor to guide or monitor treatment for MRSA infections. Performed at [facility], [address], [place], [place] [address]      Scheduled Meds: . diltiazem  90 mg Oral BID  . enoxaparin (LOVENOX) injection  40 mg Subcutaneous Q24H  . feeding supplement  1 Container Oral BID BM  . furosemide  40 mg Intravenous BID  . levothyroxine  25 mcg Oral QAC breakfast  . [START ON 05/03/2019] metoprolol succinate  50 mg Oral Daily  . multivitamin with minerals  1 tablet Oral Daily  . potassium chloride  20 mEq Oral Daily  . potassium chloride  40 mEq Oral Once  . pregabalin  25 mg Oral BID  . vitamin B-12  500 mcg Oral Daily   Continuous Infusions:  Procedures/Studies: CT Chest W Contrast  Result Date: 04/26/2019 CLINICAL DATA:  Pneumonia, effusions suspected. EXAM: CT CHEST WITH CONTRAST TECHNIQUE: Multidetector CT imaging of the chest was performed during intravenous contrast administration. CONTRAST:  31mL OMNIPAQUE IOHEXOL 300 MG/ML  SOLN  COMPARISON:  Chest x-ray today.  Chest CT 04/06/2019 FINDINGS: Cardiovascular: Heart is enlarged. Aorta normal caliber with scattered calcifications. No filling defects in the pulmonary arteries to suggest pulmonary emboli. Mediastinum/Nodes: No mediastinal, hilar, or axillary adenopathy. Scattered small mediastinal lymph nodes. Lungs/Pleura: Enlarging bilateral pleural effusions since prior chest CT. Right pleural effusion is loculated. Compressive atelectasis in the left lower lobe. Bandlike opacity in the right lower lobe, lingula and right middle lobe, likely areas of atelectasis. There is peribronchial thickening diffusely. Biapical scarring. Upper Abdomen: Imaging into the upper abdomen shows no acute findings. Musculoskeletal: Chest wall soft tissues are unremarkable. No acute bony abnormality. IMPRESSION: Small to moderate bilateral pleural effusions which have increased in size since prior chest CT. Right pleural effusion is loculated. Areas of atelectasis in the lower lobes bilaterally as well as right middle lobe and lingula. Peribronchial thickening compatible with bronchitis. Cardiomegaly. Electronically Signed   By: Rolm Baptise M.D.   On: 04/26/2019 21:54   DG Chest Bilateral Decubitus  Result Date: 04/30/2019 CLINICAL DATA:  Heart failure. EXAM: CHEST - BILATERAL DECUBITUS VIEW COMPARISON:  CT chest and chest x-ray dated April 26, 2019. FINDINGS: Bilateral decubitus x-rays demonstrate small layering pleural effusions. Unchanged cardiomegaly and interstitial edema. IMPRESSION: 1. Stable congestive heart failure with interstitial edema and small bilateral pleural effusions. Electronically  Signed   By: Titus Dubin M.D.   On: 04/30/2019 10:18   DG Chest Port 1 View  Result Date: 04/26/2019 CLINICAL DATA:  86 year old female with confusion, "Hot flashes" which the patient has complained of when in atrial fibrillation previously. EXAM: PORTABLE CHEST 1 VIEW COMPARISON:  Portable chest  04/17/2019 and earlier. FINDINGS: Portable AP upright view at 1859 hours. Stable cardiomegaly and mediastinal contours. Hazy and patchy opacity in the right lung which might appear similar to that on 04/05/2019 portable chest. But increased streaky right infrahilar opacity. Pulmonary vascularity appears decreased from 04/17/2019. No pneumothorax. Small bilateral pleural effusions seen by CTA on 04/06/2019 have been poorly visible radiographically. No confluent left lung opacity. Stable visualized osseous structures. Negative visible bowel gas pattern. IMPRESSION: 1. Nonspecific increased streaky right lung opacity since 04/17/2019. Favor increased atelectasis but acute infection is difficult to exclude. 2. Otherwise stable including cardiomegaly, small pleural effusions which were better demonstrated by CTA earlier this month. Electronically Signed   By: Genevie Ann M.D.   On: 04/26/2019 19:08   DG Chest Port 1 View  Result Date: 04/17/2019 CLINICAL DATA:  Weakness. Recent pneumonia. EXAM: PORTABLE CHEST 1 VIEW COMPARISON:  Radiograph 04/05/2019, CT 04/06/2019. Prior imaging at [facility]. FINDINGS: Cardiomegaly, similar to slightly progressed from prior. Bilateral pleural effusions with blunting of the costophrenic angles. Worsening interstitial opacities. No pneumothorax. Biapical pleuroparenchymal scarring. IMPRESSION: 1. Findings consistent with CHF. Slight worsening in pulmonary edema and cardiomegaly from prior. 2. Small pleural effusions are similar to prior on AP projection. Electronically Signed   By: Keith Rake M.D.   On: 04/17/2019 19:45    Orson Eva, DO  [facility] Pager [phone]  If 7PM-7AM, please contact night-coverage [email or web address] Password [facility] 05/02/2019, 2:33 PM   LOS: 5 days

## 2019-05-03 ENCOUNTER — Inpatient Hospital Stay (HOSPITAL_COMMUNITY): Payer: Medicare Other

## 2019-05-03 DIAGNOSIS — J810 Acute pulmonary edema: Secondary | ICD-10-CM

## 2019-05-03 LAB — BASIC METABOLIC PANEL
Anion gap: 8 (ref 5–15)
BUN: 18 mg/dL (ref 8–23)
CO2: 28 mmol/L (ref 22–32)
Calcium: 9.1 mg/dL (ref 8.9–10.3)
Chloride: 107 mmol/L (ref 98–111)
Creatinine, Ser: 1.14 mg/dL — ABNORMAL HIGH (ref 0.44–1.00)
GFR calc Af Amer: 50 mL/min — ABNORMAL LOW (ref >60)
GFR calc non Af Amer: 43 mL/min — ABNORMAL LOW (ref >60)
Glucose, Bld: 90 mg/dL (ref 70–99)
Potassium: 4.2 mmol/L (ref 3.5–5.1)
Sodium: 143 mmol/L (ref 135–145)

## 2019-05-03 LAB — MAGNESIUM: Magnesium: 2.1 mg/dL (ref 1.7–2.4)

## 2019-05-03 IMAGING — DX DG CHEST DECUBITUS BILAT
2 series · 2 of 2 positions shown · non-contrast
Comparison: none

CLINICAL DATA: Pt c/o intermittent SOB. Pleural effusion AND
Pulmonary edema. COVID negative [DATE]. HX HTN, A-fib, nonsmoker

EXAM:
CHEST - BILATERAL DECUBITUS VIEW

[chest decu (1 of 2)]
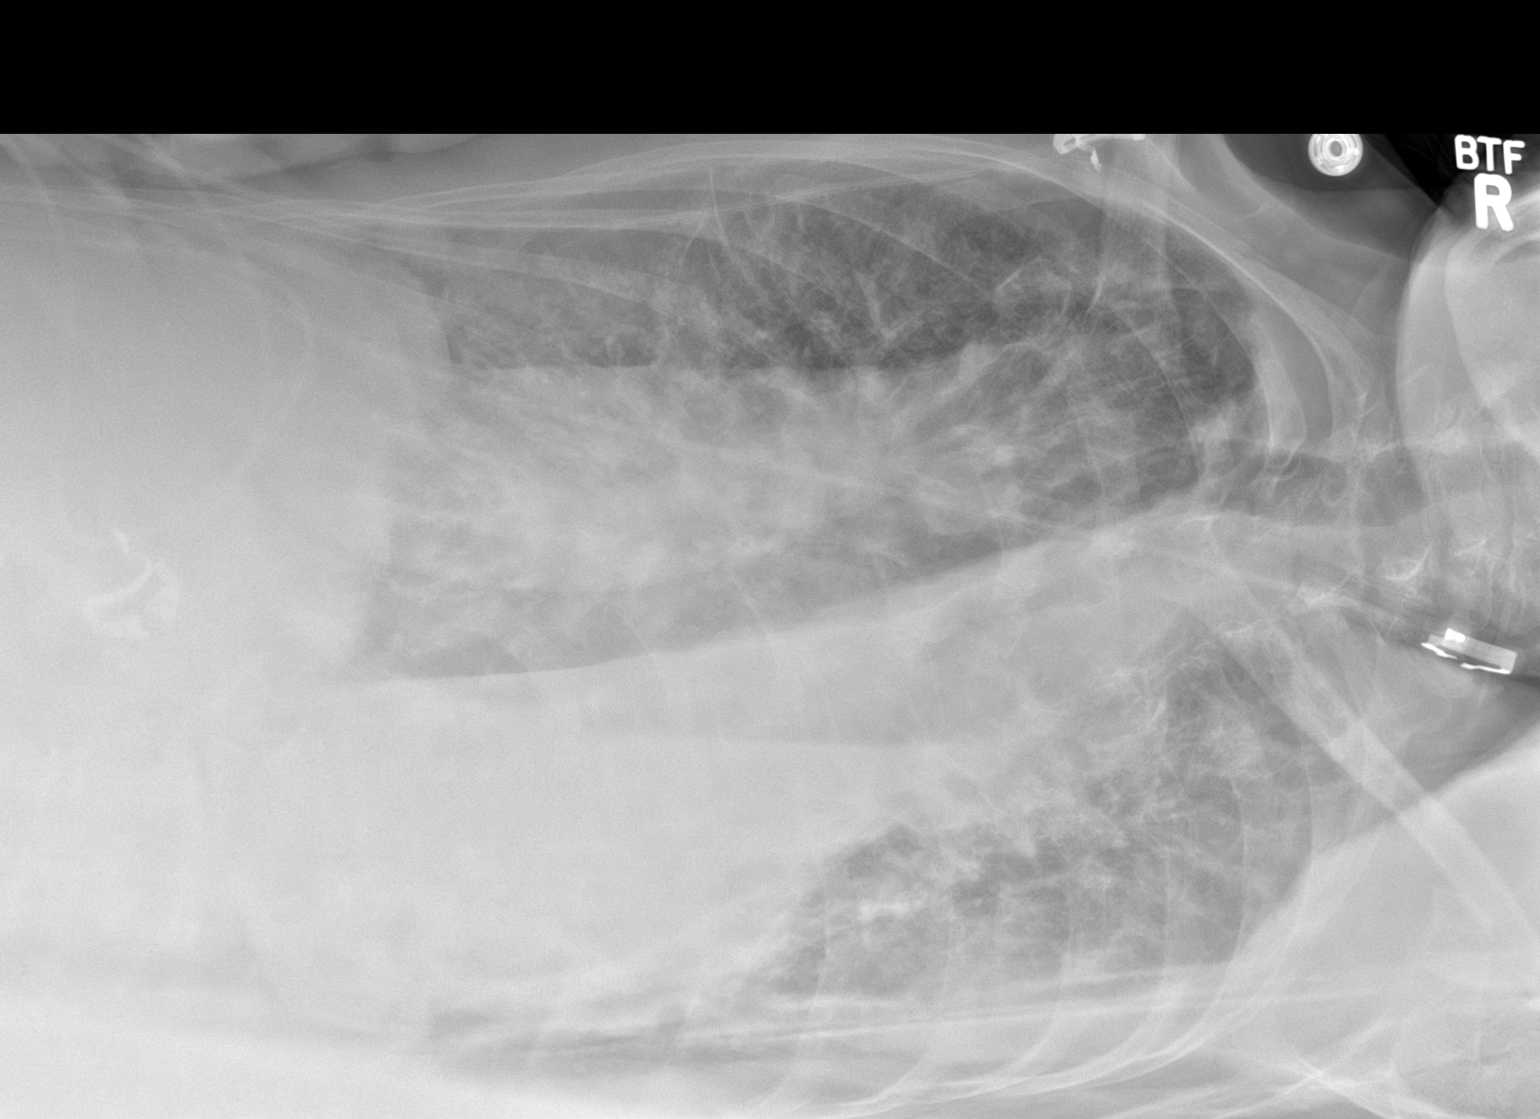

[chest decu (2 of 2)]
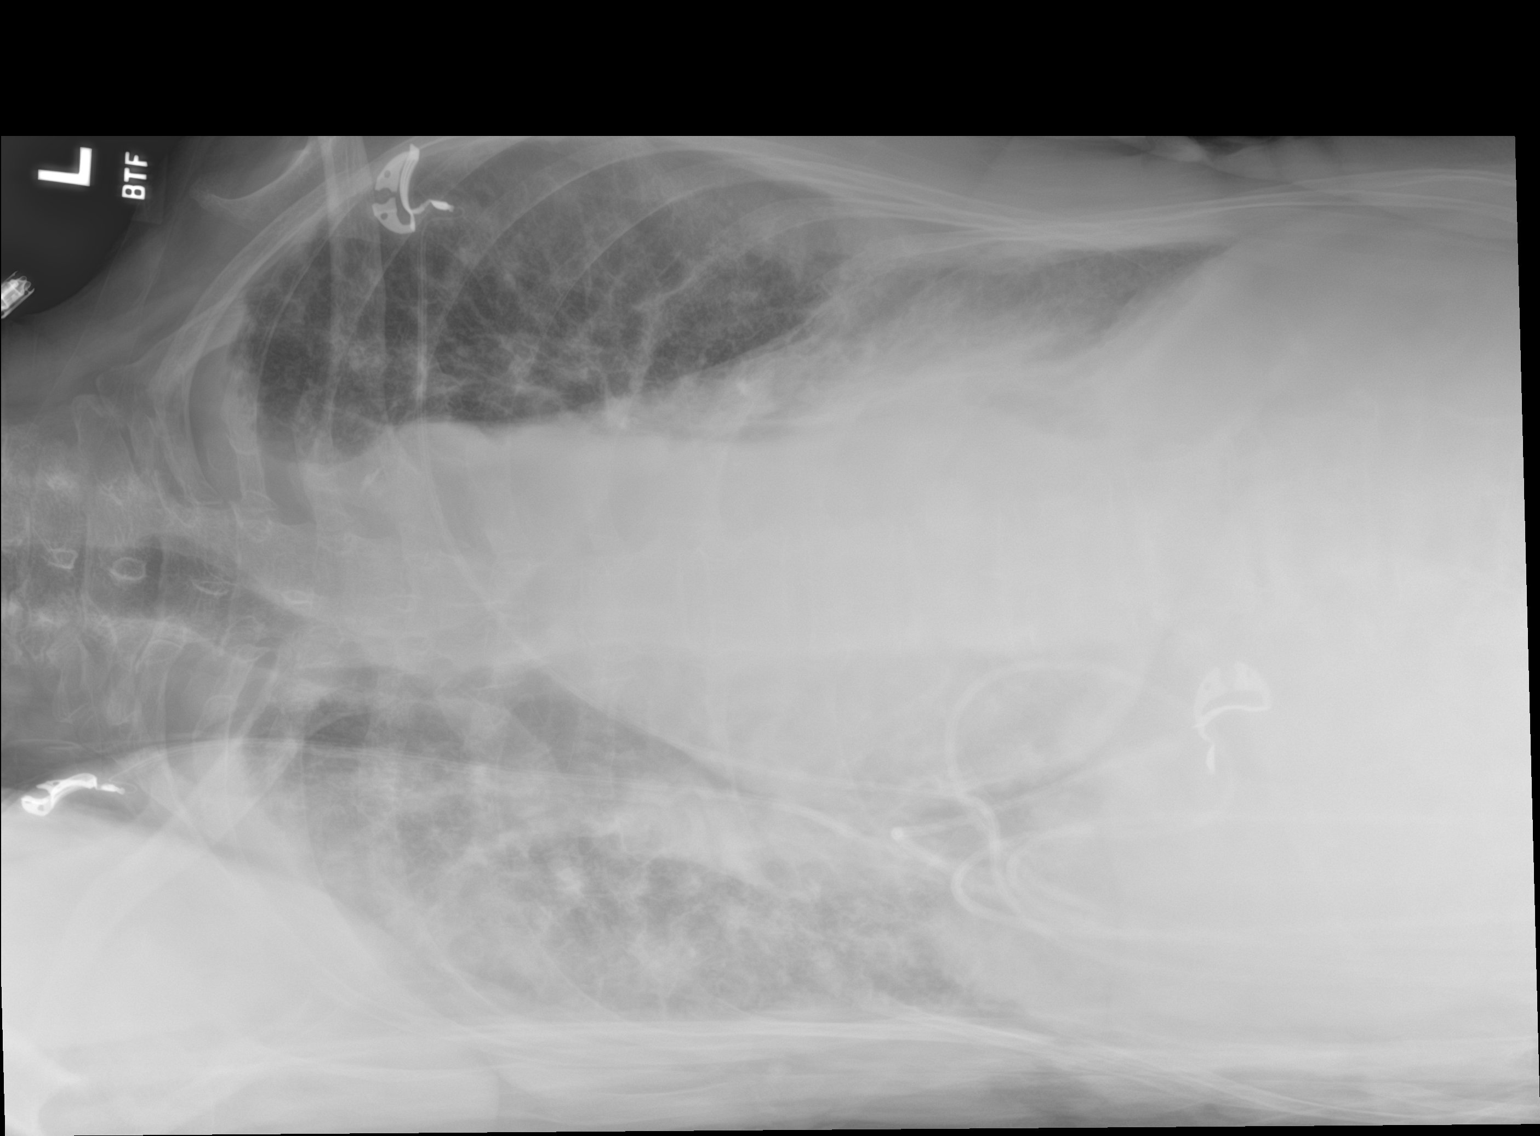

[2 of 2 positions shown; findings below may reference images not displayed]

FINDINGS: Layering moderate left pleural effusion. Smaller layering component
of right effusion. Mild interstitial edema or infiltrates marginally
increased. Heart size upper limits normal for technique. Aortic
Atherosclerosis ([PR]-170.0).

No pneumothorax.
IMPRESSION: Moderate left and small right pleural effusions.

## 2019-05-03 MED ORDER — METOLAZONE 5 MG PO TABS
2.5000 mg | ORAL_TABLET | Freq: Once | ORAL | Status: AC
  Administered 2019-05-03: 2.5 mg via ORAL
  Filled 2019-05-03: qty 1

## 2019-05-03 MED ORDER — ENOXAPARIN SODIUM 30 MG/0.3ML ~~LOC~~ SOLN
30.0000 mg | SUBCUTANEOUS | Status: DC
  Administered 2019-05-03 – 2019-05-04 (×2): 30 mg via SUBCUTANEOUS
  Filled 2019-05-03 (×2): qty 0.3

## 2019-05-03 NOTE — TOC Progression Note (Addendum)
Transition of Care [facility]) - Progression Note    Patient Details  Name: Renee Blake MRN: [id number] Date of Birth: [birth year]  Transition of Care [facility]) CM/SW Contact  Shade Flood, LCSW Phone Number: 05/03/2019, 2:48 PM  Clinical Narrative:     TOC following. Per MD, pt's family informed him that pt will be going to stay with granddaughter at [facility] and they are requesting Hospice care at home and have already spoken with Cassandra at [facility]. Dr. Carles Collet states pt is hospice appropriate.   Spoke with pt's daughter, Marlowe Kays, who confirms the above. She has already spoken with Cassandra. Referral sent to Hospice in [place]. Spoke with Cassandra by phone. She will follow up with family to discuss DME needs and make arrangements to be ready for pt to dc home tomorrow.   Expected Discharge Plan: Home w Hospice Care Barriers to Discharge: Continued Medical Work up  Expected Discharge Plan and Services Expected Discharge Plan: [place] In-house Referral: Clinical Social Work Discharge Planning Services: CM Consult Post Acute Care Choice: Hospice Living arrangements for the past 2 months: Single Family Home                                       Social Determinants of Health (SDOH) Interventions    Readmission Risk Interventions Readmission Risk Prevention Plan 04/20/2019 03/15/2019 03/02/2019  Transportation Screening Complete Complete Complete  PCP or Specialist Appt within 5-7 Days - - -  PCP or Specialist Appt within 3-5 Days - - Complete  Home Care Screening - - -  Medication Review (RN CM) - - -  [place] or [place] - Complete Complete  Social Work Consult for [place] Planning/Counseling - Complete Complete  Palliative Care Screening - Not Applicable Complete  Medication Review Press photographer) Complete Complete Complete  PCP or Specialist appointment within 3-5 days of discharge Not Complete - -  [place] or Home Care Consult Complete -  -  SW Recovery Care/Counseling Consult Complete - -  Palliative Care Screening Not Complete - -  Skilled Nursing Facility Complete - -  Some recent data might be hidden

## 2019-05-03 NOTE — Progress Notes (Signed)
Physical Therapy Treatment Patient Details Name: Renee Blake MRN: [id number] DOB: 03-25-1929 Today's Date: 05/03/2019    History of Present Illness Renee Blake is a 86 y.o. female with medical history significant of history of anemia, chronic atrial fibrillation, chronic diarrhea, dilation of biliary tract, esophageal stricture, duodenal stricture, sliding hiatal hernia, gastric ulcer, history of GI bleed, hyperlipidemia, hypertension, microscopic colitis, nephrolithiasis, pancreatic duct dilation, Schatzki ring, skin cancer, vertigo who was recently admitted from January 16 to January 16 due to diarrhea, malnutrition, weakness and diastolic CHF exacerbation.  She is [facility] due to confusion and hot flashes.  Her daughter stated to Dr. Rogene Houston that she usually has these hot flashes episodes when [name] starts acting up.  The patient is unable to elaborate much but has been complaining of lower extremity discomfort. Baseline 2 LPM via [place].    PT Comments    Patient exhibited increased tolerance for activity today. She was able to ambulate 250 feet today compared to 85 feet in her previous PT session. After a brief seated rest break, patient tolerated therapeutic exercises seated in chair after ambulation. Daughter was in room throughout session.  Patient would continue to benefit from skilled physical therapy in current environment and next venue to continue return to prior function and increase strength, endurance, balance, coordination, and functional mobility and gait skills.    Follow Up Recommendations  SNF;Supervision for mobility/OOB;Supervision - Intermittent     Equipment Recommendations  None recommended by PT    Recommendations for Other Services       Precautions / Restrictions Precautions Precautions: Fall Restrictions Weight Bearing Restrictions: No    Mobility  Bed Mobility Overal bed mobility: Modified  Independent             General bed mobility comments: slightly labored movement  Transfers Overall transfer level: Needs assistance Equipment used: Rolling walker (2 wheeled) Transfers: Sit to/from [facility] Sit to Stand: Min guard Stand pivot transfers: Min guard       General transfer comment: slightly increased time, labored movement  Ambulation/Gait Ambulation/Gait assistance: Min guard Gait Distance (Feet): 250 Feet Assistive device: Rolling walker (2 wheeled) Gait Pattern/deviations: Decreased step length - right;Decreased step length - left;Decreased stride length Gait velocity: decreased   General Gait Details: increased endurance/distance for ambulation with slow labored cadence, no loss of balance, limited secondary to c/o fatigue   Stairs             Wheelchair Mobility    Modified Rankin (Stroke Patients Only)       Balance Overall balance assessment: Needs assistance Sitting-balance support: Feet supported;No upper extremity supported Sitting balance-Leahy Scale: Good Sitting balance - Comments: seated EOB   Standing balance support: During functional activity;No upper extremity supported Standing balance-Leahy Scale: Poor Standing balance comment: fair/poor without AD, fair/good using RW                            Cognition Arousal/Alertness: Awake/alert Behavior During Therapy: WFL for tasks assessed/performed Overall Cognitive Status: Within Functional Limits for tasks assessed                                        Exercises General Exercises - Upper Extremity Shoulder Flexion: AROM;Strengthening;Both;10 reps General Exercises - Lower Extremity Long Arc Quad: AROM;Strengthening;Both;10 reps;Seated  Hip Flexion/Marching: AROM;Strengthening;Both;10 reps;Seated Toe Raises: AROM;Strengthening;Both;10 reps;Seated Heel Raises: AROM;Strengthening;Both;10 reps;Seated Other Exercises Other  Exercises: sit to stand with min assist 2 hand held to/from chair x5    General Comments        Pertinent Vitals/Pain Pain Assessment: No/denies pain    Home Living                      Prior Function            PT Goals (current goals can now be found in the care plan section) Acute Rehab PT Goals Patient Stated Goal: return home with family to assist PT Goal Formulation: With patient Time For Goal Achievement: 05/13/19 Potential to Achieve Goals: Good Progress towards PT goals: Progressing toward goals    Frequency    Min 3X/week      PT Plan Current plan remains appropriate    Co-evaluation              AM-PAC PT "6 Clicks" Mobility   Outcome Measure  Help needed turning from your back to your side while in a flat bed without using bedrails?: None Help needed moving from lying on your back to sitting on the side of a flat bed without using bedrails?: None Help needed moving to and from a bed to a chair (including a wheelchair)?: A Little Help needed standing up from a chair using your arms (e.g., wheelchair or bedside chair)?: None Help needed to walk in hospital room?: A Little Help needed climbing 3-5 steps with a railing? : A Little 6 Click Score: 21    End of Session Equipment Utilized During Treatment: Gait belt Activity Tolerance: Patient tolerated treatment well;Patient limited by fatigue Patient left: in chair;with call bell/phone within reach;with family/visitor present Nurse Communication: Mobility status PT Visit Diagnosis: Unsteadiness on feet (R26.81);Other abnormalities of gait and mobility (R26.89);Muscle weakness (generalized) (M62.81)     Time: 1300-1330 PT Time Calculation (min) (ACUTE ONLY): 30 min  Charges:  $Gait Training: 8-22 mins $Therapeutic Exercise: 8-22 mins                     Floria Raveling. Hartnett-Rands, MS, PT Per [place] [id number] 05/03/2019, 1:38 PM

## 2019-05-03 NOTE — Progress Notes (Signed)
PROGRESS NOTE  Renee Blake [id number] DOB: Sep 17, 1928 DOA: 04/26/2019 PCP: [name], MD  Brief History: 86 year old female with a history of permanent atrial fibrillation not on anticoagulation secondary to GI bleed, diastolic CHF, CKD stage III, endometrial carcinoma, and squamous cell carcinoma of the anal canal [facility], [place] edema,and hot flashes.She was recently admitted from 03/13/19 to 03/18/19 with acute on chronic dCHF. Her d/c weight was 112.8 lbs, and she was d/ced with lasix 40 mg daily. Initial labs showed WBC 6.3, Hgb 8.6, platelets 232, Na+ 140, K+ 3.4 and creatinine 1.14. Mg 1.9. BNP 947. COVID negative. CXR showed increased streaky right lung opacity thought to be most consistent with atelectasis but infection could not be ruled-out. Chest CT showed small to moderate bilateral pleural effusions, areas of atelectasis and peribronchial thickening compatible with bronchitis. EKG shows atrial fibrillation, HR 80 with RBBB and LAFB.   She was admitted for an acute CHF exacerbation and has been started on IV Lasix 20mg . Weight listed as 125 lbs on admission.  I/Os have been incomplete.  The patient's weights were used in addition to her clinical response and cxr to judge her response.  Lasix was gradually increased to 40 mg bid.  Her weight gradually improved.  She was weaned off oxygen.  Assessment/Plan: Acute respiratory failure with hypoxia -Secondary to exacerbation of diastolic CHF -Patient wason 3L -now weaned to RA  Acute on chronic diastolic CHF -appreciate cardiology follow up -continuemetoprolol succinate -ContinueIV lasix to bid -personally reviewed CXR--increased interstitial marking -01/19/19 Echo EF 60-65%, G2DD, moderate elevated PASP -12/17/20discharge weight 112.8 lbs -05/01/19 standing weight 119.2 -05/02/19 standing weight 117.0 -05/03/19 standing weight 113.4 -repeat  decubitus xray on 05/03/19--personally reviewed--bilateral pleural effusions  Permanent Atrial Fibrillation -continue diltiazemSR and metoprolol succinate -amiodarone stopped during December 2020 admission -not on AC due to GIB,duodenal ulcers, anemia -rate controlled  Nausea/hot flash/FTT -Post ingestion of morning meds developing nausea, hot flashes, but no vomiting -evaluatedmedication and dividedthe doses -As needed antiemetics -No signs of infection -History of GI bleed,>> H&H remained stable -repeat Hemoccult--neg -CEA normal at 4.4 -03/18/19 TSH 13.447 -03/18/19 Free T4--1.07 -check B12--150 on 03/18/19-->repleting -folate 13.4 -tolerating diet presently  CKD stage 3b -baseline creatinine 1.2-1.4 -monitor with diuresis -am BMP  History ofSCCanorectal cancer/endometrial carcinoma/ -Hemoccult--neg -Stable -CEA normal at 4.4 -patient followed by radoncology, GYN and medical oncology team from [facility] by [facility]. No recent follow-up -03/17/19 CT abd/pelvis--no acute findings, no mets, nonobstructine R-renal calculi -Last Colonoscopy:April 2019-new findings include nodular hard mass anterior anal orifice, DRE revealed 5 cm hard rectal masscircumferential anterior bowel wall, 6 mm polyp distal sigmoid. Path w/rectum distal lesion biopsy-at least high-grade squamous intraepithelial lesion suspicious for invasion, sigmoid polyp inflammatory  Hypokalemia -replete  Goals of Care -Advance care planning, including the explanation and discussion of advance directives was carried out with the patient and family. Code status including explanations of "Full Code" and "DNR" and alternatives were discussed in detail. Discussion of end-of-life issues including but not limited palliative care, hospice care and the concept of hospice, other end-of-life care options, power of attorney for health care decisions, living wills, and physician orders for life-sustaining treatment were  also discussed with the patient and family. Total face to face time 16 minutes. -consult palliative med     Disposition Plan:Home 05/04/19 if ok with cardiology--refuses SNF Family Communication:Daughter updatedon phone2/1/21  Consultants:cardiology  Code Status: DNR  DVT Prophylaxis:  Gays Lovenox   Procedures: As Listed in Progress Note Above  Antibiotics: None     Subjective: Patient denies fevers, chills, headache, chest pain, dyspnea, nausea, vomiting, diarrhea, abdominal pain, dysuria, hematuria, hematochezia, and melena.   Objective: Vitals:   05/03/19 0500 05/03/19 0512 05/03/19 0917 05/03/19 1455  BP:  (!) 153/92 (!) 134/92 (!) 133/93  Pulse:  (!) 116 87 90  Resp:  16  18  Temp:  (!) 97.5 F (36.4 C)  97.8 F (36.6 C)  TempSrc:  Oral  Oral  SpO2:  99%  97%  Weight: 51.8 kg     Height:        Intake/Output Summary (Last 24 hours) at 05/03/2019 1604 Last data filed at 05/03/2019 1200 Gross per 24 hour  Intake 150 ml  Output --  Net 150 ml   Weight change: -0.3 kg Exam:   General:  Pt is alert, follows commands appropriately, not in acute distress  HEENT: No icterus, No thrush, No neck mass, [place]/AT  Cardiovascular: RRR, S1/S2, no rubs, no gallops  Respiratory: fine bibasilar crackles. No wheeze  Abdomen: Soft/+BS, non tender, non distended, no guarding  Extremities: No edema, No lymphangitis, No petechiae, No rashes, no synovitis   Data Reviewed: I have personally reviewed following labs and imaging studies Basic Metabolic Panel: Recent Labs  Lab 04/29/19 0440 04/30/19 0629 05/01/19 0638 05/02/19 0625 05/03/19 0434  NA 138 141 140 141 143  K 3.3* 3.5 3.4* 3.0* 4.2  CL 105 106 105 103 107  CO2 25 24 26 26 28   GLUCOSE 79 92 83 82 90  BUN 17 13 13 15 18   CREATININE 1.09* 0.93 0.98 1.02* 1.14*  CALCIUM 8.4* 8.8* 8.5* 8.5* 9.1  MG  --  2.1  --  1.9 2.1   Liver Function Tests: Recent Labs  Lab 04/26/19 1947  AST  48*  ALT 82*  ALKPHOS 100  BILITOT 0.2*  PROT 7.0  ALBUMIN 3.4*   No results for input(s): LIPASE, AMYLASE in the last 168 hours. No results for input(s): AMMONIA in the last 168 hours. Coagulation Profile: No results for input(s): INR, PROTIME in the last 168 hours. CBC: Recent Labs  Lab 04/26/19 1947  WBC 10.0  NEUTROABS 7.4  HGB 10.3*  HCT 35.8*  MCV 97.3  PLT 292   Cardiac Enzymes: No results for input(s): CKTOTAL, CKMB, CKMBINDEX, TROPONINI in the last 168 hours. BNP: Invalid input(s): POCBNP CBG: No results for input(s): GLUCAP in the last 168 hours. HbA1C: No results for input(s): HGBA1C in the last 72 hours. Urine analysis:    Component Value Date/Time   COLORURINE YELLOW 01/27/2019 1533   APPEARANCEUR CLOUDY (A) 01/27/2019 1533   LABSPEC 1.005 01/27/2019 1533   PHURINE 7.0 01/27/2019 1533   GLUCOSEU NEGATIVE 01/27/2019 1533   HGBUR SMALL (A) 01/27/2019 1533   BILIRUBINUR NEGATIVE 01/27/2019 1533   Orient 01/27/2019 1533   PROTEINUR NEGATIVE 01/27/2019 1533   UROBILINOGEN 0.2 08/22/2014 1300   NITRITE NEGATIVE 01/27/2019 1533   LEUKOCYTESUR LARGE (A) 01/27/2019 1533   Sepsis Labs: @LABRCNTIP (procalcitonin:4,lacticidven:4) ) Recent Results (from the past 240 hour(s))  SARS CORONAVIRUS 2 (Burel K[place]re 6-24 HRS) Nasopharyngeal Nasopharyngeal Swab     Status: None   Collection Time: 04/26/19 10:52 PM   Specimen: Nasopharyngeal Swab  Result Value Ref Range Status   SARS Coronavirus 2 NEGATIVE NEGATIVE Final    Comment: (NOTE) SARS-CoV-2 target nucleic acids are NOT DETECTED. The SARS-CoV-2 RNA is generally detectable in upper and  lower respiratory specimens during the acute phase of infection. Negative results do not preclude SARS-CoV-2 infection, do not rule out co-infections with other pathogens, and should not be used as the sole basis for treatment or other patient management decisions. Negative results must be combined with clinical  observations, patient history, and epidemiological information. The expected result is Negative. Fact Sheet for Patients: SugarRoll.be Fact Sheet for Healthcare Providers: https://www.woods-mathews.com/ This test is not yet approved or cleared by the [place] FDA and  has been authorized for detection and/or diagnosis of SARS-CoV-2 by FDA under an Emergency Use Authorization (EUA). This EUA will remain  in effect (meaning this test can be used) for the duration of the COVID-19 declaration under Section 56 4(b)(1) of the Act, 21 U.S.C. section 360bbb-3(b)(1), unless the authorization is terminated or revoked sooner. Performed at [facility], [place] [address] [place], [place] [address]   MRSA PCR Screening     Status: None   Collection Time: 04/27/19  1:25 AM   Specimen: Nasal Mucosa; Nasopharyngeal  Result Value Ref Range Status   MRSA by PCR NEGATIVE NEGATIVE Final    Comment:        The GeneXpert MRSA Assay (FDA approved for NASAL specimens only), is one component of a comprehensive MRSA colonization surveillance program. It is not intended to diagnose MRSA infection nor to guide or monitor treatment for MRSA infections. Performed at [facility], [address] [place], [place] [address]      Scheduled Meds: . diltiazem  90 mg Oral BID  . enoxaparin (LOVENOX) injection  30 mg Subcutaneous Q24H  . feeding supplement  1 Container Oral BID BM  . furosemide  40 mg Intravenous BID  . levothyroxine  25 mcg Oral QAC breakfast  . metoprolol succinate  50 mg Oral Daily  . multivitamin with minerals  1 tablet Oral Daily  . potassium chloride  20 mEq Oral Daily  . pregabalin  25 mg Oral BID  . vitamin B-12  500 mcg Oral Daily   Continuous Infusions:  Procedures/Studies: CT Chest W Contrast  Result Date: 04/26/2019 CLINICAL DATA:  Pneumonia, effusions suspected. EXAM: CT CHEST WITH CONTRAST TECHNIQUE: Multidetector CT  imaging of the chest was performed during intravenous contrast administration. CONTRAST:  55mL OMNIPAQUE IOHEXOL 300 MG/ML  SOLN COMPARISON:  Chest x-ray today.  Chest CT 04/06/2019 FINDINGS: Cardiovascular: Heart is enlarged. Aorta normal caliber with scattered calcifications. No filling defects in the pulmonary arteries to suggest pulmonary emboli. Mediastinum/Nodes: No mediastinal, hilar, or axillary adenopathy. Scattered small mediastinal lymph nodes. Lungs/Pleura: Enlarging bilateral pleural effusions since prior chest CT. Right pleural effusion is loculated. Compressive atelectasis in the left lower lobe. Bandlike opacity in the right lower lobe, lingula and right middle lobe, likely areas of atelectasis. There is peribronchial thickening diffusely. Biapical scarring. Upper Abdomen: Imaging into the upper abdomen shows no acute findings. Musculoskeletal: Chest wall soft tissues are unremarkable. No acute bony abnormality. IMPRESSION: Small to moderate bilateral pleural effusions which have increased in size since prior chest CT. Right pleural effusion is loculated. Areas of atelectasis in the lower lobes bilaterally as well as right middle lobe and lingula. Peribronchial thickening compatible with bronchitis. Cardiomegaly. Electronically Signed   By: Rolm Baptise M.D.   On: 04/26/2019 21:54   DG Chest Bilateral Decubitus  Result Date: 05/03/2019 CLINICAL DATA:  Pt c/o intermittent SOB. Pleural effusion AND Pulmonary edema. COVID negative 04/26/19. HX HTN, A-fib, nonsmoker EXAM: CHEST - BILATERAL DECUBITUS VIEW COMPARISON:  none  FINDINGS: Layering moderate left pleural effusion. Smaller layering component of right effusion. Mild interstitial edema or infiltrates marginally increased. Heart size upper limits normal for technique. Aortic Atherosclerosis (ICD10-170.0). No pneumothorax. IMPRESSION: Moderate left and small right pleural effusions. Electronically Signed   By: Lucrezia Europe M.D.   On: 05/03/2019 08:04     DG Chest Bilateral Decubitus  Result Date: 04/30/2019 CLINICAL DATA:  Heart failure. EXAM: CHEST - BILATERAL DECUBITUS VIEW COMPARISON:  CT chest and chest x-ray dated April 26, 2019. FINDINGS: Bilateral decubitus x-rays demonstrate small layering pleural effusions. Unchanged cardiomegaly and interstitial edema. IMPRESSION: 1. Stable congestive heart failure with interstitial edema and small bilateral pleural effusions. Electronically Signed   By: Titus Dubin M.D.   On: 04/30/2019 10:18   DG Chest Port 1 View  Result Date: 04/26/2019 CLINICAL DATA:  86 year old female with confusion, "Hot flashes" which the patient has complained of when in atrial fibrillation previously. EXAM: PORTABLE CHEST 1 VIEW COMPARISON:  Portable chest 04/17/2019 and earlier. FINDINGS: Portable AP upright view at 1859 hours. Stable cardiomegaly and mediastinal contours. Hazy and patchy opacity in the right lung which might appear similar to that on 04/05/2019 portable chest. But increased streaky right infrahilar opacity. Pulmonary vascularity appears decreased from 04/17/2019. No pneumothorax. Small bilateral pleural effusions seen by CTA on 04/06/2019 have been poorly visible radiographically. No confluent left lung opacity. Stable visualized osseous structures. Negative visible bowel gas pattern. IMPRESSION: 1. Nonspecific increased streaky right lung opacity since 04/17/2019. Favor increased atelectasis but acute infection is difficult to exclude. 2. Otherwise stable including cardiomegaly, small pleural effusions which were better demonstrated by CTA earlier this month. Electronically Signed   By: Genevie Ann M.D.   On: 04/26/2019 19:08   DG Chest Port 1 View  Result Date: 04/17/2019 CLINICAL DATA:  Weakness. Recent pneumonia. EXAM: PORTABLE CHEST 1 VIEW COMPARISON:  Radiograph 04/05/2019, CT 04/06/2019. Prior imaging at [facility]. FINDINGS: Cardiomegaly, similar to slightly progressed from prior. Bilateral pleural  effusions with blunting of the costophrenic angles. Worsening interstitial opacities. No pneumothorax. Biapical pleuroparenchymal scarring. IMPRESSION: 1. Findings consistent with CHF. Slight worsening in pulmonary edema and cardiomegaly from prior. 2. Small pleural effusions are similar to prior on AP projection. Electronically Signed   By: Keith Rake M.D.   On: 04/17/2019 19:45    Orson Eva, DO  [facility] Pager [phone]  If 7PM-7AM, please contact night-coverage [email or web address] Password [email or web address] 05/03/2019, 4:04 PM   LOS: 6 days

## 2019-05-03 NOTE — Care Management Important Message (Signed)
Important Message  Patient Details  Name: Renee Blake MRN: [id number] Date of Birth: 12-15-1928   Medicare Important Message Given:  Yes     [name] 05/03/2019, 11:38 AM

## 2019-05-03 NOTE — TOC Progression Note (Signed)
Ovaline, Windler [id number] (CSN: [id number]) (86 y.o. F) (Adm: 04/26/19) [id number] PCP  [name] Demographics Comment   Last edited by  on at   Address: Home Phone: Work Designer, industrial/product: Mobile Phone:  [place]  [place] [address]   [phone] -- [phone]  SSN: Insurance: Marital Status: Religion:  [phone] [place]  Patient Ethnicity & Race  Ethnic Group Patient Race  Not Hispanic or Latino White or Caucasian  Documents Filed to Patient  Power of Attorney Living Will Clinical Unknown Study Attachment Consent Form ABN Waiver After Visit Summary Lab Result Scan Code Status MyChart Status Advance Care Planning  Not on File Not on File Not on File Not on File Filed Not on [place] DNR [Updated on 04/27/19 0032] Pending Jump to the Activity  Admission Information  Current Information  Attending Provider Admitting Provider Admission Type Admission Status  Tat, Shanon Brow, MD Barton Dubois, MD Emergency Admission (Confirmed)       Admission Date/Time Discharge Date Hospital Service Auth/Cert Status  [id number] 06:36 PM  [place] [facility] Room/Bed   [facility] AP-DEPT [facility] Account  Name Acct ID Class Status Primary Coverage  Yaritzel, Morey [id number] [place]      Guarantor Account (for [place] [id number])  Name Relation to [place]? Acct Type  [name] Self CHSA Yes Personal/Family  Address Phone    [place], [place] [address] [phone])        Coverage Information (for Hospital Account [id number])  F/O Payor/Plan Precert #  [place] [id number]  [place] #  Pade[place], Wadel [id number]  Address [place] [address] [phone]       Care Everywhere ID:  [id number]

## 2019-05-04 LAB — BASIC METABOLIC PANEL
Anion gap: 13 (ref 5–15)
BUN: 19 mg/dL (ref 8–23)
CO2: 28 mmol/L (ref 22–32)
Calcium: 8.8 mg/dL — ABNORMAL LOW (ref 8.9–10.3)
Chloride: 100 mmol/L (ref 98–111)
Creatinine, Ser: 1.08 mg/dL — ABNORMAL HIGH (ref 0.44–1.00)
GFR calc Af Amer: 53 mL/min — ABNORMAL LOW (ref >60)
GFR calc non Af Amer: 46 mL/min — ABNORMAL LOW (ref >60)
Glucose, Bld: 85 mg/dL (ref 70–99)
Potassium: 3.6 mmol/L (ref 3.5–5.1)
Sodium: 141 mmol/L (ref 135–145)

## 2019-05-04 LAB — MAGNESIUM: Magnesium: 2.1 mg/dL (ref 1.7–2.4)

## 2019-05-04 MED ORDER — TORSEMIDE 20 MG PO TABS: 40.0000 mg | ORAL_TABLET | Freq: Two times a day (BID) | ORAL | 1 refills | Status: DC

## 2019-05-04 MED ORDER — FUROSEMIDE 10 MG/ML IJ SOLN: 40.0000 mg | Freq: Once | INTRAMUSCULAR | Status: DC

## 2019-05-04 MED ORDER — TORSEMIDE 20 MG PO TABS: 40.0000 mg | ORAL_TABLET | Freq: Two times a day (BID) | ORAL | Status: DC

## 2019-05-04 MED ORDER — TORSEMIDE 20 MG PO TABS: 20.0000 mg | ORAL_TABLET | Freq: Two times a day (BID) | ORAL | Status: DC

## 2019-05-04 MED ORDER — PREGABALIN 25 MG PO CAPS: 25.0000 mg | ORAL_CAPSULE | Freq: Two times a day (BID) | ORAL | 1 refills | Status: DC

## 2019-05-04 NOTE — Discharge Summary (Signed)
Physician Discharge Summary  Renee Blake [id number] DOB: September 18, 1928 DOA: 04/26/2019  PCP: [name], MD  Admit date: 04/26/2019 Discharge date: 05/04/2019  Admitted From: Home Disposition:  Home (refuses SNF)  Recommendations for Outpatient Follow-up:  1. Follow up with PCP in 1-2 weeks 2. Please obtain BMP/CBC in one week   Home Health: Home with hospice   Discharge Condition: Stable CODE STATUS: FULL Diet recommendation: Heart Healthy   Brief/Interim Summary: 86 year old female with a history of permanent atrial fibrillation not on anticoagulation secondary to GI bleed, diastolic CHF, CKD stage III, endometrial carcinoma, and squamous cell carcinoma of the anal canal [place], [place], [place] edema,and hot flashes.She was recently admitted from 03/13/19 to 03/18/19 with acute on chronic dCHF. Her d/c weight was 112.8 lbs, and she was d/ced with lasix 40 mg daily. Initial labs showed WBC 6.3, Hgb 8.6, platelets 232, Na+ 140, K+ 3.4 and creatinine 1.14. Mg 1.9. BNP 947. COVID negative. CXR showed increased streaky right lung opacity thought to be most consistent with atelectasis but infection could not be ruled-out. Chest CT showed small to moderate bilateral pleural effusions, areas of atelectasis and peribronchial thickening compatible with bronchitis. EKG shows atrial fibrillation, HR 80 with RBBB and LAFB.   She was admitted for an acute CHF exacerbation and has been started on IV Lasix 20mg . Weight listed as 125 lbs on admission.  I/Os have been incomplete.  The patient's weights were used in addition to her clinical response and cxr to judge her response.  Lasix was gradually increased to 40 mg bid.  Her weight gradually improved.  She was weaned off oxygen.  Discharge Diagnoses:  Acute respiratory failure with hypoxia -Secondary to exacerbation of diastolic CHF -Patient wason 3L -now weaned to RA  Acute on  chronic diastolic CHF -appreciate cardiology follow up -continuemetoprolol succinate -ContinuedIV lasix to bid during hospitalization -personally reviewed CXR--increased interstitial marking -01/19/19 Echo EF 60-65%, G2DD, moderate elevated PASP -12/17/20discharge weight 112.8 lbs -05/01/19 standing weight 119.2 -05/02/19 standing weight 117.0 -05/03/19 standing weight 113.4 -05/04/19 standing weight 110.0 lbs--discharge day -I have personally obtained these weights -d/c home with torsemide 40 mg bid -case discussed with cardiology--they will arrange for outpt follow up  Permanent Atrial Fibrillation -continue diltiazemSR and metoprolol succinate -amiodarone stopped during December 2020 admission -not on [facility] due to GIB,duodenal ulcers, anemia -rate controlled  Nausea/hot flash/FTT -Post ingestion of morning meds developing nausea, hot flashes, but no vomiting--this has been a chronic problem -evaluatedmedication and dividedthe doses -As needed antiemetics -No signs of infection -History of GI bleed,>> H&H remained stable -repeat Hemoccult--neg -CEA normal at 4.4 -03/18/19 TSH 13.447 -03/18/19 Free T4--1.07 -check B12--150 on 03/18/19-->repleting -folate 13.4 -tolerating diet presently  CKD stage 3b -baseline creatinine 1.2-1.4 -monitor with diuresis -serum creatinine 1.08 on day of d/c  History ofSCCanorectal cancer/endometrial carcinoma/ -Hemoccult--neg -Stable -CEA normal at 4.4 -patient followed by radoncology, GYN and medical oncology team from [facility] by [place]. No recent follow-up -12/16/20CT abd/pelvis--no acute findings, no mets, nonobstructine R-renal calculi -Last Colonoscopy:April 2019-new findings include nodular hard mass anterior anal orifice, DRE revealed 5 cm hard rectal masscircumferential anterior bowel wall, 6 mm polyp distal sigmoid. Path w/rectum distal lesion biopsy-at least high-grade squamous intraepithelial lesion suspicious for invasion,  sigmoid polyp inflammatory  Hypokalemia -replete  Goals of Care -Advance care planning, including the explanation and discussion of advance directives was carried out with the patient and family. Code status including explanations of "Full Code" and "DNR" and  alternatives were discussed in detail. Discussion of end-of-life issues including but not limited palliative care, hospice care and the concept of hospice, other end-of-life care options, power of attorney for health care decisions, living wills, and physician orders for life-sustaining treatment were also discussed with the patient and family. Total face to face time 16 minutes. -consult palliative med      Discharge Instructions   Allergies as of 05/04/2019      Reactions   Clonazepam Other (See Comments)   Pruritis   Codeine Nausea And Vomiting, Other (See Comments)   Reaction unknown   Sulfa Antibiotics Other (See Comments)   Reactions unknown      Medication List    STOP taking these medications   furosemide 40 MG tablet Commonly known as: LASIX     TAKE these medications   acetaminophen 325 MG tablet Commonly known as: TYLENOL Take 650 mg by mouth every 6 (six) hours as needed for mild pain or moderate pain.   ALPRAZolam 0.25 MG tablet Commonly known as: XANAX Take 1 tablet (0.25 mg total) by mouth 2 (two) times daily as needed. for anxiety   dicyclomine 10 MG capsule Commonly known as: BENTYL Take 10 mg by mouth every 4 (four) hours as needed for spasms.   diltiazem 90 MG 12 hr capsule Commonly known as: CARDIZEM SR Take 1 capsule (90 mg total) by mouth 2 (two) times daily.   levocetirizine 5 MG tablet Commonly known as: XYZAL Take 5 mg by mouth every evening.   levothyroxine 25 MCG tablet Commonly known as: SYNTHROID Take 25 mcg by mouth daily before breakfast.   meclizine 25 MG tablet Commonly known as: ANTIVERT Take 25 mg by mouth daily as needed for dizziness.   metoprolol succinate  50 MG 24 hr tablet Commonly known as: TOPROL-XL Take 1 tablet (50 mg total) by mouth every morning. Take with or immediately following a meal.   multivitamin with minerals Tabs tablet Take 1 tablet by mouth daily.   potassium chloride SA 20 MEQ tablet Commonly known as: KLOR-CON Take 20 mEq by mouth daily.   pregabalin 25 MG capsule Commonly known as: LYRICA Take 1 capsule (25 mg total) by mouth 2 (two) times daily.   torsemide 20 MG tablet Commonly known as: DEMADEX Take 2 tablets (40 mg total) by mouth 2 (two) times daily. Start taking on: May 05, 2019   vitamin B-12 500 MCG tablet Commonly known as: CYANOCOBALAMIN Take 1 tablet (500 mcg total) by mouth daily.   Zofran 4 MG tablet Generic drug: ondansetron Take 4 mg by mouth every 8 (eight) hours as needed for nausea or vomiting.       Allergies  Allergen Reactions  . Clonazepam Other (See Comments)    Pruritis  . Codeine Nausea And Vomiting and Other (See Comments)    Reaction unknown  . Sulfa Antibiotics Other (See Comments)    Reactions unknown    Consultations:  cardiology   Procedures/Studies: CT Chest W Contrast  Result Date: 04/26/2019 CLINICAL DATA:  Pneumonia, effusions suspected. EXAM: CT CHEST WITH CONTRAST TECHNIQUE: Multidetector CT imaging of the chest was performed during intravenous contrast administration. CONTRAST:  65mL OMNIPAQUE IOHEXOL 300 MG/ML  SOLN COMPARISON:  Chest x-ray today.  Chest CT 04/06/2019 FINDINGS: Cardiovascular: Heart is enlarged. Aorta normal caliber with scattered calcifications. No filling defects in the pulmonary arteries to suggest pulmonary emboli. Mediastinum/Nodes: No mediastinal, hilar, or axillary adenopathy. Scattered small mediastinal lymph nodes. Lungs/Pleura: Enlarging bilateral pleural effusions since  prior chest CT. Right pleural effusion is loculated. Compressive atelectasis in the left lower lobe. Bandlike opacity in the right lower lobe, lingula and right  middle lobe, likely areas of atelectasis. There is peribronchial thickening diffusely. Biapical scarring. Upper Abdomen: Imaging into the upper abdomen shows no acute findings. Musculoskeletal: Chest wall soft tissues are unremarkable. No acute bony abnormality. IMPRESSION: Small to moderate bilateral pleural effusions which have increased in size since prior chest CT. Right pleural effusion is loculated. Areas of atelectasis in the lower lobes bilaterally as well as right middle lobe and lingula. Peribronchial thickening compatible with bronchitis. Cardiomegaly. Electronically Signed   By: Rolm Baptise M.D.   On: 04/26/2019 21:54   DG Chest Bilateral Decubitus  Result Date: 05/03/2019 CLINICAL DATA:  Pt c/o intermittent SOB. Pleural effusion AND Pulmonary edema. COVID negative 04/26/19. HX HTN, A-fib, nonsmoker EXAM: CHEST - BILATERAL DECUBITUS VIEW COMPARISON:  none FINDINGS: Layering moderate left pleural effusion. Smaller layering component of right effusion. Mild interstitial edema or infiltrates marginally increased. Heart size upper limits normal for technique. Aortic Atherosclerosis (ICD10-170.0). No pneumothorax. IMPRESSION: Moderate left and small right pleural effusions. Electronically Signed   By: Lucrezia Europe M.D.   On: 05/03/2019 08:04   DG Chest Bilateral Decubitus  Result Date: 04/30/2019 CLINICAL DATA:  Heart failure. EXAM: CHEST - BILATERAL DECUBITUS VIEW COMPARISON:  CT chest and chest x-ray dated April 26, 2019. FINDINGS: Bilateral decubitus x-rays demonstrate small layering pleural effusions. Unchanged cardiomegaly and interstitial edema. IMPRESSION: 1. Stable congestive heart failure with interstitial edema and small bilateral pleural effusions. Electronically Signed   By: Titus Dubin M.D.   On: 04/30/2019 10:18   DG Chest Port 1 View  Result Date: 04/26/2019 CLINICAL DATA:  86 year old female with confusion, "Hot flashes" which the patient has complained of when in atrial  fibrillation previously. EXAM: PORTABLE CHEST 1 VIEW COMPARISON:  Portable chest 04/17/2019 and earlier. FINDINGS: Portable AP upright view at 1859 hours. Stable cardiomegaly and mediastinal contours. Hazy and patchy opacity in the right lung which might appear similar to that on 04/05/2019 portable chest. But increased streaky right infrahilar opacity. Pulmonary vascularity appears decreased from 04/17/2019. No pneumothorax. Small bilateral pleural effusions seen by CTA on 04/06/2019 have been poorly visible radiographically. No confluent left lung opacity. Stable visualized osseous structures. Negative visible bowel gas pattern. IMPRESSION: 1. Nonspecific increased streaky right lung opacity since 04/17/2019. Favor increased atelectasis but acute infection is difficult to exclude. 2. Otherwise stable including cardiomegaly, small pleural effusions which were better demonstrated by CTA earlier this month. Electronically Signed   By: Genevie Ann M.D.   On: 04/26/2019 19:08   DG Chest Port 1 View  Result Date: 04/17/2019 CLINICAL DATA:  Weakness. Recent pneumonia. EXAM: PORTABLE CHEST 1 VIEW COMPARISON:  Radiograph 04/05/2019, CT 04/06/2019. [facility]. FINDINGS: Cardiomegaly, similar to slightly progressed from prior. Bilateral pleural effusions with blunting of the costophrenic angles. Worsening interstitial opacities. No pneumothorax. Biapical pleuroparenchymal scarring. IMPRESSION: 1. Findings consistent with CHF. Slight worsening in pulmonary edema and cardiomegaly from prior. 2. Small pleural effusions are similar to prior on AP projection. Electronically Signed   By: Keith Rake M.D.   On: 04/17/2019 19:45        Discharge Exam: Vitals:   05/03/19 2202 05/04/19 0620  BP: 125/85 99/70  Pulse: 90 81  Resp:  18  Temp:  98.1 F (36.7 C)  SpO2:  97%   Vitals:   05/03/19 1455 05/03/19 2141 05/03/19 2202 05/04/19  0620  BP: (!) 133/93 106/64 125/85 99/70  Pulse: 90 92 90  81  Resp: 18 20  18   Temp: 97.8 F (36.6 C) 98.2 F (36.8 C)  98.1 F (36.7 C)  TempSrc: Oral Oral  Oral  SpO2: 97% 94%  97%  Weight:    49.7 kg  Height:        General: Pt is alert, awake, not in acute distress Cardiovascular: IRRR, S1/S2 +, no rubs, no gallops Respiratory: CTA bilaterally, no wheezing, no rhonchi Abdominal: Soft, NT, ND, bowel sounds + Extremities: no edema, no cyanosis   The results of significant diagnostics from this hospitalization (including imaging, microbiology, ancillary and laboratory) are listed below for reference.    Significant Diagnostic Studies: CT Chest W Contrast  Result Date: 04/26/2019 CLINICAL DATA:  Pneumonia, effusions suspected. EXAM: CT CHEST WITH CONTRAST TECHNIQUE: Multidetector CT imaging of the chest was performed during intravenous contrast administration. CONTRAST:  12mL OMNIPAQUE IOHEXOL 300 MG/ML  SOLN COMPARISON:  Chest x-ray today.  Chest CT 04/06/2019 FINDINGS: Cardiovascular: Heart is enlarged. Aorta normal caliber with scattered calcifications. No filling defects in the pulmonary arteries to suggest pulmonary emboli. Mediastinum/Nodes: No mediastinal, hilar, or axillary adenopathy. Scattered small mediastinal lymph nodes. Lungs/Pleura: Enlarging bilateral pleural effusions since prior chest CT. Right pleural effusion is loculated. Compressive atelectasis in the left lower lobe. Bandlike opacity in the right lower lobe, lingula and right middle lobe, likely areas of atelectasis. There is peribronchial thickening diffusely. Biapical scarring. Upper Abdomen: Imaging into the upper abdomen shows no acute findings. Musculoskeletal: Chest wall soft tissues are unremarkable. No acute bony abnormality. IMPRESSION: Small to moderate bilateral pleural effusions which have increased in size since prior chest CT. Right pleural effusion is loculated. Areas of atelectasis in the lower lobes bilaterally as well as right middle lobe and lingula.  Peribronchial thickening compatible with bronchitis. Cardiomegaly. Electronically Signed   By: Rolm Baptise M.D.   On: 04/26/2019 21:54   DG Chest Bilateral Decubitus  Result Date: 05/03/2019 CLINICAL DATA:  Pt c/o intermittent SOB. Pleural effusion AND Pulmonary edema. COVID negative 04/26/19. HX HTN, A-fib, nonsmoker EXAM: CHEST - BILATERAL DECUBITUS VIEW COMPARISON:  none FINDINGS: Layering moderate left pleural effusion. Smaller layering component of right effusion. Mild interstitial edema or infiltrates marginally increased. Heart size upper limits normal for technique. Aortic Atherosclerosis (ICD10-170.0). No pneumothorax. IMPRESSION: Moderate left and small right pleural effusions. Electronically Signed   By: Lucrezia Europe M.D.   On: 05/03/2019 08:04   DG Chest Bilateral Decubitus  Result Date: 04/30/2019 CLINICAL DATA:  Heart failure. EXAM: CHEST - BILATERAL DECUBITUS VIEW COMPARISON:  CT chest and chest x-ray dated April 26, 2019. FINDINGS: Bilateral decubitus x-rays demonstrate small layering pleural effusions. Unchanged cardiomegaly and interstitial edema. IMPRESSION: 1. Stable congestive heart failure with interstitial edema and small bilateral pleural effusions. Electronically Signed   By: Titus Dubin M.D.   On: 04/30/2019 10:18   DG Chest Port 1 View  Result Date: 04/26/2019 CLINICAL DATA:  86 year old female with confusion, "Hot flashes" which the patient has complained of when in atrial fibrillation previously. EXAM: PORTABLE CHEST 1 VIEW COMPARISON:  Portable chest 04/17/2019 and earlier. FINDINGS: Portable AP upright view at 1859 hours. Stable cardiomegaly and mediastinal contours. Hazy and patchy opacity in the right lung which might appear similar to that on 04/05/2019 portable chest. But increased streaky right infrahilar opacity. Pulmonary vascularity appears decreased from 04/17/2019. No pneumothorax. Small bilateral pleural effusions seen by CTA on 04/06/2019 have been poorly  visible radiographically. No confluent left lung opacity. Stable visualized osseous structures. Negative visible bowel gas pattern. IMPRESSION: 1. Nonspecific increased streaky right lung opacity since 04/17/2019. Favor increased atelectasis but acute infection is difficult to exclude. 2. Otherwise stable including cardiomegaly, small pleural effusions which were better demonstrated by CTA earlier this month. Electronically Signed   By: Genevie Ann M.D.   On: 04/26/2019 19:08   DG Chest Port 1 View  Result Date: 04/17/2019 CLINICAL DATA:  Weakness. Recent pneumonia. EXAM: PORTABLE CHEST 1 VIEW COMPARISON:  Radiograph 04/05/2019, CT 04/06/2019. [facility]. FINDINGS: Cardiomegaly, similar to slightly progressed from prior. Bilateral pleural effusions with blunting of the costophrenic angles. Worsening interstitial opacities. No pneumothorax. Biapical pleuroparenchymal scarring. IMPRESSION: 1. Findings consistent with CHF. Slight worsening in pulmonary edema and cardiomegaly from prior. 2. Small pleural effusions are similar to prior on AP projection. Electronically Signed   By: Keith Rake M.D.   On: 04/17/2019 19:45     Microbiology: Recent Results (from the past 240 hour(s))  SARS CORONAVIRUS 2 (Shanzay Hepworth 6-24 HRS) Nasopharyngeal Nasopharyngeal Swab     Status: None   Collection Time: 04/26/19 10:52 PM   Specimen: Nasopharyngeal Swab  Result Value Ref Range Status   SARS Coronavirus 2 NEGATIVE NEGATIVE Final    Comment: (NOTE) SARS-CoV-2 target nucleic acids are NOT DETECTED. The SARS-CoV-2 RNA is generally detectable in upper and lower respiratory specimens during the acute phase of infection. Negative results do not preclude SARS-CoV-2 infection, do not rule out co-infections with other pathogens, and should not be used as the sole basis for treatment or other patient management decisions. Negative results must be combined with clinical observations, patient history, and  epidemiological information. The expected result is Negative. Fact Sheet for Patients: SugarRoll.be Fact Sheet for Healthcare Providers: https://www.woods-mathews.com/ This test is not yet approved or cleared by the [place] FDA and  has been authorized for detection and/or diagnosis of SARS-CoV-2 by FDA under an Emergency Use Authorization (EUA). This EUA will remain  in effect (meaning this test can be used) for the duration of the COVID-19 declaration under Section 56 4(b)(1) of the Act, 21 U.S.C. section 360bbb-3(b)(1), unless the authorization is terminated or revoked sooner. Performed at [facility], [place] [address], [place], [place] [address]   MRSA PCR Screening     Status: None   Collection Time: 04/27/19  1:25 AM   Specimen: Nasal Mucosa; Nasopharyngeal  Result Value Ref Range Status   MRSA by PCR NEGATIVE NEGATIVE Final    Comment:        The GeneXpert MRSA Assay (FDA approved for NASAL specimens only), is one component of a comprehensive MRSA colonization surveillance program. It is not intended to diagnose MRSA infection nor to guide or monitor treatment for MRSA infections. Performed at [facility], [address], [place], [place] [address]      Labs: Basic Metabolic Panel: Recent Labs  Lab 04/30/19 [phone] 04/30/19 [id number] 05/01/19 [id number] 05/01/19 [id number] 05/02/19 0625 05/02/19 0625 05/03/19 0434 05/04/19 0536  NA 141  --  140  --  141  --  143 141  K 3.5   < > 3.4*   < > 3.0*   < > 4.2 3.6  CL 106  --  105  --  103  --  107 100  CO2 24  --  26  --  26  --  28 28  GLUCOSE 92  --  83  --  82  --  90 85  BUN 13  --  13  --  15  --  18 19  CREATININE 0.93  --  0.98  --  1.02*  --  1.14* 1.08*  CALCIUM 8.8*  --  8.5*  --  8.5*  --  9.1 8.8*  MG 2.1  --   --   --  1.9  --  2.1 2.1   < > = values in this interval not displayed.   Liver Function Tests: No results for input(s): AST, ALT, ALKPHOS, BILITOT, PROT,  ALBUMIN in the last 168 hours. No results for input(s): LIPASE, AMYLASE in the last 168 hours. No results for input(s): AMMONIA in the last 168 hours. CBC: No results for input(s): WBC, NEUTROABS, HGB, HCT, MCV, PLT in the last 168 hours. Cardiac Enzymes: No results for input(s): CKTOTAL, CKMB, CKMBINDEX, TROPONINI in the last 168 hours. BNP: Invalid input(s): POCBNP CBG: No results for input(s): GLUCAP in the last 168 hours.  Time coordinating discharge:  36 minutes  Signed:  Orson Eva, DO [facility] Pager: [phone] 05/04/2019, 1:58 PM

## 2019-05-04 NOTE — Progress Notes (Signed)
Discharge instructions given to pt. Pt verbalized understanding of new medications and of f/u appointment. Educated on need for recording daily weights and  Monitoring diet. Iv removed from rt ac with cath intact. No acute distress noted.

## 2019-05-04 NOTE — TOC Transition Note (Signed)
Transition of Care [facility]) - CM/SW Discharge Note   Patient Details  Name: Renee Blake MRN: [id number] Date of Birth: 12-07-1928  Transition of [facility]) CM/SW Contact:  Shade Flood, LCSW Phone Number: 05/04/2019, 2:41 PM   Clinical Narrative:     Pt stable for dc today. Plan remains for pt to dc home with granddaughter and [facility]. Spoke with pt's daughter, Marlowe Kays, to update. RN to call granddaughter when pt ready for dc as she will be transporting pt home.  Updated [facility].  There are no other TOC needs for dc.  Final next level of care: Home w Hospice Care Barriers to Discharge: Barriers Resolved   Patient Goals and CMS Choice Patient states their goals for this hospitalization and ongoing recovery are:: family is currently requesting residential hospice as discharge plan CMS Medicare.gov Compare Post Acute Care list provided to:: Patient Represenative (must comment) Choice offered to / list presented to : Adult Children  Discharge Placement                       Discharge Plan and Services In-house Referral: Clinical Social Work Discharge Planning Services: CM Consult Post Acute Care Choice: Hospice                               Social Determinants of Health (SDOH) Interventions     Readmission Risk Interventions Readmission Risk Prevention Plan 04/20/2019 03/15/2019 03/02/2019  Transportation Screening Complete Complete Complete  PCP or Specialist Appt within 5-7 Days - - -  PCP or Specialist Appt within 3-5 Days - - Complete  Home Care Screening - - -  Medication Review (RN CM) - - -  [place] or [place] - Complete Complete  Social Work Consult for [place] Planning/Counseling - Complete Complete  Palliative Care Screening - Not Applicable Complete  Medication Review Press photographer) Complete Complete Complete  PCP or Specialist appointment within 3-5 days of discharge Not Complete - -  Center or Home  Care Consult Complete - -  SW Recovery Care/Counseling Consult Complete - -  Palliative Care Screening Not Complete - -  Skilled Nursing Facility Complete - -  Some recent data might be hidden

## 2019-05-10 DIAGNOSIS — J189 Pneumonia, unspecified organism: Secondary | ICD-10-CM | POA: Diagnosis not present

## 2019-05-10 DIAGNOSIS — I5040 Unspecified combined systolic (congestive) and diastolic (congestive) heart failure: Secondary | ICD-10-CM | POA: Diagnosis not present

## 2019-05-13 ENCOUNTER — Ambulatory Visit: Payer: Medicare Other | Admitting: Cardiovascular Disease

## 2019-05-18 ENCOUNTER — Other Ambulatory Visit: Payer: Self-pay

## 2019-05-18 ENCOUNTER — Encounter: Payer: Self-pay | Admitting: Student

## 2019-05-18 ENCOUNTER — Ambulatory Visit (INDEPENDENT_AMBULATORY_CARE_PROVIDER_SITE_OTHER): Admitting: Student

## 2019-05-18 VITALS — BP 126/78 | HR 89 | Temp 97.2°F | Ht 64.0 in | Wt 113.0 lb

## 2019-05-18 DIAGNOSIS — I10 Essential (primary) hypertension: Secondary | ICD-10-CM | POA: Diagnosis not present

## 2019-05-18 DIAGNOSIS — N183 Chronic kidney disease, stage 3 unspecified: Secondary | ICD-10-CM

## 2019-05-18 DIAGNOSIS — I5032 Chronic diastolic (congestive) heart failure: Secondary | ICD-10-CM | POA: Diagnosis not present

## 2019-05-18 DIAGNOSIS — D649 Anemia, unspecified: Secondary | ICD-10-CM

## 2019-05-18 DIAGNOSIS — I4819 Other persistent atrial fibrillation: Secondary | ICD-10-CM

## 2019-05-18 MED ORDER — TORSEMIDE 20 MG PO TABS: ORAL_TABLET | ORAL | 1 refills | Status: DC

## 2019-05-18 NOTE — Patient Instructions (Signed)
Medication Instructions:  DECREASE TORSEMIDE TO 40 MG IN THE MORNING & 20 MG IN THE EVENING   Labwork: BMET  Testing/Procedures: NONE  Follow-Up: Your physician recommends that you schedule a follow-up appointment in: ALREADY SCHEDULED IN April 2021    Any Other Special Instructions Will Be Listed Below (If Applicable).     If you need a refill on your cardiac medications before your next appointment, please call your pharmacy.

## 2019-05-18 NOTE — Progress Notes (Addendum)
Cardiology Office Note    Date:  05/18/2019   ID:  Renee Blake, DOB March 22, 1929, MRN [id number]  PCP:  [name], MD  Cardiologist: Kate Sable, MD    Chief Complaint  Patient presents with  . Hospitalization Follow-up    History of Present Illness:    Renee Blake is a 86 y.o. female with past medical history of persistent atrial fibrillation, chronic diastolic CHF, HTN, history of GIB, chronic anemia, Stage 3 CKD, and history of anorectal cancer who presents to the office today for hospital follow-up.   She most recently presented to [facility] ED on [facility] from [facility] for evaluation of worsening confusion and "hot flashes" which occurred when in atrial fibrillation with RVR in the past. She was found to have an acute CHF exacerbation with BNP elevated to 947 and Chest CT showing small to moderate bilateral pleural effusions. Weight was elevated to 125 lbs on admission and she had previous discharge weight of 112 lbs in 03/2019. She was started on IV Lasix for diuresis and continued on Cardizem SR 90 mg twice daily and Toprol-XL 50 mg daily as regimen well controlled in the 80's to 90's. She was not on anticoagulation given her history of GIB and chronic anemia. Her weight had declined to 113 lbs on 05/03/2019 and was transitioned to Torsemide 40mg  BID at discharge. Palliative Care was consulted in the setting of failure to thrive and she was discharged with Home Hospice services.  In talking with the patient and her daughter Renee Blake) today, she reports overall doing well since her hospitalization. She has been staying with her niece who has been keeping a very detailed log of her urine output and daily weights. Weight has declined from 114 lbs on her home scales to 111 lbs on most recent check. She denies any recurrent dyspnea on exertion or orthopnea. No recent PND or lower extremity edema. She denies any chest pain or palpitations. Her daughter does mention that she feels  like her mom does not consume enough fluids during the day.  hey have been supplementing with Pedialyte at times along with Boost given her decreased appetite.   Home Hospice has been coming out and checking her vitals weekly and she reports these have overall been "stable".   Past Medical History:  Diagnosis Date  . Acute blood loss anemia   . Atrial fibrillation ([place])    a. diagnosed in 12/2018. Rate-control pursued given not a candidate for anticoagulation  . Chronic diarrhea   . Dilation of biliary tract   . Duodenal stricture   . Esophageal stricture   . Gastric ulcer   . GI bleed   . Hyperlipemia   . Hypertension   . Microscopic colitis   . Nephrolithiasis   . Pancreatic duct dilated   . Schatzki's ring   . Skin cancer   . Sliding hiatal hernia   . Vertigo     Past Surgical History:  Procedure Laterality Date  . BIOPSY  07/28/2017   Procedure: BIOPSY;  Surgeon: Rogene Houston, MD;  [facility];  Service: Endoscopy;;  rectum  . COLONOSCOPY N/A 07/28/2017   Procedure: COLONOSCOPY;  Surgeon: Rogene Houston, MD;  [facility];  Service: Endoscopy;  Laterality: N/A;  . ESOPHAGOGASTRODUODENOSCOPY N/A 02/27/2013   Procedure: ESOPHAGOGASTRODUODENOSCOPY (EGD);  Surgeon: Ladene Artist, MD;  Location: [facility] ENDOSCOPY;  Service: Endoscopy;  Laterality: N/A;  . ESOPHAGOGASTRODUODENOSCOPY N/A 03/28/2013   Procedure: ESOPHAGOGASTRODUODENOSCOPY (EGD);  Surgeon: Rogene Houston, MD;  [facility];  Service: Endoscopy;  Laterality: N/A;  . HOT HEMOSTASIS N/A 02/27/2013   Procedure: HOT HEMOSTASIS (ARGON PLASMA COAGULATION/BICAP);  Surgeon: Ladene Artist, MD;  Location: [facility] ENDOSCOPY;  Service: Endoscopy;  Laterality: N/A;  . NOSE SURGERY     for skin cancer  . POLYPECTOMY  07/28/2017   Procedure: POLYPECTOMY;  Surgeon: Rogene Houston, MD;  [facility];  Service: Endoscopy;;  colon     Current Medications: Outpatient Medications Prior  to Visit  Medication Sig Dispense Refill  . acetaminophen (TYLENOL) 325 MG tablet Take 650 mg by mouth every 6 (six) hours as needed for mild pain or moderate pain.    Marland Kitchen ALPRAZolam (XANAX) 0.25 MG tablet Take 1 tablet (0.25 mg total) by mouth 2 (two) times daily as needed. for anxiety 8 tablet 0  . dicyclomine (BENTYL) 10 MG capsule Take 10 mg by mouth every 4 (four) hours as needed for spasms.    Marland Kitchen diltiazem (CARDIZEM SR) 90 MG 12 hr capsule Take 1 capsule (90 mg total) by mouth 2 (two) times daily. 60 capsule 0  . levocetirizine (XYZAL) 5 MG tablet Take 5 mg by mouth every evening.    Marland Kitchen levothyroxine (SYNTHROID) 25 MCG tablet Take 25 mcg by mouth daily before breakfast.    . meclizine (ANTIVERT) 25 MG tablet Take 25 mg by mouth daily as needed for dizziness.     . metoprolol succinate (TOPROL-XL) 50 MG 24 hr tablet Take 1 tablet (50 mg total) by mouth every morning. Take with or immediately following a meal. 90 tablet 3  . Multiple Vitamin (MULTIVITAMIN WITH MINERALS) TABS tablet Take 1 tablet by mouth daily.    . ondansetron (ZOFRAN) 4 MG tablet Take 4 mg by mouth every 8 (eight) hours as needed for nausea or vomiting.    . potassium chloride SA (KLOR-CON) 20 MEQ tablet Take 20 mEq by mouth daily.    . pregabalin (LYRICA) 25 MG capsule Take 1 capsule (25 mg total) by mouth 2 (two) times daily. 60 capsule 1  . vitamin B-12 (CYANOCOBALAMIN) 500 MCG tablet Take 1 tablet (500 mcg total) by mouth daily.    Marland Kitchen torsemide (DEMADEX) 20 MG tablet Take 2 tablets (40 mg total) by mouth 2 (two) times daily. 60 tablet 1   No facility-administered medications prior to visit.     Allergies:   Clonazepam, Codeine, and Sulfa antibiotics   Social History   Socioeconomic History  . Marital status: Widowed    Spouse name: Not on file  . Number of children: Not on file  . Years of education: Not on file  . Highest education level: Not on file  Occupational History  . Not on file  Tobacco Use  . Smoking  status: Never Smoker  . Smokeless tobacco: Never Used  Substance and Sexual Activity  . Alcohol use: No    Alcohol/week: 0.0 standard drinks  . Drug use: No  . Sexual activity: Not on file  Other Topics Concern  . Not on file  Social History Narrative  . Not on file   Social Determinants of Health   Financial Resource Strain:   . Difficulty of Paying Living Expenses: Not on file  Food Insecurity:   . Worried About Charity fundraiser in the Last Year: Not on file  . Ran Out of Food in the Last Year: Not on file  Transportation Needs:   . Lack of  Transportation (Medical): Not on file  . Lack of Transportation (Non-Medical): Not on file  Physical Activity:   . Days of Exercise per Week: Not on file  . Minutes of Exercise per Session: Not on file  Stress:   . Feeling of Stress : Not on file  Social Connections:   . Frequency of Communication with Friends and Family: Not on file  . Frequency of Social Gatherings with Friends and Family: Not on file  . Attends Religious Services: Not on file  . Active Member of Clubs or Organizations: Not on file  . Attends Archivist Meetings: Not on file  . Marital Status: Not on file     Family History:  The patient's family history includes Heart attack in her father.   Review of Systems:   Please see the history of present illness.     General:  No chills, fever, night sweats or weight changes.  Cardiovascular:  No chest pain, edema, orthopnea, palpitations, paroxysmal nocturnal dyspnea. Positive for dyspnea on exertion (improved).  Dermatological: No rash, lesions/masses Respiratory: No cough, dyspnea Urologic: No hematuria, dysuria Abdominal:   No nausea, vomiting, diarrhea, bright red blood per rectum, melena, or hematemesis Neurologic:  No visual changes, wkns, changes in mental status. All other systems reviewed and are otherwise negative except as noted above.   Physical Exam:    VS:  BP 126/78   Pulse 89   Temp  (!) 97.2 F (36.2 C)   Ht 5\' 4"  (1.626 m)   Wt 113 lb (51.3 kg)   BMI 19.40 kg/m    General: Well developed, well nourished,female appearing in no acute distress. Head: Normocephalic, atraumatic, sclera non-icteric, no xanthomas, nares are without discharge.  Neck: No carotid bruits. JVD not elevated.  Lungs: Respirations regular and unlabored, without wheezes or rales.  Heart: Irregularly irregular. No S3 or S4.  No murmur, no rubs, or gallops appreciated. Abdomen: Soft, non-tender, non-distended with normoactive bowel sounds. No hepatomegaly. No rebound/guarding. No obvious abdominal masses. Msk:  Strength and tone appear normal for age. No joint deformities or effusions. Extremities: No clubbing or cyanosis. No lower extremity edema.  Distal pedal pulses are 2+ bilaterally. Neuro: Alert and oriented X 3. Moves all extremities spontaneously. No focal deficits noted. Psych:  Responds to questions appropriately with a normal affect. Skin: No rashes or lesions noted  Wt Readings from Last 3 Encounters:  05/18/19 113 lb (51.3 kg)  05/04/19 109 lb 9.1 oz (49.7 kg)  04/17/19 114 lb (51.7 kg)     Studies/Labs Reviewed:   EKG:  EKG is not ordered today. EKG from 04/26/2019 is reviewed which shows rate-controlled atrial fibrillation, heart rate 95 with RBBB and LAFB.  Recent Labs: 03/18/2019: TSH 13.447 04/26/2019: ALT 82; B Natriuretic Peptide 947.0; Hemoglobin 10.3; Platelets 292 05/04/2019: BUN 19; Creatinine, Ser 1.08; Magnesium 2.1; Potassium 3.6; Sodium 141   Lipid Panel No results found for: CHOL, TRIG, HDL, CHOLHDL, VLDL, LDLCALC, LDLDIRECT  Additional studies/ records that were reviewed today include:   Echocardiogram: 12/2018 IMPRESSIONS    1. Left ventricular ejection fraction, by visual estimation, is 60 to  65%. The left ventricle has normal function. There is no left ventricular  hypertrophy.  2. Elevated left atrial pressure.  3. Left ventricular diastolic  parameters are consistent with Grade II  diastolic dysfunction (pseudonormalization).  4. Global right ventricle has normal systolic function.The right  ventricular size is normal. No increase in right ventricular wall  thickness.  5. Left atrial  size was severely dilated.  6. Right atrial size was mildly dilated.  7. The mitral valve is abnormal. Moderate mitral valve regurgitation. No  evidence of mitral stenosis.  8. The MR vena contracta is 0.6 cm. The MV/AV TVI ratio is 1.1. Findings  support moderate mitral regurgitation.  9. The tricuspid valve is normal in structure. Tricuspid valve  regurgitation is mild.  10. The aortic valve is tricuspid. Aortic valve regurgitation is not  visualized. No evidence of aortic valve sclerosis or stenosis.  11. The pulmonic valve was not well visualized. Pulmonic valve  regurgitation is mild.  12. Moderately elevated pulmonary artery systolic pressure.  13. The inferior vena cava is normal in size with greater than 50%  respiratory variability, suggesting right atrial pressure of 3 mmHg.   Assessment:    1. Chronic diastolic heart failure (HCC)   2. Persistent atrial fibrillation ([place])   3. Essential hypertension   4. Anemia, unspecified type   5. Stage 3 chronic kidney disease, unspecified whether stage 3a or 3b CKD      Plan:   In order of problems listed above:  1. Chronic Diastolic CHF - She was recently hospitalized for an acute CHF exacerbation and responded well to IV Lasix during admission. She had been on Lasix 40mg  in AM/20mg  in PM prior to admission but was transitioned to Torsemide at the time of discharge and this was started at 40 mg twice daily.  Given her limited fluid intake, I am concerned about dehydration with this current dosing. She does not appear volume overloaded by examination and weight has declined by 3 pounds on her home scales since discharge. Will plan to preemptively reduce Torsemide to 40mg  in AM/20mg   in PM. Will obtain a repeat BMET. If renal function has worsened since hospital discharge, would then reduce to 40mg  once daily.   2. Persistent Atrial Fibrillation - She denies any recent palpitations and heart rate has been well controlled when checked at home. Continue current rate controlling medications with Cardizem SR 90 mg twice daily and Toprol-XL 50 mg daily. - She is not on anticoagulation given her chronic anemia and history of GIB.   3. HTN - BP is well controlled at 126/78 during today's visit. Continue current medication regimen.  4. Anemia - Hgb stable at 10.3 on most recent check last month. She denies any evidence of active bleeding.  5. Stage 3 CKD - Baseline creatinine 1.1-1.2. Stable at 1.08 on 05/04/2019. Recheck BMET given recent change in diuretic regimen.    Medication Adjustments/Labs and Tests Ordered: Current medicines are reviewed at length with the patient today.  Concerns regarding medicines are outlined above.  Medication changes, Labs and Tests ordered today are listed in the Patient Instructions below. Patient Instructions  Medication Instructions:  DECREASE TORSEMIDE TO 40 MG IN THE MORNING & 20 MG IN THE EVENING   Labwork: BMET  Testing/Procedures: NONE  Follow-Up: Your physician recommends that you schedule a follow-up appointment in: ALREADY SCHEDULED IN April 2021    Any Other Special Instructions Will Be Listed Below (If Applicable).     If you need a refill on your cardiac medications before your next appointment, please call your pharmacy.      Signed, Erma Heritage, PA-C  05/18/2019 3:13 PM    [facility] [address] [place], [place] [address] Phone: [phone] Fax: [phone]

## 2019-05-26 DIAGNOSIS — I10 Essential (primary) hypertension: Secondary | ICD-10-CM | POA: Diagnosis not present

## 2019-05-26 DIAGNOSIS — D649 Anemia, unspecified: Secondary | ICD-10-CM | POA: Diagnosis not present

## 2019-05-26 DIAGNOSIS — E785 Hyperlipidemia, unspecified: Secondary | ICD-10-CM | POA: Diagnosis not present

## 2019-05-26 DIAGNOSIS — N189 Chronic kidney disease, unspecified: Secondary | ICD-10-CM | POA: Diagnosis not present

## 2019-05-26 DIAGNOSIS — D509 Iron deficiency anemia, unspecified: Secondary | ICD-10-CM | POA: Diagnosis not present

## 2019-05-31 DIAGNOSIS — K219 Gastro-esophageal reflux disease without esophagitis: Secondary | ICD-10-CM | POA: Diagnosis not present

## 2019-05-31 DIAGNOSIS — I10 Essential (primary) hypertension: Secondary | ICD-10-CM | POA: Diagnosis not present

## 2019-05-31 DIAGNOSIS — D509 Iron deficiency anemia, unspecified: Secondary | ICD-10-CM | POA: Diagnosis not present

## 2019-05-31 DIAGNOSIS — E875 Hyperkalemia: Secondary | ICD-10-CM | POA: Diagnosis not present

## 2019-06-01 DIAGNOSIS — J9621 Acute and chronic respiratory failure with hypoxia: Secondary | ICD-10-CM | POA: Diagnosis not present

## 2019-06-01 DIAGNOSIS — K219 Gastro-esophageal reflux disease without esophagitis: Secondary | ICD-10-CM | POA: Diagnosis not present

## 2019-06-01 DIAGNOSIS — Z0000 Encounter for general adult medical examination without abnormal findings: Secondary | ICD-10-CM | POA: Diagnosis not present

## 2019-06-01 DIAGNOSIS — I959 Hypotension, unspecified: Secondary | ICD-10-CM | POA: Diagnosis not present

## 2019-06-01 DIAGNOSIS — D509 Iron deficiency anemia, unspecified: Secondary | ICD-10-CM | POA: Diagnosis not present

## 2019-06-01 DIAGNOSIS — I5033 Acute on chronic diastolic (congestive) heart failure: Secondary | ICD-10-CM | POA: Diagnosis not present

## 2019-06-01 DIAGNOSIS — E039 Hypothyroidism, unspecified: Secondary | ICD-10-CM | POA: Diagnosis not present

## 2019-06-01 DIAGNOSIS — E875 Hyperkalemia: Secondary | ICD-10-CM | POA: Diagnosis not present

## 2019-06-07 DIAGNOSIS — I5040 Unspecified combined systolic (congestive) and diastolic (congestive) heart failure: Secondary | ICD-10-CM | POA: Diagnosis not present

## 2019-06-07 DIAGNOSIS — J189 Pneumonia, unspecified organism: Secondary | ICD-10-CM | POA: Diagnosis not present

## 2019-07-08 DIAGNOSIS — I5040 Unspecified combined systolic (congestive) and diastolic (congestive) heart failure: Secondary | ICD-10-CM | POA: Diagnosis not present

## 2019-07-08 DIAGNOSIS — J189 Pneumonia, unspecified organism: Secondary | ICD-10-CM | POA: Diagnosis not present

## 2019-07-27 ENCOUNTER — Telehealth (INDEPENDENT_AMBULATORY_CARE_PROVIDER_SITE_OTHER): Payer: Medicare Other | Admitting: Cardiovascular Disease

## 2019-07-27 ENCOUNTER — Encounter: Payer: Self-pay | Admitting: *Deleted

## 2019-07-27 ENCOUNTER — Encounter: Payer: Self-pay | Admitting: Cardiovascular Disease

## 2019-07-27 VITALS — BP 123/62 | HR 71 | Wt 112.0 lb

## 2019-07-27 DIAGNOSIS — I10 Essential (primary) hypertension: Secondary | ICD-10-CM

## 2019-07-27 DIAGNOSIS — D649 Anemia, unspecified: Secondary | ICD-10-CM

## 2019-07-27 DIAGNOSIS — I5032 Chronic diastolic (congestive) heart failure: Secondary | ICD-10-CM

## 2019-07-27 DIAGNOSIS — N183 Chronic kidney disease, stage 3 unspecified: Secondary | ICD-10-CM

## 2019-07-27 DIAGNOSIS — I4819 Other persistent atrial fibrillation: Secondary | ICD-10-CM

## 2019-07-27 NOTE — Patient Instructions (Signed)
Medication Instructions:  Continue all current medications.  Labwork: none  Testing/Procedures: none  Follow-Up: 3 months   Any Other Special Instructions Will Be Listed Below (If Applicable).  If you need a refill on your cardiac medications before your next appointment, please call your pharmacy.  

## 2019-07-27 NOTE — Progress Notes (Signed)
Virtual Visit via Telephone Note   This visit type was conducted due to national recommendations for restrictions regarding the COVID-19 Pandemic (e.g. social distancing) in an effort to limit this patient's exposure and mitigate transmission in our community.  Due to her co-morbid illnesses, this patient is at least at moderate risk for complications without adequate follow up.  This format is felt to be most appropriate for this patient at this time.  The patient did not have access to video technology/had technical difficulties with video requiring transitioning to audio format only (telephone).  All issues noted in this document were discussed and addressed.  No physical exam could be performed with this format.  Please refer to the patient's chart for her  consent to telehealth for [facility].   The patient was identified using 2 identifiers.  Date:  07/27/2019   ID:  Renee Blake, DOB [birth year], MRN [id number]  Patient Location: Home Provider Location: Office  PCP:  [name], MD  Cardiologist:  Kate Sable, MD  Electrophysiologist:  None   Evaluation Performed:  Follow-Up Visit  Chief Complaint: Routine follow-up  History of Present Illness:    Renee Blake is a 86 y.o. female with past medical history of persistent atrial fibrillation, chronic diastolic CHF, HTN, history of GIB, chronic anemia, Stage 3 CKD, and history of anorectal cancer.  She was hospitalized in late January 2021 with worsening confusion and found to be in rapid atrial fibrillation and acute CHF.  She was also noted to be in rapid atrial fibrillation.  She was not on anticoagulation given prior history of GI bleeding and chronic anemia.  She was transitioned to torsemide 40 mg twice daily at discharge.  Palliative care was consulted in the setting of failure to thrive and she was discharged with home hospice services.  I spoke to her granddaughter today, Renee Blake, who is helping  to take care of her.  The patient has been stable. She has been using her oxygen a little more. Hospice informed she could increase O2 as needed. She now uses up to 3L.  Her legs are not swollen. They had been swelling considerably prior to this. Hospice told her she could take an extra tablet of torsemide prn.  She has been more confused lately and cannot remember her name at times nor the day of the week.  The hospice nurse Renee Blake) comes to the house every Tuesday.  Renee Blake weighs her daily.   Past Medical History:  Diagnosis Date  . Acute blood loss anemia   . Atrial fibrillation ([place])    a. diagnosed in 12/2018. Rate-control pursued given not a candidate for anticoagulation  . Chronic diarrhea   . Dilation of biliary tract   . Duodenal stricture   . Esophageal stricture   . Gastric ulcer   . GI bleed   . Hyperlipemia   . Hypertension   . Microscopic colitis   . Nephrolithiasis   . Pancreatic duct dilated   . Schatzki's ring   . Skin cancer   . Sliding hiatal hernia   . Vertigo    Past Surgical History:  Procedure Laterality Date  . BIOPSY  07/28/2017   Procedure: BIOPSY;  Surgeon: Rogene Houston, MD;  [facility];  Service: Endoscopy;;  rectum  . COLONOSCOPY N/A 07/28/2017   Procedure: COLONOSCOPY;  Surgeon: Rogene Houston, MD;  [facility];  Service: Endoscopy;  Laterality: N/A;  . ESOPHAGOGASTRODUODENOSCOPY N/A 02/27/2013  Procedure: ESOPHAGOGASTRODUODENOSCOPY (EGD);  Surgeon: Ladene Artist, MD;  Location: [facility] ENDOSCOPY;  Service: Endoscopy;  Laterality: N/A;  . ESOPHAGOGASTRODUODENOSCOPY N/A 03/28/2013   Procedure: ESOPHAGOGASTRODUODENOSCOPY (EGD);  Surgeon: Rogene Houston, MD;  [facility];  Service: Endoscopy;  Laterality: N/A;  . HOT HEMOSTASIS N/A 02/27/2013   Procedure: HOT HEMOSTASIS (ARGON PLASMA COAGULATION/BICAP);  Surgeon: Ladene Artist, MD;  Location: [facility] ENDOSCOPY;  Service: Endoscopy;  Laterality: N/A;  . NOSE  SURGERY     for skin cancer  . POLYPECTOMY  07/28/2017   Procedure: POLYPECTOMY;  Surgeon: Rogene Houston, MD;  [facility];  Service: Endoscopy;;  colon      Current Meds  Medication Sig  . acetaminophen (TYLENOL) 325 MG tablet Take 650 mg by mouth every 6 (six) hours as needed for mild pain or moderate pain.  Marland Kitchen ALPRAZolam (XANAX) 0.25 MG tablet Take 1 tablet (0.25 mg total) by mouth 2 (two) times daily as needed. for anxiety  . dicyclomine (BENTYL) 10 MG capsule Take 10 mg by mouth every 4 (four) hours as needed for spasms.  Marland Kitchen diltiazem (CARDIZEM SR) 90 MG 12 hr capsule Take 1 capsule (90 mg total) by mouth 2 (two) times daily.  Marland Kitchen levocetirizine (XYZAL) 5 MG tablet Take 5 mg by mouth every evening.  Marland Kitchen levothyroxine (SYNTHROID) 25 MCG tablet Take 25 mcg by mouth daily before breakfast.  . meclizine (ANTIVERT) 25 MG tablet Take 25 mg by mouth daily as needed for dizziness.   . metoprolol succinate (TOPROL-XL) 50 MG 24 hr tablet Take 1 tablet (50 mg total) by mouth every morning. Take with or immediately following a meal.  . Multiple Vitamin (MULTIVITAMIN WITH MINERALS) TABS tablet Take 1 tablet by mouth daily.  . ondansetron (ZOFRAN) 4 MG tablet Take 4 mg by mouth every 8 (eight) hours as needed for nausea or vomiting.  Marland Kitchen oxyCODONE-acetaminophen (PERCOCET/ROXICET) 5-325 MG tablet Take 1-2 tablets by mouth as needed.  . potassium chloride SA (KLOR-CON) 20 MEQ tablet Take 20 mEq by mouth daily.  . pregabalin (LYRICA) 25 MG capsule Take 1 capsule (25 mg total) by mouth 2 (two) times daily.  Marland Kitchen torsemide (DEMADEX) 20 MG tablet Take 40 mg in the AM & Take 20 mg in the PM.  . vitamin B-12 (CYANOCOBALAMIN) 500 MCG tablet Take 1 tablet (500 mcg total) by mouth daily.     Allergies:   Clonazepam, Codeine, and Sulfa antibiotics   Social History   Tobacco Use  . Smoking status: Never Smoker  . Smokeless tobacco: Never Used  Substance Use Topics  . Alcohol use: No    Alcohol/week:  0.0 standard drinks  . Drug use: No     Family Hx: The patient's family history includes Heart attack in her father.  ROS:   Please see the history of present illness.     All other systems reviewed and are negative.   Prior CV studies:   The following studies were reviewed today:  Echocardiogram: 12/2018 IMPRESSIONS    1. Left ventricular ejection fraction, by visual estimation, is 60 to  65%. The left ventricle has normal function. There is no left ventricular  hypertrophy.  2. Elevated left atrial pressure.  3. Left ventricular diastolic parameters are consistent with Grade II  diastolic dysfunction (pseudonormalization).  4. Global right ventricle has normal systolic function.The right  ventricular size is normal. No increase in right ventricular wall  thickness.  5. Left atrial size was severely dilated.  6.  Right atrial size was mildly dilated.  7. The mitral valve is abnormal. Moderate mitral valve regurgitation. No  evidence of mitral stenosis.  8. The MR vena contracta is 0.6 cm. The MV/AV TVI ratio is 1.1. Findings  support moderate mitral regurgitation.  9. The tricuspid valve is normal in structure. Tricuspid valve  regurgitation is mild.  10. The aortic valve is tricuspid. Aortic valve regurgitation is not  visualized. No evidence of aortic valve sclerosis or stenosis.  11. The pulmonic valve was not well visualized. Pulmonic valve  regurgitation is mild.  12. Moderately elevated pulmonary artery systolic pressure.  13. The inferior vena cava is normal in size with greater than 50%  respiratory variability, suggesting right atrial pressure of 3 mmHg.   Labs/Other Tests and Data Reviewed:    EKG:  No ECG reviewed.  Recent Labs: 03/18/2019: TSH 13.447 04/26/2019: ALT 82; B Natriuretic Peptide 947.0; Hemoglobin 10.3; Platelets 292 05/04/2019: BUN 19; Creatinine, Ser 1.08; Magnesium 2.1; Potassium 3.6; Sodium 141   Recent Lipid Panel No results  found for: CHOL, TRIG, HDL, CHOLHDL, LDLCALC, LDLDIRECT  Wt Readings from Last 3 Encounters:  07/27/19 112 lb (50.8 kg)  05/18/19 113 lb (51.3 kg)  05/04/19 109 lb 9.1 oz (49.7 kg)     Objective:    Vital Signs:  BP 123/62   Pulse 71   Wt 112 lb (50.8 kg)   BMI 19.22 kg/m    VITAL SIGNS:  reviewed  ASSESSMENT & PLAN:    1.  Chronic diastolic heart failure: Currently on torsemide 40 mg every morning and 20 mg every afternoon.  Weight is stable.  Granddaughter weighs her daily. Basic metabolic panel ordered at 05/18/2019. Granddaughter said it was performed at [facility] office.  I will request a copy for personal review.  If renal function has declined since hospitalization, torsemide should be reduced to 40 mg daily.   2.  Persistent atrial fibrillation: Symptomatically stable on Cardizem SR 90 mg twice daily and Toprol-XL 50 mg daily.  She is not on anticoagulation given chronic anemia and history of GI bleeding.  3.  Hypertension: Blood pressure is normal.  No changes to therapy.  4.  Anemia: Hemoglobin 10.3 on 04/26/2019.  5.  Chronic kidney disease stage III: Baseline creatinine is in 1.1-1.2 range.  It was 1.08 on 05/04/2019. Basic metabolic panel ordered at 05/18/2019. Granddaughter said it was performed at [facility] office.  I request a copy for personal review.  If renal function has declined since hospitalization, torsemide should be reduced to 40 mg daily.     COVID-19 Education: The signs and symptoms of COVID-19 were discussed with the patient and how to seek care for testing (follow up with PCP or arrange E-visit).  The importance of social distancing was discussed today.  Time:   Today, I have spent 20 minutes with the patient with telehealth technology discussing the above problems.     Medication Adjustments/Labs and Tests Ordered: Current medicines are reviewed at length with the patient today.  Concerns regarding medicines are outlined above.   Tests Ordered: No orders  of the defined types were placed in this encounter.   Medication Changes: No orders of the defined types were placed in this encounter.   Follow Up:  Virtual Visit  in 3 month(s)  Signed, Kate Sable, MD  07/27/2019 11:32 AM    [place]

## 2019-08-07 DIAGNOSIS — J189 Pneumonia, unspecified organism: Secondary | ICD-10-CM | POA: Diagnosis not present

## 2019-08-07 DIAGNOSIS — I5040 Unspecified combined systolic (congestive) and diastolic (congestive) heart failure: Secondary | ICD-10-CM | POA: Diagnosis not present

## 2019-08-24 DIAGNOSIS — R5381 Other malaise: Secondary | ICD-10-CM | POA: Diagnosis not present

## 2019-08-24 DIAGNOSIS — R69 Illness, unspecified: Secondary | ICD-10-CM | POA: Diagnosis not present

## 2019-08-24 DIAGNOSIS — Z743 Need for continuous supervision: Secondary | ICD-10-CM | POA: Diagnosis not present

## 2019-08-24 DIAGNOSIS — Z7401 Bed confinement status: Secondary | ICD-10-CM | POA: Diagnosis not present

## 2019-08-25 DIAGNOSIS — I510 Cardiac septal defect, acquired: Secondary | ICD-10-CM | POA: Diagnosis not present

## 2019-08-25 DIAGNOSIS — M6281 Muscle weakness (generalized): Secondary | ICD-10-CM | POA: Diagnosis not present

## 2019-08-26 DIAGNOSIS — N189 Chronic kidney disease, unspecified: Secondary | ICD-10-CM | POA: Diagnosis not present

## 2019-08-26 DIAGNOSIS — I482 Chronic atrial fibrillation, unspecified: Secondary | ICD-10-CM | POA: Diagnosis not present

## 2019-08-26 DIAGNOSIS — I5032 Chronic diastolic (congestive) heart failure: Secondary | ICD-10-CM | POA: Diagnosis not present

## 2019-08-26 DIAGNOSIS — K529 Noninfective gastroenteritis and colitis, unspecified: Secondary | ICD-10-CM | POA: Diagnosis not present

## 2019-09-07 DIAGNOSIS — I5040 Unspecified combined systolic (congestive) and diastolic (congestive) heart failure: Secondary | ICD-10-CM | POA: Diagnosis not present

## 2019-09-07 DIAGNOSIS — J189 Pneumonia, unspecified organism: Secondary | ICD-10-CM | POA: Diagnosis not present

## 2019-10-18 ENCOUNTER — Other Ambulatory Visit: Payer: Self-pay

## 2019-10-18 ENCOUNTER — Emergency Department (HOSPITAL_COMMUNITY)
Admission: EM | Admit: 2019-10-18 | Discharge: 2019-10-18 | Disposition: A | Discharge status: Home / Self Care | Payer: Medicare Other | Attending: Emergency Medicine | Admitting: Emergency Medicine

## 2019-10-18 ENCOUNTER — Encounter (HOSPITAL_COMMUNITY): Payer: Self-pay | Admitting: Emergency Medicine

## 2019-10-18 ENCOUNTER — Emergency Department (HOSPITAL_COMMUNITY): Payer: Medicare Other

## 2019-10-18 DIAGNOSIS — R531 Weakness: Secondary | ICD-10-CM | POA: Insufficient documentation

## 2019-10-18 DIAGNOSIS — R42 Dizziness and giddiness: Secondary | ICD-10-CM | POA: Diagnosis not present

## 2019-10-18 DIAGNOSIS — E860 Dehydration: Secondary | ICD-10-CM | POA: Diagnosis not present

## 2019-10-18 DIAGNOSIS — I4891 Unspecified atrial fibrillation: Secondary | ICD-10-CM | POA: Diagnosis not present

## 2019-10-18 DIAGNOSIS — Z79899 Other long term (current) drug therapy: Secondary | ICD-10-CM | POA: Diagnosis not present

## 2019-10-18 DIAGNOSIS — I130 Hypertensive heart and chronic kidney disease with heart failure and stage 1 through stage 4 chronic kidney disease, or unspecified chronic kidney disease: Secondary | ICD-10-CM | POA: Diagnosis not present

## 2019-10-18 DIAGNOSIS — N1832 Chronic kidney disease, stage 3b: Secondary | ICD-10-CM | POA: Diagnosis not present

## 2019-10-18 DIAGNOSIS — I5032 Chronic diastolic (congestive) heart failure: Secondary | ICD-10-CM | POA: Insufficient documentation

## 2019-10-18 DIAGNOSIS — E1122 Type 2 diabetes mellitus with diabetic chronic kidney disease: Secondary | ICD-10-CM | POA: Diagnosis not present

## 2019-10-18 DIAGNOSIS — R0902 Hypoxemia: Secondary | ICD-10-CM | POA: Diagnosis not present

## 2019-10-18 DIAGNOSIS — R55 Syncope and collapse: Secondary | ICD-10-CM | POA: Diagnosis present

## 2019-10-18 DIAGNOSIS — I959 Hypotension, unspecified: Secondary | ICD-10-CM | POA: Diagnosis not present

## 2019-10-18 DIAGNOSIS — R5381 Other malaise: Secondary | ICD-10-CM | POA: Diagnosis not present

## 2019-10-18 LAB — BASIC METABOLIC PANEL
Anion gap: 11 (ref 5–15)
BUN: 22 mg/dL (ref 8–23)
CO2: 28 mmol/L (ref 22–32)
Calcium: 9.1 mg/dL (ref 8.9–10.3)
Chloride: 100 mmol/L (ref 98–111)
Creatinine, Ser: 1.11 mg/dL — ABNORMAL HIGH (ref 0.44–1.00)
GFR calc Af Amer: 51 mL/min — ABNORMAL LOW (ref >60)
GFR calc non Af Amer: 44 mL/min — ABNORMAL LOW (ref >60)
Glucose, Bld: 93 mg/dL (ref 70–99)
Potassium: 4.1 mmol/L (ref 3.5–5.1)
Sodium: 139 mmol/L (ref 135–145)

## 2019-10-18 LAB — HEPATIC FUNCTION PANEL
ALT: 33 U/L (ref 0–44)
AST: 34 U/L (ref 15–41)
Albumin: 3.9 g/dL (ref 3.5–5.0)
Alkaline Phosphatase: 89 U/L (ref 38–126)
Bilirubin, Direct: <0.1 mg/dL (ref 0.0–0.2)
Total Bilirubin: 0.4 mg/dL (ref 0.3–1.2)
Total Protein: 7.7 g/dL (ref 6.5–8.1)

## 2019-10-18 LAB — DIFFERENTIAL
Abs Immature Granulocytes: 0.02 10*3/uL (ref 0.00–0.07)
Basophils Absolute: 0 10*3/uL (ref 0.0–0.1)
Basophils Relative: 0 %
Eosinophils Absolute: 0.1 10*3/uL (ref 0.0–0.5)
Eosinophils Relative: 2 %
Immature Granulocytes: 0 %
Lymphocytes Relative: 17 %
Lymphs Abs: 1 10*3/uL (ref 0.7–4.0)
Monocytes Absolute: 0.5 10*3/uL (ref 0.1–1.0)
Monocytes Relative: 8 %
Neutro Abs: 4.1 10*3/uL (ref 1.7–7.7)
Neutrophils Relative %: 73 %

## 2019-10-18 LAB — CBC
HCT: 37.5 % (ref 36.0–46.0)
Hemoglobin: 11.9 g/dL — ABNORMAL LOW (ref 12.0–15.0)
MCH: 30.2 pg (ref 26.0–34.0)
MCHC: 31.7 g/dL (ref 30.0–36.0)
MCV: 95.2 fL (ref 80.0–100.0)
Platelets: 239 10*3/uL (ref 150–400)
RBC: 3.94 MIL/uL (ref 3.87–5.11)
RDW: 15 % (ref 11.5–15.5)
WBC: 5.7 10*3/uL (ref 4.0–10.5)
nRBC: 0 % (ref 0.0–0.2)

## 2019-10-18 LAB — TROPONIN I (HIGH SENSITIVITY)
Troponin I (High Sensitivity): 5 ng/L (ref <18)
Troponin I (High Sensitivity): 5 ng/L (ref <18)

## 2019-10-18 LAB — LACTIC ACID, PLASMA: Lactic Acid, Venous: 1.1 mmol/L (ref 0.5–1.9)

## 2019-10-18 IMAGING — CT CT HEAD W/O CM
4 series · 17 of 47 positions shown, 19 images · non-contrast
Comparison: [DATE] head CT.

CLINICAL DATA: Ataxia, stroke suspected.

EXAM:
CT HEAD WITHOUT CONTRAST
TECHNIQUE: Contiguous axial images were obtained from the base of the skull
through the vertex without intravenous contrast.

[Series 2: head w o · axial · 0.41mm/px · z∈[+58,+173]mm · 7 of 31 slices shown, 9 images]
[im 4/31  brain]
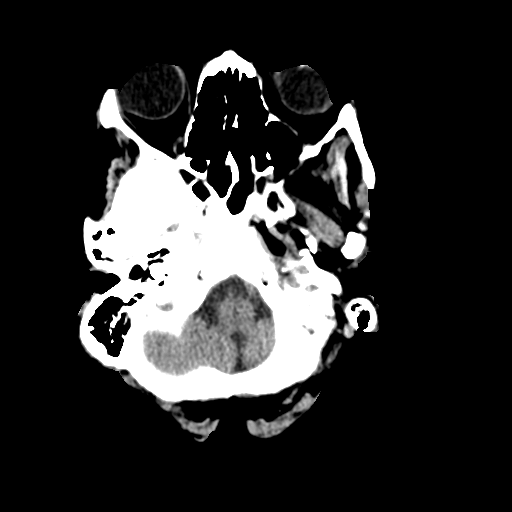
[im 4/31  bone]
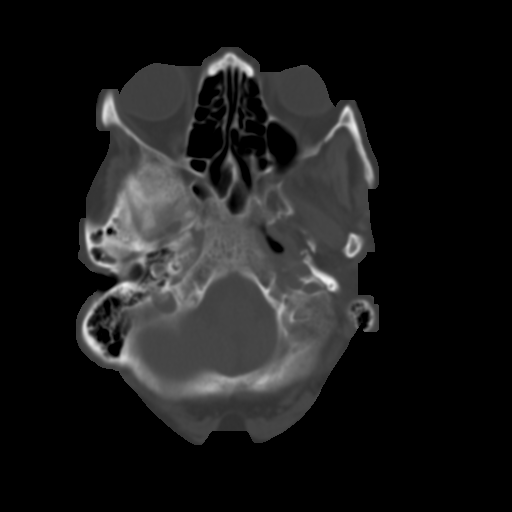
[im 8/31  brain]
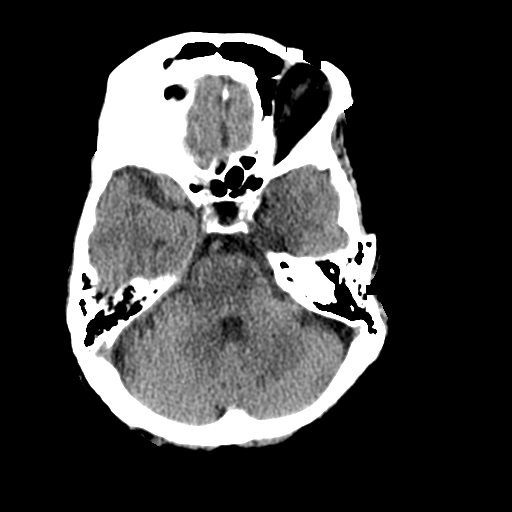
[im 12/31  brain]
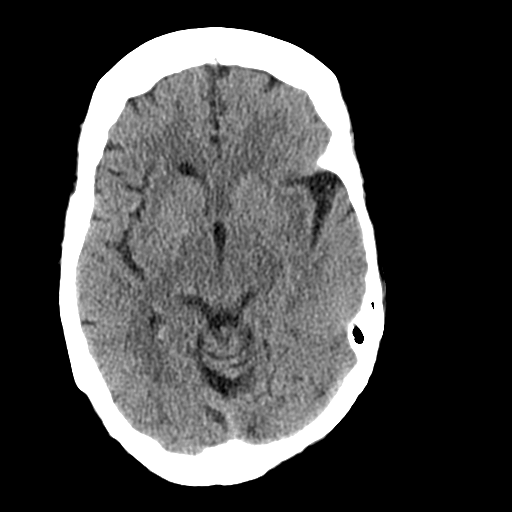
[im 16/31  brain]
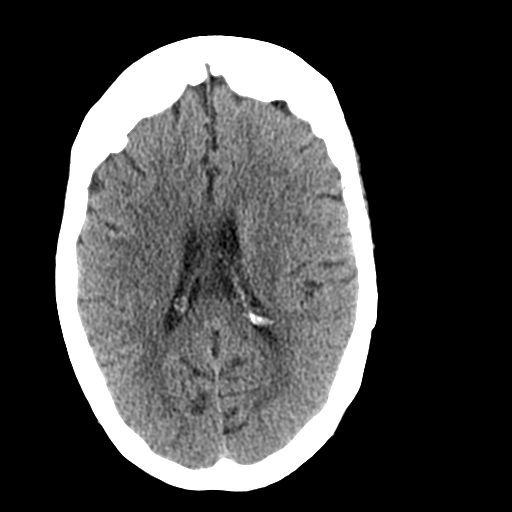
[im 19/31  brain]
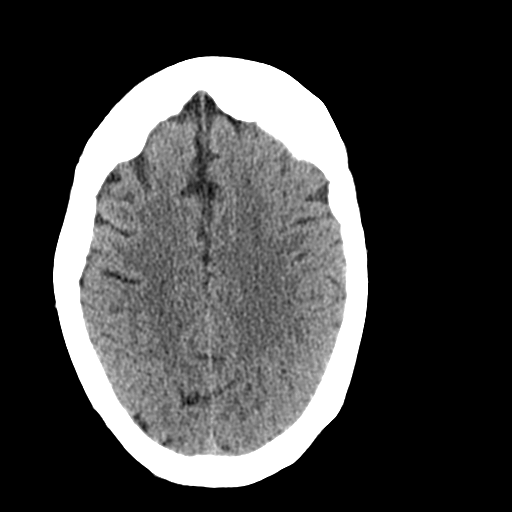
[im 19/31  bone]
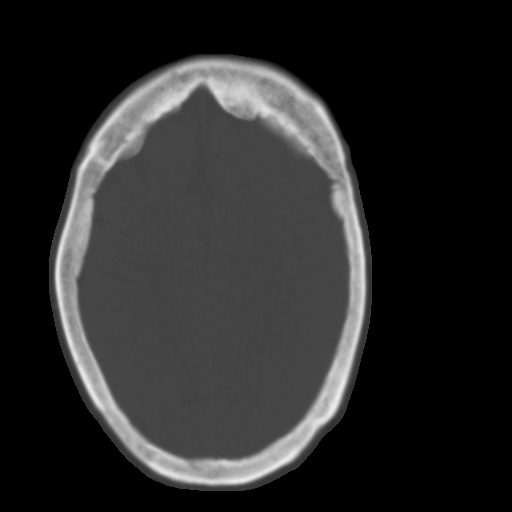
[im 23/31  brain]
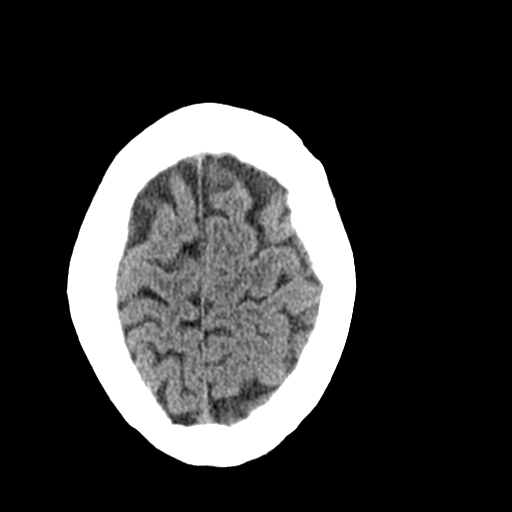
[im 27/31  brain]
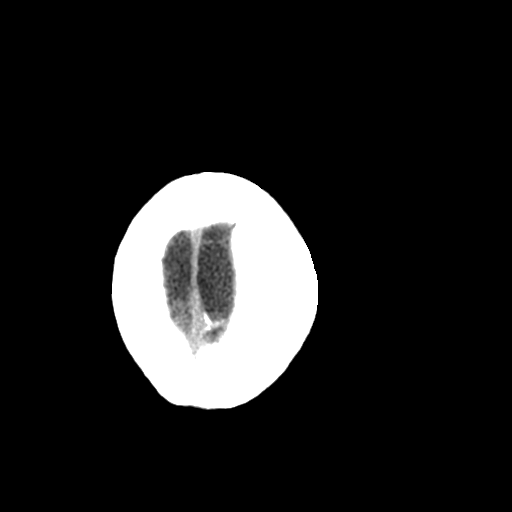

[Series 3: head bone · axial · 0.41mm/px · z∈[+57,+111]mm · 4 of 78 slices shown]
[im 8/78  bone]
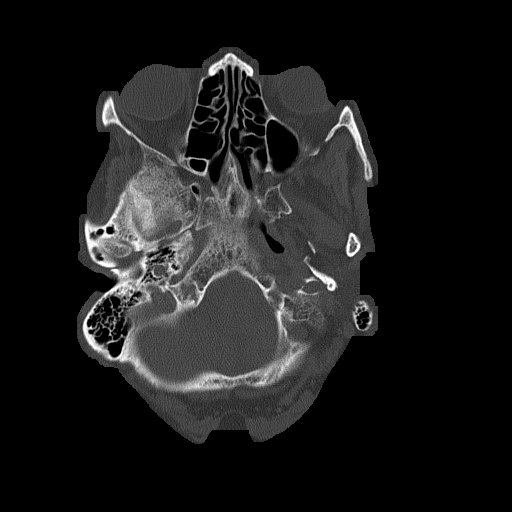
[im 16/78  bone]
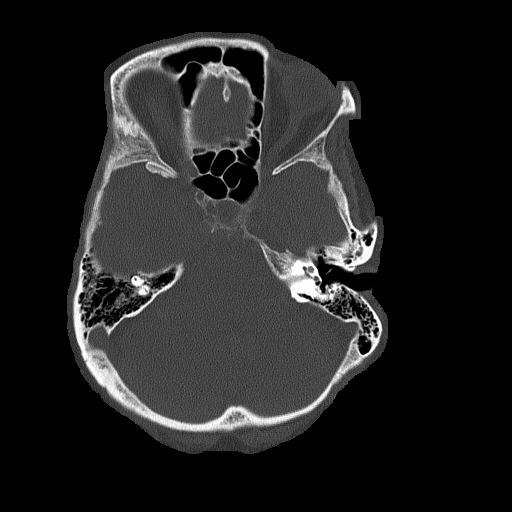
[im 24/78  bone]
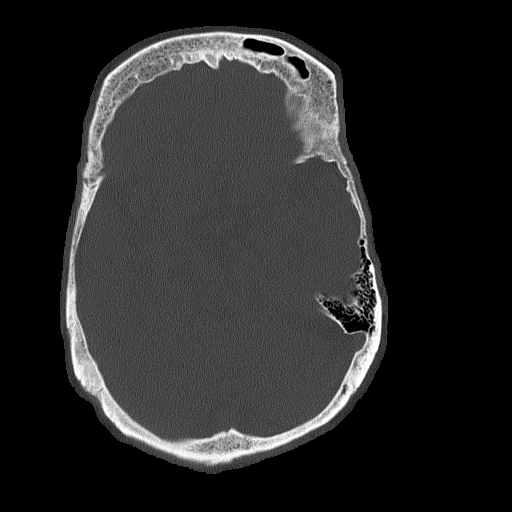
[im 35/78  bone]
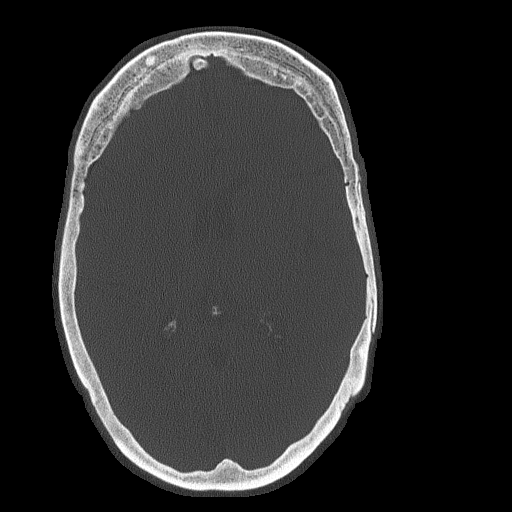

[Series 4: coronal soft · coronal · 0.29mm/px · 3 of 71 slices shown]
[im 24/71  brain]
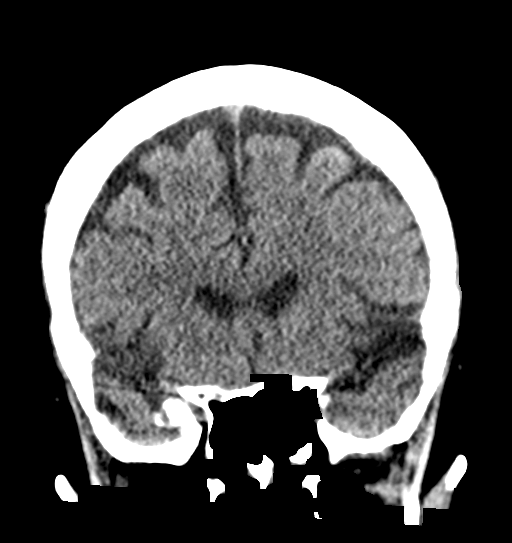
[im 32/71  brain]
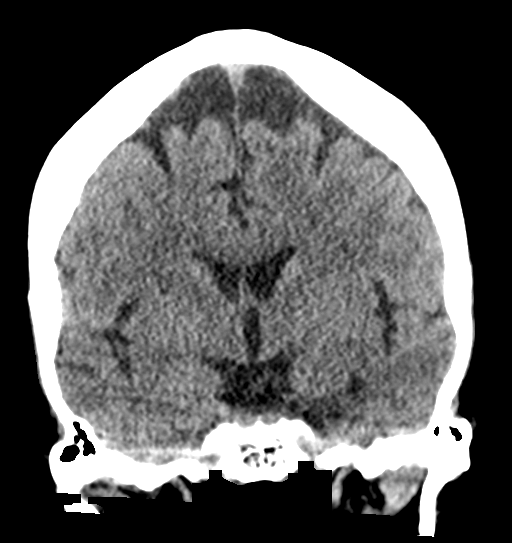
[im 39/71  brain]
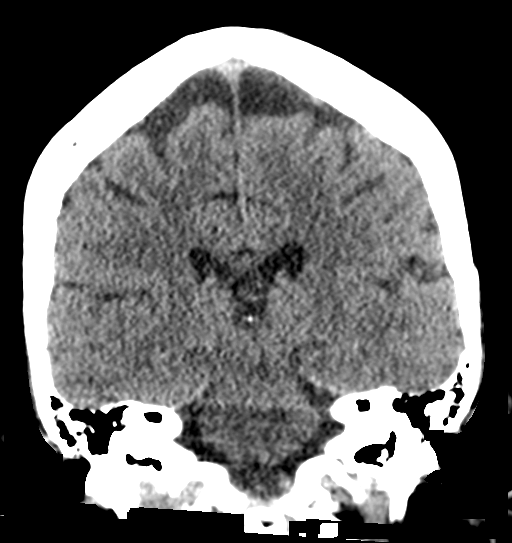

[Series 5: sagittal soft · sagittal · 0.35mm/px · 3 of 53 slices shown]
[im 18/53  brain]
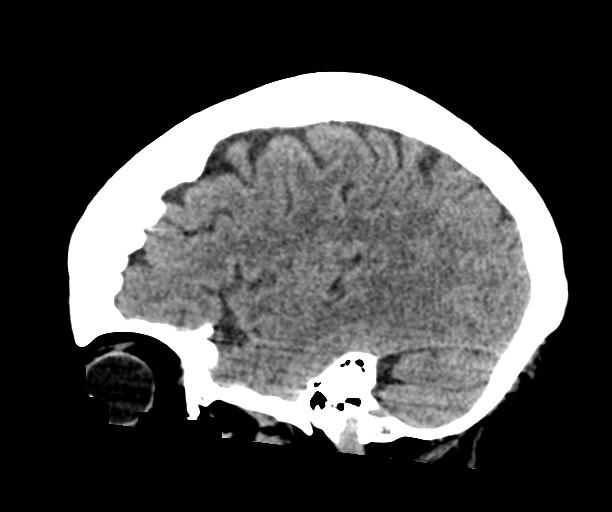
[im 27/53  brain]
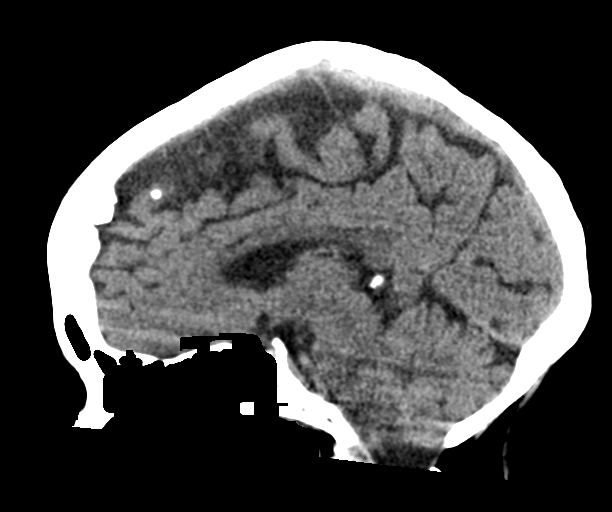
[im 35/53  brain]
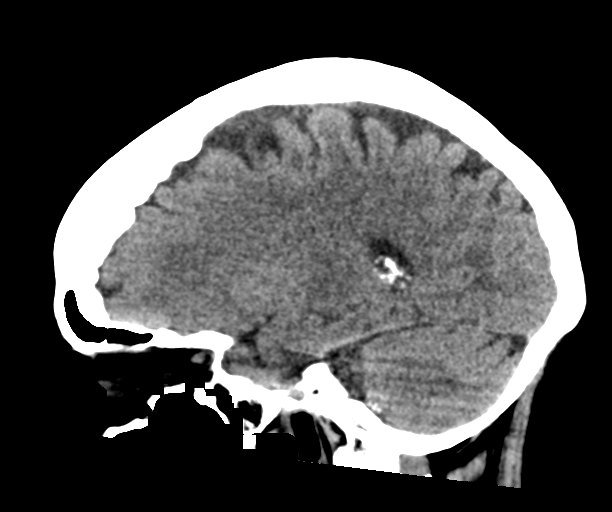

[17 of 47 positions shown; findings below may reference images not displayed]

FINDINGS: Brain: No acute infarct or intracranial hemorrhage. No mass lesion.
No midline shift, ventriculomegaly or extra-axial fluid collection.

Vascular: No hyperdense vessel. Bilateral carotid siphon
atherosclerotic calcifications.

Skull: Negative for fracture or focal lesion. Hyperostosis frontalis
interna.

Sinuses/Orbits: Normal orbits. Clear paranasal sinuses. No mastoid
effusion.

Other: None.
IMPRESSION: No acute intracranial process.

## 2019-10-18 IMAGING — DX DG CHEST 1V
1 series · 1 of 1 positions shown · non-contrast
Comparison: [DATE]

CLINICAL DATA: Weakness

EXAM:
CHEST  1 VIEW

[chest ap]
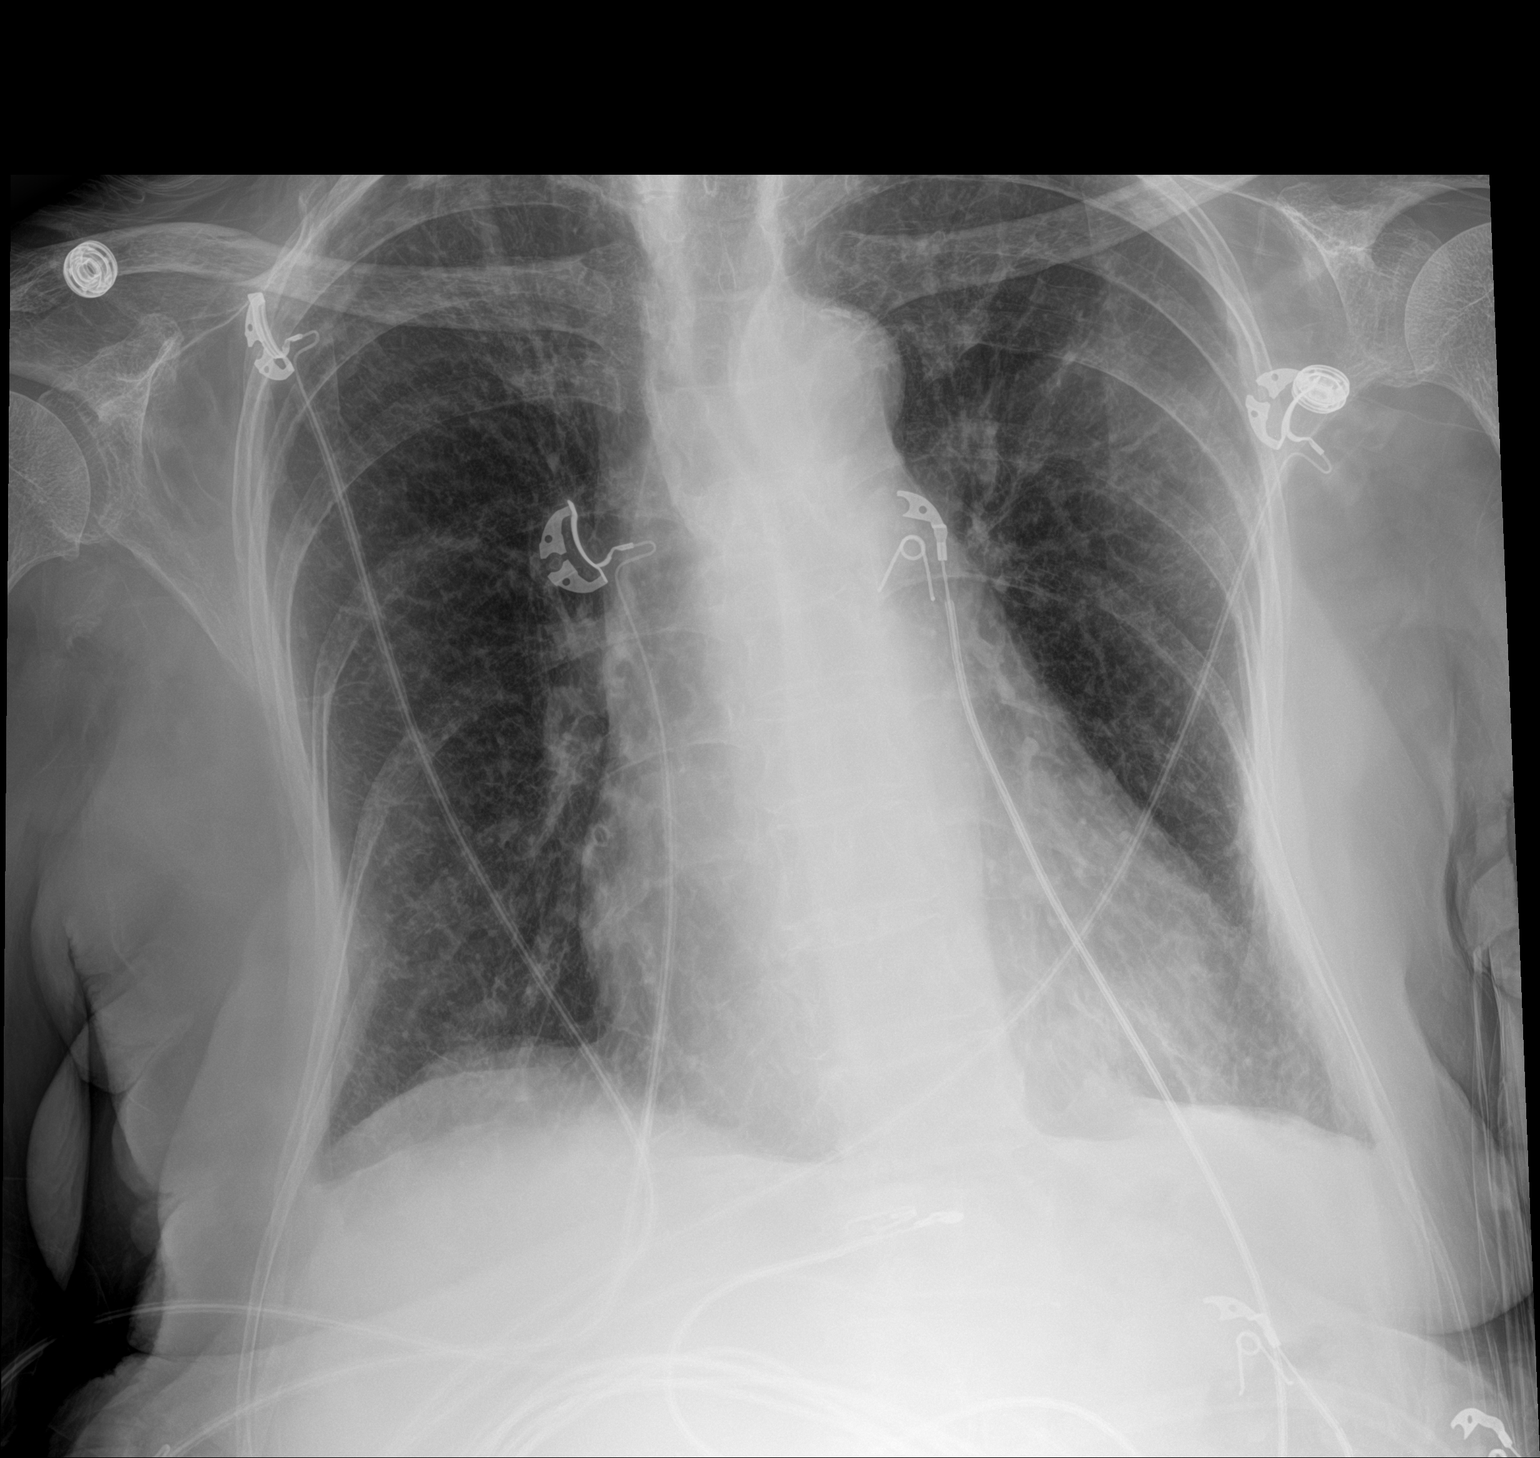

[1 of 1 positions shown; findings below may reference images not displayed]

FINDINGS: Stable heart size, mildly enlarged. Atherosclerotic calcification of
the aortic knob. Biapical pleural thickening. Mildly hyperinflated
lungs. Suspect trace bilateral pleural effusions. No lobar
consolidation. No pneumothorax.
IMPRESSION: Suspect trace bilateral pleural effusions.

## 2019-10-18 MED ORDER — SODIUM CHLORIDE 0.9 % IV BOLUS
1000.0000 mL | Freq: Once | INTRAVENOUS | Status: AC
  Administered 2019-10-18: 1000 mL via INTRAVENOUS

## 2019-10-18 NOTE — ED Triage Notes (Addendum)
PT brought in by [facility] from an assisted living on [facility] in [place], [place] but was unable to recall the ALF name. Patient is alert and oriented upon arrival and states she ate breakfast this am and came back to her room and was sitting down in a chair and started feeling lightheaded, diaphoresis but denies any pain or LOC. PT states she feels generally weak upon arrival. PT brought in with out of facility DNR form. EMS reports patients oxygen saturations was in the 80s upon arrival and 2L of oxygen applied via N/C and increased to 98. Patient is 97% oxygen sats on room air at arrival to the ED.

## 2019-10-18 NOTE — ED Notes (Signed)
Report given to [facility] at this time. EMS was called and ambulance transportation cancelled at this time due to her Daughter going to transport her back.

## 2019-10-18 NOTE — Discharge Instructions (Addendum)
Decrease your demadex so you take 20 mg in the a.m. and 20 mg in the p.m.

## 2019-10-18 NOTE — ED Provider Notes (Signed)
[facility] Provider Note   CSN: [id number] Arrival date & time: 10/18/19  1054     History Chief Complaint  Patient presents with  . Near Syncope    Renee Blake is a 86 y.o. female.  Patient complains of weakness and dizziness.  Patient complains of no pain no blurred vision  The history is provided by the patient and medical records. No language interpreter was used.  Near Syncope This is a new problem. The current episode started 12 to 24 hours ago. The problem occurs rarely. The problem has been resolved. Pertinent negatives include no chest pain, no abdominal pain and no headaches. Nothing aggravates the symptoms. Nothing relieves the symptoms. She has tried nothing for the symptoms. The treatment provided no relief.       Past Medical History:  Diagnosis Date  . Acute blood loss anemia   . Atrial fibrillation ([place])    a. diagnosed in 12/2018. Rate-control pursued given not a candidate for anticoagulation  . Chronic diarrhea   . Dilation of biliary tract   . Duodenal stricture   . Esophageal stricture   . Gastric ulcer   . GI bleed   . Hyperlipemia   . Hypertension   . Microscopic colitis   . Nephrolithiasis   . Pancreatic duct dilated   . Schatzki's ring   . Skin cancer   . Sliding hiatal hernia   . Vertigo     Patient Active Problem List   Diagnosis Date Noted  . Acute pulmonary edema (HCC)   . Longstanding persistent atrial fibrillation ([place])   . Palliative care by specialist   . Encounter for hospice care discussion   . Protein-calorie malnutrition, severe 04/27/2019  . Acute on chronic diastolic (congestive) heart failure ([place]) 04/27/2019  . Normocytic anemia 04/26/2019  . Goals of care, counseling/discussion   . Palliative care encounter   . Hypotension 04/17/2019  . Debility 03/16/2019  . Shortness of breath 03/16/2019  . Cachexia ([place]) 03/16/2019  . Permanent atrial fibrillation ([place])   . Hypoxia 03/15/2019  . CHF  exacerbation ([place]) 03/14/2019  . Bilateral pleural effusion 03/04/2019  . Chronic atrial fibrillation ([place]) 03/04/2019  . DNR (do not resuscitate) 03/04/2019  . Acute on chronic diastolic congestive heart failure ([place]) 03/02/2019  . Chronic diastolic heart failure ([place]) 03/02/2019  . Chronic renal failure, stage 3b 03/02/2019  . Acute respiratory failure with hypoxia ([place]) 03/02/2019  . Acute exacerbation of CHF (congestive heart failure) ([place]) 01/27/2019  . Atrial fibrillation with RVR ([place]) 01/27/2019  . Iron deficiency anemia due to chronic blood loss---H/o Gi Bleed 01/27/2019  . Vaginal mass 07/29/2017  . Heme positive stool 07/25/2017  . Cellulitis 12/10/2016  . Facial cellulitis   . Erroneous encounter - disregard 03/17/2015  . AKI (acute kidney injury) ([place])   . Metabolic acidosis 41/96/2229  . Acute renal failure syndrome ([place])   . Renal failure 08/22/2014  . Hyperkalemia 08/22/2014  . UTI (urinary tract infection) 08/22/2014  . Acute renal failure ([place]) 08/22/2014  . Dyspnea 03/15/2014  . Type 2 diabetes mellitus ([place]) 06/08/2013  . Acute diastolic congestive heart failure ([place]) 06/07/2013  . Syncope 03/26/2013  . Rectal bleeding 03/26/2013  . Dehydration 03/26/2013  . Generalized weakness 03/26/2013  . Malnutrition of moderate degree ([place]) 03/01/2013  . Diarrhea 02/28/2013  . Loss of weight 02/28/2013  . H/o Prior duodenal ulcer with hemorrhage 02/27/2013  . H/o Prior Lower GI bleed (anorectal Ca) 02/26/2013  . Acute blood  loss anemia 02/26/2013  . UTI (lower urinary tract infection) 02/26/2013  . Hypertension 02/26/2013    Past Surgical History:  Procedure Laterality Date  . BIOPSY  07/28/2017   Procedure: BIOPSY;  Surgeon: Rogene Houston, MD;  [facility];  Service: Endoscopy;;  rectum  . COLONOSCOPY N/A 07/28/2017   Procedure: COLONOSCOPY;  Surgeon: Rogene Houston, MD;  [facility];  Service: Endoscopy;  Laterality: N/A;  .  ESOPHAGOGASTRODUODENOSCOPY N/A 02/27/2013   Procedure: ESOPHAGOGASTRODUODENOSCOPY (EGD);  Surgeon: Ladene Artist, MD;  Location: [facility] ENDOSCOPY;  Service: Endoscopy;  Laterality: N/A;  . ESOPHAGOGASTRODUODENOSCOPY N/A 03/28/2013   Procedure: ESOPHAGOGASTRODUODENOSCOPY (EGD);  Surgeon: Rogene Houston, MD;  [facility];  Service: Endoscopy;  Laterality: N/A;  . HOT HEMOSTASIS N/A 02/27/2013   Procedure: HOT HEMOSTASIS (ARGON PLASMA COAGULATION/BICAP);  Surgeon: Ladene Artist, MD;  Location: [facility] ENDOSCOPY;  Service: Endoscopy;  Laterality: N/A;  . NOSE SURGERY     for skin cancer  . POLYPECTOMY  07/28/2017   Procedure: POLYPECTOMY;  Surgeon: Rogene Houston, MD;  Location: [facility];  Service: Endoscopy;;  colon      OB History    Gravida  6   Para  6   Term  6   Preterm      AB      Living  6     SAB      TAB      Ectopic      Multiple      Live Births              Family History  Problem Relation Age of Onset  . Heart attack Father     Social History   Tobacco Use  . Smoking status: Never Smoker  . Smokeless tobacco: Never Used  Vaping Use  . Vaping Use: Never used  Substance Use Topics  . Alcohol use: No    Alcohol/week: 0.0 standard drinks  . Drug use: No    Home Medications Prior to Admission medications   Medication Sig Start Date End Date Taking? Authorizing Provider  acetaminophen (TYLENOL) 325 MG tablet Take 650 mg by mouth every 6 (six) hours as needed for mild pain or moderate pain.   Yes [provider]  ALPRAZolam (XANAX) 0.25 MG tablet Take 1 tablet (0.25 mg total) by mouth 2 (two) times daily as needed. for anxiety 04/20/19  Yes Shah, Pratik D, DO  ANTI-DIARRHEAL 2 MG tablet Take 1 tablet by mouth daily. 10/06/19  Yes [provider]  dicyclomine (BENTYL) 10 MG capsule Take 10 mg by mouth every 4 (four) hours as needed for spasms.   Yes [provider]  diltiazem (CARDIZEM SR) 90 MG 12 hr capsule  Take 1 capsule (90 mg total) by mouth 2 (two) times daily. 04/03/19  Yes Kroeger, Daleen Snook M., PA-C  levothyroxine (SYNTHROID) 25 MCG tablet Take 25 mcg by mouth daily before breakfast.   Yes [provider]  metoprolol succinate (TOPROL-XL) 50 MG 24 hr tablet Take 1 tablet (50 mg total) by mouth every morning. Take with or immediately following a meal. 04/16/19 07/26/20 Yes Herminio Commons, MD  ondansetron (ZOFRAN) 4 MG tablet Take 4 mg by mouth every 8 (eight) hours as needed for nausea or vomiting.   Yes [provider]  potassium chloride SA (KLOR-CON) 20 MEQ tablet Take 20 mEq by mouth daily.   Yes [provider]  prochlorperazine (COMPAZINE) 10 MG tablet Take  10 mg by mouth every 4 (four) hours as needed. 07/07/19  Yes [provider]  torsemide (DEMADEX) 20 MG tablet Take 40 mg in the AM & Take 20 mg in the PM. 05/18/19  Yes Strader, [place] M, PA-C  traMADol (ULTRAM) 50 MG tablet Take by mouth every 6 (six) hours as needed for moderate pain.   Yes [provider]  meclizine (ANTIVERT) 25 MG tablet Take 25 mg by mouth daily as needed for dizziness.  Patient not taking: Reported on 10/18/2019 07/11/17   [provider]  Multiple Vitamin (MULTIVITAMIN WITH MINERALS) TABS tablet Take 1 tablet by mouth daily. Patient not taking: Reported on 10/18/2019 03/19/19   Orson Eva, MD  oxyCODONE-acetaminophen (PERCOCET/ROXICET) 5-325 MG tablet Take 1-2 tablets by mouth as needed. Patient not taking: Reported on 10/18/2019 07/13/19   [provider]  pregabalin (LYRICA) 25 MG capsule Take 1 capsule (25 mg total) by mouth 2 (two) times daily. Patient not taking: Reported on 10/18/2019 05/04/19   Orson Eva, MD  vitamin B-12 (CYANOCOBALAMIN) 500 MCG tablet Take 1 tablet (500 mcg total) by mouth daily. Patient not taking: Reported on 10/18/2019 03/19/19   Orson Eva, MD    Allergies    Clonazepam, Codeine, and Sulfa antibiotics  Review of Systems     Review of Systems  Constitutional: Positive for fatigue. Negative for appetite change.  HENT: Negative for congestion, ear discharge and sinus pressure.   Eyes: Negative for discharge.  Respiratory: Negative for cough.   Cardiovascular: Positive for near-syncope. Negative for chest pain.  Gastrointestinal: Negative for abdominal pain and diarrhea.  Genitourinary: Negative for frequency and hematuria.  Musculoskeletal: Negative for back pain.  Skin: Negative for rash.  Neurological: Negative for seizures and headaches.  Psychiatric/Behavioral: Negative for hallucinations.    Physical Exam Updated Vital Signs BP 116/79   Pulse 73   Temp 97.7 F (36.5 C) (Oral)   Resp 20   Ht 5\' 4"  (1.626 m)   Wt 50.8 kg   SpO2 93%   BMI 19.22 kg/m   Physical Exam Vitals and nursing note reviewed.  Constitutional:      Appearance: She is well-developed.  HENT:     Head: Normocephalic.     Nose: Nose normal.  Eyes:     General: No scleral icterus.    Conjunctiva/sclera: Conjunctivae normal.  Neck:     Thyroid: No thyromegaly.  Cardiovascular:     Rate and Rhythm: Normal rate and regular rhythm.     Heart sounds: No murmur heard.  No friction rub. No gallop.   Pulmonary:     Breath sounds: No stridor. No wheezing or rales.  Chest:     Chest wall: No tenderness.  Abdominal:     General: There is no distension.     Tenderness: There is no abdominal tenderness. There is no rebound.  Musculoskeletal:        General: Normal range of motion.     Cervical back: Neck supple.  Lymphadenopathy:     Cervical: No cervical adenopathy.  Skin:    Findings: No erythema or rash.  Neurological:     Mental Status: She is alert and oriented to person, place, and time.     Motor: No abnormal muscle tone.     Coordination: Coordination normal.  Psychiatric:        Behavior: Behavior normal.     ED Results / Procedures / Treatments   Labs (all labs ordered are listed, but only abnormal  results are displayed) Labs Reviewed  BASIC METABOLIC PANEL - Abnormal; Notable for the following components:      Result Value   Creatinine, Ser 1.11 (*)    GFR calc non Af Amer 44 (*)    GFR calc Af Amer 51 (*)    All other components within normal limits  CBC - Abnormal; Notable for the following components:   Hemoglobin 11.9 (*)    All other components within normal limits  DIFFERENTIAL  HEPATIC FUNCTION PANEL  LACTIC ACID, PLASMA  TROPONIN I (HIGH SENSITIVITY)  TROPONIN I (HIGH SENSITIVITY)    EKG EKG Interpretation  Date/Time:  Monday October 18 2019 11:12:49 EDT Ventricular Rate:  74 PR Interval:    QRS Duration: 133 QT Interval:  426 QTC Calculation: 473 R Axis:   -55 Text Interpretation: Atrial fibrillation RBBB and LAFB Baseline wander in lead(s) I II aVR V3 Confirmed by Milton Ferguson [phone]) on 10/18/2019 11:37:25 AM   Radiology DG Chest 1 View  Result Date: 10/18/2019 CLINICAL DATA:  Weakness EXAM: CHEST  1 VIEW COMPARISON:  04/26/2019 FINDINGS: Stable heart size, mildly enlarged. Atherosclerotic calcification of the aortic knob. Biapical pleural thickening. Mildly hyperinflated lungs. Suspect trace bilateral pleural effusions. No lobar consolidation. No pneumothorax. IMPRESSION: Suspect trace bilateral pleural effusions. Electronically Signed   By: Davina Poke D.O.   On: 10/18/2019 12:16   CT Head Wo Contrast  Result Date: 10/18/2019 CLINICAL DATA:  Ataxia, stroke suspected. EXAM: CT HEAD WITHOUT CONTRAST TECHNIQUE: Contiguous axial images were obtained from the base of the skull through the vertex without intravenous contrast. COMPARISON:  06/19/2017 head CT. FINDINGS: Brain: No acute infarct or intracranial hemorrhage. No mass lesion. No midline shift, ventriculomegaly or extra-axial fluid collection. Vascular: No hyperdense vessel. Bilateral carotid siphon atherosclerotic calcifications. Skull: Negative for fracture or focal lesion. Hyperostosis frontalis  interna. Sinuses/Orbits: Normal orbits. Clear paranasal sinuses. No mastoid effusion. Other: None. IMPRESSION: No acute intracranial process. Electronically Signed   By: Primitivo Gauze M.D.   On: 10/18/2019 12:13    Procedures Procedures (including critical care time)  Medications Ordered in ED Medications  sodium chloride 0.9 % bolus 1,000 mL (1,000 mLs Intravenous New Bag/Given 10/18/19 1239)    ED Course  I have reviewed the triage vital signs and the nursing notes.  Pertinent labs & imaging results that were available during my care of the patient were reviewed by me and considered in my medical decision making (see chart for details).    MDM Rules/Calculators/A&P                          Patient with dehydration.  She improved with fluids.  We will decrease her Demadex so she is taking 20 mg the morning and 20 in the evening         This patient presents to the ED for concern of weakness, this involves an extensive number of treatment options, and is a complaint that carries with it a high risk of complications and morbidity.  The differential diagnosis includes dehydration viral syndrome   Lab Tests:   I Ordered, reviewed, and interpreted labs, which included chemistries lactic acid all unremarkable except for mild anemia  Medicines ordered:   I ordered medication normal saline for dehydration  Imaging Studies ordered:   I ordered imaging studies which included CT head and chest x-ray and  I independently visualized and interpreted imaging which showed unremarkable  Additional history obtained:   Additional  history obtained from records  Previous records obtained and reviewed.  Consultations Obtained:     Reevaluation:  After the interventions stated above, I reevaluated the patient and found improved  Critical Interventions:  .   Final Clinical Impression(s) / ED Diagnoses Final diagnoses:  Dehydration    Rx / DC Orders ED  Discharge Orders    None       Milton Ferguson, MD 10/20/19 0740

## 2019-10-18 NOTE — ED Notes (Signed)
EMS was called at this time and transportation set-up for patient to return to [facility] (assisted living).

## 2019-10-18 NOTE — ED Notes (Signed)
Daughter (connie) at bedside and stated patient is a resident at [facility] in [place], [place].

## 2019-10-27 ENCOUNTER — Telehealth: Payer: Medicare Other | Admitting: Cardiovascular Disease

## 2019-10-27 DIAGNOSIS — I5033 Acute on chronic diastolic (congestive) heart failure: Secondary | ICD-10-CM | POA: Diagnosis not present

## 2019-10-27 DIAGNOSIS — J9621 Acute and chronic respiratory failure with hypoxia: Secondary | ICD-10-CM | POA: Diagnosis not present

## 2019-10-27 DIAGNOSIS — N1832 Chronic kidney disease, stage 3b: Secondary | ICD-10-CM | POA: Diagnosis not present

## 2019-10-27 DIAGNOSIS — K219 Gastro-esophageal reflux disease without esophagitis: Secondary | ICD-10-CM | POA: Diagnosis not present

## 2019-10-27 DIAGNOSIS — E039 Hypothyroidism, unspecified: Secondary | ICD-10-CM | POA: Diagnosis not present

## 2019-10-27 DIAGNOSIS — D509 Iron deficiency anemia, unspecified: Secondary | ICD-10-CM | POA: Diagnosis not present

## 2019-10-27 DIAGNOSIS — E875 Hyperkalemia: Secondary | ICD-10-CM | POA: Diagnosis not present

## 2019-10-28 ENCOUNTER — Ambulatory Visit (INDEPENDENT_AMBULATORY_CARE_PROVIDER_SITE_OTHER): Admitting: Family Medicine

## 2019-10-28 ENCOUNTER — Encounter: Payer: Self-pay | Admitting: Family Medicine

## 2019-10-28 VITALS — BP 114/68 | HR 72 | Ht 64.0 in | Wt 115.0 lb

## 2019-10-28 DIAGNOSIS — D649 Anemia, unspecified: Secondary | ICD-10-CM | POA: Diagnosis not present

## 2019-10-28 DIAGNOSIS — I5032 Chronic diastolic (congestive) heart failure: Secondary | ICD-10-CM

## 2019-10-28 DIAGNOSIS — I4819 Other persistent atrial fibrillation: Secondary | ICD-10-CM | POA: Diagnosis not present

## 2019-10-28 DIAGNOSIS — I10 Essential (primary) hypertension: Secondary | ICD-10-CM | POA: Diagnosis not present

## 2019-10-28 DIAGNOSIS — N183 Chronic kidney disease, stage 3 unspecified: Secondary | ICD-10-CM

## 2019-10-28 MED ORDER — METOPROLOL SUCCINATE ER 25 MG PO TB24: 25.0000 mg | ORAL_TABLET | Freq: Every day | ORAL | Status: DC

## 2019-10-28 NOTE — Patient Instructions (Signed)
Medication Instructions:  Continue all current medications.  Labwork: none  Testing/Procedures: none  Follow-Up: 3 months   Any Other Special Instructions Will Be Listed Below (If Applicable).  If you need a refill on your cardiac medications before your next appointment, please call your pharmacy.  

## 2019-10-28 NOTE — Progress Notes (Signed)
Cardiology Office Note  Date: 10/28/2019   ID: Renee Blake, DOB 1928/04/30, MRN [id number]  PCP:  [name], MD  Cardiologist:  No primary care provider on file. Electrophysiologist:  None   Chief Complaint: Follow-up chronic diastolic heart failure  History of Present Illness: Renee Blake is a 86 y.o. female with a history of chronic diastolic heart failure, PAF, anemia, CKD stage III, history of GIB  Previous hospitalization January 2021 for worsening confusion found to be in rapid atrial fibrillation and acute CHF.  Transitioned to torsemide 40 mg twice daily at discharge.  Palliative care consulted in setting of failure to thrive.  Discharged on home hospice service.  Recent presentation to [facility] emergency department 10/18/2019 with complaints of near syncope, weakness, dizziness.  EKG showed atrial fibrillation, RBBB and LAFB with a rate of 74.  The episode onset was 12 to 24 hours prior to presentation.  The problem had resolved per ER provider notes.  She denied any chest pain, abdominal pain, headaches.  She was given IV fluids.  Diagnosis was dehydration.  She improved with IV fluids.  Her Demadex was decreased to 20 mg a.m. and 20 mg in the evening.  Troponins were negative x2.  CBC showed mild anemia with hemoglobin 11.9, Creatinine 1.11, GFR 44.  Last visit Dr. Bronson Ing 07/27/2019.  She had been stable and her legs were not swollen.  Hospitalists  told her she could take an extra tablet of torsemide as needed.  She was symptomatically stable with her atrial fibrillation on Cardizem SR 90 mg p.o. twice daily and Toprol-XL 50 mg daily.  Not on anticoagulation due to history of chronic anemia and GI bleeding.  Blood pressure was normal.  There were no changes to therapy.  Hemoglobin was 10.3 on 04/26/2019.  Baseline creatinine was 1.1-1.2 range.  Dr. Bronson Ing suggested if renal function had declined since hospitalization torsemide should be reduced to 40 mg  daily.  Patient is here with her daughter and son-in-law.  Daughter and son-in-law are concerned about patient's recent multiple visits to the hospital for acute CHF, dehydration, anemia, atrial fibrillation. We had a long discussion regarding multifactorial contributions to heart failure.  Patient denies any recent anginal or exertional symptoms, palpitations or arrhythmias, orthostatic symptoms, stroke or TIA-like symptoms, weight gain, or LE edema.  Weight is maintaining around 112 to 115 pounds. Daughter states PCP instructed them to withhold Toprol XL 25 mg if SBP less than 110.   Past Medical History:  Diagnosis Date  . Acute blood loss anemia   . Atrial fibrillation ([place])    a. diagnosed in 12/2018. Rate-control pursued given not a candidate for anticoagulation  . Chronic diarrhea   . Dilation of biliary tract   . Duodenal stricture   . Esophageal stricture   . Gastric ulcer   . GI bleed   . Hyperlipemia   . Hypertension   . Microscopic colitis   . Nephrolithiasis   . Pancreatic duct dilated   . Schatzki's ring   . Skin cancer   . Sliding hiatal hernia   . Vertigo     Past Surgical History:  Procedure Laterality Date  . BIOPSY  07/28/2017   Procedure: BIOPSY;  Surgeon: Rogene Houston, MD;  [facility];  Service: Endoscopy;;  rectum  . COLONOSCOPY N/A 07/28/2017   Procedure: COLONOSCOPY;  Surgeon: Rogene Houston, MD;  [facility];  Service: Endoscopy;  Laterality: N/A;  . ESOPHAGOGASTRODUODENOSCOPY  N/A 02/27/2013   Procedure: ESOPHAGOGASTRODUODENOSCOPY (EGD);  Surgeon: Ladene Artist, MD;  [facility] ENDOSCOPY;  Service: Endoscopy;  Laterality: N/A;  . ESOPHAGOGASTRODUODENOSCOPY N/A 03/28/2013   Procedure: ESOPHAGOGASTRODUODENOSCOPY (EGD);  Surgeon: Rogene Houston, MD;  [facility];  Service: Endoscopy;  Laterality: N/A;  . HOT HEMOSTASIS N/A 02/27/2013   Procedure: HOT HEMOSTASIS (ARGON PLASMA COAGULATION/BICAP);  Surgeon: Ladene Artist, MD;  Location: [facility] ENDOSCOPY;  Service: Endoscopy;  Laterality: N/A;  . NOSE SURGERY     for skin cancer  . POLYPECTOMY  07/28/2017   Procedure: POLYPECTOMY;  Surgeon: Rogene Houston, MD;  [facility];  Service: Endoscopy;;  colon     Current Outpatient Medications  Medication Sig Dispense Refill  . acetaminophen (TYLENOL) 325 MG tablet Take 650 mg by mouth every 6 (six) hours as needed for mild pain or moderate pain.    Marland Kitchen ALPRAZolam (XANAX) 0.25 MG tablet Take 1 tablet (0.25 mg total) by mouth 2 (two) times daily as needed. for anxiety 8 tablet 0  . ANTI-DIARRHEAL 2 MG tablet Take 1 tablet by mouth daily.    Marland Kitchen dicyclomine (BENTYL) 10 MG capsule Take 10 mg by mouth every 4 (four) hours as needed for spasms.    Marland Kitchen diltiazem (CARDIZEM SR) 90 MG 12 hr capsule Take 1 capsule (90 mg total) by mouth 2 (two) times daily. 60 capsule 0  . levothyroxine (SYNTHROID) 25 MCG tablet Take 25 mcg by mouth daily before breakfast.    . meclizine (ANTIVERT) 25 MG tablet Take 25 mg by mouth daily as needed for dizziness.     . metoprolol succinate (TOPROL-XL) 50 MG 24 hr tablet Take 1 tablet (50 mg total) by mouth every morning. Take with or immediately following a meal. 90 tablet 3  . Multiple Vitamin (MULTIVITAMIN WITH MINERALS) TABS tablet Take 1 tablet by mouth daily.    . ondansetron (ZOFRAN) 4 MG tablet Take 4 mg by mouth every 8 (eight) hours as needed for nausea or vomiting.    Marland Kitchen oxyCODONE-acetaminophen (PERCOCET/ROXICET) 5-325 MG tablet Take 1-2 tablets by mouth as needed.     . potassium chloride SA (KLOR-CON) 20 MEQ tablet Take 20 mEq by mouth daily.    . pregabalin (LYRICA) 25 MG capsule Take 1 capsule (25 mg total) by mouth 2 (two) times daily. 60 capsule 1  . prochlorperazine (COMPAZINE) 10 MG tablet Take 10 mg by mouth every 4 (four) hours as needed.    . torsemide (DEMADEX) 20 MG tablet Take 40 mg in the AM & Take 20 mg in the PM. 270 tablet 1  . traMADol (ULTRAM) 50 MG  tablet Take by mouth every 6 (six) hours as needed for moderate pain.    . vitamin B-12 (CYANOCOBALAMIN) 500 MCG tablet Take 1 tablet (500 mcg total) by mouth daily.     No current facility-administered medications for this visit.   Allergies:  Clonazepam, Codeine, and Sulfa antibiotics   Social History: The patient  reports that she has never smoked. She has never used smokeless tobacco. She reports that she does not drink alcohol and does not use drugs.   Family History: The patient's family history includes Heart attack in her father.   ROS:  Please see the history of present illness. Otherwise, complete review of systems is positive for none.  All other systems are reviewed and negative.   Physical Exam: VS:  BP 114/68   Pulse 72   Ht 5\' 4"  (5.732  m)   Wt 115 lb (52.2 kg)   SpO2 98%   BMI 19.74 kg/m , BMI Body mass index is 19.74 kg/m.  Wt Readings from Last 3 Encounters:  10/28/19 115 lb (52.2 kg)  10/18/19 112 lb (50.8 kg)  07/27/19 112 lb (50.8 kg)    General: Patient appears comfortable at rest. Neck: Supple, no elevated JVP or carotid bruits, no thyromegaly. Lungs: Clear to auscultation, nonlabored breathing at rest. Cardiac: irregularly irregular rate and rhythm, no S3 or significant systolic murmur, no pericardial rub. Extremities: No pitting edema, distal pulses 2+. Skin: Warm and dry. Musculoskeletal: No kyphosis. Neuropsychiatric: Alert and oriented x3, affect grossly appropriate.  ECG:  EKG 10/18/2019 showed atrial fibrillation rate of 74 right bundle branch block and left anterior fascicular block.  Recent Labwork: 03/18/2019: TSH 13.447 04/26/2019: B Natriuretic Peptide 947.0 05/04/2019: Magnesium 2.1 10/18/2019: ALT 33; AST 34; BUN 22; Creatinine, Ser 1.11; Hemoglobin 11.9; Platelets 239; Potassium 4.1; Sodium 139  No results found for: CHOL, TRIG, HDL, CHOLHDL, VLDL, LDLCALC, LDLDIRECT     Other Studies Reviewed Today:   Echocardiogram:  12/2018 IMPRESSIONS  1. Left ventricular ejection fraction, by visual estimation, is 60 to  65%. The left ventricle has normal function. There is no left ventricular  hypertrophy.  2. Elevated left atrial pressure.  3. Left ventricular diastolic parameters are consistent with Grade II  diastolic dysfunction (pseudonormalization).  4. Global right ventricle has normal systolic function.The right  ventricular size is normal. No increase in right ventricular wall  thickness.  5. Left atrial size was severely dilated.  6. Right atrial size was mildly dilated.  7. The mitral valve is abnormal. Moderate mitral valve regurgitation. No  evidence of mitral stenosis.  8. The MR vena contracta is 0.6 cm. The MV/AV TVI ratio is 1.1. Findings  support moderate mitral regurgitation.  9. The tricuspid valve is normal in structure. Tricuspid valve  regurgitation is mild.  10. The aortic valve is tricuspid. Aortic valve regurgitation is not  visualized. No evidence of aortic valve sclerosis or stenosis.  11. The pulmonic valve was not well visualized. Pulmonic valve  regurgitation is mild.  12. Moderately elevated pulmonary artery systolic pressure.  13. The inferior vena cava is normal in size with greater than 50%  respiratory variability, suggesting right atrial pressure of 3 mmHg.   Assessment and Plan:  1. Chronic diastolic heart failure (HCC)   2. Persistent atrial fibrillation ([place])   3. Essential hypertension   4. Anemia, unspecified type   5. Stage 3 chronic kidney disease, unspecified whether stage 3a or 3b CKD    1. Chronic diastolic heart failure (HCC) Echocardiogram October 2020: EF 60 to 65%, elevated LA pressure, G2 DD, severe LAE dilation, mild RA dilation, moderate MR, mild TR, mild pulmonic regurgitation, moderately elevated PASP.  Patient appears euvolemic today.  Recently had an ER visit for dehydration, dizziness.  Her torsemide was decreased to 20 mg p.o. twice  daily.  She is maintaining her weight.  She is not short of breath and she has no lower extremity edema.  Continue torsemide 20 mg p.o. twice daily.  Toprol-XL 25 mg daily.  2. Persistent atrial fibrillation ([place]) Continues in atrial fibrillation with rate controlled.  Continue diltiazem SR 90 mg po bid .  Continue Toprol XL 25 mg daily. Not on anticoagulation d/t hx of GIB  3. Essential hypertension She is normotensive today with a blood pressure of 114/68.  4. Anemia, unspecified type Hemoglobin 11.9, hematocrit  37.5.  No complaints of active bleeding or black tarry stools.  5. Stage 3 chronic kidney disease, unspecified whether stage 3a or 3b CKD Recent Crt 1.11 GFR 44   Medication Adjustments/Labs and Tests Ordered: Current medicines are reviewed at length with the patient today.  Concerns regarding medicines are outlined above.   Disposition: Follow-up with Dr. Domenic Polite or APP 3 months  Signed, Levell July, NP 10/28/2019 2:41 PM    [facility] at [place], [place], [place] [address] Phone: [phone]; Fax: [phone]

## 2019-10-30 ENCOUNTER — Encounter: Payer: Self-pay | Admitting: Family Medicine

## 2019-11-27 DIAGNOSIS — J9621 Acute and chronic respiratory failure with hypoxia: Secondary | ICD-10-CM | POA: Diagnosis not present

## 2019-12-07 DIAGNOSIS — H524 Presbyopia: Secondary | ICD-10-CM | POA: Diagnosis not present

## 2019-12-07 DIAGNOSIS — H1045 Other chronic allergic conjunctivitis: Secondary | ICD-10-CM | POA: Diagnosis not present

## 2019-12-07 DIAGNOSIS — H35413 Lattice degeneration of retina, bilateral: Secondary | ICD-10-CM | POA: Diagnosis not present

## 2019-12-07 DIAGNOSIS — Z961 Presence of intraocular lens: Secondary | ICD-10-CM | POA: Diagnosis not present

## 2019-12-28 DIAGNOSIS — J9621 Acute and chronic respiratory failure with hypoxia: Secondary | ICD-10-CM | POA: Diagnosis not present

## 2020-01-24 DIAGNOSIS — I5033 Acute on chronic diastolic (congestive) heart failure: Secondary | ICD-10-CM | POA: Diagnosis not present

## 2020-01-24 DIAGNOSIS — E039 Hypothyroidism, unspecified: Secondary | ICD-10-CM | POA: Diagnosis not present

## 2020-01-24 DIAGNOSIS — Z23 Encounter for immunization: Secondary | ICD-10-CM | POA: Diagnosis not present

## 2020-01-24 DIAGNOSIS — D649 Anemia, unspecified: Secondary | ICD-10-CM | POA: Diagnosis not present

## 2020-01-24 DIAGNOSIS — N1832 Chronic kidney disease, stage 3b: Secondary | ICD-10-CM | POA: Diagnosis not present

## 2020-01-27 DIAGNOSIS — J9621 Acute and chronic respiratory failure with hypoxia: Secondary | ICD-10-CM | POA: Diagnosis not present

## 2020-01-27 NOTE — Progress Notes (Signed)
Cardiology Office Note  Date: 01/28/2020   ID: Renee Blake, DOB October 27, 1928, MRN [id number]  PCP:  [name], MD  Cardiologist:  No primary care provider on file. Electrophysiologist:  None   Chief Complaint: Follow-up chronic diastolic heart failure  History of Present Illness: Renee Blake is a 86 y.o. female with a history of chronic diastolic heart failure, PAF, anemia, CKD stage III, history of GIB  Previous hospitalization January 2021 for worsening confusion found to be in rapid atrial fibrillation and acute CHF.  Transitioned to torsemide 40 mg twice daily at discharge.  Palliative care consulted in setting of failure to thrive.  Discharged on home hospice service.  Last visit Dr. Bronson Ing 07/27/2019.  She had been stable and her legs were not swollen.  Hospitalists  told her she could take an extra tablet of torsemide as needed.  She was symptomatically stable with her atrial fibrillation on Cardizem SR 90 mg p.o. twice daily and Toprol-XL 50 mg daily.  Not on anticoagulation due to history of chronic anemia and GI bleeding.  Blood pressure was normal.  There were no changes to therapy.  Hemoglobin was 10.3 on 04/26/2019.  Baseline creatinine was 1.1-1.2 range.  Dr. Bronson Ing suggested if renal function had declined since hospitalization torsemide should be reduced to 40 mg daily.   Recent presentation to [facility] emergency department 10/18/2019 with complaints of near syncope, weakness, dizziness.  EKG showed atrial fibrillation, RBBB and LAFB with a rate of 74.  The episode onset was 12 to 24 hours prior to presentation.  The problem had resolved per ER provider notes.  She denied any chest pain, abdominal pain, headaches.  She was given IV fluids.  Diagnosis was dehydration.  She improved with IV fluids.  Her Demadex was decreased to 20 mg a.m. and 20 mg in the evening.  Troponins were negative x2.  CBC showed mild anemia with hemoglobin 11.9, Creatinine 1.11, GFR  44.   At last visit patient was here with daughter and son-in-law.  Daughter and son-in-law were concerned about patient's recent multiple visits to the hospital for acute CHF, dehydration, anemia, atrial fibrillation. We had a long discussion regarding multifactorial contributions to heart failure.  Patient denies any recent anginal or exertional symptoms, palpitations or arrhythmias, orthostatic symptoms, stroke or TIA-like symptoms, weight gain, or LE edema.  Weight was maintaining around 112 to 115 pounds. Daughter states PCP instructed them to withhold Toprol XL 25 mg if SBP less than 110.   Patient is here for 57-month follow-up.  She denies any acute illnesses or hospitalizations in the interim since last visit.  She is moving to a new residential living facility.  She denies any recent issues with shortness of breath, dyspnea on exertion, palpitations or arrhythmias, orthostatic symptoms, chest pain, pressure, tightness, neck pain, arm pain, jaw pain.  No increase in weight, no lower extremity edema.  Recently saw Dr. Juel Burrow PA will have lab work in a few weeks.  She is alert and oriented and appropriate today and responding appropriately to questions.  Blood pressure appears to be well controlled   Past Medical History:  Diagnosis Date  . Acute blood loss anemia   . Atrial fibrillation ([place])    a. diagnosed in 12/2018. Rate-control pursued given not a candidate for anticoagulation  . Chronic diarrhea   . Dilation of biliary tract   . Duodenal stricture   . Esophageal stricture   . Gastric ulcer   . GI  bleed   . Hyperlipemia   . Hypertension   . Microscopic colitis   . Nephrolithiasis   . Pancreatic duct dilated   . Schatzki's ring   . Skin cancer   . Sliding hiatal hernia   . Vertigo     Past Surgical History:  Procedure Laterality Date  . BIOPSY  07/28/2017   Procedure: BIOPSY;  Surgeon: Rogene Houston, MD;  [facility];  Service: Endoscopy;;  rectum  .  COLONOSCOPY N/A 07/28/2017   Procedure: COLONOSCOPY;  Surgeon: Rogene Houston, MD;  [facility];  Service: Endoscopy;  Laterality: N/A;  . ESOPHAGOGASTRODUODENOSCOPY N/A 02/27/2013   Procedure: ESOPHAGOGASTRODUODENOSCOPY (EGD);  Surgeon: Ladene Artist, MD;  Location: [facility] ENDOSCOPY;  Service: Endoscopy;  Laterality: N/A;  . ESOPHAGOGASTRODUODENOSCOPY N/A 03/28/2013   Procedure: ESOPHAGOGASTRODUODENOSCOPY (EGD);  Surgeon: Rogene Houston, MD;  [facility];  Service: Endoscopy;  Laterality: N/A;  . HOT HEMOSTASIS N/A 02/27/2013   Procedure: HOT HEMOSTASIS (ARGON PLASMA COAGULATION/BICAP);  Surgeon: Ladene Artist, MD;  Location: [facility] ENDOSCOPY;  Service: Endoscopy;  Laterality: N/A;  . NOSE SURGERY     for skin cancer  . POLYPECTOMY  07/28/2017   Procedure: POLYPECTOMY;  Surgeon: Rogene Houston, MD;  [facility];  Service: Endoscopy;;  colon     Current Outpatient Medications  Medication Sig Dispense Refill  . acetaminophen (TYLENOL) 325 MG tablet Take 650 mg by mouth every 6 (six) hours as needed for mild pain or moderate pain.    Marland Kitchen ALPRAZolam (XANAX) 0.25 MG tablet Take 1 tablet (0.25 mg total) by mouth 2 (two) times daily as needed. for anxiety 8 tablet 0  . ANTI-DIARRHEAL 2 MG tablet Take 1 tablet by mouth daily.    Marland Kitchen dicyclomine (BENTYL) 10 MG capsule Take 10 mg by mouth every 4 (four) hours as needed for spasms.    Marland Kitchen diltiazem (CARDIZEM SR) 90 MG 12 hr capsule Take 1 capsule (90 mg total) by mouth 2 (two) times daily. 60 capsule 0  . levothyroxine (SYNTHROID) 25 MCG tablet Take 25 mcg by mouth daily before breakfast.    . meclizine (ANTIVERT) 25 MG tablet Take 25 mg by mouth daily as needed for dizziness.     . metoprolol succinate (TOPROL-XL) 25 MG 24 hr tablet Take 1 tablet (25 mg total) by mouth daily.    . Multiple Vitamin (MULTIVITAMIN WITH MINERALS) TABS tablet Take 1 tablet by mouth daily.    . ondansetron (ZOFRAN) 4 MG tablet Take 4 mg by  mouth every 8 (eight) hours as needed for nausea or vomiting.    Marland Kitchen oxyCODONE-acetaminophen (PERCOCET/ROXICET) 5-325 MG tablet Take 1-2 tablets by mouth as needed.     . potassium chloride SA (KLOR-CON) 20 MEQ tablet Take 20 mEq by mouth daily.    . pregabalin (LYRICA) 25 MG capsule Take 1 capsule (25 mg total) by mouth 2 (two) times daily. 60 capsule 1  . prochlorperazine (COMPAZINE) 10 MG tablet Take 10 mg by mouth every 4 (four) hours as needed.    . torsemide (DEMADEX) 20 MG tablet Take 40 mg in the AM & Take 20 mg in the PM. 270 tablet 1  . traMADol (ULTRAM) 50 MG tablet Take by mouth every 6 (six) hours as needed for moderate pain.    . vitamin B-12 (CYANOCOBALAMIN) 500 MCG tablet Take 1 tablet (500 mcg total) by mouth daily.     No current facility-administered medications for this visit.  Allergies:  Clonazepam, Codeine, and Sulfa antibiotics   Social History: The patient  reports that she has never smoked. She has never used smokeless tobacco. She reports that she does not drink alcohol and does not use drugs.   Family History: The patient's family history includes Heart attack in her father.   ROS:  Please see the history of present illness. Otherwise, complete review of systems is positive for none.  All other systems are reviewed and negative.   Physical Exam: VS:  BP 108/82   Pulse 66   Ht 5\' 6"  (1.676 m)   Wt 119 lb (54 kg)   SpO2 97%   BMI 19.21 kg/m , BMI Body mass index is 19.21 kg/m.  Wt Readings from Last 3 Encounters:  01/28/20 119 lb (54 kg)  10/28/19 115 lb (52.2 kg)  10/18/19 112 lb (50.8 kg)    General: Patient appears comfortable at rest. Neck: Supple, no elevated JVP or carotid bruits, no thyromegaly. Lungs: Clear to auscultation, nonlabored breathing at rest. Cardiac: irregularly irregular rate and rhythm, no S3 or significant systolic murmur, no pericardial rub. Extremities: No pitting edema, distal pulses 2+. Skin: Warm and dry. Musculoskeletal: No  kyphosis. Neuropsychiatric: Alert and oriented x3, affect grossly appropriate.  ECG:  EKG 10/18/2019 showed atrial fibrillation rate of 74 right bundle branch block and left anterior fascicular block.  Recent Labwork: 03/18/2019: TSH 13.447 04/26/2019: B Natriuretic Peptide 947.0 05/04/2019: Magnesium 2.1 10/18/2019: ALT 33; AST 34; BUN 22; Creatinine, Ser 1.11; Hemoglobin 11.9; Platelets 239; Potassium 4.1; Sodium 139  No results found for: CHOL, TRIG, HDL, CHOLHDL, VLDL, LDLCALC, LDLDIRECT     Other Studies Reviewed Today:  Echocardiogram 04/07/2019 at [facility]  1. Technically difficult study due to patient position.  2. The left ventricle is normal in size with mildly increased wall  thickness.  3. The left ventricular systolic function is normal, LVEF is visually  estimated at 60-65%.  4. The left atrium is severely dilated in size.  5. There is severe mostly posteriorly directed mitral regurgitation.  6. The right ventricle is normal in size, with normal systolic function.  7. The right atrium is moderately to severely dilated in size.  8. There is moderate to severe tricuspid regurgitation.  9. There is mild-moderate pulmonary hypertension, estimated pulmonary artery  systolic pressure is 48 mmHg.       Echocardiogram: 12/2018 IMPRESSIONS  1. Left ventricular ejection fraction, by visual estimation, is 60 to  65%. The left ventricle has normal function. There is no left ventricular  hypertrophy.  2. Elevated left atrial pressure.  3. Left ventricular diastolic parameters are consistent with Grade II  diastolic dysfunction (pseudonormalization).  4. Global right ventricle has normal systolic function.The right  ventricular size is normal. No increase in right ventricular wall  thickness.  5. Left atrial size was severely dilated.  6. Right atrial size was mildly dilated.  7. The mitral valve is abnormal. Moderate mitral valve  regurgitation. No  evidence of mitral stenosis.  8. The MR vena contracta is 0.6 cm. The MV/AV TVI ratio is 1.1. Findings  support moderate mitral regurgitation.  9. The tricuspid valve is normal in structure. Tricuspid valve  regurgitation is mild.  10. The aortic valve is tricuspid. Aortic valve regurgitation is not  visualized. No evidence of aortic valve sclerosis or stenosis.  11. The pulmonic valve was not well visualized. Pulmonic valve  regurgitation is mild.  12. Moderately elevated pulmonary artery systolic pressure.  13.  The inferior vena cava is normal in size with greater than 50%  respiratory variability, suggesting right atrial pressure of 3 mmHg.   Assessment and Plan:   1. Chronic diastolic heart failure (HCC) Echocardiogram October 2020: EF 60 to 65%, elevated LA pressure, G2 DD, severe LAE dilation, mild RA dilation, moderate MR, mild TR, mild pulmonic regurgitation, moderately elevated PASP.  Echocardiogram April 07, 2019 at [facility] EF 60 to 65%, severe mostly posteriorly directed mitral regurgitation.  Moderate to severe tricuspid regurgitation.  Mild to moderate pulmonary hypertension with PASP 48 mmHg.  Continue torsemide 40 mg a.m. and 20 mg p.m. metoprolol 25 mg daily.   2. Persistent atrial fibrillation ([place]) Continues in atrial fibrillation with rate controlled.  Continue diltiazem SR 90 mg po bid .  Continue Toprol XL 25 mg daily. Not on anticoagulation d/t hx of GIB  3. Essential hypertension Blood pressure well controlled today 108/82.  4. Anemia, unspecified type Hemoglobin 11.9, hematocrit 37.5.  No complaints of active bleeding or black tarry stools.  5. Stage 3 chronic kidney disease, unspecified whether stage 3a or 3b CKD Recent Crt 1.11 GFR 44   6.  Severe mitral regurgitation Most recent echo on 04/07/2019 showed severe posteriorly directed mitral regurgitation.  Patient is asymptomatic.  Medication Adjustments/Labs and Tests  Ordered: Current medicines are reviewed at length with the patient today.  Concerns regarding medicines are outlined above.   Disposition: Follow-up with Dr. Domenic Polite or APP 6 months.  Signed, Levell July, NP 01/28/2020 10:31 AM    [facility] at [place], [place], [place] [address] Phone: [phone]; Fax: [phone]

## 2020-01-28 ENCOUNTER — Encounter: Payer: Self-pay | Admitting: Family Medicine

## 2020-01-28 ENCOUNTER — Other Ambulatory Visit: Payer: Self-pay

## 2020-01-28 ENCOUNTER — Ambulatory Visit (INDEPENDENT_AMBULATORY_CARE_PROVIDER_SITE_OTHER): Payer: Medicare Other | Admitting: Family Medicine

## 2020-01-28 ENCOUNTER — Encounter: Payer: Self-pay | Admitting: *Deleted

## 2020-01-28 VITALS — BP 108/82 | HR 66 | Ht 66.0 in | Wt 119.0 lb

## 2020-01-28 DIAGNOSIS — N183 Chronic kidney disease, stage 3 unspecified: Secondary | ICD-10-CM

## 2020-01-28 DIAGNOSIS — I5032 Chronic diastolic (congestive) heart failure: Secondary | ICD-10-CM

## 2020-01-28 DIAGNOSIS — D649 Anemia, unspecified: Secondary | ICD-10-CM

## 2020-01-28 DIAGNOSIS — I10 Essential (primary) hypertension: Secondary | ICD-10-CM

## 2020-01-28 DIAGNOSIS — I4819 Other persistent atrial fibrillation: Secondary | ICD-10-CM | POA: Diagnosis not present

## 2020-01-28 DIAGNOSIS — I340 Nonrheumatic mitral (valve) insufficiency: Secondary | ICD-10-CM | POA: Diagnosis not present

## 2020-01-28 NOTE — Patient Instructions (Signed)
Medication Instructions:  Continue all current medications.   Labwork: none  Testing/Procedures: none  Follow-Up: 6 months   Any Other Special Instructions Will Be Listed Below (If Applicable).   If you need a refill on your cardiac medications before your next appointment, please call your pharmacy.  

## 2020-01-31 ENCOUNTER — Other Ambulatory Visit: Payer: Self-pay

## 2020-01-31 ENCOUNTER — Encounter (INDEPENDENT_AMBULATORY_CARE_PROVIDER_SITE_OTHER): Payer: Self-pay | Admitting: Gastroenterology

## 2020-01-31 ENCOUNTER — Ambulatory Visit (INDEPENDENT_AMBULATORY_CARE_PROVIDER_SITE_OTHER): Payer: Medicare Other | Admitting: Gastroenterology

## 2020-01-31 VITALS — BP 143/84 | HR 87 | Temp 97.7°F | Ht 66.0 in | Wt 121.5 lb

## 2020-01-31 DIAGNOSIS — K625 Hemorrhage of anus and rectum: Secondary | ICD-10-CM | POA: Diagnosis not present

## 2020-01-31 NOTE — Progress Notes (Signed)
Renee Blake, M.D. Gastroenterology & Hepatology [facility] [address] [place], [place] [address]  Primary Care Physician: [name], MD [place] [id number]  I will communicate my assessment and recommendations to the referring MD via EMR. "Note: Occasional unusual wording and randomly placed punctuation marks may result from the use of speech recognition technology to transcribe this document"  Problems: 1. Rectal bleeding  2. 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 female with past medical history of atrial fibrillation, esophageal stricture, hypertension, hyperlipidemia, ?  Microscopic colitis, and history of large squamous cell carcinoma in the distal rectum of unknown origin status post radiation therapy, who presents for evaluation of recurrent rectal bleeding.  Patient reports that for the last month and a half she has been presenting intermittent episodes of rectal bleeding, about 3 times per week. She describes the amount of blood has been increasing although it is not a large amount of blood. No dyschezia. She does no have to strain to have a bowel medication. She has had a long standing history of diarrhea, which has remained the same for years. She has had chronic back pain for multiple years. The patient denies having any nausea, vomiting, fever, chills, hematemesis, abdominal distention, abdominal pain, diarrhea, jaundice, pruritus or weight loss.  Patient denies taking any NSAIDs or anticoagulants.  Of note, the patient was found to have similar symptoms 2 years ago for which she underwent a colonoscopy and was found to have a mass in the distal rectum which was biopsied and showed squamous cell carcinoma.  She did not undergo surgery for the tumor  given her cardiac comorbidities.  Never got CTX. She was told that it was in remission after receiving the RTX. Last time she received radiation for her SCC of the rectum was close to 1 year ago.States that the radiation improved her rectal bleeding, which happened very rarely in the interim between CTX and her current presentation.  Social: neg smoking, alcohol or illicit drug use Surgical: non contributory  Past Medical History: Past Medical History:  Diagnosis Date  . Acute blood loss anemia   . Atrial fibrillation ([place])    a. diagnosed in 12/2018. Rate-control pursued given not a candidate for anticoagulation  . Chronic diarrhea   . Dilation of biliary tract   . Duodenal stricture   . Esophageal stricture   . Gastric ulcer   . GI bleed   . Hyperlipemia   . Hypertension   . Microscopic colitis   . Nephrolithiasis   . Pancreatic duct dilated   . Schatzki's ring   . Skin cancer   . Sliding hiatal hernia   . Vertigo     Past Surgical History: Past Surgical History:  Procedure Laterality Date  . BIOPSY  07/28/2017   Procedure: BIOPSY;  Surgeon: Rogene Houston, MD;  [facility]: [facility];  Service: Endoscopy;;  rectum  . COLONOSCOPY N/A 07/28/2017   Procedure: COLONOSCOPY;  Surgeon: Rogene Houston, MD;  [facility]: [facility];  Service: Endoscopy;  Laterality: N/A;  . ESOPHAGOGASTRODUODENOSCOPY N/A 02/27/2013   Procedure: ESOPHAGOGASTRODUODENOSCOPY (EGD);  Surgeon: Ladene Artist, MD;  Location: [facility] ENDOSCOPY;  Service: Endoscopy;  Laterality: N/A;  . ESOPHAGOGASTRODUODENOSCOPY N/A 03/28/2013   Procedure: ESOPHAGOGASTRODUODENOSCOPY (EGD);  Surgeon: Rogene Houston, MD;  [facility]:  [facility];  Service: Endoscopy;  Laterality: N/A;  . HOT HEMOSTASIS N/A 02/27/2013   Procedure: HOT HEMOSTASIS (ARGON PLASMA COAGULATION/BICAP);  Surgeon: Ladene Artist, MD;  Location[facility] ENDOSCOPY;  Service: Endoscopy;  Laterality: N/A;  . NOSE SURGERY     for skin cancer  . POLYPECTOMY   07/28/2017   Procedure: POLYPECTOMY;  Surgeon: Rogene Houston, MD;  Location: [facility];  Service: Endoscopy;;  colon     Family History: Family History  Problem Relation Age of Onset  . Heart attack Father     Social History: Social History   Tobacco Use  Smoking Status Never Smoker  Smokeless Tobacco Never Used   Social History   Substance and Sexual Activity  Alcohol Use No  . Alcohol/week: 0.0 standard drinks   Social History   Substance and Sexual Activity  Drug Use No    Allergies: Allergies  Allergen Reactions  . Clonazepam Other (See Comments)    Pruritis  . Codeine Nausea And Vomiting and Other (See Comments)    Reaction unknown  . Sulfa Antibiotics Other (See Comments)    Reactions unknown    Medications: Current Outpatient Medications  Medication Sig Dispense Refill  . acetaminophen (TYLENOL) 325 MG tablet Take 650 mg by mouth every 6 (six) hours as needed for mild pain or moderate pain.    Marland Kitchen ALPRAZolam (XANAX) 0.25 MG tablet Take 1 tablet (0.25 mg total) by mouth 2 (two) times daily as needed. for anxiety 8 tablet 0  . ANTI-DIARRHEAL 2 MG tablet Take 1 tablet by mouth daily.    Marland Kitchen dicyclomine (BENTYL) 10 MG capsule Take 10 mg by mouth every 4 (four) hours as needed for spasms.    Marland Kitchen diltiazem (CARDIZEM SR) 90 MG 12 hr capsule Take 1 capsule (90 mg total) by mouth 2 (two) times daily. 60 capsule 0  . levothyroxine (SYNTHROID) 25 MCG tablet Take 25 mcg by mouth daily before breakfast.    . meclizine (ANTIVERT) 25 MG tablet Take 25 mg by mouth daily as needed for dizziness.     . Multiple Vitamin (MULTIVITAMIN WITH MINERALS) TABS tablet Take 1 tablet by mouth daily.    . ondansetron (ZOFRAN) 4 MG tablet Take 4 mg by mouth every 8 (eight) hours as needed for nausea or vomiting.    . potassium chloride SA (KLOR-CON) 20 MEQ tablet Take 20 mEq by mouth daily.    . pregabalin (LYRICA) 25 MG capsule Take 1 capsule (25 mg total) by mouth 2 (two) times daily.  60 capsule 1  . prochlorperazine (COMPAZINE) 10 MG tablet Take 10 mg by mouth every 4 (four) hours as needed.    . torsemide (DEMADEX) 20 MG tablet Take 40 mg in the AM & Take 20 mg in the PM. 270 tablet 1  . vitamin B-12 (CYANOCOBALAMIN) 500 MCG tablet Take 1 tablet (500 mcg total) by mouth daily.    . metoprolol succinate (TOPROL-XL) 25 MG 24 hr tablet Take 1 tablet (25 mg total) by mouth daily.     No current facility-administered medications for this visit.    Review of Systems: GENERAL: negative for malaise, night sweats HEENT: No changes in hearing or vision, no nose bleeds or other nasal problems. NECK: Negative for lumps, goiter, pain and significant neck swelling RESPIRATORY: Negative for cough, wheezing CARDIOVASCULAR: Negative for chest pain, leg swelling, palpitations, orthopnea GI: SEE HPI MUSCULOSKELETAL: Negative for joint pain or swelling, back pain, and muscle pain. SKIN: Negative for lesions,  rash PSYCH: Negative for sleep disturbance, mood disorder and recent psychosocial stressors. HEMATOLOGY Negative for prolonged bleeding, bruising easily, and swollen nodes. ENDOCRINE: Negative for cold or heat intolerance, polyuria, polydipsia and goiter. NEURO: negative for tremor, gait imbalance, syncope and seizures. The remainder of the review of systems is noncontributory.   Physical Exam: BP (!) 143/84 (BP Location: Right Arm, Patient Position: Sitting, Cuff Size: Small)   Pulse 87   Temp 97.7 F (36.5 C) (Oral)   Ht 5\' 6"  (1.676 m)   Wt 121 lb 8 oz (55.1 kg)   BMI 19.61 kg/m  GENERAL: The patient is AO x3, in no acute distress.  Elder. HEENT: Head is normocephalic and atraumatic. EOMI are intact. Mouth is well hydrated and without lesions. NECK: Supple. No masses LUNGS: Clear to auscultation. No presence of rhonchi/wheezing/rales. Adequate chest expansion HEART: RRR, normal s1 and s2. ABDOMEN: Soft, nontender, no guarding, no peritoneal signs, and nondistended. BS  +. No masses. EXTREMITIES: Without any cyanosis, clubbing, rash, lesions or edema. NEUROLOGIC: AOx3, no focal motor deficit. SKIN: no jaundice, no rashes  Imaging/Labs: as above  I personally reviewed and interpreted the available labs, imaging and endoscopic files.  Impression and Plan: Renee Blake is a 86 y.o. female with past medical history of atrial fibrillation, esophageal stricture, hypertension, hyperlipidemia, ?  Microscopic colitis, and history of large squamous cell carcinoma in the distal rectum of unknown origin status post radiation therapy, who presents for evaluation of recurrent rectal bleeding.  The patient has presented recurrent episodes of rectal bleeding recently.  Given her previous history, the differential diagnosis for her presentation includes recurrent squamous cell carcinoma in the rectum versus possible radiation proctitis, less likely due to AVMs or hemorrhoids.  At this point it would be important to explore again with a colonoscopy the etiology of her rectal bleeding.  However, she has a significant cardiac disease, which may increase her risk for complications while undergoing propofol sedation.  Due to this, will discuss her case with her cardiologist to obtain clearance before proceeding with this.  I explained to them that another option is to perform an unsedated colonoscopy if she is considered to be a high risk to undergo propofol sedation.  The patient and the family member understood and agreed.  - To discuss cardiology clearance with cardiologist before scheduling colonoscopy   All questions were answered.      Harvel Quale, MD Gastroenterology and Hepatology [facility]

## 2020-01-31 NOTE — Patient Instructions (Signed)
We will reach you regarding colonoscopy scheduling once cardiac clearance has been obtained by your cardiologist

## 2020-02-01 ENCOUNTER — Ambulatory Visit (INDEPENDENT_AMBULATORY_CARE_PROVIDER_SITE_OTHER): Payer: Medicare Other | Admitting: Gastroenterology

## 2020-02-01 ENCOUNTER — Telehealth: Payer: Self-pay | Admitting: Cardiology

## 2020-02-01 ENCOUNTER — Other Ambulatory Visit (INDEPENDENT_AMBULATORY_CARE_PROVIDER_SITE_OTHER): Payer: Self-pay | Admitting: Gastroenterology

## 2020-02-01 NOTE — Telephone Encounter (Signed)
Hi Ann,  Per Dr. Chuck Hint evaluation, the patient is stable from a cardiac perspective and we can proceed with scheduling her colonoscopy.  It would be in room 3.  Diagnosis is rectal bleeding.  Can you please schedule this for me?   thank you,  [name], MD Gastroenterology and Hepatology [facility]

## 2020-02-01 NOTE — Telephone Encounter (Signed)
Good evening,  Hope this finds you well.  I had the pleasure to taking care of Renee Blake, who came to my clinic for evaluation of rectal bleeding.  As you are aware she has significant cardiac comorbidities, for which I would like to request cardiac clearance to undergo a colonoscopy under propofol for evaluation of her recurrent episode of rectal bleeding.  If her risk is too high, I discussed with her the possibility of doing an unsedated colonoscopy but would appreciate your input before scheduling the procedure.   Thanks,  [name], MD Gastroenterology and Hepatology [facility]   Thank you for the question.  Renee Blake is a former patient of Dr. Bronson Ing that I have not met in clinic as yet.  She was just recently seen by Mr. Leonides Sake NP and per review of his note was clinically stable from a cardiac perspective.  She has a history of diastolic heart failure, moderate to severe mitral regurgitation with moderately elevated pulmonary artery systolic pressures, also atrial fibrillation.  She has been relatively frail with hospitalizations noted over the last year (at one point was under hospice care), acute on chronic renal insufficiency, and fluctuating fluid status.  In general, endoscopic procedures with sedation are not high risk from a cardiac perspective, even in patients with significant cardiac disease.  This presumes that they are clinically stable and are able to tolerate the sedation.  If you were to pursue a colonoscopy without sedation, I would think that that would be more stressful on her cardiac status than if she is lightly but reasonably sedated with propofol or Versed.  The bigger question is what information is going to be gained from putting her through a colonoscopy.  If she has hemorrhoids or polyps that are bleeding, I suppose these could potentially be managed endoscopically and help reduce her bleeding.  But if she has a tumor or  mass, she would not be a candidate for operative resection.  We already do not have her on anticoagulation for her atrial fibrillation.  From a cardiac periprocedural risk perspective, she would be at least in the intermediate range based on her age and frailty as well as comorbid status, but this risk would not necessarily preclude you pursuing a cardiac colonoscopy under sedation if this is felt to be necessary and helpful in her management.  This of course assumes that the patient and her family have been apprised of all these risks and are in agreement for her to proceed.  Satira Sark, M.D., F.A.C.C.

## 2020-02-02 ENCOUNTER — Encounter (INDEPENDENT_AMBULATORY_CARE_PROVIDER_SITE_OTHER): Payer: Self-pay | Admitting: *Deleted

## 2020-02-02 ENCOUNTER — Other Ambulatory Visit (INDEPENDENT_AMBULATORY_CARE_PROVIDER_SITE_OTHER): Payer: Self-pay | Admitting: *Deleted

## 2020-02-02 ENCOUNTER — Telehealth (INDEPENDENT_AMBULATORY_CARE_PROVIDER_SITE_OTHER): Payer: Self-pay | Admitting: *Deleted

## 2020-02-02 DIAGNOSIS — I130 Hypertensive heart and chronic kidney disease with heart failure and stage 1 through stage 4 chronic kidney disease, or unspecified chronic kidney disease: Secondary | ICD-10-CM | POA: Diagnosis not present

## 2020-02-02 DIAGNOSIS — R42 Dizziness and giddiness: Secondary | ICD-10-CM | POA: Diagnosis not present

## 2020-02-02 DIAGNOSIS — K219 Gastro-esophageal reflux disease without esophagitis: Secondary | ICD-10-CM | POA: Diagnosis not present

## 2020-02-02 DIAGNOSIS — E039 Hypothyroidism, unspecified: Secondary | ICD-10-CM | POA: Diagnosis not present

## 2020-02-02 DIAGNOSIS — J9601 Acute respiratory failure with hypoxia: Secondary | ICD-10-CM | POA: Diagnosis not present

## 2020-02-02 DIAGNOSIS — Z9181 History of falling: Secondary | ICD-10-CM | POA: Diagnosis not present

## 2020-02-02 DIAGNOSIS — N1832 Chronic kidney disease, stage 3b: Secondary | ICD-10-CM | POA: Diagnosis not present

## 2020-02-02 DIAGNOSIS — I482 Chronic atrial fibrillation, unspecified: Secondary | ICD-10-CM | POA: Diagnosis not present

## 2020-02-02 DIAGNOSIS — Z9981 Dependence on supplemental oxygen: Secondary | ICD-10-CM | POA: Diagnosis not present

## 2020-02-02 DIAGNOSIS — I5033 Acute on chronic diastolic (congestive) heart failure: Secondary | ICD-10-CM | POA: Diagnosis not present

## 2020-02-02 DIAGNOSIS — D631 Anemia in chronic kidney disease: Secondary | ICD-10-CM | POA: Diagnosis not present

## 2020-02-02 MED ORDER — SUPREP BOWEL PREP KIT 17.5-3.13-1.6 GM/177ML PO SOLN: 1.0000 | Freq: Once | ORAL | 0 refills | Status: AC

## 2020-02-02 NOTE — Telephone Encounter (Signed)
Patient needs suprep 

## 2020-02-02 NOTE — Telephone Encounter (Signed)
TCS sch'd 03/03/20, patient's daughter aware, instructions mailed

## 2020-02-02 NOTE — Telephone Encounter (Signed)
Thanks

## 2020-02-03 DIAGNOSIS — N1832 Chronic kidney disease, stage 3b: Secondary | ICD-10-CM | POA: Diagnosis not present

## 2020-02-03 DIAGNOSIS — Z9181 History of falling: Secondary | ICD-10-CM | POA: Diagnosis not present

## 2020-02-03 DIAGNOSIS — E039 Hypothyroidism, unspecified: Secondary | ICD-10-CM | POA: Diagnosis not present

## 2020-02-03 DIAGNOSIS — J9601 Acute respiratory failure with hypoxia: Secondary | ICD-10-CM | POA: Diagnosis not present

## 2020-02-03 DIAGNOSIS — K219 Gastro-esophageal reflux disease without esophagitis: Secondary | ICD-10-CM | POA: Diagnosis not present

## 2020-02-03 DIAGNOSIS — Z9981 Dependence on supplemental oxygen: Secondary | ICD-10-CM | POA: Diagnosis not present

## 2020-02-03 DIAGNOSIS — D631 Anemia in chronic kidney disease: Secondary | ICD-10-CM | POA: Diagnosis not present

## 2020-02-03 DIAGNOSIS — R42 Dizziness and giddiness: Secondary | ICD-10-CM | POA: Diagnosis not present

## 2020-02-03 DIAGNOSIS — I130 Hypertensive heart and chronic kidney disease with heart failure and stage 1 through stage 4 chronic kidney disease, or unspecified chronic kidney disease: Secondary | ICD-10-CM | POA: Diagnosis not present

## 2020-02-03 DIAGNOSIS — I5033 Acute on chronic diastolic (congestive) heart failure: Secondary | ICD-10-CM | POA: Diagnosis not present

## 2020-02-03 DIAGNOSIS — I482 Chronic atrial fibrillation, unspecified: Secondary | ICD-10-CM | POA: Diagnosis not present

## 2020-02-04 DIAGNOSIS — N1832 Chronic kidney disease, stage 3b: Secondary | ICD-10-CM | POA: Diagnosis not present

## 2020-02-04 DIAGNOSIS — R42 Dizziness and giddiness: Secondary | ICD-10-CM | POA: Diagnosis not present

## 2020-02-04 DIAGNOSIS — D631 Anemia in chronic kidney disease: Secondary | ICD-10-CM | POA: Diagnosis not present

## 2020-02-04 DIAGNOSIS — K219 Gastro-esophageal reflux disease without esophagitis: Secondary | ICD-10-CM | POA: Diagnosis not present

## 2020-02-04 DIAGNOSIS — Z9981 Dependence on supplemental oxygen: Secondary | ICD-10-CM | POA: Diagnosis not present

## 2020-02-04 DIAGNOSIS — I130 Hypertensive heart and chronic kidney disease with heart failure and stage 1 through stage 4 chronic kidney disease, or unspecified chronic kidney disease: Secondary | ICD-10-CM | POA: Diagnosis not present

## 2020-02-04 DIAGNOSIS — I482 Chronic atrial fibrillation, unspecified: Secondary | ICD-10-CM | POA: Diagnosis not present

## 2020-02-04 DIAGNOSIS — Z9181 History of falling: Secondary | ICD-10-CM | POA: Diagnosis not present

## 2020-02-04 DIAGNOSIS — E039 Hypothyroidism, unspecified: Secondary | ICD-10-CM | POA: Diagnosis not present

## 2020-02-04 DIAGNOSIS — J9601 Acute respiratory failure with hypoxia: Secondary | ICD-10-CM | POA: Diagnosis not present

## 2020-02-04 DIAGNOSIS — I5033 Acute on chronic diastolic (congestive) heart failure: Secondary | ICD-10-CM | POA: Diagnosis not present

## 2020-02-07 DIAGNOSIS — K219 Gastro-esophageal reflux disease without esophagitis: Secondary | ICD-10-CM | POA: Diagnosis not present

## 2020-02-07 DIAGNOSIS — I5033 Acute on chronic diastolic (congestive) heart failure: Secondary | ICD-10-CM | POA: Diagnosis not present

## 2020-02-07 DIAGNOSIS — D631 Anemia in chronic kidney disease: Secondary | ICD-10-CM | POA: Diagnosis not present

## 2020-02-07 DIAGNOSIS — Z9181 History of falling: Secondary | ICD-10-CM | POA: Diagnosis not present

## 2020-02-07 DIAGNOSIS — Z9981 Dependence on supplemental oxygen: Secondary | ICD-10-CM | POA: Diagnosis not present

## 2020-02-07 DIAGNOSIS — J9601 Acute respiratory failure with hypoxia: Secondary | ICD-10-CM | POA: Diagnosis not present

## 2020-02-07 DIAGNOSIS — I482 Chronic atrial fibrillation, unspecified: Secondary | ICD-10-CM | POA: Diagnosis not present

## 2020-02-07 DIAGNOSIS — I130 Hypertensive heart and chronic kidney disease with heart failure and stage 1 through stage 4 chronic kidney disease, or unspecified chronic kidney disease: Secondary | ICD-10-CM | POA: Diagnosis not present

## 2020-02-07 DIAGNOSIS — N1832 Chronic kidney disease, stage 3b: Secondary | ICD-10-CM | POA: Diagnosis not present

## 2020-02-07 DIAGNOSIS — R42 Dizziness and giddiness: Secondary | ICD-10-CM | POA: Diagnosis not present

## 2020-02-07 DIAGNOSIS — E039 Hypothyroidism, unspecified: Secondary | ICD-10-CM | POA: Diagnosis not present

## 2020-02-08 DIAGNOSIS — K219 Gastro-esophageal reflux disease without esophagitis: Secondary | ICD-10-CM | POA: Diagnosis not present

## 2020-02-08 DIAGNOSIS — R42 Dizziness and giddiness: Secondary | ICD-10-CM | POA: Diagnosis not present

## 2020-02-08 DIAGNOSIS — I5033 Acute on chronic diastolic (congestive) heart failure: Secondary | ICD-10-CM | POA: Diagnosis not present

## 2020-02-08 DIAGNOSIS — D631 Anemia in chronic kidney disease: Secondary | ICD-10-CM | POA: Diagnosis not present

## 2020-02-08 DIAGNOSIS — Z9181 History of falling: Secondary | ICD-10-CM | POA: Diagnosis not present

## 2020-02-08 DIAGNOSIS — N1832 Chronic kidney disease, stage 3b: Secondary | ICD-10-CM | POA: Diagnosis not present

## 2020-02-08 DIAGNOSIS — I482 Chronic atrial fibrillation, unspecified: Secondary | ICD-10-CM | POA: Diagnosis not present

## 2020-02-08 DIAGNOSIS — Z9981 Dependence on supplemental oxygen: Secondary | ICD-10-CM | POA: Diagnosis not present

## 2020-02-08 DIAGNOSIS — J9601 Acute respiratory failure with hypoxia: Secondary | ICD-10-CM | POA: Diagnosis not present

## 2020-02-08 DIAGNOSIS — E039 Hypothyroidism, unspecified: Secondary | ICD-10-CM | POA: Diagnosis not present

## 2020-02-08 DIAGNOSIS — I130 Hypertensive heart and chronic kidney disease with heart failure and stage 1 through stage 4 chronic kidney disease, or unspecified chronic kidney disease: Secondary | ICD-10-CM | POA: Diagnosis not present

## 2020-02-10 DIAGNOSIS — K219 Gastro-esophageal reflux disease without esophagitis: Secondary | ICD-10-CM | POA: Diagnosis not present

## 2020-02-10 DIAGNOSIS — J9601 Acute respiratory failure with hypoxia: Secondary | ICD-10-CM | POA: Diagnosis not present

## 2020-02-10 DIAGNOSIS — I130 Hypertensive heart and chronic kidney disease with heart failure and stage 1 through stage 4 chronic kidney disease, or unspecified chronic kidney disease: Secondary | ICD-10-CM | POA: Diagnosis not present

## 2020-02-10 DIAGNOSIS — E039 Hypothyroidism, unspecified: Secondary | ICD-10-CM | POA: Diagnosis not present

## 2020-02-10 DIAGNOSIS — D631 Anemia in chronic kidney disease: Secondary | ICD-10-CM | POA: Diagnosis not present

## 2020-02-10 DIAGNOSIS — N1832 Chronic kidney disease, stage 3b: Secondary | ICD-10-CM | POA: Diagnosis not present

## 2020-02-10 DIAGNOSIS — I482 Chronic atrial fibrillation, unspecified: Secondary | ICD-10-CM | POA: Diagnosis not present

## 2020-02-10 DIAGNOSIS — I5033 Acute on chronic diastolic (congestive) heart failure: Secondary | ICD-10-CM | POA: Diagnosis not present

## 2020-02-10 DIAGNOSIS — R42 Dizziness and giddiness: Secondary | ICD-10-CM | POA: Diagnosis not present

## 2020-02-10 DIAGNOSIS — Z9981 Dependence on supplemental oxygen: Secondary | ICD-10-CM | POA: Diagnosis not present

## 2020-02-10 DIAGNOSIS — Z9181 History of falling: Secondary | ICD-10-CM | POA: Diagnosis not present

## 2020-02-14 DIAGNOSIS — E039 Hypothyroidism, unspecified: Secondary | ICD-10-CM | POA: Diagnosis not present

## 2020-02-14 DIAGNOSIS — Z9981 Dependence on supplemental oxygen: Secondary | ICD-10-CM | POA: Diagnosis not present

## 2020-02-14 DIAGNOSIS — I130 Hypertensive heart and chronic kidney disease with heart failure and stage 1 through stage 4 chronic kidney disease, or unspecified chronic kidney disease: Secondary | ICD-10-CM | POA: Diagnosis not present

## 2020-02-14 DIAGNOSIS — J9601 Acute respiratory failure with hypoxia: Secondary | ICD-10-CM | POA: Diagnosis not present

## 2020-02-14 DIAGNOSIS — D631 Anemia in chronic kidney disease: Secondary | ICD-10-CM | POA: Diagnosis not present

## 2020-02-14 DIAGNOSIS — R42 Dizziness and giddiness: Secondary | ICD-10-CM | POA: Diagnosis not present

## 2020-02-14 DIAGNOSIS — N1832 Chronic kidney disease, stage 3b: Secondary | ICD-10-CM | POA: Diagnosis not present

## 2020-02-14 DIAGNOSIS — K219 Gastro-esophageal reflux disease without esophagitis: Secondary | ICD-10-CM | POA: Diagnosis not present

## 2020-02-14 DIAGNOSIS — I5033 Acute on chronic diastolic (congestive) heart failure: Secondary | ICD-10-CM | POA: Diagnosis not present

## 2020-02-14 DIAGNOSIS — Z9181 History of falling: Secondary | ICD-10-CM | POA: Diagnosis not present

## 2020-02-14 DIAGNOSIS — I482 Chronic atrial fibrillation, unspecified: Secondary | ICD-10-CM | POA: Diagnosis not present

## 2020-02-15 DIAGNOSIS — K219 Gastro-esophageal reflux disease without esophagitis: Secondary | ICD-10-CM | POA: Diagnosis not present

## 2020-02-15 DIAGNOSIS — D631 Anemia in chronic kidney disease: Secondary | ICD-10-CM | POA: Diagnosis not present

## 2020-02-15 DIAGNOSIS — N1832 Chronic kidney disease, stage 3b: Secondary | ICD-10-CM | POA: Diagnosis not present

## 2020-02-15 DIAGNOSIS — Z9181 History of falling: Secondary | ICD-10-CM | POA: Diagnosis not present

## 2020-02-15 DIAGNOSIS — E039 Hypothyroidism, unspecified: Secondary | ICD-10-CM | POA: Diagnosis not present

## 2020-02-15 DIAGNOSIS — J9601 Acute respiratory failure with hypoxia: Secondary | ICD-10-CM | POA: Diagnosis not present

## 2020-02-15 DIAGNOSIS — I130 Hypertensive heart and chronic kidney disease with heart failure and stage 1 through stage 4 chronic kidney disease, or unspecified chronic kidney disease: Secondary | ICD-10-CM | POA: Diagnosis not present

## 2020-02-15 DIAGNOSIS — I5033 Acute on chronic diastolic (congestive) heart failure: Secondary | ICD-10-CM | POA: Diagnosis not present

## 2020-02-15 DIAGNOSIS — I482 Chronic atrial fibrillation, unspecified: Secondary | ICD-10-CM | POA: Diagnosis not present

## 2020-02-15 DIAGNOSIS — R42 Dizziness and giddiness: Secondary | ICD-10-CM | POA: Diagnosis not present

## 2020-02-15 DIAGNOSIS — Z9981 Dependence on supplemental oxygen: Secondary | ICD-10-CM | POA: Diagnosis not present

## 2020-02-17 DIAGNOSIS — K219 Gastro-esophageal reflux disease without esophagitis: Secondary | ICD-10-CM | POA: Diagnosis not present

## 2020-02-17 DIAGNOSIS — D631 Anemia in chronic kidney disease: Secondary | ICD-10-CM | POA: Diagnosis not present

## 2020-02-17 DIAGNOSIS — R42 Dizziness and giddiness: Secondary | ICD-10-CM | POA: Diagnosis not present

## 2020-02-17 DIAGNOSIS — Z9181 History of falling: Secondary | ICD-10-CM | POA: Diagnosis not present

## 2020-02-17 DIAGNOSIS — J9601 Acute respiratory failure with hypoxia: Secondary | ICD-10-CM | POA: Diagnosis not present

## 2020-02-17 DIAGNOSIS — E039 Hypothyroidism, unspecified: Secondary | ICD-10-CM | POA: Diagnosis not present

## 2020-02-17 DIAGNOSIS — I482 Chronic atrial fibrillation, unspecified: Secondary | ICD-10-CM | POA: Diagnosis not present

## 2020-02-17 DIAGNOSIS — I5033 Acute on chronic diastolic (congestive) heart failure: Secondary | ICD-10-CM | POA: Diagnosis not present

## 2020-02-17 DIAGNOSIS — N1832 Chronic kidney disease, stage 3b: Secondary | ICD-10-CM | POA: Diagnosis not present

## 2020-02-17 DIAGNOSIS — I130 Hypertensive heart and chronic kidney disease with heart failure and stage 1 through stage 4 chronic kidney disease, or unspecified chronic kidney disease: Secondary | ICD-10-CM | POA: Diagnosis not present

## 2020-02-17 DIAGNOSIS — Z9981 Dependence on supplemental oxygen: Secondary | ICD-10-CM | POA: Diagnosis not present

## 2020-02-18 DIAGNOSIS — Z9181 History of falling: Secondary | ICD-10-CM | POA: Diagnosis not present

## 2020-02-18 DIAGNOSIS — J9601 Acute respiratory failure with hypoxia: Secondary | ICD-10-CM | POA: Diagnosis not present

## 2020-02-18 DIAGNOSIS — Z9981 Dependence on supplemental oxygen: Secondary | ICD-10-CM | POA: Diagnosis not present

## 2020-02-18 DIAGNOSIS — N1832 Chronic kidney disease, stage 3b: Secondary | ICD-10-CM | POA: Diagnosis not present

## 2020-02-18 DIAGNOSIS — D631 Anemia in chronic kidney disease: Secondary | ICD-10-CM | POA: Diagnosis not present

## 2020-02-18 DIAGNOSIS — E039 Hypothyroidism, unspecified: Secondary | ICD-10-CM | POA: Diagnosis not present

## 2020-02-18 DIAGNOSIS — R42 Dizziness and giddiness: Secondary | ICD-10-CM | POA: Diagnosis not present

## 2020-02-18 DIAGNOSIS — I130 Hypertensive heart and chronic kidney disease with heart failure and stage 1 through stage 4 chronic kidney disease, or unspecified chronic kidney disease: Secondary | ICD-10-CM | POA: Diagnosis not present

## 2020-02-18 DIAGNOSIS — K219 Gastro-esophageal reflux disease without esophagitis: Secondary | ICD-10-CM | POA: Diagnosis not present

## 2020-02-18 DIAGNOSIS — I5033 Acute on chronic diastolic (congestive) heart failure: Secondary | ICD-10-CM | POA: Diagnosis not present

## 2020-02-18 DIAGNOSIS — I482 Chronic atrial fibrillation, unspecified: Secondary | ICD-10-CM | POA: Diagnosis not present

## 2020-02-21 DIAGNOSIS — D631 Anemia in chronic kidney disease: Secondary | ICD-10-CM | POA: Diagnosis not present

## 2020-02-21 DIAGNOSIS — I5033 Acute on chronic diastolic (congestive) heart failure: Secondary | ICD-10-CM | POA: Diagnosis not present

## 2020-02-21 DIAGNOSIS — Z9181 History of falling: Secondary | ICD-10-CM | POA: Diagnosis not present

## 2020-02-21 DIAGNOSIS — N1832 Chronic kidney disease, stage 3b: Secondary | ICD-10-CM | POA: Diagnosis not present

## 2020-02-21 DIAGNOSIS — Z9981 Dependence on supplemental oxygen: Secondary | ICD-10-CM | POA: Diagnosis not present

## 2020-02-21 DIAGNOSIS — K219 Gastro-esophageal reflux disease without esophagitis: Secondary | ICD-10-CM | POA: Diagnosis not present

## 2020-02-21 DIAGNOSIS — J9601 Acute respiratory failure with hypoxia: Secondary | ICD-10-CM | POA: Diagnosis not present

## 2020-02-21 DIAGNOSIS — R42 Dizziness and giddiness: Secondary | ICD-10-CM | POA: Diagnosis not present

## 2020-02-21 DIAGNOSIS — E039 Hypothyroidism, unspecified: Secondary | ICD-10-CM | POA: Diagnosis not present

## 2020-02-21 DIAGNOSIS — I482 Chronic atrial fibrillation, unspecified: Secondary | ICD-10-CM | POA: Diagnosis not present

## 2020-02-21 DIAGNOSIS — I130 Hypertensive heart and chronic kidney disease with heart failure and stage 1 through stage 4 chronic kidney disease, or unspecified chronic kidney disease: Secondary | ICD-10-CM | POA: Diagnosis not present

## 2020-02-22 DIAGNOSIS — Z9981 Dependence on supplemental oxygen: Secondary | ICD-10-CM | POA: Diagnosis not present

## 2020-02-22 DIAGNOSIS — E039 Hypothyroidism, unspecified: Secondary | ICD-10-CM | POA: Diagnosis not present

## 2020-02-22 DIAGNOSIS — D631 Anemia in chronic kidney disease: Secondary | ICD-10-CM | POA: Diagnosis not present

## 2020-02-22 DIAGNOSIS — I5033 Acute on chronic diastolic (congestive) heart failure: Secondary | ICD-10-CM | POA: Diagnosis not present

## 2020-02-22 DIAGNOSIS — K219 Gastro-esophageal reflux disease without esophagitis: Secondary | ICD-10-CM | POA: Diagnosis not present

## 2020-02-22 DIAGNOSIS — R42 Dizziness and giddiness: Secondary | ICD-10-CM | POA: Diagnosis not present

## 2020-02-22 DIAGNOSIS — I130 Hypertensive heart and chronic kidney disease with heart failure and stage 1 through stage 4 chronic kidney disease, or unspecified chronic kidney disease: Secondary | ICD-10-CM | POA: Diagnosis not present

## 2020-02-22 DIAGNOSIS — N1832 Chronic kidney disease, stage 3b: Secondary | ICD-10-CM | POA: Diagnosis not present

## 2020-02-22 DIAGNOSIS — I482 Chronic atrial fibrillation, unspecified: Secondary | ICD-10-CM | POA: Diagnosis not present

## 2020-02-22 DIAGNOSIS — J9601 Acute respiratory failure with hypoxia: Secondary | ICD-10-CM | POA: Diagnosis not present

## 2020-02-22 DIAGNOSIS — Z9181 History of falling: Secondary | ICD-10-CM | POA: Diagnosis not present

## 2020-02-23 DIAGNOSIS — N1832 Chronic kidney disease, stage 3b: Secondary | ICD-10-CM | POA: Diagnosis not present

## 2020-02-23 DIAGNOSIS — I5033 Acute on chronic diastolic (congestive) heart failure: Secondary | ICD-10-CM | POA: Diagnosis not present

## 2020-02-23 DIAGNOSIS — I130 Hypertensive heart and chronic kidney disease with heart failure and stage 1 through stage 4 chronic kidney disease, or unspecified chronic kidney disease: Secondary | ICD-10-CM | POA: Diagnosis not present

## 2020-02-23 DIAGNOSIS — E039 Hypothyroidism, unspecified: Secondary | ICD-10-CM | POA: Diagnosis not present

## 2020-02-23 DIAGNOSIS — Z9981 Dependence on supplemental oxygen: Secondary | ICD-10-CM | POA: Diagnosis not present

## 2020-02-23 DIAGNOSIS — J9601 Acute respiratory failure with hypoxia: Secondary | ICD-10-CM | POA: Diagnosis not present

## 2020-02-23 DIAGNOSIS — K219 Gastro-esophageal reflux disease without esophagitis: Secondary | ICD-10-CM | POA: Diagnosis not present

## 2020-02-23 DIAGNOSIS — D631 Anemia in chronic kidney disease: Secondary | ICD-10-CM | POA: Diagnosis not present

## 2020-02-23 DIAGNOSIS — R42 Dizziness and giddiness: Secondary | ICD-10-CM | POA: Diagnosis not present

## 2020-02-23 DIAGNOSIS — Z9181 History of falling: Secondary | ICD-10-CM | POA: Diagnosis not present

## 2020-02-23 DIAGNOSIS — I482 Chronic atrial fibrillation, unspecified: Secondary | ICD-10-CM | POA: Diagnosis not present

## 2020-02-25 DIAGNOSIS — K219 Gastro-esophageal reflux disease without esophagitis: Secondary | ICD-10-CM | POA: Diagnosis not present

## 2020-02-25 DIAGNOSIS — Z9181 History of falling: Secondary | ICD-10-CM | POA: Diagnosis not present

## 2020-02-25 DIAGNOSIS — I130 Hypertensive heart and chronic kidney disease with heart failure and stage 1 through stage 4 chronic kidney disease, or unspecified chronic kidney disease: Secondary | ICD-10-CM | POA: Diagnosis not present

## 2020-02-25 DIAGNOSIS — I5033 Acute on chronic diastolic (congestive) heart failure: Secondary | ICD-10-CM | POA: Diagnosis not present

## 2020-02-25 DIAGNOSIS — J9601 Acute respiratory failure with hypoxia: Secondary | ICD-10-CM | POA: Diagnosis not present

## 2020-02-25 DIAGNOSIS — I482 Chronic atrial fibrillation, unspecified: Secondary | ICD-10-CM | POA: Diagnosis not present

## 2020-02-25 DIAGNOSIS — Z9981 Dependence on supplemental oxygen: Secondary | ICD-10-CM | POA: Diagnosis not present

## 2020-02-25 DIAGNOSIS — E039 Hypothyroidism, unspecified: Secondary | ICD-10-CM | POA: Diagnosis not present

## 2020-02-25 DIAGNOSIS — N1832 Chronic kidney disease, stage 3b: Secondary | ICD-10-CM | POA: Diagnosis not present

## 2020-02-25 DIAGNOSIS — R42 Dizziness and giddiness: Secondary | ICD-10-CM | POA: Diagnosis not present

## 2020-02-25 DIAGNOSIS — D631 Anemia in chronic kidney disease: Secondary | ICD-10-CM | POA: Diagnosis not present

## 2020-02-27 DIAGNOSIS — J9621 Acute and chronic respiratory failure with hypoxia: Secondary | ICD-10-CM | POA: Diagnosis not present

## 2020-02-29 DIAGNOSIS — D631 Anemia in chronic kidney disease: Secondary | ICD-10-CM | POA: Diagnosis not present

## 2020-02-29 DIAGNOSIS — E039 Hypothyroidism, unspecified: Secondary | ICD-10-CM | POA: Diagnosis not present

## 2020-02-29 DIAGNOSIS — I5033 Acute on chronic diastolic (congestive) heart failure: Secondary | ICD-10-CM | POA: Diagnosis not present

## 2020-02-29 DIAGNOSIS — I482 Chronic atrial fibrillation, unspecified: Secondary | ICD-10-CM | POA: Diagnosis not present

## 2020-02-29 DIAGNOSIS — J9601 Acute respiratory failure with hypoxia: Secondary | ICD-10-CM | POA: Diagnosis not present

## 2020-02-29 DIAGNOSIS — I130 Hypertensive heart and chronic kidney disease with heart failure and stage 1 through stage 4 chronic kidney disease, or unspecified chronic kidney disease: Secondary | ICD-10-CM | POA: Diagnosis not present

## 2020-02-29 DIAGNOSIS — K219 Gastro-esophageal reflux disease without esophagitis: Secondary | ICD-10-CM | POA: Diagnosis not present

## 2020-02-29 DIAGNOSIS — R42 Dizziness and giddiness: Secondary | ICD-10-CM | POA: Diagnosis not present

## 2020-02-29 DIAGNOSIS — N1832 Chronic kidney disease, stage 3b: Secondary | ICD-10-CM | POA: Diagnosis not present

## 2020-02-29 DIAGNOSIS — Z9181 History of falling: Secondary | ICD-10-CM | POA: Diagnosis not present

## 2020-02-29 DIAGNOSIS — Z9981 Dependence on supplemental oxygen: Secondary | ICD-10-CM | POA: Diagnosis not present

## 2020-02-29 NOTE — Patient Instructions (Signed)
Renee Blake  02/29/2020     @PREFPERIOPPHARMACY @   Your procedure is scheduled on  03/03/2020.  Report to [facility] at  1215  P.M.  Call this number if you have problems the morning of surgery:  [phone]   Remember:  Follow the diet and prep instructions given to you by office.                       Take these medicines the morning of surgery with A SIP OF WATER  Xanax(if needed), bentyl(if needed), diltiazem, levothyroxine, antivert(if needed), metoprolol, zofran(if needed), lyrica.    Do not wear jewelry, make-up or nail polish.  Do not wear lotions, powders, or perfumes. Please wear deodorant and brush your teeth.  Do not shave 48 hours prior to surgery.  Men may shave face and neck.  Do not bring valuables to the hospital.  [facility] is not responsible for any belongings or valuables.  Contacts, dentures or bridgework may not be worn into surgery.  Leave your suitcase in the car.  After surgery it may be brought to your room.  For patients admitted to the hospital, discharge time will be determined by your treatment team.  Patients discharged the day of surgery will not be allowed to drive home.   Name and phone number of your driver:   family Special instructions:  DO NOT smoke the morning of your procedure.  Please read over the following fact sheets that you were given. Anesthesia Post-op Instructions and Care and Recovery After Surgery       Colonoscopy, Adult, Care After This sheet gives you information about how to care for yourself after your procedure. Your health care provider may also give you more specific instructions. If you have problems or questions, contact your health care provider. What can I expect after the procedure? After the procedure, it is common to have:  A small amount of blood in your stool for 24 hours after the procedure.  Some gas.  Mild cramping or bloating of your abdomen. Follow these instructions at  home: Eating and drinking   Drink enough fluid to keep your urine pale yellow.  Follow instructions from your health care provider about eating or drinking restrictions.  Resume your normal diet as instructed by your health care provider. Avoid heavy or fried foods that are hard to digest. Activity  Rest as told by your health care provider.  Avoid sitting for a long time without moving. Get up to take short walks every 1-2 hours. This is important to improve blood flow and breathing. Ask for help if you feel weak or unsteady.  Return to your normal activities as told by your health care provider. Ask your health care provider what activities are safe for you. Managing cramping and bloating   Try walking around when you have cramps or feel bloated.  Apply heat to your abdomen as told by your health care provider. Use the heat source that your health care provider recommends, such as a moist heat pack or a heating pad. ? Place a towel between your skin and the heat source. ? Leave the heat on for 20-30 minutes. ? Remove the heat if your skin turns bright red. This is especially important if you are unable to feel pain, heat, or cold. You may have a greater risk of getting burned. General instructions  For the first 24 hours after the procedure: ? Do  not drive or use machinery. ? Do not sign important documents. ? Do not drink alcohol. ? Do your regular daily activities at a slower pace than normal. ? Eat soft foods that are easy to digest.  Take over-the-counter and prescription medicines only as told by your health care provider.  Keep all follow-up visits as told by your health care provider. This is important. Contact a health care provider if:  You have blood in your stool 2-3 days after the procedure. Get help right away if you have:  More than a small spotting of blood in your stool.  Large blood clots in your stool.  Swelling of your abdomen.  Nausea or  vomiting.  A fever.  Increasing pain in your abdomen that is not relieved with medicine. Summary  After the procedure, it is common to have a small amount of blood in your stool. You may also have mild cramping and bloating of your abdomen.  For the first 24 hours after the procedure, do not drive or use machinery, sign important documents, or drink alcohol.  Get help right away if you have a lot of blood in your stool, nausea or vomiting, a fever, or increased pain in your abdomen. This information is not intended to replace advice given to you by your health care provider. Make sure you discuss any questions you have with your health care provider. Document Revised: 10/12/2018 Document Reviewed: 10/12/2018 Elsevier Patient Education  [place] After These instructions provide you with information about caring for yourself after your procedure. Your health care provider may also give you more specific instructions. Your treatment has been planned according to current medical practices, but problems sometimes occur. Call your health care provider if you have any problems or questions after your procedure. What can I expect after the procedure? After your procedure, you may:  Feel sleepy for several hours.  Feel clumsy and have poor balance for several hours.  Feel forgetful about what happened after the procedure.  Have poor judgment for several hours.  Feel nauseous or vomit.  Have a sore throat if you had a breathing tube during the procedure. Follow these instructions at home: For at least 24 hours after the procedure:      Have a responsible adult stay with you. It is important to have someone help care for you until you are awake and alert.  Rest as needed.  Do not: ? Participate in activities in which you could fall or become injured. ? Drive. ? Use heavy machinery. ? Drink alcohol. ? Take sleeping pills or medicines that  cause drowsiness. ? Make important decisions or sign legal documents. ? Take care of children on your own. Eating and drinking  Follow the diet that is recommended by your health care provider.  If you vomit, drink water, juice, or soup when you can drink without vomiting.  Make sure you have little or no nausea before eating solid foods. General instructions  Take over-the-counter and prescription medicines only as told by your health care provider.  If you have sleep apnea, surgery and certain medicines can increase your risk for breathing problems. Follow instructions from your health care provider about wearing your sleep device: ? Anytime you are sleeping, including during daytime naps. ? While taking prescription pain medicines, sleeping medicines, or medicines that make you drowsy.  If you smoke, do not smoke without supervision.  Keep all follow-up visits as told by your health care provider.  This is important. Contact a health care provider if:  You keep feeling nauseous or you keep vomiting.  You feel light-headed.  You develop a rash.  You have a fever. Get help right away if:  You have trouble breathing. Summary  For several hours after your procedure, you may feel sleepy and have poor judgment.  Have a responsible adult stay with you for at least 24 hours or until you are awake and alert. This information is not intended to replace advice given to you by your health care provider. Make sure you discuss any questions you have with your health care provider. Document Revised: 06/16/2017 Document Reviewed: 07/09/2015 Elsevier Patient Education  [place].

## 2020-03-01 ENCOUNTER — Other Ambulatory Visit (HOSPITAL_COMMUNITY): Payer: Medicare Other

## 2020-03-01 ENCOUNTER — Encounter (HOSPITAL_COMMUNITY)
Admission: RE | Admit: 2020-03-01 | Discharge: 2020-03-01 | Disposition: A | Discharge status: Home / Self Care | Payer: Medicare Other | Source: Ambulatory Visit | Attending: Gastroenterology | Admitting: Gastroenterology

## 2020-03-01 ENCOUNTER — Encounter (HOSPITAL_COMMUNITY): Payer: Self-pay

## 2020-03-02 DIAGNOSIS — I5033 Acute on chronic diastolic (congestive) heart failure: Secondary | ICD-10-CM | POA: Diagnosis not present

## 2020-03-02 DIAGNOSIS — Z9981 Dependence on supplemental oxygen: Secondary | ICD-10-CM | POA: Diagnosis not present

## 2020-03-02 DIAGNOSIS — Z9181 History of falling: Secondary | ICD-10-CM | POA: Diagnosis not present

## 2020-03-02 DIAGNOSIS — I130 Hypertensive heart and chronic kidney disease with heart failure and stage 1 through stage 4 chronic kidney disease, or unspecified chronic kidney disease: Secondary | ICD-10-CM | POA: Diagnosis not present

## 2020-03-02 DIAGNOSIS — J9601 Acute respiratory failure with hypoxia: Secondary | ICD-10-CM | POA: Diagnosis not present

## 2020-03-02 DIAGNOSIS — R42 Dizziness and giddiness: Secondary | ICD-10-CM | POA: Diagnosis not present

## 2020-03-02 DIAGNOSIS — K219 Gastro-esophageal reflux disease without esophagitis: Secondary | ICD-10-CM | POA: Diagnosis not present

## 2020-03-02 DIAGNOSIS — I482 Chronic atrial fibrillation, unspecified: Secondary | ICD-10-CM | POA: Diagnosis not present

## 2020-03-02 DIAGNOSIS — D631 Anemia in chronic kidney disease: Secondary | ICD-10-CM | POA: Diagnosis not present

## 2020-03-02 DIAGNOSIS — N1832 Chronic kidney disease, stage 3b: Secondary | ICD-10-CM | POA: Diagnosis not present

## 2020-03-02 DIAGNOSIS — E039 Hypothyroidism, unspecified: Secondary | ICD-10-CM | POA: Diagnosis not present

## 2020-03-03 ENCOUNTER — Encounter (HOSPITAL_COMMUNITY): Admission: RE | Payer: Self-pay | Source: Ambulatory Visit

## 2020-03-03 ENCOUNTER — Ambulatory Visit (HOSPITAL_COMMUNITY): Admission: RE | Admit: 2020-03-03 | Payer: Medicare Other | Source: Ambulatory Visit | Admitting: Gastroenterology

## 2020-03-03 DIAGNOSIS — I482 Chronic atrial fibrillation, unspecified: Secondary | ICD-10-CM | POA: Diagnosis not present

## 2020-03-03 DIAGNOSIS — Z9181 History of falling: Secondary | ICD-10-CM | POA: Diagnosis not present

## 2020-03-03 DIAGNOSIS — R42 Dizziness and giddiness: Secondary | ICD-10-CM | POA: Diagnosis not present

## 2020-03-03 DIAGNOSIS — K219 Gastro-esophageal reflux disease without esophagitis: Secondary | ICD-10-CM | POA: Diagnosis not present

## 2020-03-03 DIAGNOSIS — E039 Hypothyroidism, unspecified: Secondary | ICD-10-CM | POA: Diagnosis not present

## 2020-03-03 DIAGNOSIS — J9601 Acute respiratory failure with hypoxia: Secondary | ICD-10-CM | POA: Diagnosis not present

## 2020-03-03 DIAGNOSIS — I130 Hypertensive heart and chronic kidney disease with heart failure and stage 1 through stage 4 chronic kidney disease, or unspecified chronic kidney disease: Secondary | ICD-10-CM | POA: Diagnosis not present

## 2020-03-03 DIAGNOSIS — I5033 Acute on chronic diastolic (congestive) heart failure: Secondary | ICD-10-CM | POA: Diagnosis not present

## 2020-03-03 DIAGNOSIS — N1832 Chronic kidney disease, stage 3b: Secondary | ICD-10-CM | POA: Diagnosis not present

## 2020-03-03 DIAGNOSIS — D631 Anemia in chronic kidney disease: Secondary | ICD-10-CM | POA: Diagnosis not present

## 2020-03-03 DIAGNOSIS — Z9981 Dependence on supplemental oxygen: Secondary | ICD-10-CM | POA: Diagnosis not present

## 2020-03-03 SURGERY — COLONOSCOPY WITH PROPOFOL
Anesthesia: Monitor Anesthesia Care

## 2020-03-07 DIAGNOSIS — M542 Cervicalgia: Secondary | ICD-10-CM | POA: Diagnosis not present

## 2020-03-07 DIAGNOSIS — H9202 Otalgia, left ear: Secondary | ICD-10-CM | POA: Diagnosis not present

## 2020-03-07 DIAGNOSIS — I130 Hypertensive heart and chronic kidney disease with heart failure and stage 1 through stage 4 chronic kidney disease, or unspecified chronic kidney disease: Secondary | ICD-10-CM | POA: Diagnosis not present

## 2020-03-07 DIAGNOSIS — K219 Gastro-esophageal reflux disease without esophagitis: Secondary | ICD-10-CM | POA: Diagnosis not present

## 2020-03-07 DIAGNOSIS — E039 Hypothyroidism, unspecified: Secondary | ICD-10-CM | POA: Diagnosis not present

## 2020-03-07 DIAGNOSIS — I5033 Acute on chronic diastolic (congestive) heart failure: Secondary | ICD-10-CM | POA: Diagnosis not present

## 2020-03-07 DIAGNOSIS — J9601 Acute respiratory failure with hypoxia: Secondary | ICD-10-CM | POA: Diagnosis not present

## 2020-03-07 DIAGNOSIS — D631 Anemia in chronic kidney disease: Secondary | ICD-10-CM | POA: Diagnosis not present

## 2020-03-07 DIAGNOSIS — I482 Chronic atrial fibrillation, unspecified: Secondary | ICD-10-CM | POA: Diagnosis not present

## 2020-03-07 DIAGNOSIS — Z9981 Dependence on supplemental oxygen: Secondary | ICD-10-CM | POA: Diagnosis not present

## 2020-03-07 DIAGNOSIS — R42 Dizziness and giddiness: Secondary | ICD-10-CM | POA: Diagnosis not present

## 2020-03-07 DIAGNOSIS — N1832 Chronic kidney disease, stage 3b: Secondary | ICD-10-CM | POA: Diagnosis not present

## 2020-03-07 DIAGNOSIS — R0981 Nasal congestion: Secondary | ICD-10-CM | POA: Diagnosis not present

## 2020-03-07 DIAGNOSIS — Z9181 History of falling: Secondary | ICD-10-CM | POA: Diagnosis not present

## 2020-03-15 DIAGNOSIS — Z9981 Dependence on supplemental oxygen: Secondary | ICD-10-CM | POA: Diagnosis not present

## 2020-03-15 DIAGNOSIS — R42 Dizziness and giddiness: Secondary | ICD-10-CM | POA: Diagnosis not present

## 2020-03-15 DIAGNOSIS — I5033 Acute on chronic diastolic (congestive) heart failure: Secondary | ICD-10-CM | POA: Diagnosis not present

## 2020-03-15 DIAGNOSIS — E039 Hypothyroidism, unspecified: Secondary | ICD-10-CM | POA: Diagnosis not present

## 2020-03-15 DIAGNOSIS — K219 Gastro-esophageal reflux disease without esophagitis: Secondary | ICD-10-CM | POA: Diagnosis not present

## 2020-03-15 DIAGNOSIS — I482 Chronic atrial fibrillation, unspecified: Secondary | ICD-10-CM | POA: Diagnosis not present

## 2020-03-15 DIAGNOSIS — D631 Anemia in chronic kidney disease: Secondary | ICD-10-CM | POA: Diagnosis not present

## 2020-03-15 DIAGNOSIS — Z9181 History of falling: Secondary | ICD-10-CM | POA: Diagnosis not present

## 2020-03-15 DIAGNOSIS — J9601 Acute respiratory failure with hypoxia: Secondary | ICD-10-CM | POA: Diagnosis not present

## 2020-03-15 DIAGNOSIS — N1832 Chronic kidney disease, stage 3b: Secondary | ICD-10-CM | POA: Diagnosis not present

## 2020-03-15 DIAGNOSIS — I130 Hypertensive heart and chronic kidney disease with heart failure and stage 1 through stage 4 chronic kidney disease, or unspecified chronic kidney disease: Secondary | ICD-10-CM | POA: Diagnosis not present

## 2020-03-20 DIAGNOSIS — K219 Gastro-esophageal reflux disease without esophagitis: Secondary | ICD-10-CM | POA: Diagnosis not present

## 2020-03-20 DIAGNOSIS — Z9981 Dependence on supplemental oxygen: Secondary | ICD-10-CM | POA: Diagnosis not present

## 2020-03-20 DIAGNOSIS — J9601 Acute respiratory failure with hypoxia: Secondary | ICD-10-CM | POA: Diagnosis not present

## 2020-03-20 DIAGNOSIS — I482 Chronic atrial fibrillation, unspecified: Secondary | ICD-10-CM | POA: Diagnosis not present

## 2020-03-20 DIAGNOSIS — R42 Dizziness and giddiness: Secondary | ICD-10-CM | POA: Diagnosis not present

## 2020-03-20 DIAGNOSIS — N1832 Chronic kidney disease, stage 3b: Secondary | ICD-10-CM | POA: Diagnosis not present

## 2020-03-20 DIAGNOSIS — I5033 Acute on chronic diastolic (congestive) heart failure: Secondary | ICD-10-CM | POA: Diagnosis not present

## 2020-03-20 DIAGNOSIS — Z9181 History of falling: Secondary | ICD-10-CM | POA: Diagnosis not present

## 2020-03-20 DIAGNOSIS — I130 Hypertensive heart and chronic kidney disease with heart failure and stage 1 through stage 4 chronic kidney disease, or unspecified chronic kidney disease: Secondary | ICD-10-CM | POA: Diagnosis not present

## 2020-03-20 DIAGNOSIS — D631 Anemia in chronic kidney disease: Secondary | ICD-10-CM | POA: Diagnosis not present

## 2020-03-20 DIAGNOSIS — E039 Hypothyroidism, unspecified: Secondary | ICD-10-CM | POA: Diagnosis not present

## 2020-03-27 ENCOUNTER — Other Ambulatory Visit: Payer: Self-pay

## 2020-03-27 ENCOUNTER — Emergency Department (HOSPITAL_COMMUNITY)
Admission: EM | Admit: 2020-03-27 | Discharge: 2020-03-27 | Disposition: A | Discharge status: Home / Self Care | Payer: Medicare Other | Attending: Emergency Medicine | Admitting: Emergency Medicine

## 2020-03-27 ENCOUNTER — Emergency Department (HOSPITAL_COMMUNITY): Payer: Medicare Other

## 2020-03-27 ENCOUNTER — Encounter (HOSPITAL_COMMUNITY): Payer: Self-pay

## 2020-03-27 DIAGNOSIS — N1832 Chronic kidney disease, stage 3b: Secondary | ICD-10-CM | POA: Insufficient documentation

## 2020-03-27 DIAGNOSIS — Z85828 Personal history of other malignant neoplasm of skin: Secondary | ICD-10-CM | POA: Insufficient documentation

## 2020-03-27 DIAGNOSIS — E860 Dehydration: Secondary | ICD-10-CM | POA: Diagnosis not present

## 2020-03-27 DIAGNOSIS — R6889 Other general symptoms and signs: Secondary | ICD-10-CM | POA: Diagnosis not present

## 2020-03-27 DIAGNOSIS — N390 Urinary tract infection, site not specified: Secondary | ICD-10-CM | POA: Insufficient documentation

## 2020-03-27 DIAGNOSIS — Z79899 Other long term (current) drug therapy: Secondary | ICD-10-CM | POA: Insufficient documentation

## 2020-03-27 DIAGNOSIS — Z20822 Contact with and (suspected) exposure to covid-19: Secondary | ICD-10-CM | POA: Diagnosis not present

## 2020-03-27 DIAGNOSIS — I130 Hypertensive heart and chronic kidney disease with heart failure and stage 1 through stage 4 chronic kidney disease, or unspecified chronic kidney disease: Secondary | ICD-10-CM | POA: Diagnosis not present

## 2020-03-27 DIAGNOSIS — I5033 Acute on chronic diastolic (congestive) heart failure: Secondary | ICD-10-CM | POA: Insufficient documentation

## 2020-03-27 DIAGNOSIS — Z743 Need for continuous supervision: Secondary | ICD-10-CM | POA: Diagnosis not present

## 2020-03-27 DIAGNOSIS — I959 Hypotension, unspecified: Secondary | ICD-10-CM | POA: Diagnosis not present

## 2020-03-27 DIAGNOSIS — R531 Weakness: Secondary | ICD-10-CM | POA: Insufficient documentation

## 2020-03-27 DIAGNOSIS — E1122 Type 2 diabetes mellitus with diabetic chronic kidney disease: Secondary | ICD-10-CM | POA: Diagnosis not present

## 2020-03-27 DIAGNOSIS — I4891 Unspecified atrial fibrillation: Secondary | ICD-10-CM | POA: Diagnosis not present

## 2020-03-27 DIAGNOSIS — R109 Unspecified abdominal pain: Secondary | ICD-10-CM | POA: Diagnosis not present

## 2020-03-27 LAB — URINALYSIS, ROUTINE W REFLEX MICROSCOPIC
Bilirubin Urine: NEGATIVE
Glucose, UA: NEGATIVE mg/dL
Hgb urine dipstick: NEGATIVE
Ketones, ur: NEGATIVE mg/dL
Nitrite: POSITIVE — AB
Protein, ur: NEGATIVE mg/dL
Specific Gravity, Urine: 1.008 (ref 1.005–1.030)
pH: 5 (ref 5.0–8.0)

## 2020-03-27 LAB — COMPREHENSIVE METABOLIC PANEL
ALT: 33 U/L (ref 0–44)
AST: 22 U/L (ref 15–41)
Albumin: 4.2 g/dL (ref 3.5–5.0)
Alkaline Phosphatase: 81 U/L (ref 38–126)
Anion gap: 12 (ref 5–15)
BUN: 27 mg/dL — ABNORMAL HIGH (ref 8–23)
CO2: 22 mmol/L (ref 22–32)
Calcium: 9.3 mg/dL (ref 8.9–10.3)
Chloride: 103 mmol/L (ref 98–111)
Creatinine, Ser: 1.44 mg/dL — ABNORMAL HIGH (ref 0.44–1.00)
GFR, Estimated: 35 mL/min — ABNORMAL LOW (ref >60)
Glucose, Bld: 110 mg/dL — ABNORMAL HIGH (ref 70–99)
Potassium: 4.2 mmol/L (ref 3.5–5.1)
Sodium: 137 mmol/L (ref 135–145)
Total Bilirubin: 0.4 mg/dL (ref 0.3–1.2)
Total Protein: 8.1 g/dL (ref 6.5–8.1)

## 2020-03-27 LAB — CBC WITH DIFFERENTIAL/PLATELET
Abs Immature Granulocytes: 0.03 10*3/uL (ref 0.00–0.07)
Basophils Absolute: 0 10*3/uL (ref 0.0–0.1)
Basophils Relative: 0 %
Eosinophils Absolute: 0.1 10*3/uL (ref 0.0–0.5)
Eosinophils Relative: 1 %
HCT: 37.8 % (ref 36.0–46.0)
Hemoglobin: 11.9 g/dL — ABNORMAL LOW (ref 12.0–15.0)
Immature Granulocytes: 0 %
Lymphocytes Relative: 23 %
Lymphs Abs: 1.8 10*3/uL (ref 0.7–4.0)
MCH: 31.2 pg (ref 26.0–34.0)
MCHC: 31.5 g/dL (ref 30.0–36.0)
MCV: 99 fL (ref 80.0–100.0)
Monocytes Absolute: 0.7 10*3/uL (ref 0.1–1.0)
Monocytes Relative: 9 %
Neutro Abs: 5.2 10*3/uL (ref 1.7–7.7)
Neutrophils Relative %: 67 %
Platelets: 305 10*3/uL (ref 150–400)
RBC: 3.82 MIL/uL — ABNORMAL LOW (ref 3.87–5.11)
RDW: 13.7 % (ref 11.5–15.5)
WBC: 7.8 10*3/uL (ref 4.0–10.5)
nRBC: 0 % (ref 0.0–0.2)

## 2020-03-27 LAB — RESP PANEL BY RT-PCR (FLU A&B, COVID) ARPGX2
Influenza A by PCR: NEGATIVE
Influenza B by PCR: NEGATIVE
SARS Coronavirus 2 by RT PCR: NEGATIVE

## 2020-03-27 LAB — LACTIC ACID, PLASMA: Lactic Acid, Venous: 1.9 mmol/L (ref 0.5–1.9)

## 2020-03-27 LAB — LIPASE, BLOOD: Lipase: 24 U/L (ref 11–51)

## 2020-03-27 IMAGING — DX DG CHEST 1V PORT
1 series · 1 of 1 positions shown · non-contrast
Comparison: Chest x-ray [DATE], ct [REDACTED]

CLINICAL DATA: Weakness, abdominal pain, hypotension.

EXAM:
PORTABLE CHEST 1 VIEW

[chest ap]
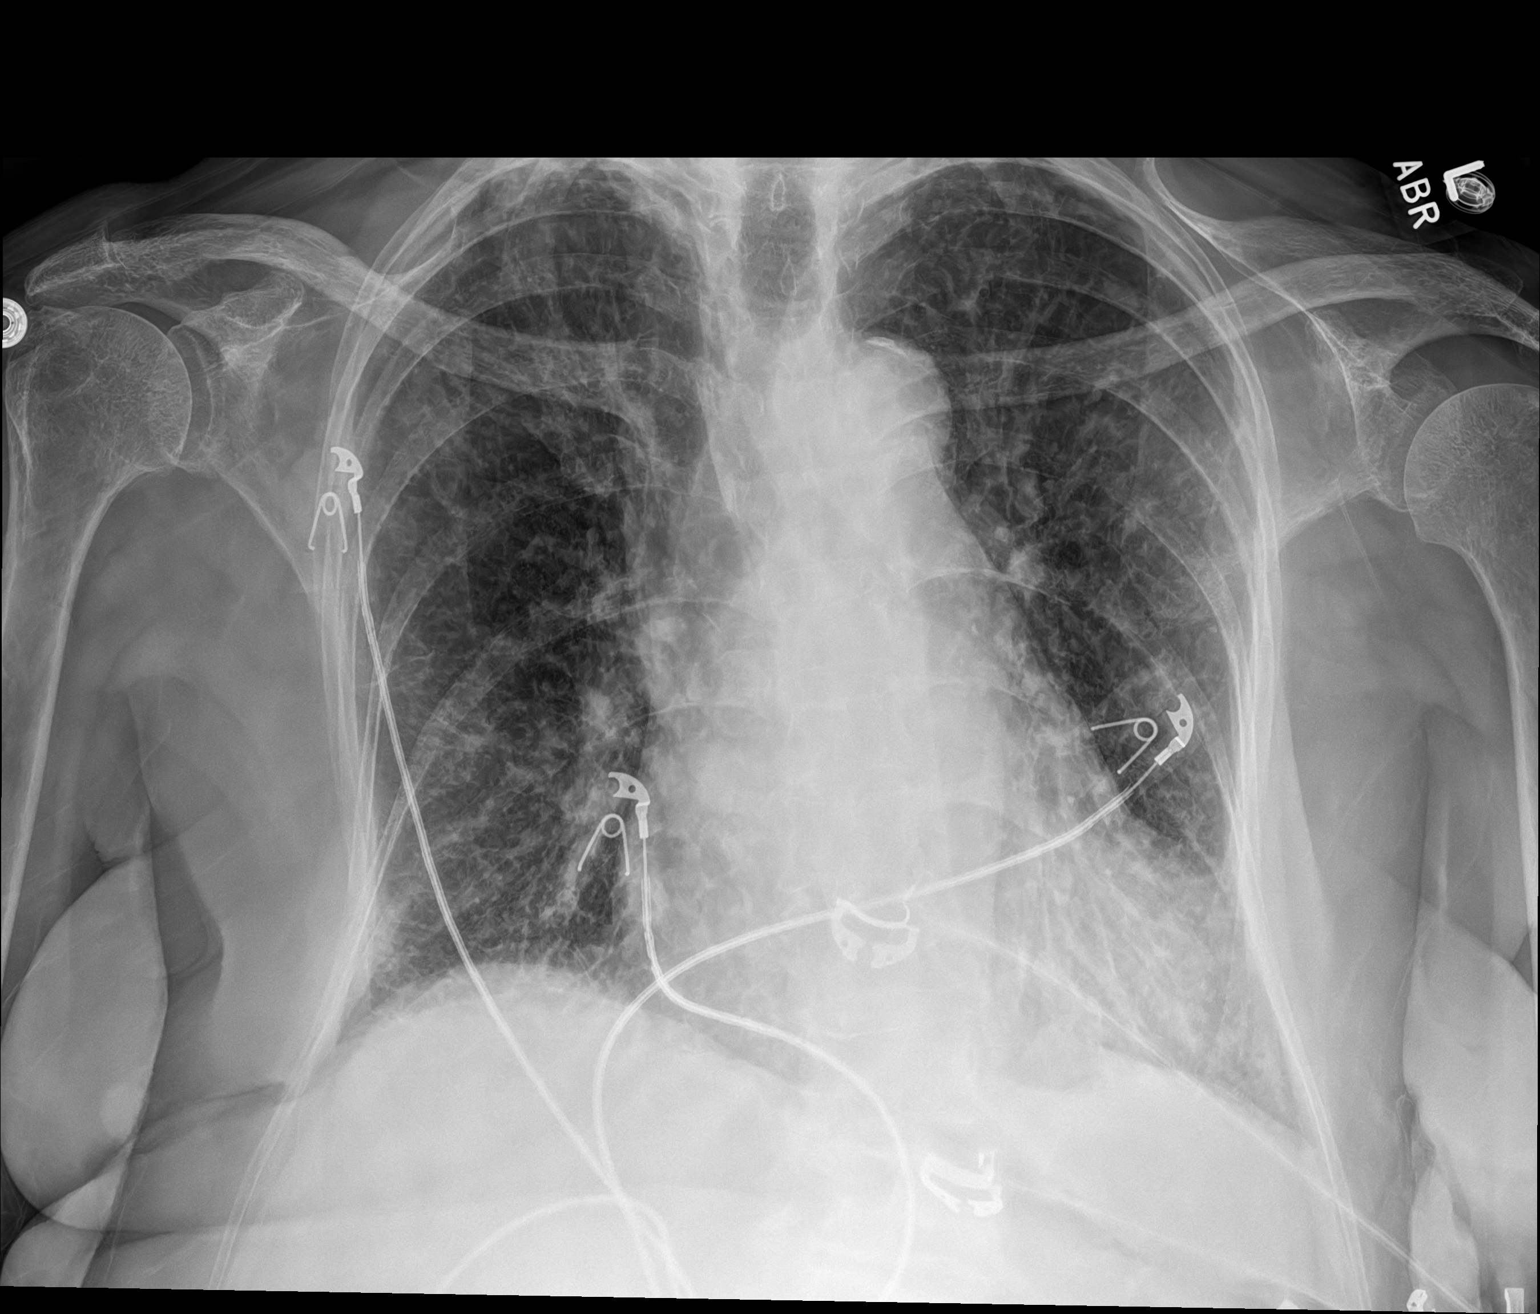

[1 of 1 positions shown; findings below may reference images not displayed]

FINDINGS: The heart size and mediastinal contours are within normal limits.
Aortic arch calcifications.

Biapical pleural/pulmonary scarring. Vague airspace opacities within
right lower lobe. Coarsened/increased interstitial markings with no
overt pulmonary edema. Suggestion of trace left pleural effusion is
again noted. No pneumothorax.

No acute osseous abnormality.
IMPRESSION: Vague airspace opacities within right lower lobe that could
represent infection/inflammation. Followup PA and lateral chest
X-ray is recommended in 3-4 weeks following therapy to ensure
resolution and exclude underlying malignancy.

## 2020-03-27 MED ORDER — SODIUM CHLORIDE 0.9 % IV SOLN
1.0000 g | Freq: Once | INTRAVENOUS | Status: AC
  Administered 2020-03-27: 1 g via INTRAVENOUS
  Filled 2020-03-27: qty 10

## 2020-03-27 MED ORDER — CEPHALEXIN 500 MG PO CAPS: 500.0000 mg | ORAL_CAPSULE | Freq: Three times a day (TID) | ORAL | 0 refills | Status: DC

## 2020-03-27 MED ORDER — SODIUM CHLORIDE 0.9 % IV BOLUS
1000.0000 mL | Freq: Once | INTRAVENOUS | Status: AC
  Administered 2020-03-27: 1000 mL via INTRAVENOUS

## 2020-03-27 MED ORDER — ONDANSETRON HCL 4 MG PO TABS: 4.0000 mg | ORAL_TABLET | Freq: Three times a day (TID) | ORAL | 0 refills | Status: DC | PRN

## 2020-03-27 NOTE — ED Notes (Signed)
D/c papers reviewed with family . Pt taken to car

## 2020-03-27 NOTE — Discharge Instructions (Addendum)
You were seen in the emergency department for feeling weak lightheaded and nauseous.  Your blood pressure was low on arrival but improved with some IV fluids.  You are likely dehydrated.  Your urine also shows signs of infection.  You received an IV dose of antibiotics and prescriptions for antibiotics and nausea medication.  Please schedule a follow-up appointment with your primary care doctor.  Drink plenty of fluids.  Return to the emergency department if any worsening or concerning symptoms

## 2020-03-27 NOTE — ED Provider Notes (Signed)
[facility] EMERGENCY DEPARTMENT Provider Note   CSN: [id number] Arrival date & time: 03/27/20  1809     History Chief Complaint  Patient presents with  . Abdominal Pain  . Weakness    Renee Blake is a 86 y.o. female.  She is a very poor historian.  She told her nurse she had some abdominal pain today and felt weak.  EMS found her to be hypotensive.  She blames it on taking her blood pressure medicine too close together.  She tells me she has had a pain in her head that goes into her neck and into her collarbone that is been there for months and that is why she is here today.  She does also state that she felt weak and nauseous although has not vomited.  No focal numbness or weakness.  Denies any chest pain.  Has chronic diarrhea.  No urinary symptoms.  The history is provided by the patient and the EMS personnel.  Weakness Severity:  Moderate Onset quality:  Sudden Timing:  Intermittent Progression:  Unchanged Chronicity:  Recurrent Relieved by:  None tried Worsened by:  Nothing Ineffective treatments:  None tried Associated symptoms: diarrhea (chronic), headaches and nausea   Associated symptoms: no abdominal pain, no chest pain, no dysuria, no fever, no loss of consciousness, no shortness of breath and no stroke symptoms        Past Medical History:  Diagnosis Date  . Acute blood loss anemia   . Atrial fibrillation ([place])    a. diagnosed in 12/2018. Rate-control pursued given not a candidate for anticoagulation  . Chronic diarrhea   . Dilation of biliary tract   . Duodenal stricture   . Esophageal stricture   . Gastric ulcer   . GI bleed   . Hyperlipemia   . Hypertension   . Microscopic colitis   . Nephrolithiasis   . Pancreatic duct dilated   . Schatzki's ring   . Skin cancer   . Sliding hiatal hernia   . Vertigo     Patient Active Problem List   Diagnosis Date Noted  . Acute pulmonary edema (HCC)   . Longstanding persistent atrial fibrillation ([place])    . Palliative care by specialist   . Encounter for hospice care discussion   . Protein-calorie malnutrition, severe 04/27/2019  . Acute on chronic diastolic (congestive) heart failure ([place]) 04/27/2019  . Normocytic anemia 04/26/2019  . Goals of care, counseling/discussion   . Palliative care encounter   . Hypotension 04/17/2019  . Debility 03/16/2019  . Shortness of breath 03/16/2019  . Cachexia ([place]) 03/16/2019  . Permanent atrial fibrillation ([place])   . Hypoxia 03/15/2019  . CHF exacerbation ([place]) 03/14/2019  . Bilateral pleural effusion 03/04/2019  . Chronic atrial fibrillation ([place]) 03/04/2019  . DNR (do not resuscitate) 03/04/2019  . Acute on chronic diastolic congestive heart failure ([place]) 03/02/2019  . Chronic diastolic heart failure ([place]) 03/02/2019  . Chronic renal failure, stage 3b ([place]) 03/02/2019  . Acute respiratory failure with hypoxia ([place]) 03/02/2019  . Acute exacerbation of CHF (congestive heart failure) ([place]) 01/27/2019  . Atrial fibrillation with RVR ([place]) 01/27/2019  . Iron deficiency anemia due to chronic blood loss---H/o Gi Bleed 01/27/2019  . Vaginal mass 07/29/2017  . Heme positive stool 07/25/2017  . Cellulitis 12/10/2016  . Facial cellulitis   . Erroneous encounter - disregard 03/17/2015  . AKI (acute kidney injury) ([place])   . Metabolic acidosis 0000000  . Acute renal failure syndrome ([place])   .  Renal failure 08/22/2014  . Hyperkalemia 08/22/2014  . UTI (urinary tract infection) 08/22/2014  . Acute renal failure ([place]) 08/22/2014  . Dyspnea 03/15/2014  . Type 2 diabetes mellitus ([place]) 06/08/2013  . Acute diastolic congestive heart failure ([place]) 06/07/2013  . Syncope 03/26/2013  . Rectal bleeding 03/26/2013  . Dehydration 03/26/2013  . Generalized weakness 03/26/2013  . Malnutrition of moderate degree ([place]) 03/01/2013  . Diarrhea 02/28/2013  . Loss of weight 02/28/2013  . H/o Prior duodenal ulcer with hemorrhage 02/27/2013  . H/o Prior Lower GI  bleed (anorectal Ca) 02/26/2013  . Acute blood loss anemia 02/26/2013  . UTI (lower urinary tract infection) 02/26/2013  . Hypertension 02/26/2013    Past Surgical History:  Procedure Laterality Date  . BIOPSY  07/28/2017   Procedure: BIOPSY;  Surgeon: Rogene Houston, MD;  Location: [facility] ENDO SUITE;  Service: Endoscopy;;  rectum  . COLONOSCOPY N/A 07/28/2017   Procedure: COLONOSCOPY;  Surgeon: Rogene Houston, MD;  Location: [facility] ENDO SU[facility];  Service: Endoscopy;  Laterality: N/A;  . ESOPHAGOGASTRODUODENOSCOPY N/A 02/27/2013   Procedure: ESOPHAGOGASTRODUODENOSCOPY (EGD);  Surgeon: Ladene Artist, MD;  Location: [facility] ENDOSCOPY;  Service: Endoscopy;  Laterality: N/A;  . ESOPHAGOGASTRODUODENOSCOPY N/A 03/28/2013   Procedure: ESOPHAGOGASTRODUODENOSCOPY (EGD);  Surgeon: Rogene Houston, MD;  Location: [facility] ENDO SU[facility];  Service: Endoscopy;  Laterality: N/A;  . HOT HEMOSTASIS N/A 02/27/2013   Procedure: HOT HEMOSTASIS (ARGON PLASMA COAGULATION/BICAP);  Surgeon: Ladene Artist, MD;  Location: [facility] ENDOSCOPY;  Service: Endoscopy;  Laterality: N/A;  . NOSE SURGERY     for skin cancer  . POLYPECTOMY  07/28/2017   Procedure: POLYPECTOMY;  Surgeon: Rogene Houston, MD;  Location: [facility] ENDO SUITE;  Service: Endoscopy;;  colon      OB History    Gravida  6   Para  6   Term  6   Preterm      AB      Living  6     SAB      IAB      Ectopic      Multiple      Live Births              Family History  Problem Relation Age of Onset  . Heart attack Father     Social History   Tobacco Use  . Smoking status: Never Smoker  . Smokeless tobacco: Never Used  Vaping Use  . Vaping Use: Never used  Substance Use Topics  . Alcohol use: No    Alcohol/week: 0.0 standard drinks  . Drug use: No    Home Medications Prior to Admission medications   Medication Sig Start Date End Date Taking? Authorizing Provider  acetaminophen (TYLENOL) 325 MG tablet Take 650 mg by mouth every 6 (six) hours  as needed for mild pain or moderate pain.    [provider]  ALPRAZolam Duanne Moron) 0.25 MG tablet Take 1 tablet (0.25 mg total) by mouth 2 (two) times daily as needed. for anxiety 04/20/19   Manuella Ghazi, Pratik D, DO  dicyclomine (BENTYL) 10 MG capsule Take 10 mg by mouth every 4 (four) hours as needed for spasms.    [provider]  diltiazem (CARDIZEM SR) 90 MG 12 hr capsule Take 1 capsule (90 mg total) by mouth 2 (two) times daily. 04/03/19   Kroeger, Lorelee Cover., PA-C  levothyroxine (SYNTHROID) 25 MCG tablet Take 25 mcg by mouth daily before breakfast.    [provider]  loperamide (IMODIUM  A-D) 2 MG tablet Take 2 mg by mouth daily as needed for diarrhea or loose stools.    [provider]  meclizine (ANTIVERT) 25 MG tablet Take 25 mg by mouth daily as needed for dizziness.  07/11/17   [provider]  metoprolol succinate (TOPROL-XL) 25 MG 24 hr tablet Take 1 tablet (25 mg total) by mouth daily. 10/28/19 01/28/20  Netta Neat., NP  Multiple Vitamin (MULTIVITAMIN WITH MINERALS) TABS tablet Take 1 tablet by mouth daily. 03/19/19   Catarina Hartshorn, MD  ondansetron (ZOFRAN) 4 MG tablet Take 4 mg by mouth every 8 (eight) hours as needed for nausea or vomiting.    [provider]  potassium chloride SA (KLOR-CON) 20 MEQ tablet Take 20 mEq by mouth daily.    [provider]  pregabalin (LYRICA) 25 MG capsule Take 1 capsule (25 mg total) by mouth 2 (two) times daily. 05/04/19   Catarina Hartshorn, MD  torsemide (DEMADEX) 20 MG tablet Take 40 mg in the AM & Take 20 mg in the PM. Patient taking differently: Take 20 mg by mouth 2 (two) times daily.  05/18/19   Strader, Lennart Pall, PA-C    Allergies    Clonazepam, Codeine, and Sulfa antibiotics  Review of Systems   Review of Systems  Constitutional: Negative for fever.  HENT: Negative for sore throat.   Eyes: Negative for visual disturbance.  Respiratory: Negative for shortness of breath.   Cardiovascular:  Negative for chest pain.  Gastrointestinal: Positive for diarrhea (chronic) and nausea. Negative for abdominal pain.  Genitourinary: Negative for dysuria.  Musculoskeletal: Positive for neck pain.  Skin: Negative for rash.  Neurological: Positive for weakness and headaches. Negative for loss of consciousness.    Physical Exam Updated Vital Signs BP (!) 81/58 (BP Location: Right Arm)   Pulse 60   Temp 97.7 F (36.5 C) (Oral)   Resp (!) 23   Ht 5\' 5"  (1.651 m)   Wt 53.5 kg   SpO2 99%   BMI 19.64 kg/m   Physical Exam Vitals and nursing note reviewed.  Constitutional:      General: She is not in acute distress.    Appearance: Normal appearance. She is well-developed and well-nourished.  HENT:     Head: Normocephalic and atraumatic.  Eyes:     Conjunctiva/sclera: Conjunctivae normal.  Cardiovascular:     Rate and Rhythm: Normal rate and regular rhythm.     Heart sounds: No murmur heard.   Pulmonary:     Effort: Pulmonary effort is normal. No respiratory distress.     Breath sounds: Normal breath sounds.  Abdominal:     Palpations: Abdomen is soft.     Tenderness: There is no abdominal tenderness. There is no guarding or rebound.  Musculoskeletal:        General: No deformity, signs of injury or edema. Normal range of motion.     Cervical back: Neck supple.  Skin:    General: Skin is warm and dry.  Neurological:     General: No focal deficit present.     Mental Status: She is alert.     Sensory: No sensory deficit.     Motor: No weakness.  Psychiatric:        Mood and Affect: Mood and affect normal.     ED Results / Procedures / Treatments   Labs (all labs ordered are listed, but only abnormal results are displayed) Labs Reviewed  COMPREHENSIVE METABOLIC PANEL - Abnormal; Notable for  the following components:      Result Value   Glucose, Bld 110 (*)    BUN 27 (*)    Creatinine, Ser 1.44 (*)    GFR, Estimated 35 (*)    All other components within normal  limits  CBC WITH DIFFERENTIAL/PLATELET - Abnormal; Notable for the following components:   RBC 3.82 (*)    Hemoglobin 11.9 (*)    All other components within normal limits  URINALYSIS, ROUTINE W REFLEX MICROSCOPIC - Abnormal; Notable for the following components:   APPearance HAZY (*)    Nitrite POSITIVE (*)    Leukocytes,Ua SMALL (*)    Bacteria, UA MANY (*)    All other components within normal limits  CULTURE, BLOOD (ROUTINE X 2)  CULTURE, BLOOD (ROUTINE X 2)  RESP PANEL BY RT-PCR (FLU A&B, COVID) [id number]  URINE CULTURE  LIPASE, BLOOD  LACTIC ACID, PLASMA    EKG EKG Interpretation  Date/Time:  Monday March 27 2020 18:32:00 EST Ventricular Rate:  61 PR Interval:    QRS Duration: 136 QT Interval:  480 QTC Calculation: 484 R Axis:   -42 Text Interpretation: Atrial fibrillation RBBB and LAFB No significant change since prior 7/21 Confirmed by Aletta Edouard [phone]) on 03/27/2020 6:45:30 PM   Radiology DG Chest Port 1 View  Result Date: 03/27/2020 CLINICAL DATA:  Weakness, abdominal pain, hypotension. EXAM: PORTABLE CHEST 1 VIEW COMPARISON:  Chest x-ray 10/18/2019, ct angio 03/04/2019 FINDINGS: The heart size and mediastinal contours are within normal limits. Aortic arch calcifications. Biapical pleural/pulmonary scarring. Vague airspace opacities within right lower lobe. Coarsened/increased interstitial markings with no overt pulmonary edema. Suggestion of trace left pleural effusion is again noted. No pneumothorax. No acute osseous abnormality. IMPRESSION: Vague airspace opacities within right lower lobe that could represent infection/inflammation. Followup PA and lateral chest X-ray is recommended in 3-4 weeks following therapy to ensure resolution and exclude underlying malignancy. Electronically Signed   By: Iven Finn M.D.   On: 03/27/2020 19:35    Procedures Procedures (including critical care time)  Medications Ordered in ED Medications  sodium chloride 0.9 %  bolus 1,000 mL (0 mLs Intravenous Stopped 03/27/20 2234)  cefTRIAXone (ROCEPHIN) 1 g in sodium chloride 0.9 % 100 mL IVPB (0 g Intravenous Stopped 03/27/20 2318)    ED Course  I have reviewed the triage vital signs and the nursing notes.  Pertinent labs & imaging results that were available during my care of the patient were reviewed by me and considered in my medical decision making (see chart for details).  Clinical Course as of 03/28/20 [id number]  [facility] Mar 27, 2020  2115 Assessed patient, she said she is feeling better.  No headache no abdominal pain.  She says she feels cold she is been getting some IV fluids gave her some more blankets.  Creatinine a little up and blood pressure is responding to IV fluids.  Covid testing negative.  Lactate not elevated [MB]    Clinical Course User Index [MB] Hayden Rasmussen, MD   MDM Rules/Calculators/A&P                         Renee Blake was evaluated in Emergency Department on 03/27/2020 for the symptoms described in the history of present illness. She was evaluated in the context of the global COVID-19 pandemic, which necessitated consideration that the patient might be at risk for infection with the SARS-CoV-2 virus that causes COVID-19. Institutional protocols and algorithms that pertain  to the evaluation of patients at risk for COVID-19 are in a state of rapid change based on information released by regulatory bodies including the CDC and federal and state organizations. These policies and algorithms were followed during the patient's care in the ED.  This patient complains of weakness nausea pain in her head and neck, possible pain in her abdomen; this involves an extensive number of treatment Options and is a complaint that carries with it a high risk of complications and Morbidity. The differential includes dehydration, sepsis, Sirs renal failure, UTI, pneumonia, Covid, tumor, bleed  I ordered, reviewed and interpreted labs, which included  CBC with normal white count, low but stable hemoglobin, chemistries with mild elevation in BUN and creatinine reflecting some dehydration, urinalysis with 7-20 white cells and nitrite positive reflecting infection, lactate not elevated, blood cultures pending at time of discharge, Covid testing negative I ordered medication IV fluids and IV antibiotics with improvement in her blood pressure I ordered imaging studies which included chest x-ray and I independently    visualized and interpreted imaging which showed no acute disease Additional history obtained from patient's daughter who does state that the patient does not drink enough fluids Previous records obtained and reviewed in epic, no recent admissions  After the interventions stated above, I reevaluated the patient and found patient symptoms to be improved.  Currently no headache or neck pain no abdominal pain.  Blood pressure improved.  Reviewed work-up with her and her daughter.  They are comfortable plan for discharge on antibiotics and nausea medication.  Commend close follow-up with PCP.  Return instructions discussed   Final Clinical Impression(s) / ED Diagnoses Final diagnoses:  Weakness  Urinary tract infection without hematuria, site unspecified  Dehydration    Rx / DC Orders ED Discharge Orders         Ordered    cephALEXin (KEFLEX) 500 MG capsule  3 times daily        03/27/20 2233    ondansetron (ZOFRAN) 4 MG tablet  Every 8 hours PRN        03/27/20 2234           Hayden Rasmussen, MD 03/28/20 [phone]

## 2020-03-27 NOTE — ED Notes (Signed)
Attempt to call family x3 using number. Phone went straight to voice mail

## 2020-03-27 NOTE — ED Triage Notes (Signed)
Pt to er, pt states that she is here for some abd pain and she is feeling weak, per ems pt took some of her medications close together and is hypotensive.  Pt oriented to person, place, doesn't know day of the week, but knows month and year.

## 2020-03-28 DIAGNOSIS — J9601 Acute respiratory failure with hypoxia: Secondary | ICD-10-CM | POA: Diagnosis not present

## 2020-03-28 DIAGNOSIS — J9621 Acute and chronic respiratory failure with hypoxia: Secondary | ICD-10-CM | POA: Diagnosis not present

## 2020-03-28 DIAGNOSIS — R42 Dizziness and giddiness: Secondary | ICD-10-CM | POA: Diagnosis not present

## 2020-03-28 DIAGNOSIS — E039 Hypothyroidism, unspecified: Secondary | ICD-10-CM | POA: Diagnosis not present

## 2020-03-28 DIAGNOSIS — D631 Anemia in chronic kidney disease: Secondary | ICD-10-CM | POA: Diagnosis not present

## 2020-03-28 DIAGNOSIS — N1832 Chronic kidney disease, stage 3b: Secondary | ICD-10-CM | POA: Diagnosis not present

## 2020-03-28 DIAGNOSIS — K219 Gastro-esophageal reflux disease without esophagitis: Secondary | ICD-10-CM | POA: Diagnosis not present

## 2020-03-28 DIAGNOSIS — I482 Chronic atrial fibrillation, unspecified: Secondary | ICD-10-CM | POA: Diagnosis not present

## 2020-03-28 DIAGNOSIS — I130 Hypertensive heart and chronic kidney disease with heart failure and stage 1 through stage 4 chronic kidney disease, or unspecified chronic kidney disease: Secondary | ICD-10-CM | POA: Diagnosis not present

## 2020-03-28 DIAGNOSIS — Z9981 Dependence on supplemental oxygen: Secondary | ICD-10-CM | POA: Diagnosis not present

## 2020-03-28 DIAGNOSIS — I5033 Acute on chronic diastolic (congestive) heart failure: Secondary | ICD-10-CM | POA: Diagnosis not present

## 2020-03-28 DIAGNOSIS — Z9181 History of falling: Secondary | ICD-10-CM | POA: Diagnosis not present

## 2020-03-30 DIAGNOSIS — N1832 Chronic kidney disease, stage 3b: Secondary | ICD-10-CM | POA: Diagnosis not present

## 2020-03-30 DIAGNOSIS — K219 Gastro-esophageal reflux disease without esophagitis: Secondary | ICD-10-CM | POA: Diagnosis not present

## 2020-03-30 DIAGNOSIS — I130 Hypertensive heart and chronic kidney disease with heart failure and stage 1 through stage 4 chronic kidney disease, or unspecified chronic kidney disease: Secondary | ICD-10-CM | POA: Diagnosis not present

## 2020-03-30 DIAGNOSIS — I482 Chronic atrial fibrillation, unspecified: Secondary | ICD-10-CM | POA: Diagnosis not present

## 2020-03-30 DIAGNOSIS — Z9181 History of falling: Secondary | ICD-10-CM | POA: Diagnosis not present

## 2020-03-30 DIAGNOSIS — D631 Anemia in chronic kidney disease: Secondary | ICD-10-CM | POA: Diagnosis not present

## 2020-03-30 DIAGNOSIS — R42 Dizziness and giddiness: Secondary | ICD-10-CM | POA: Diagnosis not present

## 2020-03-30 DIAGNOSIS — Z9981 Dependence on supplemental oxygen: Secondary | ICD-10-CM | POA: Diagnosis not present

## 2020-03-30 DIAGNOSIS — J9601 Acute respiratory failure with hypoxia: Secondary | ICD-10-CM | POA: Diagnosis not present

## 2020-03-30 DIAGNOSIS — I5033 Acute on chronic diastolic (congestive) heart failure: Secondary | ICD-10-CM | POA: Diagnosis not present

## 2020-03-30 DIAGNOSIS — E039 Hypothyroidism, unspecified: Secondary | ICD-10-CM | POA: Diagnosis not present

## 2020-03-30 LAB — URINE CULTURE: Culture: >=100000 — AB

## 2020-03-31 ENCOUNTER — Telehealth: Payer: Self-pay

## 2020-03-31 NOTE — Telephone Encounter (Signed)
Post ED Visit - Positive Culture Follow-up  Culture report reviewed by antimicrobial stewardship pharmacist: [facility] []  , Pharm.D. []  Enzo Bi, Pharm.D., BCPS AQ-ID []  , Pharm.D., BCPS []  Celedonio Miyamoto, Pharm.D., BCPS []  [place], Garvin Fila.D., BCPS, AAHIVP []  , Pharm.D., BCPS, AAHIVP []  Georgina Pillion, PharmD, BCPS []  , PharmD, BCPS []  [place], PharmD, BCPS []  [address], PharmD [x]  , PharmD, BCPS []  Estella Husk, PharmD  Pharmacy Team []  Lysle Pearl, PharmD []  , PharmD []  Phillips Climes, PharmD []  , Rph []  Agapito Games) , PharmD []  Verlan Friends, PharmD []  , PharmD []  Mervyn Gay, PharmD []  , PharmD []  Vinnie Level, PharmD []  [facility], PharmD []  , PharmD []  Len Childs, PharmD   Positive urine culture Treated with Cephalexin, organism sensitive to the same and no further patient follow-up is required at this time.  03/31/2020, 10:50 AM

## 2020-04-01 LAB — CULTURE, BLOOD (ROUTINE X 2)
Culture: NO GROWTH
Culture: NO GROWTH
Special Requests: ADEQUATE
Special Requests: ADEQUATE

## 2020-04-02 DIAGNOSIS — N1832 Chronic kidney disease, stage 3b: Secondary | ICD-10-CM | POA: Diagnosis not present

## 2020-04-02 DIAGNOSIS — I130 Hypertensive heart and chronic kidney disease with heart failure and stage 1 through stage 4 chronic kidney disease, or unspecified chronic kidney disease: Secondary | ICD-10-CM | POA: Diagnosis not present

## 2020-04-02 DIAGNOSIS — N390 Urinary tract infection, site not specified: Secondary | ICD-10-CM | POA: Diagnosis not present

## 2020-04-02 DIAGNOSIS — D631 Anemia in chronic kidney disease: Secondary | ICD-10-CM | POA: Diagnosis not present

## 2020-04-02 DIAGNOSIS — I482 Chronic atrial fibrillation, unspecified: Secondary | ICD-10-CM | POA: Diagnosis not present

## 2020-04-02 DIAGNOSIS — E039 Hypothyroidism, unspecified: Secondary | ICD-10-CM | POA: Diagnosis not present

## 2020-04-02 DIAGNOSIS — J9601 Acute respiratory failure with hypoxia: Secondary | ICD-10-CM | POA: Diagnosis not present

## 2020-04-02 DIAGNOSIS — I5033 Acute on chronic diastolic (congestive) heart failure: Secondary | ICD-10-CM | POA: Diagnosis not present

## 2020-04-02 DIAGNOSIS — R42 Dizziness and giddiness: Secondary | ICD-10-CM | POA: Diagnosis not present

## 2020-04-03 DIAGNOSIS — R42 Dizziness and giddiness: Secondary | ICD-10-CM | POA: Diagnosis not present

## 2020-04-03 DIAGNOSIS — I5033 Acute on chronic diastolic (congestive) heart failure: Secondary | ICD-10-CM | POA: Diagnosis not present

## 2020-04-03 DIAGNOSIS — N1832 Chronic kidney disease, stage 3b: Secondary | ICD-10-CM | POA: Diagnosis not present

## 2020-04-03 DIAGNOSIS — J9601 Acute respiratory failure with hypoxia: Secondary | ICD-10-CM | POA: Diagnosis not present

## 2020-04-03 DIAGNOSIS — Z9181 History of falling: Secondary | ICD-10-CM | POA: Diagnosis not present

## 2020-04-03 DIAGNOSIS — D631 Anemia in chronic kidney disease: Secondary | ICD-10-CM | POA: Diagnosis not present

## 2020-04-03 DIAGNOSIS — K219 Gastro-esophageal reflux disease without esophagitis: Secondary | ICD-10-CM | POA: Diagnosis not present

## 2020-04-03 DIAGNOSIS — N390 Urinary tract infection, site not specified: Secondary | ICD-10-CM | POA: Diagnosis not present

## 2020-04-03 DIAGNOSIS — E039 Hypothyroidism, unspecified: Secondary | ICD-10-CM | POA: Diagnosis not present

## 2020-04-03 DIAGNOSIS — I482 Chronic atrial fibrillation, unspecified: Secondary | ICD-10-CM | POA: Diagnosis not present

## 2020-04-03 DIAGNOSIS — I130 Hypertensive heart and chronic kidney disease with heart failure and stage 1 through stage 4 chronic kidney disease, or unspecified chronic kidney disease: Secondary | ICD-10-CM | POA: Diagnosis not present

## 2020-04-06 DIAGNOSIS — N1832 Chronic kidney disease, stage 3b: Secondary | ICD-10-CM | POA: Diagnosis not present

## 2020-04-06 DIAGNOSIS — I130 Hypertensive heart and chronic kidney disease with heart failure and stage 1 through stage 4 chronic kidney disease, or unspecified chronic kidney disease: Secondary | ICD-10-CM | POA: Diagnosis not present

## 2020-04-06 DIAGNOSIS — J9601 Acute respiratory failure with hypoxia: Secondary | ICD-10-CM | POA: Diagnosis not present

## 2020-04-06 DIAGNOSIS — Z9181 History of falling: Secondary | ICD-10-CM | POA: Diagnosis not present

## 2020-04-06 DIAGNOSIS — R42 Dizziness and giddiness: Secondary | ICD-10-CM | POA: Diagnosis not present

## 2020-04-06 DIAGNOSIS — I5033 Acute on chronic diastolic (congestive) heart failure: Secondary | ICD-10-CM | POA: Diagnosis not present

## 2020-04-06 DIAGNOSIS — D631 Anemia in chronic kidney disease: Secondary | ICD-10-CM | POA: Diagnosis not present

## 2020-04-06 DIAGNOSIS — N390 Urinary tract infection, site not specified: Secondary | ICD-10-CM | POA: Diagnosis not present

## 2020-04-06 DIAGNOSIS — E039 Hypothyroidism, unspecified: Secondary | ICD-10-CM | POA: Diagnosis not present

## 2020-04-06 DIAGNOSIS — I482 Chronic atrial fibrillation, unspecified: Secondary | ICD-10-CM | POA: Diagnosis not present

## 2020-04-06 DIAGNOSIS — K219 Gastro-esophageal reflux disease without esophagitis: Secondary | ICD-10-CM | POA: Diagnosis not present

## 2020-04-07 DIAGNOSIS — K219 Gastro-esophageal reflux disease without esophagitis: Secondary | ICD-10-CM | POA: Diagnosis not present

## 2020-04-07 DIAGNOSIS — I130 Hypertensive heart and chronic kidney disease with heart failure and stage 1 through stage 4 chronic kidney disease, or unspecified chronic kidney disease: Secondary | ICD-10-CM | POA: Diagnosis not present

## 2020-04-07 DIAGNOSIS — E039 Hypothyroidism, unspecified: Secondary | ICD-10-CM | POA: Diagnosis not present

## 2020-04-07 DIAGNOSIS — R42 Dizziness and giddiness: Secondary | ICD-10-CM | POA: Diagnosis not present

## 2020-04-07 DIAGNOSIS — N390 Urinary tract infection, site not specified: Secondary | ICD-10-CM | POA: Diagnosis not present

## 2020-04-07 DIAGNOSIS — J9601 Acute respiratory failure with hypoxia: Secondary | ICD-10-CM | POA: Diagnosis not present

## 2020-04-07 DIAGNOSIS — I5033 Acute on chronic diastolic (congestive) heart failure: Secondary | ICD-10-CM | POA: Diagnosis not present

## 2020-04-07 DIAGNOSIS — Z9181 History of falling: Secondary | ICD-10-CM | POA: Diagnosis not present

## 2020-04-07 DIAGNOSIS — I482 Chronic atrial fibrillation, unspecified: Secondary | ICD-10-CM | POA: Diagnosis not present

## 2020-04-07 DIAGNOSIS — N1832 Chronic kidney disease, stage 3b: Secondary | ICD-10-CM | POA: Diagnosis not present

## 2020-04-07 DIAGNOSIS — D631 Anemia in chronic kidney disease: Secondary | ICD-10-CM | POA: Diagnosis not present

## 2020-04-10 DIAGNOSIS — N1832 Chronic kidney disease, stage 3b: Secondary | ICD-10-CM | POA: Diagnosis not present

## 2020-04-10 DIAGNOSIS — H9202 Otalgia, left ear: Secondary | ICD-10-CM | POA: Diagnosis not present

## 2020-04-10 DIAGNOSIS — J9621 Acute and chronic respiratory failure with hypoxia: Secondary | ICD-10-CM | POA: Diagnosis not present

## 2020-04-10 DIAGNOSIS — I10 Essential (primary) hypertension: Secondary | ICD-10-CM | POA: Diagnosis not present

## 2020-04-10 DIAGNOSIS — R0981 Nasal congestion: Secondary | ICD-10-CM | POA: Diagnosis not present

## 2020-04-10 DIAGNOSIS — K219 Gastro-esophageal reflux disease without esophagitis: Secondary | ICD-10-CM | POA: Diagnosis not present

## 2020-04-12 DIAGNOSIS — N1832 Chronic kidney disease, stage 3b: Secondary | ICD-10-CM | POA: Diagnosis not present

## 2020-04-12 DIAGNOSIS — K219 Gastro-esophageal reflux disease without esophagitis: Secondary | ICD-10-CM | POA: Diagnosis not present

## 2020-04-12 DIAGNOSIS — I482 Chronic atrial fibrillation, unspecified: Secondary | ICD-10-CM | POA: Diagnosis not present

## 2020-04-12 DIAGNOSIS — J9601 Acute respiratory failure with hypoxia: Secondary | ICD-10-CM | POA: Diagnosis not present

## 2020-04-12 DIAGNOSIS — K625 Hemorrhage of anus and rectum: Secondary | ICD-10-CM | POA: Diagnosis not present

## 2020-04-12 DIAGNOSIS — C20 Malignant neoplasm of rectum: Secondary | ICD-10-CM | POA: Diagnosis not present

## 2020-04-12 DIAGNOSIS — I129 Hypertensive chronic kidney disease with stage 1 through stage 4 chronic kidney disease, or unspecified chronic kidney disease: Secondary | ICD-10-CM | POA: Diagnosis not present

## 2020-04-12 DIAGNOSIS — I130 Hypertensive heart and chronic kidney disease with heart failure and stage 1 through stage 4 chronic kidney disease, or unspecified chronic kidney disease: Secondary | ICD-10-CM | POA: Diagnosis not present

## 2020-04-12 DIAGNOSIS — I5032 Chronic diastolic (congestive) heart failure: Secondary | ICD-10-CM | POA: Diagnosis not present

## 2020-04-12 DIAGNOSIS — E039 Hypothyroidism, unspecified: Secondary | ICD-10-CM | POA: Diagnosis not present

## 2020-04-12 DIAGNOSIS — R531 Weakness: Secondary | ICD-10-CM | POA: Diagnosis not present

## 2020-04-12 DIAGNOSIS — Z9181 History of falling: Secondary | ICD-10-CM | POA: Diagnosis not present

## 2020-04-12 DIAGNOSIS — I5033 Acute on chronic diastolic (congestive) heart failure: Secondary | ICD-10-CM | POA: Diagnosis not present

## 2020-04-12 DIAGNOSIS — D649 Anemia, unspecified: Secondary | ICD-10-CM | POA: Diagnosis not present

## 2020-04-12 DIAGNOSIS — N390 Urinary tract infection, site not specified: Secondary | ICD-10-CM | POA: Diagnosis not present

## 2020-04-12 DIAGNOSIS — R42 Dizziness and giddiness: Secondary | ICD-10-CM | POA: Diagnosis not present

## 2020-04-12 DIAGNOSIS — D631 Anemia in chronic kidney disease: Secondary | ICD-10-CM | POA: Diagnosis not present

## 2020-04-17 DIAGNOSIS — E039 Hypothyroidism, unspecified: Secondary | ICD-10-CM | POA: Diagnosis not present

## 2020-04-17 DIAGNOSIS — I5033 Acute on chronic diastolic (congestive) heart failure: Secondary | ICD-10-CM | POA: Diagnosis not present

## 2020-04-17 DIAGNOSIS — I482 Chronic atrial fibrillation, unspecified: Secondary | ICD-10-CM | POA: Diagnosis not present

## 2020-04-17 DIAGNOSIS — D631 Anemia in chronic kidney disease: Secondary | ICD-10-CM | POA: Diagnosis not present

## 2020-04-17 DIAGNOSIS — N1832 Chronic kidney disease, stage 3b: Secondary | ICD-10-CM | POA: Diagnosis not present

## 2020-04-17 DIAGNOSIS — R42 Dizziness and giddiness: Secondary | ICD-10-CM | POA: Diagnosis not present

## 2020-04-17 DIAGNOSIS — I130 Hypertensive heart and chronic kidney disease with heart failure and stage 1 through stage 4 chronic kidney disease, or unspecified chronic kidney disease: Secondary | ICD-10-CM | POA: Diagnosis not present

## 2020-04-17 DIAGNOSIS — K219 Gastro-esophageal reflux disease without esophagitis: Secondary | ICD-10-CM | POA: Diagnosis not present

## 2020-04-17 DIAGNOSIS — Z9181 History of falling: Secondary | ICD-10-CM | POA: Diagnosis not present

## 2020-04-17 DIAGNOSIS — J9601 Acute respiratory failure with hypoxia: Secondary | ICD-10-CM | POA: Diagnosis not present

## 2020-04-17 DIAGNOSIS — N390 Urinary tract infection, site not specified: Secondary | ICD-10-CM | POA: Diagnosis not present

## 2020-04-19 DIAGNOSIS — I482 Chronic atrial fibrillation, unspecified: Secondary | ICD-10-CM | POA: Diagnosis not present

## 2020-04-19 DIAGNOSIS — N390 Urinary tract infection, site not specified: Secondary | ICD-10-CM | POA: Diagnosis not present

## 2020-04-19 DIAGNOSIS — N1832 Chronic kidney disease, stage 3b: Secondary | ICD-10-CM | POA: Diagnosis not present

## 2020-04-19 DIAGNOSIS — K219 Gastro-esophageal reflux disease without esophagitis: Secondary | ICD-10-CM | POA: Diagnosis not present

## 2020-04-19 DIAGNOSIS — E039 Hypothyroidism, unspecified: Secondary | ICD-10-CM | POA: Diagnosis not present

## 2020-04-19 DIAGNOSIS — J9601 Acute respiratory failure with hypoxia: Secondary | ICD-10-CM | POA: Diagnosis not present

## 2020-04-19 DIAGNOSIS — D631 Anemia in chronic kidney disease: Secondary | ICD-10-CM | POA: Diagnosis not present

## 2020-04-19 DIAGNOSIS — R42 Dizziness and giddiness: Secondary | ICD-10-CM | POA: Diagnosis not present

## 2020-04-19 DIAGNOSIS — I130 Hypertensive heart and chronic kidney disease with heart failure and stage 1 through stage 4 chronic kidney disease, or unspecified chronic kidney disease: Secondary | ICD-10-CM | POA: Diagnosis not present

## 2020-04-19 DIAGNOSIS — I5033 Acute on chronic diastolic (congestive) heart failure: Secondary | ICD-10-CM | POA: Diagnosis not present

## 2020-04-19 DIAGNOSIS — Z9181 History of falling: Secondary | ICD-10-CM | POA: Diagnosis not present

## 2020-04-20 DIAGNOSIS — Z9181 History of falling: Secondary | ICD-10-CM | POA: Diagnosis not present

## 2020-04-20 DIAGNOSIS — J9601 Acute respiratory failure with hypoxia: Secondary | ICD-10-CM | POA: Diagnosis not present

## 2020-04-20 DIAGNOSIS — R42 Dizziness and giddiness: Secondary | ICD-10-CM | POA: Diagnosis not present

## 2020-04-20 DIAGNOSIS — N390 Urinary tract infection, site not specified: Secondary | ICD-10-CM | POA: Diagnosis not present

## 2020-04-20 DIAGNOSIS — I5033 Acute on chronic diastolic (congestive) heart failure: Secondary | ICD-10-CM | POA: Diagnosis not present

## 2020-04-20 DIAGNOSIS — E039 Hypothyroidism, unspecified: Secondary | ICD-10-CM | POA: Diagnosis not present

## 2020-04-20 DIAGNOSIS — I130 Hypertensive heart and chronic kidney disease with heart failure and stage 1 through stage 4 chronic kidney disease, or unspecified chronic kidney disease: Secondary | ICD-10-CM | POA: Diagnosis not present

## 2020-04-20 DIAGNOSIS — N1832 Chronic kidney disease, stage 3b: Secondary | ICD-10-CM | POA: Diagnosis not present

## 2020-04-20 DIAGNOSIS — I482 Chronic atrial fibrillation, unspecified: Secondary | ICD-10-CM | POA: Diagnosis not present

## 2020-04-20 DIAGNOSIS — K219 Gastro-esophageal reflux disease without esophagitis: Secondary | ICD-10-CM | POA: Diagnosis not present

## 2020-04-20 DIAGNOSIS — D631 Anemia in chronic kidney disease: Secondary | ICD-10-CM | POA: Diagnosis not present

## 2020-04-24 DIAGNOSIS — N1832 Chronic kidney disease, stage 3b: Secondary | ICD-10-CM | POA: Diagnosis not present

## 2020-04-24 DIAGNOSIS — N390 Urinary tract infection, site not specified: Secondary | ICD-10-CM | POA: Diagnosis not present

## 2020-04-24 DIAGNOSIS — D631 Anemia in chronic kidney disease: Secondary | ICD-10-CM | POA: Diagnosis not present

## 2020-04-24 DIAGNOSIS — E039 Hypothyroidism, unspecified: Secondary | ICD-10-CM | POA: Diagnosis not present

## 2020-04-24 DIAGNOSIS — I130 Hypertensive heart and chronic kidney disease with heart failure and stage 1 through stage 4 chronic kidney disease, or unspecified chronic kidney disease: Secondary | ICD-10-CM | POA: Diagnosis not present

## 2020-04-24 DIAGNOSIS — I482 Chronic atrial fibrillation, unspecified: Secondary | ICD-10-CM | POA: Diagnosis not present

## 2020-04-24 DIAGNOSIS — J9601 Acute respiratory failure with hypoxia: Secondary | ICD-10-CM | POA: Diagnosis not present

## 2020-04-24 DIAGNOSIS — Z9181 History of falling: Secondary | ICD-10-CM | POA: Diagnosis not present

## 2020-04-24 DIAGNOSIS — I5033 Acute on chronic diastolic (congestive) heart failure: Secondary | ICD-10-CM | POA: Diagnosis not present

## 2020-04-24 DIAGNOSIS — K219 Gastro-esophageal reflux disease without esophagitis: Secondary | ICD-10-CM | POA: Diagnosis not present

## 2020-04-24 DIAGNOSIS — R42 Dizziness and giddiness: Secondary | ICD-10-CM | POA: Diagnosis not present

## 2020-04-25 DIAGNOSIS — J9601 Acute respiratory failure with hypoxia: Secondary | ICD-10-CM | POA: Diagnosis not present

## 2020-04-25 DIAGNOSIS — I130 Hypertensive heart and chronic kidney disease with heart failure and stage 1 through stage 4 chronic kidney disease, or unspecified chronic kidney disease: Secondary | ICD-10-CM | POA: Diagnosis not present

## 2020-04-25 DIAGNOSIS — I482 Chronic atrial fibrillation, unspecified: Secondary | ICD-10-CM | POA: Diagnosis not present

## 2020-04-25 DIAGNOSIS — K219 Gastro-esophageal reflux disease without esophagitis: Secondary | ICD-10-CM | POA: Diagnosis not present

## 2020-04-25 DIAGNOSIS — Z9181 History of falling: Secondary | ICD-10-CM | POA: Diagnosis not present

## 2020-04-25 DIAGNOSIS — E039 Hypothyroidism, unspecified: Secondary | ICD-10-CM | POA: Diagnosis not present

## 2020-04-25 DIAGNOSIS — R42 Dizziness and giddiness: Secondary | ICD-10-CM | POA: Diagnosis not present

## 2020-04-25 DIAGNOSIS — N390 Urinary tract infection, site not specified: Secondary | ICD-10-CM | POA: Diagnosis not present

## 2020-04-25 DIAGNOSIS — I5033 Acute on chronic diastolic (congestive) heart failure: Secondary | ICD-10-CM | POA: Diagnosis not present

## 2020-04-25 DIAGNOSIS — D631 Anemia in chronic kidney disease: Secondary | ICD-10-CM | POA: Diagnosis not present

## 2020-04-25 DIAGNOSIS — N1832 Chronic kidney disease, stage 3b: Secondary | ICD-10-CM | POA: Diagnosis not present

## 2020-04-26 DIAGNOSIS — N1832 Chronic kidney disease, stage 3b: Secondary | ICD-10-CM | POA: Diagnosis not present

## 2020-04-26 DIAGNOSIS — I5033 Acute on chronic diastolic (congestive) heart failure: Secondary | ICD-10-CM | POA: Diagnosis not present

## 2020-04-26 DIAGNOSIS — Z9181 History of falling: Secondary | ICD-10-CM | POA: Diagnosis not present

## 2020-04-26 DIAGNOSIS — I130 Hypertensive heart and chronic kidney disease with heart failure and stage 1 through stage 4 chronic kidney disease, or unspecified chronic kidney disease: Secondary | ICD-10-CM | POA: Diagnosis not present

## 2020-04-26 DIAGNOSIS — J9601 Acute respiratory failure with hypoxia: Secondary | ICD-10-CM | POA: Diagnosis not present

## 2020-04-26 DIAGNOSIS — I482 Chronic atrial fibrillation, unspecified: Secondary | ICD-10-CM | POA: Diagnosis not present

## 2020-04-26 DIAGNOSIS — N390 Urinary tract infection, site not specified: Secondary | ICD-10-CM | POA: Diagnosis not present

## 2020-04-26 DIAGNOSIS — D631 Anemia in chronic kidney disease: Secondary | ICD-10-CM | POA: Diagnosis not present

## 2020-04-26 DIAGNOSIS — R42 Dizziness and giddiness: Secondary | ICD-10-CM | POA: Diagnosis not present

## 2020-04-26 DIAGNOSIS — E039 Hypothyroidism, unspecified: Secondary | ICD-10-CM | POA: Diagnosis not present

## 2020-04-26 DIAGNOSIS — K219 Gastro-esophageal reflux disease without esophagitis: Secondary | ICD-10-CM | POA: Diagnosis not present

## 2020-04-28 DIAGNOSIS — J9621 Acute and chronic respiratory failure with hypoxia: Secondary | ICD-10-CM | POA: Diagnosis not present

## 2020-05-02 DIAGNOSIS — I5033 Acute on chronic diastolic (congestive) heart failure: Secondary | ICD-10-CM | POA: Diagnosis not present

## 2020-05-02 DIAGNOSIS — N390 Urinary tract infection, site not specified: Secondary | ICD-10-CM | POA: Diagnosis not present

## 2020-05-02 DIAGNOSIS — K219 Gastro-esophageal reflux disease without esophagitis: Secondary | ICD-10-CM | POA: Diagnosis not present

## 2020-05-02 DIAGNOSIS — N1832 Chronic kidney disease, stage 3b: Secondary | ICD-10-CM | POA: Diagnosis not present

## 2020-05-02 DIAGNOSIS — I130 Hypertensive heart and chronic kidney disease with heart failure and stage 1 through stage 4 chronic kidney disease, or unspecified chronic kidney disease: Secondary | ICD-10-CM | POA: Diagnosis not present

## 2020-05-02 DIAGNOSIS — Z9181 History of falling: Secondary | ICD-10-CM | POA: Diagnosis not present

## 2020-05-02 DIAGNOSIS — R42 Dizziness and giddiness: Secondary | ICD-10-CM | POA: Diagnosis not present

## 2020-05-02 DIAGNOSIS — D631 Anemia in chronic kidney disease: Secondary | ICD-10-CM | POA: Diagnosis not present

## 2020-05-02 DIAGNOSIS — E039 Hypothyroidism, unspecified: Secondary | ICD-10-CM | POA: Diagnosis not present

## 2020-05-02 DIAGNOSIS — I482 Chronic atrial fibrillation, unspecified: Secondary | ICD-10-CM | POA: Diagnosis not present

## 2020-05-02 DIAGNOSIS — J9601 Acute respiratory failure with hypoxia: Secondary | ICD-10-CM | POA: Diagnosis not present

## 2020-05-04 DIAGNOSIS — J9601 Acute respiratory failure with hypoxia: Secondary | ICD-10-CM | POA: Diagnosis not present

## 2020-05-04 DIAGNOSIS — I130 Hypertensive heart and chronic kidney disease with heart failure and stage 1 through stage 4 chronic kidney disease, or unspecified chronic kidney disease: Secondary | ICD-10-CM | POA: Diagnosis not present

## 2020-05-04 DIAGNOSIS — Z9181 History of falling: Secondary | ICD-10-CM | POA: Diagnosis not present

## 2020-05-04 DIAGNOSIS — N390 Urinary tract infection, site not specified: Secondary | ICD-10-CM | POA: Diagnosis not present

## 2020-05-04 DIAGNOSIS — I5033 Acute on chronic diastolic (congestive) heart failure: Secondary | ICD-10-CM | POA: Diagnosis not present

## 2020-05-04 DIAGNOSIS — D631 Anemia in chronic kidney disease: Secondary | ICD-10-CM | POA: Diagnosis not present

## 2020-05-04 DIAGNOSIS — I482 Chronic atrial fibrillation, unspecified: Secondary | ICD-10-CM | POA: Diagnosis not present

## 2020-05-04 DIAGNOSIS — N1832 Chronic kidney disease, stage 3b: Secondary | ICD-10-CM | POA: Diagnosis not present

## 2020-05-04 DIAGNOSIS — R42 Dizziness and giddiness: Secondary | ICD-10-CM | POA: Diagnosis not present

## 2020-05-04 DIAGNOSIS — K219 Gastro-esophageal reflux disease without esophagitis: Secondary | ICD-10-CM | POA: Diagnosis not present

## 2020-05-04 DIAGNOSIS — E039 Hypothyroidism, unspecified: Secondary | ICD-10-CM | POA: Diagnosis not present

## 2020-05-10 DIAGNOSIS — D631 Anemia in chronic kidney disease: Secondary | ICD-10-CM | POA: Diagnosis not present

## 2020-05-10 DIAGNOSIS — E039 Hypothyroidism, unspecified: Secondary | ICD-10-CM | POA: Diagnosis not present

## 2020-05-10 DIAGNOSIS — N390 Urinary tract infection, site not specified: Secondary | ICD-10-CM | POA: Diagnosis not present

## 2020-05-10 DIAGNOSIS — Z9181 History of falling: Secondary | ICD-10-CM | POA: Diagnosis not present

## 2020-05-10 DIAGNOSIS — I5033 Acute on chronic diastolic (congestive) heart failure: Secondary | ICD-10-CM | POA: Diagnosis not present

## 2020-05-10 DIAGNOSIS — J9601 Acute respiratory failure with hypoxia: Secondary | ICD-10-CM | POA: Diagnosis not present

## 2020-05-10 DIAGNOSIS — R42 Dizziness and giddiness: Secondary | ICD-10-CM | POA: Diagnosis not present

## 2020-05-10 DIAGNOSIS — I130 Hypertensive heart and chronic kidney disease with heart failure and stage 1 through stage 4 chronic kidney disease, or unspecified chronic kidney disease: Secondary | ICD-10-CM | POA: Diagnosis not present

## 2020-05-10 DIAGNOSIS — N1832 Chronic kidney disease, stage 3b: Secondary | ICD-10-CM | POA: Diagnosis not present

## 2020-05-10 DIAGNOSIS — I482 Chronic atrial fibrillation, unspecified: Secondary | ICD-10-CM | POA: Diagnosis not present

## 2020-05-10 DIAGNOSIS — K219 Gastro-esophageal reflux disease without esophagitis: Secondary | ICD-10-CM | POA: Diagnosis not present

## 2020-05-15 DIAGNOSIS — K219 Gastro-esophageal reflux disease without esophagitis: Secondary | ICD-10-CM | POA: Diagnosis not present

## 2020-05-15 DIAGNOSIS — N1832 Chronic kidney disease, stage 3b: Secondary | ICD-10-CM | POA: Diagnosis not present

## 2020-05-15 DIAGNOSIS — I130 Hypertensive heart and chronic kidney disease with heart failure and stage 1 through stage 4 chronic kidney disease, or unspecified chronic kidney disease: Secondary | ICD-10-CM | POA: Diagnosis not present

## 2020-05-15 DIAGNOSIS — Z9181 History of falling: Secondary | ICD-10-CM | POA: Diagnosis not present

## 2020-05-15 DIAGNOSIS — I5033 Acute on chronic diastolic (congestive) heart failure: Secondary | ICD-10-CM | POA: Diagnosis not present

## 2020-05-15 DIAGNOSIS — J9601 Acute respiratory failure with hypoxia: Secondary | ICD-10-CM | POA: Diagnosis not present

## 2020-05-15 DIAGNOSIS — I482 Chronic atrial fibrillation, unspecified: Secondary | ICD-10-CM | POA: Diagnosis not present

## 2020-05-15 DIAGNOSIS — D631 Anemia in chronic kidney disease: Secondary | ICD-10-CM | POA: Diagnosis not present

## 2020-05-15 DIAGNOSIS — R42 Dizziness and giddiness: Secondary | ICD-10-CM | POA: Diagnosis not present

## 2020-05-15 DIAGNOSIS — N390 Urinary tract infection, site not specified: Secondary | ICD-10-CM | POA: Diagnosis not present

## 2020-05-15 DIAGNOSIS — E039 Hypothyroidism, unspecified: Secondary | ICD-10-CM | POA: Diagnosis not present

## 2020-05-17 ENCOUNTER — Telehealth: Payer: Self-pay | Admitting: Family Medicine

## 2020-05-17 MED ORDER — METOPROLOL SUCCINATE ER 25 MG PO TB24: 25.0000 mg | ORAL_TABLET | Freq: Every day | ORAL | 1 refills | Status: AC

## 2020-05-17 MED ORDER — TORSEMIDE 20 MG PO TABS: ORAL_TABLET | ORAL | 1 refills | Status: DC

## 2020-05-17 MED ORDER — DILTIAZEM HCL ER 90 MG PO CP12: 90.0000 mg | ORAL_CAPSULE | Freq: Two times a day (BID) | ORAL | 1 refills | Status: DC

## 2020-05-17 MED ORDER — POTASSIUM CHLORIDE CRYS ER 20 MEQ PO TBCR: 20.0000 meq | EXTENDED_RELEASE_TABLET | Freq: Every day | ORAL | 1 refills | Status: DC

## 2020-05-17 NOTE — Telephone Encounter (Signed)
New message     *STAT* If patient is at the pharmacy, call can be transferred to refill team.   1. Which medications need to be refilled? (please list name of each medication and dose if known) all her cardiac medications - so that they can make her medpaks   2. Which pharmacy/location (including street and city if local pharmacy) is medication to be sent to? [facility] [place]   3. Do they need a 30 day or 90 day supply? [place]

## 2020-05-22 DIAGNOSIS — I482 Chronic atrial fibrillation, unspecified: Secondary | ICD-10-CM | POA: Diagnosis not present

## 2020-05-22 DIAGNOSIS — N390 Urinary tract infection, site not specified: Secondary | ICD-10-CM | POA: Diagnosis not present

## 2020-05-22 DIAGNOSIS — I5033 Acute on chronic diastolic (congestive) heart failure: Secondary | ICD-10-CM | POA: Diagnosis not present

## 2020-05-22 DIAGNOSIS — Z9181 History of falling: Secondary | ICD-10-CM | POA: Diagnosis not present

## 2020-05-22 DIAGNOSIS — D631 Anemia in chronic kidney disease: Secondary | ICD-10-CM | POA: Diagnosis not present

## 2020-05-22 DIAGNOSIS — E039 Hypothyroidism, unspecified: Secondary | ICD-10-CM | POA: Diagnosis not present

## 2020-05-22 DIAGNOSIS — I130 Hypertensive heart and chronic kidney disease with heart failure and stage 1 through stage 4 chronic kidney disease, or unspecified chronic kidney disease: Secondary | ICD-10-CM | POA: Diagnosis not present

## 2020-05-22 DIAGNOSIS — R42 Dizziness and giddiness: Secondary | ICD-10-CM | POA: Diagnosis not present

## 2020-05-22 DIAGNOSIS — N1832 Chronic kidney disease, stage 3b: Secondary | ICD-10-CM | POA: Diagnosis not present

## 2020-05-22 DIAGNOSIS — J9601 Acute respiratory failure with hypoxia: Secondary | ICD-10-CM | POA: Diagnosis not present

## 2020-05-22 DIAGNOSIS — K219 Gastro-esophageal reflux disease without esophagitis: Secondary | ICD-10-CM | POA: Diagnosis not present

## 2020-05-24 DIAGNOSIS — N1832 Chronic kidney disease, stage 3b: Secondary | ICD-10-CM | POA: Diagnosis not present

## 2020-05-24 DIAGNOSIS — E039 Hypothyroidism, unspecified: Secondary | ICD-10-CM | POA: Diagnosis not present

## 2020-05-24 DIAGNOSIS — N390 Urinary tract infection, site not specified: Secondary | ICD-10-CM | POA: Diagnosis not present

## 2020-05-24 DIAGNOSIS — J9601 Acute respiratory failure with hypoxia: Secondary | ICD-10-CM | POA: Diagnosis not present

## 2020-05-24 DIAGNOSIS — K219 Gastro-esophageal reflux disease without esophagitis: Secondary | ICD-10-CM | POA: Diagnosis not present

## 2020-05-24 DIAGNOSIS — D631 Anemia in chronic kidney disease: Secondary | ICD-10-CM | POA: Diagnosis not present

## 2020-05-24 DIAGNOSIS — R42 Dizziness and giddiness: Secondary | ICD-10-CM | POA: Diagnosis not present

## 2020-05-24 DIAGNOSIS — I482 Chronic atrial fibrillation, unspecified: Secondary | ICD-10-CM | POA: Diagnosis not present

## 2020-05-24 DIAGNOSIS — Z9181 History of falling: Secondary | ICD-10-CM | POA: Diagnosis not present

## 2020-05-24 DIAGNOSIS — I5033 Acute on chronic diastolic (congestive) heart failure: Secondary | ICD-10-CM | POA: Diagnosis not present

## 2020-05-24 DIAGNOSIS — I130 Hypertensive heart and chronic kidney disease with heart failure and stage 1 through stage 4 chronic kidney disease, or unspecified chronic kidney disease: Secondary | ICD-10-CM | POA: Diagnosis not present

## 2020-05-29 DIAGNOSIS — J9621 Acute and chronic respiratory failure with hypoxia: Secondary | ICD-10-CM | POA: Diagnosis not present

## 2020-05-29 NOTE — Progress Notes (Signed)
Cardiology Office Note  Date: 05/30/2020   ID: Renee Blake, DOB 1928/07/30, MRN [id number]  PCP:  [name], MD  Cardiologist:  No primary care provider on file. Electrophysiologist:  None   Chief Complaint: Follow-up chronic diastolic heart failure  History of Present Illness: Renee Blake is a 87 y.o. female with a history of chronic diastolic heart failure, PAF, anemia, CKD stage III, history of GIB  Previous hospitalization January 2021 for worsening confusion found to be in rapid atrial fibrillation and acute CHF.  Transitioned to torsemide 40 mg twice daily at discharge.  Palliative care consulted in setting of failure to thrive.  Discharged on home hospice service.  Last visit Dr. Bronson Ing 07/27/2019.  She had been stable and her legs were not swollen.  Hospitalists  told her she could take an extra tablet of torsemide as needed.  She was symptomatically stable with her atrial fibrillation on Cardizem SR 90 mg p.o. twice daily and Toprol-XL 50 mg daily.  Not on anticoagulation due to history of chronic anemia and GI bleeding.  Blood pressure was normal.  There were no changes to therapy.  Hemoglobin was 10.3 on 04/26/2019.  Baseline creatinine was 1.1-1.2 range.  Dr. Bronson Ing suggested if renal function had declined since hospitalization torsemide should be reduced to 40 mg daily.  Recent presentation to [facility] emergency department 03/27/2020 for complaints of abdominal pain and feeling weak.  EMS found her to be hypotensive.  She was blaming it on taking her blood pressure medicines to close together.  She complained of head and neck pain.  Stated she felt weak and nauseous.  CBC was normal.  Low but stable hemoglobin.  Mild elevation of BUN and creatinine reflecting some dehydration per ER provider.  Urinalysis indicated white cells and nitrate positive.  She had IV fluids and antibiotics with improvement in her blood pressure.  Chest x-ray negative for any acute  process.  She was discharged on Keflex and Zofran.  Diagnosis of UTI, dehydration, weakness.  He is here for 73-month follow-up today.  She denies any recent issues other than above presentation for abdominal pain and weakness with diagnosis of dehydration, UTI.  States she is having some issues with sleeping.  She denies any anginal or exertional symptoms, palpitations or arrhythmias, orthostatic symptoms, CVA or TIA-like symptoms, PND, orthopnea, bleeding, claudication, DVT or PE-like symptoms, lower extremity edema.  Advised her to contact her PCP for medication to help her sleep.  Past Medical History:  Diagnosis Date  . Acute blood loss anemia   . Atrial fibrillation ([place])    a. diagnosed in 12/2018. Rate-control pursued given not a candidate for anticoagulation  . Chronic diarrhea   . Dilation of biliary tract   . Duodenal stricture   . Esophageal stricture   . Gastric ulcer   . GI bleed   . Hyperlipemia   . Hypertension   . Microscopic colitis   . Nephrolithiasis   . Pancreatic duct dilated   . Schatzki's ring   . Skin cancer   . Sliding hiatal hernia   . Vertigo     Past Surgical History:  Procedure Laterality Date  . BIOPSY  07/28/2017   Procedure: BIOPSY;  Surgeon: Rogene [place], MD;  [facility];  Service: Endoscopy;;  rectum  . COLONOSCOPY N/A 07/28/2017   Procedure: COLONOSCOPY;  Surgeon: Rogene [place], MD;  [facility];  Service: Endoscopy;  Laterality: N/A;  . ESOPHAGOGASTRODUODENOSCOPY N/A  02/27/2013   Procedure: ESOPHAGOGASTRODUODENOSCOPY (EGD);  Surgeon: Ladene Artist, MD;  Location: [facility] ENDOSCOPY;  Service: Endoscopy;  Laterality: N/A;  . ESOPHAGOGASTRODUODENOSCOPY N/A 03/28/2013   Procedure: ESOPHAGOGASTRODUODENOSCOPY (EGD);  Surgeon: Rogene [place], MD;  [facility];  Service: Endoscopy;  Laterality: N/A;  . HOT HEMOSTASIS N/A 02/27/2013   Procedure: HOT HEMOSTASIS (ARGON PLASMA COAGULATION/BICAP);  Surgeon: Ladene Artist, MD;  Location: [facility] ENDOSCOPY;  Service: Endoscopy;  Laterality: N/A;  . NOSE SURGERY     for skin cancer  . POLYPECTOMY  07/28/2017   Procedure: POLYPECTOMY;  Surgeon: Rogene [place], MD;  [facility]: [facility];  Service: Endoscopy;;  colon     Current Outpatient Medications  Medication Sig Dispense Refill  . acetaminophen (TYLENOL) 325 MG tablet Take 650 mg by mouth every 6 (six) hours as needed for mild pain or moderate pain.    Marland Kitchen ALPRAZolam (XANAX) 0.25 MG tablet Take 1 tablet (0.25 mg total) by mouth 2 (two) times daily as needed. for anxiety 8 tablet 0  . dicyclomine (BENTYL) 10 MG capsule Take 10 mg by mouth every 4 (four) hours as needed for spasms.    Marland Kitchen diltiazem (CARDIZEM SR) 90 MG 12 hr capsule Take 1 capsule (90 mg total) by mouth 2 (two) times daily. 180 capsule 1  . levothyroxine (SYNTHROID) 25 MCG tablet Take 25 mcg by mouth daily before breakfast.    . loperamide (IMODIUM A-D) 2 MG tablet Take 2 mg by mouth daily as needed for diarrhea or loose stools.    . meclizine (ANTIVERT) 25 MG tablet Take 25 mg by mouth daily as needed for dizziness.     . metoprolol succinate (TOPROL-XL) 25 MG 24 hr tablet Take 1 tablet (25 mg total) by mouth daily. 90 tablet 1  . Multiple Vitamin (MULTIVITAMIN WITH MINERALS) TABS tablet Take 1 tablet by mouth daily.    . ondansetron (ZOFRAN) 4 MG tablet Take 1 tablet (4 mg total) by mouth every 8 (eight) hours as needed for nausea or vomiting. 20 tablet 0  . potassium chloride SA (KLOR-CON) 20 MEQ tablet Take 1 tablet (20 mEq total) by mouth daily. 90 tablet 1  . pregabalin (LYRICA) 25 MG capsule Take 1 capsule (25 mg total) by mouth 2 (two) times daily. 60 capsule 1  . torsemide (DEMADEX) 20 MG tablet Take 40 mg in the AM & Take 20 mg in the PM. 270 tablet 1   No current facility-administered medications for this visit.   Allergies:  Clonazepam, Codeine, and Sulfa antibiotics   Social History: The patient  reports that she has never  smoked. She has never used smokeless tobacco. She reports that she does not drink alcohol and does not use drugs.   Family History: The patient's family history includes Heart attack in her father.   ROS:  Please see the history of present illness. Otherwise, complete review of systems is positive for none.  All other systems are reviewed and negative.   Physical Exam: VS:  BP 118/76   Pulse 94   Ht 5\' 6"  (1.676 m)   Wt 118 lb 12.8 oz (53.9 kg)   SpO2 98%   BMI 19.17 kg/m , BMI Body mass index is 19.17 kg/m.  Wt Readings from Last 3 Encounters:  05/30/20 118 lb 12.8 oz (53.9 kg)  03/27/20 118 lb (53.5 kg)  01/31/20 121 lb 8 oz (55.1 kg)    General: Patient appears comfortable at rest. Neck: Supple, no elevated  JVP or carotid bruits, no thyromegaly. Lungs: Clear to auscultation, nonlabored breathing at rest. Cardiac: irregularly irregular rate and rhythm, no S3 or significant systolic murmur, no pericardial rub. Extremities: No pitting edema, distal pulses 2+. Skin: Warm and dry. Musculoskeletal: No kyphosis. Neuropsychiatric: Alert and oriented x3, affect grossly appropriate.  ECG:  EKG 10/18/2019 showed atrial fibrillation rate of 74 right bundle branch block and left anterior fascicular block.  Recent Labwork: 03/27/2020: ALT 33; AST 22; BUN 27; Creatinine, Ser 1.44; Hemoglobin 11.9; Platelets 305; Potassium 4.2; Sodium 137  No results found for: CHOL, TRIG, HDL, CHOLHDL, VLDL, LDLCALC, LDLDIRECT     Other Studies Reviewed Today:  Echocardiogram 04/07/2019 at [place]  1. Technically difficult study due to patient position.  2. The left ventricle is normal in size with mildly increased wall  thickness.  3. The left ventricular systolic function is normal, LVEF is visually  estimated at 60-65%.  4. The left atrium is severely dilated in size.  5. There is severe mostly posteriorly directed mitral regurgitation.  6. The right ventricle is normal  in size, with normal systolic function.  7. The right atrium is moderately to severely dilated in size.  8. There is moderate to severe tricuspid regurgitation.  9. There is mild-moderate pulmonary hypertension, estimated pulmonary artery  systolic pressure is 48 mmHg.       Echocardiogram: 12/2018 IMPRESSIONS  1. Left ventricular ejection fraction, by visual estimation, is 60 to  65%. The left ventricle has normal function. There is no left ventricular  hypertrophy.  2. Elevated left atrial pressure.  3. Left ventricular diastolic parameters are consistent with Grade II  diastolic dysfunction (pseudonormalization).  4. Global right ventricle has normal systolic function.The right  ventricular size is normal. No increase in right ventricular wall  thickness.  5. Left atrial size was severely dilated.  6. Right atrial size was mildly dilated.  7. The mitral valve is abnormal. Moderate mitral valve regurgitation. No  evidence of mitral stenosis.  8. The MR vena contracta is 0.6 cm. The MV/AV TVI ratio is 1.1. Findings  support moderate mitral regurgitation.  9. The tricuspid valve is normal in structure. Tricuspid valve  regurgitation is mild.  10. The aortic valve is tricuspid. Aortic valve regurgitation is not  visualized. No evidence of aortic valve sclerosis or stenosis.  11. The pulmonic valve was not well visualized. Pulmonic valve  regurgitation is mild.  12. Moderately elevated pulmonary artery systolic pressure.  13. The inferior vena cava is normal in size with greater than 50%  respiratory variability, suggesting right atrial pressure of 3 mmHg.   Assessment and Plan:   1. Chronic diastolic heart failure (HCC) Denies any shortness of breath or dyspnea on exertion, PND, orthopnea, weight gain, lower extremity edema.  Continue torsemide 40 mg a.m. and 20 mg p.m. metoprolol 25 mg daily.  Continue potassium supplementation.  2. Persistent atrial  fibrillation ([place]) Continues in atrial fibrillation with rate controlled at 94..  Continue diltiazem SR 90 mg po bid .  Continue Toprol XL 25 mg daily. Not on anticoagulation d/t hx of GIB  3. Essential hypertension Blood pressure well controlled today at 118/76.  Continue Toprol-XL 25 mg daily.  4. Anemia, unspecified type Labs from PCP 04/10/2020 demonstrated hemoglobin 11.8 and hematocrit of 36.1%  No complaints of active bleeding or black tarry stools.  5. Stage 3 chronic kidney disease, unspecified whether stage 3a or 3b CKD 04/10/2020 labs from PCP: Creatinine 1.08, GFR 46.  6.  Severe mitral regurgitation Most recent echo on 04/07/2019 showed severe posteriorly directed mitral regurgitation.  Patient is asymptomatic.  Medication Adjustments/Labs and Tests Ordered: Current medicines are reviewed at length with the patient today.  Concerns regarding medicines are outlined above.   Disposition: Follow-up with Dr. Domenic Polite or APP 6 months.  Signed, Levell July, NP 05/30/2020 2:22 PM    [facility] [place], [place], [place] [address] Phone: [phone]; Fax: [phone]

## 2020-05-30 ENCOUNTER — Encounter: Payer: Self-pay | Admitting: Family Medicine

## 2020-05-30 ENCOUNTER — Ambulatory Visit (INDEPENDENT_AMBULATORY_CARE_PROVIDER_SITE_OTHER): Payer: Medicare Other | Admitting: Family Medicine

## 2020-05-30 VITALS — BP 118/76 | HR 94 | Ht 66.0 in | Wt 118.8 lb

## 2020-05-30 DIAGNOSIS — I5032 Chronic diastolic (congestive) heart failure: Secondary | ICD-10-CM | POA: Diagnosis not present

## 2020-05-30 DIAGNOSIS — I4819 Other persistent atrial fibrillation: Secondary | ICD-10-CM | POA: Diagnosis not present

## 2020-05-30 DIAGNOSIS — N183 Chronic kidney disease, stage 3 unspecified: Secondary | ICD-10-CM

## 2020-05-30 DIAGNOSIS — D649 Anemia, unspecified: Secondary | ICD-10-CM

## 2020-05-30 DIAGNOSIS — I10 Essential (primary) hypertension: Secondary | ICD-10-CM | POA: Diagnosis not present

## 2020-05-30 DIAGNOSIS — I340 Nonrheumatic mitral (valve) insufficiency: Secondary | ICD-10-CM

## 2020-05-30 NOTE — Patient Instructions (Signed)
Medication Instructions:  Continue all current medications.   Labwork: none  Testing/Procedures: none  Follow-Up: 6 months   Any Other Special Instructions Will Be Listed Below (If Applicable).   If you need a refill on your cardiac medications before your next appointment, please call your pharmacy.  

## 2020-05-31 DIAGNOSIS — I130 Hypertensive heart and chronic kidney disease with heart failure and stage 1 through stage 4 chronic kidney disease, or unspecified chronic kidney disease: Secondary | ICD-10-CM | POA: Diagnosis not present

## 2020-05-31 DIAGNOSIS — N390 Urinary tract infection, site not specified: Secondary | ICD-10-CM | POA: Diagnosis not present

## 2020-05-31 DIAGNOSIS — Z9181 History of falling: Secondary | ICD-10-CM | POA: Diagnosis not present

## 2020-05-31 DIAGNOSIS — R42 Dizziness and giddiness: Secondary | ICD-10-CM | POA: Diagnosis not present

## 2020-05-31 DIAGNOSIS — N1832 Chronic kidney disease, stage 3b: Secondary | ICD-10-CM | POA: Diagnosis not present

## 2020-05-31 DIAGNOSIS — I482 Chronic atrial fibrillation, unspecified: Secondary | ICD-10-CM | POA: Diagnosis not present

## 2020-05-31 DIAGNOSIS — I5033 Acute on chronic diastolic (congestive) heart failure: Secondary | ICD-10-CM | POA: Diagnosis not present

## 2020-05-31 DIAGNOSIS — K219 Gastro-esophageal reflux disease without esophagitis: Secondary | ICD-10-CM | POA: Diagnosis not present

## 2020-05-31 DIAGNOSIS — D631 Anemia in chronic kidney disease: Secondary | ICD-10-CM | POA: Diagnosis not present

## 2020-05-31 DIAGNOSIS — J9601 Acute respiratory failure with hypoxia: Secondary | ICD-10-CM | POA: Diagnosis not present

## 2020-05-31 DIAGNOSIS — E039 Hypothyroidism, unspecified: Secondary | ICD-10-CM | POA: Diagnosis not present

## 2020-06-26 DIAGNOSIS — G8929 Other chronic pain: Secondary | ICD-10-CM | POA: Diagnosis not present

## 2020-06-26 DIAGNOSIS — R110 Nausea: Secondary | ICD-10-CM | POA: Diagnosis not present

## 2020-06-29 DIAGNOSIS — J9621 Acute and chronic respiratory failure with hypoxia: Secondary | ICD-10-CM | POA: Diagnosis not present

## 2020-07-25 ENCOUNTER — Other Ambulatory Visit: Payer: Self-pay

## 2020-07-25 ENCOUNTER — Encounter (HOSPITAL_COMMUNITY): Payer: Self-pay

## 2020-07-25 ENCOUNTER — Observation Stay (HOSPITAL_COMMUNITY)
Admission: EM | Admit: 2020-07-25 | Discharge: 2020-07-26 | Disposition: A | Discharge status: Home / Self Care | Payer: Medicare Other | Attending: Internal Medicine | Admitting: Internal Medicine

## 2020-07-25 ENCOUNTER — Emergency Department (HOSPITAL_COMMUNITY): Payer: Medicare Other

## 2020-07-25 DIAGNOSIS — Z85828 Personal history of other malignant neoplasm of skin: Secondary | ICD-10-CM | POA: Insufficient documentation

## 2020-07-25 DIAGNOSIS — I5031 Acute diastolic (congestive) heart failure: Secondary | ICD-10-CM | POA: Diagnosis present

## 2020-07-25 DIAGNOSIS — I110 Hypertensive heart disease with heart failure: Secondary | ICD-10-CM | POA: Diagnosis not present

## 2020-07-25 DIAGNOSIS — E119 Type 2 diabetes mellitus without complications: Secondary | ICD-10-CM | POA: Diagnosis not present

## 2020-07-25 DIAGNOSIS — N1832 Chronic kidney disease, stage 3b: Secondary | ICD-10-CM | POA: Insufficient documentation

## 2020-07-25 DIAGNOSIS — Z79899 Other long term (current) drug therapy: Secondary | ICD-10-CM | POA: Diagnosis not present

## 2020-07-25 DIAGNOSIS — I482 Chronic atrial fibrillation, unspecified: Secondary | ICD-10-CM | POA: Diagnosis not present

## 2020-07-25 DIAGNOSIS — I130 Hypertensive heart and chronic kidney disease with heart failure and stage 1 through stage 4 chronic kidney disease, or unspecified chronic kidney disease: Secondary | ICD-10-CM | POA: Diagnosis not present

## 2020-07-25 DIAGNOSIS — I509 Heart failure, unspecified: Secondary | ICD-10-CM

## 2020-07-25 DIAGNOSIS — I5033 Acute on chronic diastolic (congestive) heart failure: Secondary | ICD-10-CM | POA: Diagnosis not present

## 2020-07-25 DIAGNOSIS — R21 Rash and other nonspecific skin eruption: Secondary | ICD-10-CM | POA: Diagnosis not present

## 2020-07-25 DIAGNOSIS — R0689 Other abnormalities of breathing: Secondary | ICD-10-CM | POA: Diagnosis not present

## 2020-07-25 DIAGNOSIS — I10 Essential (primary) hypertension: Secondary | ICD-10-CM | POA: Diagnosis not present

## 2020-07-25 DIAGNOSIS — I5023 Acute on chronic systolic (congestive) heart failure: Secondary | ICD-10-CM | POA: Diagnosis not present

## 2020-07-25 DIAGNOSIS — Z20822 Contact with and (suspected) exposure to covid-19: Secondary | ICD-10-CM | POA: Insufficient documentation

## 2020-07-25 DIAGNOSIS — G4489 Other headache syndrome: Secondary | ICD-10-CM | POA: Diagnosis not present

## 2020-07-25 DIAGNOSIS — I517 Cardiomegaly: Secondary | ICD-10-CM | POA: Diagnosis not present

## 2020-07-25 DIAGNOSIS — R0602 Shortness of breath: Secondary | ICD-10-CM | POA: Diagnosis not present

## 2020-07-25 DIAGNOSIS — E079 Disorder of thyroid, unspecified: Secondary | ICD-10-CM | POA: Diagnosis not present

## 2020-07-25 DIAGNOSIS — R069 Unspecified abnormalities of breathing: Secondary | ICD-10-CM | POA: Diagnosis not present

## 2020-07-25 LAB — BRAIN NATRIURETIC PEPTIDE: B Natriuretic Peptide: 301 pg/mL — ABNORMAL HIGH (ref 0.0–100.0)

## 2020-07-25 LAB — URINALYSIS, ROUTINE W REFLEX MICROSCOPIC
Bilirubin Urine: NEGATIVE
Glucose, UA: NEGATIVE mg/dL
Hgb urine dipstick: NEGATIVE
Ketones, ur: NEGATIVE mg/dL
Nitrite: NEGATIVE
Protein, ur: NEGATIVE mg/dL
Specific Gravity, Urine: 1.008 (ref 1.005–1.030)
pH: 6 (ref 5.0–8.0)

## 2020-07-25 LAB — COMPREHENSIVE METABOLIC PANEL
ALT: 19 U/L (ref 0–44)
AST: 27 U/L (ref 15–41)
Albumin: 4.1 g/dL (ref 3.5–5.0)
Alkaline Phosphatase: 82 U/L (ref 38–126)
Anion gap: 10 (ref 5–15)
BUN: 18 mg/dL (ref 8–23)
CO2: 25 mmol/L (ref 22–32)
Calcium: 8.9 mg/dL (ref 8.9–10.3)
Chloride: 100 mmol/L (ref 98–111)
Creatinine, Ser: 1.21 mg/dL — ABNORMAL HIGH (ref 0.44–1.00)
GFR, Estimated: 43 mL/min — ABNORMAL LOW (ref >60)
Glucose, Bld: 106 mg/dL — ABNORMAL HIGH (ref 70–99)
Potassium: 4.1 mmol/L (ref 3.5–5.1)
Sodium: 135 mmol/L (ref 135–145)
Total Bilirubin: 0.6 mg/dL (ref 0.3–1.2)
Total Protein: 7.9 g/dL (ref 6.5–8.1)

## 2020-07-25 LAB — CBC WITH DIFFERENTIAL/PLATELET
Abs Immature Granulocytes: 0.02 10*3/uL (ref 0.00–0.07)
Basophils Absolute: 0 10*3/uL (ref 0.0–0.1)
Basophils Relative: 0 %
Eosinophils Absolute: 0.1 10*3/uL (ref 0.0–0.5)
Eosinophils Relative: 2 %
HCT: 37.7 % (ref 36.0–46.0)
Hemoglobin: 11.9 g/dL — ABNORMAL LOW (ref 12.0–15.0)
Immature Granulocytes: 0 %
Lymphocytes Relative: 21 %
Lymphs Abs: 1.5 10*3/uL (ref 0.7–4.0)
MCH: 30.5 pg (ref 26.0–34.0)
MCHC: 31.6 g/dL (ref 30.0–36.0)
MCV: 96.7 fL (ref 80.0–100.0)
Monocytes Absolute: 0.7 10*3/uL (ref 0.1–1.0)
Monocytes Relative: 10 %
Neutro Abs: 4.6 10*3/uL (ref 1.7–7.7)
Neutrophils Relative %: 67 %
Platelets: 326 10*3/uL (ref 150–400)
RBC: 3.9 MIL/uL (ref 3.87–5.11)
RDW: 14.1 % (ref 11.5–15.5)
WBC: 6.9 10*3/uL (ref 4.0–10.5)
nRBC: 0 % (ref 0.0–0.2)

## 2020-07-25 LAB — RESP PANEL BY RT-PCR (FLU A&B, COVID) ARPGX2
Influenza A by PCR: NEGATIVE
Influenza B by PCR: NEGATIVE
SARS Coronavirus 2 by RT PCR: NEGATIVE

## 2020-07-25 LAB — LIPASE, BLOOD: Lipase: 25 U/L (ref 11–51)

## 2020-07-25 LAB — MAGNESIUM: Magnesium: 2 mg/dL (ref 1.7–2.4)

## 2020-07-25 IMAGING — DX DG CHEST 1V PORT
1 series · 1 of 1 positions shown · non-contrast
Comparison: Chest radiograph dated [DATE] and chest CT dated
[DATE].

CLINICAL DATA: [AGE] female with shortness of breath.

EXAM:
PORTABLE CHEST 1 VIEW

[chest ap]
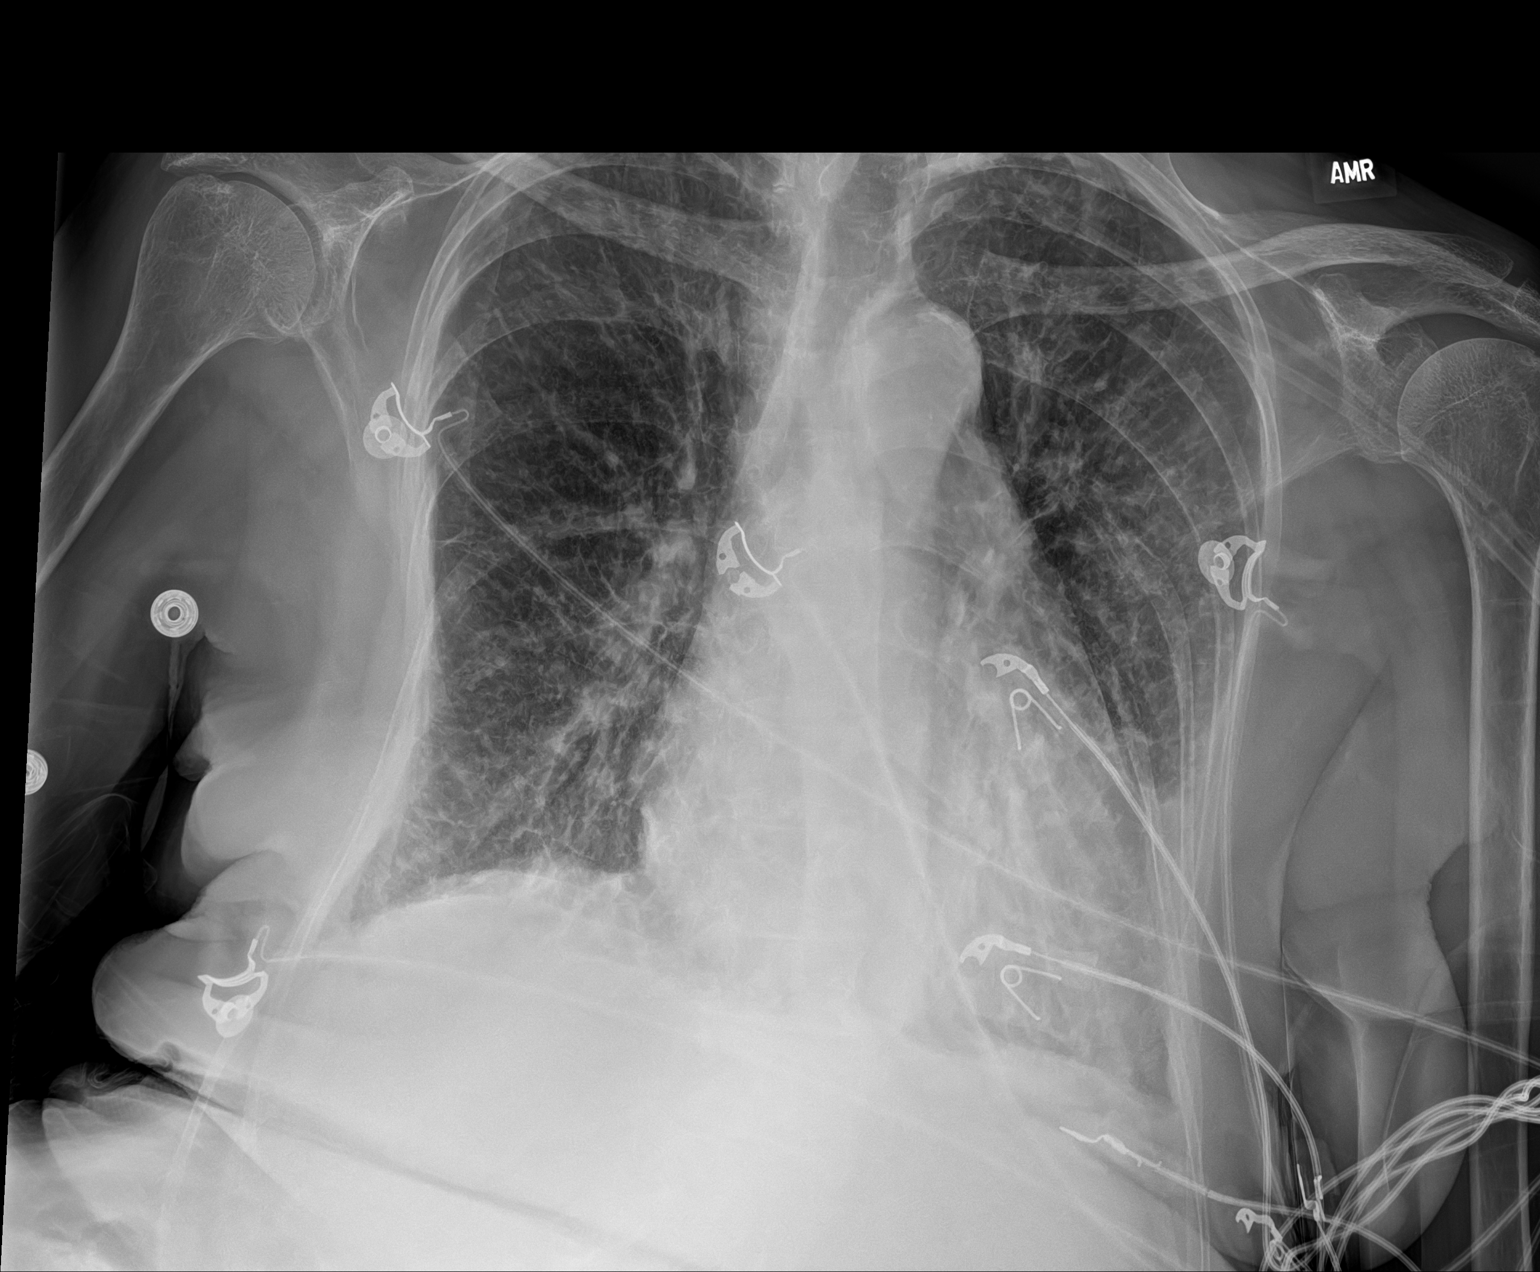

[1 of 1 positions shown; findings below may reference images not displayed]

FINDINGS: Bibasilar densities, likely combination of small pleural effusions
and associated atelectasis as seen on the prior CT. Pneumonia is not
excluded clinical correlation is recommended. There is diffuse
interstitial prominence which may be chronic although superimposed
edema is not excluded. There is no pneumothorax. Mild cardiomegaly.
Atherosclerotic calcification of the aorta. No acute osseous
pathology.
IMPRESSION: Bibasilar densities, likely combination of small pleural effusions
and associated atelectasis. Pneumonia is not excluded.

## 2020-07-25 MED ORDER — FUROSEMIDE 10 MG/ML IJ SOLN
60.0000 mg | Freq: Two times a day (BID) | INTRAMUSCULAR | Status: DC
  Administered 2020-07-26: 60 mg via INTRAVENOUS
  Filled 2020-07-25: qty 6

## 2020-07-25 MED ORDER — ONDANSETRON HCL 4 MG PO TABS: 4.0000 mg | ORAL_TABLET | Freq: Four times a day (QID) | ORAL | Status: DC | PRN

## 2020-07-25 MED ORDER — METOPROLOL SUCCINATE ER 25 MG PO TB24
25.0000 mg | ORAL_TABLET | Freq: Every day | ORAL | Status: DC
  Administered 2020-07-26: 25 mg via ORAL
  Filled 2020-07-25: qty 1

## 2020-07-25 MED ORDER — FUROSEMIDE 10 MG/ML IJ SOLN
60.0000 mg | Freq: Once | INTRAMUSCULAR | Status: AC
  Administered 2020-07-25: 60 mg via INTRAVENOUS
  Filled 2020-07-25: qty 6

## 2020-07-25 MED ORDER — ACETAMINOPHEN 325 MG PO TABS
650.0000 mg | ORAL_TABLET | Freq: Four times a day (QID) | ORAL | Status: DC | PRN
  Administered 2020-07-26: 650 mg via ORAL
  Filled 2020-07-25: qty 2

## 2020-07-25 MED ORDER — ACETAMINOPHEN 650 MG RE SUPP: 650.0000 mg | Freq: Four times a day (QID) | RECTAL | Status: DC | PRN

## 2020-07-25 MED ORDER — PREGABALIN 25 MG PO CAPS: 25.0000 mg | ORAL_CAPSULE | Freq: Two times a day (BID) | ORAL | Status: DC

## 2020-07-25 MED ORDER — TRAMADOL HCL 50 MG PO TABS: 50.0000 mg | ORAL_TABLET | Freq: Four times a day (QID) | ORAL | Status: DC | PRN

## 2020-07-25 MED ORDER — ALPRAZOLAM 0.25 MG PO TABS: 0.2500 mg | ORAL_TABLET | Freq: Two times a day (BID) | ORAL | Status: DC | PRN

## 2020-07-25 MED ORDER — LEVOTHYROXINE SODIUM 50 MCG PO TABS
50.0000 ug | ORAL_TABLET | Freq: Every day | ORAL | Status: DC
  Administered 2020-07-26: 50 ug via ORAL
  Filled 2020-07-25: qty 1

## 2020-07-25 MED ORDER — HEPARIN SODIUM (PORCINE) 5000 UNIT/ML IJ SOLN
5000.0000 [IU] | Freq: Three times a day (TID) | INTRAMUSCULAR | Status: DC
  Administered 2020-07-26 (×2): 5000 [IU] via SUBCUTANEOUS
  Filled 2020-07-25 (×3): qty 1

## 2020-07-25 MED ORDER — ONDANSETRON HCL 4 MG/2ML IJ SOLN: 4.0000 mg | Freq: Four times a day (QID) | INTRAMUSCULAR | Status: DC | PRN

## 2020-07-25 MED ORDER — OXYCODONE HCL 5 MG PO TABS
5.0000 mg | ORAL_TABLET | ORAL | Status: DC | PRN
Start: 2020-07-25 — End: 2020-07-26

## 2020-07-25 MED ORDER — DILTIAZEM HCL ER 90 MG PO CP12
90.0000 mg | ORAL_CAPSULE | Freq: Two times a day (BID) | ORAL | Status: DC
  Administered 2020-07-25 – 2020-07-26 (×2): 90 mg via ORAL
  Filled 2020-07-25 (×6): qty 1

## 2020-07-25 NOTE — ED Notes (Signed)
XR bedside.

## 2020-07-25 NOTE — ED Notes (Signed)
ED Provider at bedside. 

## 2020-07-25 NOTE — ED Provider Notes (Signed)
[facility]Y [facility]PART[facility] Provider Note   CSN: [id number] Arrival date & time: 07/25/20  1438     History Chief Complaint  Patient presents with  . Shortness of Breath    [name] is a 87 y.o. female with a history of atrial fibrillation, not a candidate for anticoagulation, hypertension, CHF,, chronic renal failure stage IIIb,, type 2 diabetes, history of frequent UTIs, presenting for evaluation of increasing shortness of breath.  She states she has been feeling short of breath for the past week which worsened today.  She does use as needed home oxygen, which has not been helpful this morning.  She states her ankles were swollen this morning which improved after elevation she also states she had a headache this morning which is currently gone.  She denies chest pain, she does endorse nausea without emesis and has had intermittent lower abdominal pain which is currently quiescent.  She denies dysuria or increased urinary frequency, denies constipation or diarrhea, last bowel movement was this morning and normal.  She denies coughing, fevers or chills, no other complaints, but states "just has not felt well".  HPI     Past Medical History:  Diagnosis Date  . Acute blood loss anemia   . Atrial fibrillation (HCC)    a. diagnosed in 12/2018. Rate-control pursued given not a candidate for anticoagulation  . Chronic diarrhea   . Dilation of biliary tract   . Duodenal stricture   . Esophageal stricture   . Gastric ulcer   . GI bleed   . Hyperlipemia   . Hypertension   . Microscopic colitis   . Nephrolithiasis   . Pancreatic duct dilated   . Schatzki's ring   . Skin cancer   . Sliding hiatal hernia   . Vertigo     Patient Active Problem List   Diagnosis Date Noted  . Acute pulmonary edema (HCC)   . Longstanding persistent atrial fibrillation (HCC)   . Palliative care by specialist   . Encounter for hospice care discussion   . Protein-calorie malnutrition, severe  04/27/2019  . Acute on chronic diastolic (congestive) heart failure (HCC) 04/27/2019  . Normocytic anemia 04/26/2019  . Goals of care, counseling/discussion   . Palliative care encounter   . Hypotension 04/17/2019  . Debility 03/16/2019  . Shortness of breath 03/16/2019  . Cachexia (HCC) 03/16/2019  . Permanent atrial fibrillation (HCC)   . Hypoxia 03/15/2019  . CHF exacerbation (HCC) 03/14/2019  . Bilateral pleural effusion 03/04/2019  . Chronic atrial fibrillation (HCC) 03/04/2019  . DNR (do not resuscitate) 03/04/2019  . Acute on chronic diastolic congestive heart failure (HCC) 03/02/2019  . Chronic diastolic heart failure (HCC) 03/02/2019  . Chronic renal failure, stage 3b (HCC) 03/02/2019  . Acute respiratory failure with hypoxia (HCC) 03/02/2019  . Acute exacerbation of CHF (congestive heart failure) (HCC) 01/27/2019  . Atrial fibrillation with RVR (HCC) 01/27/2019  . Iron deficiency anemia due to chronic blood loss---H/o Gi Bleed 01/27/2019  . Vaginal mass 07/29/2017  . Heme positive stool 07/25/2017  . Cellulitis 12/10/2016  . Facial cellulitis   . Erroneous encounter - disregard 03/17/2015  . AKI (acute kidney injury) (HCC)   . Metabolic acidosis 08/23/2014  . Acute renal failure syndrome (HCC)   . Renal failure 08/22/2014  . Hyperkalemia 08/22/2014  . UTI (urinary tract infection) 08/22/2014  . Acute renal failure (HCC) 08/22/2014  . Dyspnea 03/15/2014  . Type 2 diabetes mellitus (HCC) 06/08/2013  . Acute diastolic congestive heart  failure ([place]) 06/07/2013  . Syncope 03/26/2013  . Rectal bleeding 03/26/2013  . Dehydration 03/26/2013  . Generalized weakness 03/26/2013  . Malnutrition of moderate degree ([place]) 03/01/2013  . Diarrhea 02/28/2013  . Loss of weight 02/28/2013  . H/o Prior duodenal ulcer with hemorrhage 02/27/2013  . H/o Prior Lower GI bleed (anorectal Ca) 02/26/2013  . Acute blood loss anemia 02/26/2013  . UTI (lower urinary tract infection)  02/26/2013  . Hypertension 02/26/2013    Past Surgical History:  Procedure Laterality Date  . BIOPSY  07/28/2017   Procedure: BIOPSY;  Surgeon: Rogene [place], MD;  [facility];  Service: Endoscopy;;  rectum  . COLONOSCOPY N/A 07/28/2017   Procedure: COLONOSCOPY;  Surgeon: Rogene [place], MD;  [facility];  Service: Endoscopy;  Laterality: N/A;  . ESOPHAGOGASTRODUODENOSCOPY N/A 02/27/2013   Procedure: ESOPHAGOGASTRODUODENOSCOPY (EGD);  Surgeon: Ladene Artist, MD;  Location: [facility] ENDOSCOPY;  Service: Endoscopy;  Laterality: N/A;  . ESOPHAGOGASTRODUODENOSCOPY N/A 03/28/2013   Procedure: ESOPHAGOGASTRODUODENOSCOPY (EGD);  Surgeon: Rogene [place], MD;  [facility];  Service: Endoscopy;  Laterality: N/A;  . HOT HEMOSTASIS N/A 02/27/2013   Procedure: HOT HEMOSTASIS (ARGON PLASMA COAGULATION/BICAP);  Surgeon: Ladene Artist, MD;  Location: [facility] ENDOSCOPY;  Service: Endoscopy;  Laterality: N/A;  . NOSE SURGERY     for skin cancer  . POLYPECTOMY  07/28/2017   Procedure: POLYPECTOMY;  Surgeon: Rogene [place], MD;  [facility];  Service: Endoscopy;;  colon      OB History    Gravida  6   Para  6   Term  6   Preterm      AB      Living  6     SAB      IAB      Ectopic      Multiple      Live Births              Family History  Problem Relation Age of Onset  . Heart attack Father     Social History   Tobacco Use  . Smoking status: Never Smoker  . Smokeless tobacco: Never Used  Vaping Use  . Vaping Use: Never used  Substance Use Topics  . Alcohol use: No    Alcohol/week: 0.0 standard drinks  . Drug use: No    Home Medications Prior to Admission medications   Medication Sig Start Date End Date Taking? Authorizing Provider  acetaminophen (TYLENOL) 325 MG tablet Take 650 mg by mouth every 6 (six) hours as needed for mild pain or moderate pain.    [provider]  ALPRAZolam Duanne Moron) 0.25 MG tablet Take  1 tablet (0.25 mg total) by mouth 2 (two) times daily as needed. for anxiety 04/20/19   Manuella Ghazi, Pratik D, DO  dicyclomine (BENTYL) 10 MG capsule Take 10 mg by mouth every 4 (four) hours as needed for spasms.    [provider]  diltiazem (CARDIZEM SR) 90 MG 12 hr capsule Take 1 capsule (90 mg total) by mouth 2 (two) times daily. 05/17/20   Verta Ellen., NP  levothyroxine (SYNTHROID) 25 MCG tablet Take 25 mcg by mouth daily before breakfast.    [provider]  loperamide (IMODIUM A-D) 2 MG tablet Take 2 mg by mouth daily as needed for diarrhea or loose stools.    [provider]  meclizine (ANTIVERT) 25 MG tablet Take 25 mg by mouth daily as needed for  dizziness.  07/11/17   [provider]  metoprolol succinate (TOPROL-XL) 25 MG 24 hr tablet Take 1 tablet (25 mg total) by mouth daily. 05/17/20 08/15/20  Verta Ellen., NP  Multiple Vitamin (MULTIVITAMIN WITH MINERALS) TABS tablet Take 1 tablet by mouth daily. 03/19/19   Orson Eva, MD  ondansetron (ZOFRAN) 4 MG tablet Take 1 tablet (4 mg total) by mouth every 8 (eight) hours as needed for nausea or vomiting. 03/27/20   Hayden Rasmussen, MD  potassium chloride SA (KLOR-CON) 20 MEQ tablet Take 1 tablet (20 mEq total) by mouth daily. 05/17/20   Verta Ellen., NP  pregabalin (LYRICA) 25 MG capsule Take 1 capsule (25 mg total) by mouth 2 (two) times daily. 05/04/19   Orson Eva, MD  torsemide (DEMADEX) 20 MG tablet Take 40 mg in the AM & Take 20 mg in the PM. 05/17/20   Verta Ellen., NP    Allergies    Clonazepam, Codeine, and Sulfa antibiotics  Review of Systems   Review of Systems  Constitutional: Positive for fatigue. Negative for chills and fever.  HENT: Negative for congestion and sore throat.   Eyes: Negative.   Respiratory: Positive for shortness of breath. Negative for chest tightness.   Cardiovascular: Negative for chest pain.  Gastrointestinal: Positive for abdominal pain and nausea.  Negative for constipation, diarrhea and vomiting.  Genitourinary: Negative.  Negative for dysuria and hematuria.  Musculoskeletal: Negative for arthralgias, joint swelling and neck pain.  Skin: Negative.  Negative for rash and wound.  Neurological: Positive for headaches. Negative for dizziness, weakness, light-headedness and numbness.  Psychiatric/Behavioral: Negative.     Physical Exam Updated Vital Signs BP (!) 141/90   Pulse 71   Temp 98.1 F (36.7 C)   Resp (!) 25   Ht 5\' 4"  (1.626 m)   Wt 53.1 kg   SpO2 100%   BMI 20.08 kg/m   Physical Exam Vitals and nursing note reviewed.  Constitutional:      Appearance: She is well-developed.  HENT:     Head: Normocephalic and atraumatic.  Eyes:     Conjunctiva/sclera: Conjunctivae normal.  Cardiovascular:     Rate and Rhythm: Normal rate. Rhythm irregularly irregular.     Heart sounds: Normal heart sounds.  Pulmonary:     Effort: Pulmonary effort is normal.     Breath sounds: Examination of the right-lower field reveals rales. Examination of the left-lower field reveals rales. Rales present. No wheezing or rhonchi.  Chest:     Chest wall: No tenderness.  Abdominal:     General: Bowel sounds are normal.     Palpations: Abdomen is soft.     Tenderness: There is no abdominal tenderness.  Musculoskeletal:        General: Normal range of motion.     Cervical back: Normal range of motion.     Right lower leg: No edema.     Left lower leg: No edema.  Skin:    General: Skin is warm and dry.  Neurological:     Mental Status: She is alert.     ED Results / Procedures / Treatments   Labs (all labs ordered are listed, but only abnormal results are displayed) Labs Reviewed  BRAIN NATRIURETIC PEPTIDE - Abnormal; Notable for the following components:      Result Value   B Natriuretic Peptide 301.0 (*)    All other components within normal limits  COMPREHENSIVE METABOLIC PANEL - Abnormal; Notable for  the following components:    Glucose, Bld 106 (*)    Creatinine, Ser 1.21 (*)    GFR, Estimated 43 (*)    All other components within normal limits  CBC WITH DIFFERENTIAL/PLATELET - Abnormal; Notable for the following components:   Hemoglobin 11.9 (*)    All other components within normal limits  RESP PANEL BY RT-PCR (FLU A&B, COVID) ARPGX2  MAGNESIUM  LIPASE, BLOOD  URINALYSIS, ROUTINE W REFLEX MICROSCOPIC    EKG None  Radiology DG Chest Portable 1 View  Result Date: 07/25/2020 CLINICAL DATA:  87 year old female with shortness of breath. EXAM: PORTABLE CHEST 1 VIEW COMPARISON:  Chest radiograph dated 03/27/2020 and chest CT dated 04/26/2019. FINDINGS: Bibasilar densities, likely combination of small pleural effusions and associated atelectasis as seen on the prior CT. Pneumonia is not excluded clinical correlation is recommended. There is diffuse interstitial prominence which may be chronic although superimposed edema is not excluded. There is no pneumothorax. Mild cardiomegaly. Atherosclerotic calcification of the aorta. No acute osseous pathology. IMPRESSION: Bibasilar densities, likely combination of small pleural effusions and associated atelectasis. Pneumonia is not excluded. Electronically Signed   By: Anner Crete M.D.   On: 07/25/2020 16:04    Procedures Procedures   Medications Ordered in ED Medications  furosemide (LASIX) injection 60 mg (has no administration in time range)    ED Course  I have reviewed the triage vital signs and the nursing notes.  Pertinent labs & imaging results that were available during my care of the patient were reviewed by me and considered in my medical decision making (see chart for details).    MDM Rules/Calculators/A&P                          Pt with CHF exacerbation after review of labs and CXR.  Exam and hx c/w same.  She denies missing any doses of her demadex (takes 40 mg qam, 20 mg qhs).    Discussed with Dr. Clearence Ped who accepts pt for  admission. Final Clinical Impression(s) / ED Diagnoses Final diagnoses:  Acute on chronic congestive heart failure, unspecified heart failure type [facility])    Rx / DC Orders ED Discharge Orders    None       Landis Martins 07/25/20 1803    Milton Ferguson, MD 07/25/20 2253

## 2020-07-25 NOTE — H&P (Signed)
TRH H&P    Patient Demographics:    Renee Blake, is a 87 y.o. female  MRN: [id number]  DOB - [birth year]  Admit Date - 07/25/2020  Referring MD/NP/PA: Alfonzo Feller  Outpatient Primary MD for the patient is [name], MD  Patient coming from: Home  Chief complaint- CHF   HPI:    Renee Blake  is a 87 y.o. female, with history of vertigo, sliding hiatal hernia, hypertension, hyperlipidemia, chronic diarrhea, atrial fibrillation, and more presents the ED with chief complaint of dyspnea.  Patient reports that around 12 PM today she had gradual onset dyspnea that was associated with nausea.  Dyspnea has been worsening since it started.  She reports that she also had some back pain that she thinks is unrelated and she does have chronic back pain.  She denies any chest pain or palpitations.  She has as needed oxygen at home so she tried that and it did not improve her dyspnea.  She reports resting in her recliner secondary to orthopnea.  She is had intermittent swelling in her legs for the last week.  She does report that she takes a fluid pill regularly, and follows a low-sodium diet.  Patient denies any cough or fever associated with her dyspnea.  She denies any change in urine output.  She has had diarrhea but this is normal for her.  Patient reports that the time of my exam she is in no current pain.  Patient does not smoke, does not drink, does not use illicit drugs.  She is vaccinated for COVID.  Patient is DNR.  Patient's last echo was in October 2020 and showed an EF of 60 to 65% with grade 2 diastolic dysfunction.  She reports that couple years ago she was in the hospital quite frequently with CHF exacerbations but since following a low-sodium diet she has not been in the hospital as frequently.  Of note patient is a difficult historian secondary to early dementia.  She does live alone, but has 2 daughters that live  close by and check on her frequently.  During the medical interview she keeps trying to daughter and asking what did I say was wrong.  She is alert to herself and place as well as context.  In the ED Temperature 96.5, heart rate 56-87, respiratory rate 18-25, blood pressure 141/90, satting 100% on 2 L nasal cannula which was applied for comfort BNP 301 Chest x-ray shows bibasilar densities that are most likely small pleural effusions with atelectasis.  Pneumonia not excluded No leukocytosis with a white blood cell count of 6.9, hemoglobin 11.9 Chemistry panel is unremarkable EKG shows a heart rate of 68, QTc 464, A. Fib Patient was given 60 mg of Lasix and admission was requested for mild CHF exacerbation    Review of systems:    In addition to the HPI above,  No Fever-chills, Admits to headache earlier today, No changes with Vision or hearing, No problems swallowing food or Liquids, No Chest pain, Cough admits to dyspnea No Abdominal pain, admits to nausea  but no vomiting, bowel movements are regular, No Blood in stool or Urine, No dysuria, No new skin rashes or bruises, No new joints pains-aches,  No new weakness, tingling, numbness in any extremity, No recent weight gain or loss, No polyuria, polydypsia or polyphagia, No significant Mental Stressors.  All other systems reviewed and are negative.    Past History of the following :    Past Medical History:  Diagnosis Date  . Acute blood loss anemia   . Atrial fibrillation ([place])    a. diagnosed in 12/2018. Rate-control pursued given not a candidate for anticoagulation  . Chronic diarrhea   . Dilation of biliary tract   . Duodenal stricture   . Esophageal stricture   . Gastric ulcer   . GI bleed   . Hyperlipemia   . Hypertension   . Microscopic colitis   . Nephrolithiasis   . Pancreatic duct dilated   . Schatzki's ring   . Skin cancer   . Sliding hiatal hernia   . Vertigo       Past Surgical History:   Procedure Laterality Date  . BIOPSY  07/28/2017   Procedure: BIOPSY;  Surgeon: Rogene Houston, MD;  [facility];  Service: Endoscopy;;  rectum  . COLONOSCOPY N/A 07/28/2017   Procedure: COLONOSCOPY;  Surgeon: Rogene Houston, MD;  [facility];  Service: Endoscopy;  Laterality: N/A;  . ESOPHAGOGASTRODUODENOSCOPY N/A 02/27/2013   Procedure: ESOPHAGOGASTRODUODENOSCOPY (EGD);  Surgeon: Ladene Artist, MD;  Location: [facility] ENDOSCOPY;  Service: Endoscopy;  Laterality: N/A;  . ESOPHAGOGASTRODUODENOSCOPY N/A 03/28/2013   Procedure: ESOPHAGOGASTRODUODENOSCOPY (EGD);  Surgeon: Rogene Houston, MD;  [facility];  Service: Endoscopy;  Laterality: N/A;  . HOT HEMOSTASIS N/A 02/27/2013   Procedure: HOT HEMOSTASIS (ARGON PLASMA COAGULATION/BICAP);  Surgeon: Ladene Artist, MD;  Location: [facility] ENDOSCOPY;  Service: Endoscopy;  Laterality: N/A;  . NOSE SURGERY     for skin cancer  . POLYPECTOMY  07/28/2017   Procedure: POLYPECTOMY;  Surgeon: Rogene Houston, MD;  [facility];  Service: Endoscopy;;  colon       Social History:      Social History   Tobacco Use  . Smoking status: Never Smoker  . Smokeless tobacco: Never Used  Substance Use Topics  . Alcohol use: No    Alcohol/week: 0.0 standard drinks       Family History :     Family History  Problem Relation Age of Onset  . Heart attack Father       Home Medications:   Prior to Admission medications   Medication Sig Start Date End Date Taking? Authorizing Provider  acetaminophen (TYLENOL) 325 MG tablet Take 650 mg by mouth every 6 (six) hours as needed for mild pain or moderate pain.   Yes [provider]  ALPRAZolam (XANAX) 0.25 MG tablet Take 1 tablet (0.25 mg total) by mouth 2 (two) times daily as needed. for anxiety 04/20/19  Yes Manuella Ghazi, Pratik D, DO  diltiazem (CARDIZEM SR) 90 MG 12 hr capsule Take 1 capsule (90 mg total) by mouth 2 (two) times daily. 05/17/20  Yes Verta Ellen., NP  levothyroxine (SYNTHROID) 50 MCG tablet Take 50 mcg by mouth daily. 07/11/20  Yes [provider]  loperamide (IMODIUM A-D) 2 MG tablet Take 2 mg by mouth daily as needed for diarrhea or loose stools.   Yes [provider]  meclizine (ANTIVERT) 25 MG tablet Take 25 mg by  mouth daily as needed for dizziness.  07/11/17  Yes [provider]  metoprolol succinate (TOPROL-XL) 25 MG 24 hr tablet Take 1 tablet (25 mg total) by mouth daily. 05/17/20 08/15/20 Yes Verta Ellen., NP  ondansetron (ZOFRAN) 4 MG tablet Take 1 tablet (4 mg total) by mouth every 8 (eight) hours as needed for nausea or vomiting. 03/27/20  Yes Hayden Rasmussen, MD  potassium chloride SA (KLOR-CON) 20 MEQ tablet Take 1 tablet (20 mEq total) by mouth daily. 05/17/20  Yes Verta Ellen., NP  torsemide (DEMADEX) 20 MG tablet Take 40 mg in the AM & Take 20 mg in the PM. 05/17/20  Yes Verta Ellen., NP  traMADol (ULTRAM) 50 MG tablet Take 50 mg by mouth every 6 (six) hours as needed. 06/29/20  Yes [provider]  ALPRAZolam Duanne Moron) 0.5 MG tablet Take 0.5 mg by mouth daily as needed. Patient not taking: No sig reported 07/11/20   [provider]  dicyclomine (BENTYL) 10 MG capsule Take 10 mg by mouth every 4 (four) hours as needed for spasms. Patient not taking: No sig reported    [provider]  diltiazem (CARDIZEM) 90 MG tablet Take 90 mg by mouth 2 (two) times daily. Patient not taking: No sig reported 07/10/20   [provider]  levothyroxine (SYNTHROID) 25 MCG tablet Take 25 mcg by mouth daily before breakfast. Patient not taking: No sig reported    [provider]  Multiple Vitamin (MULTIVITAMIN WITH MINERALS) TABS tablet Take 1 tablet by mouth daily. Patient not taking: No sig reported 03/19/19   Tat, Shanon Brow, MD  pregabalin (LYRICA) 25 MG capsule Take 1 capsule (25 mg total) by mouth 2 (two) times daily. Patient not taking: No sig reported  05/04/19   Orson Eva, MD     Allergies:     Allergies  Allergen Reactions  . Clonazepam Other (See Comments)    Pruritis  . Codeine Nausea And Vomiting and Other (See Comments)    Reaction unknown  . Sulfa Antibiotics Other (See Comments)    Reactions unknown     Physical Exam:   Vitals  Blood pressure (!) 152/95, pulse 75, temperature 97.8 F (36.6 C), resp. rate 19, height 5\' 4"  (1.626 m), weight 53.1 kg, SpO2 99 %.  1.  General: Patient lying supine in bed in no acute distress, appears stated age  7. Psychiatric: Flat affect, behavior normal for situation, alert to self, place, context, cooperative with exam  3. Neurologic: Face is symmetric, speech and language are normal, moves all 4 extremities voluntarily, mentating at baseline, no acute deficits on limited exam  4. HEENMT:  Head is without evidence of acute trauma, evidence of remote trauma/surgery to nose, normocephalic, pupils are reactive, neck is supple, trachea is midline  5. Respiratory : Lungs are clear to auscultation without wheezes, rhonchi, rales, no cyanosis, no clubbing, no increased work of breathing or accessory muscle use  6. Cardiovascular : Heart rate is normal, rhythm is irregularly irregular, no murmurs rubs or gallops, no peripheral edema, peripheral pulses palpated  7. Gastrointestinal:  Abdomen is soft, nondistended, nontender to palpation, bowel sounds active, no palpable masses  8. Skin:  Skin is warm dry and intact without acute lesion on limited exam  9.Musculoskeletal:  No calf tenderness, peripheral pulses palpated, no peripheral edema    Data Review:    CBC Recent Labs  Lab 07/25/20 1500  WBC 6.9  HGB 11.9*  HCT 37.7  PLT 326  MCV 96.7  MCH 30.5  MCHC 31.6  RDW 14.1  LYMPHSABS 1.5  MONOABS 0.7  EOSABS 0.1  BASOSABS 0.0   ------------------------------------------------------------------------------------------------------------------  Results for orders  placed or performed during the hospital encounter of 07/25/20 (from the past 48 hour(s))  Magnesium     Status: None   Collection Time: 07/25/20  3:00 PM  Result Value Ref Range   Magnesium 2.0 1.7 - 2.4 mg/dL    Comment: Performed at [facility], [address] [place], [place] [address]  Brain natriuretic peptide (order ONLY if patient c/o SOB)     Status: Abnormal   Collection Time: 07/25/20  3:00 PM  Result Value Ref Range   B Natriuretic Peptide 301.0 (H) 0.0 - 100.0 pg/mL    Comment: Performed at [facility], [address], [place], [place] [address]  Comprehensive metabolic panel     Status: Abnormal   Collection Time: 07/25/20  3:00 PM  Result Value Ref Range   Sodium 135 135 - 145 mmol/L   Potassium 4.1 3.5 - 5.1 mmol/L   Chloride 100 98 - 111 mmol/L   CO2 25 22 - 32 mmol/L   Glucose, Bld 106 (H) 70 - 99 mg/dL    Comment: Glucose reference range applies only to samples taken after fasting for at least 8 hours.   BUN 18 8 - 23 mg/dL   Creatinine, Ser 1.21 (H) 0.44 - 1.00 mg/dL   Calcium 8.9 8.9 - 10.3 mg/dL   Total Protein 7.9 6.5 - 8.1 g/dL   Albumin 4.1 3.5 - 5.0 g/dL   AST 27 15 - 41 U/L   ALT 19 0 - 44 U/L   Alkaline Phosphatase 82 38 - 126 U/L   Total Bilirubin 0.6 0.3 - 1.2 mg/dL   GFR, Estimated 43 (L) >60 mL/min    Comment: (NOTE) Calculated using the CKD-EPI Creatinine Equation (2021)    Anion gap 10 5 - 15    Comment: Performed at [facility], [address], [place], [place] [address]  Lipase, blood     Status: None   Collection Time: 07/25/20  3:00 PM  Result Value Ref Range   Lipase 25 11 - 51 U/L    Comment: Performed at [facility], [address], [place], [place] [address]  CBC with Differential/Platelet     Status: Abnormal   Collection Time: 07/25/20  3:00 PM  Result Value Ref Range   WBC 6.9 4.0 - 10.5 K/uL   RBC 3.90 3.87 - 5.11 MIL/uL   Hemoglobin 11.9 (L) 12.0 - 15.0 g/dL   HCT 37.7 36.0 - 46.0 %   MCV 96.7 80.0 - 100.0 fL   MCH 30.5  26.0 - 34.0 pg   MCHC 31.6 30.0 - 36.0 g/dL   RDW 14.1 11.5 - 15.5 %   Platelets 326 150 - 400 K/uL   nRBC 0.0 0.0 - 0.2 %   Neutrophils Relative % 67 %   Neutro Abs 4.6 1.7 - 7.7 K/uL   Lymphocytes Relative 21 %   Lymphs Abs 1.5 0.7 - 4.0 K/uL   Monocytes Relative 10 %   Monocytes Absolute 0.7 0.1 - 1.0 K/uL   Eosinophils Relative 2 %   Eosinophils Absolute 0.1 0.0 - 0.5 K/uL   Basophils Relative 0 %   Basophils Absolute 0.0 0.0 - 0.1 K/uL   Immature Granulocytes 0 %   Abs Immature Granulocytes 0.02 0.00 - 0.07 K/uL    Comment: Performed at  [address] [place], [place] [address]  Urinalysis, Routine w reflex microscopic Urine, Catheterized     Status: Abnormal   Collection Time: 07/25/20  4:52 PM  Result Value Ref Range   Color, Urine YELLOW YELLOW   APPearance CLEAR CLEAR   Specific Gravity, Urine 1.008 1.005 - 1.030   pH 6.0 5.0 - 8.0   Glucose, UA NEGATIVE NEGATIVE mg/dL   Hgb urine dipstick NEGATIVE NEGATIVE   Bilirubin Urine NEGATIVE NEGATIVE   Ketones, ur NEGATIVE NEGATIVE mg/dL   Protein, ur NEGATIVE NEGATIVE mg/dL   Nitrite NEGATIVE NEGATIVE   Leukocytes,Ua TRACE (A) NEGATIVE   RBC / HPF 0-5 0 - 5 RBC/hpf   WBC, UA 11-20 0 - 5 WBC/hpf   Bacteria, UA RARE (A) NONE SEEN   Squamous Epithelial / LPF 0-5 0 - 5   Mucus PRESENT     Comment: Performed at [facility], [address] [place], [place] [address]  Resp Panel by RT-PCR (Flu A&B, Covid) Nasopharyngeal Swab     Status: None   Collection Time: 07/25/20  5:39 PM   Specimen: Nasopharyngeal Swab; Nasopharyngeal(NP) swabs in vial transport medium  Result Value Ref Range   SARS Coronavirus 2 by RT PCR NEGATIVE NEGATIVE    Comment: (NOTE) SARS-CoV-2 target nucleic acids are NOT DETECTED.  The SARS-CoV-2 RNA is generally detectable in upper respiratory specimens during the acute phase of infection. The lowest concentration of SARS-CoV-2 viral copies this assay can detect is 138 copies/mL. A  negative result does not preclude SARS-Cov-2 infection and should not be used as the sole basis for treatment or other patient management decisions. A negative result may occur with  improper specimen collection/handling, submission of specimen other than nasopharyngeal swab, presence of viral mutation(s) within the areas targeted by this assay, and inadequate number of viral copies(<138 copies/mL). A negative result must be combined with clinical observations, patient history, and epidemiological information. The expected result is Negative.  Fact Sheet for Patients:  EntrepreneurPulse.com.au  Fact Sheet for Healthcare Providers:  IncredibleEmployment.be  This test is no t yet approved or cleared by the [place] FDA and  has been authorized for detection and/or diagnosis of SARS-CoV-2 by FDA under an Emergency Use Authorization (EUA). This EUA will remain  in effect (meaning this test can be used) for the duration of the COVID-19 declaration under Section 564(b)(1) of the Act, 21 U.S.C.section 360bbb-3(b)(1), unless the authorization is terminated  or revoked sooner.       Influenza A by PCR NEGATIVE NEGATIVE   Influenza B by PCR NEGATIVE NEGATIVE    Comment: (NOTE) The Xpert Xpress SARS-CoV-2/FLU/RSV plus assay is intended as an aid in the diagnosis of influenza from Nasopharyngeal swab specimens and should not be used as a sole basis for treatment. Nasal washings and aspirates are unacceptable for Xpert Xpress SARS-CoV-2/FLU/RSV testing.  Fact Sheet for Patients: EntrepreneurPulse.com.au  Fact Sheet for Healthcare Providers: IncredibleEmployment.be  This test is not yet approved or cleared by the [place] FDA and has been authorized for detection and/or diagnosis of SARS-CoV-2 by FDA under an Emergency Use Authorization (EUA). This EUA will remain in effect (meaning this test can be used)  for the duration of the COVID-19 declaration under Section 564(b)(1) of the Act, 21 U.S.C. section 360bbb-3(b)(1), unless the authorization is terminated or revoked.  Performed at [facility], [address] [place], [place] [address]     Chemistries  Recent Labs  Lab 07/25/20 1500  NA 135  K 4.1  CL 100  CO2 25  GLUCOSE 106*  BUN 18  CREATININE 1.21*  CALCIUM 8.9  MG 2.0  AST 27  ALT 19  ALKPHOS 82  BILITOT 0.6   ------------------------------------------------------------------------------------------------------------------  ------------------------------------------------------------------------------------------------------------------ GFR: Estimated Creatinine Clearance: 25.9 mL/min (A) (by C-G formula based on SCr of 1.21 mg/dL (H)). Liver Function Tests: Recent Labs  Lab 07/25/20 1500  AST 27  ALT 19  ALKPHOS 82  BILITOT 0.6  PROT 7.9  ALBUMIN 4.1   Recent Labs  Lab 07/25/20 1500  LIPASE 25   No results for input(s): AMMONIA in the last 168 hours. Coagulation Profile: No results for input(s): INR, PROTIME in the last 168 hours. Cardiac Enzymes: No results for input(s): CKTOTAL, CKMB, CKMBINDEX, TROPONINI in the last 168 hours. BNP (last 3 results) No results for input(s): PROBNP in the last 8760 hours. HbA1C: No results for input(s): HGBA1C in the last 72 hours. CBG: No results for input(s): GLUCAP in the last 168 hours. Lipid Profile: No results for input(s): CHOL, HDL, LDLCALC, TRIG, CHOLHDL, LDLDIRECT in the last 72 hours. Thyroid Function Tests: No results for input(s): TSH, T4TOTAL, FREET4, T3FREE, THYROIDAB in the last 72 hours. Anemia Panel: No results for input(s): VITAMINB12, FOLATE, FERRITIN, TIBC, IRON, RETICCTPCT in the last 72 hours.  --------------------------------------------------------------------------------------------------------------- Urine analysis:    Component Value Date/Time   COLORURINE YELLOW 07/25/2020 1652    APPEARANCEUR CLEAR 07/25/2020 1652   LABSPEC 1.008 07/25/2020 1652   PHURINE 6.0 07/25/2020 1652   GLUCOSEU NEGATIVE 07/25/2020 1652   HGBUR NEGATIVE 07/25/2020 1652   BILIRUBINUR NEGATIVE 07/25/2020 1652   KETONESUR NEGATIVE 07/25/2020 1652   PROTEINUR NEGATIVE 07/25/2020 1652   UROBILINOGEN 0.2 08/22/2014 1300   NITRITE NEGATIVE 07/25/2020 1652   LEUKOCYTESUR TRACE (A) 07/25/2020 1652      Imaging Results:    DG Chest Portable 1 View  Result Date: 07/25/2020 CLINICAL DATA:  87 year old female with shortness of breath. EXAM: PORTABLE CHEST 1 VIEW COMPARISON:  Chest radiograph dated 03/27/2020 and chest CT dated 04/26/2019. FINDINGS: Bibasilar densities, likely combination of small pleural effusions and associated atelectasis as seen on the prior CT. Pneumonia is not excluded clinical correlation is recommended. There is diffuse interstitial prominence which may be chronic although superimposed edema is not excluded. There is no pneumothorax. Mild cardiomegaly. Atherosclerotic calcification of the aorta. No acute osseous pathology. IMPRESSION: Bibasilar densities, likely combination of small pleural effusions and associated atelectasis. Pneumonia is not excluded. Electronically Signed   By: Anner Crete M.D.   On: 07/25/2020 16:04    My personal review of EKG: Rhythm A. fib, Rate 68/min, QTc 464 ,no Acute ST changes   Assessment & Plan:    Active Problems:   Hypertension   Acute diastolic congestive heart failure (HCC)   Chronic atrial fibrillation (HCC)   Thyroid disease   1. Acute diastolic congestive heart failure exacerbation 1. Early exacerbation with a BNP at 301 2. Chest x-ray shows small pleural effusions with atelectasis 3. Patient complains of shortness of breath 4. 60 Milligrams of Lasix given in the ED 5. Monitor daily weights and strict I's and O's 6. Low-sodium diet with fluid restriction 7. Echo in the a.m. 8. Likely discharge home in 24 to 48  hours 2. Atrial fibrillation 1. History of permanent A. Fib 2. Continue Cardizem and metoprolol 3. Not on anticoagulation at home 4. Continue to monitor 3. Thyroid disease 1. In the setting of acute CHF without other overt etiology, check TSH 2. Continue  Synthroid 3. Continue to monitor 4. Hypertension 1. Continue Cardizem 5.    DVT Prophylaxis-   Heparin- SCDs   AM Labs Ordered, also please review Full Orders  Family Communication: Admission, patients condition and plan of care including tests being ordered have been discussed with the patient and daughter who indicate understanding and agree with the plan and Code Status.  Code Status: DNR  Admission status: Observation Time spent in minutes : [place]

## 2020-07-25 NOTE — ED Triage Notes (Signed)
Pt presents to ED via [facility] for increased SOB, worse today, Sats 92-93% on RA. Pt c/o headache started 1 hour ago.

## 2020-07-26 ENCOUNTER — Observation Stay (HOSPITAL_BASED_OUTPATIENT_CLINIC_OR_DEPARTMENT_OTHER): Payer: Medicare Other

## 2020-07-26 DIAGNOSIS — I5031 Acute diastolic (congestive) heart failure: Secondary | ICD-10-CM

## 2020-07-26 DIAGNOSIS — I130 Hypertensive heart and chronic kidney disease with heart failure and stage 1 through stage 4 chronic kidney disease, or unspecified chronic kidney disease: Secondary | ICD-10-CM | POA: Diagnosis not present

## 2020-07-26 DIAGNOSIS — I10 Essential (primary) hypertension: Secondary | ICD-10-CM | POA: Diagnosis not present

## 2020-07-26 DIAGNOSIS — I482 Chronic atrial fibrillation, unspecified: Secondary | ICD-10-CM | POA: Diagnosis not present

## 2020-07-26 LAB — ECHOCARDIOGRAM COMPLETE
Area-P 1/2: 3.13 cm2
Height: 64 in
S' Lateral: 2.47 cm
Weight: 2010.6 oz

## 2020-07-26 LAB — BASIC METABOLIC PANEL
Anion gap: 11 (ref 5–15)
BUN: 17 mg/dL (ref 8–23)
CO2: 26 mmol/L (ref 22–32)
Calcium: 8.7 mg/dL — ABNORMAL LOW (ref 8.9–10.3)
Chloride: 104 mmol/L (ref 98–111)
Creatinine, Ser: 0.95 mg/dL (ref 0.44–1.00)
GFR, Estimated: 57 mL/min — ABNORMAL LOW (ref >60)
Glucose, Bld: 81 mg/dL (ref 70–99)
Potassium: 3.3 mmol/L — ABNORMAL LOW (ref 3.5–5.1)
Sodium: 141 mmol/L (ref 135–145)

## 2020-07-26 LAB — CBC WITH DIFFERENTIAL/PLATELET
Abs Immature Granulocytes: 0.01 10*3/uL (ref 0.00–0.07)
Basophils Absolute: 0 10*3/uL (ref 0.0–0.1)
Basophils Relative: 1 %
Eosinophils Absolute: 0.1 10*3/uL (ref 0.0–0.5)
Eosinophils Relative: 2 %
HCT: 34.8 % — ABNORMAL LOW (ref 36.0–46.0)
Hemoglobin: 10.9 g/dL — ABNORMAL LOW (ref 12.0–15.0)
Immature Granulocytes: 0 %
Lymphocytes Relative: 22 %
Lymphs Abs: 0.9 10*3/uL (ref 0.7–4.0)
MCH: 30.2 pg (ref 26.0–34.0)
MCHC: 31.3 g/dL (ref 30.0–36.0)
MCV: 96.4 fL (ref 80.0–100.0)
Monocytes Absolute: 0.5 10*3/uL (ref 0.1–1.0)
Monocytes Relative: 12 %
Neutro Abs: 2.8 10*3/uL (ref 1.7–7.7)
Neutrophils Relative %: 63 %
Platelets: 237 10*3/uL (ref 150–400)
RBC: 3.61 MIL/uL — ABNORMAL LOW (ref 3.87–5.11)
RDW: 13.7 % (ref 11.5–15.5)
WBC: 4.3 10*3/uL (ref 4.0–10.5)
nRBC: 0 % (ref 0.0–0.2)

## 2020-07-26 LAB — TSH: TSH: 0.372 u[IU]/mL (ref 0.350–4.500)

## 2020-07-26 LAB — MAGNESIUM: Magnesium: 2 mg/dL (ref 1.7–2.4)

## 2020-07-26 MED ORDER — TORSEMIDE 60 MG PO TABS
ORAL_TABLET | ORAL | 0 refills | Status: DC
Start: 2020-07-26 — End: 2020-08-04

## 2020-07-26 MED ORDER — POTASSIUM CHLORIDE CRYS ER 20 MEQ PO TBCR
40.0000 meq | EXTENDED_RELEASE_TABLET | Freq: Once | ORAL | Status: AC
  Administered 2020-07-26: 40 meq via ORAL
  Filled 2020-07-26: qty 2

## 2020-07-26 NOTE — Plan of Care (Signed)
  Problem: Acute Rehab PT Goals(only PT should resolve) Goal: Patient Will Transfer Sit To/From Stand Outcome: Progressing Flowsheets (Taken 07/26/2020 1413) Patient will transfer sit to/from stand: with supervision Goal: Pt Will Ambulate Outcome: Progressing Flowsheets (Taken 07/26/2020 1413) Pt will Ambulate:  75 feet  with least restrictive assistive device  with supervision Goal: Pt/caregiver will Perform Home Exercise Program Outcome: Progressing Flowsheets (Taken 07/26/2020 1413) Pt/caregiver will Perform Home Exercise Program:  For increased strengthening  For improved balance  Independently  2:14 PM, 07/26/20 Mearl Latin PT, DPT Physical Therapist at [facility]

## 2020-07-26 NOTE — Evaluation (Signed)
Physical Therapy Evaluation Patient Details Name: Renee Blake MRN: [id number] DOB: 1928-11-09 Today's Date: 07/26/2020   History of Present Illness  Philana Younis  is a 87 y.o. female, with history of vertigo, sliding hiatal hernia, hypertension, hyperlipidemia, chronic diarrhea, atrial fibrillation, and more presents the ED with chief complaint of dyspnea.  Patient reports that around 12 PM today she had gradual onset dyspnea that was associated with nausea.  Dyspnea has been worsening since it started.  She reports that she also had some back pain that she thinks is unrelated and she does have chronic back pain.  She denies any chest pain or palpitations.  She has as needed oxygen at home so she tried that and it did not improve her dyspnea.  She reports resting in her recliner secondary to orthopnea.  She is had intermittent swelling in her legs for the last week.  She does report that she takes a fluid pill regularly, and follows a low-sodium diet.  Patient denies any cough or fever associated with her dyspnea.  She denies any change in urine output.  She has had diarrhea but this is normal for her.  Patient reports that the time of my exam she is in no current pain.     Clinical Impression  Patient limited for functional mobility as stated below secondary to BLE weakness, fatigue and impaired standing balance. Patient does not require assist for bed mobility. She demonstrates good sitting tolerance and sitting balance EOB. She requires min guard/assist to transfer to standing and for minimal unsteadiness upon standing without use of AD. She ambulates without AD with slow cadence without AD. O2 sat monitored throughout session with a minimum in mid 90s without SOB. Patient limited by fatigue. Patient will benefit from continued physical therapy in hospital and recommended venue below to increase strength, balance, endurance for safe ADLs and gait.     Follow Up Recommendations Home health  PT    Equipment Recommendations  None recommended by PT    Recommendations for Other Services       Precautions / Restrictions Precautions Precautions: Fall Restrictions Weight Bearing Restrictions: No      Mobility  Bed Mobility Overal bed mobility: Modified Independent                  Transfers Overall transfer level: Needs assistance Equipment used: None Transfers: Sit to/from Stand Sit to Stand: Min guard;Min assist         General transfer comment: transfer to standing without AD, unsteady upon initial standing  Ambulation/Gait Ambulation/Gait assistance: Min guard Gait Distance (Feet): 50 Feet Assistive device: None Gait Pattern/deviations: Step-through pattern;Decreased step length - right;Decreased step length - left;Decreased stride length Gait velocity: decreased   General Gait Details: slow, labored gait without AD, minimally unsteady  Stairs            Wheelchair Mobility    Modified Rankin (Stroke Patients Only)       Balance Overall balance assessment: Needs assistance Sitting-balance support: No upper extremity supported Sitting balance-Leahy Scale: Normal Sitting balance - Comments: seated EOB   Standing balance support: No upper extremity supported Standing balance-Leahy Scale: Fair Standing balance comment: good/fair without AD                             Pertinent Vitals/Pain Pain Assessment: No/denies pain    Home Living Family/patient expects to be discharged to:: Private residence Living Arrangements: Alone Available  Help at Discharge: Family Type of Home: Apartment Home Access: Level entry     Home Layout: One level Home Equipment: Tira - 2 wheels;Cane - single point;Walker - 4 wheels;Shower seat;Bedside commode      Prior Function Level of Independence: Needs assistance   Gait / Transfers Assistance Needed: patient states household ambulator with AD PRN  ADL's / Homemaking Assistance  Needed: family assists PRN        Hand Dominance        Extremity/Trunk Assessment   Upper Extremity Assessment Upper Extremity Assessment: Generalized weakness    Lower Extremity Assessment Lower Extremity Assessment: Generalized weakness    Cervical / Trunk Assessment Cervical / Trunk Assessment: Kyphotic  Communication   Communication: No difficulties  Cognition Arousal/Alertness: Awake/alert Behavior During Therapy: WFL for tasks assessed/performed Overall Cognitive Status: Within Functional Limits for tasks assessed                                        General Comments      Exercises     Assessment/Plan    PT Assessment Patient needs continued PT services  PT Problem List Decreased strength;Decreased mobility;Decreased activity tolerance;Decreased balance       PT Treatment Interventions DME instruction;Therapeutic exercise;Gait training;Balance training;Stair training;Neuromuscular re-education;Functional mobility training;Therapeutic activities;Patient/family education    PT Goals (Current goals can be found in the Care Plan section)  Acute Rehab PT Goals Patient Stated Goal: Return home PT Goal Formulation: With patient Time For Goal Achievement: 08/02/20 Potential to Achieve Goals: Good    Frequency Min 3X/week   Barriers to discharge        Co-evaluation               AM-PAC PT "6 Clicks" Mobility  Outcome Measure Help needed turning from your back to your side while in a flat bed without using bedrails?: None Help needed moving from lying on your back to sitting on the side of a flat bed without using bedrails?: None Help needed moving to and from a bed to a chair (including a wheelchair)?: A Little Help needed standing up from a chair using your arms (e.g., wheelchair or bedside chair)?: A Little Help needed to walk in hospital room?: A Little Help needed climbing 3-5 steps with a railing? : A Lot 6 Click Score:  19    End of Session Equipment Utilized During Treatment: Gait belt Activity Tolerance: Patient tolerated treatment well;Patient limited by fatigue Patient left: in bed;with call bell/phone within reach;with bed alarm set Nurse Communication: Mobility status PT Visit Diagnosis: Unsteadiness on feet (R26.81);Other abnormalities of gait and mobility (R26.89);Muscle weakness (generalized) (M62.81)    Time: 9030-0923 PT Time Calculation (min) (ACUTE ONLY): 13 min   Charges:   PT Evaluation $PT Eval Moderate Complexity: 1 Mod          2:12 PM, 07/26/20 Mearl Latin PT, DPT Physical Therapist at [facility]

## 2020-07-26 NOTE — Progress Notes (Signed)
Titrated o2 down to 2 liters

## 2020-07-26 NOTE — Progress Notes (Signed)
  Echocardiogram 2D Echocardiogram has been performed.  Renee Blake 07/26/2020, 9:05 AM

## 2020-07-26 NOTE — TOC Transition Note (Signed)
Transition of Care [facility]) - CM/SW Discharge Note   Patient Details  Name: Renee Blake MRN: [id number] Date of Birth: [birth year]  [facility]) CM/SW Contact:  Shade Flood, LCSW Phone Number: 07/26/2020, 11:55 AM   Clinical Narrative:     Pt here observation status from home. Met with pt to review for heart failure screen. Pt has scale. Pt and family aware of heart healthy diet. Daughters assist pt every day as needed. Pt has no difficulty getting medications or getting to appointments.   Pt's daughter states pt recently discharged from [place] and PT. Daughter asking if pt can resume care with them. Discussed with MD who states he will order. Referred to [facility] at [place] and she will accept pt back on service.   Anticipating dc later today. No other TOC needs identified for dc.  Final next level of care: [place] Barriers to Discharge: Barriers Resolved   Patient Goals and CMS Choice Patient states their goals for this hospitalization and ongoing recovery are:: go home CMS Medicare.gov Compare Post Acute Care list provided to:: Patient Represenative (must comment) Choice offered to / list presented to : Adult Children  Discharge Placement                       Discharge Plan and Services In-house Referral: Clinical Social Work   Post Acute Care Choice: Home Health                    HH Arranged: RN,PT [place] Agency: [place] ([facility]) Date [place]: 07/26/20   Representative spoke with at [place]: [place] ([place]) Interventions     Readmission Risk Interventions Readmission Risk Prevention Plan 04/20/2019 03/15/2019 03/02/2019  Transportation Screening Complete Complete Complete  PCP or Specialist Appt within 5-7 Days - - -  PCP or Specialist Appt within 3-5 Days - - Complete  Home Care Screening - - -  Medication Review (RN CM) - - -  [place] or [place] -  Complete Complete  Social Work Consult for [place] Planning/Counseling - Complete Complete  Palliative Care Screening - Not Applicable Complete  Medication Review Press photographer) Complete Complete Complete  PCP or Specialist appointment within 3-5 days of discharge Not Complete - -  [place] or Home Care Consult Complete - -  SW Recovery Care/Counseling Consult Complete - -  Palliative Care Screening Not Complete - -  Skilled Nursing Facility Complete - -  Some recent data might be hidden

## 2020-07-26 NOTE — Discharge Instructions (Signed)
Heart Failure, Diagnosis  Heart failure means that your heart is not able to pump blood in the right way. This makes it hard for your body to work well. Heart failure is usually a long-term (chronic) condition. You must take good care of yourself and follow your treatment plan from your doctor. What are the causes?  High blood pressure.  Buildup of cholesterol and fat in the arteries.  Heart attack. This injures the heart muscle.  Heart valves that do not open and close properly.  Damage of the heart muscle. This is also called cardiomyopathy.  Infection of the heart muscle. This is also called myocarditis.  Lung disease. What increases the risk?  Getting older. The risk of heart failure goes up as a person ages.  Being overweight.  Being female.  Use tobacco or nicotine products.  Abusing alcohol or drugs.  Having taken medicines that can damage the heart.  Having any of these conditions: ? Diabetes. ? Abnormal heart rhythms. ? Thyroid problems. ? Low blood counts (anemia).  Having a family history of heart failure. What are the signs or symptoms?  Shortness of breath.  Coughing.  Swelling of the feet, ankles, legs, or belly.  Losing or gaining weight for no reason.  Trouble breathing.  Waking from sleep because of the need to sit up and get more air.  Fast heartbeat.  Being very tired.  Feeling dizzy, or feeling like you may pass out (faint).  Having no desire to eat.  Feeling like you may vomit (nauseous).  Peeing (urinating) more at night.  Feeling confused. How is this treated? This condition may be treated with:  Medicines. These can be given to treat blood pressure and to make the heart muscles stronger.  Changes in your daily life. These may include: ? Eating a healthy diet. ? Staying at a healthy body weight. ? Quitting tobacco, alcohol, and drug use. ? Doing exercises. ? Participating in a cardiac rehabilitation program. This program  helps you improve your health through exercise, education, and counseling.  Surgery. Surgery can be done to open blocked valves, or to put devices in the heart, such as pacemakers.  A donor heart (heart transplant). You will receive a healthy heart from a donor. Follow these instructions at home:  Treat other conditions as told by your doctor. These may include high blood pressure, diabetes, thyroid disease, or abnormal heart rhythms.  Learn as much as you can about heart failure.  Get support as you need it.  Keep all follow-up visits. Summary  Heart failure means that your heart is not able to pump blood in the right way.  This condition is often caused by high blood pressure, heart attack, or damage of the heart muscle.  Symptoms of this condition include shortness of breath and swelling of the feet, ankles, legs, or belly. You may also feel very tired or feel like you may vomit.  You may be treated with medicines, surgery, or changes in your daily life.  Treat other health conditions as told by your doctor. This information is not intended to replace advice given to you by your health care provider. Make sure you discuss any questions you have with your health care provider. Document Revised: 10/09/2019 Document Reviewed: 10/09/2019 Elsevier Patient Education  2021 [employer]  

## 2020-07-26 NOTE — Discharge Summary (Signed)
Physician Discharge Summary  Renee Blake CVE:[id number] DOB: October 25, 1928 DOA: 07/25/2020  PCP: [name], MD  Admit date: 07/25/2020 Discharge date: 07/26/2020  Admitted From: Home Disposition: Home  Recommendations for Outpatient Follow-up:  1. Follow up with PCP in 1-2 weeks 2. Please obtain BMP/CBC in one week 3. Follow up with cardiology scheduled for 08/04/2020 at 10:30 AM  Home Health:HHPT, RN Equipment/Devices:  Discharge Condition:stable CODE STATUS:DNR Diet recommendation: heart healthy  Brief/Interim Summary: 87 y/o female with history of diastolic CHF, atrial fibrillation, admitted to the hospital with decompensated CHF. She was diuresed with IV lasix and has had good urine output. Overall, she was able to achieve euvolemia. Daughter reports that patient has been compliant with medications, salt restriction and fluid intake.  Since she is improved with IV Lasix, her home dose of torsemide has been increased.  Outpatient follow-up with cardiology has been scheduled.  Currently she is able to ambulate on room air and does not have any hypoxia or shortness of breath.  Regarding atrial fibrillation, heart rate is currently controlled.  She is on Cardizem and metoprolol.  Family reports that she is not on anticoagulation due to history of bleeding issues in the past.  She was seen by physical therapy with recommendations for home health PT.  The remainder of her medical problems remained stable.  Discharge Diagnoses:  Active Problems:   Hypertension   Acute diastolic congestive heart failure (HCC)   Chronic atrial fibrillation (HCC)   Thyroid disease    Discharge Instructions  Discharge Instructions    Diet - low sodium heart healthy   Complete by: As directed    Increase activity slowly   Complete by: As directed      Allergies as of 07/26/2020      Reactions   Clonazepam Other (See Comments)   Pruritis   Codeine Nausea And Vomiting, Other (See Comments)    Reaction unknown   Sulfa Antibiotics Other (See Comments)   Reactions unknown      Medication List    STOP taking these medications   dicyclomine 10 MG capsule Commonly known as: BENTYL   multivitamin with minerals Tabs tablet   pregabalin 25 MG capsule Commonly known as: LYRICA     TAKE these medications   acetaminophen 325 MG tablet Commonly known as: TYLENOL Take 650 mg by mouth every 6 (six) hours as needed for mild pain or moderate pain.   ALPRAZolam 0.25 MG tablet Commonly known as: XANAX Take 1 tablet (0.25 mg total) by mouth 2 (two) times daily as needed. for anxiety What changed: Another medication with the same name was removed. Continue taking this medication, and follow the directions you see here.   diltiazem 90 MG 12 hr capsule Commonly known as: CARDIZEM SR Take 1 capsule (90 mg total) by mouth 2 (two) times daily. What changed: Another medication with the same name was removed. Continue taking this medication, and follow the directions you see here.   levothyroxine 50 MCG tablet Commonly known as: SYNTHROID Take 50 mcg by mouth daily. What changed: Another medication with the same name was removed. Continue taking this medication, and follow the directions you see here.   loperamide 2 MG tablet Commonly known as: IMODIUM A-D Take 2 mg by mouth daily as needed for diarrhea or loose stools.   meclizine 25 MG tablet Commonly known as: ANTIVERT Take 25 mg by mouth daily as needed for dizziness.   metoprolol succinate 25 MG 24 hr tablet Commonly  known as: TOPROL-XL Take 1 tablet (25 mg total) by mouth daily.   ondansetron 4 MG tablet Commonly known as: Zofran Take 1 tablet (4 mg total) by mouth every 8 (eight) hours as needed for nausea or vomiting.   potassium chloride SA 20 MEQ tablet Commonly known as: KLOR-CON Take 1 tablet (20 mEq total) by mouth daily.   Torsemide 60 MG Tabs Take 60 mg in the AM & Take 30 mg in the PM. What changed:    medication strength  additional instructions   traMADol 50 MG tablet Commonly known as: ULTRAM Take 50 mg by mouth every 6 (six) hours as needed.       Follow-up Information    [facility] Follow [facility].   Specialty: [place] Why: [place] staff will call your daughter to schedule in home visits             Allergies  Allergen Reactions  . Clonazepam Other (See Comments)    Pruritis  . Codeine Nausea And Vomiting and Other (See Comments)    Reaction unknown  . Sulfa Antibiotics Other (See Comments)    Reactions unknown    Consultations:     Procedures/Studies: DG Chest Portable 1 View  Result Date: 07/25/2020 CLINICAL DATA:  87 year old female with shortness of breath. EXAM: PORTABLE CHEST 1 VIEW COMPARISON:  Chest radiograph dated 03/27/2020 and chest CT dated 04/26/2019. FINDINGS: Bibasilar densities, likely combination of small pleural effusions and associated atelectasis as seen on the prior CT. Pneumonia is not excluded clinical correlation is recommended. There is diffuse interstitial prominence which may be chronic although superimposed edema is not excluded. There is no pneumothorax. Mild cardiomegaly. Atherosclerotic calcification of the aorta. No acute osseous pathology. IMPRESSION: Bibasilar densities, likely combination of small pleural effusions and associated atelectasis. Pneumonia is not excluded. Electronically Signed   By: Anner Crete M.D.   On: 07/25/2020 16:04   ECHOCARDIOGRAM COMPLETE  Result Date: 07/26/2020    ECHOCARDIOGRAM REPORT   Patient Name:   Renee Blake Date of Exam: 07/26/2020 Medical Rec #:  [id number]        Height:       64.0 in Accession #:    [id number]       Weight:       125.7 lb Date of Birth:  1928-05-12        BSA:          1.606 m Patient Age:    89 years         BP:           124/72 mmHg Patient Gender: F                HR:           55 bpm. Exam Location:  [facility] Procedure:  2D Echo, Cardiac Doppler and Color Doppler Indications:    CHF  History:        Patient has prior history of Echocardiogram examinations, most                 recent 01/28/2019. CHF, Arrythmias:Atrial Fibrillation,                 Signs/Symptoms:Dyspnea and LE edema; Risk Factors:Hypertension                 and Dyslipidemia.  Sonographer:    Dustin Flock RDCS Referring Phys: [id number] ASIA B [place]  1. Left ventricular ejection fraction, by  estimation, is 65 to 70%. The left ventricle has normal function. The left ventricle has no regional wall motion abnormalities. Left ventricular diastolic parameters are indeterminate.  2. Right ventricular systolic function is normal. The right ventricular size is normal. There is mildly elevated pulmonary artery systolic pressure. The estimated right ventricular systolic pressure is [id number] mmHg.  3. Left atrial size was upper normal.  4. Right atrial size was upper normal.  5. There is a trivial pericardial effusion posterior to the left ventricle.  6. The mitral valve is grossly normal. Mild to moderate mitral valve regurgitation.  7. Tricuspid valve regurgitation is mild to moderate.  8. The aortic valve is tricuspid. Aortic valve regurgitation is not visualized.  9. The inferior vena cava is normal in size with greater than 50% respiratory variability, suggesting right atrial pressure of 3 mmHg. FINDINGS  Left Ventricle: Left ventricular ejection fraction, by estimation, is 65 to 70%. The left ventricle has normal function. The left ventricle has no regional wall motion abnormalities. The left ventricular internal cavity size was normal in size. There is  no left ventricular hypertrophy. Left ventricular diastolic parameters are indeterminate. Right Ventricle: The right ventricular size is normal. No increase in right ventricular wall thickness. Right ventricular systolic function is normal. There is mildly elevated pulmonary artery systolic pressure. The  tricuspid regurgitant velocity is 3.09  m/s, and with an assumed right atrial pressure of 3 mmHg, the estimated right ventricular systolic pressure is [id number] mmHg. Left Atrium: Left atrial size was upper normal. Right Atrium: Right atrial size was upper normal. Pericardium: Trivial pericardial effusion is present. The pericardial effusion is posterior to the left ventricle. Mitral Valve: The mitral valve is grossly normal. There is mild thickening of the mitral valve leaflet(s). Mild to moderate mitral valve regurgitation. Tricuspid Valve: The tricuspid valve is grossly normal. Tricuspid valve regurgitation is mild to moderate. Aortic Valve: The aortic valve is tricuspid. There is mild aortic valve annular calcification. Aortic valve regurgitation is not visualized. Pulmonic Valve: The pulmonic valve was grossly normal. Pulmonic valve regurgitation is trivial. Aorta: The aortic root is normal in size and structure. Venous: The inferior vena cava is normal in size with greater than 50% respiratory variability, suggesting right atrial pressure of 3 mmHg. IAS/Shunts: No atrial level shunt detected by color flow Doppler.  LEFT VENTRICLE PLAX 2D LVIDd:         4.42 cm  Diastology LVIDs:         2.47 cm  LV e' medial:    6.09 cm/s LV PW:         1.08 cm  LV E/e' medial:  18.2 LV IVS:        0.96 cm  LV e' lateral:   9.03 cm/s LVOT diam:     2.00 cm  LV E/e' lateral: 12.3 LV SV:         48 LV SV Index:   30 LVOT Area:     3.14 cm  RIGHT VENTRICLE RV Basal diam:  3.72 cm RV S prime:     9.14 cm/s TAPSE (M-mode): 2.2 cm LEFT ATRIUM             Index       RIGHT ATRIUM           Index LA diam:        4.40 cm 2.74 cm/m  RA Area:     19.30 cm LA Vol (A2C):   51.4 ml 32.01 ml/m RA Volume:  56.20 ml  35.00 ml/m LA Vol (A4C):   51.4 ml 32.01 ml/m LA Biplane Vol: 54.6 ml 34.00 ml/m  AORTIC VALVE LVOT Vmax:   79.10 cm/s LVOT Vmean:  55.500 cm/s LVOT VTI:    0.152 m  AORTA Ao Root diam: 2.60 cm MITRAL VALVE                 TRICUSPID VALVE MV Area (PHT): 3.13 cm     TR Peak grad:   38.2 mmHg MV Decel Time: 242 msec     TR Vmax:        309.00 cm/s MV E velocity: 111.00 cm/s                             SHUNTS                             Systemic VTI:  0.15 m                             Systemic Diam: 2.00 cm Rozann Lesches MD Electronically signed by Rozann Lesches MD Signature Date/Time: 07/26/2020/9:50:11 AM    Final        Subjective: No shortness of breath, no chest pain  Discharge Exam: Vitals:   07/26/20 1005 07/26/20 1112 07/26/20 1114 07/26/20 1438  BP: 128/78 119/72 119/72 (!) 112/58  Pulse: 69  61 67  Resp: 18   12  Temp: 97.8 F (36.6 C)   97.9 F (36.6 C)  TempSrc: Oral   Oral  SpO2: 98%  100% 94%  Weight:      Height:        General: Pt is alert, awake, not in acute distress Cardiovascular: RRR, S1/S2 +, no rubs, no gallops Respiratory: CTA bilaterally, no wheezing, no rhonchi Abdominal: Soft, NT, ND, bowel sounds + Extremities: no edema, no cyanosis    The results of significant diagnostics from this hospitalization (including imaging, microbiology, ancillary and laboratory) are listed below for reference.     Microbiology: Recent Results (from the past 240 hour(s))  Resp Panel by RT-PCR (Flu A&B, Covid) Nasopharyngeal Swab     Status: None   Collection Time: 07/25/20  5:39 PM   Specimen: Nasopharyngeal Swab; Nasopharyngeal(NP) swabs in vial transport medium  Result Value Ref Range Status   SARS Coronavirus 2 by RT PCR NEGATIVE NEGATIVE Final    Comment: (NOTE) SARS-CoV-2 target nucleic acids are NOT DETECTED.  The SARS-CoV-2 RNA is generally detectable in upper respiratory specimens during the acute phase of infection. The lowest concentration of SARS-CoV-2 viral copies this assay can detect is 138 copies/mL. A negative result does not preclude SARS-Cov-2 infection and should not be used as the sole basis for treatment or other patient management decisions. A negative  result may occur with  improper specimen collection/handling, submission of specimen other than nasopharyngeal swab, presence of viral mutation(s) within the areas targeted by this assay, and inadequate number of viral copies(<138 copies/mL). A negative result must be combined with clinical observations, patient history, and epidemiological information. The expected result is Negative.  Fact Sheet for Patients:  EntrepreneurPulse.com.au  Fact Sheet for Healthcare Providers:  IncredibleEmployment.be  This test is no t yet approved or cleared by the [place] FDA and  has been authorized for detection and/or diagnosis of SARS-CoV-2 by FDA under an Emergency Use Authorization (  EUA). This EUA will remain  in effect (meaning this test can be used) for the duration of the COVID-19 declaration under Section 564(b)(1) of the Act, 21 U.S.C.section 360bbb-3(b)(1), unless the authorization is terminated  or revoked sooner.       Influenza A by PCR NEGATIVE NEGATIVE Final   Influenza B by PCR NEGATIVE NEGATIVE Final    Comment: (NOTE) The Xpert Xpress SARS-CoV-2/FLU/RSV plus assay is intended as an aid in the diagnosis of influenza from Nasopharyngeal swab specimens and should not be used as a sole basis for treatment. Nasal washings and aspirates are unacceptable for Xpert Xpress SARS-CoV-2/FLU/RSV testing.  Fact Sheet for Patients: EntrepreneurPulse.com.au  Fact Sheet for Healthcare Providers: IncredibleEmployment.be  This test is not yet approved or cleared by the [place] FDA and has been authorized for detection and/or diagnosis of SARS-CoV-2 by FDA under an Emergency Use Authorization (EUA). This EUA will remain in effect (meaning this test can be used) for the duration of the COVID-19 declaration under Section 564(b)(1) of the Act, 21 U.S.C. section 360bbb-3(b)(1), unless the authorization is  terminated or revoked.  Performed at [facility], [address] [place], [place] [address]      Labs: BNP (last 3 results) Recent Labs    07/25/20 1500  BNP 109.3*   Basic Metabolic Panel: Recent Labs  Lab 07/25/20 1500 07/26/20 0548  NA 135 141  K 4.1 3.3*  CL 100 104  CO2 25 26  GLUCOSE 106* 81  BUN 18 17  CREATININE 1.21* 0.95  CALCIUM 8.9 8.7*  MG 2.0 2.0   Liver Function Tests: Recent Labs  Lab 07/25/20 1500  AST 27  ALT 19  ALKPHOS 82  BILITOT 0.6  PROT 7.9  ALBUMIN 4.1   Recent Labs  Lab 07/25/20 1500  LIPASE 25   No results for input(s): AMMONIA in the last 168 hours. CBC: Recent Labs  Lab 07/25/20 1500 07/26/20 0548  WBC 6.9 4.3  NEUTROABS 4.6 2.8  HGB 11.9* 10.9*  HCT 37.7 34.8*  MCV 96.7 96.4  PLT 326 237   Cardiac Enzymes: No results for input(s): CKTOTAL, CKMB, CKMBINDEX, TROPONINI in the last 168 hours. BNP: Invalid input(s): POCBNP CBG: No results for input(s): GLUCAP in the last 168 hours. D-Dimer No results for input(s): DDIMER in the last 72 hours. Hgb A1c No results for input(s): HGBA1C in the last 72 hours. Lipid Profile No results for input(s): CHOL, HDL, LDLCALC, TRIG, CHOLHDL, LDLDIRECT in the last 72 hours. Thyroid function studies Recent Labs    07/26/20 0548  TSH 0.372   Anemia work up No results for input(s): VITAMINB12, FOLATE, FERRITIN, TIBC, IRON, RETICCTPCT in the last 72 hours. Urinalysis    Component Value Date/Time   COLORURINE YELLOW 07/25/2020 1652   APPEARANCEUR CLEAR 07/25/2020 1652   LABSPEC 1.008 07/25/2020 1652   PHURINE 6.0 07/25/2020 1652   GLUCOSEU NEGATIVE 07/25/2020 1652   HGBUR NEGATIVE 07/25/2020 [place] 07/25/2020 1652   [place] 07/25/2020 1652   PROTEINUR NEGATIVE 07/25/2020 1652   UROBILINOGEN 0.2 08/22/2014 1300   NITRITE NEGATIVE 07/25/2020 1652   LEUKOCYTESUR TRACE (A) 07/25/2020 1652   Sepsis Labs Invalid input(s): PROCALCITONIN,  WBC,   LACTICIDVEN Microbiology Recent Results (from the past 240 hour(s))  Resp Panel by RT-PCR (Flu A&B, Covid) Nasopharyngeal Swab     Status: None   Collection Time: 07/25/20  5:39 PM   Specimen: Nasopharyngeal Swab; Nasopharyngeal(NP) swabs in vial transport medium  Result Value Ref  Range Status   SARS Coronavirus 2 by RT PCR NEGATIVE NEGATIVE Final    Comment: (NOTE) SARS-CoV-2 target nucleic acids are NOT DETECTED.  The SARS-CoV-2 RNA is generally detectable in upper respiratory specimens during the acute phase of infection. The lowest concentration of SARS-CoV-2 viral copies this assay can detect is 138 copies/mL. A negative result does not preclude SARS-Cov-2 infection and should not be used as the sole basis for treatment or other patient management decisions. A negative result may occur with  improper specimen collection/handling, submission of specimen other than nasopharyngeal swab, presence of viral mutation(s) within the areas targeted by this assay, and inadequate number of viral copies(<138 copies/mL). A negative result must be combined with clinical observations, patient history, and epidemiological information. The expected result is Negative.  Fact Sheet for Patients:  EntrepreneurPulse.com.au  Fact Sheet for Healthcare Providers:  IncredibleEmployment.be  This test is no t yet approved or cleared by the [place] FDA and  has been authorized for detection and/or diagnosis of SARS-CoV-2 by FDA under an Emergency Use Authorization (EUA). This EUA will remain  in effect (meaning this test can be used) for the duration of the COVID-19 declaration under Section 564(b)(1) of the Act, 21 U.S.C.section 360bbb-3(b)(1), unless the authorization is terminated  or revoked sooner.       Influenza A by PCR NEGATIVE NEGATIVE Final   Influenza B by PCR NEGATIVE NEGATIVE Final    Comment: (NOTE) The Xpert Xpress SARS-CoV-2/FLU/RSV plus  assay is intended as an aid in the diagnosis of influenza from Nasopharyngeal swab specimens and should not be used as a sole basis for treatment. Nasal washings and aspirates are unacceptable for Xpert Xpress SARS-CoV-2/FLU/RSV testing.  Fact Sheet for Patients: EntrepreneurPulse.com.au  Fact Sheet for Healthcare Providers: IncredibleEmployment.be  This test is not yet approved or cleared by the [place] FDA and has been authorized for detection and/or diagnosis of SARS-CoV-2 by FDA under an Emergency Use Authorization (EUA). This EUA will remain in effect (meaning this test can be used) for the duration of the COVID-19 declaration under Section 564(b)(1) of the Act, 21 U.S.C. section 360bbb-3(b)(1), unless the authorization is terminated or revoked.  Performed at [facility], [address] [place], [place] [address]      Time coordinating discharge: 15mins  SIGNED:   Kathie Dike, MD  [facility] 07/26/2020, 2:43 PM   If 7PM-7AM, please contact night-coverage [email or web address]

## 2020-07-27 ENCOUNTER — Ambulatory Visit: Payer: Medicare Other | Admitting: Family Medicine

## 2020-07-27 DIAGNOSIS — R42 Dizziness and giddiness: Secondary | ICD-10-CM | POA: Diagnosis not present

## 2020-07-27 DIAGNOSIS — I482 Chronic atrial fibrillation, unspecified: Secondary | ICD-10-CM | POA: Diagnosis not present

## 2020-07-27 DIAGNOSIS — K52839 Microscopic colitis, unspecified: Secondary | ICD-10-CM | POA: Diagnosis not present

## 2020-07-27 DIAGNOSIS — I5031 Acute diastolic (congestive) heart failure: Secondary | ICD-10-CM | POA: Diagnosis not present

## 2020-07-27 DIAGNOSIS — E079 Disorder of thyroid, unspecified: Secondary | ICD-10-CM | POA: Diagnosis not present

## 2020-07-27 DIAGNOSIS — D62 Acute posthemorrhagic anemia: Secondary | ICD-10-CM | POA: Diagnosis not present

## 2020-07-27 DIAGNOSIS — G8929 Other chronic pain: Secondary | ICD-10-CM | POA: Diagnosis not present

## 2020-07-27 DIAGNOSIS — K449 Diaphragmatic hernia without obstruction or gangrene: Secondary | ICD-10-CM | POA: Diagnosis not present

## 2020-07-27 DIAGNOSIS — I110 Hypertensive heart disease with heart failure: Secondary | ICD-10-CM | POA: Diagnosis not present

## 2020-07-27 DIAGNOSIS — K222 Esophageal obstruction: Secondary | ICD-10-CM | POA: Diagnosis not present

## 2020-07-27 DIAGNOSIS — M549 Dorsalgia, unspecified: Secondary | ICD-10-CM | POA: Diagnosis not present

## 2020-07-27 DIAGNOSIS — E785 Hyperlipidemia, unspecified: Secondary | ICD-10-CM | POA: Diagnosis not present

## 2020-07-28 DIAGNOSIS — G8929 Other chronic pain: Secondary | ICD-10-CM | POA: Diagnosis not present

## 2020-07-28 DIAGNOSIS — I5031 Acute diastolic (congestive) heart failure: Secondary | ICD-10-CM | POA: Diagnosis not present

## 2020-07-28 DIAGNOSIS — I482 Chronic atrial fibrillation, unspecified: Secondary | ICD-10-CM | POA: Diagnosis not present

## 2020-07-28 DIAGNOSIS — E785 Hyperlipidemia, unspecified: Secondary | ICD-10-CM | POA: Diagnosis not present

## 2020-07-28 DIAGNOSIS — K222 Esophageal obstruction: Secondary | ICD-10-CM | POA: Diagnosis not present

## 2020-07-28 DIAGNOSIS — M549 Dorsalgia, unspecified: Secondary | ICD-10-CM | POA: Diagnosis not present

## 2020-07-28 DIAGNOSIS — I110 Hypertensive heart disease with heart failure: Secondary | ICD-10-CM | POA: Diagnosis not present

## 2020-07-28 DIAGNOSIS — R42 Dizziness and giddiness: Secondary | ICD-10-CM | POA: Diagnosis not present

## 2020-07-28 DIAGNOSIS — D62 Acute posthemorrhagic anemia: Secondary | ICD-10-CM | POA: Diagnosis not present

## 2020-07-28 DIAGNOSIS — E079 Disorder of thyroid, unspecified: Secondary | ICD-10-CM | POA: Diagnosis not present

## 2020-07-28 DIAGNOSIS — K52839 Microscopic colitis, unspecified: Secondary | ICD-10-CM | POA: Diagnosis not present

## 2020-07-28 DIAGNOSIS — K449 Diaphragmatic hernia without obstruction or gangrene: Secondary | ICD-10-CM | POA: Diagnosis not present

## 2020-07-29 ENCOUNTER — Emergency Department (HOSPITAL_COMMUNITY): Payer: Medicare Other

## 2020-07-29 ENCOUNTER — Other Ambulatory Visit: Payer: Self-pay

## 2020-07-29 ENCOUNTER — Emergency Department (HOSPITAL_COMMUNITY)
Admission: EM | Admit: 2020-07-29 | Discharge: 2020-07-29 | Disposition: A | Discharge status: Home / Self Care | Payer: Medicare Other | Attending: Emergency Medicine | Admitting: Emergency Medicine

## 2020-07-29 ENCOUNTER — Encounter (HOSPITAL_COMMUNITY): Payer: Self-pay

## 2020-07-29 DIAGNOSIS — R1084 Generalized abdominal pain: Secondary | ICD-10-CM | POA: Diagnosis not present

## 2020-07-29 DIAGNOSIS — I110 Hypertensive heart disease with heart failure: Secondary | ICD-10-CM | POA: Diagnosis not present

## 2020-07-29 DIAGNOSIS — E119 Type 2 diabetes mellitus without complications: Secondary | ICD-10-CM | POA: Insufficient documentation

## 2020-07-29 DIAGNOSIS — E871 Hypo-osmolality and hyponatremia: Secondary | ICD-10-CM

## 2020-07-29 DIAGNOSIS — Z85828 Personal history of other malignant neoplasm of skin: Secondary | ICD-10-CM | POA: Insufficient documentation

## 2020-07-29 DIAGNOSIS — R339 Retention of urine, unspecified: Secondary | ICD-10-CM | POA: Diagnosis not present

## 2020-07-29 DIAGNOSIS — R5381 Other malaise: Secondary | ICD-10-CM | POA: Diagnosis not present

## 2020-07-29 DIAGNOSIS — N3000 Acute cystitis without hematuria: Secondary | ICD-10-CM | POA: Insufficient documentation

## 2020-07-29 DIAGNOSIS — R300 Dysuria: Secondary | ICD-10-CM | POA: Diagnosis not present

## 2020-07-29 DIAGNOSIS — R0902 Hypoxemia: Secondary | ICD-10-CM | POA: Diagnosis not present

## 2020-07-29 DIAGNOSIS — J9621 Acute and chronic respiratory failure with hypoxia: Secondary | ICD-10-CM | POA: Diagnosis not present

## 2020-07-29 DIAGNOSIS — N200 Calculus of kidney: Secondary | ICD-10-CM | POA: Diagnosis not present

## 2020-07-29 DIAGNOSIS — I5033 Acute on chronic diastolic (congestive) heart failure: Secondary | ICD-10-CM | POA: Insufficient documentation

## 2020-07-29 DIAGNOSIS — R1030 Lower abdominal pain, unspecified: Secondary | ICD-10-CM | POA: Diagnosis not present

## 2020-07-29 DIAGNOSIS — I700 Atherosclerosis of aorta: Secondary | ICD-10-CM | POA: Diagnosis not present

## 2020-07-29 DIAGNOSIS — N281 Cyst of kidney, acquired: Secondary | ICD-10-CM | POA: Diagnosis not present

## 2020-07-29 LAB — CBC WITH DIFFERENTIAL/PLATELET
Abs Immature Granulocytes: 0.01 10*3/uL (ref 0.00–0.07)
Basophils Absolute: 0 10*3/uL (ref 0.0–0.1)
Basophils Relative: 1 %
Eosinophils Absolute: 0.1 10*3/uL (ref 0.0–0.5)
Eosinophils Relative: 2 %
HCT: 35.9 % — ABNORMAL LOW (ref 36.0–46.0)
Hemoglobin: 11.2 g/dL — ABNORMAL LOW (ref 12.0–15.0)
Immature Granulocytes: 0 %
Lymphocytes Relative: 31 %
Lymphs Abs: 1.5 10*3/uL (ref 0.7–4.0)
MCH: 30.2 pg (ref 26.0–34.0)
MCHC: 31.2 g/dL (ref 30.0–36.0)
MCV: 96.8 fL (ref 80.0–100.0)
Monocytes Absolute: 0.5 10*3/uL (ref 0.1–1.0)
Monocytes Relative: 11 %
Neutro Abs: 2.6 10*3/uL (ref 1.7–7.7)
Neutrophils Relative %: 55 %
Platelets: 229 10*3/uL (ref 150–400)
RBC: 3.71 MIL/uL — ABNORMAL LOW (ref 3.87–5.11)
RDW: 13.7 % (ref 11.5–15.5)
WBC: 4.8 10*3/uL (ref 4.0–10.5)
nRBC: 0 % (ref 0.0–0.2)

## 2020-07-29 LAB — URINALYSIS, ROUTINE W REFLEX MICROSCOPIC
Bilirubin Urine: NEGATIVE
Glucose, UA: NEGATIVE mg/dL
Hgb urine dipstick: NEGATIVE
Ketones, ur: NEGATIVE mg/dL
Nitrite: POSITIVE — AB
Protein, ur: NEGATIVE mg/dL
Specific Gravity, Urine: 1.004 — ABNORMAL LOW (ref 1.005–1.030)
pH: 6 (ref 5.0–8.0)

## 2020-07-29 LAB — COMPREHENSIVE METABOLIC PANEL
ALT: 13 U/L (ref 0–44)
AST: 16 U/L (ref 15–41)
Albumin: 4 g/dL (ref 3.5–5.0)
Alkaline Phosphatase: 75 U/L (ref 38–126)
Anion gap: 8 (ref 5–15)
BUN: 18 mg/dL (ref 8–23)
CO2: 25 mmol/L (ref 22–32)
Calcium: 8.7 mg/dL — ABNORMAL LOW (ref 8.9–10.3)
Chloride: 97 mmol/L — ABNORMAL LOW (ref 98–111)
Creatinine, Ser: 1.49 mg/dL — ABNORMAL HIGH (ref 0.44–1.00)
GFR, Estimated: 33 mL/min — ABNORMAL LOW (ref >60)
Glucose, Bld: 100 mg/dL — ABNORMAL HIGH (ref 70–99)
Potassium: 3.4 mmol/L — ABNORMAL LOW (ref 3.5–5.1)
Sodium: 130 mmol/L — ABNORMAL LOW (ref 135–145)
Total Bilirubin: 0.3 mg/dL (ref 0.3–1.2)
Total Protein: 7.6 g/dL (ref 6.5–8.1)

## 2020-07-29 LAB — LIPASE, BLOOD: Lipase: 42 U/L (ref 11–51)

## 2020-07-29 IMAGING — CT CT RENAL STONE PROTOCOL
2 of 4 series · 16 of 46 positions shown, 18 images · non-contrast
Comparison: [DATE]

CLINICAL DATA: Dysuria for 2 days, flank pain, history of anal
cancer

EXAM:
CT ABDOMEN AND PELVIS WITHOUT CONTRAST
TECHNIQUE: Multidetector CT imaging of the abdomen and pelvis was performed
following the standard protocol without IV contrast.

[Series 2: axial st · axial · 0.88mm/px · z∈[+856,+1226]mm · 13 of 86 slices shown, 15 images]
[im 6/86  soft-tissue]
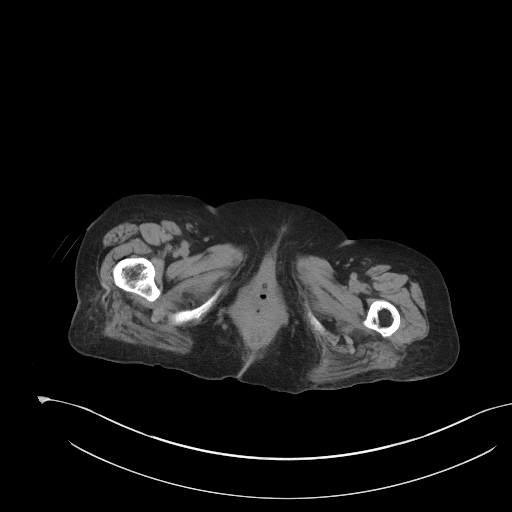
[im 6/86  bone]
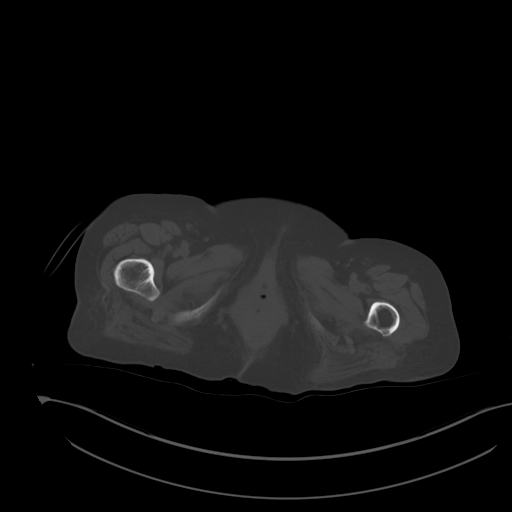
[im 12/86  soft-tissue]
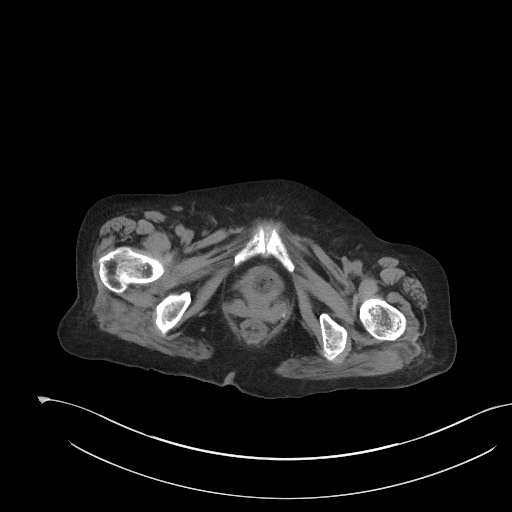
[im 18/86  soft-tissue]
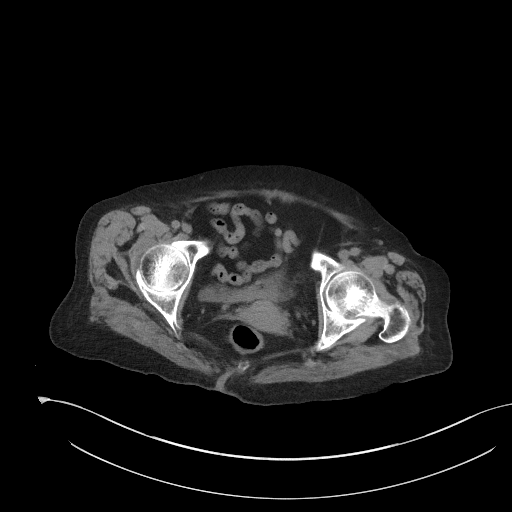
[im 23/86  soft-tissue]
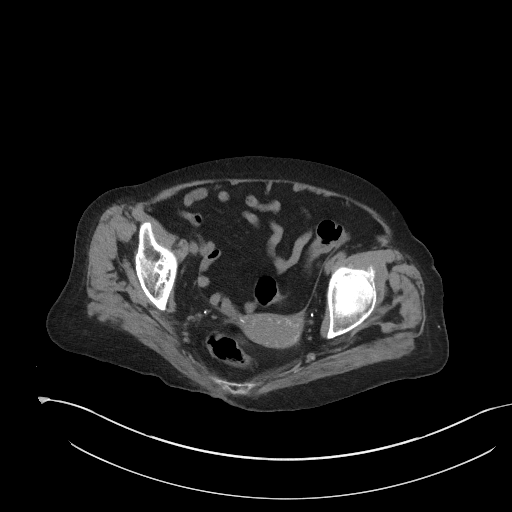
[im 29/86  soft-tissue]
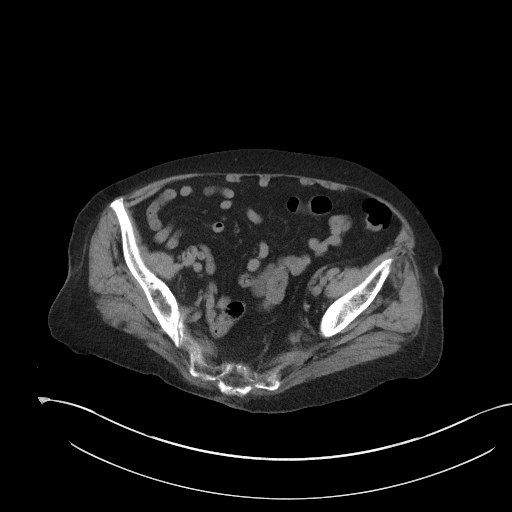
[im 35/86  soft-tissue]
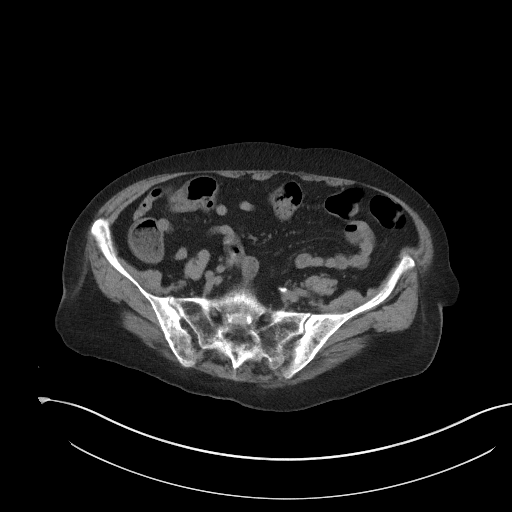
[im 46/86  soft-tissue]
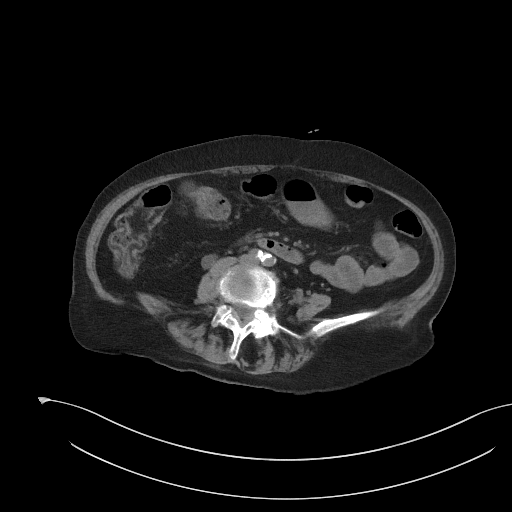
[im 52/86  soft-tissue]
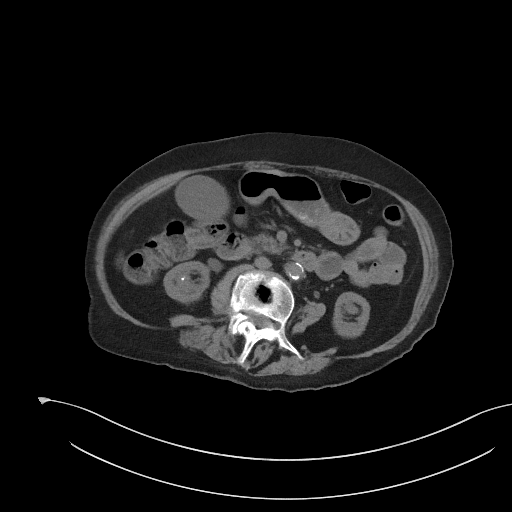
[im 57/86  soft-tissue]
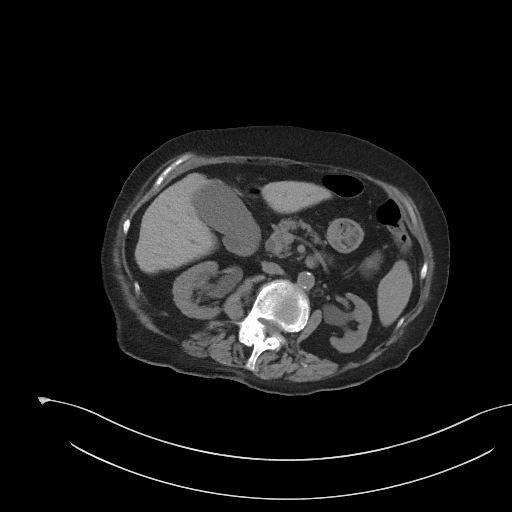
[im 57/86  bone]
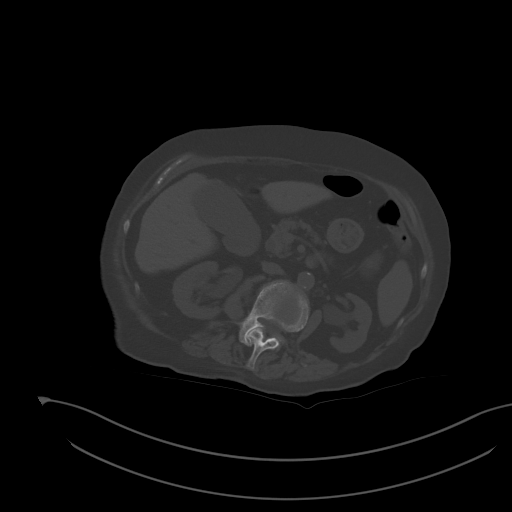
[im 63/86  soft-tissue]
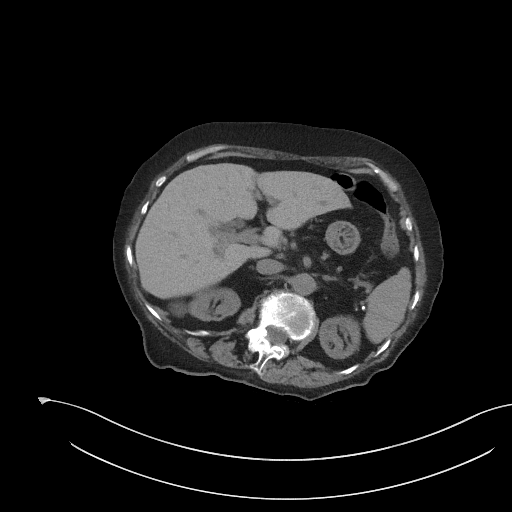
[im 69/86  soft-tissue]
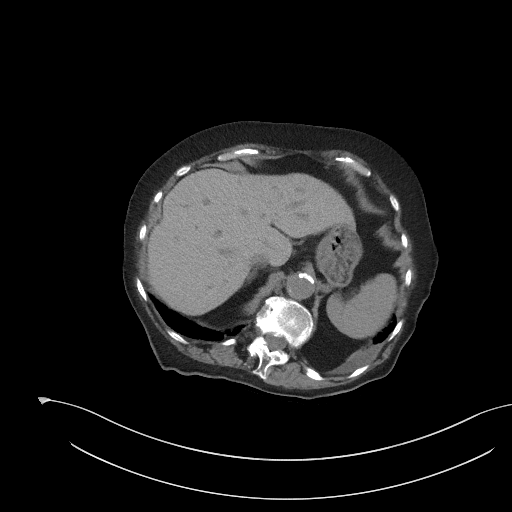
[im 74/86  soft-tissue]
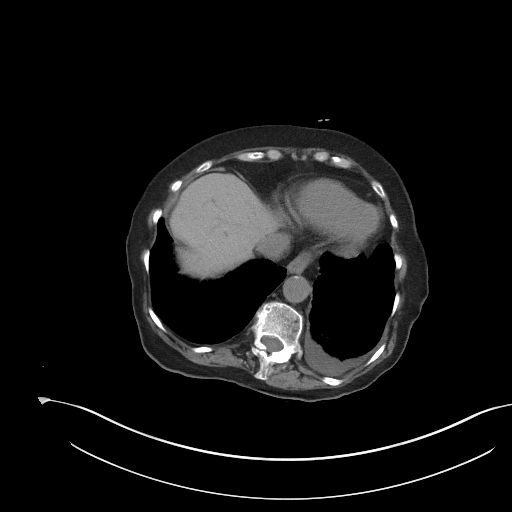
[im 80/86  soft-tissue]
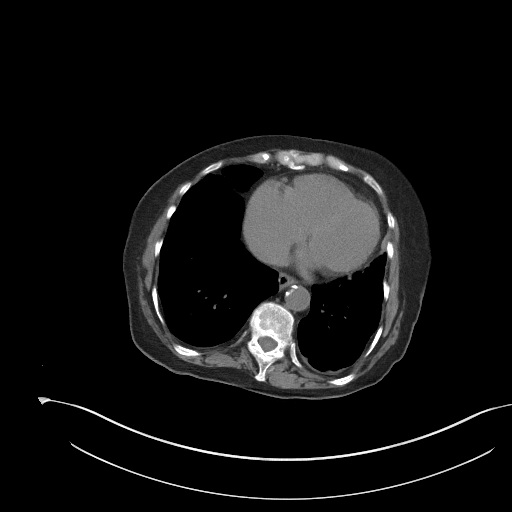

[Series 5: coronal st · coronal · 0.77mm/px · 3 of 104 slices shown]
[im 35/104  soft-tissue]
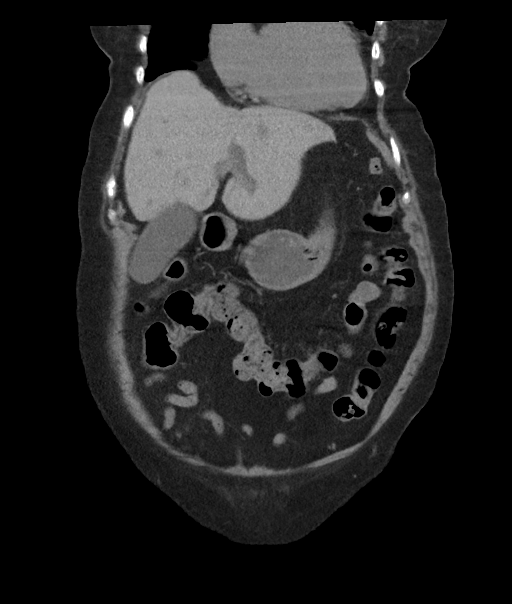
[im 46/104  soft-tissue]
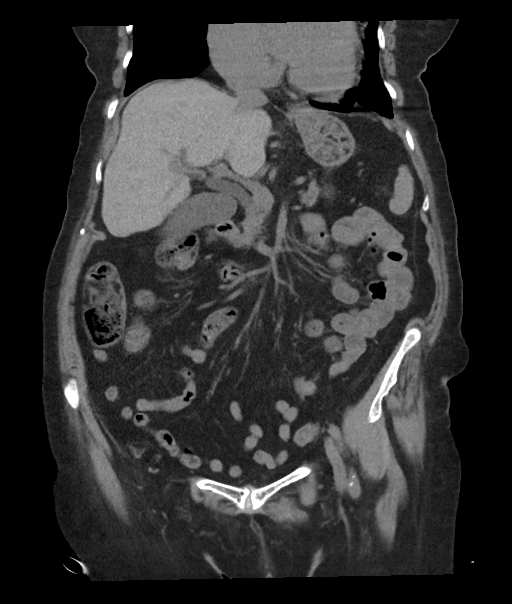
[im 58/104  soft-tissue]
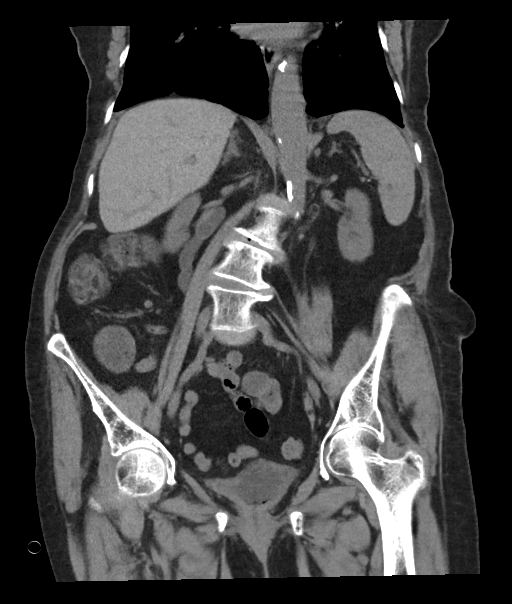

[16 of 46 positions shown; findings below may reference images not displayed]

FINDINGS: Lower chest: Trace left pleural effusion unchanged since prior exam.
No acute airspace disease.

Hepatobiliary: No focal liver abnormality is seen. No gallstones,
gallbladder wall thickening, or biliary dilatation.

Pancreas: Unremarkable. No pancreatic ductal dilatation or
surrounding inflammatory changes.

Spleen: Normal in size without focal abnormality.

Adrenals/Urinary Tract: There is a stable right renal cortical cyst.
2 mm nonobstructing calculus lower pole right kidney unchanged.
Bilateral extrarenal pelves are seen, right greater than left. No
hydronephrosis.

The bladder is decompressed with a Foley catheter in place. The
adrenals are unremarkable.

Stomach/Bowel: No bowel obstruction or ileus. Normal gas-filled
appendix right lower quadrant. No inflammatory changes. Wall
thickening at the anal verge may be post therapeutic, and is stable
since prior study.

Vascular/Lymphatic: Aortic atherosclerosis. No enlarged abdominal or
pelvic lymph nodes.

Reproductive: Uterus and bilateral adnexa are unremarkable.

Other: No free fluid or free gas.  No abdominal wall hernia.

Musculoskeletal: No acute or destructive bony lesions. Reconstructed
images demonstrate no additional findings.
IMPRESSION: 1. Stable 2 mm nonobstructing right renal calculus.
2. Stable wall thickening at the anal verge, likely post therapeutic
changes in this patient with history of anal cancer.
3. Stable trace left pleural effusion.
4.  Aortic Atherosclerosis ([OT]-[OT]).

## 2020-07-29 MED ORDER — CEPHALEXIN 500 MG PO CAPS: 500.0000 mg | ORAL_CAPSULE | Freq: Three times a day (TID) | ORAL | 0 refills | Status: AC

## 2020-07-29 MED ORDER — SODIUM CHLORIDE 0.9 % IV SOLN
1.0000 g | Freq: Once | INTRAVENOUS | Status: AC
  Administered 2020-07-29: 1 g via INTRAVENOUS
  Filled 2020-07-29: qty 10

## 2020-07-29 NOTE — ED Triage Notes (Signed)
Pt to er via ems, per ems pt was seen here on the 26th and d/c on the 27th, states that she was given torsemide, states that she hasn't taken the medication and she is here today because she is having a hard time urinating.  Pt states that she is here because she can't urinate.

## 2020-07-29 NOTE — Discharge Instructions (Signed)
You were seen in the emergency department today with trouble getting urine out.  We had to place a Foley catheter which will stay in place for at least the next week.  Please call the urology office on Monday to schedule a follow-up appointment where they will remove the catheter to see if you can urinate on your own.  I am treating you with antibiotics for a possible infection in the urine.  You can continue your other home medications as prescribed.  If you develop worsening symptoms such as fever, worsening pain, inability to urinate into the bag please return to the emergency department.  We will provide instruction on how to properly drain the urine bag prior to going home.

## 2020-07-29 NOTE — ED Provider Notes (Signed)
Emergency Department Provider Note   I have reviewed the triage vital signs and the nursing notes.   HISTORY  Chief Complaint Urinary Retention   HPI Renee Blake is a 87 y.o. female with PMH reviewed below including recent admit for CHF exacerbation and diuresis currently on Torsemide presents to the emergency department with inability to urinate.  She notes that she last urinated yesterday.  She has been taking her home medications including torsemide.  She is having some mild lower abdominal discomfort but nothing severe.  She does not feel the urge to urinate.  She is not having fevers or chills.  She denies chest pain or shortness of breath.  She does not feel like she has built up fluid in the legs. No radiation of symptoms or modifying factors.    Past Medical History:  Diagnosis Date  . Acute blood loss anemia   . Atrial fibrillation ([place])    a. diagnosed in 12/2018. Rate-control pursued given not a candidate for anticoagulation  . Chronic diarrhea   . Dilation of biliary tract   . Duodenal stricture   . Esophageal stricture   . Gastric ulcer   . GI bleed   . Hyperlipemia   . Hypertension   . Microscopic colitis   . Nephrolithiasis   . Pancreatic duct dilated   . Schatzki's ring   . Skin cancer   . Sliding hiatal hernia   . Vertigo     Patient Active Problem List   Diagnosis Date Noted  . Thyroid disease 07/25/2020  . Acute pulmonary edema (HCC)   . Longstanding persistent atrial fibrillation ([place])   . Palliative care by specialist   . Encounter for hospice care discussion   . Protein-calorie malnutrition, severe 04/27/2019  . Acute on chronic diastolic (congestive) heart failure ([place]) 04/27/2019  . Normocytic anemia 04/26/2019  . Goals of care, counseling/discussion   . Palliative care encounter   . Hypotension 04/17/2019  . Debility 03/16/2019  . Shortness of breath 03/16/2019  . Cachexia ([place]) 03/16/2019  . Permanent atrial fibrillation ([place])    . Hypoxia 03/15/2019  . CHF exacerbation ([place]) 03/14/2019  . Bilateral pleural effusion 03/04/2019  . Chronic atrial fibrillation ([place]) 03/04/2019  . DNR (do not resuscitate) 03/04/2019  . Acute on chronic diastolic congestive heart failure ([place]) 03/02/2019  . Chronic diastolic heart failure ([place]) 03/02/2019  . Chronic renal failure, stage 3b ([place]) 03/02/2019  . Acute respiratory failure with hypoxia ([place]) 03/02/2019  . Acute exacerbation of CHF (congestive heart failure) ([place]) 01/27/2019  . Atrial fibrillation with RVR ([place]) 01/27/2019  . Iron deficiency anemia due to chronic blood loss---H/o Gi Bleed 01/27/2019  . Vaginal mass 07/29/2017  . Heme positive stool 07/25/2017  . Cellulitis 12/10/2016  . Facial cellulitis   . Erroneous encounter - disregard 03/17/2015  . AKI (acute kidney injury) ([place])   . Metabolic acidosis 0000000  . Acute renal failure syndrome ([place])   . Renal failure 08/22/2014  . Hyperkalemia 08/22/2014  . UTI (urinary tract infection) 08/22/2014  . Acute renal failure ([place]) 08/22/2014  . Dyspnea 03/15/2014  . Type 2 diabetes mellitus ([place]) 06/08/2013  . Acute diastolic congestive heart failure ([place]) 06/07/2013  . Syncope 03/26/2013  . Rectal bleeding 03/26/2013  . Dehydration 03/26/2013  . Generalized weakness 03/26/2013  . Malnutrition of moderate degree ([place]) 03/01/2013  . Diarrhea 02/28/2013  . Loss of weight 02/28/2013  . H/o Prior duodenal ulcer with hemorrhage 02/27/2013  . H/o Prior Lower GI  bleed (anorectal Ca) 02/26/2013  . Acute blood loss anemia 02/26/2013  . UTI (lower urinary tract infection) 02/26/2013  . Hypertension 02/26/2013    Past Surgical History:  Procedure Laterality Date  . BIOPSY  07/28/2017   Procedure: BIOPSY;  Surgeon: Rogene Houston, MD;  Location[facility];  Service: Endoscopy;;  rectum  . COLONOSCOPY N/A 07/28/2017   Procedure: COLONOSCOPY;  Surgeon: Rogene Houston, MD;  Location: [facility];  Service:  Endoscopy;  Laterality: N/A;  . ESOPHAGOGASTRODUODENOSCOPY N/A 02/27/2013   Procedure: ESOPHAGOGASTRODUODENOSCOPY (EGD);  Surgeon: Ladene Artist, MD;  Location: [facility] ENDOSCOPY;  Service: Endoscopy;  Laterality: N/A;  . ESOPHAGOGASTRODUODENOSCOPY N/A 03/28/2013   Procedure: ESOPHAGOGASTRODUODENOSCOPY (EGD);  Surgeon: Rogene Houston, MD;  Location: [facility];  Service: Endoscopy;  Laterality: N/A;  . HOT HEMOSTASIS N/A 02/27/2013   Procedure: HOT HEMOSTASIS (ARGON PLASMA COAGULATION/BICAP);  Surgeon: Ladene Artist, MD;  Location[facility] ENDOSCOPY;  Service: Endoscopy;  Laterality: N/A;  . NOSE SURGERY     for skin cancer  . POLYPECTOMY  07/28/2017   Procedure: POLYPECTOMY;  Surgeon: Rogene Houston, MD;  Location: [facility];  Service: Endoscopy;;  colon     Allergies Clonazepam, Codeine, and Sulfa antibiotics  Family History  Problem Relation Age of Onset  . Heart attack Father     Social History Social History   Tobacco Use  . Smoking status: Never Smoker  . Smokeless tobacco: Never Used  Vaping Use  . Vaping Use: Never used  Substance Use Topics  . Alcohol use: No    Alcohol/week: 0.0 standard drinks  . Drug use: No    Review of Systems  Constitutional: No fever/chills Eyes: No visual changes. ENT: No sore throat. Cardiovascular: Denies chest pain. Respiratory: Denies shortness of breath. Gastrointestinal: Positive abdominal pain.  No nausea, no vomiting.  No diarrhea.  No constipation. Genitourinary: Decreased urine output.  Musculoskeletal: Negative for back pain. Skin: Negative for rash. Neurological: Negative for headaches, focal weakness or numbness.  10-point ROS otherwise negative.  ____________________________________________   PHYSICAL EXAM:  VITAL SIGNS: ED Triage Vitals  Enc Vitals Group     BP 07/29/20 1512 108/75     Pulse Rate 07/29/20 1512 79     Resp 07/29/20 1512 16     Temp 07/29/20 1512 97.6 F (36.4 C)     Temp Source 07/29/20  1512 Oral     SpO2 07/29/20 1512 100 %     Weight 07/29/20 1513 117 lb (53.1 kg)     Height 07/29/20 1513 5\' 4"  (1.626 m)   Constitutional: Alert and oriented. Well appearing and in no acute distress. Eyes: Conjunctivae are normal.  Head: Atraumatic. Nose: No congestion/rhinnorhea. Mouth/Throat: Mucous membranes are moist. Neck: No stridor.   Cardiovascular: Normal rate, regular rhythm. Good peripheral circulation. Grossly normal heart sounds.   Respiratory: Normal respiratory effort.  No retractions. Lungs CTAB. Gastrointestinal: Soft and nontender. No distention.  Musculoskeletal: No lower extremity tenderness with trace bilateral lower extremity edema. No gross deformities of extremities. Neurologic:  Normal speech and language. Skin:  Skin is warm, dry and intact. No rash noted.  ____________________________________________   LABS (all labs ordered are listed, but only abnormal results are displayed)  Labs Reviewed  COMPREHENSIVE METABOLIC PANEL - Abnormal; Notable for the following components:      Result Value   Sodium 130 (*)    Potassium 3.4 (*)    Chloride 97 (*)    Glucose, Bld 100 (*)  Creatinine, Ser 1.49 (*)    Calcium 8.7 (*)    GFR, Estimated 33 (*)    All other components within normal limits  CBC WITH DIFFERENTIAL/PLATELET - Abnormal; Notable for the following components:   RBC 3.71 (*)    Hemoglobin 11.2 (*)    HCT 35.9 (*)    All other components within normal limits  URINALYSIS, ROUTINE W REFLEX MICROSCOPIC - Abnormal; Notable for the following components:   Color, Urine STRAW (*)    Specific Gravity, Urine 1.004 (*)    Nitrite POSITIVE (*)    Leukocytes,Ua TRACE (*)    Bacteria, UA RARE (*)    All other components within normal limits  URINE CULTURE  LIPASE, BLOOD   ____________________________________________  RADIOLOGY  CT Renal Stone Study  Result Date: 07/29/2020 CLINICAL DATA:  Dysuria for 2 days, flank pain, history of anal cancer  EXAM: CT ABDOMEN AND PELVIS WITHOUT CONTRAST TECHNIQUE: Multidetector CT imaging of the abdomen and pelvis was performed following the standard protocol without IV contrast. COMPARISON:  03/17/2019 FINDINGS: Lower chest: Trace left pleural effusion unchanged since prior exam. No acute airspace disease. Hepatobiliary: No focal liver abnormality is seen. No gallstones, gallbladder wall thickening, or biliary dilatation. Pancreas: Unremarkable. No pancreatic ductal dilatation or surrounding inflammatory changes. Spleen: Normal in size without focal abnormality. Adrenals/Urinary Tract: There is a stable right renal cortical cyst. 2 mm nonobstructing calculus lower pole right kidney unchanged. Bilateral extrarenal pelves are seen, right greater than left. No hydronephrosis. The bladder is decompressed with a Foley catheter in place. The adrenals are unremarkable. Stomach/Bowel: No bowel obstruction or ileus. Normal gas-filled appendix right lower quadrant. No inflammatory changes. Wall thickening at the anal verge may be post therapeutic, and is stable since prior study. Vascular/Lymphatic: Aortic atherosclerosis. No enlarged abdominal or pelvic lymph nodes. Reproductive: Uterus and bilateral adnexa are unremarkable. Other: No free fluid or free gas.  No abdominal wall hernia. Musculoskeletal: No acute or destructive bony lesions. Reconstructed images demonstrate no additional findings. IMPRESSION: 1. Stable 2 mm nonobstructing right renal calculus. 2. Stable wall thickening at the anal verge, likely post therapeutic changes in this patient with history of anal cancer. 3. Stable trace left pleural effusion. 4.  Aortic Atherosclerosis (ICD10-I70.0). Electronically Signed   By: Randa Ngo M.D.   On: 07/29/2020 18:31    ____________________________________________   PROCEDURES  Procedure(s) performed:   Procedures  None  ____________________________________________   INITIAL IMPRESSION / ASSESSMENT AND  PLAN / ED COURSE  Pertinent labs & imaging results that were available during my care of the patient were reviewed by me and considered in my medical decision making (see chart for details).   Patient presents to the emergency department for evaluation of decreased urine output with no urination reported since yesterday.  She has no focal abdominal tenderness including in the suprapubic area.  No appreciated fullness.  Will have the nursing staff perform a bladder scan and in and out cath as needed for urine collection.  She was recently in the hospital for diuresis and on torsemide and so need to consider the possibility of kidney injury/renal failure.  We will draw lab work and reassess.   05:00 PM  > 400 mL on bladder scan. Plan to place foley and follow labs.   Lab work consistent with nitrite positive UTI.  No leukocytosis.  Plan for Keflex here and will leave the Foley catheter in place.  Plan for urology follow-up and void trial in the office after antibiotics.  I placed an ambulatory referral in our system to facilitate this follow-up appointment.  Her remaining lab work is overall reassuring.  She does have a mild hyponatremia at 130 but in discussion with her daughter, now at bedside, the patient has had good energy and doing well overall until her urine issue.  Creatinine is slightly elevated compared to her discharge lab work but that may be due to some retention.  She is advised to follow with her PCP for repeat testing. No other acute findings. Plan for discharge with ED return precautions.  ____________________________________________  FINAL CLINICAL IMPRESSION(S) / ED DIAGNOSES  Final diagnoses:  Urinary retention  Acute cystitis without hematuria  Hyponatremia     MEDICATIONS GIVEN DURING THIS VISIT:  Medications  cefTRIAXone (ROCEPHIN) 1 g in sodium chloride 0.9 % 100 mL IVPB (1 g Intravenous New Bag/Given 07/29/20 1830)     NEW OUTPATIENT MEDICATIONS STARTED DURING THIS  VISIT:  New Prescriptions   CEPHALEXIN (KEFLEX) 500 MG CAPSULE    Take 1 capsule (500 mg total) by mouth 3 (three) times daily for 7 days.    Note:  This document was prepared using Dragon voice recognition software and may include unintentional dictation errors.  Nanda Quinton, MD, [facility] Emergency Medicine    Axiel Fjeld, [facility], MD 07/29/20 [phone]

## 2020-07-29 NOTE — ED Notes (Signed)
Pt transported to CT ?

## 2020-07-31 DIAGNOSIS — K52839 Microscopic colitis, unspecified: Secondary | ICD-10-CM | POA: Diagnosis not present

## 2020-07-31 DIAGNOSIS — K222 Esophageal obstruction: Secondary | ICD-10-CM | POA: Diagnosis not present

## 2020-07-31 DIAGNOSIS — I482 Chronic atrial fibrillation, unspecified: Secondary | ICD-10-CM | POA: Diagnosis not present

## 2020-07-31 DIAGNOSIS — I110 Hypertensive heart disease with heart failure: Secondary | ICD-10-CM | POA: Diagnosis not present

## 2020-07-31 DIAGNOSIS — G8929 Other chronic pain: Secondary | ICD-10-CM | POA: Diagnosis not present

## 2020-07-31 DIAGNOSIS — K449 Diaphragmatic hernia without obstruction or gangrene: Secondary | ICD-10-CM | POA: Diagnosis not present

## 2020-07-31 DIAGNOSIS — D62 Acute posthemorrhagic anemia: Secondary | ICD-10-CM | POA: Diagnosis not present

## 2020-07-31 DIAGNOSIS — R42 Dizziness and giddiness: Secondary | ICD-10-CM | POA: Diagnosis not present

## 2020-07-31 DIAGNOSIS — I5031 Acute diastolic (congestive) heart failure: Secondary | ICD-10-CM | POA: Diagnosis not present

## 2020-07-31 DIAGNOSIS — E785 Hyperlipidemia, unspecified: Secondary | ICD-10-CM | POA: Diagnosis not present

## 2020-07-31 DIAGNOSIS — M549 Dorsalgia, unspecified: Secondary | ICD-10-CM | POA: Diagnosis not present

## 2020-07-31 DIAGNOSIS — E079 Disorder of thyroid, unspecified: Secondary | ICD-10-CM | POA: Diagnosis not present

## 2020-08-01 DIAGNOSIS — I482 Chronic atrial fibrillation, unspecified: Secondary | ICD-10-CM | POA: Diagnosis not present

## 2020-08-01 DIAGNOSIS — G8929 Other chronic pain: Secondary | ICD-10-CM | POA: Diagnosis not present

## 2020-08-01 DIAGNOSIS — M549 Dorsalgia, unspecified: Secondary | ICD-10-CM | POA: Diagnosis not present

## 2020-08-01 DIAGNOSIS — I110 Hypertensive heart disease with heart failure: Secondary | ICD-10-CM | POA: Diagnosis not present

## 2020-08-01 DIAGNOSIS — R42 Dizziness and giddiness: Secondary | ICD-10-CM | POA: Diagnosis not present

## 2020-08-01 DIAGNOSIS — E079 Disorder of thyroid, unspecified: Secondary | ICD-10-CM | POA: Diagnosis not present

## 2020-08-01 DIAGNOSIS — I5031 Acute diastolic (congestive) heart failure: Secondary | ICD-10-CM | POA: Diagnosis not present

## 2020-08-01 DIAGNOSIS — E785 Hyperlipidemia, unspecified: Secondary | ICD-10-CM | POA: Diagnosis not present

## 2020-08-01 DIAGNOSIS — D62 Acute posthemorrhagic anemia: Secondary | ICD-10-CM | POA: Diagnosis not present

## 2020-08-01 DIAGNOSIS — K222 Esophageal obstruction: Secondary | ICD-10-CM | POA: Diagnosis not present

## 2020-08-01 DIAGNOSIS — K449 Diaphragmatic hernia without obstruction or gangrene: Secondary | ICD-10-CM | POA: Diagnosis not present

## 2020-08-01 DIAGNOSIS — K52839 Microscopic colitis, unspecified: Secondary | ICD-10-CM | POA: Diagnosis not present

## 2020-08-02 LAB — URINE CULTURE: Culture: 20000 — AB

## 2020-08-03 ENCOUNTER — Telehealth: Payer: Self-pay | Admitting: *Deleted

## 2020-08-03 NOTE — Progress Notes (Signed)
Cardiology Office Note  Date: 08/04/2020   ID: Renee Blake, DOB [birth year], MRN [id number]  PCP:  Benita Stabile, MD  Cardiologist:  None Electrophysiologist:  None   Chief Complaint: Follow-up chronic diastolic heart failure  History of Present Illness: Renee Blake is a 87 y.o. female with a history of chronic diastolic heart failure, PAF, anemia, CKD stage III, history of GIB  Previous hospitalization January 2021 for worsening confusion found to be in rapid atrial fibrillation and acute CHF.  Transitioned to torsemide 40 mg twice daily at discharge.  Palliative care consulted in setting of failure to thrive.  Discharged on home hospice service.  Last visit Dr. Purvis Sheffield 07/27/2019.  She had been stable and her legs were not swollen.  Hospitalists  told her she could take an extra tablet of torsemide as needed.  She was symptomatically stable with her atrial fibrillation on Cardizem SR 90 mg p.o. twice daily and Toprol-XL 50 mg daily.  Not on anticoagulation due to history of chronic anemia and GI bleeding.  Blood pressure was normal.  There were no changes to therapy.  Hemoglobin was 10.3 on 04/26/2019.  Baseline creatinine was 1.1-1.2 range.  Dr. Purvis Sheffield suggested if renal function had declined since hospitalization torsemide should be reduced to 40 mg daily.  Recent presentation to [facility] emergency department 03/27/2020 for complaints of abdominal pain and feeling weak.  EMS found her to be hypotensive.  She was blaming it on taking her blood pressure medicines to close together.  She complained of head and neck pain.  Stated she felt weak and nauseous.  CBC was normal.  Low but stable hemoglobin.  Mild elevation of BUN and creatinine reflecting some dehydration per ER provider.  Urinalysis indicated white cells and nitrate positive.  She had IV fluids and antibiotics with improvement in her blood pressure.  Chest x-ray negative for any acute process.  She was discharged on  Keflex and Zofran.  Diagnosis of UTI, dehydration, weakness.   Recent presentation and admission on 07/25/2020 for decompensated heart failure.  She was diuresed with IV Lasix and had good urine output.  She had been compliant with home medications, salt restriction and fluid intake.  Her home dose of torsemide was increased to 60 mg in a.m. and 30 mg in p.m.  Her atrial fibrillation was rate controlled.  She is currently on Cardizem and metoprolol.  She was not on anticoagulation due to history of previous bleeding in the past.  3 days later she presented to ED with complaints of inability to urinate.  She was diagnosed with a urinary tract infection, urinary retention, acute cystitis without hematuria, hyponatremia.  Keflex was started in the emergency room and Foley catheter placed.  Plan was for follow-up with urology and void trial in office after antibiotics.  She is here today status post hospital admission.  Denies any shortness of breath, weight gain.  Today's weight is 04/01/2017.  Denies any lower extremity edema.  Her torsemide was increased to 60 mg a.m. and 30 mg p.m.  Recent lab work on 07/29/2020 with potassium of 3.4, sodium 138, glucose 100, creatinine 1.49, GFR 33.  Hemoglobin 11.2, hematocrit 35.9.  She still has an indwelling Foley catheter secondary to urinary retention after discovery of UTI.  Daughter states plan is to have it removed later on today.  She denies any shortness of breath. Echocardiogram on 07/26/2020 demonstrated EF of 65-70.  No WMA's.  LV diastolic parameters indeterminate, mildly elevated  pulmonary systolic pressure 33.2 mmHg.  Mild to moderate MR and TR   Past Medical History:  Diagnosis Date  . Acute blood loss anemia   . Atrial fibrillation ([place])    a. diagnosed in 12/2018. Rate-control pursued given not a candidate for anticoagulation  . Chronic diarrhea   . Dilation of biliary tract   . Duodenal stricture   . Esophageal stricture   . Gastric ulcer   . GI  bleed   . Hyperlipemia   . Hypertension   . Microscopic colitis   . Nephrolithiasis   . Pancreatic duct dilated   . Schatzki's ring   . Skin cancer   . Sliding hiatal hernia   . Vertigo     Past Surgical History:  Procedure Laterality Date  . BIOPSY  07/28/2017   Procedure: BIOPSY;  Surgeon: Rogene Houston, MD;  Location: [facility];  Service: Endoscopy;;  rectum  . COLONOSCOPY N/A 07/28/2017   Procedure: COLONOSCOPY;  Surgeon: Rogene Houston, MD;  Location: [facility] ENDO [facility];  Service: Endoscopy;  Laterality: N/A;  . ESOPHAGOGASTRODUODENOSCOPY N/A 02/27/2013   Procedure: ESOPHAGOGASTRODUODENOSCOPY (EGD);  Surgeon: Ladene Artist, MD;  Location: [facility] ENDOSCOPY;  Service: Endoscopy;  Laterality: N/A;  . ESOPHAGOGASTRODUODENOSCOPY N/A 03/28/2013   Procedure: ESOPHAGOGASTRODUODENOSCOPY (EGD);  Surgeon: Rogene Houston, MD;  Location: [facility];  Service: Endoscopy;  Laterality: N/A;  . HOT HEMOSTASIS N/A 02/27/2013   Procedure: HOT HEMOSTASIS (ARGON PLASMA COAGULATION/BICAP);  Surgeon: Ladene Artist, MD;  Location: [facility] ENDOSCOPY;  Service: Endoscopy;  Laterality: N/A;  . NOSE SURGERY     for skin cancer  . POLYPECTOMY  07/28/2017   Procedure: POLYPECTOMY;  Surgeon: Rogene Houston, MD;  Location: [facility];  Service: Endoscopy;;  colon     Current Outpatient Medications  Medication Sig Dispense Refill  . acetaminophen (TYLENOL) 325 MG tablet Take 650 mg by mouth every 6 (six) hours as needed for mild pain or moderate pain.    Marland Kitchen acetaminophen (TYLENOL) 500 MG tablet Take 1,000 mg by mouth every 6 (six) hours as needed.    . ALPRAZolam (XANAX) 0.25 MG tablet Take 1 tablet (0.25 mg total) by mouth 2 (two) times daily as needed. for anxiety 8 tablet 0  . cephALEXin (KEFLEX) 500 MG capsule Take 1 capsule (500 mg total) by mouth 3 (three) times daily for 7 days. 21 capsule 0  . diltiazem (CARDIZEM SR) 90 MG 12 hr capsule Take 1 capsule (90 mg total) by mouth 2 (two) times daily.  180 capsule 1  . levothyroxine (SYNTHROID) 50 MCG tablet Take 50 mcg by mouth daily.    Marland Kitchen loperamide (IMODIUM A-D) 2 MG tablet Take 2 mg by mouth daily as needed for diarrhea or loose stools.    . meclizine (ANTIVERT) 25 MG tablet Take 25 mg by mouth daily as needed for dizziness.     . metoprolol succinate (TOPROL-XL) 25 MG 24 hr tablet Take 1 tablet (25 mg total) by mouth daily. 90 tablet 1  . ondansetron (ZOFRAN) 4 MG tablet Take 1 tablet (4 mg total) by mouth every 8 (eight) hours as needed for nausea or vomiting. 20 tablet 0  . potassium chloride SA (KLOR-CON) 20 MEQ tablet Take 1 tablet (20 mEq total) by mouth daily. 90 tablet 1  . torsemide (DEMADEX) 20 MG tablet Take 20 mg by mouth daily. TAKE 2 TABLETS IN THE MORNING AND 1 AND 1/2 TABLETS IN THE EVENING    . traMADol (ULTRAM)  50 MG tablet Take 50 mg by mouth every 6 (six) hours as needed.     No current facility-administered medications for this visit.   Allergies:  Clonazepam, Codeine, and Sulfa antibiotics   Social History: The patient  reports that she has never smoked. She has never used smokeless tobacco. She reports that she does not drink alcohol and does not use drugs.   Family History: The patient's family history includes Heart attack in her father.   ROS:  Please see the history of present illness. Otherwise, complete review of systems is positive for none.  All other systems are reviewed and negative.   Physical Exam: VS:  Pulse 64   Ht 5' (1.524 m)   Wt 119 lb (54 kg)   SpO2 98%   BMI 23.24 kg/m , BMI Body mass index is 23.24 kg/m.  Wt Readings from Last 3 Encounters:  08/04/20 119 lb (54 kg)  07/29/20 117 lb (53.1 kg)  07/26/20 125 lb 10.6 oz (57 kg)    General: Patient appears comfortable at rest. Lungs: Clear to auscultation, nonlabored breathing at rest. Cardiac: irregularly irregular rate and rhythm, no S3 or significant systolic murmur, no pericardial rub. Extremities: No pitting edema, distal pulses  2+. Skin: Warm and dry. Musculoskeletal: No kyphosis. Neuropsychiatric: Alert and oriented x3, affect grossly appropriate.  ECG:  EKG 10/18/2019 showed atrial fibrillation rate of 74 right bundle branch block and left anterior fascicular block.  Recent Labwork: 07/25/2020: B Natriuretic Peptide 301.0 07/26/2020: Magnesium 2.0; TSH 0.372 07/29/2020: ALT 13; AST 16; BUN 18; Creatinine, Ser 1.49; Hemoglobin 11.2; Platelets 229; Potassium 3.4; Sodium 130  No results found for: CHOL, TRIG, HDL, CHOLHDL, VLDL, LDLCALC, LDLDIRECT     Other Studies Reviewed Today:  Echocardiogram 07/26/2020 1. Left ventricular ejection fraction, by estimation, is 65 to 70%. The left ventricle has normal function. The left ventricle has no regional wall motion abnormalities. Left ventricular diastolic parameters are indeterminate. 2. Right ventricular systolic function is normal. The right ventricular size is normal. There is mildly elevated pulmonary artery systolic pressure. The estimated right ventricular systolic pressure is 13.2 mmHg. 3. Left atrial size was upper normal. 4. Right atrial size was upper normal. 5. There is a trivial pericardial effusion posterior to the left ventricle. 6. The mitral valve is grossly normal. Mild to moderate mitral valve regurgitation. 7. Tricuspid valve regurgitation is mild to moderate. 8. The aortic valve is tricuspid. Aortic valve regurgitation is not visualized. 9. The inferior vena cava is normal in size with greater than 50% respiratory variability, suggesting right atrial pressure of 3 mmHg.      Echocardiogram 04/07/2019 at [place]  1. Technically difficult study due to patient position.  2. The left ventricle is normal in size with mildly increased wall  thickness.  3. The left ventricular systolic function is normal, LVEF is visually  estimated at 60-65%.  4. The left atrium is severely dilated in size.  5. There is severe mostly  posteriorly directed mitral regurgitation.  6. The right ventricle is normal in size, with normal systolic function.  7. The right atrium is moderately to severely dilated in size.  8. There is moderate to severe tricuspid regurgitation.  9. There is mild-moderate pulmonary hypertension, estimated pulmonary artery  systolic pressure is 48 mmHg.       Echocardiogram: 12/2018 IMPRESSIONS  1. Left ventricular ejection fraction, by visual estimation, is 60 to  65%. The left ventricle has normal function. There is no left  ventricular  hypertrophy.  2. Elevated left atrial pressure.  3. Left ventricular diastolic parameters are consistent with Grade II  diastolic dysfunction (pseudonormalization).  4. Global right ventricle has normal systolic function.The right  ventricular size is normal. No increase in right ventricular wall  thickness.  5. Left atrial size was severely dilated.  6. Right atrial size was mildly dilated.  7. The mitral valve is abnormal. Moderate mitral valve regurgitation. No  evidence of mitral stenosis.  8. The MR vena contracta is 0.6 cm. The MV/AV TVI ratio is 1.1. Findings  support moderate mitral regurgitation.  9. The tricuspid valve is normal in structure. Tricuspid valve  regurgitation is mild.  10. The aortic valve is tricuspid. Aortic valve regurgitation is not  visualized. No evidence of aortic valve sclerosis or stenosis.  11. The pulmonic valve was not well visualized. Pulmonic valve  regurgitation is mild.  12. Moderately elevated pulmonary artery systolic pressure.  13. The inferior vena cava is normal in size with greater than 50%  respiratory variability, suggesting right atrial pressure of 3 mmHg.   Assessment and Plan:   1. Chronic diastolic heart failure (HCC) Denies any shortness of breath or dyspnea on exertion, PND, orthopnea, weight gain, lower extremity edema.  Decrease a.m. torsemide to 40 mg mg a.m. and 30 mg p.m.  continue metoprolol 25 mg daily.  Continue potassium supplementation.  Get basic metabolic panel and magnesium in 2 weeks to recheck electrolytes and renal function.  2. Persistent atrial fibrillation ([place]) Continues in atrial fibrillation with rate 64.  Continue diltiazem SR 90 mg po bid .  Continue Toprol XL 25 mg daily. Not on anticoagulation d/t hx of GIB  3. Essential hypertension Blood pressure well controlled today at 80/60.  Continue Toprol-XL 25 mg daily.  We are decreasing torsemide to 40 mg a.m. and 30 mg p.o. daily today hopefully this will increase her blood pressure.  Advised to start checking blood pressures at home.  4. Anemia, unspecified type Labs from PCP 04/10/2020 demonstrated hemoglobin 11.8 and hematocrit of 36.1%  No complaints of active bleeding or black tarry stools.  5. Stage 3 chronic kidney disease, unspecified whether stage 3a or 3b CKD Recent labs demonstrated creatinine of 1.49 and GFR of 33.  We are decreasing a.m. dose of torsemide to 40 mg and p.m. dose of torsemide to remain at 30 mg.  We are getting a basic metabolic panel and magnesium in 2 weeks for follow-up.  6.  Severe mitral regurgitation Most recent echo on 04/07/2019 showed severe posteriorly directed mitral regurgitation.  Patient is asymptomatic.  Medication Adjustments/Labs and Tests Ordered: Current medicines are reviewed at length with the patient today.  Concerns regarding medicines are outlined above.   Disposition: Follow-up with Dr. Domenic Polite or APP 2 weeks.  Signed, Levell July, NP 08/04/2020 11:43 AM    [facility] [place], [place], [place] [address] Phone: [phone]; Fax: [phone]

## 2020-08-03 NOTE — Telephone Encounter (Signed)
Post ED Visit - Positive Culture Follow-up  Culture report reviewed by antimicrobial stewardship pharmacist: [place] Team []  Elenor Quinones, Pharm.D. []  Heide Guile, Pharm.D., BCPS AQ-ID []  Parks Neptune, Pharm.D., BCPS []  Alycia Rossetti, Pharm.D., BCPS []  [place], Pharm.D., BCPS, AAHIVP []  Legrand Como, Pharm.D., BCPS, AAHIVP []  Salome Arnt, PharmD, BCPS []  Johnnette Gourd, PharmD, BCPS []  Hughes Better, PharmD, BCPS []  Leeroy Cha, PharmD []  Laqueta Linden, PharmD, BCPS []  Albertina Parr, PharmD  [place] Team []  Leodis Sias, PharmD []  Lindell Spar, PharmD []  Royetta Asal, PharmD []  Graylin Shiver, Rph []  Rema Fendt) Glennon Mac, PharmD []  Arlyn Dunning, PharmD []  Netta Cedars, PharmD []  Dia Sitter, PharmD []  Leone Haven, PharmD []  Gretta Arab, PharmD []  Theodis Shove, PharmD []  Peggyann Juba, PharmD []  Reuel Boom, PharmD   Positive urine culture Treated with Cephalexin, organism sensitive to the same and no further patient follow-up is required at this time.  Fara Olden, PharmD  [facility] 08/03/2020, 9:32 AM

## 2020-08-04 ENCOUNTER — Ambulatory Visit (INDEPENDENT_AMBULATORY_CARE_PROVIDER_SITE_OTHER): Payer: Medicare Other | Admitting: Family Medicine

## 2020-08-04 ENCOUNTER — Encounter: Payer: Self-pay | Admitting: Family Medicine

## 2020-08-04 VITALS — BP 80/60 | HR 64 | Ht 60.0 in | Wt 119.0 lb

## 2020-08-04 DIAGNOSIS — I340 Nonrheumatic mitral (valve) insufficiency: Secondary | ICD-10-CM | POA: Diagnosis not present

## 2020-08-04 DIAGNOSIS — G8929 Other chronic pain: Secondary | ICD-10-CM | POA: Diagnosis not present

## 2020-08-04 DIAGNOSIS — R42 Dizziness and giddiness: Secondary | ICD-10-CM | POA: Diagnosis not present

## 2020-08-04 DIAGNOSIS — N183 Chronic kidney disease, stage 3 unspecified: Secondary | ICD-10-CM | POA: Diagnosis not present

## 2020-08-04 DIAGNOSIS — I4819 Other persistent atrial fibrillation: Secondary | ICD-10-CM

## 2020-08-04 DIAGNOSIS — I110 Hypertensive heart disease with heart failure: Secondary | ICD-10-CM | POA: Diagnosis not present

## 2020-08-04 DIAGNOSIS — D649 Anemia, unspecified: Secondary | ICD-10-CM | POA: Diagnosis not present

## 2020-08-04 DIAGNOSIS — I5032 Chronic diastolic (congestive) heart failure: Secondary | ICD-10-CM

## 2020-08-04 DIAGNOSIS — K449 Diaphragmatic hernia without obstruction or gangrene: Secondary | ICD-10-CM | POA: Diagnosis not present

## 2020-08-04 DIAGNOSIS — E785 Hyperlipidemia, unspecified: Secondary | ICD-10-CM | POA: Diagnosis not present

## 2020-08-04 DIAGNOSIS — D62 Acute posthemorrhagic anemia: Secondary | ICD-10-CM | POA: Diagnosis not present

## 2020-08-04 DIAGNOSIS — I10 Essential (primary) hypertension: Secondary | ICD-10-CM | POA: Diagnosis not present

## 2020-08-04 DIAGNOSIS — M549 Dorsalgia, unspecified: Secondary | ICD-10-CM | POA: Diagnosis not present

## 2020-08-04 DIAGNOSIS — I482 Chronic atrial fibrillation, unspecified: Secondary | ICD-10-CM | POA: Diagnosis not present

## 2020-08-04 DIAGNOSIS — K52839 Microscopic colitis, unspecified: Secondary | ICD-10-CM | POA: Diagnosis not present

## 2020-08-04 DIAGNOSIS — E079 Disorder of thyroid, unspecified: Secondary | ICD-10-CM | POA: Diagnosis not present

## 2020-08-04 DIAGNOSIS — I5031 Acute diastolic (congestive) heart failure: Secondary | ICD-10-CM | POA: Diagnosis not present

## 2020-08-04 DIAGNOSIS — K222 Esophageal obstruction: Secondary | ICD-10-CM | POA: Diagnosis not present

## 2020-08-04 NOTE — Patient Instructions (Signed)
Your physician recommends that you schedule a follow-up appointment in: [place], NP  Your physician has recommended you make the following change in your medication:   DECREASE TORSEMIDE 40 MG IN THE MORNING AND 30 MG IN THE EVENING   Your physician recommends that you return for lab work JUST BEFORE YOUR NEXT APPOINTMENT BMP/MG  Thank you for choosing [facility]!!

## 2020-08-05 DIAGNOSIS — K52839 Microscopic colitis, unspecified: Secondary | ICD-10-CM | POA: Diagnosis not present

## 2020-08-05 DIAGNOSIS — R42 Dizziness and giddiness: Secondary | ICD-10-CM | POA: Diagnosis not present

## 2020-08-05 DIAGNOSIS — I110 Hypertensive heart disease with heart failure: Secondary | ICD-10-CM | POA: Diagnosis not present

## 2020-08-05 DIAGNOSIS — K222 Esophageal obstruction: Secondary | ICD-10-CM | POA: Diagnosis not present

## 2020-08-05 DIAGNOSIS — M549 Dorsalgia, unspecified: Secondary | ICD-10-CM | POA: Diagnosis not present

## 2020-08-05 DIAGNOSIS — G8929 Other chronic pain: Secondary | ICD-10-CM | POA: Diagnosis not present

## 2020-08-05 DIAGNOSIS — K449 Diaphragmatic hernia without obstruction or gangrene: Secondary | ICD-10-CM | POA: Diagnosis not present

## 2020-08-05 DIAGNOSIS — I482 Chronic atrial fibrillation, unspecified: Secondary | ICD-10-CM | POA: Diagnosis not present

## 2020-08-05 DIAGNOSIS — E785 Hyperlipidemia, unspecified: Secondary | ICD-10-CM | POA: Diagnosis not present

## 2020-08-05 DIAGNOSIS — I5031 Acute diastolic (congestive) heart failure: Secondary | ICD-10-CM | POA: Diagnosis not present

## 2020-08-05 DIAGNOSIS — D62 Acute posthemorrhagic anemia: Secondary | ICD-10-CM | POA: Diagnosis not present

## 2020-08-05 DIAGNOSIS — E079 Disorder of thyroid, unspecified: Secondary | ICD-10-CM | POA: Diagnosis not present

## 2020-08-07 ENCOUNTER — Ambulatory Visit (INDEPENDENT_AMBULATORY_CARE_PROVIDER_SITE_OTHER): Payer: Medicare Other | Admitting: Urology

## 2020-08-07 ENCOUNTER — Other Ambulatory Visit: Payer: Self-pay

## 2020-08-07 ENCOUNTER — Encounter: Payer: Self-pay | Admitting: Urology

## 2020-08-07 VITALS — BP 108/68 | HR 80 | Temp 97.8°F | Ht 65.0 in | Wt 119.0 lb

## 2020-08-07 DIAGNOSIS — M549 Dorsalgia, unspecified: Secondary | ICD-10-CM | POA: Diagnosis not present

## 2020-08-07 DIAGNOSIS — G8929 Other chronic pain: Secondary | ICD-10-CM | POA: Diagnosis not present

## 2020-08-07 DIAGNOSIS — E785 Hyperlipidemia, unspecified: Secondary | ICD-10-CM | POA: Diagnosis not present

## 2020-08-07 DIAGNOSIS — R339 Retention of urine, unspecified: Secondary | ICD-10-CM

## 2020-08-07 DIAGNOSIS — D62 Acute posthemorrhagic anemia: Secondary | ICD-10-CM | POA: Diagnosis not present

## 2020-08-07 DIAGNOSIS — E079 Disorder of thyroid, unspecified: Secondary | ICD-10-CM | POA: Diagnosis not present

## 2020-08-07 DIAGNOSIS — R42 Dizziness and giddiness: Secondary | ICD-10-CM | POA: Diagnosis not present

## 2020-08-07 DIAGNOSIS — I110 Hypertensive heart disease with heart failure: Secondary | ICD-10-CM | POA: Diagnosis not present

## 2020-08-07 DIAGNOSIS — I482 Chronic atrial fibrillation, unspecified: Secondary | ICD-10-CM | POA: Diagnosis not present

## 2020-08-07 DIAGNOSIS — I5031 Acute diastolic (congestive) heart failure: Secondary | ICD-10-CM | POA: Diagnosis not present

## 2020-08-07 DIAGNOSIS — K222 Esophageal obstruction: Secondary | ICD-10-CM | POA: Diagnosis not present

## 2020-08-07 DIAGNOSIS — K449 Diaphragmatic hernia without obstruction or gangrene: Secondary | ICD-10-CM | POA: Diagnosis not present

## 2020-08-07 DIAGNOSIS — K52839 Microscopic colitis, unspecified: Secondary | ICD-10-CM | POA: Diagnosis not present

## 2020-08-07 NOTE — Progress Notes (Signed)
08/07/2020 10:03 AM   Renee Blake 02-28-1929 [id number]  Referring provider: Margette Fast, MD [address] [place],  [place] [address]  Urinary retention  HPI: Ms Renee Blake is a 87yo here for evaluation of urinary rentention. She developed urinary retention on 4/30 and presented to the DER and had a foley placed. Urine culture grew e coli and she was treated for 7 days with keflex. She gets 2 UTIs per year. She denies any significant LUTS except for 2 days prior to the foley being placed.    PMH: Past Medical History:  Diagnosis Date  . Acute blood loss anemia   . Atrial fibrillation ([place])    a. diagnosed in 12/2018. Rate-control pursued given not a candidate for anticoagulation  . Chronic diarrhea   . Dilation of biliary tract   . Duodenal stricture   . Esophageal stricture   . Gastric ulcer   . GI bleed   . Hyperlipemia   . Hypertension   . Microscopic colitis   . Nephrolithiasis   . Pancreatic duct dilated   . Schatzki's ring   . Skin cancer   . Sliding hiatal hernia   . Vertigo     Surgical History: Past Surgical History:  Procedure Laterality Date  . BIOPSY  07/28/2017   Procedure: BIOPSY;  Surgeon: Rogene Houston, MD;  Location: [facility];  Service: Endoscopy;;  rectum  . COLONOSCOPY N/A 07/28/2017   Procedure: COLONOSCOPY;  Surgeon: Rogene Houston, MD;  Location: [facility];  Service: Endoscopy;  Laterality: N/A;  . ESOPHAGOGASTRODUODENOSCOPY N/A 02/27/2013   Procedure: ESOPHAGOGASTRODUODENOSCOPY (EGD);  Surgeon: Ladene Artist, MD;  Location: [facility] ENDOSCOPY;  Service: Endoscopy;  Laterality: N/A;  . ESOPHAGOGASTRODUODENOSCOPY N/A 03/28/2013   Procedure: ESOPHAGOGASTRODUODENOSCOPY (EGD);  Surgeon: Rogene Houston, MD;  Location: [facility];  Service: Endoscopy;  Laterality: N/A;  . HOT HEMOSTASIS N/A 02/27/2013   Procedure: HOT HEMOSTASIS (ARGON PLASMA COAGULATION/BICAP);  Surgeon: Ladene Artist, MD;  Location: [facility] ENDOSCOPY;  Service: Endoscopy;   Laterality: N/A;  . NOSE SURGERY     for skin cancer  . POLYPECTOMY  07/28/2017   Procedure: POLYPECTOMY;  Surgeon: Rogene Houston, MD;  Location: [facility];  Service: Endoscopy;;  colon     Home Medications:  Allergies as of 08/07/2020      Reactions   Clonazepam Other (See Comments)   Pruritis   Codeine Nausea And Vomiting, Other (See Comments)   Reaction unknown   Sulfa Antibiotics Other (See Comments)   Reactions unknown      Medication List       Accurate as of Aug 07, 2020 10:03 AM. If you have any questions, ask your nurse or doctor.        acetaminophen 325 MG tablet Commonly known as: TYLENOL Take 650 mg by mouth every 6 (six) hours as needed for mild pain or moderate pain.   acetaminophen 500 MG tablet Commonly known as: TYLENOL Take 1,000 mg by mouth every 6 (six) hours as needed.   ALPRAZolam 0.25 MG tablet Commonly known as: XANAX Take 1 tablet (0.25 mg total) by mouth 2 (two) times daily as needed. for anxiety   diltiazem 90 MG 12 hr capsule Commonly known as: CARDIZEM SR Take 1 capsule (90 mg total) by mouth 2 (two) times daily.   levothyroxine 50 MCG tablet Commonly known as: SYNTHROID Take 50 mcg by mouth daily.   loperamide 2 MG tablet Commonly known as: IMODIUM A-D  Take 2 mg by mouth daily as needed for diarrhea or loose stools.   meclizine 25 MG tablet Commonly known as: ANTIVERT Take 25 mg by mouth daily as needed for dizziness.   metoprolol succinate 25 MG 24 hr tablet Commonly known as: TOPROL-XL Take 1 tablet (25 mg total) by mouth daily.   ondansetron 4 MG tablet Commonly known as: Zofran Take 1 tablet (4 mg total) by mouth every 8 (eight) hours as needed for nausea or vomiting.   potassium chloride SA 20 MEQ tablet Commonly known as: KLOR-CON Take 1 tablet (20 mEq total) by mouth daily.   torsemide 20 MG tablet Commonly known as: DEMADEX Take 20 mg by mouth daily. TAKE 2 TABLETS IN THE MORNING AND 1 AND 1/2 TABLETS IN  THE EVENING   traMADol 50 MG tablet Commonly known as: ULTRAM Take 50 mg by mouth every 6 (six) hours as needed.       Allergies:  Allergies  Allergen Reactions  . Clonazepam Other (See Comments)    Pruritis  . Codeine Nausea And Vomiting and Other (See Comments)    Reaction unknown  . Sulfa Antibiotics Other (See Comments)    Reactions unknown    Family History: Family History  Problem Relation Age of Onset  . Heart attack Father     Social History:  reports that she has never smoked. She has never used smokeless tobacco. She reports that she does not drink alcohol and does not use drugs.  ROS: All other review of systems were reviewed and are negative except what is noted above in HPI  Physical Exam: BP 108/68   Pulse 80   Temp 97.8 F (36.6 C)   Ht 5\' 5"  (1.651 m)   Wt 119 lb (54 kg)   BMI 19.80 kg/m   Constitutional:  Alert and oriented, No acute distress. HEENT: [place] AT, moist mucus membranes.  Trachea midline, no masses. Cardiovascular: No clubbing, cyanosis, or edema. Respiratory: Normal respiratory effort, no increased work of breathing. GI: Abdomen is soft, nontender, nondistended, no abdominal masses GU: No CVA tenderness.  Lymph: No cervical or inguinal lymphadenopathy. Skin: No rashes, bruises or suspicious lesions. Neurologic: Grossly intact, no focal deficits, moving all 4 extremities. Psychiatric: Normal mood and affect.  Laboratory Data: Lab Results  Component Value Date   WBC 4.8 07/29/2020   HGB 11.2 (L) 07/29/2020   HCT 35.9 (L) 07/29/2020   MCV 96.8 07/29/2020   PLT 229 07/29/2020    Lab Results  Component Value Date   CREATININE 1.49 (H) 07/29/2020    No results found for: PSA  No results found for: TESTOSTERONE  Lab Results  Component Value Date   HGBA1C 5.9 (H) 08/23/2014    Urinalysis    Component Value Date/Time   COLORURINE STRAW (A) 07/29/2020 1720   APPEARANCEUR CLEAR 07/29/2020 1720   LABSPEC 1.004 (L)  07/29/2020 1720   PHURINE 6.0 07/29/2020 1720   GLUCOSEU NEGATIVE 07/29/2020 1720   HGBUR NEGATIVE 07/29/2020 1720   BILIRUBINUR NEGATIVE 07/29/2020 1720   KETONESUR NEGATIVE 07/29/2020 1720   PROTEINUR NEGATIVE 07/29/2020 1720   UROBILINOGEN 0.2 08/22/2014 1300   NITRITE POSITIVE (A) 07/29/2020 1720   LEUKOCYTESUR TRACE (A) 07/29/2020 1720    Lab Results  Component Value Date   BACTERIA RARE (A) 07/29/2020    Pertinent Imaging:  No results found for this or any previous visit.  Results for orders placed during the hospital encounter of 06/07/13  US Venous Img Lower Bilateral  Narrative CLINICAL DATA:  Bilateral lower extremity edema  EXAM: BILATERAL LOWER EXTREMITY VENOUS DUPLEX ULTRASOUND  TECHNIQUE: Gray-scale sonography with graded compression, as well as color Doppler and duplex ultrasound, were performed to evaluate the deep venous system from the level of the common femoral vein through the popliteal and proximal calf veins. Spectral Doppler was utilized to evaluate flow at rest and with distal augmentation maneuvers.  COMPARISON:  None.  FINDINGS: Real-time and Doppler interrogation of both lower extremity venous systems was performed. Flow in the venous structures of both lower extremities is spontaneous and phasic in all segments. There is normal compression and augmentation in the venous structures of both lower extremities. Venous Doppler signal is normal in all regions bilaterally. There is no thrombus in the deep or visualized superficial venous structures of either lower extremity. There is no deep venous incompetence in either lower extremity.  IMPRESSION: No evidence of deep venous thrombosis in either lower extremity.   Electronically Signed By: Lowella Grip M.D. On: 06/07/2013 15:33  No results found for this or any previous visit.  No results found for this or any previous visit.  No results found for this or any previous  visit.  No results found for this or any previous visit.  No results found for this or any previous visit.  Results for orders placed during the hospital encounter of 07/29/20  CT Renal Stone Study  Narrative CLINICAL DATA:  Dysuria for 2 days, flank pain, history of anal cancer  EXAM: CT ABDOMEN AND PELVIS WITHOUT CONTRAST  TECHNIQUE: Multidetector CT imaging of the abdomen and pelvis was performed following the standard protocol without IV contrast.  COMPARISON:  03/17/2019  FINDINGS: Lower chest: Trace left pleural effusion unchanged since prior exam. No acute airspace disease.  Hepatobiliary: No focal liver abnormality is seen. No gallstones, gallbladder wall thickening, or biliary dilatation.  Pancreas: Unremarkable. No pancreatic ductal dilatation or surrounding inflammatory changes.  Spleen: Normal in size without focal abnormality.  Adrenals/Urinary Tract: There is a stable right renal cortical cyst. 2 mm nonobstructing calculus lower pole right kidney unchanged. Bilateral extrarenal pelves are seen, right greater than left. No hydronephrosis.  The bladder is decompressed with a Foley catheter in place. The adrenals are unremarkable.  Stomach/Bowel: No bowel obstruction or ileus. Normal gas-filled appendix right lower quadrant. No inflammatory changes. Wall thickening at the anal verge may be post therapeutic, and is stable since prior study.  Vascular/Lymphatic: Aortic atherosclerosis. No enlarged abdominal or pelvic lymph nodes.  Reproductive: Uterus and bilateral adnexa are unremarkable.  Other: No free fluid or free gas.  No abdominal wall hernia.  Musculoskeletal: No acute or destructive bony lesions. Reconstructed images demonstrate no additional findings.  IMPRESSION: 1. Stable 2 mm nonobstructing right renal calculus. 2. Stable wall thickening at the anal verge, likely post therapeutic changes in this patient with history of anal  cancer. 3. Stable trace left pleural effusion. 4.  Aortic Atherosclerosis (ICD10-I70.0).   Electronically Signed By: Randa Ngo M.D. On: 07/29/2020 18:31   Assessment & Plan:    1. Urinary retention Voiding trial passed today. RTC 1 month with PVR   No follow-ups on file.  Nicolette Bang, MD  [facility] [place]

## 2020-08-07 NOTE — Progress Notes (Signed)
Fill and Pull Catheter Removal  Patient is present today for a catheter removal.  Patient was cleaned and prepped in a sterile fashion 125ml of sterile water/ saline was instilled into the bladder when the patient felt the urge to urinate. 48ml of water was then drained from the balloon.  A 14FR foley cath was removed from the bladder no complications were noted .  Patient as then given some time to void on their own.  Patient can void  124ml on their own after some time.  Patient tolerated well.  Performed by: Estill Bamberg RN

## 2020-08-07 NOTE — Progress Notes (Signed)
Urological Symptom Review  Patient is experiencing the following symptoms: Urinary tract infection   Review of Systems  Gastrointestinal (upper)  : Negative for upper GI symptoms  Gastrointestinal (lower) : Diarrhea  Constitutional : Negative for symptoms  Skin: Negative for skin symptoms  Eyes: Negative for eye symptoms  Ear/Nose/Throat : Sinus problems  Hematologic/Lymphatic: Negative for Hematologic/Lymphatic symptoms  Cardiovascular : Negative for cardiovascular symptoms  Respiratory : Negative for respiratory symptoms  Endocrine: Negative for endocrine symptoms  Musculoskeletal: Joint pain  Neurological: Negative for neurological symptoms  Psychologic: Negative for psychiatric symptoms

## 2020-08-08 DIAGNOSIS — D62 Acute posthemorrhagic anemia: Secondary | ICD-10-CM | POA: Diagnosis not present

## 2020-08-08 DIAGNOSIS — K222 Esophageal obstruction: Secondary | ICD-10-CM | POA: Diagnosis not present

## 2020-08-08 DIAGNOSIS — I5031 Acute diastolic (congestive) heart failure: Secondary | ICD-10-CM | POA: Diagnosis not present

## 2020-08-08 DIAGNOSIS — G8929 Other chronic pain: Secondary | ICD-10-CM | POA: Diagnosis not present

## 2020-08-08 DIAGNOSIS — K449 Diaphragmatic hernia without obstruction or gangrene: Secondary | ICD-10-CM | POA: Diagnosis not present

## 2020-08-08 DIAGNOSIS — J9621 Acute and chronic respiratory failure with hypoxia: Secondary | ICD-10-CM | POA: Diagnosis not present

## 2020-08-08 DIAGNOSIS — R0981 Nasal congestion: Secondary | ICD-10-CM | POA: Diagnosis not present

## 2020-08-08 DIAGNOSIS — E079 Disorder of thyroid, unspecified: Secondary | ICD-10-CM | POA: Diagnosis not present

## 2020-08-08 DIAGNOSIS — K219 Gastro-esophageal reflux disease without esophagitis: Secondary | ICD-10-CM | POA: Diagnosis not present

## 2020-08-08 DIAGNOSIS — H9202 Otalgia, left ear: Secondary | ICD-10-CM | POA: Diagnosis not present

## 2020-08-08 DIAGNOSIS — K52839 Microscopic colitis, unspecified: Secondary | ICD-10-CM | POA: Diagnosis not present

## 2020-08-08 DIAGNOSIS — E875 Hyperkalemia: Secondary | ICD-10-CM | POA: Diagnosis not present

## 2020-08-08 DIAGNOSIS — R42 Dizziness and giddiness: Secondary | ICD-10-CM | POA: Diagnosis not present

## 2020-08-08 DIAGNOSIS — M549 Dorsalgia, unspecified: Secondary | ICD-10-CM | POA: Diagnosis not present

## 2020-08-08 DIAGNOSIS — I482 Chronic atrial fibrillation, unspecified: Secondary | ICD-10-CM | POA: Diagnosis not present

## 2020-08-08 DIAGNOSIS — E785 Hyperlipidemia, unspecified: Secondary | ICD-10-CM | POA: Diagnosis not present

## 2020-08-08 DIAGNOSIS — I110 Hypertensive heart disease with heart failure: Secondary | ICD-10-CM | POA: Diagnosis not present

## 2020-08-10 ENCOUNTER — Telehealth (INDEPENDENT_AMBULATORY_CARE_PROVIDER_SITE_OTHER): Payer: Self-pay | Admitting: Internal Medicine

## 2020-08-10 DIAGNOSIS — E079 Disorder of thyroid, unspecified: Secondary | ICD-10-CM | POA: Diagnosis not present

## 2020-08-10 DIAGNOSIS — M549 Dorsalgia, unspecified: Secondary | ICD-10-CM | POA: Diagnosis not present

## 2020-08-10 DIAGNOSIS — R42 Dizziness and giddiness: Secondary | ICD-10-CM | POA: Diagnosis not present

## 2020-08-10 DIAGNOSIS — K449 Diaphragmatic hernia without obstruction or gangrene: Secondary | ICD-10-CM | POA: Diagnosis not present

## 2020-08-10 DIAGNOSIS — I5031 Acute diastolic (congestive) heart failure: Secondary | ICD-10-CM | POA: Diagnosis not present

## 2020-08-10 DIAGNOSIS — G8929 Other chronic pain: Secondary | ICD-10-CM | POA: Diagnosis not present

## 2020-08-10 DIAGNOSIS — I110 Hypertensive heart disease with heart failure: Secondary | ICD-10-CM | POA: Diagnosis not present

## 2020-08-10 DIAGNOSIS — I482 Chronic atrial fibrillation, unspecified: Secondary | ICD-10-CM | POA: Diagnosis not present

## 2020-08-10 DIAGNOSIS — K222 Esophageal obstruction: Secondary | ICD-10-CM | POA: Diagnosis not present

## 2020-08-10 DIAGNOSIS — D62 Acute posthemorrhagic anemia: Secondary | ICD-10-CM | POA: Diagnosis not present

## 2020-08-10 DIAGNOSIS — K52839 Microscopic colitis, unspecified: Secondary | ICD-10-CM | POA: Diagnosis not present

## 2020-08-10 DIAGNOSIS — E785 Hyperlipidemia, unspecified: Secondary | ICD-10-CM | POA: Diagnosis not present

## 2020-08-10 NOTE — Telephone Encounter (Signed)
Operator asked me to call patient's daughter Marlowe Kays. Her daughter Marlowe Kays told me that her mother has not been doing well she has been passing dark stools but she also has had diarrhea for 2 days.  She is felt weak. She is not having hematemesis. Marlowe Kays had called her office but apparently lines were busy. I recommended that she be brought to emergency room for evaluation tonight or tomorrow whenever it is convenient for the family.

## 2020-08-11 ENCOUNTER — Emergency Department (HOSPITAL_COMMUNITY)
Admission: EM | Admit: 2020-08-11 | Discharge: 2020-08-11 | Disposition: A | Discharge status: Home / Self Care | Payer: Medicare Other | Attending: Emergency Medicine | Admitting: Emergency Medicine

## 2020-08-11 ENCOUNTER — Encounter (HOSPITAL_COMMUNITY): Payer: Self-pay

## 2020-08-11 ENCOUNTER — Other Ambulatory Visit: Payer: Self-pay

## 2020-08-11 ENCOUNTER — Emergency Department (HOSPITAL_COMMUNITY): Payer: Medicare Other

## 2020-08-11 DIAGNOSIS — G8929 Other chronic pain: Secondary | ICD-10-CM | POA: Diagnosis not present

## 2020-08-11 DIAGNOSIS — R110 Nausea: Secondary | ICD-10-CM | POA: Diagnosis not present

## 2020-08-11 DIAGNOSIS — M545 Low back pain, unspecified: Secondary | ICD-10-CM | POA: Insufficient documentation

## 2020-08-11 DIAGNOSIS — I130 Hypertensive heart and chronic kidney disease with heart failure and stage 1 through stage 4 chronic kidney disease, or unspecified chronic kidney disease: Secondary | ICD-10-CM | POA: Insufficient documentation

## 2020-08-11 DIAGNOSIS — K921 Melena: Secondary | ICD-10-CM | POA: Insufficient documentation

## 2020-08-11 DIAGNOSIS — M5459 Other low back pain: Secondary | ICD-10-CM | POA: Diagnosis not present

## 2020-08-11 DIAGNOSIS — K449 Diaphragmatic hernia without obstruction or gangrene: Secondary | ICD-10-CM | POA: Diagnosis not present

## 2020-08-11 DIAGNOSIS — Z85828 Personal history of other malignant neoplasm of skin: Secondary | ICD-10-CM | POA: Insufficient documentation

## 2020-08-11 DIAGNOSIS — R1084 Generalized abdominal pain: Secondary | ICD-10-CM | POA: Insufficient documentation

## 2020-08-11 DIAGNOSIS — I5031 Acute diastolic (congestive) heart failure: Secondary | ICD-10-CM | POA: Diagnosis not present

## 2020-08-11 DIAGNOSIS — K222 Esophageal obstruction: Secondary | ICD-10-CM | POA: Diagnosis not present

## 2020-08-11 DIAGNOSIS — I482 Chronic atrial fibrillation, unspecified: Secondary | ICD-10-CM | POA: Diagnosis not present

## 2020-08-11 DIAGNOSIS — M549 Dorsalgia, unspecified: Secondary | ICD-10-CM | POA: Diagnosis not present

## 2020-08-11 DIAGNOSIS — Z79899 Other long term (current) drug therapy: Secondary | ICD-10-CM | POA: Diagnosis not present

## 2020-08-11 DIAGNOSIS — E785 Hyperlipidemia, unspecified: Secondary | ICD-10-CM | POA: Diagnosis not present

## 2020-08-11 DIAGNOSIS — K52839 Microscopic colitis, unspecified: Secondary | ICD-10-CM | POA: Diagnosis not present

## 2020-08-11 DIAGNOSIS — I110 Hypertensive heart disease with heart failure: Secondary | ICD-10-CM | POA: Diagnosis not present

## 2020-08-11 DIAGNOSIS — I5033 Acute on chronic diastolic (congestive) heart failure: Secondary | ICD-10-CM | POA: Diagnosis not present

## 2020-08-11 DIAGNOSIS — N1832 Chronic kidney disease, stage 3b: Secondary | ICD-10-CM | POA: Insufficient documentation

## 2020-08-11 DIAGNOSIS — D62 Acute posthemorrhagic anemia: Secondary | ICD-10-CM | POA: Diagnosis not present

## 2020-08-11 DIAGNOSIS — E079 Disorder of thyroid, unspecified: Secondary | ICD-10-CM | POA: Diagnosis not present

## 2020-08-11 DIAGNOSIS — I517 Cardiomegaly: Secondary | ICD-10-CM | POA: Diagnosis not present

## 2020-08-11 DIAGNOSIS — R42 Dizziness and giddiness: Secondary | ICD-10-CM | POA: Diagnosis not present

## 2020-08-11 DIAGNOSIS — R195 Other fecal abnormalities: Secondary | ICD-10-CM

## 2020-08-11 LAB — COMPREHENSIVE METABOLIC PANEL
ALT: 33 U/L (ref 0–44)
AST: 22 U/L (ref 15–41)
Albumin: 3.7 g/dL (ref 3.5–5.0)
Alkaline Phosphatase: 102 U/L (ref 38–126)
Anion gap: 8 (ref 5–15)
BUN: 26 mg/dL — ABNORMAL HIGH (ref 8–23)
CO2: 25 mmol/L (ref 22–32)
Calcium: 8.8 mg/dL — ABNORMAL LOW (ref 8.9–10.3)
Chloride: 102 mmol/L (ref 98–111)
Creatinine, Ser: 1.46 mg/dL — ABNORMAL HIGH (ref 0.44–1.00)
GFR, Estimated: 34 mL/min — ABNORMAL LOW (ref >60)
Glucose, Bld: 100 mg/dL — ABNORMAL HIGH (ref 70–99)
Potassium: 3.7 mmol/L (ref 3.5–5.1)
Sodium: 135 mmol/L (ref 135–145)
Total Bilirubin: 0.5 mg/dL (ref 0.3–1.2)
Total Protein: 7.5 g/dL (ref 6.5–8.1)

## 2020-08-11 LAB — POC OCCULT BLOOD, ED: Fecal Occult Bld: NEGATIVE

## 2020-08-11 LAB — CBC WITH DIFFERENTIAL/PLATELET
Abs Immature Granulocytes: 0.01 10*3/uL (ref 0.00–0.07)
Basophils Absolute: 0 10*3/uL (ref 0.0–0.1)
Basophils Relative: 0 %
Eosinophils Absolute: 0.1 10*3/uL (ref 0.0–0.5)
Eosinophils Relative: 1 %
HCT: 35 % — ABNORMAL LOW (ref 36.0–46.0)
Hemoglobin: 11.1 g/dL — ABNORMAL LOW (ref 12.0–15.0)
Immature Granulocytes: 0 %
Lymphocytes Relative: 21 %
Lymphs Abs: 1 10*3/uL (ref 0.7–4.0)
MCH: 30.3 pg (ref 26.0–34.0)
MCHC: 31.7 g/dL (ref 30.0–36.0)
MCV: 95.6 fL (ref 80.0–100.0)
Monocytes Absolute: 0.8 10*3/uL (ref 0.1–1.0)
Monocytes Relative: 16 %
Neutro Abs: 3.2 10*3/uL (ref 1.7–7.7)
Neutrophils Relative %: 62 %
Platelets: 279 10*3/uL (ref 150–400)
RBC: 3.66 MIL/uL — ABNORMAL LOW (ref 3.87–5.11)
RDW: 13.8 % (ref 11.5–15.5)
WBC: 5.1 10*3/uL (ref 4.0–10.5)
nRBC: 0 % (ref 0.0–0.2)

## 2020-08-11 IMAGING — DX DG LUMBAR SPINE COMPLETE 4+V
5 series · 5 of 5 positions shown · non-contrast
Comparison: None.

CLINICAL DATA: Low back pain.  No known injury.

EXAM:
LUMBAR SPINE - COMPLETE 4+ VIEW

[l-spine ap]
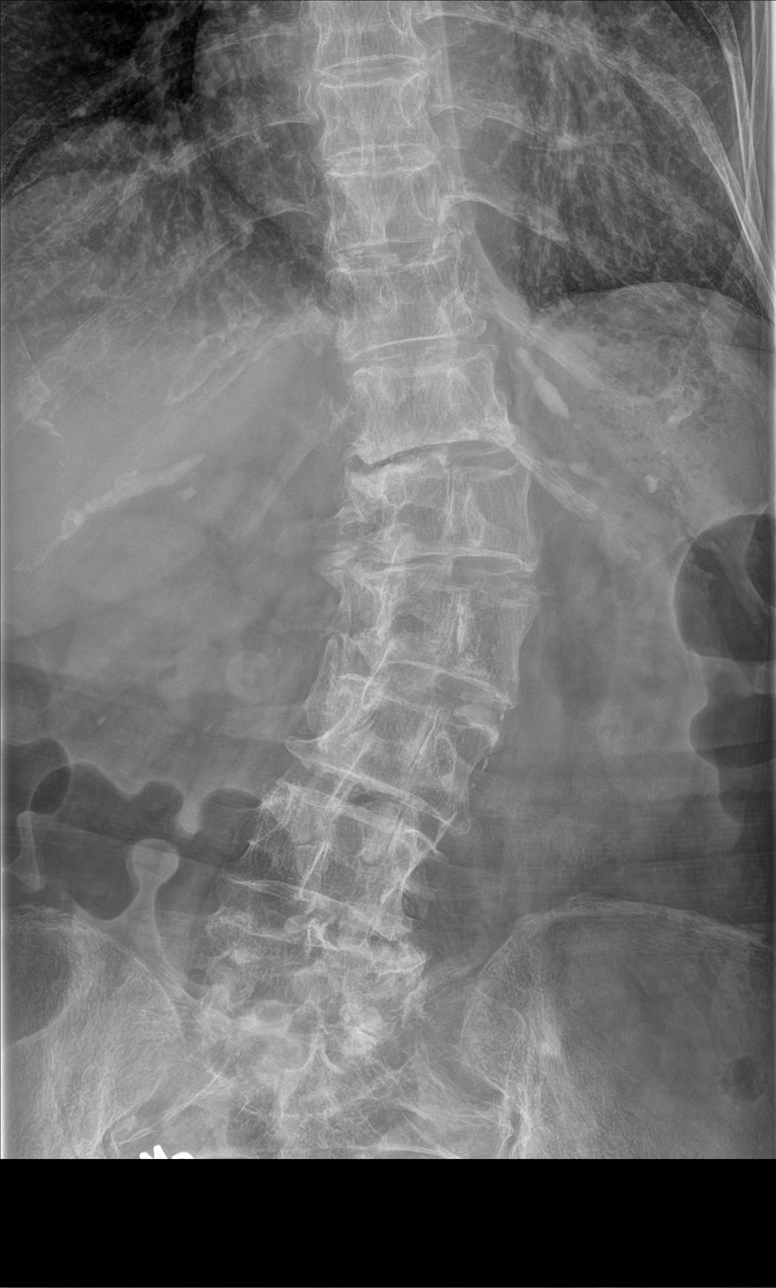

[l-spine obl (1 of 2)]
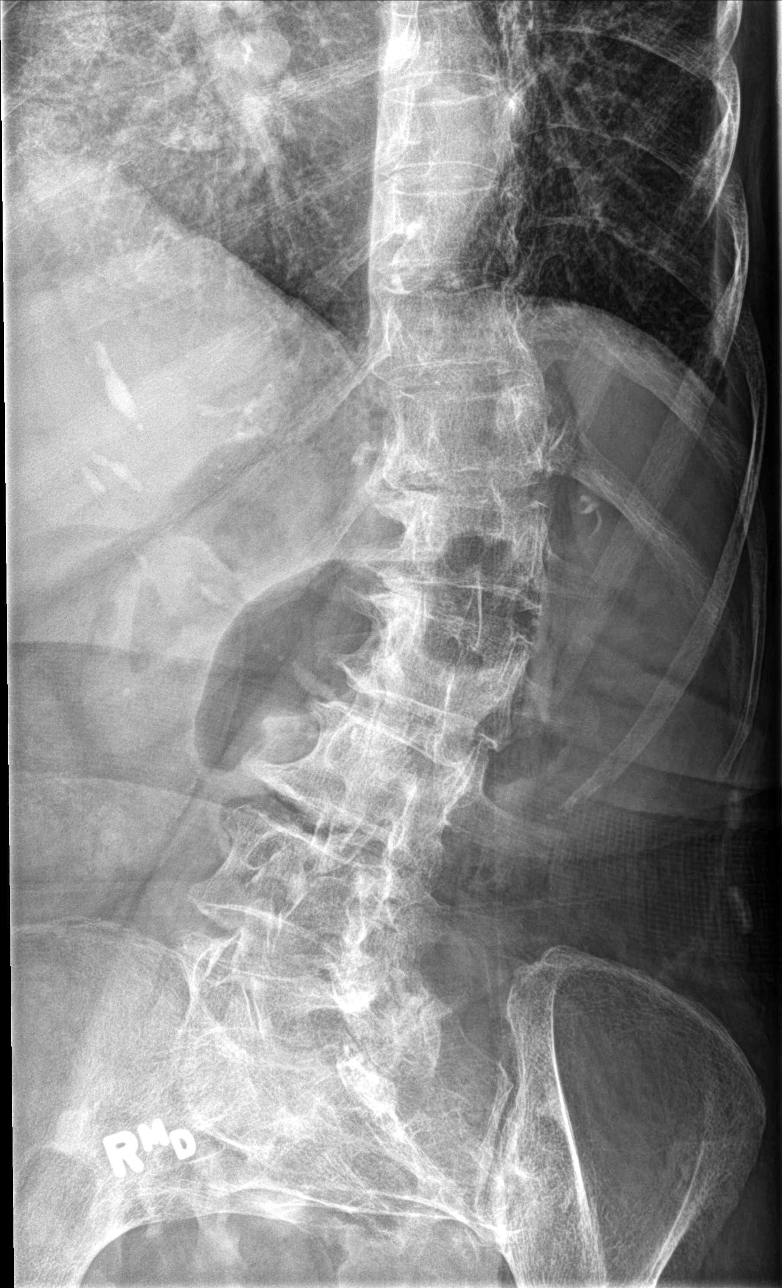

[l-spine obl (2 of 2)]
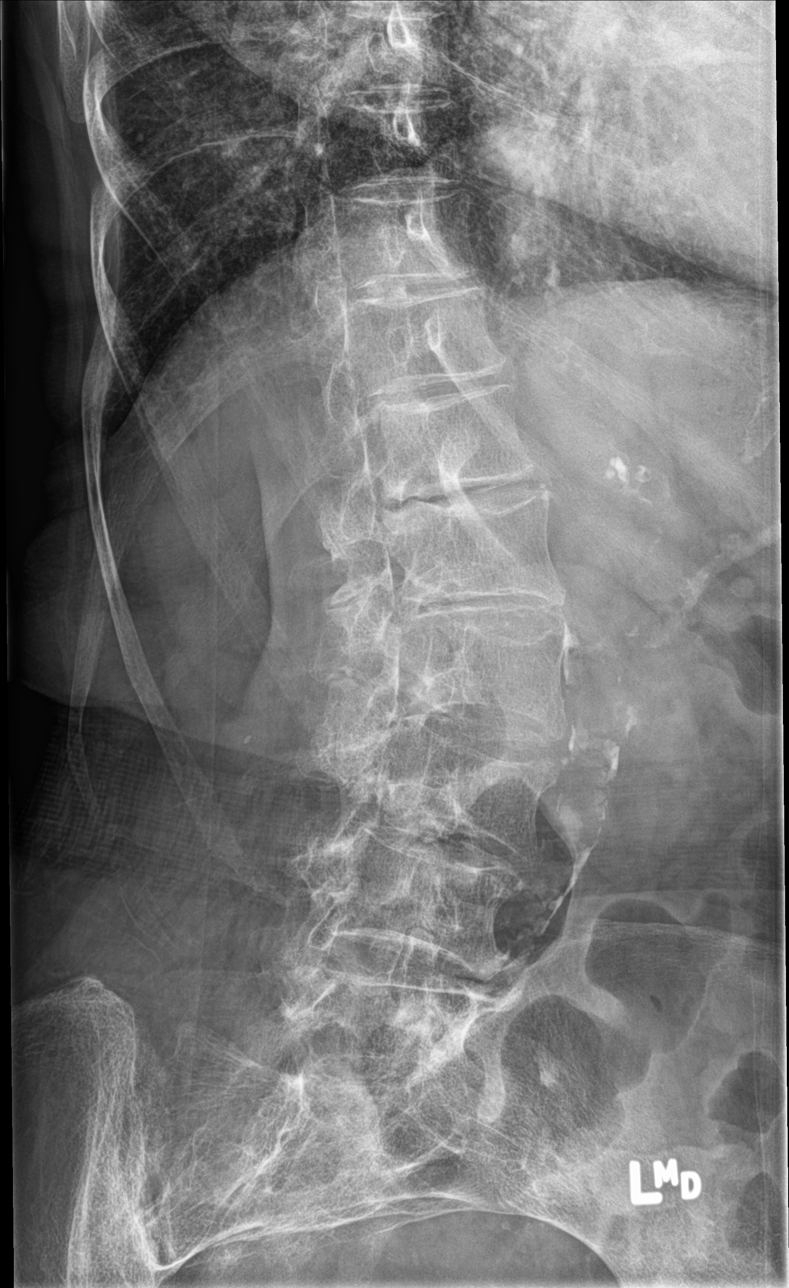

[l-spine lat]
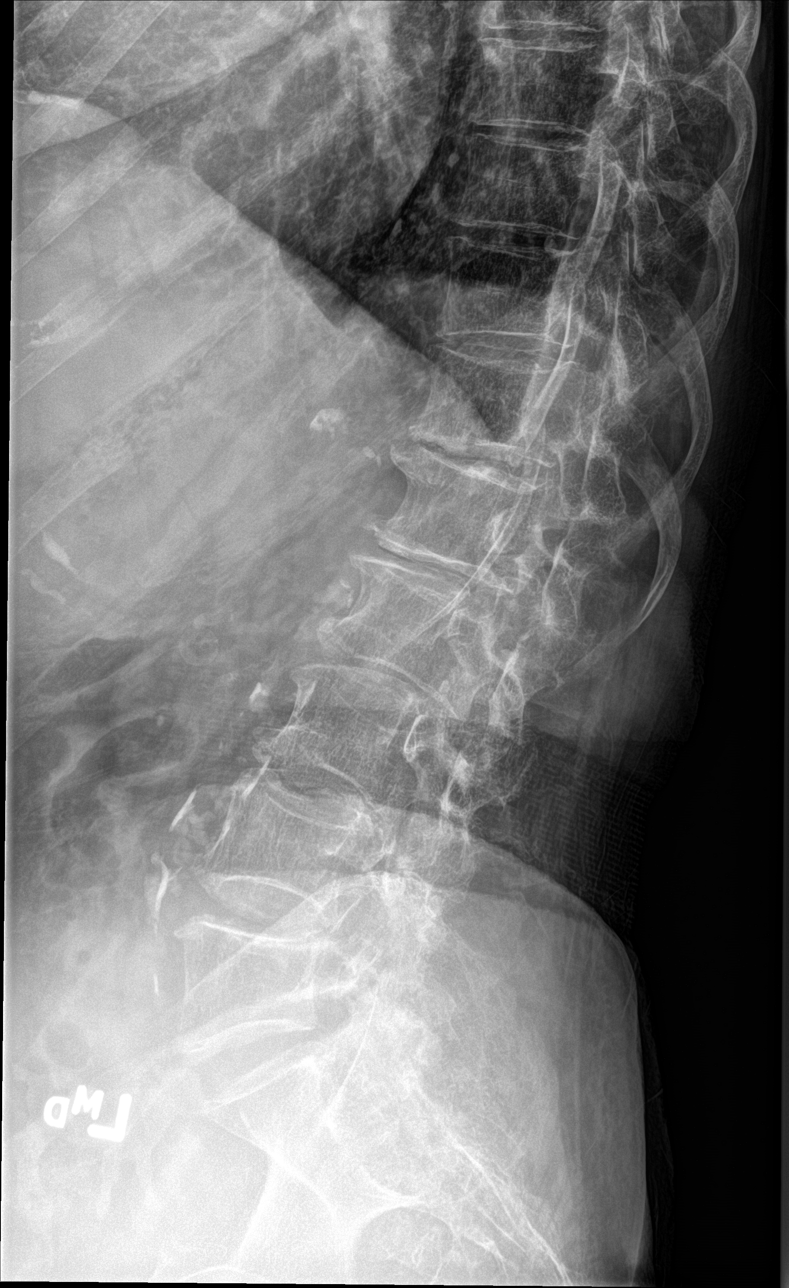

[l-spine spot]
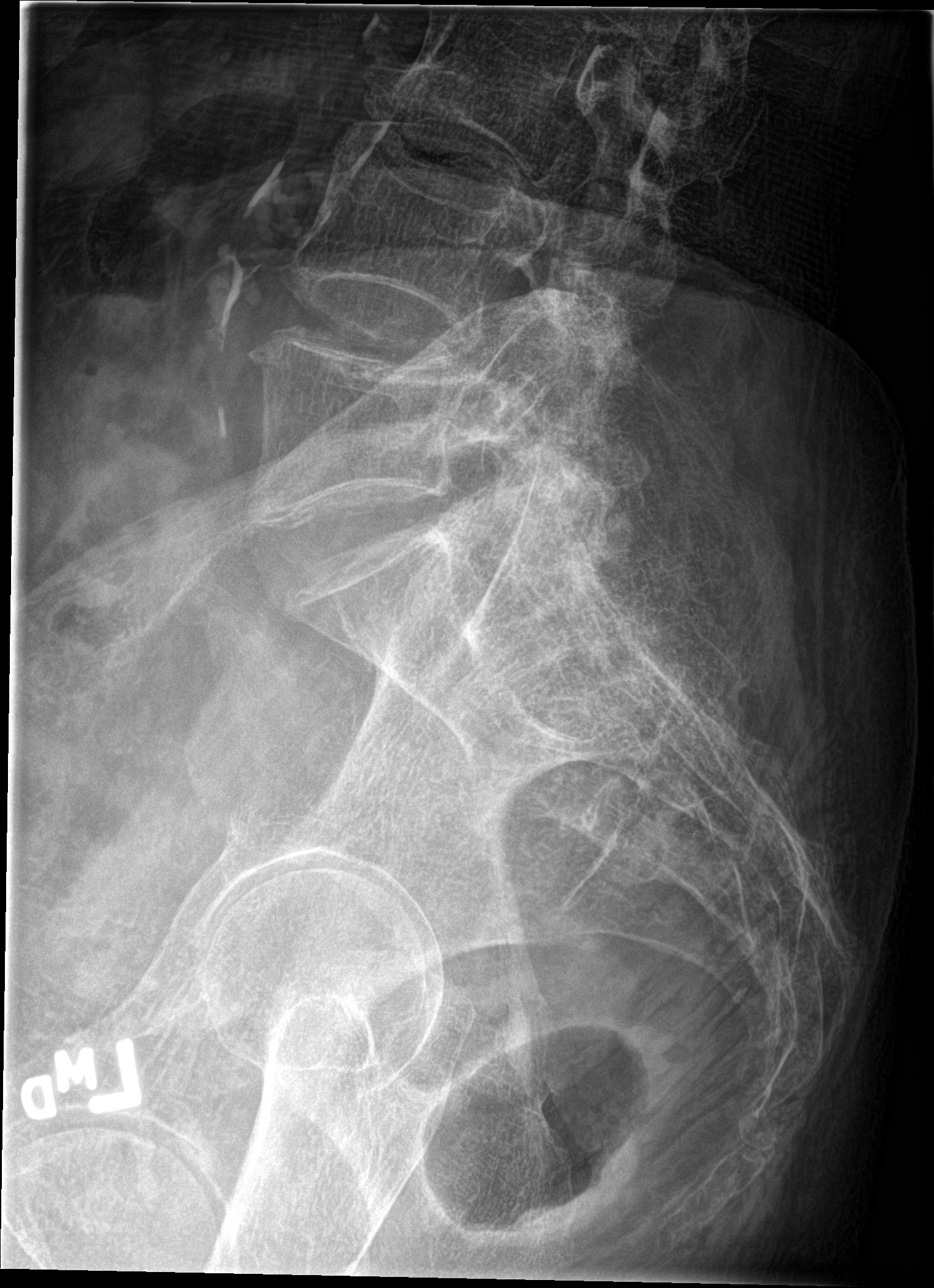

[5 of 5 positions shown; findings below may reference images not displayed]

FINDINGS: Moderate leftward scoliosis centered in the mid lumbar spine.
Diffuse degenerative disc and facet disease. No fracture. SI joints
symmetric and unremarkable.
IMPRESSION: Moderate leftward scoliosis and degenerative changes throughout the
lumbar spine. No acute bony abnormality.

## 2020-08-11 IMAGING — DX DG CHEST 2V
2 series · 2 of 2 positions shown · non-contrast
Comparison: [DATE]

CLINICAL DATA: Low back pain

EXAM:
CHEST - 2 VIEW

[chest pa]
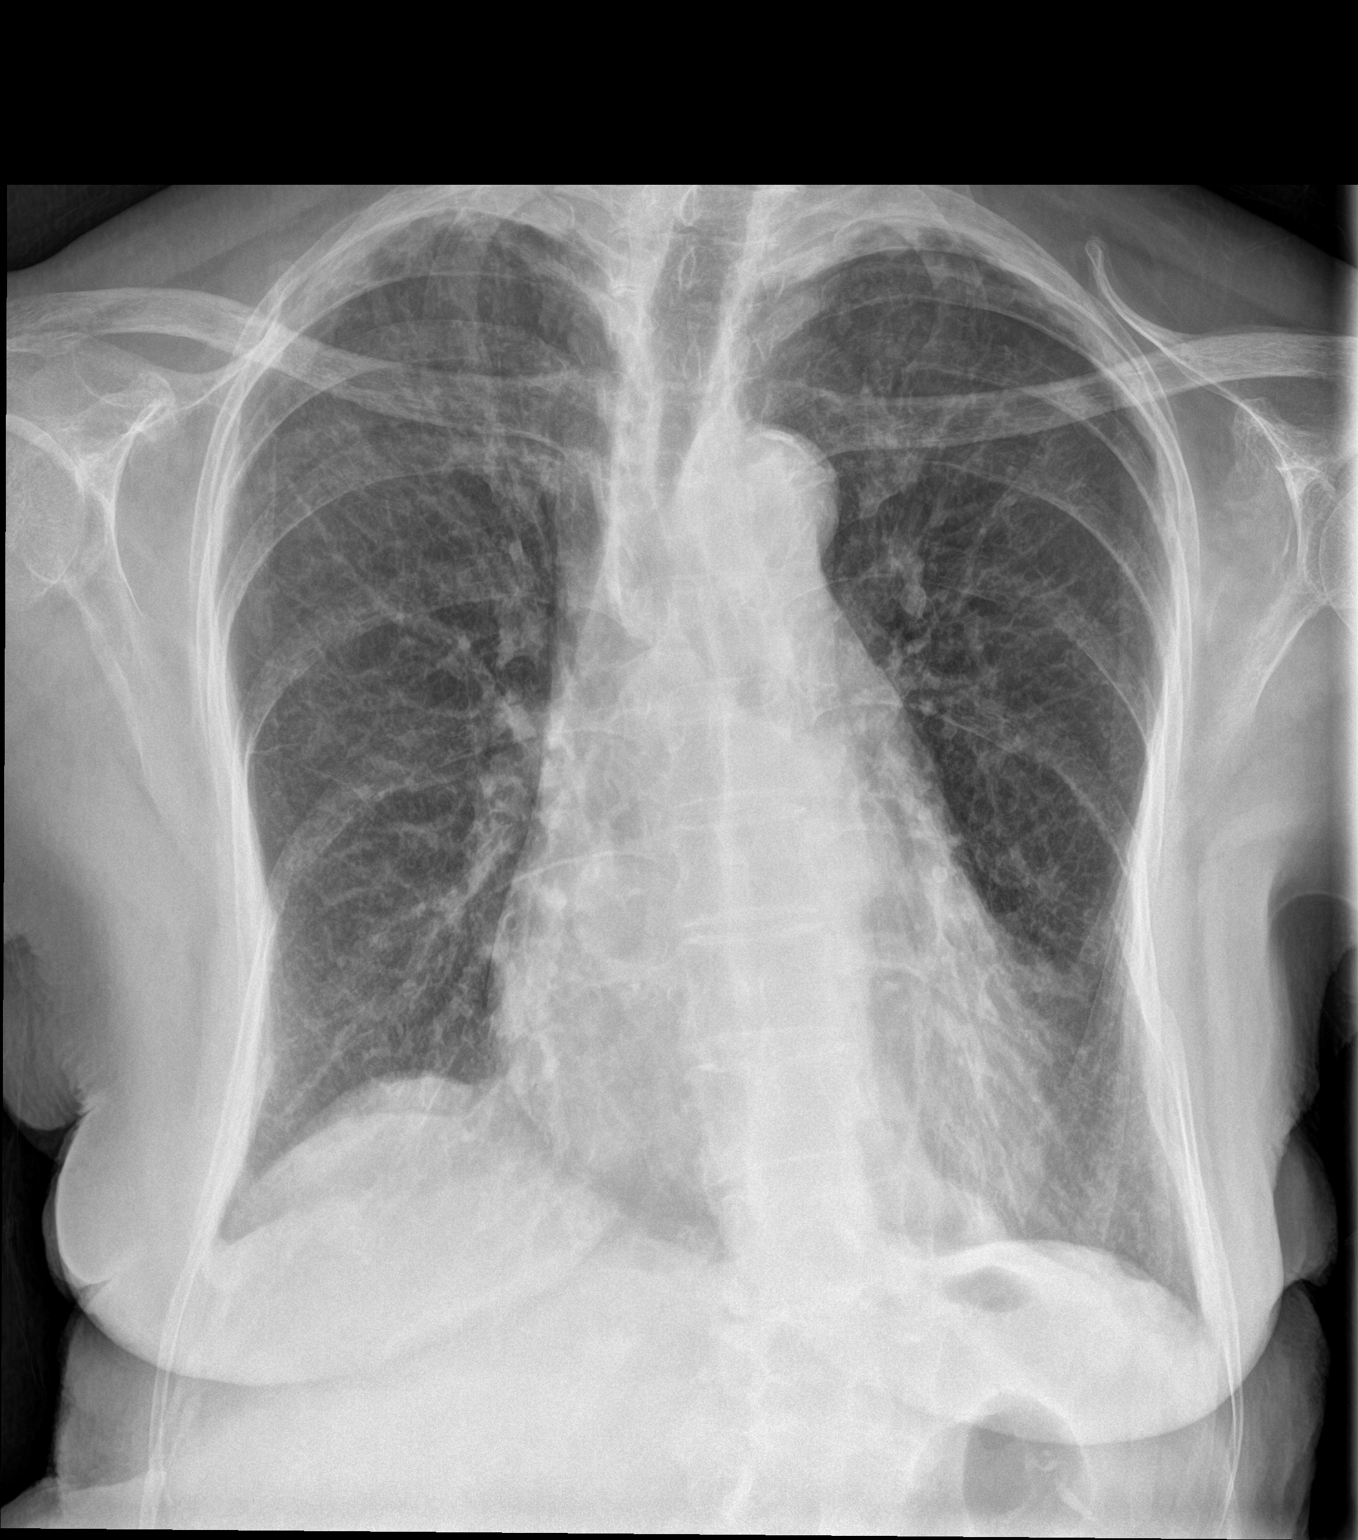

[chest lat]
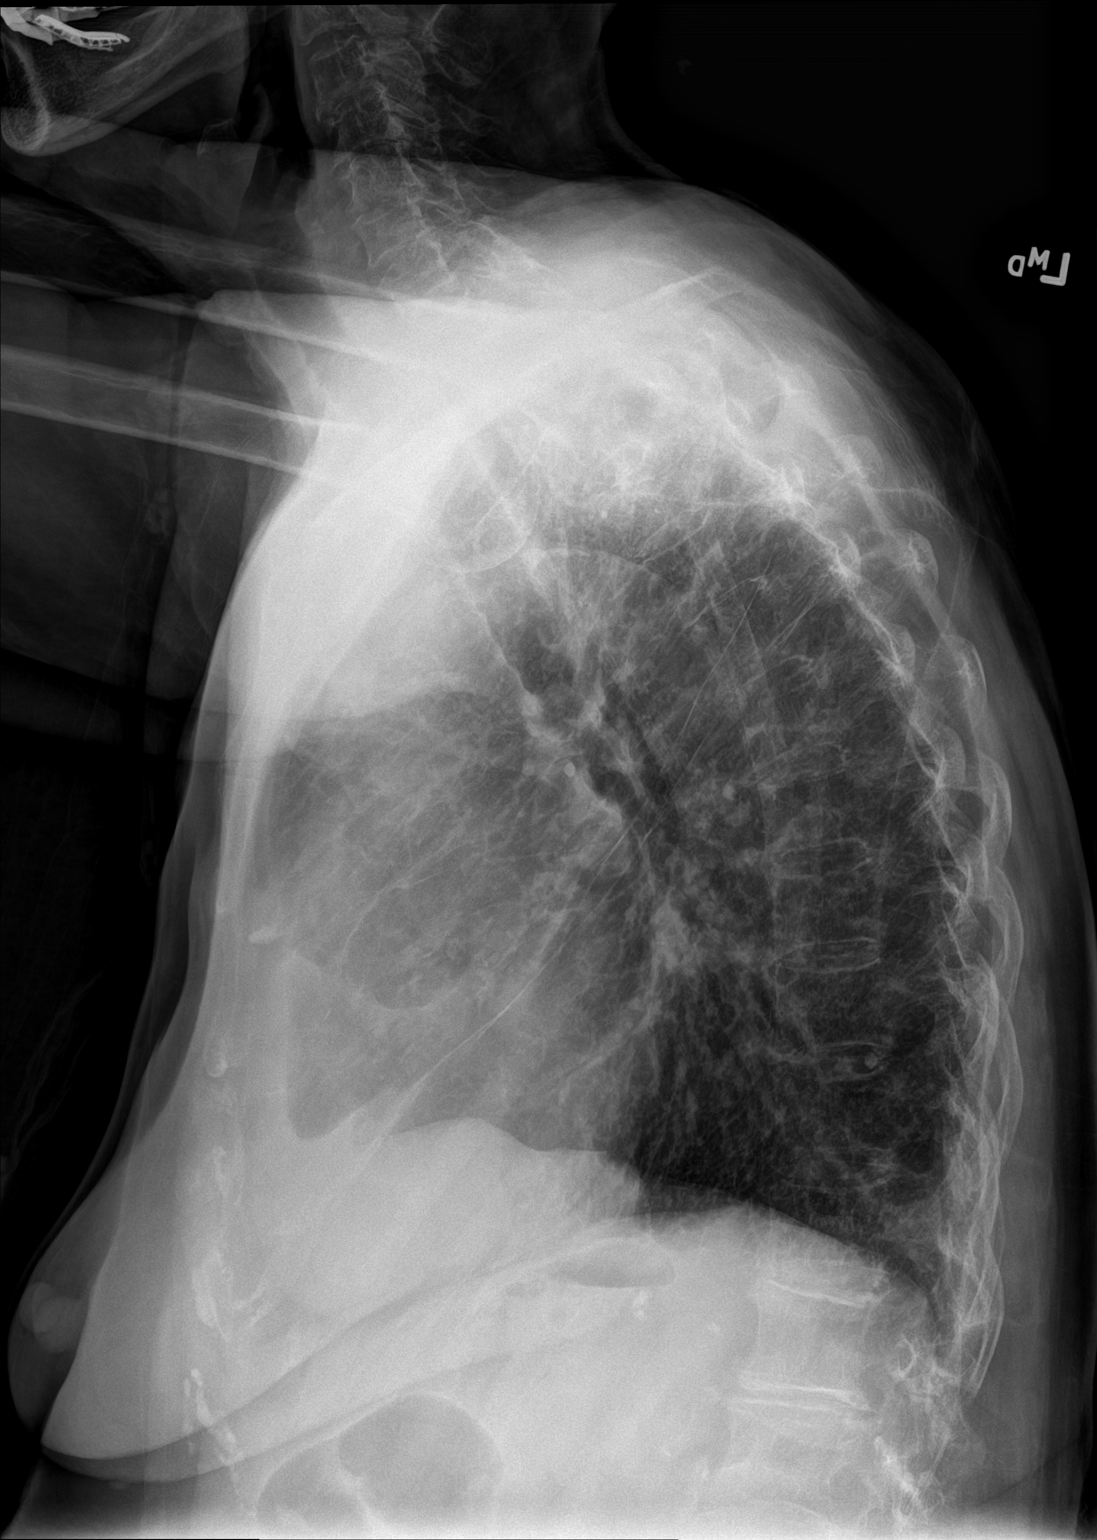

[2 of 2 positions shown; findings below may reference images not displayed]

FINDINGS: There is hyperinflation of the lungs compatible with COPD. Mild
cardiomegaly. No confluent airspace opacities or effusions. Biapical
scarring. No acute bony abnormality.
IMPRESSION: Hyperinflation, cardiomegaly.

No active disease.

## 2020-08-11 NOTE — ED Provider Notes (Signed)
Emergency Medicine Provider Triage Evaluation Note  Renee Blake , a 87 y.o. female  was evaluated in triage.  Pt complains of several days of back pain.  Patient reports bilateral lower back pain and rib pain with worse with movement.  She has not been taking anything for her symptoms.  She was seen on 4/30 with pyelonephritis, she reports compliance with her antibiotics.  Patient did not endorse this, however per chart review with a telephone encounter with Dr. Melony Overly, daughter reports that the patient has been passing dark stools and has had diarrhea for the last 2 days.  No hematemesis.  He recommended evaluated in the ER  Review of Systems  Positive: As above Negative: As above  Physical Exam  BP 119/63 (BP Location: Right Arm)   Pulse (!) 53   Temp 97.6 F (36.4 C) (Oral)   Resp 16   Ht 5\' 5"  (1.651 m)   Wt 54 kg   SpO2 99%   BMI 19.81 kg/m  Gen:   Awake, no distress   Resp:  Normal effort  MSK:   Moves extremities without difficulty, bilateral flank/paraspinal muscle tenderness of the low back as well as left-sided rib tenderness. Other:    Medical Decision Making  Medically screening exam initiated at 10:27 AM.  Appropriate orders placed.  [name] was informed that the remainder of the evaluation will be completed by another provider, this initial triage assessment does not replace that evaluation, and the importance of remaining in the ED until their evaluation is complete.     Garald Balding, PA-C 08/11/20 1029    Elnora Morrison, MD 08/12/20 [phone]

## 2020-08-11 NOTE — ED Provider Notes (Signed)
[facility] EMERGENCY DEPARTMENT Provider Note   CSN: [id number] Arrival date & time: 08/11/20  4696     History Chief Complaint  Patient presents with  . Back Pain    Renee Blake is a 87 y.o. female, with recent urinary retention/UTI finishing antibiotic course, history of chronic A. Fib, history of lower GI bleed in the setting of anorectal cancer s/p radiation therapy, hypertension, CKD stage IIIb, and T2DM presenting for evaluation of back pain and melena.   Her daughter is present with her today and provides additional history.  Patient reports an approximate 2-week history of black stools intermittently.  She has a known history of chronic diarrhea, however feels like she has had more episodes than usual in the past few days.  She has noticed some bright red blood on the tissue but not on or within the bowel movement.  She reports she has been more nauseous and feeling generally weak over the past 2-3 days.  Some generalized lower abdominal pain.  She denies any vomiting.  Drinking okay at home, eating less because of nausea.  She previously was seen by her GI specialist back in 01/2020 with rectal bleeding at that time.  They were considering undergoing repeat colonoscopy, however she opted not to proceed with this at the time because she did not want to go through the prep (worried she wouldn't make it to the restroom in time). Daughter reports similar symptoms when she was initially diagnosed with rectal cancer.  Not on iron or Pepto-Bismol.  Additionally she complains of bilateral lower back pain for the past several weeks to months.  Bothersome when she does certain activities.  Throbbing.  She is now urinating as normal, had Foley catheter removed on 5/9 after visiting with urology.  She is completed keflex yesterday evening. No further dysuria, now urinating as normal after catheter removal.  Denies any fever, chills, shortness of breath, chest pain, palpitations, lower  extremity swelling.  No recent falls or injury.  Denies any new bowel/bladder incontinence, saddle anesthesia, extremity localized weakness, or numbness.     Past Medical History:  Diagnosis Date  . Acute blood loss anemia   . Atrial fibrillation ([place])    a. diagnosed in 12/2018. Rate-control pursued given not a candidate for anticoagulation  . Chronic diarrhea   . Dilation of biliary tract   . Duodenal stricture   . Esophageal stricture   . Gastric ulcer   . GI bleed   . Hyperlipemia   . Hypertension   . Microscopic colitis   . Nephrolithiasis   . Pancreatic duct dilated   . Schatzki's ring   . Skin cancer   . Sliding hiatal hernia   . Vertigo     Patient Active Problem List   Diagnosis Date Noted  . Thyroid disease 07/25/2020  . Acute pulmonary edema (HCC)   . Longstanding persistent atrial fibrillation ([place])   . Palliative care by specialist   . Encounter for hospice care discussion   . Protein-calorie malnutrition, severe 04/27/2019  . Acute on chronic diastolic (congestive) heart failure ([place]) 04/27/2019  . Normocytic anemia 04/26/2019  . Goals of care, counseling/discussion   . Palliative care encounter   . Hypotension 04/17/2019  . Debility 03/16/2019  . Shortness of breath 03/16/2019  . Cachexia ([place]) 03/16/2019  . Permanent atrial fibrillation ([place])   . Hypoxia 03/15/2019  . CHF exacerbation ([place]) 03/14/2019  . Bilateral pleural effusion 03/04/2019  . Chronic atrial fibrillation ([place]) 03/04/2019  .  DNR (do not resuscitate) 03/04/2019  . Acute on chronic diastolic congestive heart failure ([place]) 03/02/2019  . Chronic diastolic heart failure ([place]) 03/02/2019  . Chronic renal failure, stage 3b ([place]) 03/02/2019  . Acute respiratory failure with hypoxia ([place]) 03/02/2019  . Acute exacerbation of CHF (congestive heart failure) ([place]) 01/27/2019  . Atrial fibrillation with RVR ([place]) 01/27/2019  . Iron deficiency anemia due to chronic blood loss---H/o Gi Bleed  01/27/2019  . Vaginal mass 07/29/2017  . Heme positive stool 07/25/2017  . Cellulitis 12/10/2016  . Facial cellulitis   . Erroneous encounter - disregard 03/17/2015  . AKI (acute kidney injury) ([place])   . Metabolic acidosis 0000000  . Acute renal failure syndrome ([place])   . Renal failure 08/22/2014  . Hyperkalemia 08/22/2014  . UTI (urinary tract infection) 08/22/2014  . Acute renal failure ([place]) 08/22/2014  . Dyspnea 03/15/2014  . Type 2 diabetes mellitus ([place]) 06/08/2013  . Acute diastolic congestive heart failure ([place]) 06/07/2013  . Syncope 03/26/2013  . Rectal bleeding 03/26/2013  . Dehydration 03/26/2013  . Generalized weakness 03/26/2013  . Malnutrition of moderate degree ([place]) 03/01/2013  . Diarrhea 02/28/2013  . Loss of weight 02/28/2013  . H/o Prior duodenal ulcer with hemorrhage 02/27/2013  . H/o Prior Lower GI bleed (anorectal Ca) 02/26/2013  . Acute blood loss anemia 02/26/2013  . UTI (lower urinary tract infection) 02/26/2013  . Hypertension 02/26/2013    Past Surgical History:  Procedure Laterality Date  . BIOPSY  07/28/2017   Procedure: BIOPSY;  Surgeon: Rogene Houston, MD;  Location: [facility] ENDO SU[facility];  Service: Endoscopy;;  rectum  . COLONOSCOPY N/A 07/28/2017   Procedure: COLONOSCOPY;  Surgeon: Rogene Houston, MD;  Location: [facility] ENDO SUITE;  Service: Endoscopy;  Laterality: N/A;  . ESOPHAGOGASTRODUODENOSCOPY N/A 02/27/2013   Procedure: ESOPHAGOGASTRODUODENOSCOPY (EGD);  Surgeon: Ladene Artist, MD;  Location: [facility] ENDOSCOPY;  Service: Endoscopy;  Laterality: N/A;  . ESOPHAGOGASTRODUODENOSCOPY N/A 03/28/2013   Procedure: ESOPHAGOGASTRODUODENOSCOPY (EGD);  Surgeon: Rogene Houston, MD;  Location: [facility] ENDO [facility];  Service: Endoscopy;  Laterality: N/A;  . HOT HEMOSTASIS N/A 02/27/2013   Procedure: HOT HEMOSTASIS (ARGON PLASMA COAGULATION/BICAP);  Surgeon: Ladene Artist, MD;  Location[facility] ENDOSCOPY;  Service: Endoscopy;  Laterality: N/A;  . NOSE SURGERY     for  skin cancer  . POLYPECTOMY  07/28/2017   Procedure: POLYPECTOMY;  Surgeon: Rogene Houston, MD;  Location: [facility] ENDO SUITE;  Service: Endoscopy;;  colon      OB History    Gravida  6   Para  6   Term  6   Preterm      AB      Living  6     SAB      IAB      Ectopic      Multiple      Live Births              Family History  Problem Relation Age of Onset  . Heart attack Father     Social History   Tobacco Use  . Smoking status: Never Smoker  . Smokeless tobacco: Never Used  Vaping Use  . Vaping Use: Never used  Substance Use Topics  . Alcohol use: No    Alcohol/week: 0.0 standard drinks  . Drug use: No    Home Medications Prior to Admission medications   Medication Sig Start Date End Date Taking? Authorizing Provider  acetaminophen (TYLENOL) 500 MG tablet Take 1,000 mg by  mouth every 6 (six) hours as needed.   Yes [provider]  ALPRAZolam (XANAX) 0.25 MG tablet Take 1 tablet (0.25 mg total) by mouth 2 (two) times daily as needed. for anxiety 04/20/19  Yes Manuella Ghazi, Pratik D, DO  diltiazem (CARDIZEM SR) 90 MG 12 hr capsule Take 1 capsule (90 mg total) by mouth 2 (two) times daily. 05/17/20  Yes Verta Ellen., NP  levothyroxine (SYNTHROID) 50 MCG tablet Take 50 mcg by mouth daily. 07/11/20  Yes [provider]  loperamide (IMODIUM A-D) 2 MG tablet Take 2 mg by mouth daily as needed for diarrhea or loose stools.   Yes [provider]  metoprolol succinate (TOPROL-XL) 25 MG 24 hr tablet Take 1 tablet (25 mg total) by mouth daily. 05/17/20 08/15/20 Yes Verta Ellen., NP  ondansetron (ZOFRAN) 4 MG tablet Take 1 tablet (4 mg total) by mouth every 8 (eight) hours as needed for nausea or vomiting. 03/27/20  Yes Hayden Rasmussen, MD  potassium chloride SA (KLOR-CON) 20 MEQ tablet Take 1 tablet (20 mEq total) by mouth daily. 05/17/20  Yes Verta Ellen., NP  torsemide (DEMADEX) 20 MG tablet Take 20 mg by mouth daily. TAKE 2  TABLETS IN THE MORNING AND 1 AND 1/2 TABLETS IN THE EVENING   Yes [provider]  traMADol (ULTRAM) 50 MG tablet Take 50 mg by mouth every 6 (six) hours as needed. 06/29/20  Yes [provider]    Allergies    Clonazepam, Codeine, and Sulfa antibiotics  Review of Systems   Review of Systems  Constitutional: Positive for appetite change. Negative for diaphoresis, fatigue and fever.  HENT: Negative for congestion.   Respiratory: Negative for cough, chest tightness, shortness of breath and wheezing.   Cardiovascular: Negative for chest pain, palpitations and leg swelling.  Gastrointestinal: Positive for abdominal pain, blood in stool, diarrhea and nausea. Negative for abdominal distention, constipation, rectal pain and vomiting.  Genitourinary: Negative for decreased urine volume, difficulty urinating, dysuria, flank pain and frequency.  Musculoskeletal: Positive for back pain.  Skin: Negative for rash and wound.  Neurological: Negative for dizziness, weakness, light-headedness and numbness.    Physical Exam Updated Vital Signs BP 99/60 (BP Location: Right Arm)   Pulse 61   Temp 97.9 F (36.6 C) (Oral)   Resp 16   Ht 5\' 5"  (1.651 m)   Wt 54 kg   SpO2 100%   BMI 19.81 kg/m   Physical Exam Constitutional:      General: She is not in acute distress.    Appearance: Normal appearance. She is not ill-appearing or toxic-appearing.  HENT:     Head: Normocephalic and atraumatic.     Mouth/Throat:     Comments: MM slightly dry  Eyes:     Extraocular Movements: Extraocular movements intact.  Cardiovascular:     Rate and Rhythm: Rhythm irregular.     Pulses: Normal pulses.  Pulmonary:     Effort: Pulmonary effort is normal. No respiratory distress.     Comments: Occasional rales  Abdominal:     General: Bowel sounds are normal. There is no distension.     Palpations: Abdomen is soft.     Tenderness: There is abdominal tenderness. There is no right CVA  tenderness, left CVA tenderness, guarding or rebound.     Comments: Some tenderness to LLQ and suprapubic region   Genitourinary:    Comments: External hemorrhoids present at 3 o'clock position, do not appear thrombosed  and nontender to palpation.  Rectal exam without palpation of mass, brown stool retrieved.  FOBT negative.  Chaperoned by RN. Musculoskeletal:     Comments: Lumbar spine: - Inspection: no gross deformity or asymmetry, swelling or ecchymosis - Palpation: No TTP over the spinous processes, however tender to paraspinal muscles  - ROM: full active ROM of the lumbar spine in flexion and extension - Strength: 5/5 strength of lower extremity in L4-S1 nerve root distributions b/l - Neuro: sensation intact in the L4-S1 nerve root distribution b/l, 2+ L4 and S1 reflexes - Special testing: Negative straight leg raise  Neurological:     Mental Status: She is alert.     ED Results / Procedures / Treatments   Labs (all labs ordered are listed, but only abnormal results are displayed) Labs Reviewed  CBC WITH DIFFERENTIAL/PLATELET - Abnormal; Notable for the following components:      Result Value   RBC 3.66 (*)    Hemoglobin 11.1 (*)    HCT 35.0 (*)    All other components within normal limits  COMPREHENSIVE METABOLIC PANEL - Abnormal; Notable for the following components:   Glucose, Bld 100 (*)    BUN 26 (*)    Creatinine, Ser 1.46 (*)    Calcium 8.8 (*)    GFR, Estimated 34 (*)    All other components within normal limits  URINALYSIS, ROUTINE W REFLEX MICROSCOPIC  POC OCCULT BLOOD, ED    EKG None  Radiology DG Chest 2 View  Result Date: 08/11/2020 CLINICAL DATA:  Low back pain EXAM: CHEST - 2 VIEW COMPARISON:  07/25/2020 FINDINGS: There is hyperinflation of the lungs compatible with COPD. Mild cardiomegaly. No confluent airspace opacities or effusions. Biapical scarring. No acute bony abnormality. IMPRESSION: Hyperinflation, cardiomegaly. No active disease.  Electronically Signed   By: Rolm Baptise M.D.   On: 08/11/2020 10:59   DG Lumbar Spine Complete  Result Date: 08/11/2020 CLINICAL DATA:  Low back pain.  No known injury. EXAM: LUMBAR SPINE - COMPLETE 4+ VIEW COMPARISON:  None. FINDINGS: Moderate leftward scoliosis centered in the mid lumbar spine. Diffuse degenerative disc and facet disease. No fracture. SI joints symmetric and unremarkable. IMPRESSION: Moderate leftward scoliosis and degenerative changes throughout the lumbar spine. No acute bony abnormality. Electronically Signed   By: Rolm Baptise M.D.   On: 08/11/2020 10:59    Procedures Procedures   Medications Ordered in ED Medications - No data to display  ED Course  I have reviewed the triage vital signs and the nursing notes.  Pertinent labs & imaging results that were available during my care of the patient were reviewed by me and considered in my medical decision making (see chart for details).    MDM Rules/Calculators/A&P                          87 year old female, with a history of rectal cancer s/p radiation and recent UTI completing Keflex yesterday evening, presenting for evaluation of chronic low back pain and 2-week history of dark stools.   Overall she is afebrile and hemodynamically stable. Mild nonspecific abdominal pain without any rebounding or guarding.  Reviewed recent CT renal study on 4/30 without acute intra-abdominal findings, which was performed after onset of reported melena.  She is not on any blood thinners.  FOBT negative with brown stool on exam.  Hemoglobin at baseline, 11.1 today from 11.2 two weeks ago.  No evidence for active GI bleed at this time,  however could consider occult with possible recurrence of rectal cancer, postradiation proctitis (less likely), GI ulcer.  Recommended follow-up with her GI provider for further evaluation.  Suspect that her recent nausea and worsening diarrhea is in the setting of recent antibiotic use for UTI, anticipate  improvement as this course has been completed.  No additional leukocytosis, electrolyte derangement, or new renal/liver impairment to suggest contribution.  In terms of her back pain, this appears to be chronic. No CVA tenderness or remaining urinary sx.  Lumbar x-ray showing extensive degenerative changes which is the likely etiology.  Preserved strength and neurologically intact.  Recommended heat and Tylenol as needed.  Provided reassurance to patient and mother.  Zofran as needed.  She is stable for discharge home from the ED with close PCP/GI follow-up.  ED precautions strictly discussed.  Final Clinical Impression(s) / ED Diagnoses Final diagnoses:  Chronic bilateral low back pain without sciatica  Dark stools    Rx / DC Orders ED Discharge Orders    None       Patriciaann Clan, DO 08/11/20 1706    Elnora Morrison, MD 08/12/20 [phone]

## 2020-08-11 NOTE — Discharge Instructions (Signed)
It was wonderful to see you today.  I am so sorry that you have not been feeling well the last few days.   Your chest x-ray looks clear.  Your lumbar x-rays did show a lot of arthritis which may be contributing to your chronic back pain.  Fortunately your hemoglobin is about the same as it previously was.  Summary of your recent symptoms may have been from your recent medication change which fortunately you have completed.  Please make sure you continue to drink small sips of fluid throughout the day to stay hydrated.  You can use your antinausea medicine that you had refilled to help with the nausea so that you can make sure that you are eating well.  Hopefully you start feeling better in the next few days.  Please call your GI doctor today to schedule follow-up appointment.  Return to the ED if you are feeling short of breath, chest pain, significant lightheadedness/dizziness, or recurrent bloody stools.

## 2020-08-11 NOTE — ED Triage Notes (Signed)
Pt presents to ED with complaints lower back pain x 2-3 days, nauseated.

## 2020-08-14 ENCOUNTER — Telehealth: Payer: Self-pay | Admitting: Family Medicine

## 2020-08-14 MED ORDER — TORSEMIDE 20 MG PO TABS: ORAL_TABLET | ORAL | 3 refills | Status: DC

## 2020-08-14 NOTE — Telephone Encounter (Signed)
[facility] is needing a new Rx for the Torsemide sent to   [facility] [place] [address]  [place], [place] [address]  Phone:  [phone] Fax:  [phone]

## 2020-08-15 ENCOUNTER — Encounter: Payer: Self-pay | Admitting: Urology

## 2020-08-15 NOTE — Patient Instructions (Signed)
Acute Urinary Retention, Female  Acute urinary retention is when a person cannot pee (urinate) at all, or can only pee a little. This can come on all of a sudden. If it is not treated, it can lead to kidney problems or other serious problems. What are the causes?  A problem with the tube that drains the bladder (urethra).  Problems with the nerves in the bladder.  The organs in the area between your hip bones (pelvis) slipping out of place (prolapse).  Tumors.  The birth of a baby through the vagina.  An infection.  Having trouble pooping (constipation).  Certain medicines. What increases the risk? Women over age 50 are more at risk. Other conditions also can increase risk. These include:  Diseases, such as multiple sclerosis.  Injury to the spinal cord.  Diabetes.  A condition that affects the way the brain works, such as dementia.  Holding back urine due to trauma or because you do not want to use the bathroom.  History of not being able to pee or peeing too little.  Having had surgery in the area between your hip bones. What are the signs or symptoms?  Trouble peeing.  Pain in the lower belly. How is this treated? Treatment for this condition may include:  Medicines.  Placing a thin, germ-free tube (catheter) into the bladder to drain pee out of the body.  Therapy to treat mental health conditions.  Treatment for conditions that may cause this. If needed, you may be treated in the hospital for kidney problems or to manage other problems. Follow these instructions at home: Medicines  Take over-the-counter and prescription medicines only as told by your doctor. Ask your doctor what medicines you should stay away from.  If you were given an antibiotic medicine, take it as told by your doctor. Do not stop taking it even if you start to feel better. General instructions  Do not smoke or use any products that contain nicotine or tobacco. If you need help  quitting, ask your doctor.  Drink enough fluid to keep your pee pale yellow.  If you were sent home with a tube that drains the bladder, take care of it as told by your doctor.  Watch for changes in your symptoms. Tell your doctor about them.  If told, keep track of changes in your blood pressure at home. Tell your doctor about them.  Keep all follow-up visits. Contact a doctor if:  You have spasms in your bladder that you cannot stop.  You leak pee when you have spasms. Get help right away if:  You have chills or a fever.  You have blood in your pee.  You have a tube that drains pee from the bladder and these things happen: ? The tube stops draining pee. ? The tube falls out. Summary  Acute urinary retention is when you cannot pee at all or you pee too little.  If this is not treated, it can cause kidney problems or other serious problems.  If you were sent home with a tube (catheter) that drains pee from the bladder, take care of it as told by your doctor.  Watch for changes in your symptoms. Tell your doctor about them. This information is not intended to replace advice given to you by your health care provider. Make sure you discuss any questions you have with your health care provider. Document Revised: 12/08/2019 Document Reviewed: 12/08/2019 Elsevier Patient Education  2021 [employer]  

## 2020-08-29 DIAGNOSIS — J9621 Acute and chronic respiratory failure with hypoxia: Secondary | ICD-10-CM | POA: Diagnosis not present

## 2020-08-29 NOTE — Progress Notes (Signed)
Cardiology Office Note  Date: 08/30/2020   ID: Renee Blake, DOB 02-06-1929, MRN [id number]  PCP:  [name], MD  Cardiologist:  None Electrophysiologist:  None   Chief Complaint: Follow-up chronic diastolic heart failure  History of Present Illness: Renee Blake is a 87 y.o. female with a history of chronic diastolic heart failure, PAF, anemia, CKD stage III, history of GIB   Recent presentation and admission on 07/25/2020 for decompensated heart failure.  She was diuresed with IV Lasix and had good urine output.  She had been compliant with home medications, salt restrictions and fluid intake.  Her home dose of torsemide was increased to 60 mg in a.m. and 30 mg in p.m.  Her atrial fibrillation was rate controlled.  Was currently on Cardizem and metoprolol.  She was not on anticoagulation due to history of previous bleeding in the past.  3 days later she presented to ED with complaints of inability to urinate.  She was diagnosed with a urinary tract infection, urinary retention, acute cystitis without hematuria, hyponatremia.  Keflex was started in the emergency room and Foley catheter placed.  Plan was for follow-up with urology and void trial in office after antibiotics.  She was here last visit status post hospital admission.  She denied any shortness of breath, weight gain. Weight was 119 pounds.  Denied any lower extremity edema.  Her torsemide was increased to 60 mg a.m. and 30 mg p.m.  Recent lab work on 07/29/2020 with potassium of 3.4, sodium 138, glucose 100, creatinine 1.49, GFR 33.  Hemoglobin 11.2, hematocrit 35.9.  She still had an indwelling Foley catheter secondary to urinary retention after discovery of UTI.  Daughter stated plan was to have it removed later that day.  She denied any shortness of breath. Echocardiogram on 07/26/2020 demonstrated EF of 65-70.  No WMA's.  LV diastolic parameters indeterminate.  Mildly elevated pulmonary systolic pressure 25.4 mmHg.  Mild  to moderate MR and TR.  She is here today for 2-week follow-up.  She states she is feeling relatively well.  Blood pressure 111/60 today.  She denies any shortness of breath, weight gain, or lower extremity edema. At last visit we decreased a.m. torsemide to 40 mg and p.m. dose to 30 mg.  Recent renal function labs on 08/11/2020 showed creatinine of 1.46 and GFR 34.  Denies any anginal or exertional symptoms.  She has actually lost around 3 pounds since last visit.  Today she weighs 116 pounds.   Past Medical History:  Diagnosis Date  . Acute blood loss anemia   . Atrial fibrillation ([place])    a. diagnosed in 12/2018. Rate-control pursued given not a candidate for anticoagulation  . Chronic diarrhea   . Dilation of biliary tract   . Duodenal stricture   . Esophageal stricture   . Gastric ulcer   . GI bleed   . Hyperlipemia   . Hypertension   . Microscopic colitis   . Nephrolithiasis   . Pancreatic duct dilated   . Schatzki's ring   . Skin cancer   . Sliding hiatal hernia   . Vertigo     Past Surgical History:  Procedure Laterality Date  . BIOPSY  07/28/2017   Procedure: BIOPSY;  Surgeon: Rogene Houston, MD;  [facility];  Service: Endoscopy;;  rectum  . COLONOSCOPY N/A 07/28/2017   Procedure: COLONOSCOPY;  Surgeon: Rogene Houston, MD;  [facility];  Service: Endoscopy;  Laterality: N/A;  .  ESOPHAGOGASTRODUODENOSCOPY N/A 02/27/2013   Procedure: ESOPHAGOGASTRODUODENOSCOPY (EGD);  Surgeon: Ladene Artist, MD;  Location: [facility] ENDOSCOPY;  Service: Endoscopy;  Laterality: N/A;  . ESOPHAGOGASTRODUODENOSCOPY N/A 03/28/2013   Procedure: ESOPHAGOGASTRODUODENOSCOPY (EGD);  Surgeon: Rogene Houston, MD;  [facility];  Service: Endoscopy;  Laterality: N/A;  . HOT HEMOSTASIS N/A 02/27/2013   Procedure: HOT HEMOSTASIS (ARGON PLASMA COAGULATION/BICAP);  Surgeon: Ladene Artist, MD;  Location: [facility] ENDOSCOPY;  Service: Endoscopy;  Laterality: N/A;  . NOSE  SURGERY     for skin cancer  . POLYPECTOMY  07/28/2017   Procedure: POLYPECTOMY;  Surgeon: Rogene Houston, MD;  [facility];  Service: Endoscopy;;  colon     Current Outpatient Medications  Medication Sig Dispense Refill  . acetaminophen (TYLENOL) 500 MG tablet Take 1,000 mg by mouth every 6 (six) hours as needed.    . ALPRAZolam (XANAX) 0.25 MG tablet Take 1 tablet (0.25 mg total) by mouth 2 (two) times daily as needed. for anxiety 8 tablet 0  . diltiazem (CARDIZEM SR) 90 MG 12 hr capsule Take 1 capsule (90 mg total) by mouth 2 (two) times daily. 180 capsule 1  . levothyroxine (SYNTHROID) 50 MCG tablet Take 50 mcg by mouth daily.    Marland Kitchen loperamide (IMODIUM A-D) 2 MG tablet Take 2 mg by mouth daily as needed for diarrhea or loose stools.    . metoprolol succinate (TOPROL-XL) 25 MG 24 hr tablet Take 1 tablet (25 mg total) by mouth daily. 90 tablet 1  . ondansetron (ZOFRAN) 4 MG tablet Take 1 tablet (4 mg total) by mouth every 8 (eight) hours as needed for nausea or vomiting. 20 tablet 0  . potassium chloride SA (KLOR-CON) 20 MEQ tablet Take 1 tablet (20 mEq total) by mouth daily. 90 tablet 1  . torsemide (DEMADEX) 20 MG tablet TAKE 2 TABLETS IN THE MORNING AND 1 AND 1/2 TABLETS IN THE EVENING 135 tablet 3  . traMADol (ULTRAM) 50 MG tablet Take 50 mg by mouth every 6 (six) hours as needed.     No current facility-administered medications for this visit.   Allergies:  Clonazepam, Codeine, and Sulfa antibiotics   Social History: The patient  reports that she has never smoked. She has never used smokeless tobacco. She reports that she does not drink alcohol and does not use drugs.   Family History: The patient's family history includes Heart attack in her father.   ROS:  Please see the history of present illness. Otherwise, complete review of systems is positive for none.  All other systems are reviewed and negative.   Physical Exam: VS:  BP 111/60   Pulse 60   Ht 5\' 6"  (1.676  m)   Wt 116 lb 9.6 oz (52.9 kg)   SpO2 99%   BMI 18.82 kg/m , BMI Body mass index is 18.82 kg/m.  Wt Readings from Last 3 Encounters:  08/30/20 116 lb 9.6 oz (52.9 kg)  08/11/20 119 lb 0.8 oz (54 kg)  08/07/20 119 lb (54 kg)    General: Patient appears comfortable at rest. Lungs: Clear to auscultation, nonlabored breathing at rest. Cardiac: irregularly irregular rate and rhythm, no S3 or significant systolic murmur, no pericardial rub. Extremities: No pitting edema, distal pulses 2+. Skin: Warm and dry. Musculoskeletal: No kyphosis. Neuropsychiatric: Alert and oriented x3, affect grossly appropriate.  ECG:  EKG 10/18/2019 showed atrial fibrillation rate of 74 right bundle branch block and left anterior fascicular block.  Recent Labwork: 07/25/2020: B  Natriuretic Peptide 301.0 07/26/2020: Magnesium 2.0; TSH 0.372 08/11/2020: ALT 33; AST 22; BUN 26; Creatinine, Ser 1.46; Hemoglobin 11.1; Platelets 279; Potassium 3.7; Sodium 135  No results found for: CHOL, TRIG, HDL, CHOLHDL, VLDL, LDLCALC, LDLDIRECT     Other Studies Reviewed Today:  Echocardiogram 07/26/2020 1. Left ventricular ejection fraction, by estimation, is 65 to 70%. The left ventricle has normal function. The left ventricle has no regional wall motion abnormalities. Left ventricular diastolic parameters are indeterminate. 2. Right ventricular systolic function is normal. The right ventricular size is normal. There is mildly elevated pulmonary artery systolic pressure. The estimated right ventricular systolic pressure is 41.6 mmHg. 3. Left atrial size was upper normal. 4. Right atrial size was upper normal. 5. There is a trivial pericardial effusion posterior to the left ventricle. 6. The mitral valve is grossly normal. Mild to moderate mitral valve regurgitation. 7. Tricuspid valve regurgitation is mild to moderate. 8. The aortic valve is tricuspid. Aortic valve regurgitation is not visualized. 9. The inferior vena  cava is normal in size with greater than 50% respiratory variability, suggesting right atrial pressure of 3 mmHg.   Echocardiogram 04/07/2019 at [place]  1. Technically difficult study due to patient position.  2. The left ventricle is normal in size with mildly increased wall  thickness.  3. The left ventricular systolic function is normal, LVEF is visually  estimated at 60-65%.  4. The left atrium is severely dilated in size.  5. There is severe mostly posteriorly directed mitral regurgitation.  6. The right ventricle is normal in size, with normal systolic function.  7. The right atrium is moderately to severely dilated in size.  8. There is moderate to severe tricuspid regurgitation.  9. There is mild-moderate pulmonary hypertension, estimated pulmonary artery  systolic pressure is 48 mmHg.    Echocardiogram: 12/2018 IMPRESSIONS  1. Left ventricular ejection fraction, by visual estimation, is 60 to  65%. The left ventricle has normal function. There is no left ventricular  hypertrophy.  2. Elevated left atrial pressure.  3. Left ventricular diastolic parameters are consistent with Grade II  diastolic dysfunction (pseudonormalization).  4. Global right ventricle has normal systolic function.The right  ventricular size is normal. No increase in right ventricular wall  thickness.  5. Left atrial size was severely dilated.  6. Right atrial size was mildly dilated.  7. The mitral valve is abnormal. Moderate mitral valve regurgitation. No  evidence of mitral stenosis.  8. The MR vena contracta is 0.6 cm. The MV/AV TVI ratio is 1.1. Findings  support moderate mitral regurgitation.  9. The tricuspid valve is normal in structure. Tricuspid valve  regurgitation is mild.  10. The aortic valve is tricuspid. Aortic valve regurgitation is not  visualized. No evidence of aortic valve sclerosis or stenosis.  11. The pulmonic valve was not well  visualized. Pulmonic valve  regurgitation is mild.  12. Moderately elevated pulmonary artery systolic pressure.  13. The inferior vena cava is normal in size with greater than 50%  respiratory variability, suggesting right atrial pressure of 3 mmHg.   Assessment and Plan:   1. Chronic diastolic heart failure (HCC) Denies any shortness of breath or dyspnea on exertion, PND, orthopnea, weight gain, lower extremity edema.  Decrease torsemide to 40 mg p.o. daily only.  Continue metoprolol 25 mg daily.  Continue potassium supplementation.    2. Persistent atrial fibrillation ([place]) Continues in atrial fibrillation with rate 60.  Continue diltiazem SR 90 mg po bid .  Continue Toprol XL 25 mg daily. Not on anticoagulation d/t hx of GIB  3. Essential hypertension Blood pressure 111/60.  Continue Toprol-XL 25 mg daily.  We are decreasing torsemide to 40 mg daily only.   .  4. Anemia, unspecified type Labs from PCP 04/10/2020 demonstrated hemoglobin 11.8 and hematocrit of 36.1%  No complaints of active bleeding or black tarry stools.  5. Stage 3 chronic kidney disease, unspecified whether stage 3a or 3b CKD Recent labs on 08/11/2020 creatinine 1.46 and GFR 34.  Decrease Lasix to 40 mg p.o. daily only.  6.   Mitral regurgitation by prior echocardiogram Recent echocardiogram 07/26/2020 EF 65 to 70%.  No WMA's.  Mildly elevated PASP at 41.2 mmHg.  Mild to moderate MR, mild to moderate TR.   Medication Adjustments/Labs and Tests Ordered: Current medicines are reviewed at length with the patient today.  Concerns regarding medicines are outlined above.   Disposition: Follow-up with Dr. Harl Bowie or APP 6 months  Signed, Levell July, NP 08/30/2020 11:24 AM    [facility] [place], [place], [place] [address] Phone: [phone]; Fax: [phone]

## 2020-08-30 ENCOUNTER — Ambulatory Visit (INDEPENDENT_AMBULATORY_CARE_PROVIDER_SITE_OTHER): Payer: Medicare Other | Admitting: Family Medicine

## 2020-08-30 ENCOUNTER — Encounter: Payer: Self-pay | Admitting: Family Medicine

## 2020-08-30 VITALS — BP 111/60 | HR 60 | Ht 66.0 in | Wt 116.6 lb

## 2020-08-30 DIAGNOSIS — I340 Nonrheumatic mitral (valve) insufficiency: Secondary | ICD-10-CM

## 2020-08-30 DIAGNOSIS — I10 Essential (primary) hypertension: Secondary | ICD-10-CM | POA: Diagnosis not present

## 2020-08-30 DIAGNOSIS — N183 Chronic kidney disease, stage 3 unspecified: Secondary | ICD-10-CM

## 2020-08-30 DIAGNOSIS — I4819 Other persistent atrial fibrillation: Secondary | ICD-10-CM

## 2020-08-30 DIAGNOSIS — I5032 Chronic diastolic (congestive) heart failure: Secondary | ICD-10-CM

## 2020-08-30 DIAGNOSIS — D649 Anemia, unspecified: Secondary | ICD-10-CM | POA: Diagnosis not present

## 2020-08-30 MED ORDER — TORSEMIDE 20 MG PO TABS: 40.0000 mg | ORAL_TABLET | Freq: Every morning | ORAL | Status: DC

## 2020-08-30 NOTE — Patient Instructions (Addendum)
Medication Instructions:   Decrease Torsemide to 40mg  every morning.  Continue all current medications.  Labwork: none  Testing/Procedures: none  Follow-Up: 6 months   Any Other Special Instructions Will Be Listed Below (If Applicable).  If you need a refill on your cardiac medications before your next appointment, please call your pharmacy.

## 2020-09-01 ENCOUNTER — Other Ambulatory Visit: Payer: Self-pay

## 2020-09-01 ENCOUNTER — Ambulatory Visit (INDEPENDENT_AMBULATORY_CARE_PROVIDER_SITE_OTHER): Payer: Medicare Other | Admitting: Urology

## 2020-09-01 DIAGNOSIS — R339 Retention of urine, unspecified: Secondary | ICD-10-CM | POA: Diagnosis not present

## 2020-09-01 LAB — BLADDER SCAN AMB NON-IMAGING: Scan Result: 216

## 2020-09-01 NOTE — Progress Notes (Signed)
09/01/2020 11:21 AM   Renee Blake 1928-07-01 [id number]  Referring provider: [name], MD [address] [place],  [place] [address]  followup urinary retention  HPI: [name] is a 87yo here for followup for urinary retention. She has a PVR 216 but took her torsamide this morning. Urinary stream is strong. She denies urinary urgency or urinary frequency. No dysuria or hematuria. No other complaints today   PMH: Past Medical History:  Diagnosis Date   Acute blood loss anemia    Atrial fibrillation ([place])    a. diagnosed in 12/2018. Rate-control pursued given not a candidate for anticoagulation   Chronic diarrhea    Dilation of biliary tract    Duodenal stricture    Esophageal stricture    Gastric ulcer    GI bleed    Hyperlipemia    Hypertension    Microscopic colitis    Nephrolithiasis    Pancreatic duct dilated    Schatzki's ring    Skin cancer    Sliding hiatal hernia    Vertigo     Surgical History: Past Surgical History:  Procedure Laterality Date   BIOPSY  07/28/2017   Procedure: BIOPSY;  Surgeon: Rogene Houston, MD;  [facility];  Service: Endoscopy;;  rectum   COLONOSCOPY N/A 07/28/2017   Procedure: COLONOSCOPY;  Surgeon: Rogene Houston, MD;  [facility];  Service: Endoscopy;  Laterality: N/A;   ESOPHAGOGASTRODUODENOSCOPY N/A 02/27/2013   Procedure: ESOPHAGOGASTRODUODENOSCOPY (EGD);  Surgeon: Ladene Artist, MD;  Location[facility] ENDOSCOPY;  Service: Endoscopy;  Laterality: N/A;   ESOPHAGOGASTRODUODENOSCOPY N/A 03/28/2013   Procedure: ESOPHAGOGASTRODUODENOSCOPY (EGD);  Surgeon: Rogene Houston, MD;  [facility];  Service: Endoscopy;  Laterality: N/A;   HOT HEMOSTASIS N/A 02/27/2013   Procedure: HOT HEMOSTASIS (ARGON PLASMA COAGULATION/BICAP);  Surgeon: Ladene Artist, MD;  Location: [facility] ENDOSCOPY;  Service: Endoscopy;  Laterality: N/A;   NOSE SURGERY     for skin cancer   POLYPECTOMY  07/28/2017   Procedure:  POLYPECTOMY;  Surgeon: Rogene Houston, MD;  [facility];  Service: Endoscopy;;  colon     Home Medications:  Allergies as of 09/01/2020       Reactions   Clonazepam Other (See Comments)   Pruritis   Codeine Nausea And Vomiting, Other (See Comments)   Reaction unknown   Sulfa Antibiotics Other (See Comments)   Reactions unknown        Medication List        Accurate as of September 01, 2020 11:21 AM. If you have any questions, ask your nurse or doctor.          acetaminophen 500 MG tablet Commonly known as: TYLENOL Take 1,000 mg by mouth every 6 (six) hours as needed.   ALPRAZolam 0.25 MG tablet Commonly known as: XANAX Take 1 tablet (0.25 mg total) by mouth 2 (two) times daily as needed. for anxiety   diltiazem 90 MG 12 hr capsule Commonly known as: CARDIZEM SR Take 1 capsule (90 mg total) by mouth 2 (two) times daily.   levothyroxine 50 MCG tablet Commonly known as: SYNTHROID Take 50 mcg by mouth daily.   loperamide 2 MG tablet Commonly known as: IMODIUM A-D Take 2 mg by mouth daily as needed for diarrhea or loose stools.   metoprolol succinate 25 MG 24 hr tablet Commonly known as: TOPROL-XL Take 1 tablet (25 mg total) by mouth daily.   ondansetron 4 MG tablet  Commonly known as: Zofran Take 1 tablet (4 mg total) by mouth every 8 (eight) hours as needed for nausea or vomiting.   potassium chloride SA 20 MEQ tablet Commonly known as: KLOR-CON Take 1 tablet (20 mEq total) by mouth daily.   torsemide 20 MG tablet Commonly known as: DEMADEX Take 2 tablets (40 mg total) by mouth every morning.   traMADol 50 MG tablet Commonly known as: ULTRAM Take 50 mg by mouth every 6 (six) hours as needed.        Allergies:  Allergies  Allergen Reactions   Clonazepam Other (See Comments)    Pruritis   Codeine Nausea And Vomiting and Other (See Comments)    Reaction unknown   Sulfa Antibiotics Other (See Comments)    Reactions unknown    Family  History: Family History  Problem Relation Age of Onset   Heart attack Father     Social History:  reports that she has never smoked. She has never used smokeless tobacco. She reports that she does not drink alcohol and does not use drugs.  ROS: All other review of systems were reviewed and are negative except what is noted above in HPI  Physical Exam: There were no vitals taken for this visit.  Constitutional:  Alert and oriented, No acute distress. HEENT: [place] AT, moist mucus membranes.  Trachea midline, no masses. Cardiovascular: No clubbing, cyanosis, or edema. Respiratory: Normal respiratory effort, no increased work of breathing. GI: Abdomen is soft, nontender, nondistended, no abdominal masses GU: No CVA tenderness.  Lymph: No cervical or inguinal lymphadenopathy. Skin: No rashes, bruises or suspicious lesions. Neurologic: Grossly intact, no focal deficits, moving all 4 extremities. Psychiatric: Normal mood and affect.  Laboratory Data: Lab Results  Component Value Date   WBC 5.1 08/11/2020   HGB 11.1 (L) 08/11/2020   HCT 35.0 (L) 08/11/2020   MCV 95.6 08/11/2020   PLT 279 08/11/2020    Lab Results  Component Value Date   CREATININE 1.46 (H) 08/11/2020    No results found for: PSA  No results found for: TESTOSTERONE  Lab Results  Component Value Date   HGBA1C 5.9 (H) 08/23/2014    Urinalysis    Component Value Date/Time   COLORURINE STRAW (A) 07/29/2020 1720   APPEARANCEUR CLEAR 07/29/2020 1720   LABSPEC 1.004 (L) 07/29/2020 1720   PHURINE 6.0 07/29/2020 1720   GLUCOSEU NEGATIVE 07/29/2020 1720   HGBUR NEGATIVE 07/29/2020 1720   BILIRUBINUR NEGATIVE 07/29/2020 1720   KETONESUR NEGATIVE 07/29/2020 1720   PROTEINUR NEGATIVE 07/29/2020 1720   UROBILINOGEN 0.2 08/22/2014 1300   NITRITE POSITIVE (A) 07/29/2020 1720   LEUKOCYTESUR TRACE (A) 07/29/2020 1720    Lab Results  Component Value Date   BACTERIA RARE (A) 07/29/2020    Pertinent  Imaging:  No results found for this or any previous visit.  Results for orders placed during the hospital encounter of 06/07/13  US Venous Img Lower Bilateral  Narrative CLINICAL DATA:  Bilateral lower extremity edema  EXAM: BILATERAL LOWER EXTREMITY VENOUS DUPLEX ULTRASOUND  TECHNIQUE: Gray-scale sonography with graded compression, as well as color Doppler and duplex ultrasound, were performed to evaluate the deep venous system from the level of the common femoral vein through the popliteal and proximal calf veins. Spectral Doppler was utilized to evaluate flow at rest and with distal augmentation maneuvers.  COMPARISON:  None.  FINDINGS: Real-time and Doppler interrogation of both lower extremity venous systems was performed. Flow in the venous structures of both lower extremities  is spontaneous and phasic in all segments. There is normal compression and augmentation in the venous structures of both lower extremities. Venous Doppler signal is normal in all regions bilaterally. There is no thrombus in the deep or visualized superficial venous structures of either lower extremity. There is no deep venous incompetence in either lower extremity.  IMPRESSION: No evidence of deep venous thrombosis in either lower extremity.   Electronically Signed By: Lowella Grip M.D. On: 06/07/2013 15:33  No results found for this or any previous visit.  No results found for this or any previous visit.  No results found for this or any previous visit.  No results found for this or any previous visit.  No results found for this or any previous visit.  Results for orders placed during the hospital encounter of 07/29/20  CT Renal Stone Study  Narrative CLINICAL DATA:  Dysuria for 2 days, flank pain, history of anal cancer  EXAM: CT ABDOMEN AND PELVIS WITHOUT CONTRAST  TECHNIQUE: Multidetector CT imaging of the abdomen and pelvis was performed following the standard  protocol without IV contrast.  COMPARISON:  03/17/2019  FINDINGS: Lower chest: Trace left pleural effusion unchanged since prior exam. No acute airspace disease.  Hepatobiliary: No focal liver abnormality is seen. No gallstones, gallbladder wall thickening, or biliary dilatation.  Pancreas: Unremarkable. No pancreatic ductal dilatation or surrounding inflammatory changes.  Spleen: Normal in size without focal abnormality.  Adrenals/Urinary Tract: There is a stable right renal cortical cyst. 2 mm nonobstructing calculus lower pole right kidney unchanged. Bilateral extrarenal pelves are seen, right greater than left. No hydronephrosis.  The bladder is decompressed with a Foley catheter in place. The adrenals are unremarkable.  Stomach/Bowel: No bowel obstruction or ileus. Normal gas-filled appendix right lower quadrant. No inflammatory changes. Wall thickening at the anal verge may be post therapeutic, and is stable since prior study.  Vascular/Lymphatic: Aortic atherosclerosis. No enlarged abdominal or pelvic lymph nodes.  Reproductive: Uterus and bilateral adnexa are unremarkable.  Other: No free fluid or free gas.  No abdominal wall hernia.  Musculoskeletal: No acute or destructive bony lesions. Reconstructed images demonstrate no additional findings.  IMPRESSION: 1. Stable 2 mm nonobstructing right renal calculus. 2. Stable wall thickening at the anal verge, likely post therapeutic changes in this patient with history of anal cancer. 3. Stable trace left pleural effusion. 4.  Aortic Atherosclerosis (ICD10-I70.0).   Electronically Signed By: Randa Ngo M.D. On: 07/29/2020 18:31   Assessment & Plan:    1. Urinary retention -RTC 6 months with PVR - BLADDER SCAN AMB NON-IMAGING   No follow-ups on file.  Nicolette Bang, MD  [facility] [place]

## 2020-09-01 NOTE — Progress Notes (Signed)
Bladder Scan Patient can void: 216 ml Performed By: Estill Bamberg RN

## 2020-09-05 ENCOUNTER — Telehealth: Payer: Self-pay | Admitting: Family Medicine

## 2020-09-05 MED ORDER — TORSEMIDE 20 MG PO TABS: 40.0000 mg | ORAL_TABLET | Freq: Every morning | ORAL | 6 refills | Status: DC

## 2020-09-05 NOTE — Telephone Encounter (Signed)
*  STAT* If patient is at the pharmacy, call can be transferred to refill team.   1. Which medications need to be refilled? (please list name of each medication and dose if known)          torsemide (DEMADEX) 20 MG tablet ** was changed to just 2 in the AM removed 1  in the PM  2. Which pharmacy/location (including street and city if local pharmacy) is medication to be sent to? [facility] [place]  3. Do they need a 30 day or 90 day supply? 30 monthly

## 2020-09-05 NOTE — Telephone Encounter (Signed)
Medication refill request for Torsemide 20 mg approved and sent to pharmacy per pt request.

## 2020-09-12 ENCOUNTER — Encounter: Payer: Self-pay | Admitting: Urology

## 2020-09-12 NOTE — Patient Instructions (Signed)
Acute Urinary Retention, Female  Acute urinary retention is when a person cannot pee (urinate) at all, or can only pee a little. This can come on all of a sudden. If it isnot treated, it can lead to kidney problems or other serious problems. What are the causes? A problem with the tube that drains the bladder (urethra). Problems with the nerves in the bladder. The organs in the area between your hip bones (pelvis) slipping out of place (prolapse). Tumors. The birth of a baby through the vagina. An infection. Having trouble pooping (constipation). Certain medicines. What increases the risk? Women over age 85 are more at risk. Other conditions also can increase risk. These include: Diseases, such as multiple sclerosis. Injury to the spinal cord. Diabetes. A condition that affects the way the brain works, such as dementia. Holding back urine due to trauma or because you do not want to use the bathroom. History of not being able to pee or peeing too little. Having had surgery in the area between your hip bones. What are the signs or symptoms? Trouble peeing. Pain in the lower belly. How is this treated? Treatment for this condition may include: Medicines. Placing a thin, germ-free tube (catheter) into the bladder to drain pee out of the body. Therapy to treat mental health conditions. Treatment for conditions that may cause this. If needed, you may be treated in the hospital for kidney problems or to manageother problems. Follow these instructions at home: Medicines Take over-the-counter and prescription medicines only as told by your doctor. Ask your doctor what medicines you should stay away from. If you were given an antibiotic medicine, take it as told by your doctor. Do not stop taking it even if you start to feel better. General instructions Do not smoke or use any products that contain nicotine or tobacco. If you need help quitting, ask your doctor. Drink enough fluid to keep  your pee pale yellow. If you were sent home with a tube that drains the bladder, take care of it as told by your doctor. Watch for changes in your symptoms. Tell your doctor about them. If told, keep track of changes in your blood pressure at home. Tell your doctor about them. Keep all follow-up visits. Contact a doctor if: You have spasms in your bladder that you cannot stop. You leak pee when you have spasms. Get help right away if: You have chills or a fever. You have blood in your pee. You have a tube that drains pee from the bladder and these things happen: The tube stops draining pee. The tube falls out. Summary Acute urinary retention is when you cannot pee at all or you pee too little. If this is not treated, it can cause kidney problems or other serious problems. If you were sent home with a tube (catheter) that drains pee from the bladder, take care of it as told by your doctor. Watch for changes in your symptoms. Tell your doctor about them. This information is not intended to replace advice given to you by your health care provider. Make sure you discuss any questions you have with your healthcare provider. Document Revised: 12/08/2019 Document Reviewed: 12/08/2019 Elsevier Patient Education  2022 [employer].

## 2020-09-13 ENCOUNTER — Emergency Department (HOSPITAL_COMMUNITY)
Admission: EM | Admit: 2020-09-13 | Discharge: 2020-09-13 | Disposition: A | Discharge status: Home / Self Care | Payer: Medicare Other | Attending: Emergency Medicine | Admitting: Emergency Medicine

## 2020-09-13 ENCOUNTER — Encounter (HOSPITAL_COMMUNITY): Payer: Self-pay

## 2020-09-13 ENCOUNTER — Emergency Department (HOSPITAL_COMMUNITY): Payer: Medicare Other

## 2020-09-13 ENCOUNTER — Other Ambulatory Visit: Payer: Self-pay

## 2020-09-13 DIAGNOSIS — I130 Hypertensive heart and chronic kidney disease with heart failure and stage 1 through stage 4 chronic kidney disease, or unspecified chronic kidney disease: Secondary | ICD-10-CM | POA: Diagnosis not present

## 2020-09-13 DIAGNOSIS — E1122 Type 2 diabetes mellitus with diabetic chronic kidney disease: Secondary | ICD-10-CM | POA: Diagnosis not present

## 2020-09-13 DIAGNOSIS — R110 Nausea: Secondary | ICD-10-CM | POA: Diagnosis not present

## 2020-09-13 DIAGNOSIS — N390 Urinary tract infection, site not specified: Secondary | ICD-10-CM | POA: Diagnosis not present

## 2020-09-13 DIAGNOSIS — R0602 Shortness of breath: Secondary | ICD-10-CM | POA: Diagnosis not present

## 2020-09-13 DIAGNOSIS — Z20822 Contact with and (suspected) exposure to covid-19: Secondary | ICD-10-CM | POA: Insufficient documentation

## 2020-09-13 DIAGNOSIS — Z85828 Personal history of other malignant neoplasm of skin: Secondary | ICD-10-CM | POA: Insufficient documentation

## 2020-09-13 DIAGNOSIS — R069 Unspecified abnormalities of breathing: Secondary | ICD-10-CM | POA: Diagnosis not present

## 2020-09-13 DIAGNOSIS — N183 Chronic kidney disease, stage 3 unspecified: Secondary | ICD-10-CM | POA: Insufficient documentation

## 2020-09-13 DIAGNOSIS — I517 Cardiomegaly: Secondary | ICD-10-CM | POA: Diagnosis not present

## 2020-09-13 DIAGNOSIS — I5031 Acute diastolic (congestive) heart failure: Secondary | ICD-10-CM | POA: Diagnosis not present

## 2020-09-13 DIAGNOSIS — B9689 Other specified bacterial agents as the cause of diseases classified elsewhere: Secondary | ICD-10-CM | POA: Insufficient documentation

## 2020-09-13 LAB — URINALYSIS, ROUTINE W REFLEX MICROSCOPIC
Bilirubin Urine: NEGATIVE
Glucose, UA: NEGATIVE mg/dL
Hgb urine dipstick: NEGATIVE
Ketones, ur: NEGATIVE mg/dL
Nitrite: NEGATIVE
Protein, ur: NEGATIVE mg/dL
Specific Gravity, Urine: 1.003 — ABNORMAL LOW (ref 1.005–1.030)
pH: 6 (ref 5.0–8.0)

## 2020-09-13 LAB — COMPREHENSIVE METABOLIC PANEL
ALT: 14 U/L (ref 0–44)
AST: 17 U/L (ref 15–41)
Albumin: 3.8 g/dL (ref 3.5–5.0)
Alkaline Phosphatase: 93 U/L (ref 38–126)
Anion gap: 7 (ref 5–15)
BUN: 22 mg/dL (ref 8–23)
CO2: 27 mmol/L (ref 22–32)
Calcium: 9.1 mg/dL (ref 8.9–10.3)
Chloride: 101 mmol/L (ref 98–111)
Creatinine, Ser: 1.08 mg/dL — ABNORMAL HIGH (ref 0.44–1.00)
GFR, Estimated: 49 mL/min — ABNORMAL LOW (ref >60)
Glucose, Bld: 102 mg/dL — ABNORMAL HIGH (ref 70–99)
Potassium: 4.7 mmol/L (ref 3.5–5.1)
Sodium: 135 mmol/L (ref 135–145)
Total Bilirubin: 0.2 mg/dL — ABNORMAL LOW (ref 0.3–1.2)
Total Protein: 7.6 g/dL (ref 6.5–8.1)

## 2020-09-13 LAB — RESP PANEL BY RT-PCR (FLU A&B, COVID) ARPGX2
Influenza A by PCR: NEGATIVE
Influenza B by PCR: NEGATIVE
SARS Coronavirus 2 by RT PCR: NEGATIVE

## 2020-09-13 LAB — CBC WITH DIFFERENTIAL/PLATELET
Abs Immature Granulocytes: 0.03 10*3/uL (ref 0.00–0.07)
Basophils Absolute: 0 10*3/uL (ref 0.0–0.1)
Basophils Relative: 0 %
Eosinophils Absolute: 0.2 10*3/uL (ref 0.0–0.5)
Eosinophils Relative: 3 %
HCT: 37.2 % (ref 36.0–46.0)
Hemoglobin: 11.8 g/dL — ABNORMAL LOW (ref 12.0–15.0)
Immature Granulocytes: 0 %
Lymphocytes Relative: 16 %
Lymphs Abs: 1 10*3/uL (ref 0.7–4.0)
MCH: 30.3 pg (ref 26.0–34.0)
MCHC: 31.7 g/dL (ref 30.0–36.0)
MCV: 95.4 fL (ref 80.0–100.0)
Monocytes Absolute: 0.7 10*3/uL (ref 0.1–1.0)
Monocytes Relative: 10 %
Neutro Abs: 4.8 10*3/uL (ref 1.7–7.7)
Neutrophils Relative %: 71 %
Platelets: 252 10*3/uL (ref 150–400)
RBC: 3.9 MIL/uL (ref 3.87–5.11)
RDW: 13.4 % (ref 11.5–15.5)
WBC: 6.7 10*3/uL (ref 4.0–10.5)
nRBC: 0 % (ref 0.0–0.2)

## 2020-09-13 LAB — BRAIN NATRIURETIC PEPTIDE: B Natriuretic Peptide: 246 pg/mL — ABNORMAL HIGH (ref 0.0–100.0)

## 2020-09-13 LAB — TROPONIN I (HIGH SENSITIVITY)
Troponin I (High Sensitivity): 5 ng/L (ref <18)
Troponin I (High Sensitivity): 5 ng/L (ref <18)

## 2020-09-13 NOTE — Discharge Instructions (Addendum)
You were seen in the emergency room today with shortness of breath along with nausea.  Your lab work here is reassuring.  I am sending your urine for culture and we will call you if infection grows and you require antibiotics.  Please keep your follow-up appointment with your primary care doctor and continue your home medications as prescribed.  Return to the emergency department any new or suddenly worsening symptoms.

## 2020-09-13 NOTE — ED Triage Notes (Signed)
Pt presents to ED via [facility] for SOB with ambulation. Pt sat 96-97% on RA. Placed on 2L for comfort.

## 2020-09-13 NOTE — ED Provider Notes (Signed)
Emergency Department Provider Note   I have reviewed the triage vital signs and the nursing notes.   HISTORY  Chief Complaint Shortness of Breath   HPI Renee Blake is a 87 y.o. female with past medical history reviewed below including atrial fibrillation presents to the emergency department with intermittent nausea with shortness of breath.  Patient describes episodes of the symptoms which began as nausea without pain.  She denies any vomiting or diarrhea.  She states this then causes her to feel winded.  She denies chest pain or heart palpitations during these episodes.  She notes that symptoms are improving.  She was placed on 2 L nasal cannula oxygen for comfort although was never hypoxemic according to EMS.  She denies any back or flank pain.  No urinary tract infection symptoms.  No new medications.   Past Medical History:  Diagnosis Date   Acute blood loss anemia    Atrial fibrillation ([place])    a. diagnosed in 12/2018. Rate-control pursued given not a candidate for anticoagulation   Chronic diarrhea    Dilation of biliary tract    Duodenal stricture    Esophageal stricture    Gastric ulcer    GI bleed    Hyperlipemia    Hypertension    Microscopic colitis    Nephrolithiasis    Pancreatic duct dilated    Schatzki's ring    Skin cancer    Sliding hiatal hernia    Vertigo     Patient Active Problem List   Diagnosis Date Noted   Thyroid disease 07/25/2020   Acute pulmonary edema (HCC)    Longstanding persistent atrial fibrillation ([place])    Palliative care by specialist    Encounter for hospice care discussion    Protein-calorie malnutrition, severe 04/27/2019   Acute on chronic diastolic (congestive) heart failure ([place]) 04/27/2019   Normocytic anemia 04/26/2019   Goals of care, counseling/discussion    Palliative care encounter    Hypotension 04/17/2019   Debility 03/16/2019   Shortness of breath 03/16/2019   Cachexia ([place]) 03/16/2019   Permanent  atrial fibrillation ([place])    Hypoxia 03/15/2019   CHF exacerbation ([place]) 03/14/2019   Bilateral pleural effusion 03/04/2019   Chronic atrial fibrillation ([place]) 03/04/2019   DNR (do not resuscitate) 03/04/2019   Acute on chronic diastolic congestive heart failure ([place]) 03/02/2019   Chronic diastolic heart failure ([place]) 03/02/2019   Chronic renal failure, stage 3b ([place]) 03/02/2019   Acute respiratory failure with hypoxia ([place]) 03/02/2019   Acute exacerbation of CHF (congestive heart failure) ([place]) 01/27/2019   Atrial fibrillation with RVR ([place]) 01/27/2019   Iron deficiency anemia due to chronic blood loss---H/o Gi Bleed 01/27/2019   Vaginal mass 07/29/2017   Heme positive stool 07/25/2017   Cellulitis 12/10/2016   Facial cellulitis    Erroneous encounter - disregard 03/17/2015   AKI (acute kidney injury) ([place])    Metabolic acidosis 92/33/0076   Acute renal failure syndrome ([place])    Renal failure 08/22/2014   Hyperkalemia 08/22/2014   UTI (urinary tract infection) 08/22/2014   Acute renal failure ([place]) 08/22/2014   Dyspnea 03/15/2014   Type 2 diabetes mellitus ([place]) 22/63/3354   Acute diastolic congestive heart failure ([place]) 06/07/2013   Syncope 03/26/2013   Rectal bleeding 03/26/2013   Dehydration 03/26/2013   Generalized weakness 03/26/2013   Malnutrition of moderate degree ([place]) 03/01/2013   Diarrhea 02/28/2013   Loss of weight 02/28/2013   H/o Prior duodenal ulcer with hemorrhage 02/27/2013  H/o Prior Lower GI bleed (anorectal Ca) 02/26/2013   Acute blood loss anemia 02/26/2013   UTI (lower urinary tract infection) 02/26/2013   Hypertension 02/26/2013    Past Surgical History:  Procedure Laterality Date   BIOPSY  07/28/2017   Procedure: BIOPSY;  Surgeon: Rogene Houston, MD;  Location[facility];  Service: Endoscopy;;  rectum   COLONOSCOPY N/A 07/28/2017   Procedure: COLONOSCOPY;  Surgeon: Rogene Houston, MD;  Location: [facility];  Service: Endoscopy;   Laterality: N/A;   ESOPHAGOGASTRODUODENOSCOPY N/A 02/27/2013   Procedure: ESOPHAGOGASTRODUODENOSCOPY (EGD);  Surgeon: Ladene Artist, MD;  Location: [facility] ENDOSCOPY;  Service: Endoscopy;  Laterality: N/A;   ESOPHAGOGASTRODUODENOSCOPY N/A 03/28/2013   Procedure: ESOPHAGOGASTRODUODENOSCOPY (EGD);  Surgeon: Rogene Houston, MD;  Location: [facility];  Service: Endoscopy;  Laterality: N/A;   HOT HEMOSTASIS N/A 02/27/2013   Procedure: HOT HEMOSTASIS (ARGON PLASMA COAGULATION/BICAP);  Surgeon: Ladene Artist, MD;  Location: [facility] ENDOSCOPY;  Service: Endoscopy;  Laterality: N/A;   NOSE SURGERY     for skin cancer   POLYPECTOMY  07/28/2017   Procedure: POLYPECTOMY;  Surgeon: Rogene Houston, MD;  Location: [facility];  Service: Endoscopy;;  colon     Allergies Clonazepam, Codeine, and Sulfa antibiotics  Family History  Problem Relation Age of Onset   Heart attack Father     Social History Social History   Tobacco Use   Smoking status: Never   Smokeless tobacco: Never  Vaping Use   Vaping Use: Never used  Substance Use Topics   Alcohol use: No    Alcohol/week: 0.0 standard drinks   Drug use: No    Review of Systems  Constitutional: No fever/chills Eyes: No visual changes. ENT: No sore throat. Cardiovascular: Denies chest pain. Respiratory: Intermittent shortness of breath. Gastrointestinal: No abdominal pain.  Positive nausea, no vomiting.  No diarrhea.  No constipation. Genitourinary: Negative for dysuria. Musculoskeletal: Negative for back pain. Skin: Negative for rash. Neurological: Negative for headaches, focal weakness or numbness.  10-point ROS otherwise negative.  ____________________________________________   PHYSICAL EXAM:  VITAL SIGNS: ED Triage Vitals  Enc Vitals Group     BP 09/13/20 1535 122/74     Pulse Rate 09/13/20 1535 73     Resp 09/13/20 1535 16     Temp 09/13/20 1529 97.7 F (36.5 C)     Temp Source 09/13/20 1529 Oral     SpO2 09/13/20  1535 100 %     Weight 09/13/20 1526 114 lb (51.7 kg)     Height 09/13/20 1526 5\' 6"  (1.676 m)   Constitutional: Alert and oriented. Well appearing and in no acute distress. Eyes: Conjunctivae are normal.  Head: Atraumatic. Nose: No congestion/rhinnorhea. Mouth/Throat: Mucous membranes are moist.   Neck: No stridor.  Cardiovascular: Normal rate, regular rhythm. Good peripheral circulation. Grossly normal heart sounds.   Respiratory: Normal respiratory effort.  No retractions. Lungs CTAB. Gastrointestinal: Soft and nontender. No distention.  Musculoskeletal: No lower extremity tenderness nor edema. No gross deformities of extremities. Neurologic:  Normal speech and language. No gross focal neurologic deficits are appreciated.  Skin:  Skin is warm, dry and intact. No rash noted.   ____________________________________________   LABS (all labs ordered are listed, but only abnormal results are displayed)  Labs Reviewed  URINE CULTURE - Abnormal; Notable for the following components:      Result Value   Culture >=100,000 COLONIES/mL ESCHERICHIA COLI (*)    Organism ID, Bacteria ESCHERICHIA COLI (*)  All other components within normal limits  COMPREHENSIVE METABOLIC PANEL - Abnormal; Notable for the following components:   Glucose, Bld 102 (*)    Creatinine, Ser 1.08 (*)    Total Bilirubin 0.2 (*)    GFR, Estimated 49 (*)    All other components within normal limits  BRAIN NATRIURETIC PEPTIDE - Abnormal; Notable for the following components:   B Natriuretic Peptide 246.0 (*)    All other components within normal limits  CBC WITH DIFFERENTIAL/PLATELET - Abnormal; Notable for the following components:   Hemoglobin 11.8 (*)    All other components within normal limits  URINALYSIS, ROUTINE W REFLEX MICROSCOPIC - Abnormal; Notable for the following components:   Color, Urine STRAW (*)    Specific Gravity, Urine 1.003 (*)    Leukocytes,Ua MODERATE (*)    Bacteria, UA RARE (*)    All  other components within normal limits  RESP PANEL BY RT-PCR (FLU A&B, COVID) ARPGX2  TROPONIN I (HIGH SENSITIVITY)  TROPONIN I (HIGH SENSITIVITY)   ____________________________________________  EKG   EKG Interpretation  Date/Time:  Wednesday September 13 2020 15:42:56 EDT Ventricular Rate:  71 PR Interval:    QRS Duration: 132 QT Interval:  427 QTC Calculation: 464 R Axis:   -41 Text Interpretation: Atrial fibrillation RBBB and LAFB  Similar to April 2022 tracing Confirmed by Nanda Quinton [phone]) on 09/13/2020 3:58:15 PM         ____________________________________________  RADIOLOGY  CXR reviewed.   ____________________________________________   PROCEDURES  Procedure(s) performed:   Procedures  None  ____________________________________________   INITIAL IMPRESSION / ASSESSMENT AND PLAN / ED COURSE  Pertinent labs & imaging results that were available during my care of the patient were reviewed by me and considered in my medical decision making (see chart for details).   Presents to the emergency department with episodic shortness of breath with nausea.  No chest pain.  Symptoms have since improved.  She is on 2 L nasal cannula oxygen for comfort but not hypoxemic.  No wheezing, rales, rhonchi on exam.  Patient does not appear clinically volume overloaded.  Lower suspicion for fever.  Abdomen is diffusely soft and nontender.  Plan for screening blood work, COVID swab, chest x-ray, reassess.  Lab work reviewed.  Patient has rare bacteria with moderate leukocytes on UA but no urinary tract infection symptoms.  Plan to send this for culture but will hold on treatment without symptoms currently.  Her COVID and flu are negative.  Mild BNP elevation.  Troponin is normal.  Chest x-ray reviewed with no acute findings.  Patient will continue to follow with her PCP.  Discussed ED return precautions in detail. ____________________________________________  FINAL CLINICAL  IMPRESSION(S) / ED DIAGNOSES  Final diagnoses:  SOB (shortness of breath)  Nausea    Note:  This document was prepared using Dragon voice recognition software and may include unintentional dictation errors.  Nanda Quinton, MD, [facility]    Tria Noguera, [place], MD 09/16/20 1039

## 2020-09-16 LAB — URINE CULTURE: Culture: >=100000 — AB

## 2020-09-17 ENCOUNTER — Telehealth: Payer: Self-pay | Admitting: Emergency Medicine

## 2020-09-17 NOTE — Telephone Encounter (Signed)
Post ED Visit - Positive Culture Follow-up  Culture report reviewed by antimicrobial stewardship pharmacist: [place] Team []  Elenor Quinones, Pharm.D. []  Heide Guile, Pharm.D., BCPS AQ-ID []  Parks Neptune, Pharm.D., BCPS []  Alycia Rossetti, Pharm.D., BCPS []  [place], Pharm.D., BCPS, AAHIVP []  Legrand Como, Pharm.D., BCPS, AAHIVP []  Salome Arnt, PharmD, BCPS []  Johnnette Gourd, PharmD, BCPS []  Hughes Better, PharmD, BCPS [x]  Bertis Ruddy, PharmD []  Laqueta Linden, PharmD, BCPS []  Albertina Parr, PharmD  [place] Team []  Leodis Sias, PharmD []  Lindell Spar, PharmD []  Royetta Asal, PharmD []  Graylin Shiver, Rph []  Rema Fendt) Glennon Mac, PharmD []  Arlyn Dunning, PharmD []  Netta Cedars, PharmD []  Dia Sitter, PharmD []  Leone Haven, PharmD []  Gretta Arab, PharmD []  Theodis Shove, PharmD []  Peggyann Juba, PharmD []  Reuel Boom, PharmD   Positive urine culture No further patient follow-up is required at this time.  Sandi Raveling Armida Vickroy 09/17/2020, 3:40 PM

## 2020-09-20 DIAGNOSIS — Z7689 Persons encountering health services in other specified circumstances: Secondary | ICD-10-CM | POA: Diagnosis not present

## 2020-09-20 DIAGNOSIS — I10 Essential (primary) hypertension: Secondary | ICD-10-CM | POA: Diagnosis not present

## 2020-09-20 DIAGNOSIS — E039 Hypothyroidism, unspecified: Secondary | ICD-10-CM | POA: Diagnosis not present

## 2020-09-20 DIAGNOSIS — Z8679 Personal history of other diseases of the circulatory system: Secondary | ICD-10-CM | POA: Diagnosis not present

## 2020-09-25 ENCOUNTER — Ambulatory Visit: Payer: Medicare Other | Admitting: Urology

## 2020-09-25 DIAGNOSIS — E039 Hypothyroidism, unspecified: Secondary | ICD-10-CM | POA: Diagnosis not present

## 2020-09-25 DIAGNOSIS — E785 Hyperlipidemia, unspecified: Secondary | ICD-10-CM | POA: Diagnosis not present

## 2020-09-25 DIAGNOSIS — I10 Essential (primary) hypertension: Secondary | ICD-10-CM | POA: Diagnosis not present

## 2020-09-25 DIAGNOSIS — I509 Heart failure, unspecified: Secondary | ICD-10-CM | POA: Diagnosis not present

## 2020-09-25 DIAGNOSIS — Z8679 Personal history of other diseases of the circulatory system: Secondary | ICD-10-CM | POA: Diagnosis not present

## 2020-09-25 DIAGNOSIS — K219 Gastro-esophageal reflux disease without esophagitis: Secondary | ICD-10-CM | POA: Diagnosis not present

## 2020-09-25 DIAGNOSIS — E611 Iron deficiency: Secondary | ICD-10-CM | POA: Diagnosis not present

## 2020-09-25 DIAGNOSIS — N1832 Chronic kidney disease, stage 3b: Secondary | ICD-10-CM | POA: Diagnosis not present

## 2020-09-28 DIAGNOSIS — J9621 Acute and chronic respiratory failure with hypoxia: Secondary | ICD-10-CM | POA: Diagnosis not present

## 2020-10-29 DIAGNOSIS — J9621 Acute and chronic respiratory failure with hypoxia: Secondary | ICD-10-CM | POA: Diagnosis not present

## 2020-11-01 ENCOUNTER — Emergency Department (HOSPITAL_COMMUNITY): Payer: Medicare Other

## 2020-11-01 ENCOUNTER — Other Ambulatory Visit: Payer: Self-pay

## 2020-11-01 ENCOUNTER — Emergency Department (HOSPITAL_COMMUNITY)
Admission: EM | Admit: 2020-11-01 | Discharge: 2020-11-02 | Disposition: A | Discharge status: Home / Self Care | Payer: Medicare Other | Attending: Emergency Medicine | Admitting: Emergency Medicine

## 2020-11-01 ENCOUNTER — Encounter (HOSPITAL_COMMUNITY): Payer: Self-pay | Admitting: *Deleted

## 2020-11-01 DIAGNOSIS — Z85828 Personal history of other malignant neoplasm of skin: Secondary | ICD-10-CM | POA: Insufficient documentation

## 2020-11-01 DIAGNOSIS — Z79899 Other long term (current) drug therapy: Secondary | ICD-10-CM | POA: Diagnosis not present

## 2020-11-01 DIAGNOSIS — N1832 Chronic kidney disease, stage 3b: Secondary | ICD-10-CM | POA: Diagnosis not present

## 2020-11-01 DIAGNOSIS — R0602 Shortness of breath: Secondary | ICD-10-CM | POA: Insufficient documentation

## 2020-11-01 DIAGNOSIS — J9811 Atelectasis: Secondary | ICD-10-CM | POA: Diagnosis not present

## 2020-11-01 DIAGNOSIS — I5033 Acute on chronic diastolic (congestive) heart failure: Secondary | ICD-10-CM | POA: Diagnosis not present

## 2020-11-01 DIAGNOSIS — Z743 Need for continuous supervision: Secondary | ICD-10-CM | POA: Diagnosis not present

## 2020-11-01 DIAGNOSIS — R911 Solitary pulmonary nodule: Secondary | ICD-10-CM | POA: Diagnosis not present

## 2020-11-01 DIAGNOSIS — E119 Type 2 diabetes mellitus without complications: Secondary | ICD-10-CM | POA: Insufficient documentation

## 2020-11-01 DIAGNOSIS — I499 Cardiac arrhythmia, unspecified: Secondary | ICD-10-CM | POA: Diagnosis not present

## 2020-11-01 DIAGNOSIS — I517 Cardiomegaly: Secondary | ICD-10-CM | POA: Diagnosis not present

## 2020-11-01 DIAGNOSIS — Z20822 Contact with and (suspected) exposure to covid-19: Secondary | ICD-10-CM | POA: Diagnosis not present

## 2020-11-01 DIAGNOSIS — J90 Pleural effusion, not elsewhere classified: Secondary | ICD-10-CM | POA: Diagnosis not present

## 2020-11-01 DIAGNOSIS — R000 Tachycardia, unspecified: Secondary | ICD-10-CM | POA: Diagnosis not present

## 2020-11-01 DIAGNOSIS — I130 Hypertensive heart and chronic kidney disease with heart failure and stage 1 through stage 4 chronic kidney disease, or unspecified chronic kidney disease: Secondary | ICD-10-CM | POA: Diagnosis not present

## 2020-11-01 DIAGNOSIS — R0600 Dyspnea, unspecified: Secondary | ICD-10-CM | POA: Diagnosis not present

## 2020-11-01 DIAGNOSIS — R0689 Other abnormalities of breathing: Secondary | ICD-10-CM | POA: Diagnosis not present

## 2020-11-01 DIAGNOSIS — I4510 Unspecified right bundle-branch block: Secondary | ICD-10-CM | POA: Diagnosis not present

## 2020-11-01 DIAGNOSIS — R079 Chest pain, unspecified: Secondary | ICD-10-CM | POA: Diagnosis not present

## 2020-11-01 LAB — CBC
HCT: 36.3 % (ref 36.0–46.0)
Hemoglobin: 11.2 g/dL — ABNORMAL LOW (ref 12.0–15.0)
MCH: 29.3 pg (ref 26.0–34.0)
MCHC: 30.9 g/dL (ref 30.0–36.0)
MCV: 95 fL (ref 80.0–100.0)
Platelets: 206 10*3/uL (ref 150–400)
RBC: 3.82 MIL/uL — ABNORMAL LOW (ref 3.87–5.11)
RDW: 14.3 % (ref 11.5–15.5)
WBC: 5.7 10*3/uL (ref 4.0–10.5)
nRBC: 0 % (ref 0.0–0.2)

## 2020-11-01 LAB — BASIC METABOLIC PANEL
Anion gap: 12 (ref 5–15)
BUN: 16 mg/dL (ref 8–23)
CO2: 25 mmol/L (ref 22–32)
Calcium: 8.5 mg/dL — ABNORMAL LOW (ref 8.9–10.3)
Chloride: 101 mmol/L (ref 98–111)
Creatinine, Ser: 1.22 mg/dL — ABNORMAL HIGH (ref 0.44–1.00)
GFR, Estimated: 42 mL/min — ABNORMAL LOW (ref >60)
Glucose, Bld: 92 mg/dL (ref 70–99)
Potassium: 3.5 mmol/L (ref 3.5–5.1)
Sodium: 138 mmol/L (ref 135–145)

## 2020-11-01 LAB — HEPATIC FUNCTION PANEL
ALT: 10 U/L (ref 0–44)
AST: 13 U/L — ABNORMAL LOW (ref 15–41)
Albumin: 3.4 g/dL — ABNORMAL LOW (ref 3.5–5.0)
Alkaline Phosphatase: 86 U/L (ref 38–126)
Bilirubin, Direct: 0.1 mg/dL (ref 0.0–0.2)
Indirect Bilirubin: 0.6 mg/dL (ref 0.3–0.9)
Total Bilirubin: 0.7 mg/dL (ref 0.3–1.2)
Total Protein: 7.3 g/dL (ref 6.5–8.1)

## 2020-11-01 LAB — TROPONIN I (HIGH SENSITIVITY)
Troponin I (High Sensitivity): 6 ng/L (ref <18)
Troponin I (High Sensitivity): 7 ng/L (ref <18)

## 2020-11-01 LAB — RESP PANEL BY RT-PCR (FLU A&B, COVID) ARPGX2
Influenza A by PCR: NEGATIVE
Influenza B by PCR: NEGATIVE
SARS Coronavirus 2 by RT PCR: NEGATIVE

## 2020-11-01 LAB — D-DIMER, QUANTITATIVE: D-Dimer, Quant: 1.04 ug/mL-FEU — ABNORMAL HIGH (ref 0.00–0.50)

## 2020-11-01 IMAGING — CT CT ANGIO CHEST
2 of 6 series · 18 of 36 positions shown · IV contrast (Omnipaque or Isovue)
Comparison: Chest CT [DATE]

CLINICAL DATA: Shortness of breath and chest pain

EXAM:
CT ANGIOGRAPHY CHEST WITH CONTRAST
TECHNIQUE: Multidetector CT imaging of the chest was performed using the
standard protocol during bolus administration of intravenous
contrast. Multiplanar CT image reconstructions and MIPs were
obtained to evaluate the vascular anatomy.
CONTRAST:  80mL OMNIPAQUE IOHEXOL 350 MG/ML SOLN

[Series 5: pe axial thins · axial · 0.70mm/px · z∈[-132,+135]mm · 17 of 370 slices shown]
[im 18/370  lung]
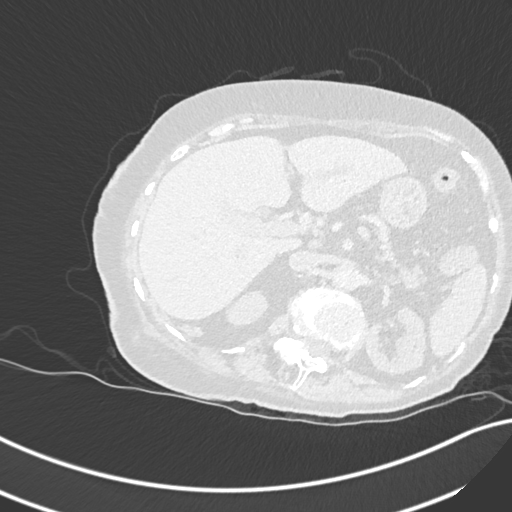
[im 36/370  mediastinal]
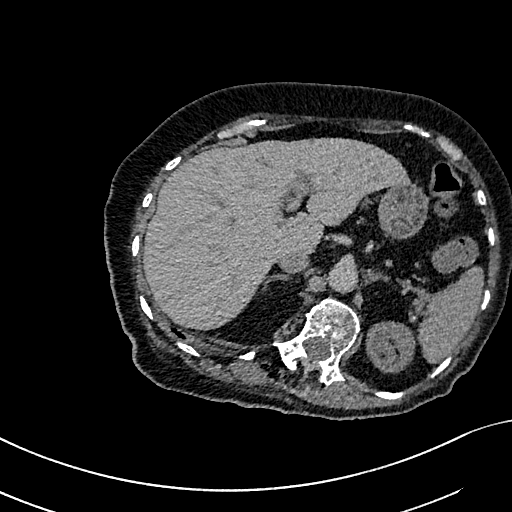
[im 53/370  lung]
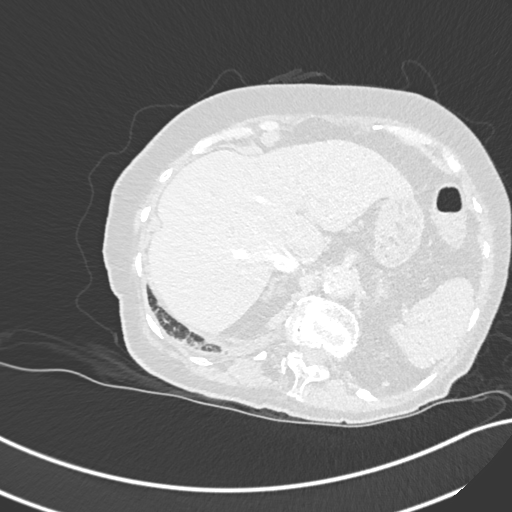
[im 88/370  mediastinal]
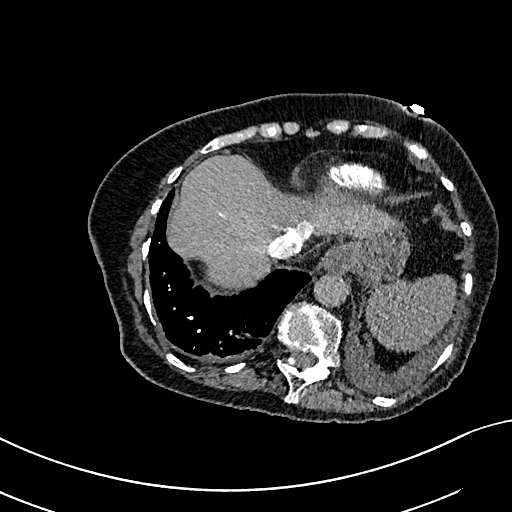
[im 106/370  lung]
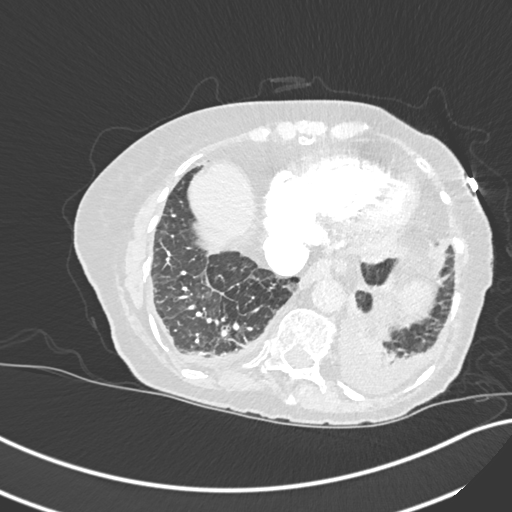
[im 124/370  mediastinal]
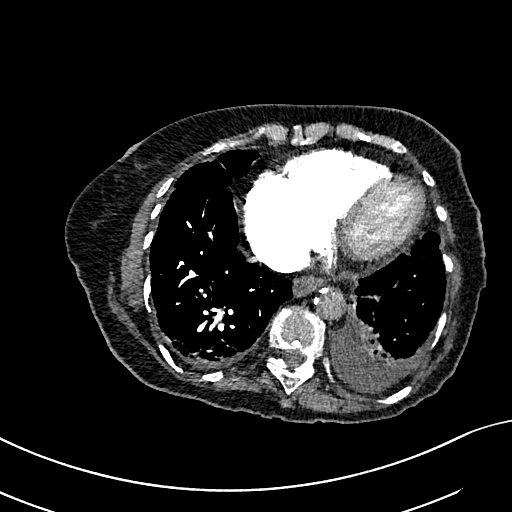
[im 141/370  lung]
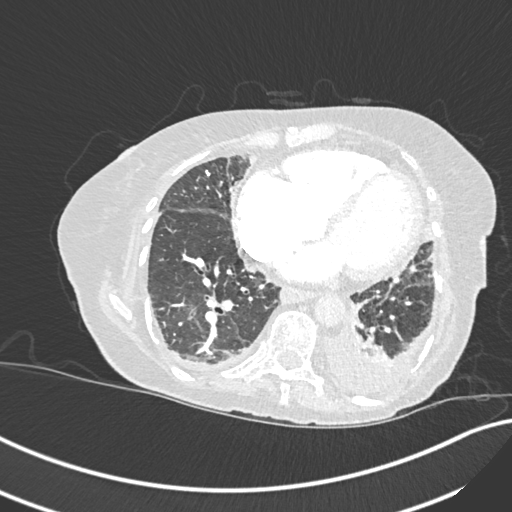
[im 159/370  mediastinal]
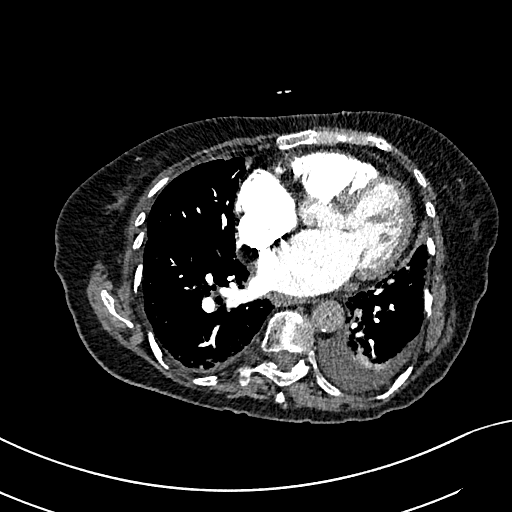
[im 194/370  lung]
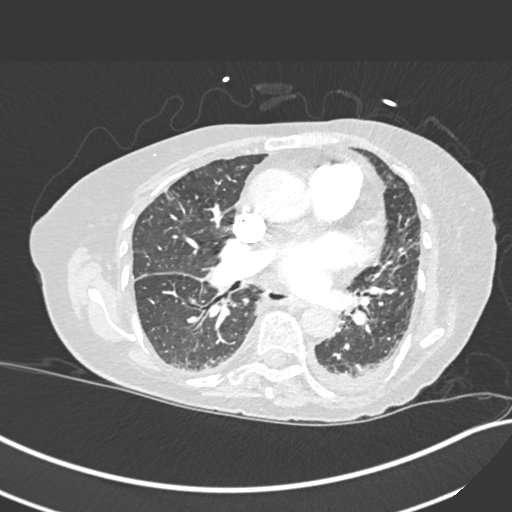
[im 211/370  mediastinal]
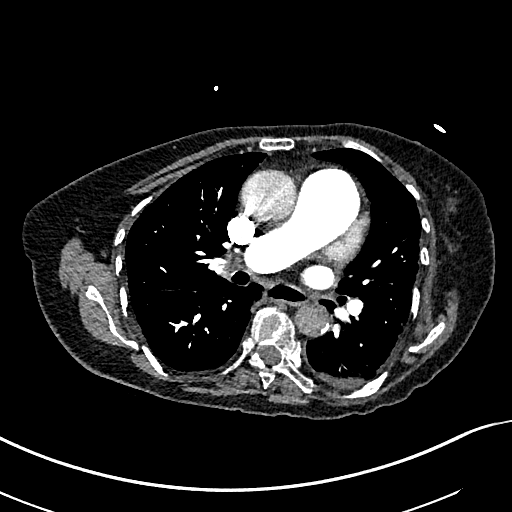
[im 229/370  lung]
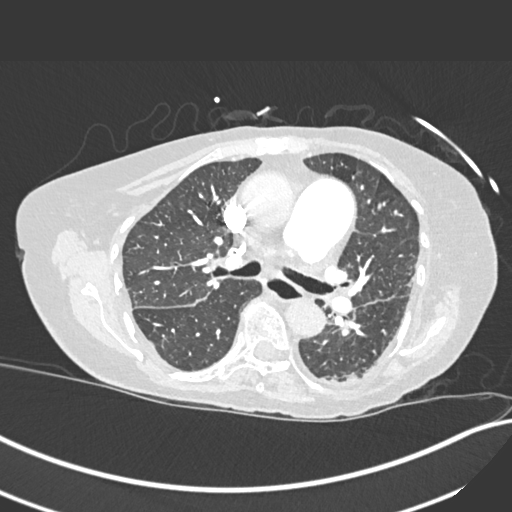
[im 247/370  mediastinal]
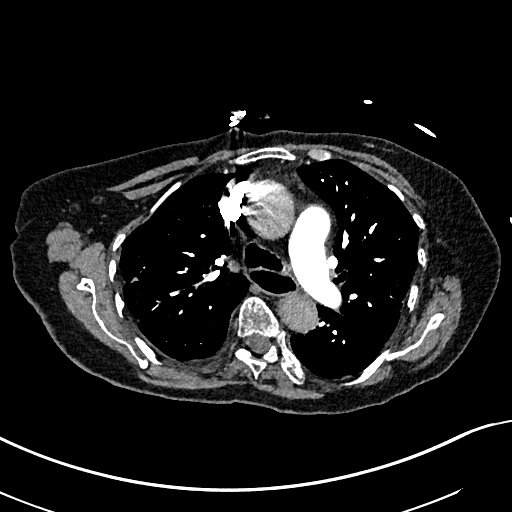
[im 264/370  lung]
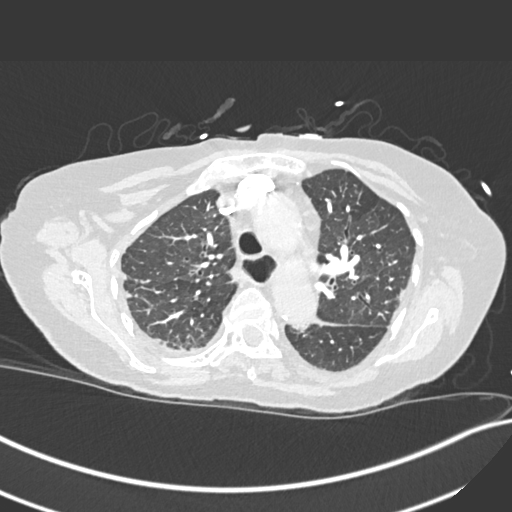
[im 282/370  mediastinal]
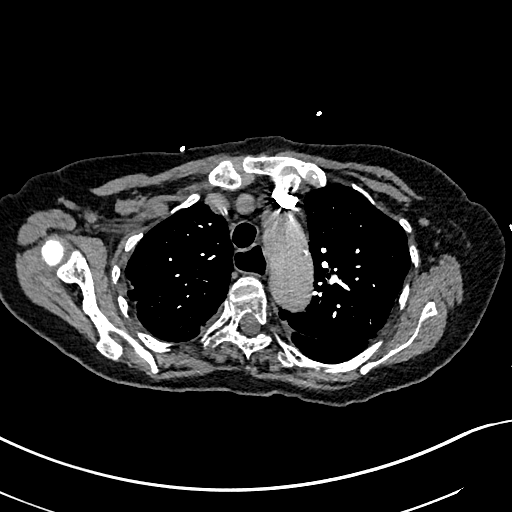
[im 317/370  lung]
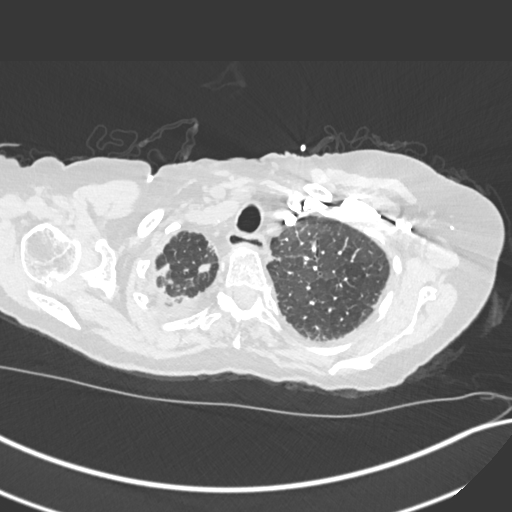
[im 334/370  mediastinal]
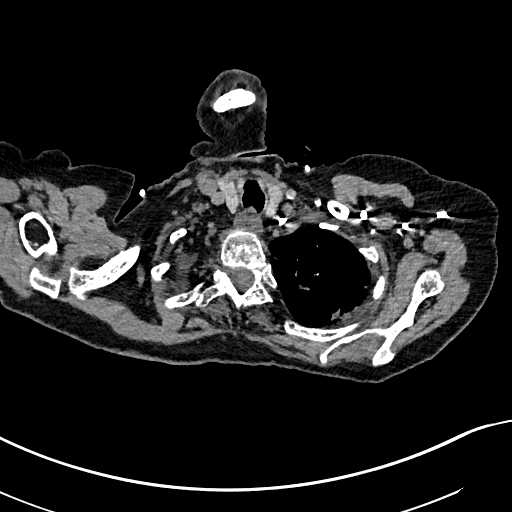
[im 352/370  lung]
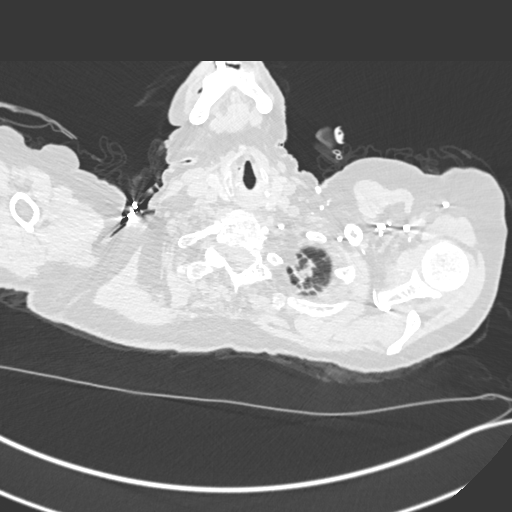

[Series 7: cor soft · coronal · 0.76mm/px · 1 of 109 slices shown]
[im 55/109  mediastinal]
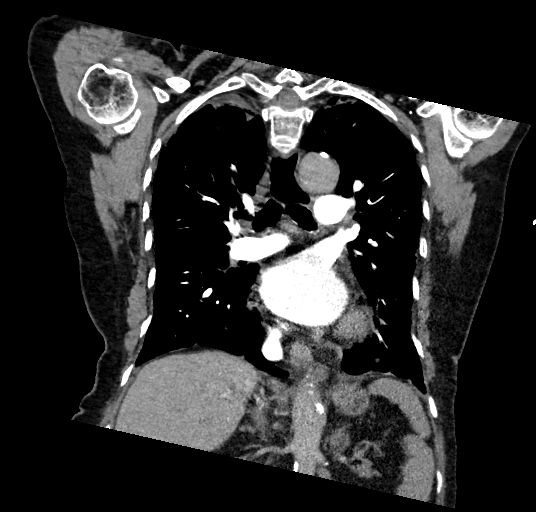

[18 of 36 positions shown; findings below may reference images not displayed]

FINDINGS: Cardiovascular: Contrast injection is sufficient to demonstrate
satisfactory opacification of the pulmonary arteries to the
segmental level. There is no pulmonary embolus or evidence of right
heart strain. The size of the main pulmonary artery is enlarged,
measuring 3.6 cm. Mild cardiomegaly. The course and caliber of the
aorta are normal. There is atherosclerotic calcification.
Opacification decreased due to pulmonary arterial phase contrast
bolus timing.

Mediastinum/Nodes: Patulous esophagus. No mediastinal
lymphadenopathy.

Lungs/Pleura: Small left pleural effusion. Bibasilar atelectasis.
Nodular focus in the right middle lobe is unchanged and likely an
area of atelectasis (series 6 image 46) measuring 8 mm.

Upper Abdomen: Contrast bolus timing is not optimized for evaluation
of the abdominal organs. The visualized portions of the organs of
the upper abdomen are normal.

Musculoskeletal: No chest wall abnormality. No bony spinal canal
stenosis.

Review of the MIP images confirms the above findings.
IMPRESSION: 1. No pulmonary embolus or acute aortic syndrome.
2. Small left pleural effusion and bibasilar atelectasis.
3. Enlarged main pulmonary artery, consistent with pulmonary
hypertension.
4. Unchanged 8 mm nodular opacity in the right middle lobe is
favored be an area of atelectasis. Non-contrast chest CT at 6-12
months is recommended. If the nodule is stable at time of repeat CT,
then future CT at 18-24 months (from today's scan) is considered
optional for low-risk patients, but is recommended for high-risk
patients. This recommendation follows the consensus statement:
Guidelines for Management of Incidental Pulmonary Nodules Detected

Aortic atherosclerosis ([ZE]-[ZE]).

## 2020-11-01 IMAGING — DX DG CHEST 2V
2 series · 2 of 2 positions shown · non-contrast
Comparison: [DATE], [DATE], [DATE]

CLINICAL DATA: Shortness of breath chest pain

EXAM:
CHEST - 2 VIEW

[chest lat]
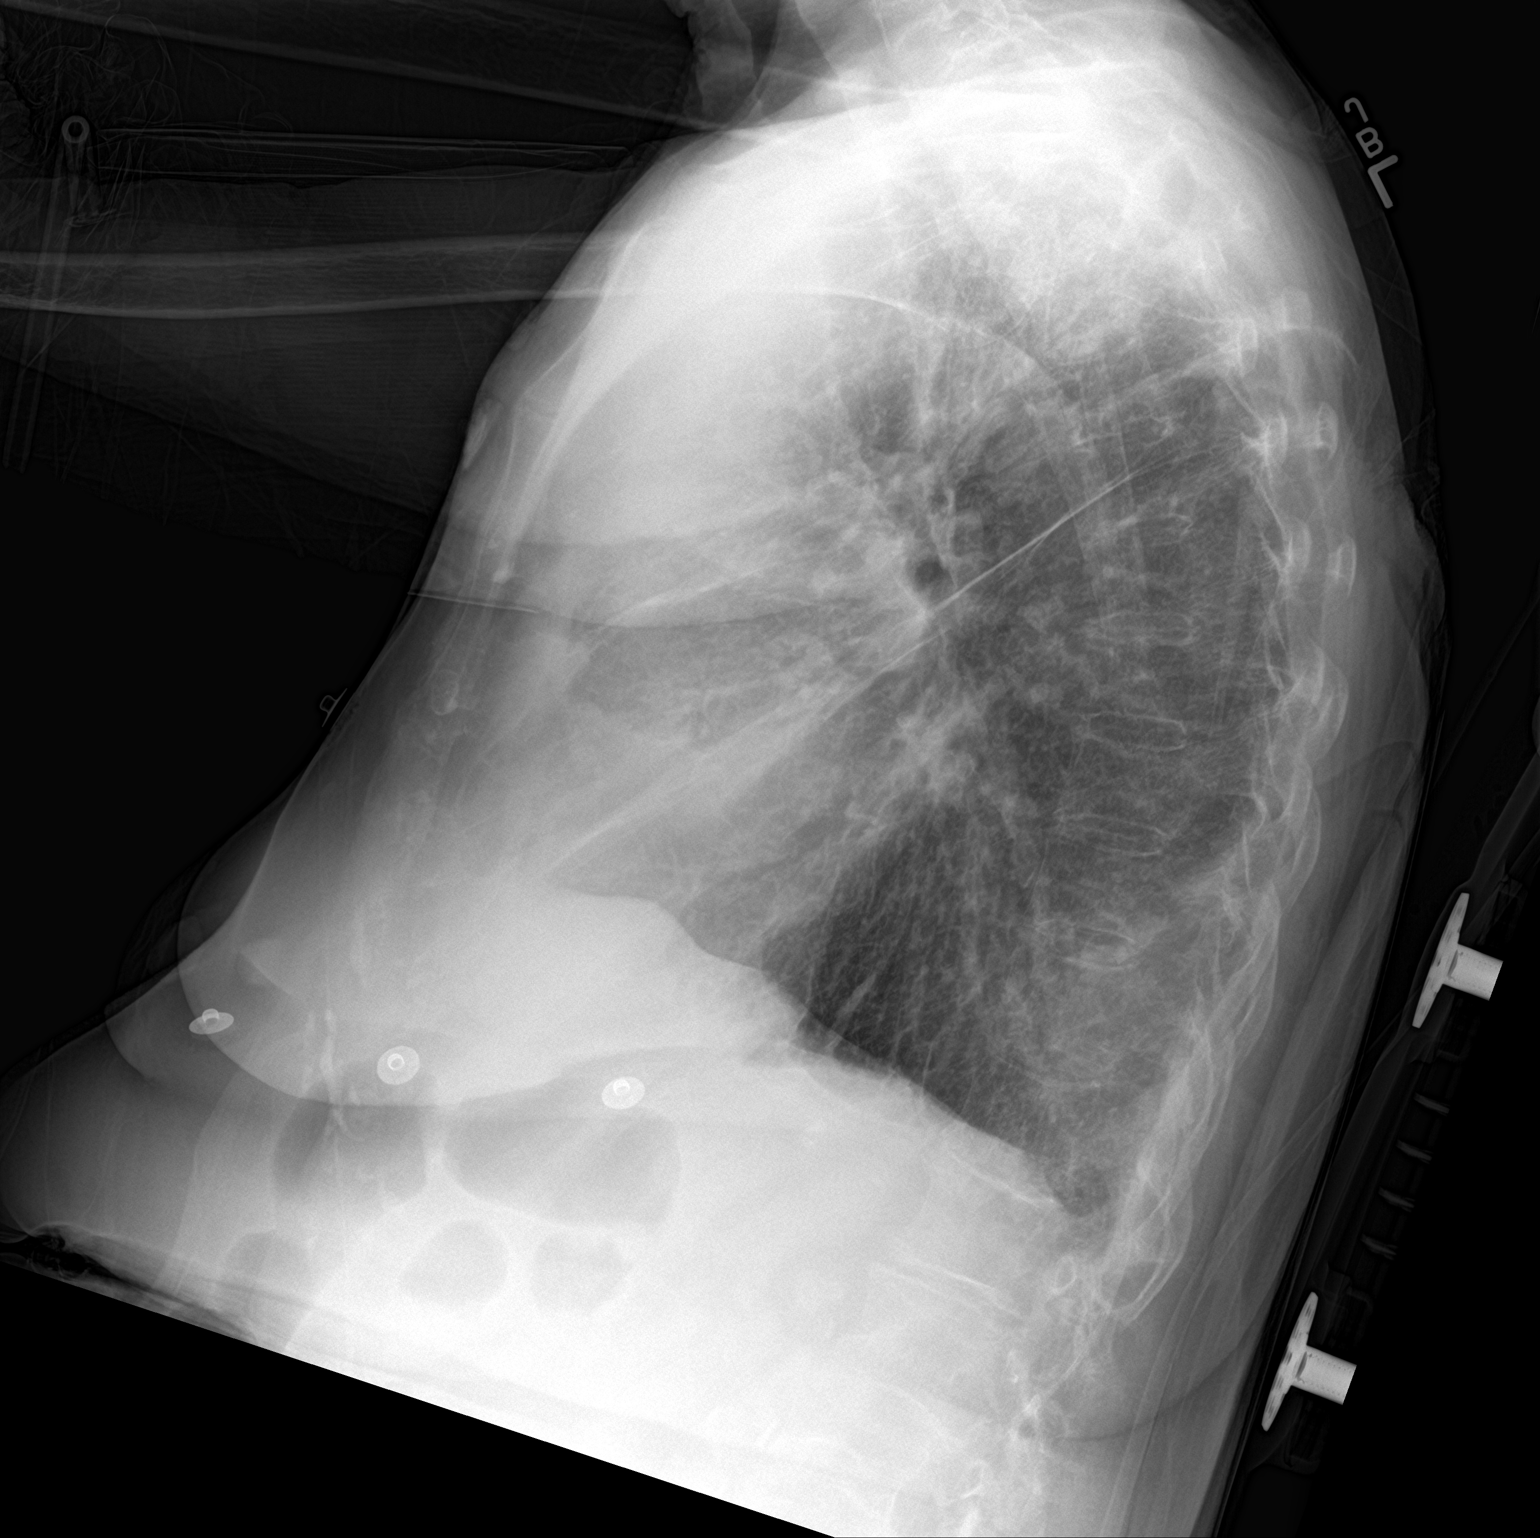

[chest ap]
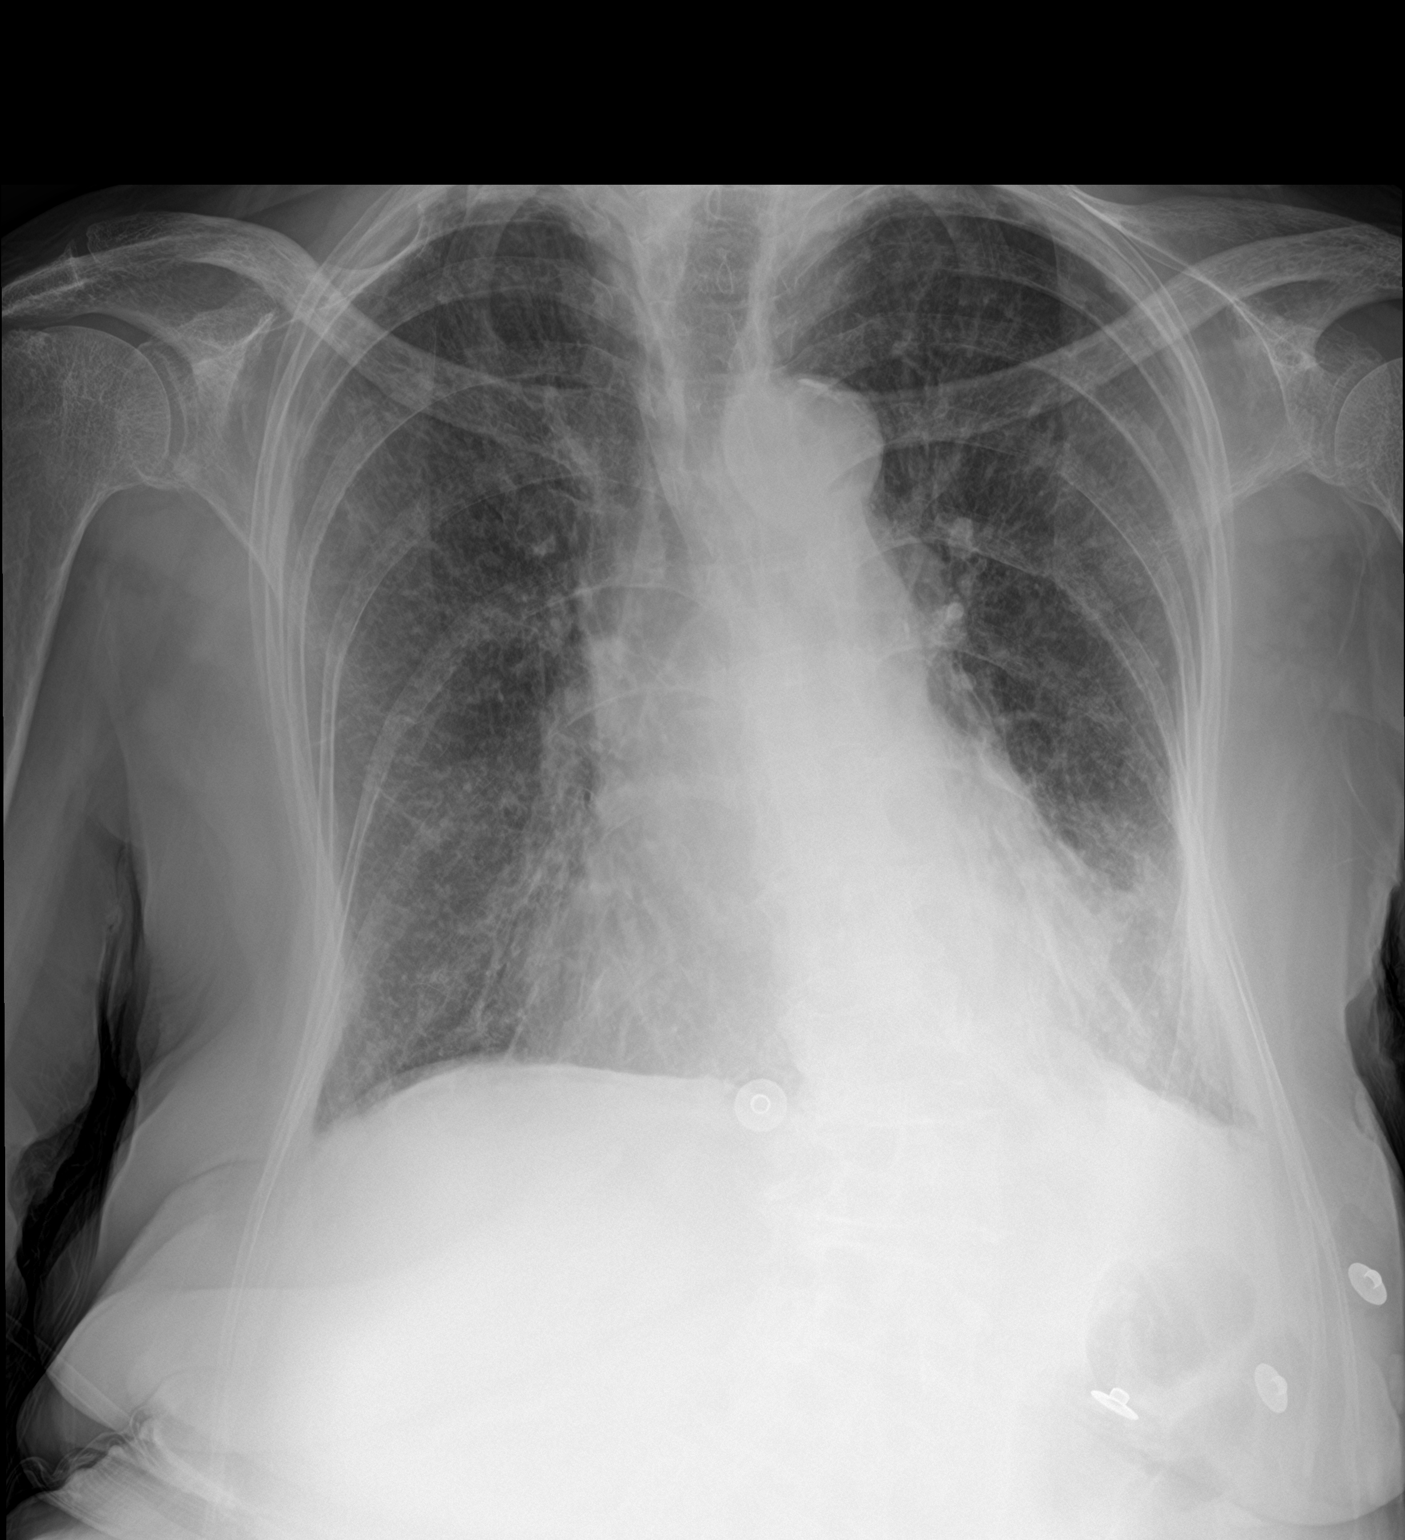

[2 of 2 positions shown; findings below may reference images not displayed]

FINDINGS: Mild diffuse interstitial opacities are chronic. Enlarged
cardiomediastinal silhouette with aortic atherosclerosis. No
pneumothorax.
IMPRESSION: Mild diffuse chronic interstitial opacity.  Stable mild cardiomegaly

## 2020-11-01 MED ORDER — IOHEXOL 350 MG/ML SOLN
80.0000 mL | Freq: Once | INTRAVENOUS | Status: AC | PRN
  Administered 2020-11-01: 80 mL via INTRAVENOUS

## 2020-11-01 NOTE — ED Notes (Signed)
Patient transported to CT 

## 2020-11-01 NOTE — ED Notes (Signed)
Pt denies any CP at present.

## 2020-11-01 NOTE — ED Provider Notes (Signed)
[facility] EMERGENCY DEPARTMENT Provider Note   CSN: [id number] Arrival date & time: 11/01/20  1604     History Chief Complaint  Patient presents with   Shortness of Breath    Renee Blake is a 87 y.o. female.  Patient complains of mild shortness of breath.  No fevers no chills.  No cough no vomiting  The history is provided by the patient and medical records. No language interpreter was used.  Shortness of Breath Severity:  Moderate Onset quality:  Sudden Duration:  5 days Timing:  Constant Progression:  Waxing and waning Chronicity:  Recurrent Context: activity   Relieved by:  Nothing Worsened by:  Nothing Associated symptoms: no abdominal pain, no chest pain, no cough, no headaches and no rash       Past Medical History:  Diagnosis Date   Acute blood loss anemia    Atrial fibrillation ([place])    a. diagnosed in 12/2018. Rate-control pursued given not a candidate for anticoagulation   Chronic diarrhea    Dilation of biliary tract    Duodenal stricture    Esophageal stricture    Gastric ulcer    GI bleed    Hyperlipemia    Hypertension    Microscopic colitis    Nephrolithiasis    Pancreatic duct dilated    Schatzki's ring    Skin cancer    Sliding hiatal hernia    Vertigo     Patient Active Problem List   Diagnosis Date Noted   Thyroid disease 07/25/2020   Acute pulmonary edema (HCC)    Longstanding persistent atrial fibrillation ([place])    Palliative care by specialist    Encounter for hospice care discussion    Protein-calorie malnutrition, severe 04/27/2019   Acute on chronic diastolic (congestive) heart failure ([place]) 04/27/2019   Normocytic anemia 04/26/2019   Goals of care, counseling/discussion    Palliative care encounter    Hypotension 04/17/2019   Debility 03/16/2019   Shortness of breath 03/16/2019   Cachexia ([place]) 03/16/2019   Permanent atrial fibrillation ([place])    Hypoxia 03/15/2019   CHF exacerbation ([place]) 03/14/2019   Bilateral  pleural effusion 03/04/2019   Chronic atrial fibrillation ([place]) 03/04/2019   DNR (do not resuscitate) 03/04/2019   Acute on chronic diastolic congestive heart failure ([place]) 03/02/2019   Chronic diastolic heart failure ([place]) 03/02/2019   Chronic renal failure, stage 3b ([place]) 03/02/2019   Acute respiratory failure with hypoxia ([place]) 03/02/2019   Acute exacerbation of CHF (congestive heart failure) ([place]) 01/27/2019   Atrial fibrillation with RVR ([place]) 01/27/2019   Iron deficiency anemia due to chronic blood loss---H/o Gi Bleed 01/27/2019   Vaginal mass 07/29/2017   Heme positive stool 07/25/2017   Cellulitis 12/10/2016   Facial cellulitis    Erroneous encounter - disregard 03/17/2015   AKI (acute kidney injury) ([place])    Metabolic acidosis 0000000   Acute renal failure syndrome ([place])    Renal failure 08/22/2014   Hyperkalemia 08/22/2014   UTI (urinary tract infection) 08/22/2014   Acute renal failure ([place]) 08/22/2014   Dyspnea 03/15/2014   Type 2 diabetes mellitus ([place]) [id number]   Acute diastolic congestive heart failure ([place]) 06/07/2013   Syncope 03/26/2013   Rectal bleeding 03/26/2013   Dehydration 03/26/2013   Generalized weakness 03/26/2013   Malnutrition of moderate degree ([place]) 03/01/2013   Diarrhea 02/28/2013   Loss of weight 02/28/2013   H/o Prior duodenal ulcer with hemorrhage 02/27/2013   H/o Prior Lower GI bleed (anorectal Ca) 02/26/2013  Acute blood loss anemia 02/26/2013   UTI (lower urinary tract infection) 02/26/2013   Hypertension 02/26/2013    Past Surgical History:  Procedure Laterality Date   BIOPSY  07/28/2017   Procedure: BIOPSY;  Surgeon: Rogene Houston, MD;  [facility];  Service: Endoscopy;;  rectum   COLONOSCOPY N/A 07/28/2017   Procedure: COLONOSCOPY;  Surgeon: Rogene Houston, MD;  Location: [facility];  Service: Endoscopy;  Laterality: N/A;   ESOPHAGOGASTRODUODENOSCOPY N/A 02/27/2013   Procedure: ESOPHAGOGASTRODUODENOSCOPY  (EGD);  Surgeon: Ladene Artist, MD;  Location: [facility] ENDOSCOPY;  Service: Endoscopy;  Laterality: N/A;   ESOPHAGOGASTRODUODENOSCOPY N/A 03/28/2013   Procedure: ESOPHAGOGASTRODUODENOSCOPY (EGD);  Surgeon: Rogene Houston, MD;  [facility];  Service: Endoscopy;  Laterality: N/A;   HOT HEMOSTASIS N/A 02/27/2013   Procedure: HOT HEMOSTASIS (ARGON PLASMA COAGULATION/BICAP);  Surgeon: Ladene Artist, MD;  Location: [facility] ENDOSCOPY;  Service: Endoscopy;  Laterality: N/A;   NOSE SURGERY     for skin cancer   POLYPECTOMY  07/28/2017   Procedure: POLYPECTOMY;  Surgeon: Rogene Houston, MD;  [facility];  Service: Endoscopy;;  colon      OB History     Gravida  6   Para  6   Term  6   Preterm      AB      Living  6      SAB      IAB      Ectopic      Multiple      Live Births              Family History  Problem Relation Age of Onset   Heart attack Father     Social History   Tobacco Use   Smoking status: Never   Smokeless tobacco: Never  Vaping Use   Vaping Use: Never used  Substance Use Topics   Alcohol use: No    Alcohol/week: 0.0 standard drinks   Drug use: No    Home Medications Prior to Admission medications   Medication Sig Start Date End Date Taking? Authorizing Provider  acetaminophen (TYLENOL) 500 MG tablet Take 1,000 mg by mouth every 6 (six) hours as needed.   Yes [provider]  ALPRAZolam Duanne Moron) 0.5 MG tablet Take 0.5 mg by mouth 2 (two) times daily as needed. 10/11/20  Yes [provider]  ALPRAZolam (XANAX) 0.25 MG tablet Take 1 tablet (0.25 mg total) by mouth 2 (two) times daily as needed. for anxiety 04/20/19   Manuella Ghazi, Pratik D, DO  diltiazem (CARDIZEM SR) 90 MG 12 hr capsule Take 1 capsule (90 mg total) by mouth 2 (two) times daily. 05/17/20   Verta Ellen., NP  diltiazem (CARDIZEM) 90 MG tablet Take 90 mg by mouth 2 (two) times daily. 10/11/20   [provider]  levothyroxine (SYNTHROID) 50  MCG tablet Take 50 mcg by mouth daily. 07/11/20   [provider]  loperamide (IMODIUM A-D) 2 MG tablet Take 2 mg by mouth daily as needed for diarrhea or loose stools.    [provider]  metoprolol succinate (TOPROL-XL) 25 MG 24 hr tablet Take 1 tablet (25 mg total) by mouth daily. 05/17/20 09/13/20  Verta Ellen., NP  ondansetron (ZOFRAN) 4 MG tablet Take 1 tablet (4 mg total) by mouth every 8 (eight) hours as needed for nausea or vomiting. 03/27/20   Hayden Rasmussen, MD  ondansetron (ZOFRAN-ODT) 4 MG disintegrating tablet  Take by mouth. 09/26/20   [provider]  pantoprazole (PROTONIX) 40 MG tablet Take 40 mg by mouth daily. 10/11/20   [provider]  potassium chloride SA (KLOR-CON) 20 MEQ tablet Take 1 tablet (20 mEq total) by mouth daily. 05/17/20   Verta Ellen., NP  torsemide (DEMADEX) 20 MG tablet Take 2 tablets (40 mg total) by mouth every morning. 09/05/20   Verta Ellen., NP  traMADol Veatrice Bourbon) 50 MG tablet Take 50 mg by mouth every 6 (six) hours as needed. 06/29/20   [provider]    Allergies    Clonazepam, Codeine, and Sulfa antibiotics  Review of Systems   Review of Systems  Constitutional:  Negative for appetite change and fatigue.  HENT:  Negative for congestion, ear discharge and sinus pressure.   Eyes:  Negative for discharge.  Respiratory:  Positive for shortness of breath. Negative for cough.   Cardiovascular:  Negative for chest pain.  Gastrointestinal:  Negative for abdominal pain and diarrhea.  Genitourinary:  Negative for frequency and hematuria.  Musculoskeletal:  Negative for back pain.  Skin:  Negative for rash.  Neurological:  Negative for seizures and headaches.  Psychiatric/Behavioral:  Negative for hallucinations.    Physical Exam Updated Vital Signs BP (!) 141/82 (BP Location: Right Arm)   Pulse 91   Temp 97.8 F (36.6 C) (Oral)   Resp 17   Ht '5\' 6"'$  (1.676 m)   Wt 51.7 kg   SpO2 100%    BMI 18.40 kg/m   Physical Exam Vitals reviewed.  Constitutional:      Appearance: She is well-developed.  HENT:     Head: Normocephalic.  Eyes:     General: No scleral icterus.    Conjunctiva/sclera: Conjunctivae normal.  Neck:     Thyroid: No thyromegaly.  Cardiovascular:     Rate and Rhythm: Normal rate and regular rhythm.     Heart sounds: No murmur heard.   No friction rub. No gallop.  Pulmonary:     Breath sounds: No stridor. No wheezing or rales.  Chest:     Chest wall: No tenderness.  Abdominal:     General: Abdomen is flat. There is no distension.     Tenderness: There is no abdominal tenderness. There is no rebound.  Musculoskeletal:        General: Normal range of motion.     Cervical back: Neck supple.  Lymphadenopathy:     Cervical: No cervical adenopathy.  Skin:    Findings: No erythema or rash.  Neurological:     Mental Status: She is oriented to person, place, and time.     Motor: No abnormal muscle tone.     Coordination: Coordination normal.  Psychiatric:        Behavior: Behavior normal.    ED Results / Procedures / Treatments   Labs (all labs ordered are listed, but only abnormal results are displayed) Labs Reviewed  BASIC METABOLIC PANEL - Abnormal; Notable for the following components:      Result Value   Creatinine, Ser 1.22 (*)    Calcium 8.5 (*)    GFR, Estimated 42 (*)    All other components within normal limits  CBC - Abnormal; Notable for the following components:   RBC 3.82 (*)    Hemoglobin 11.2 (*)    All other components within normal limits  HEPATIC FUNCTION PANEL - Abnormal; Notable for the following components:   Albumin 3.4 (*)  AST 13 (*)    All other components within normal limits  D-DIMER, QUANTITATIVE - Abnormal; Notable for the following components:   D-Dimer, Quant 1.04 (*)    All other components within normal limits  RESP PANEL BY RT-PCR (FLU A&B, COVID) ARPGX2  TROPONIN I (HIGH SENSITIVITY)  TROPONIN I  (HIGH SENSITIVITY)    EKG None  Radiology DG Chest 2 View  Result Date: 11/01/2020 CLINICAL DATA:  Shortness of breath chest pain EXAM: CHEST - 2 VIEW COMPARISON:  09/13/2020, 08/11/2020, 03/27/2020 FINDINGS: Mild diffuse interstitial opacities are chronic. Enlarged cardiomediastinal silhouette with aortic atherosclerosis. No pneumothorax. IMPRESSION: Mild diffuse chronic interstitial opacity.  Stable mild cardiomegaly Electronically Signed   By: Donavan Foil M.D.   On: 11/01/2020 19:02    Procedures Procedures   Medications Ordered in ED Medications  iohexol (OMNIPAQUE) 350 MG/ML injection 80 mL (80 mLs Intravenous Contrast Given 11/01/20 2231)    ED Course  I have reviewed the triage vital signs and the nursing notes.  Pertinent labs & imaging results that were available during my care of the patient were reviewed by me and considered in my medical decision making (see chart for details).    MDM Rules/Calculators/A&P                          Patient with improving dyspnea Labs unremarkable.  CT chest pending.  Disposition will be done by my colleague Final Clinical Impression(s) / ED Diagnoses Final diagnoses:  None    Rx / DC Orders ED Discharge Orders     None        Milton Ferguson, MD 11/05/20 [phone]

## 2020-11-01 NOTE — ED Triage Notes (Signed)
Pt c/o sob off and on since yesterday, had some CP earlier but denies now. Pt on home O2.

## 2020-11-01 NOTE — Discharge Instructions (Addendum)
Follow-up with your family doctor this week for recheck.  Return if any problems.

## 2020-11-02 NOTE — ED Provider Notes (Signed)
  Physical Exam  BP (!) 152/85 (BP Location: Right Arm)   Pulse 90   Temp 97.6 F (36.4 C) (Oral)   Resp 17   Ht '5\' 6"'$  (1.676 m)   Wt 51.7 kg   SpO2 100%   BMI 18.40 kg/m   Physical Exam  ED Course/Procedures     Procedures  MDM  Care signed out to me awaiting results of a CTA of the chest.  The study has returned and is unremarkable.  Patient evaluated and appears comfortable.  Patient seems appropriate for discharge with as needed return.       [name], MD 11/02/20 [phone]

## 2020-11-06 DIAGNOSIS — I5040 Unspecified combined systolic (congestive) and diastolic (congestive) heart failure: Secondary | ICD-10-CM | POA: Diagnosis not present

## 2020-11-29 ENCOUNTER — Emergency Department (HOSPITAL_COMMUNITY)
Admission: EM | Admit: 2020-11-29 | Discharge: 2020-11-29 | Disposition: A | Discharge status: Home / Self Care | Payer: Medicare Other | Attending: Emergency Medicine | Admitting: Emergency Medicine

## 2020-11-29 ENCOUNTER — Emergency Department (HOSPITAL_COMMUNITY): Payer: Medicare Other

## 2020-11-29 ENCOUNTER — Other Ambulatory Visit: Payer: Self-pay

## 2020-11-29 ENCOUNTER — Encounter (HOSPITAL_COMMUNITY): Payer: Self-pay

## 2020-11-29 DIAGNOSIS — N1832 Chronic kidney disease, stage 3b: Secondary | ICD-10-CM | POA: Insufficient documentation

## 2020-11-29 DIAGNOSIS — Z743 Need for continuous supervision: Secondary | ICD-10-CM | POA: Diagnosis not present

## 2020-11-29 DIAGNOSIS — J9621 Acute and chronic respiratory failure with hypoxia: Secondary | ICD-10-CM | POA: Diagnosis not present

## 2020-11-29 DIAGNOSIS — I5033 Acute on chronic diastolic (congestive) heart failure: Secondary | ICD-10-CM | POA: Diagnosis not present

## 2020-11-29 DIAGNOSIS — R109 Unspecified abdominal pain: Secondary | ICD-10-CM | POA: Diagnosis not present

## 2020-11-29 DIAGNOSIS — Z79899 Other long term (current) drug therapy: Secondary | ICD-10-CM | POA: Diagnosis not present

## 2020-11-29 DIAGNOSIS — Z85828 Personal history of other malignant neoplasm of skin: Secondary | ICD-10-CM | POA: Insufficient documentation

## 2020-11-29 DIAGNOSIS — E1122 Type 2 diabetes mellitus with diabetic chronic kidney disease: Secondary | ICD-10-CM | POA: Insufficient documentation

## 2020-11-29 DIAGNOSIS — I517 Cardiomegaly: Secondary | ICD-10-CM | POA: Diagnosis not present

## 2020-11-29 DIAGNOSIS — R069 Unspecified abnormalities of breathing: Secondary | ICD-10-CM | POA: Diagnosis not present

## 2020-11-29 DIAGNOSIS — R1084 Generalized abdominal pain: Secondary | ICD-10-CM | POA: Diagnosis not present

## 2020-11-29 DIAGNOSIS — I130 Hypertensive heart and chronic kidney disease with heart failure and stage 1 through stage 4 chronic kidney disease, or unspecified chronic kidney disease: Secondary | ICD-10-CM | POA: Diagnosis not present

## 2020-11-29 DIAGNOSIS — R6889 Other general symptoms and signs: Secondary | ICD-10-CM | POA: Diagnosis not present

## 2020-11-29 LAB — COMPREHENSIVE METABOLIC PANEL
ALT: 15 U/L (ref 0–44)
AST: 26 U/L (ref 15–41)
Albumin: 3.3 g/dL — ABNORMAL LOW (ref 3.5–5.0)
Alkaline Phosphatase: 85 U/L (ref 38–126)
Anion gap: 8 (ref 5–15)
BUN: 24 mg/dL — ABNORMAL HIGH (ref 8–23)
CO2: 25 mmol/L (ref 22–32)
Calcium: 8.2 mg/dL — ABNORMAL LOW (ref 8.9–10.3)
Chloride: 106 mmol/L (ref 98–111)
Creatinine, Ser: 1.85 mg/dL — ABNORMAL HIGH (ref 0.44–1.00)
GFR, Estimated: 26 mL/min — ABNORMAL LOW (ref >60)
Glucose, Bld: 113 mg/dL — ABNORMAL HIGH (ref 70–99)
Potassium: 4.2 mmol/L (ref 3.5–5.1)
Sodium: 139 mmol/L (ref 135–145)
Total Bilirubin: 0.2 mg/dL — ABNORMAL LOW (ref 0.3–1.2)
Total Protein: 6.8 g/dL (ref 6.5–8.1)

## 2020-11-29 LAB — CBC WITH DIFFERENTIAL/PLATELET
Abs Immature Granulocytes: 0.02 10*3/uL (ref 0.00–0.07)
Basophils Absolute: 0 10*3/uL (ref 0.0–0.1)
Basophils Relative: 1 %
Eosinophils Absolute: 0.1 10*3/uL (ref 0.0–0.5)
Eosinophils Relative: 2 %
HCT: 33.1 % — ABNORMAL LOW (ref 36.0–46.0)
Hemoglobin: 10.3 g/dL — ABNORMAL LOW (ref 12.0–15.0)
Immature Granulocytes: 0 %
Lymphocytes Relative: 19 %
Lymphs Abs: 1.1 10*3/uL (ref 0.7–4.0)
MCH: 29.6 pg (ref 26.0–34.0)
MCHC: 31.1 g/dL (ref 30.0–36.0)
MCV: 95.1 fL (ref 80.0–100.0)
Monocytes Absolute: 0.7 10*3/uL (ref 0.1–1.0)
Monocytes Relative: 12 %
Neutro Abs: 3.9 10*3/uL (ref 1.7–7.7)
Neutrophils Relative %: 66 %
Platelets: 225 10*3/uL (ref 150–400)
RBC: 3.48 MIL/uL — ABNORMAL LOW (ref 3.87–5.11)
RDW: 14.6 % (ref 11.5–15.5)
WBC: 5.9 10*3/uL (ref 4.0–10.5)
nRBC: 0 % (ref 0.0–0.2)

## 2020-11-29 LAB — LIPASE, BLOOD: Lipase: 21 U/L (ref 11–51)

## 2020-11-29 IMAGING — DX DG CHEST 1V PORT
1 series · 1 of 1 positions shown · non-contrast
Comparison: [DATE]

CLINICAL DATA: Right flank pain

EXAM:
PORTABLE CHEST 1 VIEW

[chest ap]
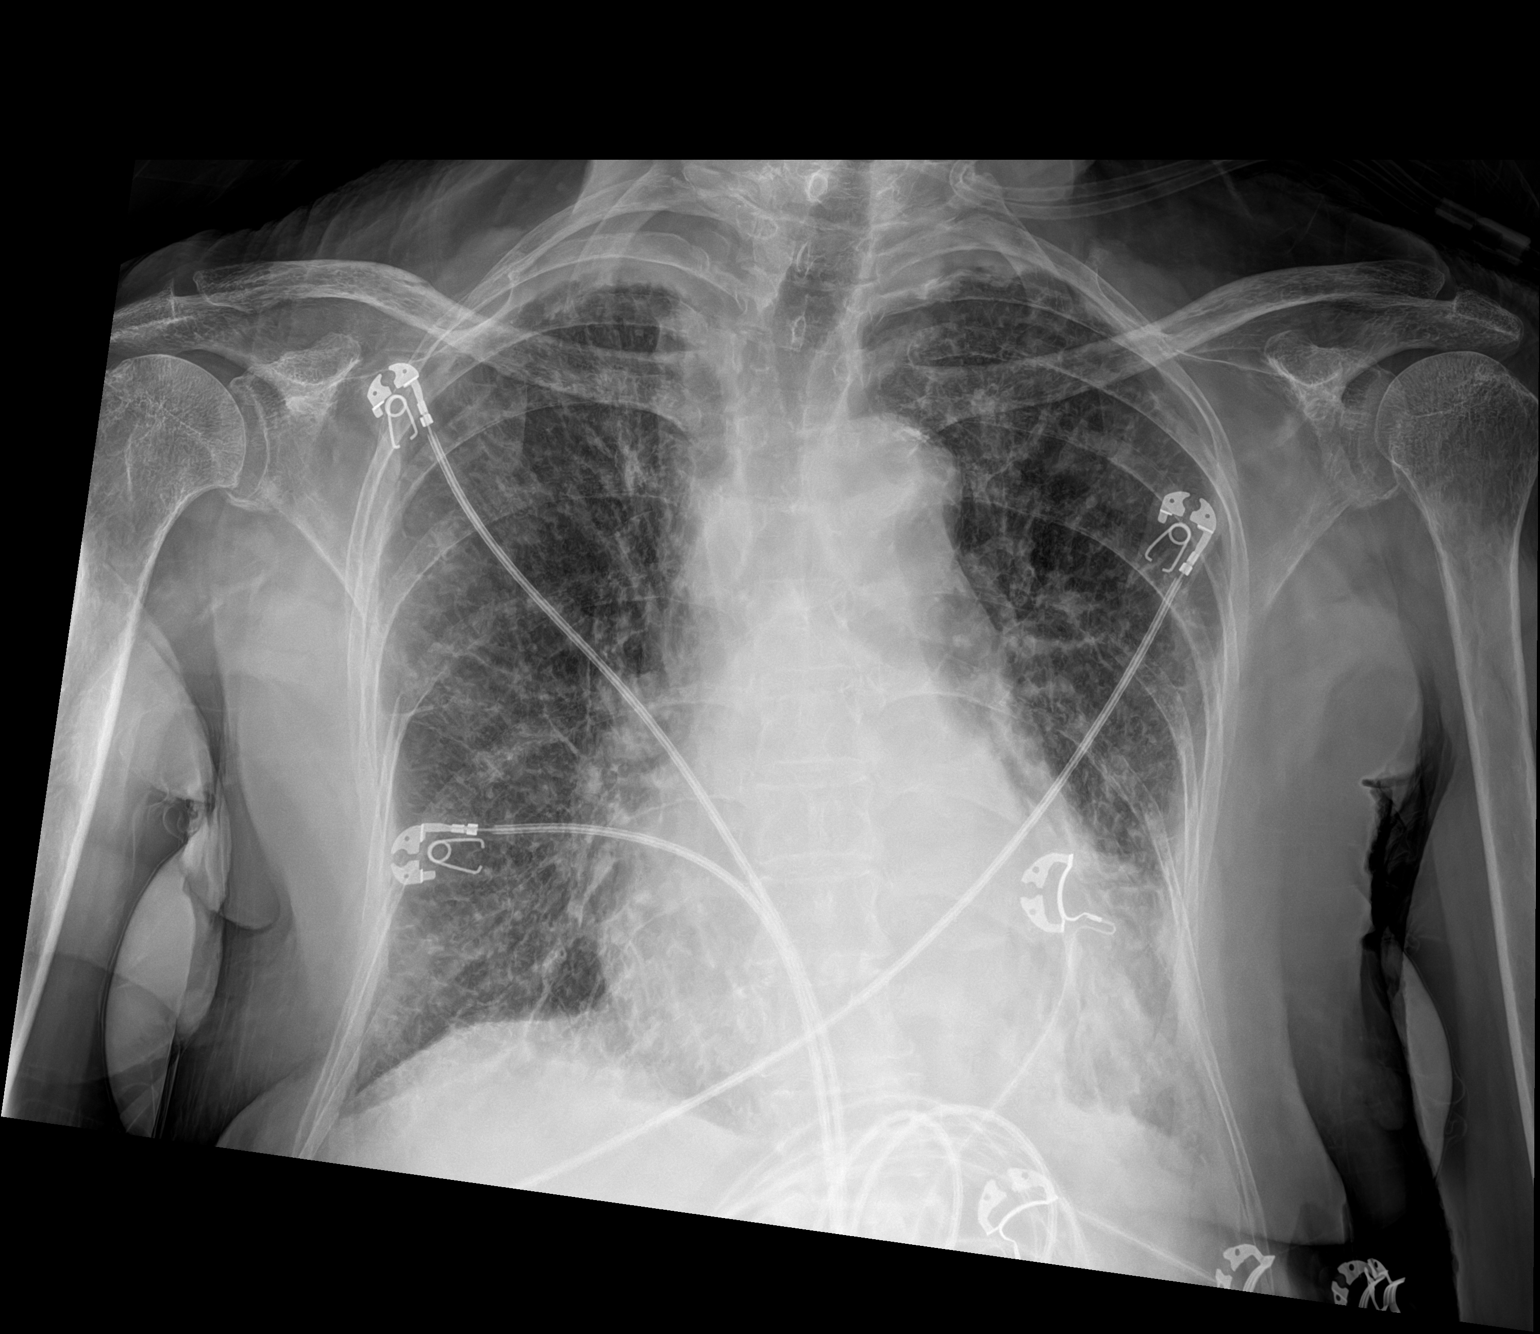

[1 of 1 positions shown; findings below may reference images not displayed]

FINDINGS: Chronic cardiomegaly. Stable mediastinal contours. Interstitial
coarsening with Kerley lines. Interstitial coarsening is above prior
baseline. Chronic biapical pleural based thickening/scarring.
Chronic retrocardiac streaky density with volume loss.
IMPRESSION: Increased interstitial markings which are diffuse and favor edema.

## 2020-11-29 MED ORDER — LACTATED RINGERS IV BOLUS
1000.0000 mL | Freq: Once | INTRAVENOUS | Status: AC
  Administered 2020-11-29: 1000 mL via INTRAVENOUS

## 2020-11-29 MED ORDER — ONDANSETRON HCL 4 MG/2ML IJ SOLN
4.0000 mg | Freq: Once | INTRAMUSCULAR | Status: AC
  Administered 2020-11-29: 4 mg via INTRAVENOUS
  Filled 2020-11-29: qty 2

## 2020-11-29 NOTE — ED Notes (Signed)
PT DENIES ABD PAIN RIGHT NOW PT DENIES NAUSEA RIGHT NOW Pt reports pain stopped on the way over here. Pt reports nausea stopped on the way over here.

## 2020-11-29 NOTE — ED Notes (Signed)
Pt given gingerale- tolerating well- Dr Dayna Barker at bedside. Daughter at bedside does not want pt to eat prior to taking her levothyroxine - offering food held at this time.

## 2020-11-29 NOTE — ED Triage Notes (Signed)
BIB [facility] c/o R flank pain and diarrhea x2 days.

## 2020-11-30 ENCOUNTER — Ambulatory Visit: Payer: Medicare Other | Admitting: Family Medicine

## 2020-11-30 NOTE — ED Provider Notes (Signed)
[facility] EMERGENCY DEPARTMENT Provider Note   CSN: [id number] Arrival date & time: 11/29/20  0213     History Chief Complaint  Patient presents with   Abdominal Pain    Renee Blake is a 87 y.o. female.  Initially came in for sob and abdominal pain. On daughter's arrival, states that this happens sometimes with anxiety. No recent illnesses. Feels well on arrival here.    Abdominal Pain Pain location:  Generalized Pain quality: not aching, not dull and not sharp   Pain radiates to:  Does not radiate Pain severity:  Mild Timing:  Constant Chronicity:  New Context: not alcohol use, not medication withdrawal, not retching and not sick contacts   Relieved by:  None tried Worsened by:  Nothing Ineffective treatments:  None tried     Past Medical History:  Diagnosis Date   Acute blood loss anemia    Atrial fibrillation ([place])    a. diagnosed in 12/2018. Rate-control pursued given not a candidate for anticoagulation   Chronic diarrhea    Dilation of biliary tract    Duodenal stricture    Esophageal stricture    Gastric ulcer    GI bleed    Hyperlipemia    Hypertension    Microscopic colitis    Nephrolithiasis    Pancreatic duct dilated    Schatzki's ring    Skin cancer    Sliding hiatal hernia    Vertigo     Patient Active Problem List   Diagnosis Date Noted   Thyroid disease 07/25/2020   Acute pulmonary edema (HCC)    Longstanding persistent atrial fibrillation ([place])    Palliative care by specialist    Encounter for hospice care discussion    Protein-calorie malnutrition, severe 04/27/2019   Acute on chronic diastolic (congestive) heart failure ([place]) 04/27/2019   Normocytic anemia 04/26/2019   Goals of care, counseling/discussion    Palliative care encounter    Hypotension 04/17/2019   Debility 03/16/2019   Shortness of breath 03/16/2019   Cachexia ([place]) 03/16/2019   Permanent atrial fibrillation ([place])    Hypoxia 03/15/2019   CHF exacerbation  ([place]) 03/14/2019   Bilateral pleural effusion 03/04/2019   Chronic atrial fibrillation ([place]) 03/04/2019   DNR (do not resuscitate) 03/04/2019   Acute on chronic diastolic congestive heart failure ([place]) 03/02/2019   Chronic diastolic heart failure ([place]) 03/02/2019   Chronic renal failure, stage 3b ([place]) 03/02/2019   Acute respiratory failure with hypoxia ([place]) 03/02/2019   Acute exacerbation of CHF (congestive heart failure) ([place]) 01/27/2019   Atrial fibrillation with RVR ([place]) 01/27/2019   Iron deficiency anemia due to chronic blood loss---H/o Gi Bleed 01/27/2019   Vaginal mass 07/29/2017   Heme positive stool 07/25/2017   Cellulitis 12/10/2016   Facial cellulitis    Erroneous encounter - disregard 03/17/2015   AKI (acute kidney injury) ([place])    Metabolic acidosis 0000000   Acute renal failure syndrome ([place])    Renal failure 08/22/2014   Hyperkalemia 08/22/2014   UTI (urinary tract infection) 08/22/2014   Acute renal failure ([place]) 08/22/2014   Dyspnea 03/15/2014   Type 2 diabetes mellitus ([place]) [id number]   Acute diastolic congestive heart failure ([place]) 06/07/2013   Syncope 03/26/2013   Rectal bleeding 03/26/2013   Dehydration 03/26/2013   Generalized weakness 03/26/2013   Malnutrition of moderate degree ([place]) 03/01/2013   Diarrhea 02/28/2013   Loss of weight 02/28/2013   H/o Prior duodenal ulcer with hemorrhage 02/27/2013   H/o Prior Lower GI bleed (  anorectal Ca) 02/26/2013   Acute blood loss anemia 02/26/2013   UTI (lower urinary tract infection) 02/26/2013   Hypertension 02/26/2013    Past Surgical History:  Procedure Laterality Date   BIOPSY  07/28/2017   Procedure: BIOPSY;  Surgeon: Rogene Houston, MD;  [facility];  Service: Endoscopy;;  rectum   COLONOSCOPY N/A 07/28/2017   Procedure: COLONOSCOPY;  Surgeon: Rogene Houston, MD;  [facility];  Service: Endoscopy;  Laterality: N/A;   ESOPHAGOGASTRODUODENOSCOPY N/A 02/27/2013   Procedure:  ESOPHAGOGASTRODUODENOSCOPY (EGD);  Surgeon: Ladene Artist, MD;  Location: [facility] ENDOSCOPY;  Service: Endoscopy;  Laterality: N/A;   ESOPHAGOGASTRODUODENOSCOPY N/A 03/28/2013   Procedure: ESOPHAGOGASTRODUODENOSCOPY (EGD);  Surgeon: Rogene Houston, MD;  [facility];  Service: Endoscopy;  Laterality: N/A;   HOT HEMOSTASIS N/A 02/27/2013   Procedure: HOT HEMOSTASIS (ARGON PLASMA COAGULATION/BICAP);  Surgeon: Ladene Artist, MD;  Location: [facility] ENDOSCOPY;  Service: Endoscopy;  Laterality: N/A;   NOSE SURGERY     for skin cancer   POLYPECTOMY  07/28/2017   Procedure: POLYPECTOMY;  Surgeon: Rogene Houston, MD;  [facility];  Service: Endoscopy;;  colon      OB History     Gravida  6   Para  6   Term  6   Preterm      AB      Living  6      SAB      IAB      Ectopic      Multiple      Live Births              Family History  Problem Relation Age of Onset   Heart attack Father     Social History   Tobacco Use   Smoking status: Never   Smokeless tobacco: Never  Vaping Use   Vaping Use: Never used  Substance Use Topics   Alcohol use: No    Alcohol/week: 0.0 standard drinks   Drug use: No    Home Medications Prior to Admission medications   Medication Sig Start Date End Date Taking? Authorizing Provider  acetaminophen (TYLENOL) 500 MG tablet Take 1,000 mg by mouth every 6 (six) hours as needed.    [provider]  ALPRAZolam Duanne Moron) 0.25 MG tablet Take 1 tablet (0.25 mg total) by mouth 2 (two) times daily as needed. for anxiety 04/20/19   Manuella Ghazi, Pratik D, DO  ALPRAZolam Duanne Moron) 0.5 MG tablet Take 0.5 mg by mouth 2 (two) times daily as needed. 10/11/20   [provider]  diltiazem (CARDIZEM SR) 90 MG 12 hr capsule Take 1 capsule (90 mg total) by mouth 2 (two) times daily. 05/17/20   Verta Ellen., NP  diltiazem (CARDIZEM) 90 MG tablet Take 90 mg by mouth 2 (two) times daily. 10/11/20   [provider]   levothyroxine (SYNTHROID) 50 MCG tablet Take 50 mcg by mouth daily. 07/11/20   [provider]  loperamide (IMODIUM A-D) 2 MG tablet Take 2 mg by mouth daily as needed for diarrhea or loose stools.    [provider]  metoprolol succinate (TOPROL-XL) 25 MG 24 hr tablet Take 1 tablet (25 mg total) by mouth daily. 05/17/20 09/13/20  Verta Ellen., NP  ondansetron (ZOFRAN) 4 MG tablet Take 1 tablet (4 mg total) by mouth every 8 (eight) hours as needed for nausea or vomiting. 03/27/20   Hayden Rasmussen, MD  ondansetron (  ZOFRAN-ODT) 4 MG disintegrating tablet Take by mouth. 09/26/20   [provider]  pantoprazole (PROTONIX) 40 MG tablet Take 40 mg by mouth daily. 10/11/20   [provider]  potassium chloride SA (KLOR-CON) 20 MEQ tablet Take 1 tablet (20 mEq total) by mouth daily. 05/17/20   Verta Ellen., NP  torsemide (DEMADEX) 20 MG tablet Take 2 tablets (40 mg total) by mouth every morning. 09/05/20   Verta Ellen., NP  traMADol Veatrice Bourbon) 50 MG tablet Take 50 mg by mouth every 6 (six) hours as needed. 06/29/20   [provider]    Allergies    Clonazepam, Codeine, and Sulfa antibiotics  Review of Systems   Review of Systems  Gastrointestinal:  Positive for abdominal pain.  All other systems reviewed and are negative.  Physical Exam Updated Vital Signs BP (!) 138/92   Pulse 77   Temp 97.6 F (36.4 C)   Resp 19   Ht '5\' 6"'$  (1.676 m)   Wt 55.2 kg   SpO2 100%   BMI 19.66 kg/m   Physical Exam HENT:     Head: Normocephalic.  Eyes:     Extraocular Movements: Extraocular movements intact.  Cardiovascular:     Rate and Rhythm: Normal rate.  Pulmonary:     Effort: Pulmonary effort is normal.  Abdominal:     General: Abdomen is flat.     Palpations: There is no shifting dullness.  Musculoskeletal:        General: No swelling or tenderness. Normal range of motion.  Skin:    General: Skin is warm and dry.  Neurological:      General: No focal deficit present.    ED Results / Procedures / Treatments   Labs (all labs ordered are listed, but only abnormal results are displayed) Labs Reviewed  CBC WITH DIFFERENTIAL/PLATELET - Abnormal; Notable for the following components:      Result Value   RBC 3.48 (*)    Hemoglobin 10.3 (*)    HCT 33.1 (*)    All other components within normal limits  COMPREHENSIVE METABOLIC PANEL - Abnormal; Notable for the following components:   Glucose, Bld 113 (*)    BUN 24 (*)    Creatinine, Ser 1.85 (*)    Calcium 8.2 (*)    Albumin 3.3 (*)    Total Bilirubin 0.2 (*)    GFR, Estimated 26 (*)    All other components within normal limits  LIPASE, BLOOD    EKG EKG Interpretation  Date/Time:  Wednesday November 29 2020 02:20:18 EDT Ventricular Rate:  61 PR Interval:    QRS Duration: 142 QT Interval:  457 QTC Calculation: 461 R Axis:   -40 Text Interpretation: Atrial fibrillation RBBB and LAFB Baseline wander in lead(s) II III aVF Confirmed by Merrily Pew [phone]) on 11/30/2020 5:23:11 AM  Radiology DG Chest Portable 1 View  Result Date: 11/29/2020 CLINICAL DATA:  Right flank pain EXAM: PORTABLE CHEST 1 VIEW COMPARISON:  11/01/2020 FINDINGS: Chronic cardiomegaly. Stable mediastinal contours. Interstitial coarsening with [employer]. Interstitial coarsening is above prior baseline. Chronic biapical pleural based thickening/scarring. Chronic retrocardiac streaky density with volume loss. IMPRESSION: Increased interstitial markings which are diffuse and favor edema. Electronically Signed   By: Monte Fantasia M.D.   On: 11/29/2020 04:45    Procedures Procedures   Medications Ordered in ED Medications  ondansetron (ZOFRAN) injection 4 mg (4 mg Intravenous Given 11/29/20 0405)  lactated ringers bolus 1,000 mL (0 mLs Intravenous  Stopped 11/29/20 [phone]    ED Course  I have reviewed the triage vital signs and the nursing notes.  Pertinent labs & imaging results that were  available during my care of the patient were reviewed by me and considered in my medical decision making (see chart for details).    MDM Rules/Calculators/A&P                         Overall appears well. Has been here for multiple hours without changes. Daughter feels she is at her baseline. Low suspicion for signficant intraabdominal pathology with normal labs and 4.5 hours without symptoms.    Final Clinical Impression(s) / ED Diagnoses Final diagnoses:  Abdominal pain, unspecified abdominal location    Rx / DC Orders ED Discharge Orders     None        Mariano Doshi, Corene Cornea, MD 11/30/20 [phone]

## 2020-12-05 DIAGNOSIS — R600 Localized edema: Secondary | ICD-10-CM | POA: Diagnosis not present

## 2020-12-05 DIAGNOSIS — K219 Gastro-esophageal reflux disease without esophagitis: Secondary | ICD-10-CM | POA: Diagnosis not present

## 2020-12-05 DIAGNOSIS — R197 Diarrhea, unspecified: Secondary | ICD-10-CM | POA: Diagnosis not present

## 2020-12-05 DIAGNOSIS — W19XXXA Unspecified fall, initial encounter: Secondary | ICD-10-CM | POA: Diagnosis not present

## 2020-12-05 DIAGNOSIS — E039 Hypothyroidism, unspecified: Secondary | ICD-10-CM | POA: Diagnosis not present

## 2020-12-05 DIAGNOSIS — I509 Heart failure, unspecified: Secondary | ICD-10-CM | POA: Diagnosis not present

## 2020-12-05 DIAGNOSIS — I10 Essential (primary) hypertension: Secondary | ICD-10-CM | POA: Diagnosis not present

## 2020-12-05 DIAGNOSIS — Z7409 Other reduced mobility: Secondary | ICD-10-CM | POA: Diagnosis not present

## 2020-12-07 ENCOUNTER — Encounter (INDEPENDENT_AMBULATORY_CARE_PROVIDER_SITE_OTHER): Payer: Self-pay | Admitting: *Deleted

## 2020-12-07 DIAGNOSIS — I5040 Unspecified combined systolic (congestive) and diastolic (congestive) heart failure: Secondary | ICD-10-CM | POA: Diagnosis not present

## 2020-12-08 DIAGNOSIS — N1832 Chronic kidney disease, stage 3b: Secondary | ICD-10-CM | POA: Diagnosis not present

## 2020-12-08 DIAGNOSIS — R600 Localized edema: Secondary | ICD-10-CM | POA: Diagnosis not present

## 2020-12-08 DIAGNOSIS — I4891 Unspecified atrial fibrillation: Secondary | ICD-10-CM | POA: Diagnosis not present

## 2020-12-08 DIAGNOSIS — E039 Hypothyroidism, unspecified: Secondary | ICD-10-CM | POA: Diagnosis not present

## 2020-12-08 DIAGNOSIS — D509 Iron deficiency anemia, unspecified: Secondary | ICD-10-CM | POA: Diagnosis not present

## 2020-12-08 DIAGNOSIS — W19XXXA Unspecified fall, initial encounter: Secondary | ICD-10-CM | POA: Diagnosis not present

## 2020-12-08 DIAGNOSIS — I509 Heart failure, unspecified: Secondary | ICD-10-CM | POA: Diagnosis not present

## 2020-12-08 DIAGNOSIS — I10 Essential (primary) hypertension: Secondary | ICD-10-CM | POA: Diagnosis not present

## 2020-12-12 DIAGNOSIS — E039 Hypothyroidism, unspecified: Secondary | ICD-10-CM | POA: Diagnosis not present

## 2020-12-12 DIAGNOSIS — R600 Localized edema: Secondary | ICD-10-CM | POA: Diagnosis not present

## 2020-12-12 DIAGNOSIS — I10 Essential (primary) hypertension: Secondary | ICD-10-CM | POA: Diagnosis not present

## 2020-12-12 DIAGNOSIS — W19XXXA Unspecified fall, initial encounter: Secondary | ICD-10-CM | POA: Diagnosis not present

## 2020-12-12 DIAGNOSIS — N1832 Chronic kidney disease, stage 3b: Secondary | ICD-10-CM | POA: Diagnosis not present

## 2020-12-12 DIAGNOSIS — I4891 Unspecified atrial fibrillation: Secondary | ICD-10-CM | POA: Diagnosis not present

## 2020-12-12 DIAGNOSIS — D509 Iron deficiency anemia, unspecified: Secondary | ICD-10-CM | POA: Diagnosis not present

## 2020-12-12 DIAGNOSIS — I509 Heart failure, unspecified: Secondary | ICD-10-CM | POA: Diagnosis not present

## 2020-12-14 DIAGNOSIS — R600 Localized edema: Secondary | ICD-10-CM | POA: Diagnosis not present

## 2020-12-14 DIAGNOSIS — I10 Essential (primary) hypertension: Secondary | ICD-10-CM | POA: Diagnosis not present

## 2020-12-14 DIAGNOSIS — W19XXXA Unspecified fall, initial encounter: Secondary | ICD-10-CM | POA: Diagnosis not present

## 2020-12-14 DIAGNOSIS — D509 Iron deficiency anemia, unspecified: Secondary | ICD-10-CM | POA: Diagnosis not present

## 2020-12-14 DIAGNOSIS — E039 Hypothyroidism, unspecified: Secondary | ICD-10-CM | POA: Diagnosis not present

## 2020-12-14 DIAGNOSIS — N1832 Chronic kidney disease, stage 3b: Secondary | ICD-10-CM | POA: Diagnosis not present

## 2020-12-14 DIAGNOSIS — I509 Heart failure, unspecified: Secondary | ICD-10-CM | POA: Diagnosis not present

## 2020-12-14 DIAGNOSIS — I4891 Unspecified atrial fibrillation: Secondary | ICD-10-CM | POA: Diagnosis not present

## 2020-12-18 ENCOUNTER — Ambulatory Visit (INDEPENDENT_AMBULATORY_CARE_PROVIDER_SITE_OTHER): Payer: Medicare Other

## 2020-12-18 ENCOUNTER — Telehealth: Payer: Self-pay

## 2020-12-18 ENCOUNTER — Other Ambulatory Visit: Payer: Self-pay

## 2020-12-18 DIAGNOSIS — R339 Retention of urine, unspecified: Secondary | ICD-10-CM | POA: Diagnosis not present

## 2020-12-18 LAB — URINALYSIS, ROUTINE W REFLEX MICROSCOPIC
Bilirubin, UA: NEGATIVE
Glucose, UA: NEGATIVE
Ketones, UA: NEGATIVE
Nitrite, UA: NEGATIVE
Protein,UA: NEGATIVE
Specific Gravity, UA: 1.01 (ref 1.005–1.030)
Urobilinogen, Ur: 0.2 mg/dL (ref 0.2–1.0)
pH, UA: 6.5 (ref 5.0–7.5)

## 2020-12-18 LAB — MICROSCOPIC EXAMINATION
Renal Epithel, UA: NONE SEEN /hpf
WBC, UA: >30 /hpf — AB (ref 0–5)

## 2020-12-18 LAB — BLADDER SCAN AMB NON-IMAGING: Scan Result: 125

## 2020-12-18 NOTE — Telephone Encounter (Signed)
Patient's daughter calling to let Dr. Alyson Ingles know she's not able to urinate much.  Her lower abdomen is hurting as well.  Please advise.  Call back daughter:  [phone] - Claudean Severance, Helene Kelp

## 2020-12-18 NOTE — Progress Notes (Signed)
Patient came in today with c/o lower abd pain and urinary frequency and not feeling like she empties.  First PVR showed 354. After voiding 125cc remained. Patient attempted to void in nuns cap to provide sample to assess if patient had UTI. Patient missed the nuns caps.  Daughter will bring back urine sample later today for testing.

## 2020-12-18 NOTE — Telephone Encounter (Signed)
Appt made for [place] PVR. Daughter aware.

## 2020-12-18 NOTE — Telephone Encounter (Signed)
Pt's daughter arrived to drop off a urine for testing & asked that her chart be updated to reflect a change in pharmacy.  The pt is now set up to use [facility] in [place], [place].  Thank you

## 2020-12-20 ENCOUNTER — Telehealth: Payer: Self-pay

## 2020-12-20 DIAGNOSIS — I10 Essential (primary) hypertension: Secondary | ICD-10-CM | POA: Diagnosis not present

## 2020-12-20 DIAGNOSIS — N390 Urinary tract infection, site not specified: Secondary | ICD-10-CM

## 2020-12-20 DIAGNOSIS — I4891 Unspecified atrial fibrillation: Secondary | ICD-10-CM | POA: Diagnosis not present

## 2020-12-20 DIAGNOSIS — D509 Iron deficiency anemia, unspecified: Secondary | ICD-10-CM | POA: Diagnosis not present

## 2020-12-20 DIAGNOSIS — E039 Hypothyroidism, unspecified: Secondary | ICD-10-CM | POA: Diagnosis not present

## 2020-12-20 DIAGNOSIS — W19XXXA Unspecified fall, initial encounter: Secondary | ICD-10-CM | POA: Diagnosis not present

## 2020-12-20 DIAGNOSIS — I509 Heart failure, unspecified: Secondary | ICD-10-CM | POA: Diagnosis not present

## 2020-12-20 DIAGNOSIS — N1832 Chronic kidney disease, stage 3b: Secondary | ICD-10-CM | POA: Diagnosis not present

## 2020-12-20 DIAGNOSIS — R600 Localized edema: Secondary | ICD-10-CM | POA: Diagnosis not present

## 2020-12-20 LAB — URINE CULTURE

## 2020-12-20 MED ORDER — CEFUROXIME AXETIL 250 MG PO TABS: 250.0000 mg | ORAL_TABLET | Freq: Two times a day (BID) | ORAL | 0 refills | Status: DC

## 2020-12-20 NOTE — Telephone Encounter (Signed)
Daughter made aware of positive urine culture and rx sent to pharmacy.

## 2020-12-22 DIAGNOSIS — E039 Hypothyroidism, unspecified: Secondary | ICD-10-CM | POA: Diagnosis not present

## 2020-12-22 DIAGNOSIS — I4891 Unspecified atrial fibrillation: Secondary | ICD-10-CM | POA: Diagnosis not present

## 2020-12-22 DIAGNOSIS — I10 Essential (primary) hypertension: Secondary | ICD-10-CM | POA: Diagnosis not present

## 2020-12-22 DIAGNOSIS — W19XXXA Unspecified fall, initial encounter: Secondary | ICD-10-CM | POA: Diagnosis not present

## 2020-12-22 DIAGNOSIS — N1832 Chronic kidney disease, stage 3b: Secondary | ICD-10-CM | POA: Diagnosis not present

## 2020-12-22 DIAGNOSIS — D509 Iron deficiency anemia, unspecified: Secondary | ICD-10-CM | POA: Diagnosis not present

## 2020-12-22 DIAGNOSIS — I509 Heart failure, unspecified: Secondary | ICD-10-CM | POA: Diagnosis not present

## 2020-12-22 DIAGNOSIS — R600 Localized edema: Secondary | ICD-10-CM | POA: Diagnosis not present

## 2020-12-27 DIAGNOSIS — D509 Iron deficiency anemia, unspecified: Secondary | ICD-10-CM | POA: Diagnosis not present

## 2020-12-27 DIAGNOSIS — I4891 Unspecified atrial fibrillation: Secondary | ICD-10-CM | POA: Diagnosis not present

## 2020-12-27 DIAGNOSIS — N1832 Chronic kidney disease, stage 3b: Secondary | ICD-10-CM | POA: Diagnosis not present

## 2020-12-27 DIAGNOSIS — E039 Hypothyroidism, unspecified: Secondary | ICD-10-CM | POA: Diagnosis not present

## 2020-12-27 DIAGNOSIS — I10 Essential (primary) hypertension: Secondary | ICD-10-CM | POA: Diagnosis not present

## 2020-12-27 DIAGNOSIS — W19XXXA Unspecified fall, initial encounter: Secondary | ICD-10-CM | POA: Diagnosis not present

## 2020-12-27 DIAGNOSIS — I509 Heart failure, unspecified: Secondary | ICD-10-CM | POA: Diagnosis not present

## 2020-12-27 DIAGNOSIS — R600 Localized edema: Secondary | ICD-10-CM | POA: Diagnosis not present

## 2020-12-28 DIAGNOSIS — I10 Essential (primary) hypertension: Secondary | ICD-10-CM | POA: Diagnosis not present

## 2020-12-28 DIAGNOSIS — Z0001 Encounter for general adult medical examination with abnormal findings: Secondary | ICD-10-CM | POA: Diagnosis not present

## 2020-12-28 DIAGNOSIS — E039 Hypothyroidism, unspecified: Secondary | ICD-10-CM | POA: Diagnosis not present

## 2020-12-28 DIAGNOSIS — Z131 Encounter for screening for diabetes mellitus: Secondary | ICD-10-CM | POA: Diagnosis not present

## 2020-12-29 DIAGNOSIS — J9621 Acute and chronic respiratory failure with hypoxia: Secondary | ICD-10-CM | POA: Diagnosis not present

## 2021-01-01 DIAGNOSIS — E039 Hypothyroidism, unspecified: Secondary | ICD-10-CM | POA: Diagnosis not present

## 2021-01-01 DIAGNOSIS — I509 Heart failure, unspecified: Secondary | ICD-10-CM | POA: Diagnosis not present

## 2021-01-01 DIAGNOSIS — R809 Proteinuria, unspecified: Secondary | ICD-10-CM | POA: Diagnosis not present

## 2021-01-01 DIAGNOSIS — N1832 Chronic kidney disease, stage 3b: Secondary | ICD-10-CM | POA: Diagnosis not present

## 2021-01-01 DIAGNOSIS — E785 Hyperlipidemia, unspecified: Secondary | ICD-10-CM | POA: Diagnosis not present

## 2021-01-01 DIAGNOSIS — R300 Dysuria: Secondary | ICD-10-CM | POA: Diagnosis not present

## 2021-01-01 DIAGNOSIS — I10 Essential (primary) hypertension: Secondary | ICD-10-CM | POA: Diagnosis not present

## 2021-01-01 DIAGNOSIS — Z8679 Personal history of other diseases of the circulatory system: Secondary | ICD-10-CM | POA: Diagnosis not present

## 2021-01-01 DIAGNOSIS — E611 Iron deficiency: Secondary | ICD-10-CM | POA: Diagnosis not present

## 2021-01-01 DIAGNOSIS — R531 Weakness: Secondary | ICD-10-CM | POA: Diagnosis not present

## 2021-01-04 ENCOUNTER — Ambulatory Visit (INDEPENDENT_AMBULATORY_CARE_PROVIDER_SITE_OTHER): Payer: Medicare Other | Admitting: Gastroenterology

## 2021-01-04 ENCOUNTER — Other Ambulatory Visit: Payer: Self-pay

## 2021-01-04 ENCOUNTER — Encounter (INDEPENDENT_AMBULATORY_CARE_PROVIDER_SITE_OTHER): Payer: Self-pay | Admitting: Gastroenterology

## 2021-01-04 DIAGNOSIS — R110 Nausea: Secondary | ICD-10-CM

## 2021-01-04 DIAGNOSIS — Z85048 Personal history of other malignant neoplasm of rectum, rectosigmoid junction, and anus: Secondary | ICD-10-CM | POA: Diagnosis not present

## 2021-01-04 NOTE — Progress Notes (Signed)
[name], M.D. Gastroenterology & Hepatology [facility] [address] [place], [place] [address]  Primary Care Physician: [name], MD [place] [id number]  I will communicate my assessment and recommendations to the referring MD via EMR.  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 female with past medical history of atrial fibrillation, esophageal stricture, hypertension, hyperlipidemia, ?  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  The patient was last seen on 01/31/2020. At that time, the patient was scheduled for a colonoscopy for evaluation of recurrent bleeding but she postponed this procedure as she could not tolerate the bowel prep and her bleeding subsided.  Patient comes to the office with her daughters.  Patient reports that since April 2022 she has presented recurrent episodes of nausea without vomiting. She is currently taking Zofran, usually 2 times a day but takes it as needed. Has some belching episodes but no heartburn. No steady weight decline ascitic fluid taste during the. The patient denies having any nausea, vomiting, fever, chills, hematochezia, melena, hematemesis, abdominal distention, abdominal pain, diarrhea, jaundice, pruritus or weight loss.  She takes Imodium twice a day for management of chronic diarrhea. Can have up to 3 bowel movements per day on average. Patient reports that she has only presented occasional blood when wiping, usually when she is too constipated which is infrequent.  Notably, as part of evaluation of her constant nausea, she was seen in the ER in April 2022 and  underwent a CT of the abdomen that showed the following findings  CT abdomen without IV contrast 07/29/2020  1. Stable 2 mm nonobstructing right renal calculus. 2. Stable wall thickening at the anal verge, likely post therapeutic changes in this patient with history of anal cancer. 3. Stable trace left pleural effusion. 4.  Aortic Atherosclerosis  Last EGD: 2014 - duodenal ulcer treated with BiCap and epi, also presence of duodenal deformity, hiatal hernia Last Colonoscopy: 2019  The digital rectal exam revealed a 5 cm (diameter) hard rectal mass. The mass was non-circumferential and located predominantly at the anterior bowel wall. Biopsies were taken with a cold forceps for histology. The pathology specimen was placed into Bottle Number 1. A 6 mm polyp was found in the distal sigmoid colon. The polyp was semi-pedunculated. The polyp was removed with a hot snare. Resection and retrieval were complete. The pathology specimen was placed into Bottle Number 2. To prevent bleeding after the polypectomy, one hemostatic clip was successfully placed (MR conditional). There was no bleeding during, or at the end, of the procedure. The exam was otherwise normal throughout the examined colon.  Has not had a colonoscopy since she received Rtx, had 15 sessions.  Past Medical History: Past Medical History:  Diagnosis Date   Acute blood loss anemia    Atrial fibrillation ([place])    a. diagnosed in 12/2018. Rate-control pursued given not a candidate for anticoagulation   Chronic diarrhea    Dilation of biliary tract    Duodenal stricture    Esophageal stricture    Gastric ulcer    GI bleed    Hyperlipemia    Hypertension    Microscopic colitis    Nephrolithiasis  Pancreatic duct dilated    Schatzki's ring    Skin cancer    Sliding hiatal hernia    Vertigo     Past Surgical History: Past Surgical History:  Procedure Laterality Date   BIOPSY  07/28/2017   Procedure: BIOPSY;  Surgeon:  Rogene Houston, MD;  [facility];  Service: Endoscopy;;  rectum   COLONOSCOPY N/A 07/28/2017   Procedure: COLONOSCOPY;  Surgeon: Rogene Houston, MD;  [facility];  Service: Endoscopy;  Laterality: N/A;   ESOPHAGOGASTRODUODENOSCOPY N/A 02/27/2013   Procedure: ESOPHAGOGASTRODUODENOSCOPY (EGD);  Surgeon: Ladene Artist, MD;  Location[facility] END[facility]COPY;  Service: Endoscopy;  Laterality: N/A;   ESOPHAGOGASTRODUODENOSCOPY N/A 03/28/2013   Procedure: ESOPHAGOGASTRODUODENOSCOPY (EGD);  Surgeon: Rogene Houston, MD;  [facility];  Service: Endoscopy;  Laterality: N/A;   HOT HEMOSTASIS N/A 02/27/2013   Procedure: HOT HEMOSTASIS (ARGON PLASMA COAGULATION/BICAP);  Surgeon: Ladene Artist, MD;  Location[facility];  Service: Endoscopy;  Laterality: N/A;   NOSE SURGERY     for skin cancer   POLYPECTOMY  07/28/2017   Procedure: POLYPECTOMY;  Surgeon: Rogene Houston, MD;  [facility];  Service: Endoscopy;;  colon     Family History: Family History  Problem Relation Age of Onset   Heart attack Father     Social History: Social History   Tobacco Use  Smoking Status Never  Smokeless Tobacco Never   Social History   Substance and Sexual Activity  Alcohol Use No   Alcohol/week: 0.0 standard drinks   Social History   Substance and Sexual Activity  Drug Use No    Allergies: Allergies  Allergen Reactions   Clonazepam Other (See Comments)    Pruritis   Codeine Nausea And Vomiting and Other (See Comments)    Reaction unknown   Sulfa Antibiotics Other (See Comments)    Reactions unknown    Medications: Current Outpatient Medications  Medication Sig Dispense Refill   acetaminophen (TYLENOL) 500 MG tablet Take 1,000 mg by mouth every 6 (six) hours as needed.     ALPRAZolam (XANAX) 0.25 MG tablet Take 1 tablet (0.25 mg total) by mouth 2 (two) times daily as needed. for anxiety 8 tablet 0   ALPRAZolam (XANAX) 0.5 MG tablet Take 0.5 mg by  mouth at bedtime as needed.     diltiazem (CARDIZEM SR) 90 MG 12 hr capsule Take 1 capsule (90 mg total) by mouth 2 (two) times daily. 180 capsule 1   levothyroxine (SYNTHROID) 50 MCG tablet Take 50 mcg by mouth daily.     loperamide (IMODIUM A-D) 2 MG tablet Take 2 mg by mouth daily as needed for diarrhea or loose stools.     metoprolol succinate (TOPROL-XL) 25 MG 24 hr tablet Take 1 tablet (25 mg total) by mouth daily. 90 tablet 1   ondansetron (ZOFRAN) 4 MG tablet Take 1 tablet (4 mg total) by mouth every 8 (eight) hours as needed for nausea or vomiting. 20 tablet 0   potassium chloride SA (KLOR-CON) 20 MEQ tablet Take 1 tablet (20 mEq total) by mouth daily. 90 tablet 1   torsemide (DEMADEX) 20 MG tablet Take 2 tablets (40 mg total) by mouth every morning. 60 tablet 6   traMADol (ULTRAM) 50 MG tablet Take 50 mg by mouth every 6 (six) hours as needed.     pantoprazole (PROTONIX) 40 MG tablet Take 40 mg by mouth daily. (Patient not taking: Reported on 01/04/2021)  No current facility-administered medications for this visit.    Review of Systems: GENERAL: negative for malaise, night sweats HEENT: No changes in hearing or vision, no nose bleeds or other nasal problems. NECK: Negative for lumps, goiter, pain and significant neck swelling RESPIRATORY: Negative for cough, wheezing CARDIOVASCULAR: Negative for chest pain, leg swelling, palpitations, orthopnea GI: SEE HPI MUSCULOSKELETAL: Negative for joint pain or swelling, back pain, and muscle pain. SKIN: Negative for lesions, rash PSYCH: Negative for sleep disturbance, mood disorder and recent psychosocial stressors. HEMATOLOGY Negative for prolonged bleeding, bruising easily, and swollen nodes. ENDOCRINE: Negative for cold or heat intolerance, polyuria, polydipsia and goiter. NEURO: negative for tremor, gait imbalance, syncope and seizures. The remainder of the review of systems is noncontributory.   Physical Exam: BP 133/85 (BP  Location: Left Arm, Patient Position: Sitting, Cuff Size: Small)   Pulse 68   Temp (!) 97.4 F (36.3 C) (Oral)   Ht 5\' 6"  (1.676 m)   Wt 115 lb 4.8 oz (52.3 kg)   BMI 18.61 kg/m  GENERAL: The patient is AO x3, in no acute distress. HEENT: Head is normocephalic and atraumatic. EOMI are intact. Mouth is well hydrated and without lesions. NECK: Supple. No masses LUNGS: Clear to auscultation. No presence of rhonchi/wheezing/rales. Adequate chest expansion HEART: RRR, normal s1 and s2. ABDOMEN: Soft, nontender, no guarding, no peritoneal signs, and nondistended. BS +. No masses. EXTREMITIES: Without any cyanosis, clubbing, rash, lesions or edema. NEUROLOGIC: AOx3, no focal motor deficit. SKIN: no jaundice, no rashes  Imaging/Labs: as above  I personally reviewed and interpreted the available labs, imaging and endoscopic files.  Impression and Plan: History of Present Illness: Renee Blake is a 87 y.o. female with past medical history of atrial fibrillation, esophageal stricture, hypertension, hyperlipidemia, ?  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  The patient has presented persistently nauseated without a clear cause but without presence of red flag signs.  She underwent cross-sectional abdominal imaging relatively recently without evidence of any major alterations.  She has responded to Zofran and has been able to tolerate food intake.  It is unclear to me why she has presented these symptoms.  We will need to evaluate this further with an EGD but given her previous comorbidities she will need to get clearance by cardiology.  She understood and will set up an appointment with them to obtain clearance.  In terms of her history of squamous cell carcinoma and previous rectal bleeding, her symptoms have much more  improved and are very rare.  Given her age and poor surgical candidacy, we agreed that we will only perform a flexible sigmoidoscopy if she were to become more symptomatic as performing endoscopic procedure at this moment will not change her current management.  - Continue Zofran as needed for nausea - Discuss with cardiologist clearance to undergo EGD -Patient to call back if recurrent rectal bleeding or worsening consitpation, may need a flexible sigmoidoscopy  All questions were answered.      Harvel Quale, MD Gastroenterology and Hepatology [facility]

## 2021-01-04 NOTE — Patient Instructions (Signed)
Continue Zofran as needed for nausea Discuss with cardiologist clearance to undergo EGD Please call back if recurrent rectal bleeding or worsening consitpation, may need a flexible sigmoidoscopy

## 2021-01-06 DIAGNOSIS — I5040 Unspecified combined systolic (congestive) and diastolic (congestive) heart failure: Secondary | ICD-10-CM | POA: Diagnosis not present

## 2021-01-09 DIAGNOSIS — R600 Localized edema: Secondary | ICD-10-CM | POA: Diagnosis not present

## 2021-01-09 DIAGNOSIS — D509 Iron deficiency anemia, unspecified: Secondary | ICD-10-CM | POA: Diagnosis not present

## 2021-01-09 DIAGNOSIS — E039 Hypothyroidism, unspecified: Secondary | ICD-10-CM | POA: Diagnosis not present

## 2021-01-09 DIAGNOSIS — W19XXXA Unspecified fall, initial encounter: Secondary | ICD-10-CM | POA: Diagnosis not present

## 2021-01-09 DIAGNOSIS — I4891 Unspecified atrial fibrillation: Secondary | ICD-10-CM | POA: Diagnosis not present

## 2021-01-09 DIAGNOSIS — N1832 Chronic kidney disease, stage 3b: Secondary | ICD-10-CM | POA: Diagnosis not present

## 2021-01-09 DIAGNOSIS — I10 Essential (primary) hypertension: Secondary | ICD-10-CM | POA: Diagnosis not present

## 2021-01-09 DIAGNOSIS — I509 Heart failure, unspecified: Secondary | ICD-10-CM | POA: Diagnosis not present

## 2021-01-29 DIAGNOSIS — J9621 Acute and chronic respiratory failure with hypoxia: Secondary | ICD-10-CM | POA: Diagnosis not present

## 2021-02-06 DIAGNOSIS — I5040 Unspecified combined systolic (congestive) and diastolic (congestive) heart failure: Secondary | ICD-10-CM | POA: Diagnosis not present

## 2021-02-12 NOTE — Progress Notes (Signed)
This encounter was created in error - please disregard.

## 2021-02-13 ENCOUNTER — Encounter: Payer: Medicare Other | Admitting: Family Medicine

## 2021-02-13 ENCOUNTER — Ambulatory Visit: Payer: Medicare Other | Admitting: Family Medicine

## 2021-02-13 DIAGNOSIS — I10 Essential (primary) hypertension: Secondary | ICD-10-CM

## 2021-02-13 DIAGNOSIS — N183 Chronic kidney disease, stage 3 unspecified: Secondary | ICD-10-CM

## 2021-02-13 DIAGNOSIS — I4819 Other persistent atrial fibrillation: Secondary | ICD-10-CM

## 2021-02-13 DIAGNOSIS — I340 Nonrheumatic mitral (valve) insufficiency: Secondary | ICD-10-CM

## 2021-02-13 DIAGNOSIS — D649 Anemia, unspecified: Secondary | ICD-10-CM

## 2021-02-13 DIAGNOSIS — I5032 Chronic diastolic (congestive) heart failure: Secondary | ICD-10-CM

## 2021-02-28 DIAGNOSIS — J9621 Acute and chronic respiratory failure with hypoxia: Secondary | ICD-10-CM | POA: Diagnosis not present

## 2021-03-01 ENCOUNTER — Ambulatory Visit: Payer: Medicare Other | Admitting: Family Medicine

## 2021-03-02 ENCOUNTER — Ambulatory Visit: Payer: Medicare Other | Admitting: Urology

## 2021-03-02 DIAGNOSIS — N390 Urinary tract infection, site not specified: Secondary | ICD-10-CM

## 2021-03-02 DIAGNOSIS — R339 Retention of urine, unspecified: Secondary | ICD-10-CM

## 2021-03-04 NOTE — Progress Notes (Signed)
Cardiology Office Note:    Date:  03/08/2021   ID:  Renee Blake, DOB 09-14-1928, MRN [id number]  PCP:  [name], MD  Cardiologist:  None  Electrophysiologist:  None   Referring MD: [name], MD   Chief Complaint  Patient presents with   Congestive Heart Failure    History of Present Illness:    Renee Blake is a 87 y.o. female with a hx of chronic diastolic heart failure, CKD stage III, atrial fibrillation, hypertension, hyperlipidemia, GI bleeding who presents for follow-up.  She was admitted in April 2022 with decompensated heart failure.  Diuresed with IV Lasix and was discharged on torsemide 60 mg every morning/30 mg nightly.  She was rate controlled on Cardizem and metoprolol for [name], but has not been in anticoagulation due to history of GI bleeding.  At follow-up visit in June 2022, her torsemide dose was decreased to 40 mg daily  Echocardiogram 07/26/2020 showed EF 65 to 70%, normal RV function, mild to moderate MR, mild to moderate TR.  Since last clinic visit, she reports has been doing well.  She did have to stop taking potassium supplement, as it was causing significant nausea.  Reports has been weighing herself daily and has been stable at 116 to 120 pounds.  Denies any chest pain, dyspnea, lightheadedness, syncope, lower extremity edema, or palpitations.   Wt Readings from Last 3 Encounters:  03/08/21 116 lb 3.2 oz (52.7 kg)  01/04/21 115 lb 4.8 oz (52.3 kg)  11/29/20 121 lb 12.8 oz (55.2 kg)     Past Medical History:  Diagnosis Date   Acute blood loss anemia    Atrial fibrillation ([place])    a. diagnosed in 12/2018. Rate-control pursued given not a candidate for anticoagulation   Chronic diarrhea    Dilation of biliary tract    Duodenal stricture    Esophageal stricture    Gastric ulcer    GI bleed    Hyperlipemia    Hypertension    Microscopic colitis    Nephrolithiasis    Pancreatic duct dilated    Schatzki's ring    Skin cancer     Sliding hiatal hernia    Vertigo     Past Surgical History:  Procedure Laterality Date   BIOPSY  07/28/2017   Procedure: BIOPSY;  Surgeon: Rogene Houston, MD;  [facility];  Service: Endoscopy;;  rectum   COLONOSCOPY N/A 07/28/2017   Procedure: COLONOSCOPY;  Surgeon: Rogene Houston, MD;  [facility];  Service: Endoscopy;  Laterality: N/A;   ESOPHAGOGASTRODUODENOSCOPY N/A 02/27/2013   Procedure: ESOPHAGOGASTRODUODENOSCOPY (EGD);  Surgeon: Ladene Artist, MD;  Location: [facility] ENDOSCOPY;  Service: Endoscopy;  Laterality: N/A;   ESOPHAGOGASTRODUODENOSCOPY N/A 03/28/2013   Procedure: ESOPHAGOGASTRODUODENOSCOPY (EGD);  Surgeon: Rogene Houston, MD;  [facility];  Service: Endoscopy;  Laterality: N/A;   HOT HEMOSTASIS N/A 02/27/2013   Procedure: HOT HEMOSTASIS (ARGON PLASMA COAGULATION/BICAP);  Surgeon: Ladene Artist, MD;  Location: [facility] ENDOSCOPY;  Service: Endoscopy;  Laterality: N/A;   NOSE SURGERY     for skin cancer   POLYPECTOMY  07/28/2017   Procedure: POLYPECTOMY;  Surgeon: Rogene Houston, MD;  [facility];  Service: Endoscopy;;  colon     Current Medications: Current Meds  Medication Sig   acetaminophen (TYLENOL) 500 MG tablet Take 1,000 mg by mouth every 6 (six) hours as needed.   ALPRAZolam (XANAX) 0.25 MG tablet Take 1  tablet (0.25 mg total) by mouth 2 (two) times daily as needed. for anxiety   ALPRAZolam (XANAX) 0.5 MG tablet Take 0.5 mg by mouth at bedtime as needed.   diltiazem (CARDIZEM SR) 90 MG 12 hr capsule Take 1 capsule (90 mg total) by mouth 2 (two) times daily.   levothyroxine (SYNTHROID) 50 MCG tablet Take 50 mcg by mouth daily.   loperamide (IMODIUM A-D) 2 MG tablet Take 2 mg by mouth daily as needed for diarrhea or loose stools.   metoprolol succinate (TOPROL-XL) 25 MG 24 hr tablet Take 1 tablet (25 mg total) by mouth daily.   ondansetron (ZOFRAN) 4 MG tablet Take 1 tablet (4 mg total) by mouth every 8 (eight)  hours as needed for nausea or vomiting.   pantoprazole (PROTONIX) 40 MG tablet Take 40 mg by mouth daily.   potassium chloride SA (KLOR-CON) 20 MEQ tablet Take 1 tablet (20 mEq total) by mouth daily.   torsemide (DEMADEX) 20 MG tablet Take 2 tablets (40 mg total) by mouth every morning.   traMADol (ULTRAM) 50 MG tablet Take 50 mg by mouth every 6 (six) hours as needed.     Allergies:   Clonazepam, Codeine, and Sulfa antibiotics   Social History   Socioeconomic History   Marital status: Widowed    Spouse name: Not on file   Number of children: Not on file   Years of education: Not on file   Highest education level: Not on file  Occupational History   Not on file  Tobacco Use   Smoking status: Never   Smokeless tobacco: Never  Vaping Use   Vaping Use: Never used  Substance and Sexual Activity   Alcohol use: No    Alcohol/week: 0.0 standard drinks   Drug use: No   Sexual activity: Not on file  Other Topics Concern   Not on file  Social History Narrative   Not on file   Social Determinants of Health   Financial Resource Strain: Not on file  Food Insecurity: Not on file  Transportation Needs: Not on file  Physical Activity: Not on file  Stress: Not on file  Social Connections: Not on file     Family History: The patient's family history includes Heart attack in her father.  ROS:   Please see the history of present illness.     All other systems reviewed and are negative.  EKGs/Labs/Other Studies Reviewed:    The following studies were reviewed today:   EKG:  EKG is not ordered today.   Recent Labs: 07/26/2020: Magnesium 2.0; TSH 0.372 09/13/2020: B Natriuretic Peptide 246.0 11/29/2020: ALT 15; BUN 24; Creatinine, Ser 1.85; Hemoglobin 10.3; Platelets 225; Potassium 4.2; Sodium 139  Recent Lipid Panel No results found for: CHOL, TRIG, HDL, CHOLHDL, VLDL, LDLCALC, LDLDIRECT  Physical Exam:    VS:  BP 110/60   Pulse 95   Ht 5\' 6"  (1.676 m)   Wt 116 lb 3.2 oz  (52.7 kg)   SpO2 100%   BMI 18.76 kg/m     Wt Readings from Last 3 Encounters:  03/08/21 116 lb 3.2 oz (52.7 kg)  01/04/21 115 lb 4.8 oz (52.3 kg)  11/29/20 121 lb 12.8 oz (55.2 kg)     GEN:  in no acute distress HEENT: Normal NECK: No JVD; No carotid bruits CARDIAC: Irregular, normal rate, no murmurs, rubs, gallops RESPIRATORY:  Clear to auscultation without rales, wheezing or rhonchi  ABDOMEN: Soft, non-tender, non-distended MUSCULOSKELETAL:  No edema; No deformity  SKIN: Warm and dry NEUROLOGIC:  Alert and oriented x 3 PSYCHIATRIC:  Normal affect   ASSESSMENT:    1. Chronic diastolic heart failure ([place])   2. Essential hypertension   3. Persistent atrial fibrillation (HCC)   4. Stage 3 chronic kidney disease, unspecified whether stage 3a or 3b CKD (H[facility])    PLAN:    Atrial fibrillation: Appears chronic.  Has not been on anticoagulation due to GI bleeding history -Continue diltiazem 90 mg twice daily and Toprol-XL 25 mg daily -Daughter reports that she has been unable to tolerate even aspirin due to GI bleeding.  Will continue to hold off on anticoagulation  Chronic diastolic heart failure: On torsemide 40 mg daily.  Echocardiogram 07/26/2020 showed EF 65 to 70%, normal RV function, mild to moderate MR, mild to moderate TR. -Appears euvolemic.  She stopped taking potassium supplement due to nausea.  Will check BMP/magnesium  Hypertension: On Toprol-XL, diltiazem, and torsemide.  Appears controlled  CKD stage III: Will check BMP as above   RTC in 6 months   Medication Adjustments/Labs and Tests Ordered: Current medicines are reviewed at length with the patient today.  Concerns regarding medicines are outlined above.  Orders Placed This Encounter  Procedures   Basic metabolic panel   Magnesium   No orders of the defined types were placed in this encounter.   Patient Instructions  Medication Instructions:  Your physician recommends that you continue on your  current medications as directed. Please refer to the Current Medication list given to you today.  *If you need a refill on your cardiac medications before your next appointment, please call your pharmacy*   Lab Work: BMET MAG If you have labs (blood work) drawn today and your tests are completely normal, you will receive your results only by: [place] (if you have MyChart) OR A paper copy in the mail If you have any lab test that is abnormal or we need to change your treatment, we will call you to review the results.   Testing/Procedures: None   Follow-Up: At [facility], you and your health needs are our priority.  As part of our continuing mission to provide you with exceptional heart care, we have created designated Provider Care Teams.  These Care Teams include your primary Cardiologist (physician) and Advanced Practice Providers (APPs -  Physician Assistants and Nurse Practitioners) who all work together to provide you with the care you need, when you need it.  We recommend signing up for the patient portal called "MyChart".  Sign up information is provided on this After Visit Summary.  MyChart is used to connect with patients for Virtual Visits (Telemedicine).  Patients are able to view lab/test results, encounter notes, upcoming appointments, etc.  Non-urgent messages can be sent to your provider as well.   To learn more about what you can do with MyChart, go to [email or web address].    Your next appointment:   6 month(s)  The format for your next appointment:   In Person  Provider:   Oswaldo Milian, MD     Other Instructions     Signed, Donato Heinz, MD  03/08/2021 10:00 AM    [place]

## 2021-03-08 ENCOUNTER — Other Ambulatory Visit (HOSPITAL_COMMUNITY)
Admission: RE | Admit: 2021-03-08 | Discharge: 2021-03-08 | Disposition: A | Discharge status: Home / Self Care | Payer: Medicare Other | Source: Ambulatory Visit | Attending: Cardiology | Admitting: Cardiology

## 2021-03-08 ENCOUNTER — Other Ambulatory Visit: Payer: Self-pay

## 2021-03-08 ENCOUNTER — Ambulatory Visit (INDEPENDENT_AMBULATORY_CARE_PROVIDER_SITE_OTHER): Payer: Medicare Other | Admitting: Cardiology

## 2021-03-08 ENCOUNTER — Encounter: Payer: Self-pay | Admitting: Cardiology

## 2021-03-08 VITALS — BP 110/60 | HR 95 | Ht 66.0 in | Wt 116.2 lb

## 2021-03-08 DIAGNOSIS — I5032 Chronic diastolic (congestive) heart failure: Secondary | ICD-10-CM

## 2021-03-08 DIAGNOSIS — I4819 Other persistent atrial fibrillation: Secondary | ICD-10-CM | POA: Diagnosis not present

## 2021-03-08 DIAGNOSIS — I10 Essential (primary) hypertension: Secondary | ICD-10-CM | POA: Diagnosis not present

## 2021-03-08 DIAGNOSIS — N183 Chronic kidney disease, stage 3 unspecified: Secondary | ICD-10-CM | POA: Diagnosis not present

## 2021-03-08 LAB — BASIC METABOLIC PANEL
Anion gap: 10 (ref 5–15)
BUN: 14 mg/dL (ref 8–23)
CO2: 26 mmol/L (ref 22–32)
Calcium: 9.2 mg/dL (ref 8.9–10.3)
Chloride: 105 mmol/L (ref 98–111)
Creatinine, Ser: 0.96 mg/dL (ref 0.44–1.00)
GFR, Estimated: 56 mL/min — ABNORMAL LOW (ref >60)
Glucose, Bld: 101 mg/dL — ABNORMAL HIGH (ref 70–99)
Potassium: 3.4 mmol/L — ABNORMAL LOW (ref 3.5–5.1)
Sodium: 141 mmol/L (ref 135–145)

## 2021-03-08 LAB — MAGNESIUM: Magnesium: 2 mg/dL (ref 1.7–2.4)

## 2021-03-08 NOTE — Patient Instructions (Signed)
Medication Instructions:  Your physician recommends that you continue on your current medications as directed. Please refer to the Current Medication list given to you today.  *If you need a refill on your cardiac medications before your next appointment, please call your pharmacy*   Lab Work: BMET MAG If you have labs (blood work) drawn today and your tests are completely normal, you will receive your results only by: [place] (if you have MyChart) OR A paper copy in the mail If you have any lab test that is abnormal or we need to change your treatment, we will call you to review the results.   Testing/Procedures: None   Follow-Up: At [facility], you and your health needs are our priority.  As part of our continuing mission to provide you with exceptional heart care, we have created designated Provider Care Teams.  These Care Teams include your primary Cardiologist (physician) and Advanced Practice Providers (APPs -  Physician Assistants and Nurse Practitioners) who all work together to provide you with the care you need, when you need it.  We recommend signing up for the patient portal called "MyChart".  Sign up information is provided on this After Visit Summary.  MyChart is used to connect with patients for Virtual Visits (Telemedicine).  Patients are able to view lab/test results, encounter notes, upcoming appointments, etc.  Non-urgent messages can be sent to your provider as well.   To learn more about what you can do with MyChart, go to [email or web address].    Your next appointment:   6 month(s)  The format for your next appointment:   In Person  Provider:   Oswaldo Milian, MD     Other Instructions

## 2021-03-28 ENCOUNTER — Other Ambulatory Visit: Payer: Self-pay | Admitting: *Deleted

## 2021-03-28 MED ORDER — TORSEMIDE 20 MG PO TABS: 40.0000 mg | ORAL_TABLET | Freq: Every morning | ORAL | 6 refills | Status: DC

## 2021-03-30 DIAGNOSIS — I10 Essential (primary) hypertension: Secondary | ICD-10-CM | POA: Diagnosis not present

## 2021-03-31 DIAGNOSIS — J9621 Acute and chronic respiratory failure with hypoxia: Secondary | ICD-10-CM | POA: Diagnosis not present

## 2021-04-04 DIAGNOSIS — I10 Essential (primary) hypertension: Secondary | ICD-10-CM | POA: Diagnosis not present

## 2021-04-04 DIAGNOSIS — N1832 Chronic kidney disease, stage 3b: Secondary | ICD-10-CM | POA: Diagnosis not present

## 2021-04-04 DIAGNOSIS — E611 Iron deficiency: Secondary | ICD-10-CM | POA: Diagnosis not present

## 2021-04-04 DIAGNOSIS — E860 Dehydration: Secondary | ICD-10-CM | POA: Diagnosis not present

## 2021-04-04 DIAGNOSIS — E039 Hypothyroidism, unspecified: Secondary | ICD-10-CM | POA: Diagnosis not present

## 2021-04-04 DIAGNOSIS — R197 Diarrhea, unspecified: Secondary | ICD-10-CM | POA: Diagnosis not present

## 2021-04-04 DIAGNOSIS — Z8679 Personal history of other diseases of the circulatory system: Secondary | ICD-10-CM | POA: Diagnosis not present

## 2021-04-04 DIAGNOSIS — E785 Hyperlipidemia, unspecified: Secondary | ICD-10-CM | POA: Diagnosis not present

## 2021-04-04 DIAGNOSIS — R809 Proteinuria, unspecified: Secondary | ICD-10-CM | POA: Diagnosis not present

## 2021-04-04 DIAGNOSIS — I509 Heart failure, unspecified: Secondary | ICD-10-CM | POA: Diagnosis not present

## 2021-04-06 ENCOUNTER — Emergency Department (HOSPITAL_COMMUNITY)
Admission: EM | Admit: 2021-04-06 | Discharge: 2021-04-06 | Disposition: A | Discharge status: Home / Self Care | Payer: Medicare Other | Attending: Emergency Medicine | Admitting: Emergency Medicine

## 2021-04-06 ENCOUNTER — Encounter (HOSPITAL_COMMUNITY): Payer: Self-pay | Admitting: *Deleted

## 2021-04-06 ENCOUNTER — Emergency Department (HOSPITAL_COMMUNITY): Payer: Medicare Other

## 2021-04-06 DIAGNOSIS — R197 Diarrhea, unspecified: Secondary | ICD-10-CM

## 2021-04-06 DIAGNOSIS — R5383 Other fatigue: Secondary | ICD-10-CM | POA: Diagnosis not present

## 2021-04-06 DIAGNOSIS — N281 Cyst of kidney, acquired: Secondary | ICD-10-CM | POA: Diagnosis not present

## 2021-04-06 DIAGNOSIS — N9489 Other specified conditions associated with female genital organs and menstrual cycle: Secondary | ICD-10-CM

## 2021-04-06 DIAGNOSIS — K828 Other specified diseases of gallbladder: Secondary | ICD-10-CM | POA: Diagnosis not present

## 2021-04-06 DIAGNOSIS — Z79899 Other long term (current) drug therapy: Secondary | ICD-10-CM | POA: Insufficient documentation

## 2021-04-06 DIAGNOSIS — I130 Hypertensive heart and chronic kidney disease with heart failure and stage 1 through stage 4 chronic kidney disease, or unspecified chronic kidney disease: Secondary | ICD-10-CM | POA: Diagnosis not present

## 2021-04-06 DIAGNOSIS — I509 Heart failure, unspecified: Secondary | ICD-10-CM | POA: Diagnosis not present

## 2021-04-06 DIAGNOSIS — N1832 Chronic kidney disease, stage 3b: Secondary | ICD-10-CM | POA: Diagnosis not present

## 2021-04-06 DIAGNOSIS — K838 Other specified diseases of biliary tract: Secondary | ICD-10-CM | POA: Diagnosis not present

## 2021-04-06 DIAGNOSIS — I517 Cardiomegaly: Secondary | ICD-10-CM | POA: Diagnosis not present

## 2021-04-06 DIAGNOSIS — R110 Nausea: Secondary | ICD-10-CM | POA: Diagnosis not present

## 2021-04-06 DIAGNOSIS — Z20822 Contact with and (suspected) exposure to covid-19: Secondary | ICD-10-CM | POA: Insufficient documentation

## 2021-04-06 DIAGNOSIS — K6389 Other specified diseases of intestine: Secondary | ICD-10-CM | POA: Diagnosis not present

## 2021-04-06 DIAGNOSIS — K5989 Other specified functional intestinal disorders: Secondary | ICD-10-CM | POA: Diagnosis not present

## 2021-04-06 DIAGNOSIS — Z85040 Personal history of malignant carcinoid tumor of rectum: Secondary | ICD-10-CM | POA: Insufficient documentation

## 2021-04-06 DIAGNOSIS — R42 Dizziness and giddiness: Secondary | ICD-10-CM | POA: Insufficient documentation

## 2021-04-06 DIAGNOSIS — R0902 Hypoxemia: Secondary | ICD-10-CM | POA: Diagnosis not present

## 2021-04-06 DIAGNOSIS — R531 Weakness: Secondary | ICD-10-CM | POA: Diagnosis not present

## 2021-04-06 DIAGNOSIS — N200 Calculus of kidney: Secondary | ICD-10-CM | POA: Diagnosis not present

## 2021-04-06 LAB — BASIC METABOLIC PANEL
Anion gap: 12 (ref 5–15)
BUN: 22 mg/dL (ref 8–23)
CO2: 20 mmol/L — ABNORMAL LOW (ref 22–32)
Calcium: 9 mg/dL (ref 8.9–10.3)
Chloride: 104 mmol/L (ref 98–111)
Creatinine, Ser: 1.37 mg/dL — ABNORMAL HIGH (ref 0.44–1.00)
GFR, Estimated: 37 mL/min — ABNORMAL LOW (ref >60)
Glucose, Bld: 85 mg/dL (ref 70–99)
Potassium: 4.2 mmol/L (ref 3.5–5.1)
Sodium: 136 mmol/L (ref 135–145)

## 2021-04-06 LAB — CBC WITH DIFFERENTIAL/PLATELET
Abs Immature Granulocytes: 0.01 10*3/uL (ref 0.00–0.07)
Basophils Absolute: 0 10*3/uL (ref 0.0–0.1)
Basophils Relative: 0 %
Eosinophils Absolute: 0.2 10*3/uL (ref 0.0–0.5)
Eosinophils Relative: 3 %
HCT: 34.1 % — ABNORMAL LOW (ref 36.0–46.0)
Hemoglobin: 11.5 g/dL — ABNORMAL LOW (ref 12.0–15.0)
Immature Granulocytes: 0 %
Lymphocytes Relative: 34 %
Lymphs Abs: 2 10*3/uL (ref 0.7–4.0)
MCH: 30.9 pg (ref 26.0–34.0)
MCHC: 33.7 g/dL (ref 30.0–36.0)
MCV: 91.7 fL (ref 80.0–100.0)
Monocytes Absolute: 0.9 10*3/uL (ref 0.1–1.0)
Monocytes Relative: 15 %
Neutro Abs: 2.7 10*3/uL (ref 1.7–7.7)
Neutrophils Relative %: 48 %
Platelets: 320 10*3/uL (ref 150–400)
RBC: 3.72 MIL/uL — ABNORMAL LOW (ref 3.87–5.11)
RDW: 15 % (ref 11.5–15.5)
WBC: 5.7 10*3/uL (ref 4.0–10.5)
nRBC: 0 % (ref 0.0–0.2)

## 2021-04-06 LAB — RESP PANEL BY RT-PCR (FLU A&B, COVID) ARPGX2
Influenza A by PCR: NEGATIVE
Influenza B by PCR: NEGATIVE
SARS Coronavirus 2 by RT PCR: NEGATIVE

## 2021-04-06 LAB — POC OCCULT BLOOD, ED: Occult Blood, Stool #1: POSITIVE

## 2021-04-06 LAB — BRAIN NATRIURETIC PEPTIDE: B Natriuretic Peptide: 198 pg/mL — ABNORMAL HIGH (ref 0.0–100.0)

## 2021-04-06 LAB — MAGNESIUM: Magnesium: 1.7 mg/dL (ref 1.7–2.4)

## 2021-04-06 IMAGING — CT CT ABD-PELV W/ CM
2 of 5 series · 15 of 46 positions shown, 17 images · IV contrast (Omnipaque or Isovue)
Comparison: Chest x-ray [DATE], CT [DATE], [DATE],
[DATE]

CLINICAL DATA: Lower GI bleed nausea

EXAM:
CT ABDOMEN AND PELVIS WITH CONTRAST
TECHNIQUE: Multidetector CT imaging of the abdomen and pelvis was performed
using the standard protocol following bolus administration of
intravenous contrast.
CONTRAST:  75mL OMNIPAQUE IOHEXOL 300 MG/ML  SOLN

[Series 4: axial st · axial · 0.79mm/px · z∈[+838,+1238]mm · 12 of 91 slices shown, 14 images]
[im 6/91  soft-tissue]
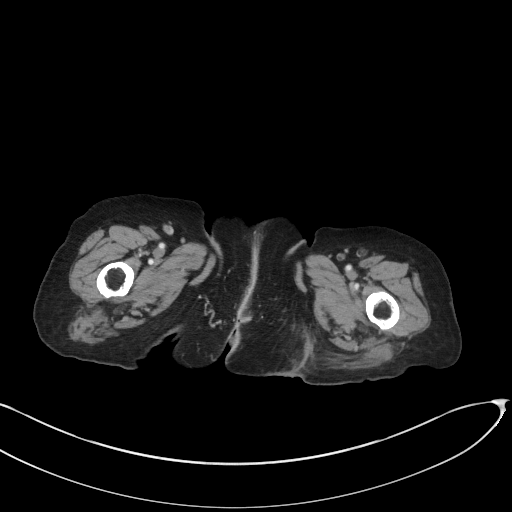
[im 6/91  bone]
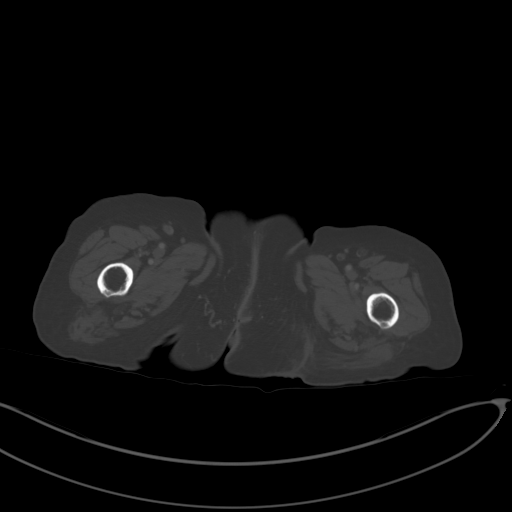
[im 16/91  soft-tissue]
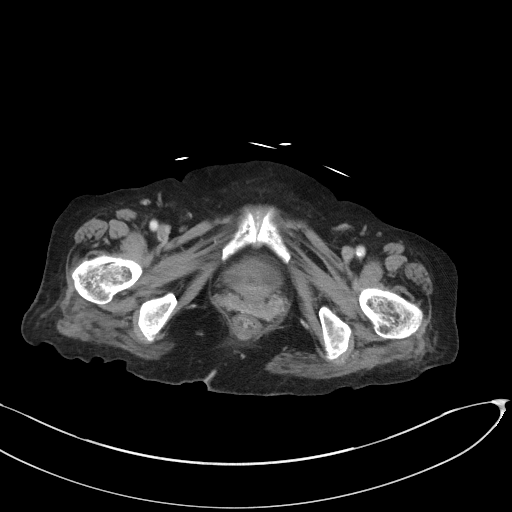
[im 21/91  soft-tissue]
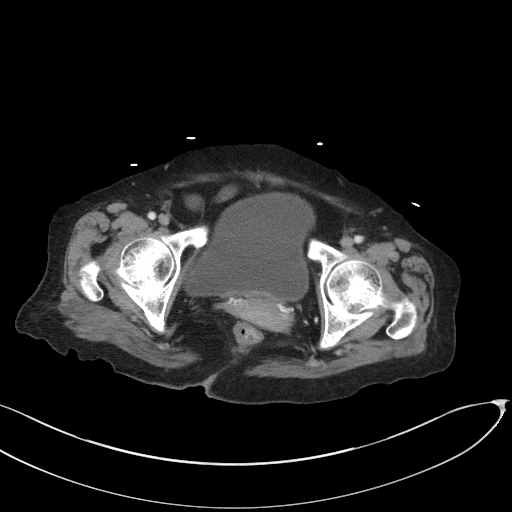
[im 26/91  soft-tissue]
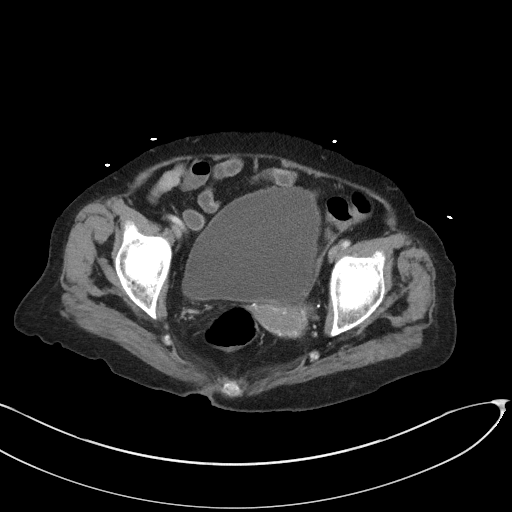
[im 36/91  soft-tissue]
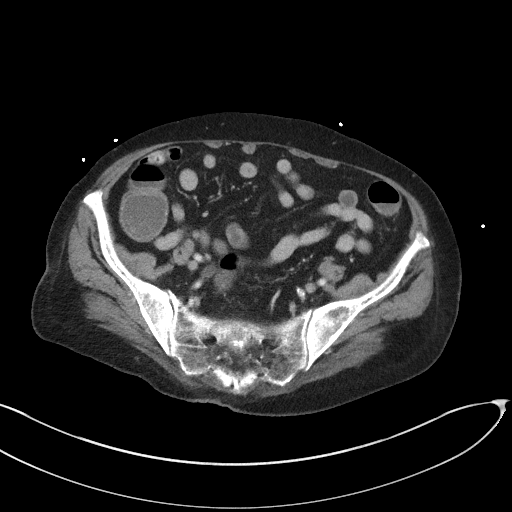
[im 41/91  soft-tissue]
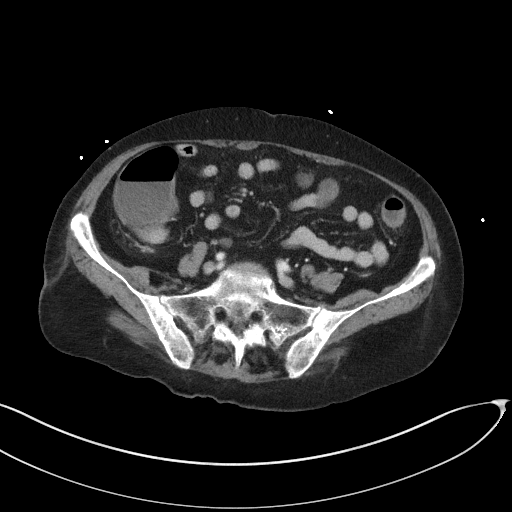
[im 51/91  soft-tissue]
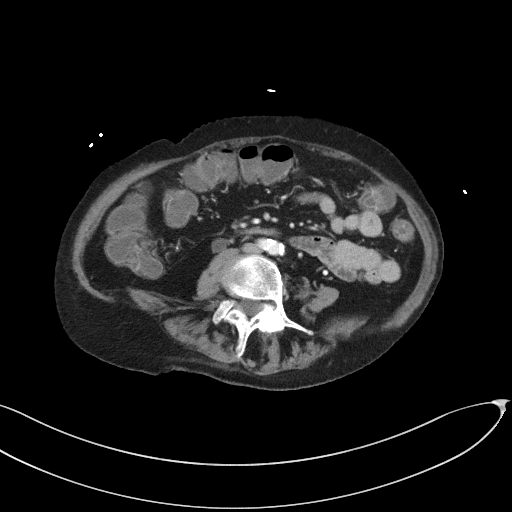
[im 56/91  soft-tissue]
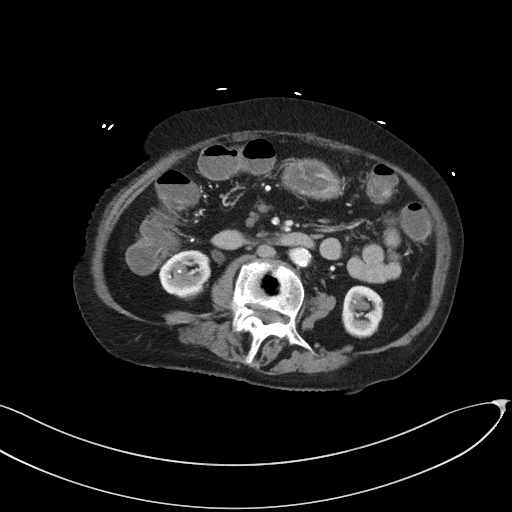
[im 66/91  soft-tissue]
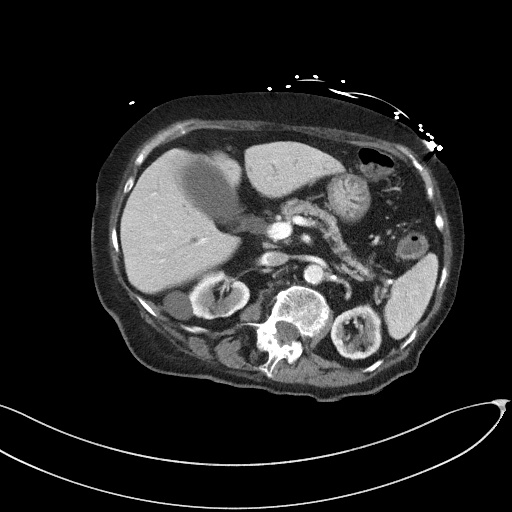
[im 66/91  bone]
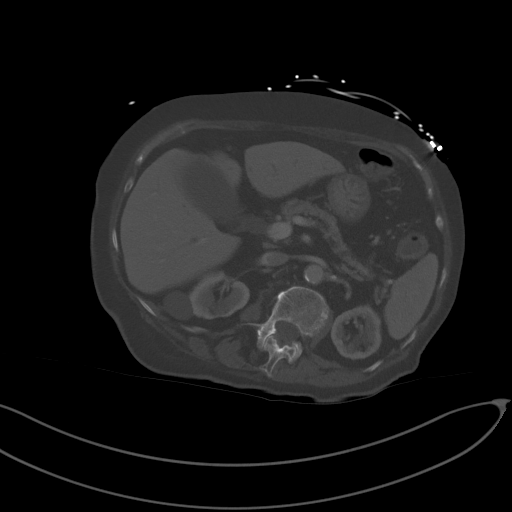
[im 71/91  soft-tissue]
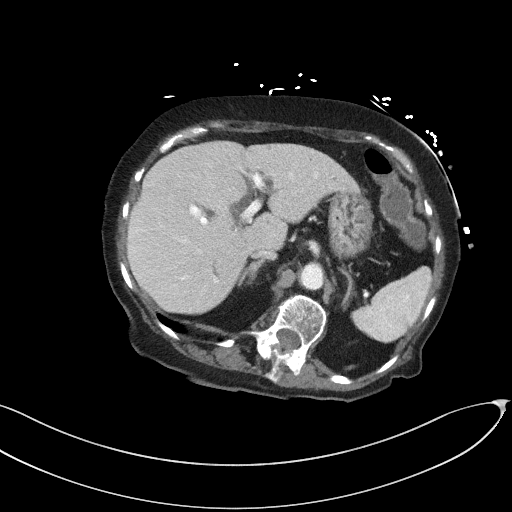
[im 76/91  soft-tissue]
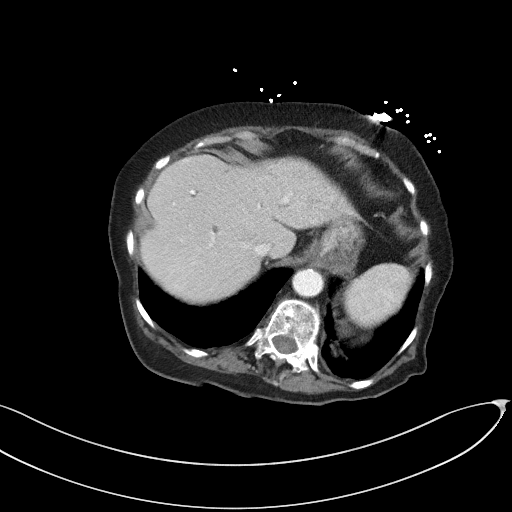
[im 86/91  soft-tissue]
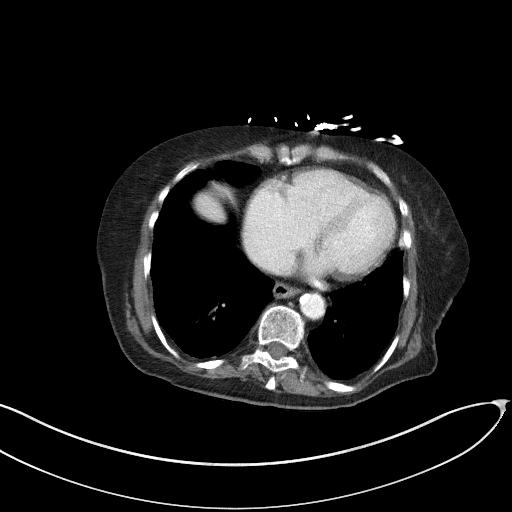

[Series 6: coronal st · coronal · 0.75mm/px · 3 of 89 slices shown]
[im 30/89  soft-tissue]
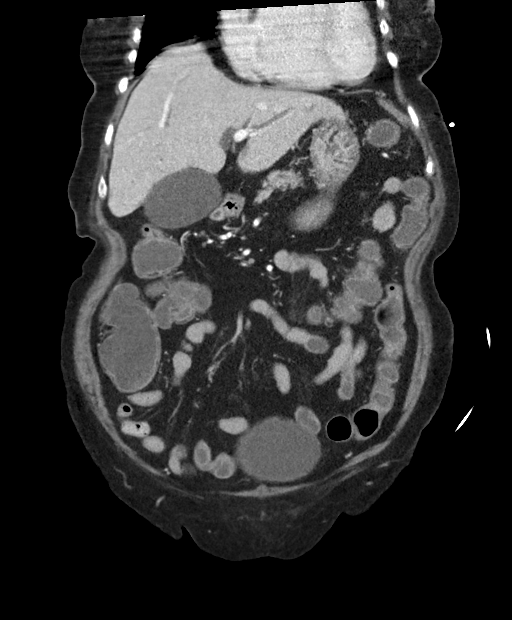
[im 40/89  soft-tissue]
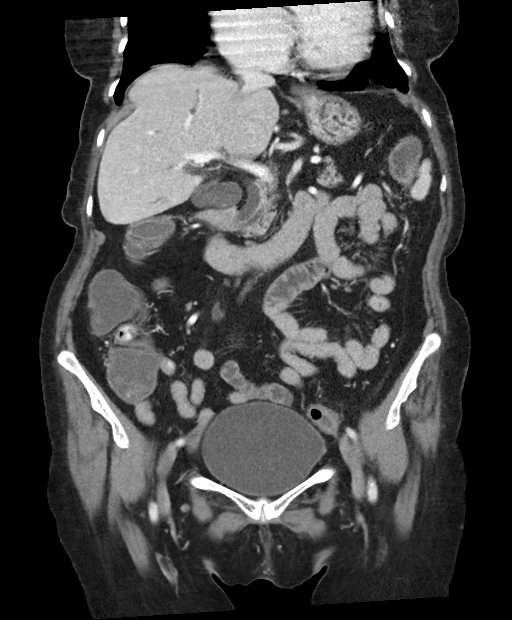
[im 49/89  soft-tissue]
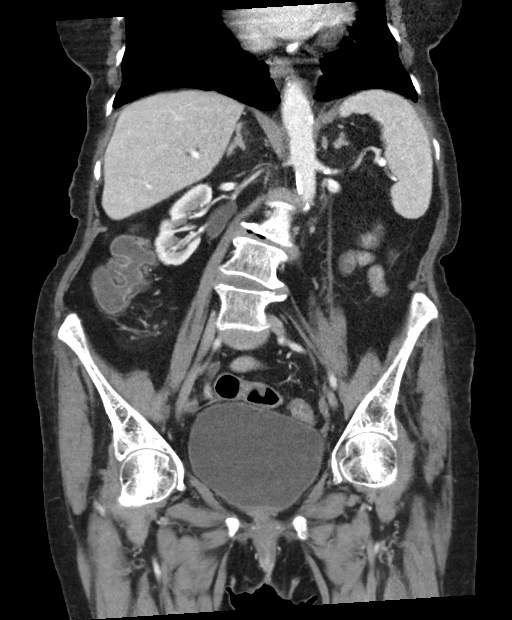

[15 of 46 positions shown; findings below may reference images not displayed]

FINDINGS: Lower chest: Lung bases demonstrate no acute consolidation or
effusion. Mild cardiomegaly.

Hepatobiliary: Dilated gallbladder without calcified stone or
inflammation. Mild intra and extrahepatic biliary dilatation, stable
to slightly increased, the common bile duct measures 10 mm.

Pancreas: Atrophic. No inflammatory change. Mild prominence of
pancreatic duct.

Spleen: Normal in size without focal abnormality.

Adrenals/Urinary Tract: Adrenal glands are normal. Cyst in the right
kidney. Kidneys are atrophic. Small nonobstructing stone lower pole
right kidney. Mild hydronephrosis of extrarenal pelvis and
hydroureter on the right without visible obstructing stone. This is
a chronic finding. Moderate bladder distension

Stomach/Bowel: The stomach is nonenlarged. No dilated small bowel.
Diffuse fluid within the colon suggestive of diarrheal illness.
Diffuse mucosal enhancement of the colon without extravasation. No
colon wall thickening.

Vascular/Lymphatic: Moderate aortic atherosclerosis.  No aneurysm.

Reproductive: Uterus unremarkable. Small complex septated/cystic
lesions are seen in the right pelvis, the smaller anterior lesion
measures 20 mm and the more posterior lesion measures 23 mm, series
4, image 62 and series 4, image 60.

Other: Negative for pelvic effusion or free air.

Musculoskeletal: Scoliosis and degenerative changes of the spine. No
acute osseous abnormality.
IMPRESSION: 1. Diffuse fluid within the colon suggesting diarrhea. There is mild
diffuse mucosal enhancement/hyperemia of the colon but no acute wall
thickening or obvious extravasation.
2. Dilated gallbladder without calcified stone. Mild intra and
extrahepatic biliary dilatation which is stable to slightly
increased compared to previous exams.
3. Right pelvic complex septated/cystic lesions measuring up to 23
mm, raising concern for adnexal neoplasm versus abnormal necrotic
pelvic nodes. Correlation with nonemergent pelvic ultrasound may be
performed if deemed clinically appropriate.
4. Atrophic kidneys. Chronic extrarenal pelvis in ureterectasis on
the right. Small nonobstructing stone lower pole right kidney.

## 2021-04-06 IMAGING — DX DG CHEST 1V PORT
1 series · 1 of 1 positions shown · non-contrast
Comparison: Chest radiograph dated [DATE]

CLINICAL DATA: Hypoxia

EXAM:
PORTABLE CHEST 1 VIEW

[chest ap]
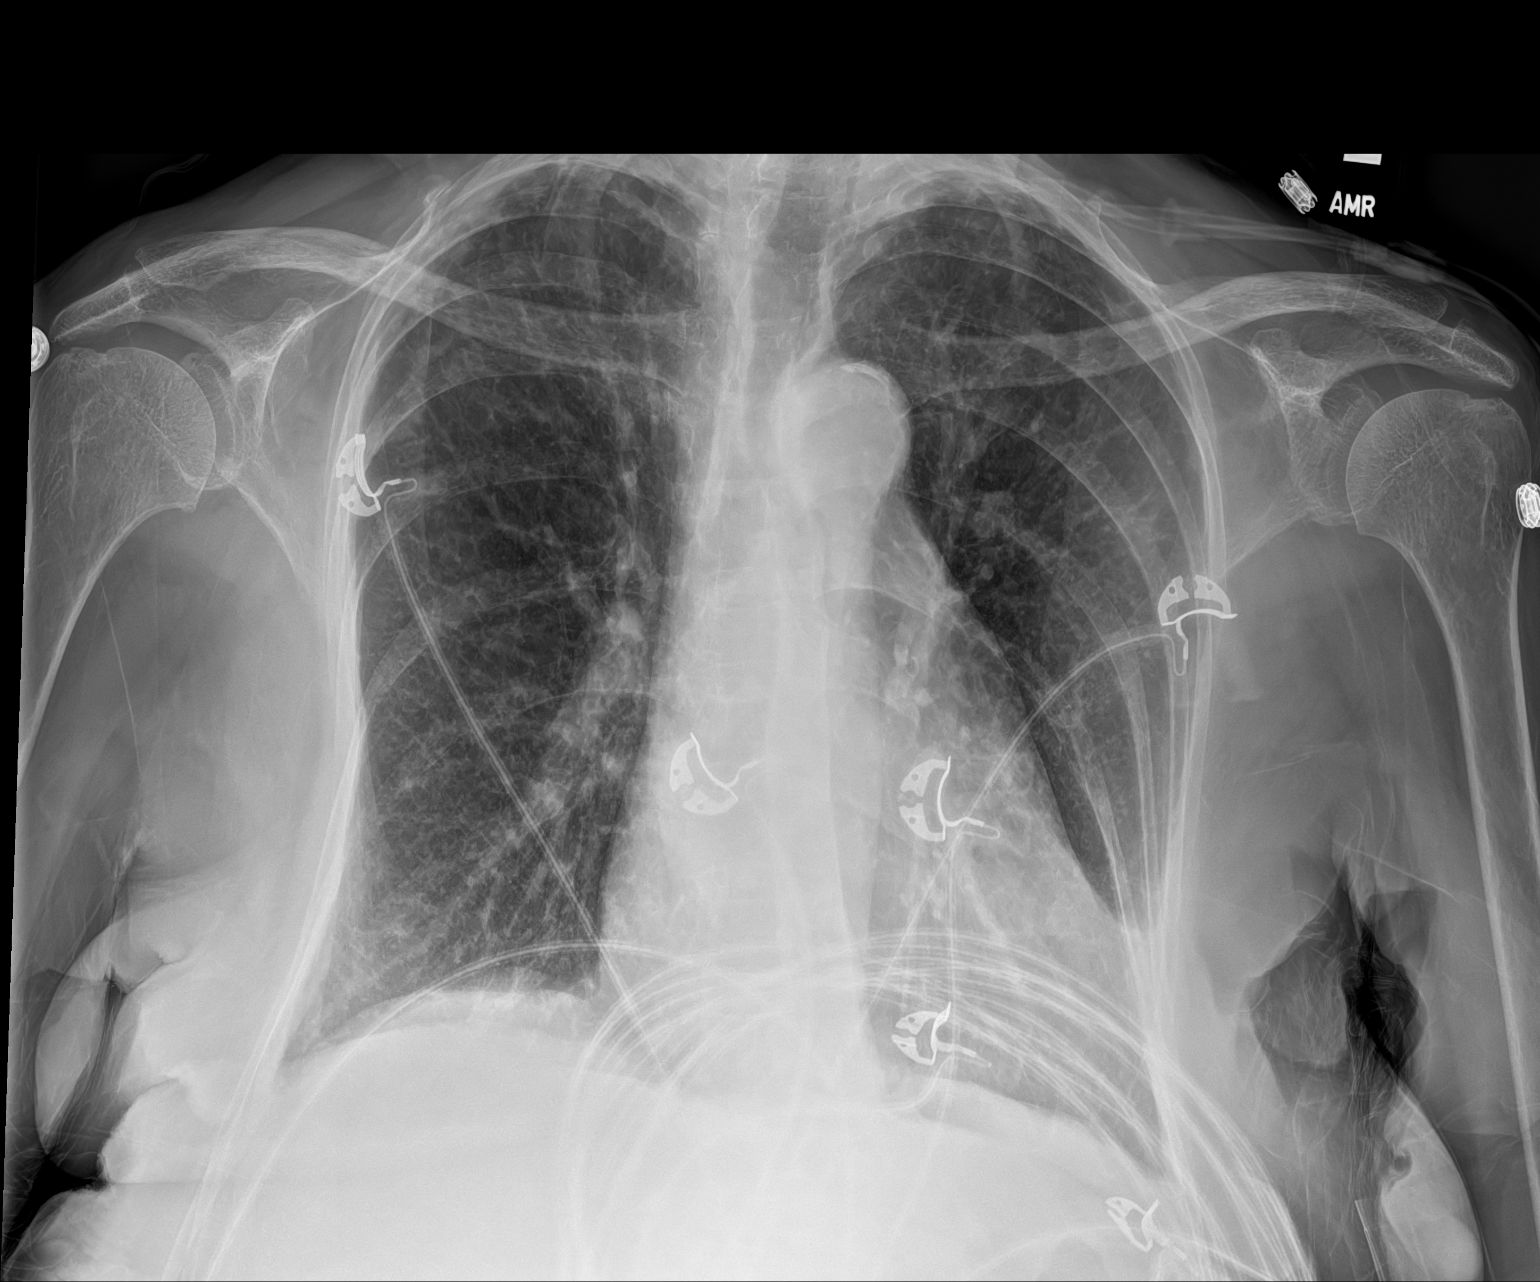

[1 of 1 positions shown; findings below may reference images not displayed]

FINDINGS: The heart is enlarged. Atherosclerotic calcification of aortic arch.
Chronic interstitial markings without evidence of focal
consolidation or pleural effusion. No acute osseous abnormality
IMPRESSION: No radiographic evidence of acute cardiopulmonary process.

## 2021-04-06 MED ORDER — SODIUM CHLORIDE 0.9 % IV BOLUS
500.0000 mL | Freq: Once | INTRAVENOUS | Status: AC
  Administered 2021-04-06: 500 mL via INTRAVENOUS

## 2021-04-06 MED ORDER — DIPHENOXYLATE-ATROPINE 2.5-0.025 MG PO TABS
1.0000 | ORAL_TABLET | Freq: Once | ORAL | Status: AC
  Administered 2021-04-06: 1 via ORAL
  Filled 2021-04-06: qty 1

## 2021-04-06 MED ORDER — DIPHENOXYLATE-ATROPINE 2.5-0.025 MG PO TABS
1.0000 | ORAL_TABLET | Freq: Four times a day (QID) | ORAL | 0 refills | Status: DC | PRN
Start: 2021-04-06 — End: 2021-09-06

## 2021-04-06 MED ORDER — IOHEXOL 300 MG/ML  SOLN
75.0000 mL | Freq: Once | INTRAMUSCULAR | Status: AC | PRN
  Administered 2021-04-06: 75 mL via INTRAVENOUS

## 2021-04-06 NOTE — Discharge Instructions (Addendum)
Make sure you are drinking plenty of fluids to avoid dehydration.  Your lab test today are stable as discussed.  I do encourage you to collect the stool samples when you have a bowel movement to return to your primary doctor on Monday.  In the interim you have been prescribed Lomotil to use in place of your Imodium to help you with your diarrhea if it returns.  Only take this medication if you continue to have diarrhea, it can cause significant constipation if you continue taking it without persistent symptoms.  As discussed your CT scan shows a abnormality in your right pelvis which may be an abnormal lymph node versus a small mass involving your right ovary and fallopian tube region.  It is recommended that you have a pelvic ultrasound to get more information about this finding.  Please discuss this with your primary doctor on Monday who can arrange this for you.

## 2021-04-06 NOTE — ED Notes (Signed)
Patient ambulatory to restroom  ?

## 2021-04-06 NOTE — ED Provider Notes (Signed)
[facility] EMERGENCY DEPARTMENT Provider Note   CSN: [id number] Arrival date & time: 04/06/21  1329     History  Chief Complaint  Patient presents with   Diarrhea    Renee Blake is a 88 y.o. female with a past medical history significant for chronic renal failure stage IIIb, atrial fibrillation, hypertension, history of CHF, on home oxygen for prn use, history of anemia and history of rectal cancer which was treated with radiation 4 years ago presenting for evaluation of generalized fatigue, suspected dehydration and diarrhea which has been present for more than 2 weeks.  She is reports at least 2-3 episodes of diarrhea daily, more with significant p.o. intake as she states any time she eats diarrhea is triggered.  She has had some nausea without emesis, denies fevers or chills.  She has started to have some blood in her stools as well, daughter at the bedside states that there was blood all over her bathroom when they found her this morning.  Of note, patient was seen by her PCP 2 days ago at which time lab tests were obtained, she was found to be significantly dehydrated and she received IV fluids in their office.  Her lab tests that day revealed a creatinine of 2.59.  She was instructed to increase fluids at home but this has not seem to be effective.  She does report generalized weakness, lightheadedness with standing.  Denies fevers or chills, chest pain, sob, no current nausea.  Denies abdominal or rectal pain.  The history is provided by the patient.      Home Medications Prior to Admission medications   Medication Sig Start Date End Date Taking? Authorizing Provider  acetaminophen (TYLENOL) 500 MG tablet Take 1,000 mg by mouth every 6 (six) hours as needed.   Yes [provider]  ALPRAZolam Duanne Moron) 0.5 MG tablet Take 0.5 mg by mouth at bedtime as needed. 10/11/20  Yes [provider]  diltiazem (CARDIZEM SR) 90 MG 12 hr capsule Take 1 capsule (90 mg total) by  mouth 2 (two) times daily. 05/17/20  Yes Verta Ellen., NP  diphenoxylate-atropine (LOMOTIL) 2.5-0.025 MG tablet Take 1 tablet by mouth 4 (four) times daily as needed for diarrhea or loose stools. 04/06/21  Yes Teri Diltz, Almyra Free, PA-C  levothyroxine (SYNTHROID) 50 MCG tablet Take 50 mcg by mouth daily. 07/11/20  Yes [provider]  loperamide (IMODIUM A-D) 2 MG tablet Take 4 mg by mouth daily as needed for diarrhea or loose stools.   Yes [provider]  metoprolol succinate (TOPROL-XL) 25 MG 24 hr tablet Take 1 tablet (25 mg total) by mouth daily. 05/17/20 04/06/21 Yes Verta Ellen., NP  ondansetron (ZOFRAN) 4 MG tablet Take 1 tablet (4 mg total) by mouth every 8 (eight) hours as needed for nausea or vomiting. 03/27/20  Yes Hayden Rasmussen, MD  potassium chloride (MICRO-K) 10 MEQ CR capsule Take 20 mEq by mouth daily. 03/10/21  Yes [provider]  torsemide (DEMADEX) 20 MG tablet Take 2 tablets (40 mg total) by mouth every morning. 03/28/21  Yes Donato Heinz, MD  traMADol (ULTRAM) 50 MG tablet Take 50 mg by mouth every 6 (six) hours as needed. 06/29/20  Yes [provider]  ALPRAZolam (XANAX) 0.25 MG tablet Take 1 tablet (0.25 mg total) by mouth 2 (two) times daily as needed. for anxiety Patient not taking: Reported on 04/06/2021 04/20/19   Heath Lark D, DO  pantoprazole (PROTONIX) 40 MG tablet  Take 40 mg by mouth daily. Patient not taking: Reported on 04/06/2021 10/11/20   [provider]  potassium chloride SA (KLOR-CON) 20 MEQ tablet Take 1 tablet (20 mEq total) by mouth daily. Patient not taking: Reported on 04/06/2021 05/17/20   Verta Ellen., NP      Allergies    Clonazepam, Codeine, and Sulfa antibiotics    Review of Systems   Review of Systems  Constitutional:  Positive for appetite change. Negative for chills and fever.  HENT:  Negative for congestion and sore throat.   Respiratory:  Negative for chest tightness and shortness  of breath.   Gastrointestinal:  Positive for blood in stool and diarrhea.  Neurological:  Positive for weakness.  All other systems reviewed and are negative.  Physical Exam Updated Vital Signs BP 138/80    Pulse 90    Temp 97.9 F (36.6 C)    Resp 13    SpO2 100%  Physical Exam Vitals and nursing note reviewed.  Constitutional:      Appearance: She is well-developed.  HENT:     Head: Normocephalic and atraumatic.  Eyes:     Conjunctiva/sclera: Conjunctivae normal.  Cardiovascular:     Rate and Rhythm: Regular rhythm.     Heart sounds: Normal heart sounds.  Pulmonary:     Effort: Pulmonary effort is normal.     Breath sounds: Normal breath sounds. No wheezing.     Comments: Of note, patient was initially 80% oxygen on room air.  She denies symptoms of shortness of breath. Abdominal:     General: Bowel sounds are normal.     Palpations: Abdomen is soft.     Tenderness: There is no abdominal tenderness.  Musculoskeletal:        General: Normal range of motion.     Cervical back: Normal range of motion.  Skin:    General: Skin is warm and dry.  Neurological:     Mental Status: She is alert.    ED Results / Procedures / Treatments   Labs (all labs ordered are listed, but only abnormal results are displayed) Labs Reviewed  BASIC METABOLIC PANEL - Abnormal; Notable for the following components:      Result Value   CO2 20 (*)    Creatinine, Ser 1.37 (*)    GFR, Estimated 37 (*)    All other components within normal limits  CBC WITH DIFFERENTIAL/PLATELET - Abnormal; Notable for the following components:   RBC 3.72 (*)    Hemoglobin 11.5 (*)    HCT 34.1 (*)    All other components within normal limits  BRAIN NATRIURETIC PEPTIDE - Abnormal; Notable for the following components:   B Natriuretic Peptide 198.0 (*)    All other components within normal limits  POC OCCULT BLOOD, ED - Abnormal  RESP PANEL BY RT-PCR (FLU A&B, COVID) ARPGX2  MAGNESIUM    EKG EKG  Interpretation  Date/Time:  Friday April 06 2021 14:06:06 EST Ventricular Rate:  60 PR Interval:    QRS Duration: 137 QT Interval:  450 QTC Calculation: 450 R Axis:   22 Text Interpretation: Atrial fibrillation Right bundle branch block Confirmed by Octaviano Glow [phone]) on 04/06/2021 2:52:24 PM  Radiology CT ABDOMEN PELVIS W CONTRAST  Result Date: 04/06/2021 CLINICAL DATA:  Lower GI bleed nausea EXAM: CT ABDOMEN AND PELVIS WITH CONTRAST TECHNIQUE: Multidetector CT imaging of the abdomen and pelvis was performed using the standard protocol following bolus administration of intravenous contrast. CONTRAST:  2m  OMNIPAQUE IOHEXOL 300 MG/ML  SOLN COMPARISON:  Chest x-ray 11/29/2020, CT 07/29/2020, 03/17/2019, 07/11/2017 FINDINGS: Lower chest: Lung bases demonstrate no acute consolidation or effusion. Mild cardiomegaly. Hepatobiliary: Dilated gallbladder without calcified stone or inflammation. Mild intra and extrahepatic biliary dilatation, stable to slightly increased, the common bile duct measures 10 mm. Pancreas: Atrophic. No inflammatory change. Mild prominence of pancreatic duct. Spleen: Normal in size without focal abnormality. Adrenals/Urinary Tract: Adrenal glands are normal. Cyst in the right kidney. Kidneys are atrophic. Small nonobstructing stone lower pole right kidney. Mild hydronephrosis of extrarenal pelvis and hydroureter on the right without visible obstructing stone. This is a chronic finding. Moderate bladder distension Stomach/Bowel: The stomach is nonenlarged. No dilated small bowel. Diffuse fluid within the colon suggestive of diarrheal illness. Diffuse mucosal enhancement of the colon without extravasation. No colon wall thickening. Vascular/Lymphatic: Moderate aortic atherosclerosis.  No aneurysm. Reproductive: Uterus unremarkable. Small complex septated/cystic lesions are seen in the right pelvis, the smaller anterior lesion measures 20 mm and the more posterior lesion measures  23 mm, series 4, image 62 and series 4, image 60. Other: Negative for pelvic effusion or free air. Musculoskeletal: Scoliosis and degenerative changes of the spine. No acute osseous abnormality. IMPRESSION: 1. Diffuse fluid within the colon suggesting diarrhea. There is mild diffuse mucosal enhancement/hyperemia of the colon but no acute wall thickening or obvious extravasation. 2. Dilated gallbladder without calcified stone. Mild intra and extrahepatic biliary dilatation which is stable to slightly increased compared to previous exams. 3. Right pelvic complex septated/cystic lesions measuring up to 23 mm, raising concern for adnexal neoplasm versus abnormal necrotic pelvic nodes. Correlation with nonemergent pelvic ultrasound may be performed if deemed clinically appropriate. 4. Atrophic kidneys. 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 Small nonobstructing stone lower pole right kidney. Electronically Signed   By: Donavan Foil M.D.   On: 04/06/2021 18:25   DG Chest Portable 1 View  Result Date: 04/06/2021 CLINICAL DATA:  Hypoxia EXAM: PORTABLE CHEST 1 VIEW COMPARISON:  Chest radiograph dated January 29, 2021 FINDINGS: The heart is enlarged. Atherosclerotic calcification of aortic arch. Chronic interstitial markings without evidence of focal consolidation or pleural effusion. No acute osseous abnormality IMPRESSION: No radiographic evidence of acute cardiopulmonary process. Electronically Signed   By: Keane Police D.O.   On: 04/06/2021 14:29    Procedures Procedures    Medications Ordered in ED Medications  sodium chloride 0.9 % bolus 500 mL (0 mLs Intravenous Stopped 04/06/21 1700)  iohexol (OMNIPAQUE) 300 MG/ML solution 75 mL (75 mLs Intravenous Contrast Given 04/06/21 1749)  diphenoxylate-atropine (LOMOTIL) 2.5-0.025 MG per tablet 1 tablet (1 tablet Oral Given 04/06/21 2009)    ED Course/ Medical Decision Making/ A&P                           Medical Decision Making  This patient  presents to the ED for concern of diarrhea and weakness, bloody stools, this involves an extensive number of treatment options, and is a complaint that carries with it a high risk of complications and morbidity.  The differential diagnosis includes functional versus infectious diarrhea, GI bleed, colitis, complications from the possibility of a recurrence of her anorectal cancer, diverticulitis, ischemic colitis.   Co morbidities that complicate the patient evaluation  Patient has a history of chronic diarrhea intermittently, also history of anorectal cancer with history of GI bleeding,   Additional history obtained:  Additional history obtained from daughters at bedside, prior records.  Lab Tests:  I Ordered, and personally interpreted labs.  The pertinent results include: Her be met is significant for an elevated creatinine of 1.37, however this lab test is significantly improved in comparison to her creatinine from her PCPs office just 2 days prior.  Her BUN is normal, reducing the likelihood that her bleeding is from an upper GI source.  Her hemoglobin is stable at 11.5, it was actually 10.34 months ago so this is improved today.   Imaging Studies ordered:  I ordered imaging studies including chest x-ray given her initial hypoxia, CT imaging of abdomen and pelvis I independently visualized and interpreted imaging which showed normal lung exam.  CT imaging of her abdomen and pelvis showing fluid in her colon suggesting a diarrheal process.  No signs of obstruction or inflammatory changes of the bowel walls.  She does have a dilated gallbladder without signs of cholecystitis which is stable in comparison to prior imaging..  Of note patient does not have abdominal pain, nausea or vomiting.  There is a septated mass/lesion in her right lower pelvis, possibly an adnexal mass versus abnormal pelvic lymph nodes. I agree with the radiologist interpretation   Cardiac Monitoring:  The patient  was maintained on a cardiac monitor.  I personally viewed and interpreted the cardiac monitored which showed an underlying rhythm of: Rate controlled atrial fibrillation   Medicines ordered and prescription drug management:  I ordered medication including IV fluids and Lomotil for dehydration and diarrhea Reevaluation of the patient after these medicines showed that the patient improved I have reviewed the patients home medicines and have made adjustments as needed   Test Considered:  Ultrasound was recommended based on the possible adnexal mass seen on today's CT scan.  Ultrasound is not available at this time, however this was discussed with patient who has a follow-up with her PCP in 3 days.  She was also given information regarding this finding and her need to discuss with her PCP for having this outpatient test arranged.  I also considered stool studies including stool panel and a C. difficile.  However she states that her PCP gave her a collection kit at her visit this week but has not yet collected the sample.  She has had no diarrhea during her visit here today.  She was encouraged to collect the samples and take them to her appointment with her PCP on Monday.   Critical Interventions:  N/A    Problem List / ED Course:  Patient with complaints of diarrhea, however she has had no diarrhea while here.  Her labs are stable today.  She is occultly Hemoccult positive for blood on rectal exam however her hemoglobin is stable.  She was felt stable for discharge home with close follow-up.`   Reevaluation:  After the interventions noted above, I reevaluated the patient and found that they have :improved   Social Determinants of Health:  N/a   Dispostion:  After consideration of the diagnostic results and the patients response to treatment, I feel that the patent would benefit from close outpatient follow-up with her PCP along with Lomotil.  She was given a dose of this here  with prescription for additional medication if symptoms of diarrhea continued.  She was also given strict return precautions for any new or worsening symptoms.  Of note, patient had several readings of hypoxia while here, she is on home oxygen for as needed use and denies any complaints of shortness of breath today.  Final Clinical Impression(s) / ED Diagnoses Final diagnoses:  Diarrhea, unspecified type  Adnexal mass    Rx / DC Orders ED Discharge Orders          Ordered    diphenoxylate-atropine (LOMOTIL) 2.5-0.025 MG tablet  4 times daily PRN        04/06/21 1959              Evalee Jefferson, PA-C 04/06/21 2139    Wyvonnia Dusky, MD 04/07/21 [phone]

## 2021-04-06 NOTE — ED Triage Notes (Signed)
Diarrhea for over 2 weeks

## 2021-04-09 ENCOUNTER — Other Ambulatory Visit (HOSPITAL_COMMUNITY): Payer: Self-pay | Admitting: Family Medicine

## 2021-04-09 ENCOUNTER — Other Ambulatory Visit: Payer: Self-pay | Admitting: Family Medicine

## 2021-04-09 DIAGNOSIS — R531 Weakness: Secondary | ICD-10-CM | POA: Diagnosis not present

## 2021-04-09 DIAGNOSIS — N390 Urinary tract infection, site not specified: Secondary | ICD-10-CM | POA: Diagnosis not present

## 2021-04-09 DIAGNOSIS — R1900 Intra-abdominal and pelvic swelling, mass and lump, unspecified site: Secondary | ICD-10-CM | POA: Diagnosis not present

## 2021-04-09 DIAGNOSIS — R195 Other fecal abnormalities: Secondary | ICD-10-CM | POA: Diagnosis not present

## 2021-04-09 DIAGNOSIS — I509 Heart failure, unspecified: Secondary | ICD-10-CM | POA: Diagnosis not present

## 2021-04-09 DIAGNOSIS — E860 Dehydration: Secondary | ICD-10-CM | POA: Diagnosis not present

## 2021-04-12 ENCOUNTER — Encounter (INDEPENDENT_AMBULATORY_CARE_PROVIDER_SITE_OTHER): Payer: Self-pay | Admitting: *Deleted

## 2021-04-13 ENCOUNTER — Other Ambulatory Visit: Payer: Self-pay

## 2021-04-13 ENCOUNTER — Ambulatory Visit (HOSPITAL_COMMUNITY)
Admission: RE | Admit: 2021-04-13 | Discharge: 2021-04-13 | Disposition: A | Discharge status: Home / Self Care | Payer: Medicare Other | Source: Ambulatory Visit | Attending: Family Medicine | Admitting: Family Medicine

## 2021-04-13 ENCOUNTER — Other Ambulatory Visit (HOSPITAL_COMMUNITY): Payer: Self-pay | Admitting: Family Medicine

## 2021-04-13 DIAGNOSIS — R1900 Intra-abdominal and pelvic swelling, mass and lump, unspecified site: Secondary | ICD-10-CM | POA: Diagnosis not present

## 2021-04-13 IMAGING — US US PELVIS COMPLETE
1 series · 14 of 25 positions shown · non-contrast
Comparison: Abdomen and pelvis CT dated [DATE]

CLINICAL DATA: Pelvic mass seen on CT.

EXAM:
TRANSABDOMINAL ULTRASOUND OF PELVIS
TECHNIQUE: Transabdominal ultrasound examination of the pelvis was performed
including evaluation of the uterus, ovaries, adnexal regions, and
pelvic cul-de-sac. The patient refused the transvaginal portion of
the study.

[Series 1: us pelvic complete with transvaginal · 14 of 35 slices shown]
[im 1/35]
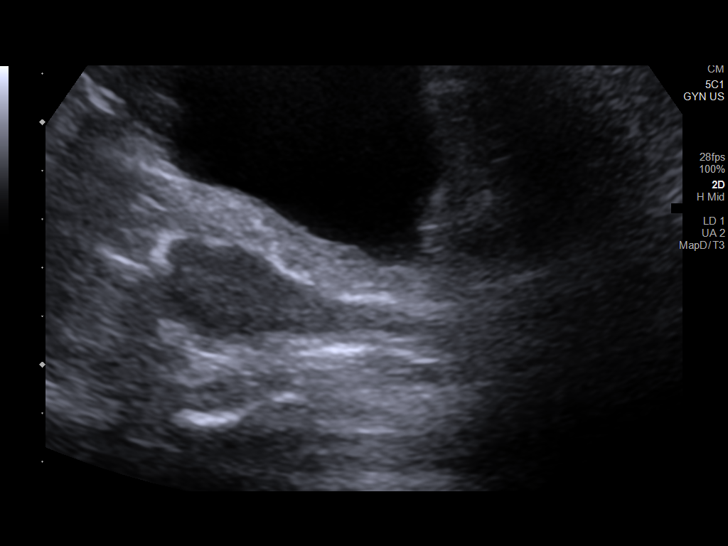
[im 3/35]
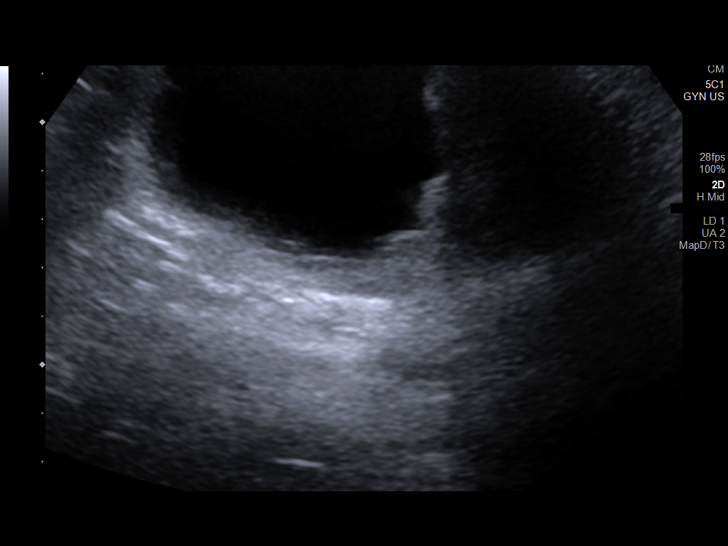
[im 6/35]
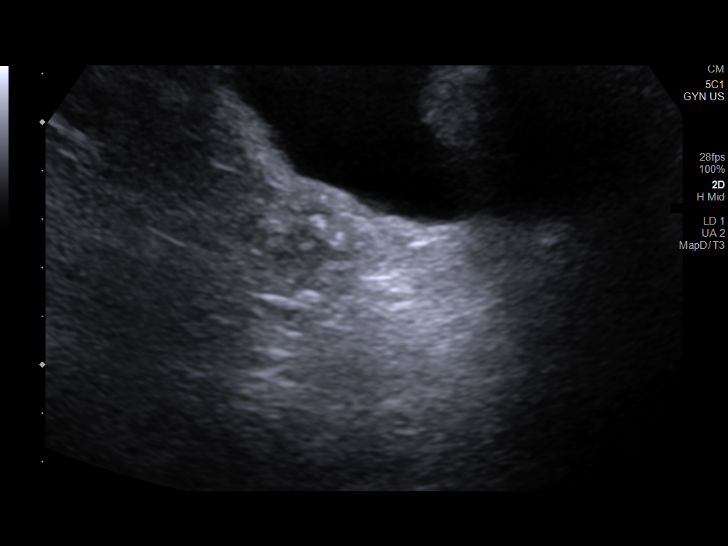
[im 9/35]
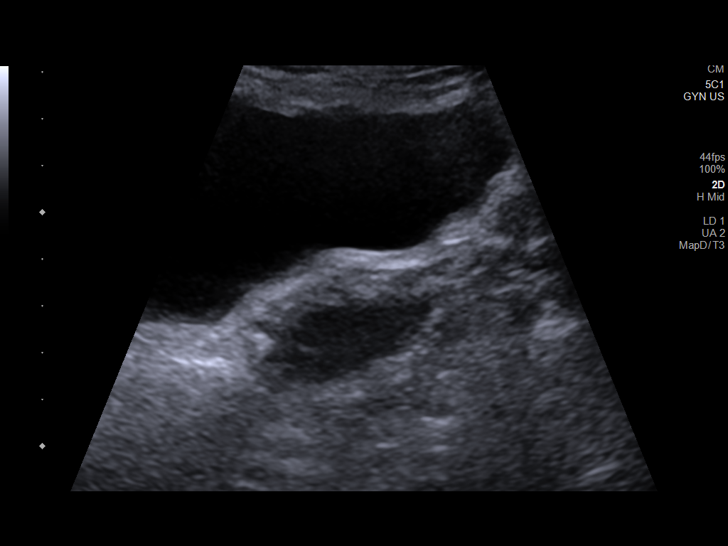
[im 12/35]
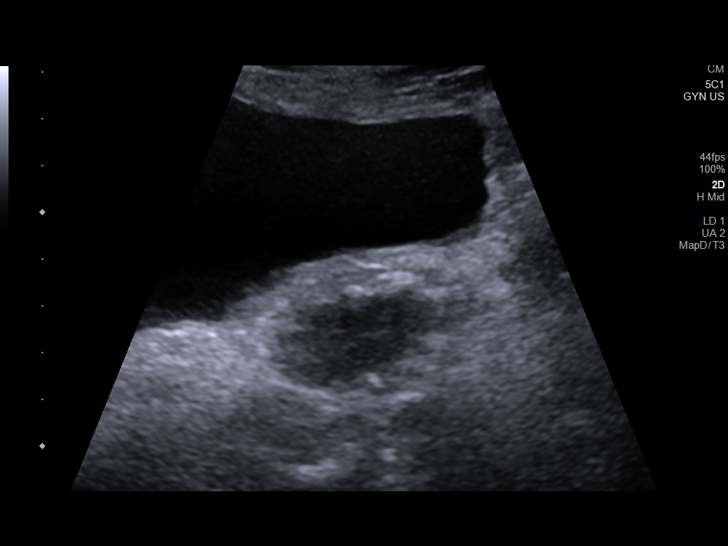
[im 13/35]
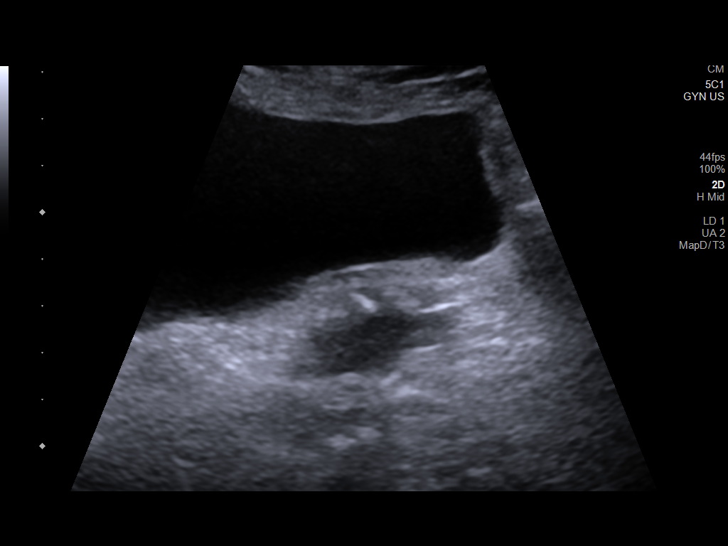
[im 16/35]
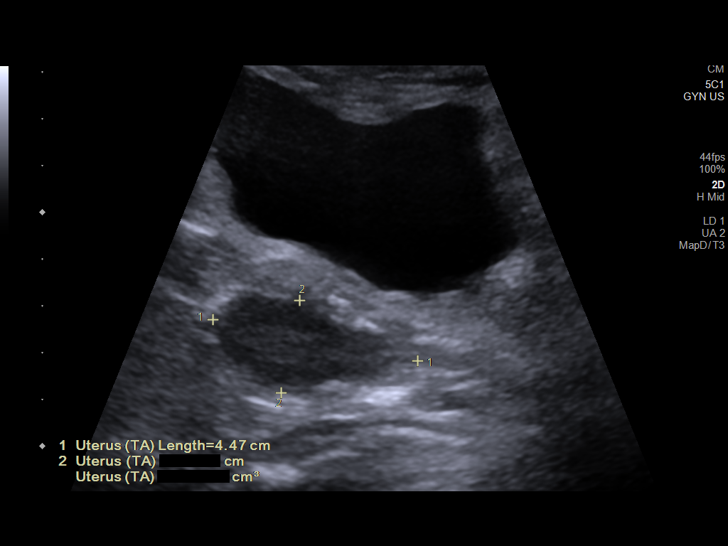
[im 19/35]
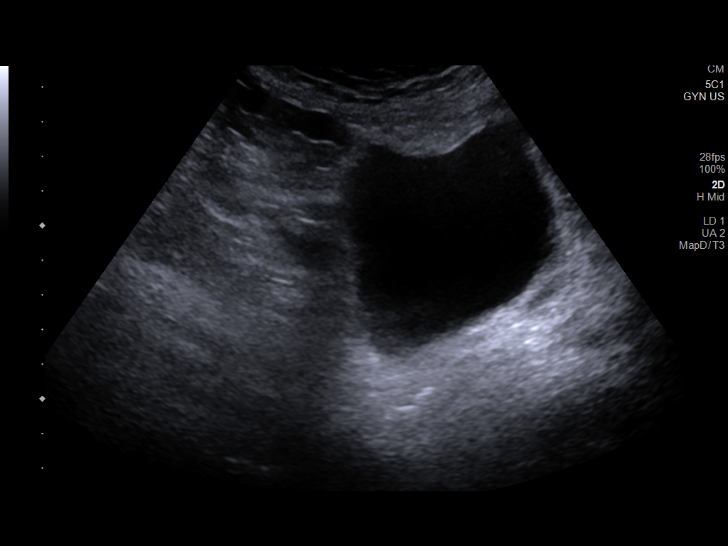
[im 22/35]
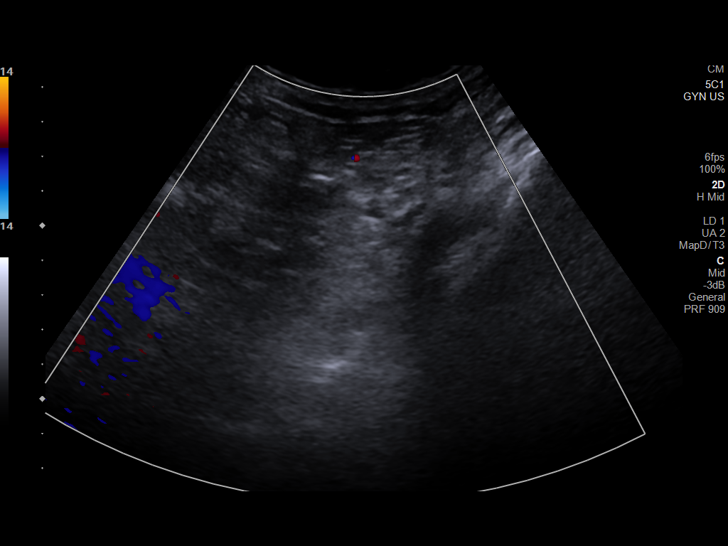
[im 23/35]
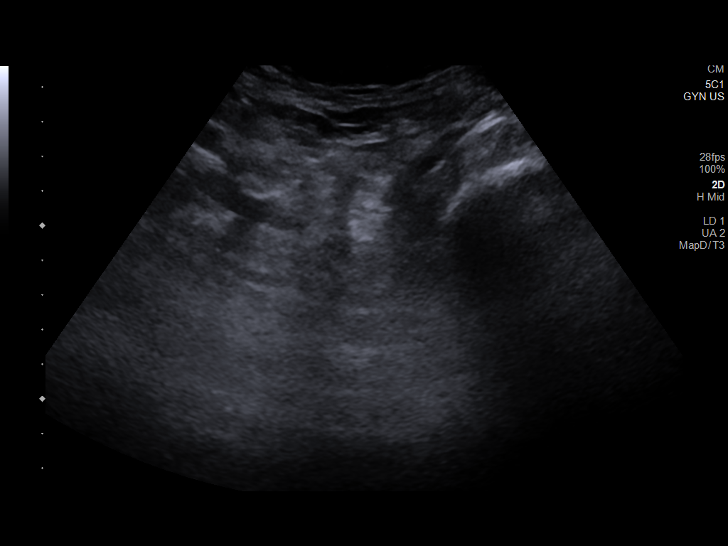
[im 26/35]
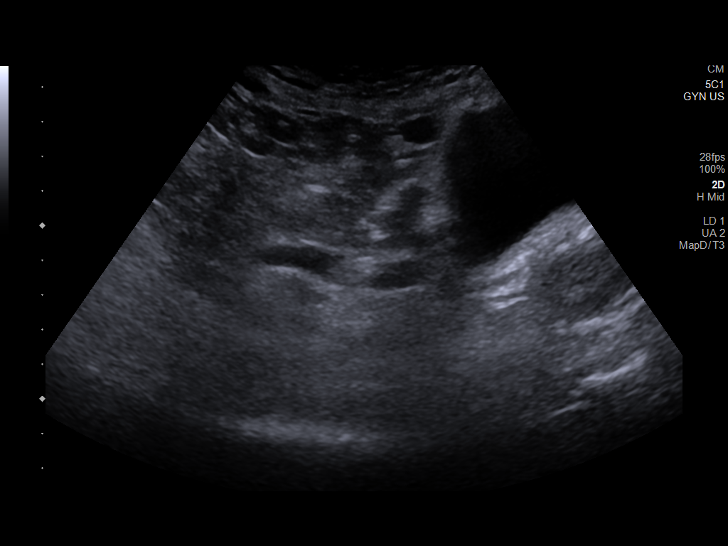
[im 29/35]
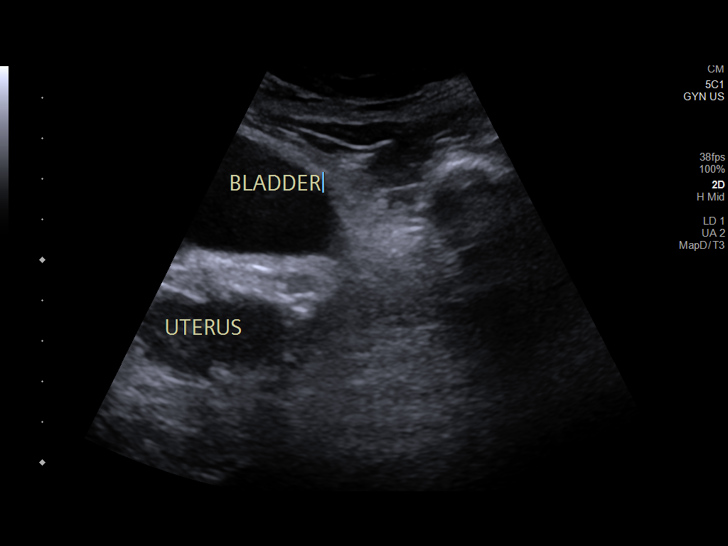
[im 32/35]
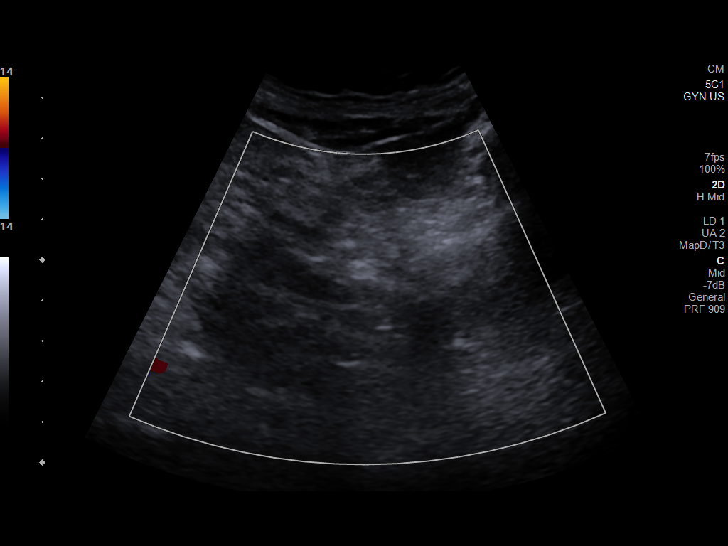
[im 35/35]
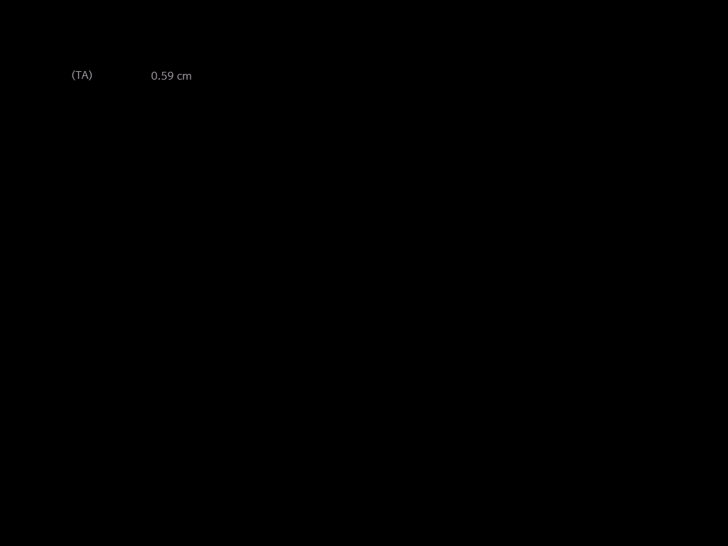

[14 of 25 positions shown; findings below may reference images not displayed]

FINDINGS: Uterus

Measurements: 4.5 cm x 2.0 cm x 3.2 cm (seen only on transabdominal
view, as the patient refused the transvaginal portion of the study)
= volume: 15.0 mL. No fibroids or other mass visualized.

Endometrium

Thickness: 5.9 mm.  No focal abnormality visualized.

Right ovary

The right ovary is not visualized.

Left ovary

The left ovary is not visualized.

Other findings:  No pelvic free fluid is seen.
IMPRESSION: 1. Unremarkable ultrasonographic appearance of the uterus and
endometrium.
2. Nonvisualization of the bilateral ovaries.
3. No evidence to suggest the presence of a pelvic mass. Correlation
with follow-up abdomen pelvis CT is recommended if a pelvic mass
remains of clinical concern.

## 2021-04-16 DIAGNOSIS — Z9181 History of falling: Secondary | ICD-10-CM | POA: Diagnosis not present

## 2021-04-16 DIAGNOSIS — E039 Hypothyroidism, unspecified: Secondary | ICD-10-CM | POA: Diagnosis not present

## 2021-04-16 DIAGNOSIS — E860 Dehydration: Secondary | ICD-10-CM | POA: Diagnosis not present

## 2021-04-16 DIAGNOSIS — I509 Heart failure, unspecified: Secondary | ICD-10-CM | POA: Diagnosis not present

## 2021-04-16 DIAGNOSIS — I10 Essential (primary) hypertension: Secondary | ICD-10-CM | POA: Diagnosis not present

## 2021-04-16 DIAGNOSIS — K580 Irritable bowel syndrome with diarrhea: Secondary | ICD-10-CM | POA: Diagnosis not present

## 2021-04-16 DIAGNOSIS — R531 Weakness: Secondary | ICD-10-CM | POA: Diagnosis not present

## 2021-04-16 DIAGNOSIS — N390 Urinary tract infection, site not specified: Secondary | ICD-10-CM | POA: Diagnosis not present

## 2021-04-19 DIAGNOSIS — G3184 Mild cognitive impairment, so stated: Secondary | ICD-10-CM | POA: Diagnosis not present

## 2021-04-19 DIAGNOSIS — Z515 Encounter for palliative care: Secondary | ICD-10-CM | POA: Diagnosis not present

## 2021-04-26 DIAGNOSIS — Z9181 History of falling: Secondary | ICD-10-CM | POA: Diagnosis not present

## 2021-04-26 DIAGNOSIS — E039 Hypothyroidism, unspecified: Secondary | ICD-10-CM | POA: Diagnosis not present

## 2021-04-26 DIAGNOSIS — I10 Essential (primary) hypertension: Secondary | ICD-10-CM | POA: Diagnosis not present

## 2021-04-26 DIAGNOSIS — E860 Dehydration: Secondary | ICD-10-CM | POA: Diagnosis not present

## 2021-04-26 DIAGNOSIS — K580 Irritable bowel syndrome with diarrhea: Secondary | ICD-10-CM | POA: Diagnosis not present

## 2021-04-26 DIAGNOSIS — R531 Weakness: Secondary | ICD-10-CM | POA: Diagnosis not present

## 2021-04-26 DIAGNOSIS — I509 Heart failure, unspecified: Secondary | ICD-10-CM | POA: Diagnosis not present

## 2021-04-26 DIAGNOSIS — N390 Urinary tract infection, site not specified: Secondary | ICD-10-CM | POA: Diagnosis not present

## 2021-05-01 DIAGNOSIS — I509 Heart failure, unspecified: Secondary | ICD-10-CM | POA: Diagnosis not present

## 2021-05-01 DIAGNOSIS — I10 Essential (primary) hypertension: Secondary | ICD-10-CM | POA: Diagnosis not present

## 2021-05-01 DIAGNOSIS — R531 Weakness: Secondary | ICD-10-CM | POA: Diagnosis not present

## 2021-05-01 DIAGNOSIS — E860 Dehydration: Secondary | ICD-10-CM | POA: Diagnosis not present

## 2021-05-01 DIAGNOSIS — N390 Urinary tract infection, site not specified: Secondary | ICD-10-CM | POA: Diagnosis not present

## 2021-05-01 DIAGNOSIS — K580 Irritable bowel syndrome with diarrhea: Secondary | ICD-10-CM | POA: Diagnosis not present

## 2021-05-01 DIAGNOSIS — J9621 Acute and chronic respiratory failure with hypoxia: Secondary | ICD-10-CM | POA: Diagnosis not present

## 2021-05-01 DIAGNOSIS — Z9181 History of falling: Secondary | ICD-10-CM | POA: Diagnosis not present

## 2021-05-01 DIAGNOSIS — E039 Hypothyroidism, unspecified: Secondary | ICD-10-CM | POA: Diagnosis not present

## 2021-05-03 ENCOUNTER — Telehealth (INDEPENDENT_AMBULATORY_CARE_PROVIDER_SITE_OTHER): Payer: Self-pay | Admitting: Internal Medicine

## 2021-05-03 NOTE — Telephone Encounter (Signed)
I talked with patient's daughter Marlowe Kays and went over the CT results with her. No rectal mass or regular adenopathy.  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 I see her gallbladder is distended and resistance mildly dilated.  She has poor extra and intrahepatic biliary dilation. No recent LFTs in epic. I recommended patient should have LFTs when she has other blood work next week.  We will wait for the results.

## 2021-05-07 DIAGNOSIS — K580 Irritable bowel syndrome with diarrhea: Secondary | ICD-10-CM | POA: Diagnosis not present

## 2021-05-07 DIAGNOSIS — E039 Hypothyroidism, unspecified: Secondary | ICD-10-CM | POA: Diagnosis not present

## 2021-05-07 DIAGNOSIS — I10 Essential (primary) hypertension: Secondary | ICD-10-CM | POA: Diagnosis not present

## 2021-05-07 DIAGNOSIS — I509 Heart failure, unspecified: Secondary | ICD-10-CM | POA: Diagnosis not present

## 2021-05-07 DIAGNOSIS — Z9181 History of falling: Secondary | ICD-10-CM | POA: Diagnosis not present

## 2021-05-07 DIAGNOSIS — R531 Weakness: Secondary | ICD-10-CM | POA: Diagnosis not present

## 2021-05-07 DIAGNOSIS — E860 Dehydration: Secondary | ICD-10-CM | POA: Diagnosis not present

## 2021-05-07 DIAGNOSIS — N390 Urinary tract infection, site not specified: Secondary | ICD-10-CM | POA: Diagnosis not present

## 2021-05-21 ENCOUNTER — Encounter (INDEPENDENT_AMBULATORY_CARE_PROVIDER_SITE_OTHER): Payer: Self-pay | Admitting: Gastroenterology

## 2021-05-21 ENCOUNTER — Other Ambulatory Visit: Payer: Self-pay

## 2021-05-21 ENCOUNTER — Ambulatory Visit (INDEPENDENT_AMBULATORY_CARE_PROVIDER_SITE_OTHER): Payer: Medicare Other | Admitting: Gastroenterology

## 2021-05-21 VITALS — BP 112/75 | HR 68 | Temp 97.5°F | Ht 66.0 in | Wt 114.0 lb

## 2021-05-21 DIAGNOSIS — R197 Diarrhea, unspecified: Secondary | ICD-10-CM | POA: Diagnosis not present

## 2021-05-21 DIAGNOSIS — K625 Hemorrhage of anus and rectum: Secondary | ICD-10-CM

## 2021-05-21 NOTE — Patient Instructions (Addendum)
I am glad you are feeling better, it is reassuring that you have had no further rectal bleeding and acute diarrhea resolved, this was likely secondary to an acute gastroenteritis. You can continue to use imodium as needed like you are doing, make sure you are staying well hydrated. If you have any further bleeding or worsening diarrhea, please let me know.   Follow up 1 year

## 2021-05-21 NOTE — Progress Notes (Signed)
Referring Provider: [name], MD Primary Care Physician:  [name], MD Primary GI Physician: Jenetta Downer  Chief Complaint  Patient presents with   Blood In Stools    Pt arrives with daughter Renee Blake. Has seen some blood in stools, has diarrhea at times and constipation.    HPI:   Renee Blake is a 88 y.o. female with past medical history of atrial fibrillation, esophageal stricture, hypertension, hyperlipidemia, ?  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   Last seen October 2022 for nausea and vomiting as well as chronic diarrhea, with occasional blood when wiping, endorsed some occasional constipation. She was recommended to have EGD thereafter for further evaluation, however, she was lost to follow up for this.   Patient had recent ED visit 04/06/21 for diarrhea with multiple episodes of watery diarrhea with one episode where blood was in the commode when her daughter went to clean her up. She denied any abdominal pain, nausea or vomiting. Hgb was 11.5 at that time actually improved from a few months prior at 10.3. She underwent CT A/P with findings of dilated gallbladder and septated mass/lesion in R lower pelvis with recommendations for follow up Pelvic US which she had on 04/13/21 that was unremarkable. Notably she had positive FOBT in ED.   She states that now back to having a mix of constipation and diarrhea, which is baseline for her, she reports that she kind of goes back and forth between diarrhea and constipation. She states that she will have a few episodes of diarrhea when she has it, this may last 2 days and then she will go back to having more constipation. She denies any associated abdominal pain, vomiting. She has some occasional nausea upon waking in the morning, but this is not a new  symptom.  She denies any further episodes of rectal bleeding or melena. She denies any acid reflux or heartburn. Was down about 6 pounds during acute phase of diarrhea in January, however, weight has gone back up to her baseline. She is taking 1 imodium each morning on days that she has diarrhea which provides relief for her.    Patient had recent labs with pcp and has follow up to have repeats in April.   Last Colonoscopy:2019The digital rectal exam revealed a 5 cm (diameter) hard rectal mass. The mass was non-circumferential and located predominantly at the anterior bowel wall. Biopsies were taken with a cold forceps for histology. A 6 mm polyp was found in the distal sigmoid colon. The polyp was semi-pedunculated. To prevent bleeding after the polypectomy, one hemostatic clip was successfully placed (MR conditional). There was no bleeding during, or at the end, of the procedure. The exam was otherwise normal throughout the examined colon.  Last Endoscopy:2014 duodenal ulcer treated with BiCap and epi, also presence of duodenal deformity, hiatal hernia  Recommendations:    Past Medical History:  Diagnosis Date   Acute blood loss anemia    Atrial fibrillation ([place])    a. diagnosed in 12/2018. Rate-control pursued given not a candidate for anticoagulation   Chronic diarrhea    Dilation of biliary tract    Duodenal stricture    Esophageal stricture    Gastric ulcer    GI bleed    Hyperlipemia    Hypertension    Microscopic colitis  Nephrolithiasis    Pancreatic duct dilated    Schatzki's ring    Skin cancer    Sliding hiatal hernia    Vertigo     Past Surgical History:  Procedure Laterality Date   BIOPSY  07/28/2017   Procedure: BIOPSY;  Surgeon: Rogene Houston, MD;  Location: [facility];  Service: Endoscopy;;  rectum   COLONOSCOPY N/A 07/28/2017   Procedure: COLONOSCOPY;  Surgeon: Rogene Houston, MD;  Location: [facility];  Service: Endoscopy;  Laterality: N/A;    ESOPHAGOGASTRODUODENOSCOPY N/A 02/27/2013   Procedure: ESOPHAGOGASTRODUODENOSCOPY (EGD);  Surgeon: Ladene Artist, MD;  Location: [facility] ENDOSCOPY;  Service: Endoscopy;  Laterality: N/A;   ESOPHAGOGASTRODUODENOSCOPY N/A 03/28/2013   Procedure: ESOPHAGOGASTRODUODENOSCOPY (EGD);  Surgeon: Rogene Houston, MD;  Location: [facility];  Service: Endoscopy;  Laterality: N/A;   HOT HEMOSTASIS N/A 02/27/2013   Procedure: HOT HEMOSTASIS (ARGON PLASMA COAGULATION/BICAP);  Surgeon: Ladene Artist, MD;  Location: [facility] ENDOSCOPY;  Service: Endoscopy;  Laterality: N/A;   NOSE SURGERY     for skin cancer   POLYPECTOMY  07/28/2017   Procedure: POLYPECTOMY;  Surgeon: Rogene Houston, MD;  Location: [facility];  Service: Endoscopy;;  colon     Current Outpatient Medications  Medication Sig Dispense Refill   acetaminophen (TYLENOL) 500 MG tablet Take 1,000 mg by mouth every 6 (six) hours as needed.     ALPRAZolam (XANAX) 0.25 MG tablet Take 1 tablet (0.25 mg total) by mouth 2 (two) times daily as needed. for anxiety 8 tablet 0   ALPRAZolam (XANAX) 0.5 MG tablet Take 0.5 mg by mouth at bedtime as needed.     diltiazem (CARDIZEM SR) 90 MG 12 hr capsule Take 1 capsule (90 mg total) by mouth 2 (two) times daily. 180 capsule 1   diphenoxylate-atropine (LOMOTIL) 2.5-0.025 MG tablet Take 1 tablet by mouth 4 (four) times daily as needed for diarrhea or loose stools. 30 tablet 0   levothyroxine (SYNTHROID) 50 MCG tablet Take 50 mcg by mouth daily.     loperamide (IMODIUM A-D) 2 MG tablet Take 4 mg by mouth daily as needed for diarrhea or loose stools.     metoprolol succinate (TOPROL-XL) 25 MG 24 hr tablet Take 1 tablet (25 mg total) by mouth daily. 90 tablet 1   ondansetron (ZOFRAN) 4 MG tablet Take 1 tablet (4 mg total) by mouth every 8 (eight) hours as needed for nausea or vomiting. 20 tablet 0   potassium chloride SA (KLOR-CON) 20 MEQ tablet Take 1 tablet (20 mEq total) by mouth daily. (Patient taking differently:  Take 20 mEq by mouth. Takes one every other day) 90 tablet 1   Probiotic Product (PROBIOTIC DAILY PO) Take by mouth. One daily. Not sure of name but has to be refrigerated.     torsemide (DEMADEX) 20 MG tablet Take 2 tablets (40 mg total) by mouth every morning. 60 tablet 6   traMADol (ULTRAM) 50 MG tablet Take 50 mg by mouth every 6 (six) hours as needed.     No current facility-administered medications for this visit.    Allergies as of 05/21/2021 - Review Complete 04/06/2021  Allergen Reaction Noted   Clonazepam Other (See Comments) 06/09/2017   Codeine Nausea And Vomiting and Other (See Comments) 08/24/2012   Sulfa antibiotics Other (See Comments) 08/24/2012    Family History  Problem Relation Age of Onset   Heart attack Father     Social History   Socioeconomic History  Marital status: Widowed    Spouse name: Not on file   Number of children: Not on file   Years of education: Not on file   Highest education level: Not on file  Occupational History   Not on file  Tobacco Use   Smoking status: Never   Smokeless tobacco: Never  Vaping Use   Vaping Use: Never used  Substance and Sexual Activity   Alcohol use: No    Alcohol/week: 0.0 standard drinks   Drug use: No   Sexual activity: Not on file  Other Topics Concern   Not on file  Social History Narrative   Not on file   Social Determinants of Health   Financial Resource Strain: Not on file  Food Insecurity: Not on file  Transportation Needs: Not on file  Physical Activity: Not on file  Stress: Not on file  Social Connections: Not on file   Review of systems General: negative for malaise, night sweats, fever, chills, weight loss Neck: Negative for lumps, goiter, pain and significant neck swelling Resp: Negative for cough, wheezing, dyspnea at rest CV: Negative for chest pain, leg swelling, palpitations, orthopnea GI: denies melena, hematochezia, vomiting, dysphagia, odyonophagia, early satiety or  unintentional weight loss. +constipation +diarrhea +nausea  MSK: Negative for joint pain or swelling, back pain, and muscle pain. Derm: Negative for itching or rash Psych: Denies depression, anxiety, memory loss, confusion. No homicidal or suicidal ideation.  Heme: Negative for prolonged bleeding, bruising easily, and swollen nodes. Endocrine: Negative for cold or heat intolerance, polyuria, polydipsia and goiter. Neuro: negative for tremor, gait imbalance, syncope and seizures. The remainder of the review of systems is noncontributory.  Physical Exam: BP 112/75 (BP Location: Left Arm, Patient Position: Sitting, Cuff Size: Normal)    Pulse 68    Temp (!) 97.5 F (36.4 C) (Oral)    Ht 5\' 6"  (1.676 m)    Wt 114 lb (51.7 kg)    BMI 18.40 kg/m  General:   Alert and oriented. No distress noted. Pleasant and cooperative.  Head:  Normocephalic and atraumatic. Eyes:  Conjuctiva clear without scleral icterus. Mouth:  Oral mucosa pink and moist. Good dentition. No lesions. Heart: Normal rate and rhythm, s1 and s2 heart sounds present.  Lungs: Clear lung sounds in all lobes. Respirations equal and unlabored. Abdomen:  +BS, soft, non-tender and non-distended. No rebound or guarding. No HSM or masses noted. Derm: No palmar erythema or jaundice Msk:  Symmetrical without gross deformities. Normal posture. Extremities:  Without edema. Neurologic:  Alert and  oriented x4 Psych:  Alert and cooperative. Normal mood and affect.  Invalid input(s): 6 MONTHS   ASSESSMENT: Renee Blake is a 88 y.o. female presenting today for diarrhea and rectal bleeding.  Patient had acute phase of profuse diarrhea with one episode of rectal bleeding and positive FOBT in early January. She reports that she is now back to baseline and alternates between constipation and diarrhea. She takes 1 imodium on days that she has diarrhea which seems to provide good results. She has not had any further episodes of rectal bleeding.  Denies any melena, abdominal pain, changes in appetite, weight loss, early satiety, dysphagia, odynophagia or vomiting. Still has some occasional nausea at times upon waking but this is not a new symptom. I discussed with patient and daughter that it is reassuring that patient has no current alarm symptoms and profuse diarrhea subsided, she has had no further rectal bleeding, symptoms were likely related to an acute gastroenteritis.  At this time, I think it is reasonable, given her age and co-morbidities to hold off on any further evaluation with endoscopic procedures, however, if she has recurrent profuse diarrhea or rectal bleeding she will let me know. All questions were answered, patient and daughter verbalized understanding and are in agreement with plan as outline above.    PLAN:  Continue imodium as needed, do not exceed 4 tablets/24 hours 2. Patient will let me know if she has further frequent diarrhea or rectal bleeding, will consider Flex sig at that time 3. Continue to eat a balanced diet and stay well hydrated   Follow Up: 1 year  Torian Thoennes L. Alver Sorrow, MSN, APRN, AGNP-C Adult-Gerontology Nurse Practitioner [facility]

## 2021-06-05 DIAGNOSIS — R300 Dysuria: Secondary | ICD-10-CM | POA: Diagnosis not present

## 2021-06-05 DIAGNOSIS — B3731 Acute candidiasis of vulva and vagina: Secondary | ICD-10-CM | POA: Diagnosis not present

## 2021-06-29 DIAGNOSIS — E785 Hyperlipidemia, unspecified: Secondary | ICD-10-CM | POA: Diagnosis not present

## 2021-06-29 DIAGNOSIS — I10 Essential (primary) hypertension: Secondary | ICD-10-CM | POA: Diagnosis not present

## 2021-07-11 DIAGNOSIS — I10 Essential (primary) hypertension: Secondary | ICD-10-CM | POA: Diagnosis not present

## 2021-07-11 DIAGNOSIS — E611 Iron deficiency: Secondary | ICD-10-CM | POA: Diagnosis not present

## 2021-07-11 DIAGNOSIS — E039 Hypothyroidism, unspecified: Secondary | ICD-10-CM | POA: Diagnosis not present

## 2021-07-13 ENCOUNTER — Other Ambulatory Visit (HOSPITAL_COMMUNITY): Payer: Self-pay | Admitting: Family Medicine

## 2021-07-13 DIAGNOSIS — E785 Hyperlipidemia, unspecified: Secondary | ICD-10-CM | POA: Diagnosis not present

## 2021-07-13 DIAGNOSIS — Z8679 Personal history of other diseases of the circulatory system: Secondary | ICD-10-CM | POA: Diagnosis not present

## 2021-07-13 DIAGNOSIS — R634 Abnormal weight loss: Secondary | ICD-10-CM

## 2021-07-13 DIAGNOSIS — G8929 Other chronic pain: Secondary | ICD-10-CM | POA: Diagnosis not present

## 2021-07-13 DIAGNOSIS — E611 Iron deficiency: Secondary | ICD-10-CM | POA: Diagnosis not present

## 2021-07-13 DIAGNOSIS — I10 Essential (primary) hypertension: Secondary | ICD-10-CM | POA: Diagnosis not present

## 2021-07-13 DIAGNOSIS — I509 Heart failure, unspecified: Secondary | ICD-10-CM | POA: Diagnosis not present

## 2021-07-13 DIAGNOSIS — E039 Hypothyroidism, unspecified: Secondary | ICD-10-CM | POA: Diagnosis not present

## 2021-07-13 DIAGNOSIS — R531 Weakness: Secondary | ICD-10-CM | POA: Diagnosis not present

## 2021-07-13 DIAGNOSIS — N1832 Chronic kidney disease, stage 3b: Secondary | ICD-10-CM | POA: Diagnosis not present

## 2021-07-13 DIAGNOSIS — R197 Diarrhea, unspecified: Secondary | ICD-10-CM | POA: Diagnosis not present

## 2021-08-01 ENCOUNTER — Encounter (HOSPITAL_COMMUNITY): Payer: Self-pay

## 2021-08-01 ENCOUNTER — Ambulatory Visit (HOSPITAL_COMMUNITY)
Admission: RE | Admit: 2021-08-01 | Discharge: 2021-08-01 | Disposition: A | Discharge status: Home / Self Care | Payer: Medicare Other | Source: Ambulatory Visit | Attending: Family Medicine | Admitting: Family Medicine

## 2021-08-01 ENCOUNTER — Observation Stay (HOSPITAL_COMMUNITY)
Admission: EM | Admit: 2021-08-01 | Discharge: 2021-08-02 | Disposition: A | Discharge status: Home / Self Care | Payer: Medicare Other | Attending: Family Medicine | Admitting: Family Medicine

## 2021-08-01 ENCOUNTER — Other Ambulatory Visit: Payer: Self-pay

## 2021-08-01 DIAGNOSIS — R634 Abnormal weight loss: Secondary | ICD-10-CM | POA: Insufficient documentation

## 2021-08-01 DIAGNOSIS — N134 Hydroureter: Secondary | ICD-10-CM | POA: Diagnosis not present

## 2021-08-01 DIAGNOSIS — R102 Pelvic and perineal pain: Secondary | ICD-10-CM | POA: Diagnosis not present

## 2021-08-01 DIAGNOSIS — R109 Unspecified abdominal pain: Secondary | ICD-10-CM | POA: Diagnosis not present

## 2021-08-01 DIAGNOSIS — Z85828 Personal history of other malignant neoplasm of skin: Secondary | ICD-10-CM | POA: Diagnosis not present

## 2021-08-01 DIAGNOSIS — I130 Hypertensive heart and chronic kidney disease with heart failure and stage 1 through stage 4 chronic kidney disease, or unspecified chronic kidney disease: Secondary | ICD-10-CM | POA: Insufficient documentation

## 2021-08-01 DIAGNOSIS — I482 Chronic atrial fibrillation, unspecified: Secondary | ICD-10-CM | POA: Diagnosis not present

## 2021-08-01 DIAGNOSIS — I5030 Unspecified diastolic (congestive) heart failure: Secondary | ICD-10-CM | POA: Insufficient documentation

## 2021-08-01 DIAGNOSIS — I10 Essential (primary) hypertension: Secondary | ICD-10-CM | POA: Diagnosis not present

## 2021-08-01 DIAGNOSIS — K8689 Other specified diseases of pancreas: Secondary | ICD-10-CM | POA: Diagnosis not present

## 2021-08-01 DIAGNOSIS — N1832 Chronic kidney disease, stage 3b: Secondary | ICD-10-CM | POA: Diagnosis not present

## 2021-08-01 DIAGNOSIS — Z85038 Personal history of other malignant neoplasm of large intestine: Secondary | ICD-10-CM | POA: Diagnosis not present

## 2021-08-01 DIAGNOSIS — N281 Cyst of kidney, acquired: Secondary | ICD-10-CM | POA: Diagnosis not present

## 2021-08-01 DIAGNOSIS — R1900 Intra-abdominal and pelvic swelling, mass and lump, unspecified site: Secondary | ICD-10-CM

## 2021-08-01 DIAGNOSIS — Z79899 Other long term (current) drug therapy: Secondary | ICD-10-CM | POA: Diagnosis not present

## 2021-08-01 LAB — CBC WITH DIFFERENTIAL/PLATELET
Abs Immature Granulocytes: 0.01 10*3/uL (ref 0.00–0.07)
Basophils Absolute: 0 10*3/uL (ref 0.0–0.1)
Basophils Relative: 0 %
Eosinophils Absolute: 0.2 10*3/uL (ref 0.0–0.5)
Eosinophils Relative: 3 %
HCT: 36.1 % (ref 36.0–46.0)
Hemoglobin: 11.5 g/dL — ABNORMAL LOW (ref 12.0–15.0)
Immature Granulocytes: 0 %
Lymphocytes Relative: 15 %
Lymphs Abs: 0.9 10*3/uL (ref 0.7–4.0)
MCH: 29.1 pg (ref 26.0–34.0)
MCHC: 31.9 g/dL (ref 30.0–36.0)
MCV: 91.4 fL (ref 80.0–100.0)
Monocytes Absolute: 0.4 10*3/uL (ref 0.1–1.0)
Monocytes Relative: 7 %
Neutro Abs: 4.9 10*3/uL (ref 1.7–7.7)
Neutrophils Relative %: 75 %
Platelets: 320 10*3/uL (ref 150–400)
RBC: 3.95 MIL/uL (ref 3.87–5.11)
RDW: 14.5 % (ref 11.5–15.5)
WBC: 6.4 10*3/uL (ref 4.0–10.5)
nRBC: 0 % (ref 0.0–0.2)

## 2021-08-01 LAB — COMPREHENSIVE METABOLIC PANEL
ALT: 10 U/L (ref 0–44)
AST: 13 U/L — ABNORMAL LOW (ref 15–41)
Albumin: 3.9 g/dL (ref 3.5–5.0)
Alkaline Phosphatase: 100 U/L (ref 38–126)
Anion gap: 9 (ref 5–15)
BUN: 15 mg/dL (ref 8–23)
CO2: 23 mmol/L (ref 22–32)
Calcium: 9.4 mg/dL (ref 8.9–10.3)
Chloride: 106 mmol/L (ref 98–111)
Creatinine, Ser: 1.01 mg/dL — ABNORMAL HIGH (ref 0.44–1.00)
GFR, Estimated: 53 mL/min — ABNORMAL LOW (ref >60)
Glucose, Bld: 89 mg/dL (ref 70–99)
Potassium: 3.7 mmol/L (ref 3.5–5.1)
Sodium: 138 mmol/L (ref 135–145)
Total Bilirubin: 0.4 mg/dL (ref 0.3–1.2)
Total Protein: 8.4 g/dL — ABNORMAL HIGH (ref 6.5–8.1)

## 2021-08-01 LAB — URINALYSIS, ROUTINE W REFLEX MICROSCOPIC
Bilirubin Urine: NEGATIVE
Glucose, UA: NEGATIVE mg/dL
Hgb urine dipstick: NEGATIVE
Ketones, ur: NEGATIVE mg/dL
Nitrite: NEGATIVE
Protein, ur: NEGATIVE mg/dL
Specific Gravity, Urine: 1.005 (ref 1.005–1.030)
pH: 7 (ref 5.0–8.0)

## 2021-08-01 LAB — LIPASE, BLOOD: Lipase: 18 U/L (ref 11–51)

## 2021-08-01 IMAGING — MR MR PELVIS WO/W CM
21 series · 48 of 48 positions shown · IV contrast (gadavist)
Comparison: [DATE] abdominopelvic CT. [DATE] pelvic
ultrasound.

CLINICAL DATA: Unintentional weight loss. Abdominal pain. EMR
clinical history includes anorectal carcinoma.

EXAM:
MRI ABDOMEN AND PELVIS WITHOUT AND WITH CONTRAST
TECHNIQUE: Multiplanar multisequence MR imaging of the abdomen and pelvis was
performed both before and after the administration of intravenous
contrast.
CONTRAST:  5mL GADAVIST GADOBUTROL 1 MMOL/ML IV SOLN

[Series 5: T2 · coronal · 6.0mm · 1.19mm/px · 1 of 27 slices shown (1 of 5)]
[im 1/27]
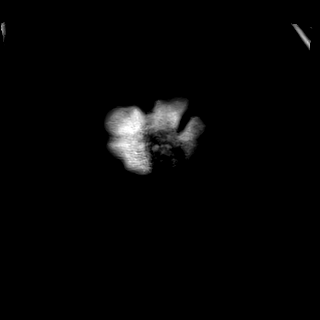

[Series 6: T2 · axial · 6.0mm · 1.19mm/px · 1 of 30 slices shown (2 of 5)]
[im 1/30]
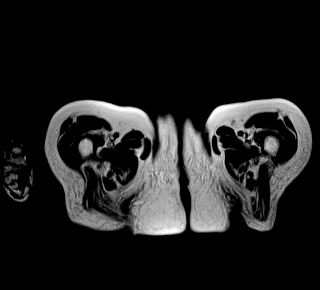

[Series 7: T2 · sagittal · 6.0mm · 1.19mm/px · 1 of 30 slices shown (3 of 5)]
[im 1/30]
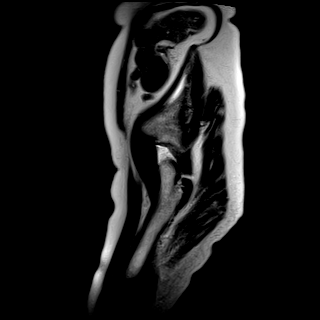

[Series 8: T1 · axial · 3.0mm · 1.19mm/px · z∈[-348,-136]mm · 2 of 72 slices shown (1 of 2)]
[im 1/72]
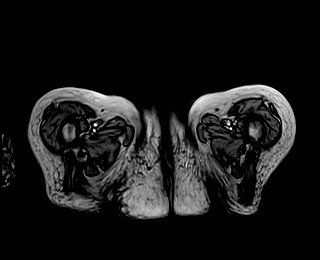
[im 72/72]
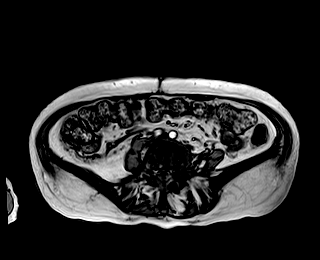

[Series 9: T1 fat-sat · axial · 3.0mm · 1.19mm/px · z∈[-348,-136]mm · 2 of 72 slices shown (1 of 2)]
[im 1/72]
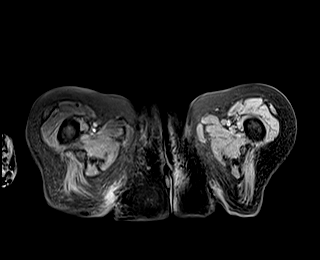
[im 72/72]
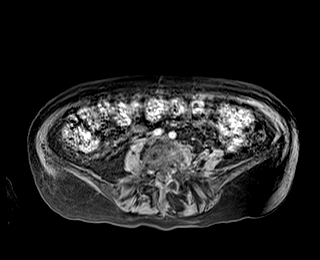

[Series 11: T2 · coronal · 6.0mm · 1.25mm/px · 1 of 30 slices shown (4 of 5)]
[im 1/30]
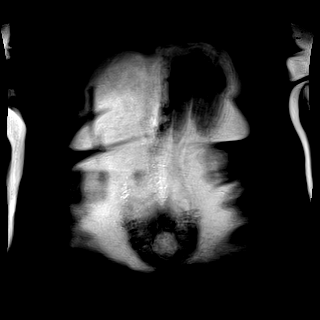

[Series 12: bSSFP · axial · 6.0mm · 0.74mm/px · z∈[-171,+110]mm · 2 of 40 slices shown]
[im 1/40]
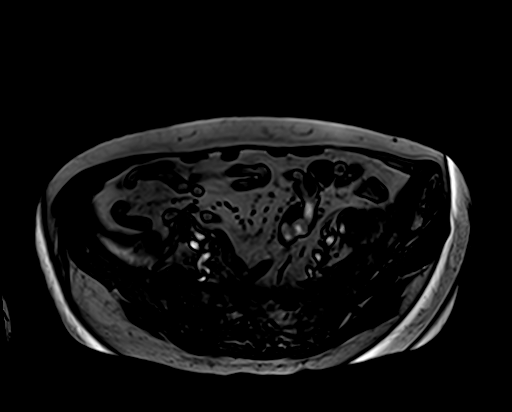
[im 40/40]
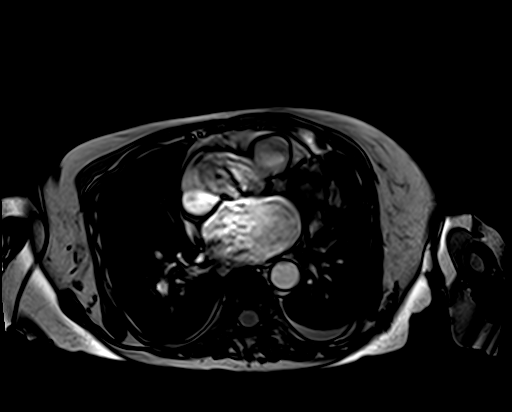

[Series 14: T2 fat-sat · axial · 6.0mm · 1.19mm/px · 1 of 36 slices shown]
[im 1/36]
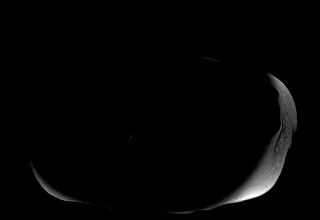

[Series 15: T1 · axial · 6.0mm · 0.74mm/px · z∈[-123,+86]mm · 2 of 60 slices shown (2 of 2)]
[im 1/60]
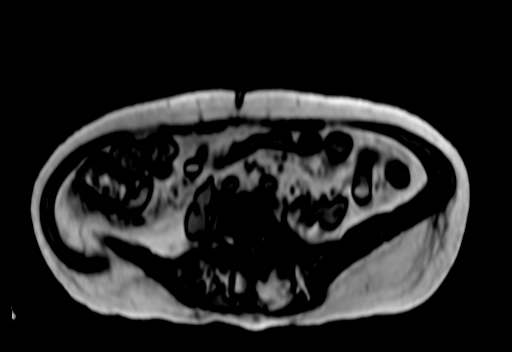
[im 60/60]
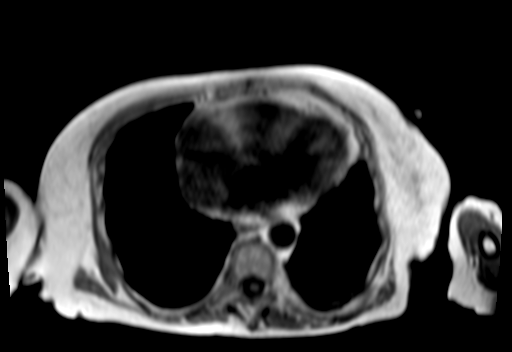

[Series 16: T2 · axial · 6.0mm · 1.19mm/px · z∈[-171,+110]mm · 2 of 40 slices shown (5 of 5)]
[im 1/40]
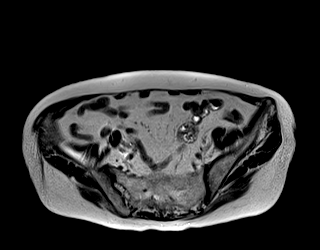
[im 40/40]
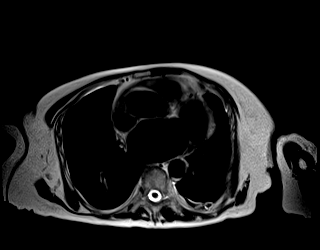

[Series 17: T1 dynamic fat-sat · axial · non-contrast · 3.5mm · 1.19mm/px · z∈[-129,+92]mm · 3 of 64 slices shown]
[im 1/64]
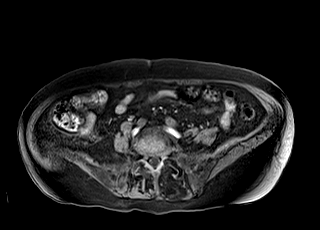
[im 32/64]
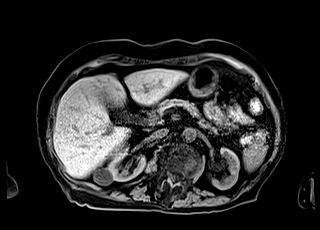
[im 64/64]
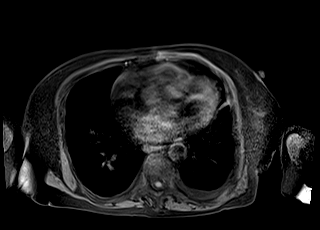

[Series 18: T1 dynamic fat-sat post-contrast · axial · 3.5mm · 1.19mm/px · z∈[-129,+92]mm · 3 of 64 slices shown (1 of 8)]
[im 1/64]
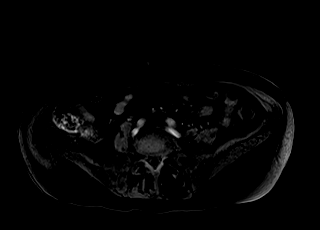
[im 32/64]
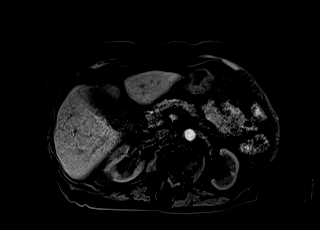
[im 64/64]
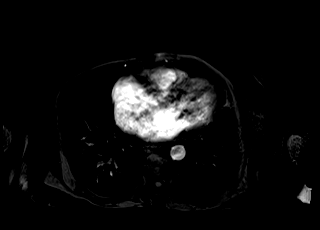

[Series 19: T1 dynamic fat-sat post-contrast · axial · 3.5mm · 1.19mm/px · z∈[-129,+92]mm · 3 of 64 slices shown (2 of 8)]
[im 1/64]
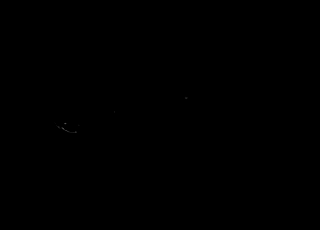
[im 32/64]
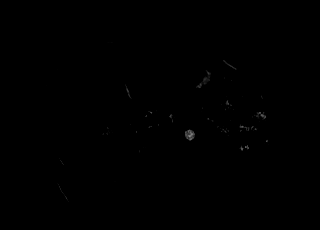
[im 64/64]
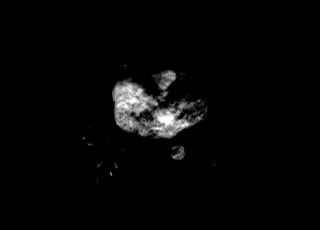

[Series 20: T1 dynamic fat-sat post-contrast · axial · 3.5mm · 1.19mm/px · z∈[-129,+92]mm · 3 of 64 slices shown (3 of 8)]
[im 1/64]
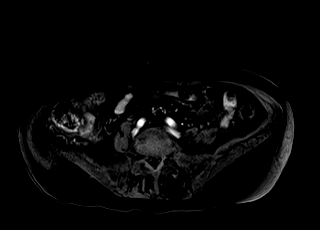
[im 32/64]
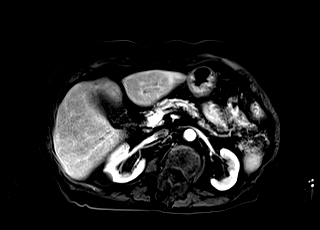
[im 64/64]
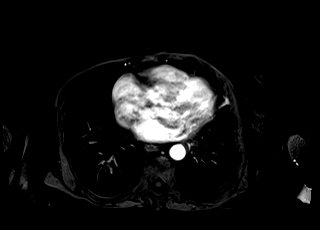

[Series 21: T1 dynamic fat-sat post-contrast · axial · 3.5mm · 1.19mm/px · z∈[-129,+92]mm · 3 of 64 slices shown (4 of 8)]
[im 1/64]
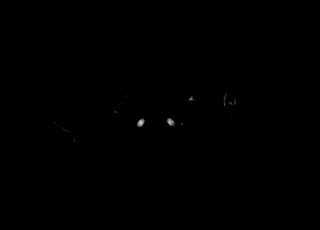
[im 32/64]
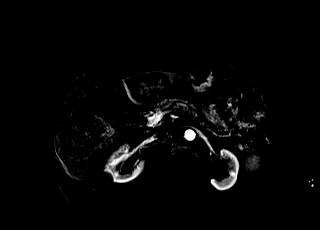
[im 64/64]
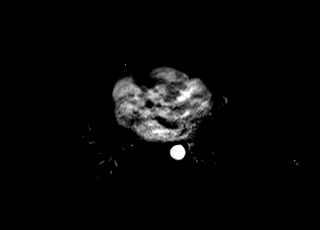

[Series 22: T1 dynamic fat-sat post-contrast · axial · 3.5mm · 1.19mm/px · z∈[-129,+92]mm · 3 of 64 slices shown (5 of 8)]
[im 1/64]
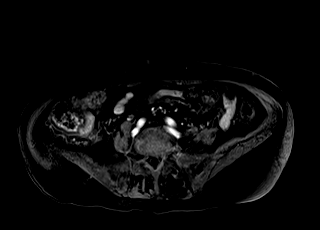
[im 32/64]
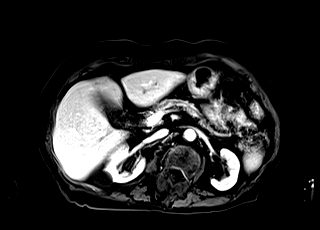
[im 64/64]
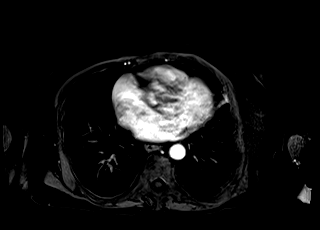

[Series 23: T1 dynamic fat-sat post-contrast · axial · 3.5mm · 1.19mm/px · z∈[-129,+92]mm · 3 of 64 slices shown (6 of 8)]
[im 1/64]
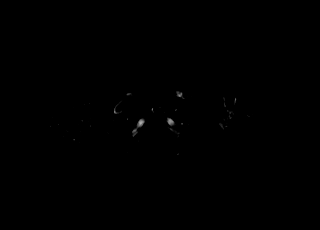
[im 32/64]
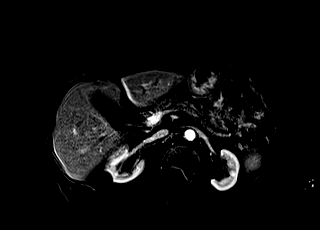
[im 64/64]
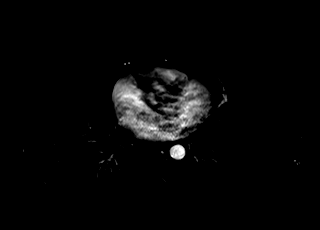

[Series 24: T1 dynamic post-contrast · coronal · 3.0mm · 1.31mm/px · 3 of 72 slices shown]
[im 1/72]
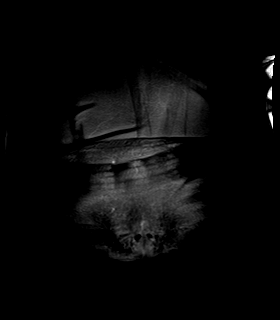
[im 36/72]
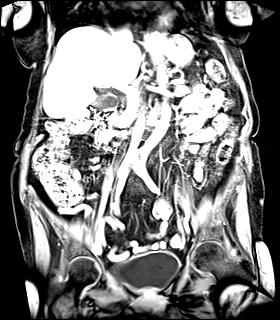
[im 72/72]
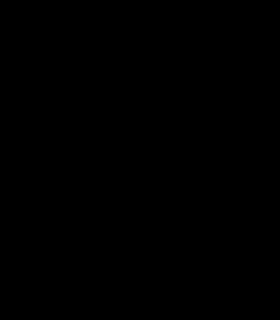

[Series 25: T1 dynamic fat-sat post-contrast · axial · 3.5mm · 1.19mm/px · z∈[-129,+92]mm · 3 of 64 slices shown (7 of 8)]
[im 1/64]
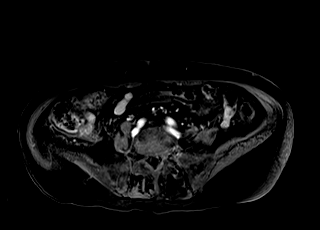
[im 32/64]
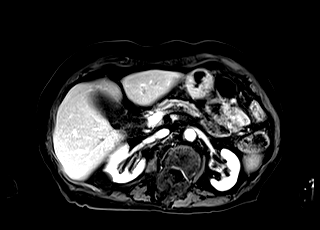
[im 64/64]
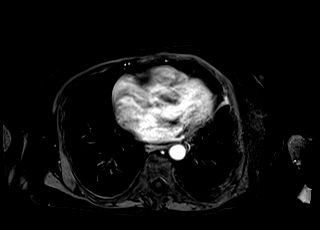

[Series 26: T1 dynamic fat-sat post-contrast · axial · 3.5mm · 1.19mm/px · z∈[-129,+92]mm · 3 of 64 slices shown (8 of 8)]
[im 1/64]
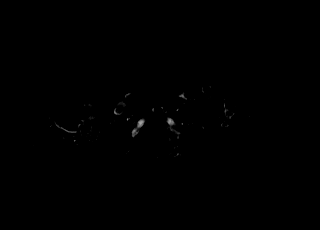
[im 32/64]
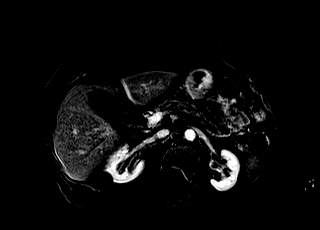
[im 64/64]
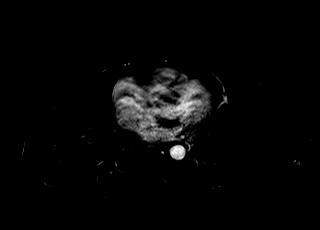

[Series 27: T1 fat-sat · axial · 3.0mm · 1.19mm/px · z∈[-348,-136]mm · 3 of 72 slices shown (2 of 2)]
[im 1/72]
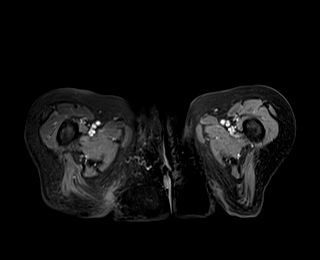
[im 36/72]
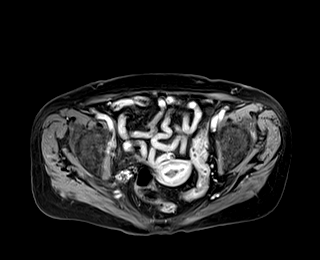
[im 72/72]
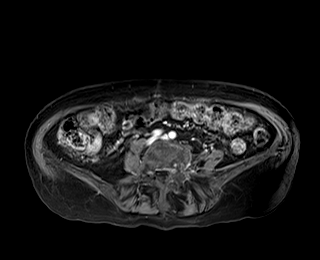

[48 of 48 positions shown; findings below may reference images not displayed]

FINDINGS: COMBINED FINDINGS FOR BOTH MR ABDOMEN AND PELVIS

Lower chest: Small left pleural effusion is new since [DATE].
Mild cardiomegaly.

Hepatobiliary: No focal liver lesion. Normal gallbladder. Mild
intrahepatic biliary duct dilatation has been present back to
[DATE] CT, possibly within normal variation. Normal common duct
caliber for age, without choledocholithiasis.

Pancreas: The pancreas is atrophic and the duct is borderline
dilated. This is progressive when compared back to [DATE]. Duct
is followed to the level of the ampulla, without obstructive stone
or mass. No acute inflammation.

Spleen: Posterior splenic T2 hyperintense hyperenhancing lesions are
likely hemangiomas including at 8 mm on 25/21. No splenomegaly.

Adrenals/Urinary Tract: Normal adrenal glands. Interpolar right
renal cyst. Mild renal cortical thinning bilaterally. Bilateral too
small to characterize renal lesions. Minimal prominence of the right
proximal ureter including on series 11. No cause identified. Normal
urinary bladder.

Stomach/Bowel: Proximal gastric underdistention. The previously
described colitis has resolved. Colonic stool burden suggests
constipation. Normal small bowel.

Vascular/Lymphatic: Aortic atherosclerosis. No abdominal adenopathy.

The right pelvic sidewall lesion posterior to the external iliac
vessels measures 1.9 x 1.8 cm on 27/28. Similar to 1.8 x 1.7 cm on
the [DATE] CT (when remeasured). New since remote CTs.

Contiguous or adjacent more caudal right external iliac enhancing
nodule or node at 1.1 cm on 34/28, new.

More posterior right pelvic sidewall lesion (which may be adnexal)
measures 3.3 x 2.2 cm on 32/28 versus 2.5 x 2.1 cm on the prior CT
(when remeasured).

Reproductive: Normal uterus for age.  No left ovarian/adnexal mass.

Other: No significant free fluid. Pelvic floor laxity. No evidence
of omental or peritoneal disease.

Musculoskeletal: Convex left lumbar spine curvature.
IMPRESSION: 1. Right pelvic lesions which are new and significantly enlarged
when compared to remote CTs including [DATE]. In this patient
with a history of anorectal carcinoma, these are highly suspicious
for nodal and possibly adnexal metastasis. Metachronous primary felt
less likely.
2. No abdominal metastatic disease or acute process.
3. New small left pleural effusion since [DATE].
[DATE]. Chronic intrahepatic biliary duct dilatation, which could be
correlated with bilirubin levels.
5.  Possible constipation.
6. Minimal similar right proximal hydroureter, without cause
identified.

## 2021-08-01 IMAGING — MR MR ABDOMEN WO/W CM
21 series · 48 of 48 positions shown · IV contrast (gadavist)
Comparison: [DATE] abdominopelvic CT. [DATE] pelvic
ultrasound.

CLINICAL DATA: Unintentional weight loss. Abdominal pain. EMR
clinical history includes anorectal carcinoma.

EXAM:
MRI ABDOMEN AND PELVIS WITHOUT AND WITH CONTRAST
TECHNIQUE: Multiplanar multisequence MR imaging of the abdomen and pelvis was
performed both before and after the administration of intravenous
contrast.
CONTRAST:  5mL GADAVIST GADOBUTROL 1 MMOL/ML IV SOLN

[Series 5: T2 · coronal · 6.0mm · 1.19mm/px · 1 of 27 slices shown (1 of 5)]
[im 1/27]
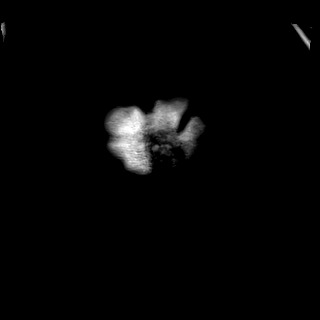

[Series 6: T2 · axial · 6.0mm · 1.19mm/px · 1 of 30 slices shown (2 of 5)]
[im 1/30]
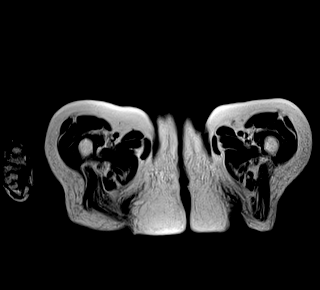

[Series 7: T2 · sagittal · 6.0mm · 1.19mm/px · 1 of 30 slices shown (3 of 5)]
[im 1/30]
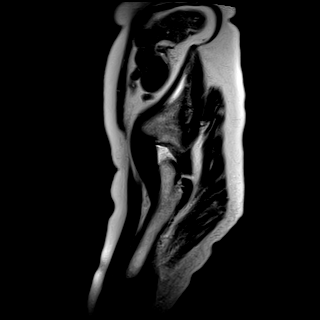

[Series 8: T1 · axial · 3.0mm · 1.19mm/px · z∈[-348,-136]mm · 2 of 72 slices shown (1 of 2)]
[im 1/72]
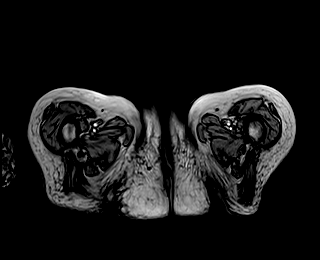
[im 72/72]
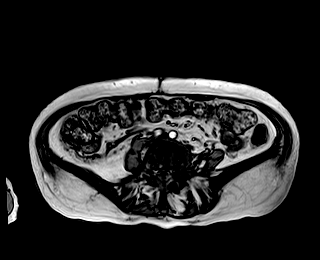

[Series 9: T1 fat-sat · axial · 3.0mm · 1.19mm/px · z∈[-348,-136]mm · 2 of 72 slices shown (1 of 2)]
[im 1/72]
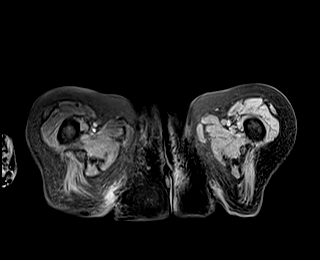
[im 72/72]
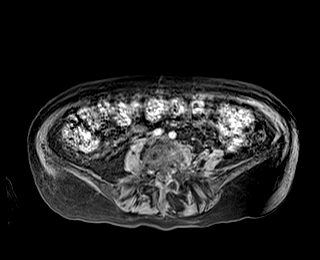

[Series 11: T2 · coronal · 6.0mm · 1.25mm/px · 1 of 30 slices shown (4 of 5)]
[im 1/30]
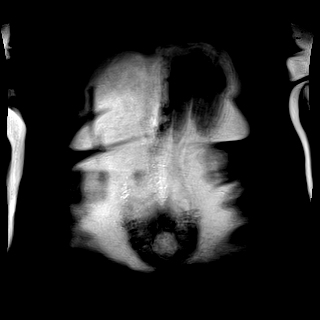

[Series 12: bSSFP · axial · 6.0mm · 0.74mm/px · z∈[-171,+110]mm · 2 of 40 slices shown]
[im 1/40]
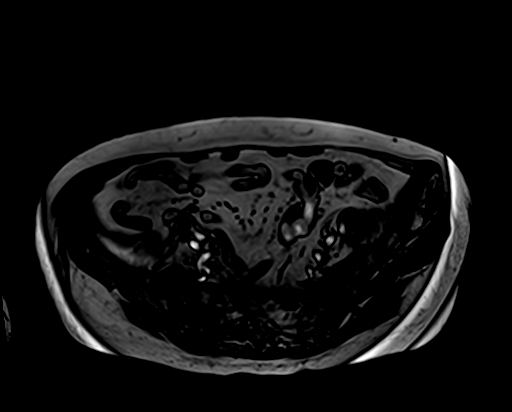
[im 40/40]
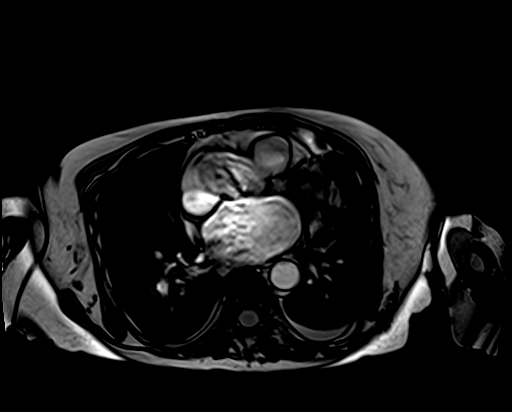

[Series 15: T2 fat-sat · axial · 6.0mm · 1.19mm/px · 1 of 36 slices shown]
[im 1/36]
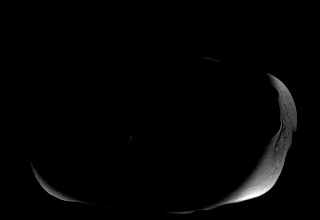

[Series 16: T1 · axial · 6.0mm · 0.74mm/px · z∈[-123,+86]mm · 2 of 60 slices shown (2 of 2)]
[im 1/60]
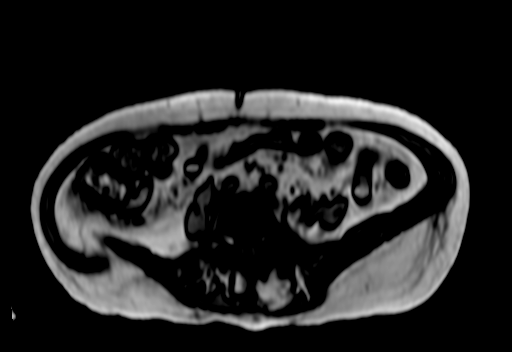
[im 60/60]
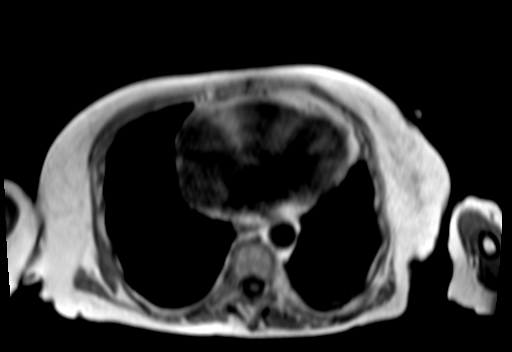

[Series 17: T2 · axial · 6.0mm · 1.19mm/px · z∈[-171,+110]mm · 2 of 40 slices shown (5 of 5)]
[im 1/40]
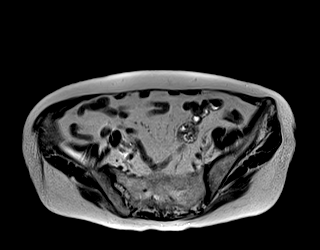
[im 40/40]
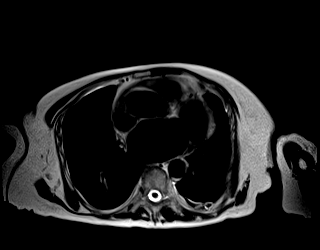

[Series 18: T1 dynamic fat-sat · axial · non-contrast · 3.5mm · 1.19mm/px · z∈[-129,+92]mm · 3 of 64 slices shown]
[im 1/64]
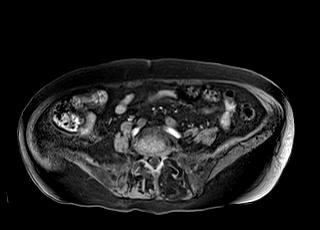
[im 32/64]
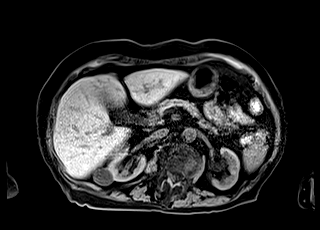
[im 64/64]
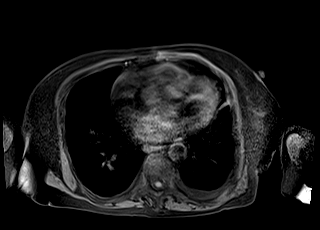

[Series 19: T1 dynamic fat-sat post-contrast · axial · 3.5mm · 1.19mm/px · z∈[-129,+92]mm · 3 of 64 slices shown (1 of 8)]
[im 1/64]
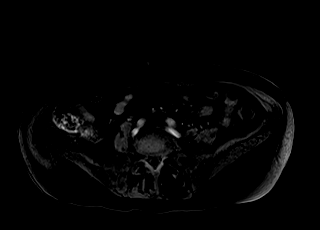
[im 32/64]
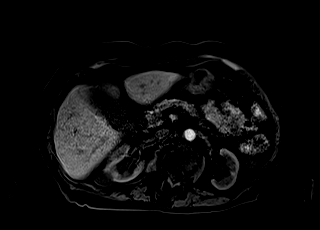
[im 64/64]
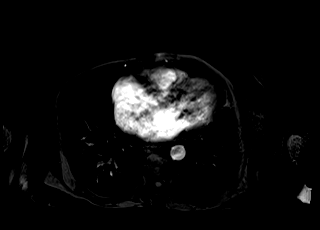

[Series 20: T1 dynamic fat-sat post-contrast · axial · 3.5mm · 1.19mm/px · z∈[-129,+92]mm · 3 of 64 slices shown (2 of 8)]
[im 1/64]
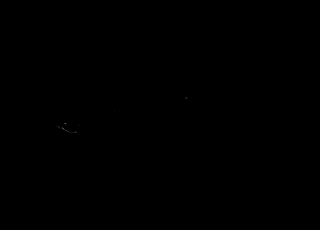
[im 32/64]
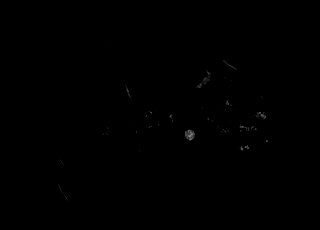
[im 64/64]
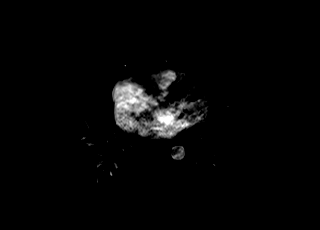

[Series 21: T1 dynamic fat-sat post-contrast · axial · 3.5mm · 1.19mm/px · z∈[-129,+92]mm · 3 of 64 slices shown (3 of 8)]
[im 1/64]
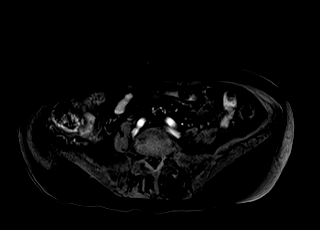
[im 32/64]
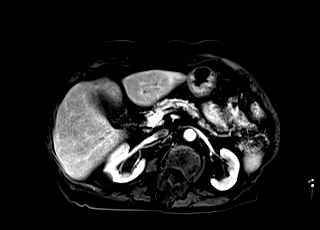
[im 64/64]
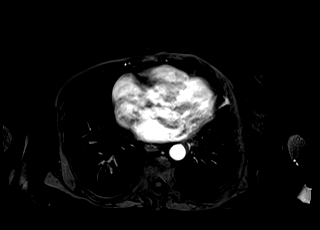

[Series 22: T1 dynamic fat-sat post-contrast · axial · 3.5mm · 1.19mm/px · z∈[-129,+92]mm · 3 of 64 slices shown (4 of 8)]
[im 1/64]
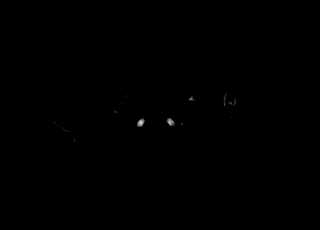
[im 32/64]
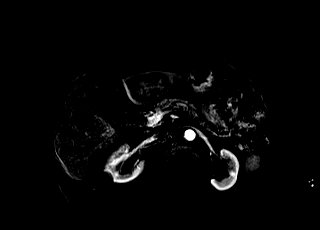
[im 64/64]
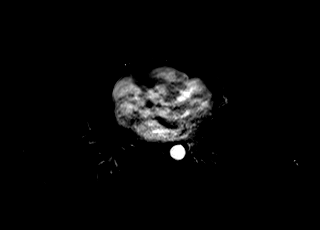

[Series 23: T1 dynamic fat-sat post-contrast · axial · 3.5mm · 1.19mm/px · z∈[-129,+92]mm · 3 of 64 slices shown (5 of 8)]
[im 1/64]
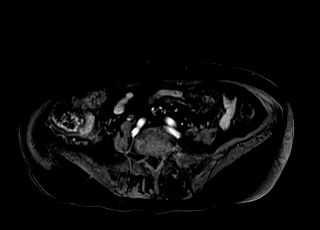
[im 32/64]
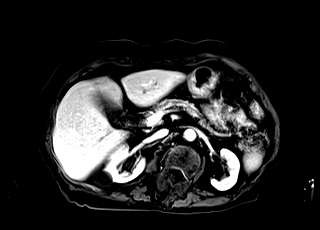
[im 64/64]
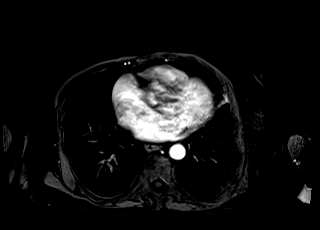

[Series 24: T1 dynamic fat-sat post-contrast · axial · 3.5mm · 1.19mm/px · z∈[-129,+92]mm · 3 of 64 slices shown (6 of 8)]
[im 1/64]
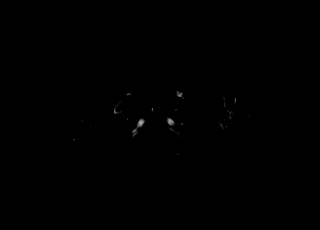
[im 32/64]
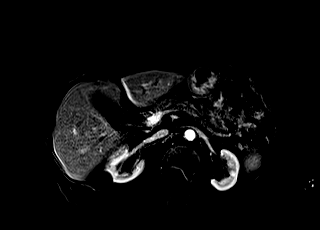
[im 64/64]
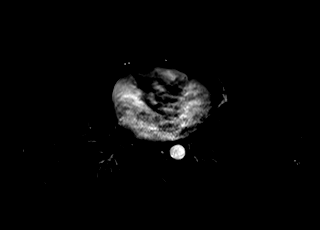

[Series 25: T1 dynamic post-contrast · coronal · 3.0mm · 1.31mm/px · 3 of 72 slices shown]
[im 1/72]
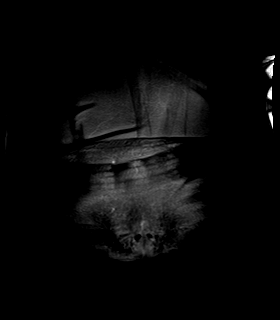
[im 36/72]
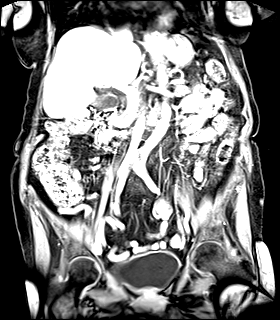
[im 72/72]
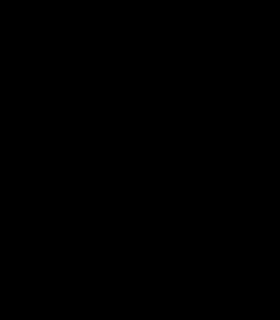

[Series 26: T1 dynamic fat-sat post-contrast · axial · 3.5mm · 1.19mm/px · z∈[-129,+92]mm · 3 of 64 slices shown (7 of 8)]
[im 1/64]
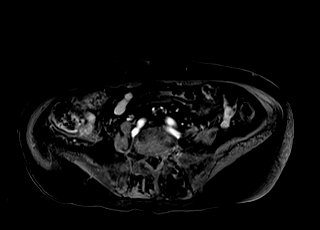
[im 32/64]
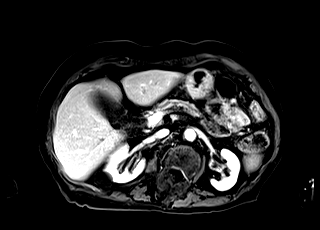
[im 64/64]
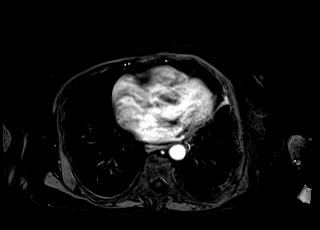

[Series 27: T1 dynamic fat-sat post-contrast · axial · 3.5mm · 1.19mm/px · z∈[-129,+92]mm · 3 of 64 slices shown (8 of 8)]
[im 1/64]
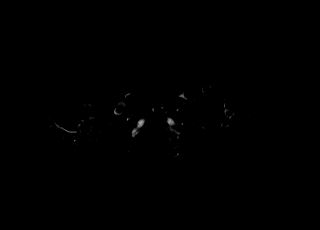
[im 32/64]
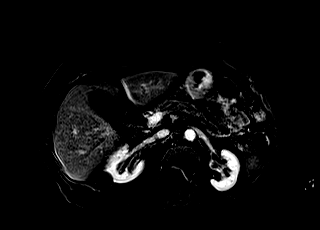
[im 64/64]
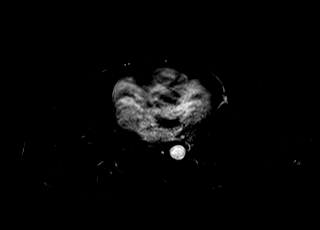

[Series 28: T1 fat-sat · axial · 3.0mm · 1.19mm/px · z∈[-348,-136]mm · 3 of 72 slices shown (2 of 2)]
[im 1/72]
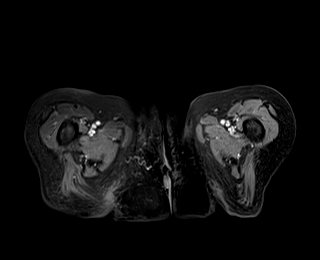
[im 36/72]
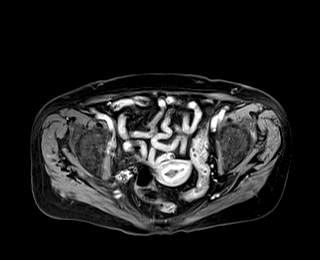
[im 72/72]
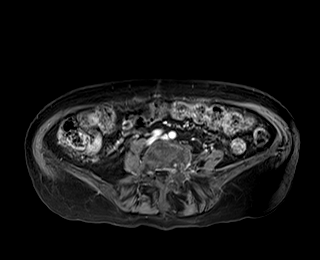

[48 of 48 positions shown; findings below may reference images not displayed]

FINDINGS: COMBINED FINDINGS FOR BOTH MR ABDOMEN AND PELVIS

Lower chest: Small left pleural effusion is new since [DATE].
Mild cardiomegaly.

Hepatobiliary: No focal liver lesion. Normal gallbladder. Mild
intrahepatic biliary duct dilatation has been present back to
[DATE] CT, possibly within normal variation. Normal common duct
caliber for age, without choledocholithiasis.

Pancreas: The pancreas is atrophic and the duct is borderline
dilated. This is progressive when compared back to [DATE]. Duct
is followed to the level of the ampulla, without obstructive stone
or mass. No acute inflammation.

Spleen: Posterior splenic T2 hyperintense hyperenhancing lesions are
likely hemangiomas including at 8 mm on 25/21. No splenomegaly.

Adrenals/Urinary Tract: Normal adrenal glands. Interpolar right
renal cyst. Mild renal cortical thinning bilaterally. Bilateral too
small to characterize renal lesions. Minimal prominence of the right
proximal ureter including on series 11. No cause identified. Normal
urinary bladder.

Stomach/Bowel: Proximal gastric underdistention. The previously
described colitis has resolved. Colonic stool burden suggests
constipation. Normal small bowel.

Vascular/Lymphatic: Aortic atherosclerosis. No abdominal adenopathy.

The right pelvic sidewall lesion posterior to the external iliac
vessels measures 1.9 x 1.8 cm on 27/28. Similar to 1.8 x 1.7 cm on
the [DATE] CT (when remeasured). New since remote CTs.

Contiguous or adjacent more caudal right external iliac enhancing
nodule or node at 1.1 cm on 34/28, new.

More posterior right pelvic sidewall lesion (which may be adnexal)
measures 3.3 x 2.2 cm on 32/28 versus 2.5 x 2.1 cm on the prior CT
(when remeasured).

Reproductive: Normal uterus for age.  No left ovarian/adnexal mass.

Other: No significant free fluid. Pelvic floor laxity. No evidence
of omental or peritoneal disease.

Musculoskeletal: Convex left lumbar spine curvature.
IMPRESSION: 1. Right pelvic lesions which are new and significantly enlarged
when compared to remote CTs including [DATE]. In this patient
with a history of anorectal carcinoma, these are highly suspicious
for nodal and possibly adnexal metastasis. Metachronous primary felt
less likely.
2. No abdominal metastatic disease or acute process.
3. New small left pleural effusion since [DATE].
[DATE]. Chronic intrahepatic biliary duct dilatation, which could be
correlated with bilirubin levels.
5.  Possible constipation.
6. Minimal similar right proximal hydroureter, without cause
identified.

## 2021-08-01 MED ORDER — LEVOTHYROXINE SODIUM 50 MCG PO TABS: 50.0000 ug | ORAL_TABLET | Freq: Every day | ORAL | Status: DC

## 2021-08-01 MED ORDER — MORPHINE SULFATE (PF) 4 MG/ML IV SOLN
4.0000 mg | Freq: Once | INTRAVENOUS | Status: AC
  Administered 2021-08-01: 4 mg via INTRAVENOUS
  Filled 2021-08-01: qty 1

## 2021-08-01 MED ORDER — HYDROMORPHONE HCL 1 MG/ML IJ SOLN
0.5000 mg | Freq: Once | INTRAMUSCULAR | Status: AC
  Administered 2021-08-01: 0.5 mg via INTRAVENOUS
  Filled 2021-08-01: qty 0.5

## 2021-08-01 MED ORDER — GADOBUTROL 1 MMOL/ML IV SOLN
5.0000 mL | Freq: Once | INTRAVENOUS | Status: AC | PRN
  Administered 2021-08-01: 5 mL via INTRAVENOUS

## 2021-08-01 MED ORDER — ONDANSETRON HCL 4 MG/2ML IJ SOLN
4.0000 mg | Freq: Once | INTRAMUSCULAR | Status: AC
  Administered 2021-08-01: 4 mg via INTRAVENOUS
  Filled 2021-08-01: qty 2

## 2021-08-01 MED ORDER — HYDRALAZINE HCL 20 MG/ML IJ SOLN: 5.0000 mg | INTRAMUSCULAR | Status: DC | PRN

## 2021-08-01 MED ORDER — ALPRAZOLAM 0.25 MG PO TABS: 0.2500 mg | ORAL_TABLET | Freq: Two times a day (BID) | ORAL | Status: DC | PRN

## 2021-08-01 MED ORDER — ENOXAPARIN SODIUM 30 MG/0.3ML IJ SOSY
30.0000 mg | PREFILLED_SYRINGE | Freq: Every day | INTRAMUSCULAR | Status: DC
  Administered 2021-08-01: 30 mg via SUBCUTANEOUS
  Filled 2021-08-01: qty 0.3

## 2021-08-01 MED ORDER — ALPRAZOLAM 0.5 MG PO TABS
0.5000 mg | ORAL_TABLET | Freq: Every day | ORAL | Status: DC
  Administered 2021-08-01: 0.5 mg via ORAL
  Filled 2021-08-01: qty 1

## 2021-08-01 MED ORDER — ONDANSETRON HCL 4 MG/2ML IJ SOLN: 4.0000 mg | Freq: Four times a day (QID) | INTRAMUSCULAR | Status: DC | PRN

## 2021-08-01 MED ORDER — SODIUM CHLORIDE 0.9 % IV BOLUS
1000.0000 mL | Freq: Once | INTRAVENOUS | Status: AC
  Administered 2021-08-01: 1000 mL via INTRAVENOUS

## 2021-08-01 MED ORDER — DILTIAZEM HCL ER 90 MG PO CP12
90.0000 mg | ORAL_CAPSULE | Freq: Two times a day (BID) | ORAL | Status: DC
  Administered 2021-08-01 – 2021-08-02 (×3): 90 mg via ORAL
  Filled 2021-08-01 (×5): qty 1

## 2021-08-01 MED ORDER — SODIUM CHLORIDE 0.9 % IV BOLUS
500.0000 mL | Freq: Once | INTRAVENOUS | Status: AC
  Administered 2021-08-01: 500 mL via INTRAVENOUS

## 2021-08-01 MED ORDER — METOPROLOL SUCCINATE ER 25 MG PO TB24
25.0000 mg | ORAL_TABLET | Freq: Every day | ORAL | Status: DC
  Administered 2021-08-01 – 2021-08-02 (×2): 25 mg via ORAL
  Filled 2021-08-01 (×2): qty 1

## 2021-08-01 MED ORDER — HYDROMORPHONE HCL 1 MG/ML IJ SOLN: 1.0000 mg | INTRAMUSCULAR | Status: DC | PRN

## 2021-08-01 MED ORDER — OXYCODONE-ACETAMINOPHEN 5-325 MG PO TABS
1.0000 | ORAL_TABLET | ORAL | Status: DC | PRN
  Administered 2021-08-01: 2 via ORAL
  Administered 2021-08-02: 1 via ORAL
  Filled 2021-08-01: qty 1
  Filled 2021-08-01: qty 2

## 2021-08-01 MED ORDER — SODIUM CHLORIDE 0.9% FLUSH
3.0000 mL | Freq: Two times a day (BID) | INTRAVENOUS | Status: DC
  Administered 2021-08-01 – 2021-08-02 (×2): 3 mL via INTRAVENOUS

## 2021-08-01 NOTE — ED Notes (Signed)
Ambulated pt to the bathroom tolerated well ?

## 2021-08-01 NOTE — ED Provider Notes (Signed)
?[facility] ?Provider Note ? ? ?CSN: [id number] ?Arrival date & time: 08/01/21  2725 ? ?  ? ?History ? ?Chief Complaint  ?Patient presents with  ? Abdominal Pain  ? ? ?Renee Blake is a 88 y.o. female with a history of chronic renal failure, chronic atrial fibrillation, hypertension, history of CHF, type 2 diabetes, also history significant for skin and rectal cancer treated with radiation presenting for evaluation of persistent pelvic pain which has been present for several months.  She is followed by Dr. Laural Golden for GI complaints.  She had an evaluation here in the emergency department in January with complaints of vomiting, diarrhea at that time without pain, however CT scan was revealing for possible right adnexal mass.  She was referred for close outpatient follow-up, pelvic ultrasound was negative, returns today for an outpatient MRI of her abdomen and pelvis, woke today with severe abdominal pain prompting her to to check into the emergency department.  She and daughters at bedside endorse she has been having escalating pain mid lower abdomen along with reduced appetite and reporting a 6 pound weight loss since her symptoms began.  She denies meals triggering pain, just reports poor appetite.  She has had no nausea or vomiting, no fevers or chills.  Denies rectal bleeding.  She is taking Tylenol and tramadol but these medications are not controlling her pain, stating she is having difficulty sleeping at night due to her symptoms. ? ?The history is provided by the patient.  ? ?  ? ?Home Medications ?Prior to Admission medications   ?Medication Sig Start Date End Date Taking? Authorizing Provider  ?ALPRAZolam (XANAX) 0.25 MG tablet Take 1 tablet (0.25 mg total) by mouth 2 (two) times daily as needed. for anxiety 04/20/19  Yes Manuella Ghazi, Pratik D, DO  ?ALPRAZolam (XANAX) 0.5 MG tablet Take 0.5 mg by mouth at bedtime. 10/11/20  Yes [provider]  ?diltiazem (CARDIZEM SR) 90 MG 12 hr  capsule Take 1 capsule (90 mg total) by mouth 2 (two) times daily. 05/17/20  Yes Verta Ellen., NP  ?diphenoxylate-atropine (LOMOTIL) 2.5-0.025 MG tablet Take 1 tablet by mouth 4 (four) times daily as needed for diarrhea or loose stools. 04/06/21  Yes Nevena Rozenberg, Almyra Free, PA-C  ?levothyroxine (SYNTHROID) 50 MCG tablet Take 50 mcg by mouth daily. 07/11/20  Yes [provider]  ?loperamide (IMODIUM A-D) 2 MG tablet Take 4 mg by mouth daily as needed for diarrhea or loose stools.   Yes [provider]  ?metoprolol succinate (TOPROL-XL) 25 MG 24 hr tablet Take 1 tablet (25 mg total) by mouth daily. 05/17/20 08/01/21 Yes Verta Ellen., NP  ?ondansetron (ZOFRAN) 4 MG tablet Take 1 tablet (4 mg total) by mouth every 8 (eight) hours as needed for nausea or vomiting. 03/27/20  Yes Hayden Rasmussen, MD  ?potassium chloride SA (KLOR-CON) 20 MEQ tablet Take 1 tablet (20 mEq total) by mouth daily. ?Patient taking differently: Take 20 mEq by mouth every other day. 05/17/20  Yes Verta Ellen., NP  ?Probiotic Product (PROBIOTIC DAILY PO) Take 1 tablet by mouth daily. One daily. Not sure of name but has to be refrigerated.   Yes [provider]  ?torsemide (DEMADEX) 20 MG tablet Take 2 tablets (40 mg total) by mouth every morning. 03/28/21  Yes Donato Heinz, MD  ?traMADol (ULTRAM) 50 MG tablet Take 50 mg by mouth every 6 (six) hours as needed for moderate pain. 06/29/20  Yes [provider]  ?acetaminophen (TYLENOL) 500 MG tablet Take 1,000 mg by mouth every 6 (six) hours as needed for mild pain, moderate pain, fever or headache.    [provider]  ?   ? ?Allergies    ?Clonazepam, Codeine, and Sulfa antibiotics   ? ?Review of Systems   ?Review of Systems  ?Constitutional:  Positive for appetite change and unexpected weight change. Negative for chills and fever.  ?HENT:  Negative for congestion and sore throat.   ?Eyes: Negative.   ?Respiratory:  Negative for chest tightness  and shortness of breath.   ?Cardiovascular:  Negative for chest pain.  ?Gastrointestinal:  Positive for abdominal pain. Negative for blood in stool, diarrhea, nausea and vomiting.  ?Genitourinary: Negative.   ?Musculoskeletal:  Negative for arthralgias, joint swelling and neck pain.  ?Skin: Negative.  Negative for rash and wound.  ?Neurological:  Negative for dizziness, weakness, light-headedness, numbness and headaches.  ?Psychiatric/Behavioral: Negative.    ? ?Physical Exam ?Updated Vital Signs ?BP (!) 177/105   Pulse (!) 112   Temp 97.8 ?F (36.6 ?C) (Temporal)   Resp 15   Ht '5\' 6"'$  (1.676 m)   Wt 49.4 kg   SpO2 98%   BMI 17.59 kg/m?  ?Physical Exam ?Vitals and nursing note reviewed.  ?Constitutional:   ?   Appearance: She is well-developed. She is cachectic.  ?HENT:  ?   Head: Normocephalic and atraumatic.  ?Eyes:  ?   Conjunctiva/sclera: Conjunctivae normal.  ?Cardiovascular:  ?   Rate and Rhythm: Normal rate and regular rhythm.  ?   Heart sounds: Normal heart sounds.  ?Pulmonary:  ?   Effort: Pulmonary effort is normal.  ?   Breath sounds: Normal breath sounds. No wheezing.  ?Abdominal:  ?   General: Bowel sounds are normal.  ?   Palpations: Abdomen is soft.  ?   Tenderness: There is abdominal tenderness in the right lower quadrant and suprapubic area. There is no guarding or rebound.  ?Musculoskeletal:     ?   General: Normal range of motion.  ?   Cervical back: Normal range of motion.  ?Skin: ?   General: Skin is warm and dry.  ?Neurological:  ?   Mental Status: She is alert.  ? ? ?ED Results / Procedures / Treatments   ?Labs ?(all labs ordered are listed, but only abnormal results are displayed) ?Labs Reviewed  ?COMPREHENSIVE METABOLIC PANEL - Abnormal; Notable for the following components:  ?    Result Value  ? Creatinine, Ser 1.01 (*)   ? Total Protein 8.4 (*)   ? AST 13 (*)   ? GFR, Estimated 53 (*)   ? All other components within normal limits  ?URINALYSIS, ROUTINE W REFLEX MICROSCOPIC - Abnormal;  Notable for the following components:  ? Color, Urine STRAW (*)   ? Leukocytes,Ua SMALL (*)   ? Bacteria, UA RARE (*)   ? All other components within normal limits  ?CBC WITH DIFFERENTIAL/PLATELET - Abnormal; Notable for the following components:  ? Hemoglobin 11.5 (*)   ? All other components within normal limits  ?LIPASE, BLOOD  ? ? ?EKG ?EKG Interpretation ? ?Date/Time:  Wednesday Aug 01 2021 11:01:48 EDT ?Ventricular Rate:  97 ?PR Interval:    ?QRS Duration: 128 ?QT Interval:  395 ?QTC Calculation: 502 ?R Axis:   -5 ?Text Interpretation: Atrial fibrillation Right bundle branch block Anteroseptal infarct, age indeterminate Baseline wander in lead(s) V4 No significant change since last tracing Confirmed by Fredia Sorrow [phone])  on 08/01/2021 11:20:32 AM ? ?Radiology ?MR PELVIS W WO CONTRAST ? ?Result Date: 08/01/2021 ?CLINICAL DATA:  Unintentional weight loss. Abdominal pain. EMR clinical history includes anorectal carcinoma. EXAM: MRI ABDOMEN AND PELVIS WITHOUT AND WITH CONTRAST TECHNIQUE: Multiplanar multisequence MR imaging of the abdomen and pelvis was performed both before and after the administration of intravenous contrast. CONTRAST:  88m GADAVIST GADOBUTROL 1 MMOL/ML IV SOLN COMPARISON:  04/06/2021 abdominopelvic CT. 04/13/2021 pelvic ultrasound. FINDINGS: COMBINED FINDINGS FOR BOTH MR ABDOMEN AND PELVIS Lower chest: Small left pleural effusion is new since 04/06/2021. Mild cardiomegaly. Hepatobiliary: No focal liver lesion. Normal gallbladder. Mild intrahepatic biliary duct dilatation has been present back to 07/10/2017 CT, possibly within normal variation. Normal common duct caliber for age, without choledocholithiasis. Pancreas: The pancreas is atrophic and the duct is borderline dilated. This is progressive when compared back to 07/10/2017. Duct is followed to the level of the ampulla, without obstructive stone or mass. No acute inflammation. Spleen: Posterior splenic T2 hyperintense hyperenhancing  lesions are likely hemangiomas including at 8 mm on 25/21. No splenomegaly. Adrenals/Urinary Tract: Normal adrenal glands. Interpolar right renal cyst. Mild renal cortical thinning bilaterally. Bilateral too small t

## 2021-08-01 NOTE — ED Notes (Signed)
Pt ambulated to bathroom tolerated well

## 2021-08-01 NOTE — ED Triage Notes (Signed)
Patient with complaints of on going abdominal pain that became worse this morning while getting a MRI.  ?

## 2021-08-01 NOTE — H&P (Signed)
?History and Physical  ? ? ?Patient: Renee Blake WVP:[id number] DOB: [birth year] ?DOA: 08/01/2021 ?DOS: the patient was seen and examined on 08/01/2021 ?PCP: [name], MD  ?Patient coming from: Home ? ?Chief Complaint:  ?Chief Complaint  ?Patient presents with  ? Abdominal Pain  ? ?HPI: Renee Blake is a 88 y.o. female with medical history significant of HFpEF, HTN, chronic AFib, stage IIIb CKD, rectal and skin cancer w/p XRT who presented to the ED with lower abdominal/pelvic/RLQ pain. This has been associated with nausea, poor appetite, unintentional weight loss, insomnia, and worsening for months, severe over the past 2 weeks and not appreciably improved with tylenol and tramadol prescribed as an outpatient. she was referred for MRI for further evaluation which was to be performed today, but instead she was brought to the ED due to severe pain.  ? ?MRI was performed and suggested cancer recurrence in the right pelvis. IV morphine and then IV dilaudid were given for pain and hospitalists consulted to admit for pain control.  ? ?Review of Systems: As mentioned in the history of present illness. All other systems reviewed and are negative. ?Past Medical History:  ?Diagnosis Date  ? Acute blood loss anemia   ? Atrial fibrillation ([place])   ? a. diagnosed in 12/2018. Rate-control pursued given not a candidate for anticoagulation  ? Chronic diarrhea   ? Dilation of biliary tract   ? Duodenal stricture   ? Esophageal stricture   ? Gastric ulcer   ? GI bleed   ? Hyperlipemia   ? Hypertension   ? Microscopic colitis   ? Nephrolithiasis   ? Pancreatic duct dilated   ? Schatzki's ring   ? Skin cancer   ? Sliding hiatal hernia   ? Vertigo   ? ?Past Surgical History:  ?Procedure Laterality Date  ? BIOPSY  07/28/2017  ? Procedure: BIOPSY;  Surgeon: Rogene Houston, MD;  Location: [facility];  Service: Endoscopy;;  rectum  ? COLONOSCOPY N/A 07/28/2017  ? Procedure: COLONOSCOPY;  Surgeon: Rogene Houston, MD;  Location:  [facility];  Service: Endoscopy;  Laterality: N/A;  ? ESOPHAGOGASTRODUODENOSCOPY N/A 02/27/2013  ? Procedure: ESOPHAGOGASTRODUODENOSCOPY (EGD);  Surgeon: Ladene Artist, MD;  Location: [facility] ENDOSCOPY;  Service: Endoscopy;  Laterality: N/A;  ? ESOPHAGOGASTRODUODENOSCOPY N/A 03/28/2013  ? Procedure: ESOPHAGOGASTRODUODENOSCOPY (EGD);  Surgeon: Rogene Houston, MD;  Location: [facility];  Service: Endoscopy;  Laterality: N/A;  ? HOT HEMOSTASIS N/A 02/27/2013  ? Procedure: HOT HEMOSTASIS (ARGON PLASMA COAGULATION/BICAP);  Surgeon: Ladene Artist, MD;  Location: [facility] ENDOSCOPY;  Service: Endoscopy;  Laterality: N/A;  ? NOSE SURGERY    ? for skin cancer  ? POLYPECTOMY  07/28/2017  ? Procedure: POLYPECTOMY;  Surgeon: Rogene Houston, MD;  Location: [facility];  Service: Endoscopy;;  colon ?  ? ?Social History:  reports that she has never smoked. She has never used smokeless tobacco. She reports that she does not drink alcohol and does not use drugs. ? ?Allergies  ?Allergen Reactions  ? Clonazepam Other (See Comments)  ?  Pruritis  ? Codeine Nausea And Vomiting and Other (See Comments)  ?  Reaction unknown  ? Sulfa Antibiotics Other (See Comments)  ?  Reactions unknown  ? ? ?Family History  ?Problem Relation Age of Onset  ? Heart attack Father   ? ? ?Prior to Admission medications   ?Medication Sig Start Date End Date Taking? Authorizing Provider  ?ALPRAZolam (XANAX) 0.25 MG tablet Take  1 tablet (0.25 mg total) by mouth 2 (two) times daily as needed. for anxiety 04/20/19  Yes Manuella Ghazi, Pratik D, DO  ?ALPRAZolam (XANAX) 0.5 MG tablet Take 0.5 mg by mouth at bedtime. 10/11/20  Yes [provider]  ?diltiazem (CARDIZEM SR) 90 MG 12 hr capsule Take 1 capsule (90 mg total) by mouth 2 (two) times daily. 05/17/20  Yes Verta Ellen., NP  ?diphenoxylate-atropine (LOMOTIL) 2.5-0.025 MG tablet Take 1 tablet by mouth 4 (four) times daily as needed for diarrhea or loose stools. 04/06/21  Yes Idol, Almyra Free, PA-C  ?levothyroxine  (SYNTHROID) 50 MCG tablet Take 50 mcg by mouth daily. 07/11/20  Yes [provider]  ?loperamide (IMODIUM A-D) 2 MG tablet Take 4 mg by mouth daily as needed for diarrhea or loose stools.   Yes [provider]  ?metoprolol succinate (TOPROL-XL) 25 MG 24 hr tablet Take 1 tablet (25 mg total) by mouth daily. 05/17/20 08/01/21 Yes Verta Ellen., NP  ?ondansetron (ZOFRAN) 4 MG tablet Take 1 tablet (4 mg total) by mouth every 8 (eight) hours as needed for nausea or vomiting. 03/27/20  Yes Hayden Rasmussen, MD  ?potassium chloride SA (KLOR-CON) 20 MEQ tablet Take 1 tablet (20 mEq total) by mouth daily. ?Patient taking differently: Take 20 mEq by mouth every other day. 05/17/20  Yes Verta Ellen., NP  ?Probiotic Product (PROBIOTIC DAILY PO) Take 1 tablet by mouth daily. One daily. Not sure of name but has to be refrigerated.   Yes [provider]  ?torsemide (DEMADEX) 20 MG tablet Take 2 tablets (40 mg total) by mouth every morning. 03/28/21  Yes Donato Heinz, MD  ?traMADol (ULTRAM) 50 MG tablet Take 50 mg by mouth every 6 (six) hours as needed for moderate pain. 06/29/20  Yes [provider]  ?acetaminophen (TYLENOL) 500 MG tablet Take 1,000 mg by mouth every 6 (six) hours as needed for mild pain, moderate pain, fever or headache.    [provider]  ? ? ?Physical Exam: ?Vitals:  ? 08/01/21 1330 08/01/21 1345 08/01/21 1400 08/01/21 1432  ?BP: (!) 161/101  (!) 171/102 (!) 184/98  ?Pulse: 97 98 (!) 105 (!) 102  ?Resp: '18 13 19 17  '$ ?Temp:      ?TempSrc:      ?SpO2: 98% 98% 99% 98%  ?Weight:      ?Height:      ?Gen: Frail, chronically ill-appearing female in no distress ?HEENT: Tacky MM ?Pulm: Nonlabored breathing room air. Clear, 100% on room air. ?CV: Irreg irreg w/rate in 90-100's. No murmur, rub, or gallop. No JVD, no dependent edema. ?GI: Abdomen soft, tender diffusely worse in RLQ without rebound or guarding. Normoactive bowel sounds.  ?Ext: Warm, no  deformities ?Skin: No rashes, lesions or ulcers on visualized skin. ?Neuro: Alert and oriented. No focal neurological deficits. ?Psych: Judgement and insight appear fair. Mood euthymic & affect congruent. Behavior is appropriate.   ? ?Data Reviewed: ?Labs largely unremarkable.  ?1. Right pelvic lesions which are new and significantly enlarged ?when compared to remote CTs including 07/10/2017. In this patient ?with a history of anorectal carcinoma, these are highly suspicious ?for nodal and possibly adnexal metastasis. Metachronous primary felt ?less likely. ?2. No abdominal metastatic disease or acute process. ?3. New small left pleural effusion since 04/06/2021. ?4. Chronic intrahepatic biliary duct dilatation, which could be ?correlated with bilirubin levels. ?5.  Possible constipation. ?6. Minimal similar right proximal hydroureter, without cause ?identified. ? ?Assessment and Plan: ?  Renee Blake is a 88yo F who presented for intractable lower abdominal/pelvic pain due to recurrence of malignancy in that area. Outpatient follow up with [facility] will be arranged for ongoing care per patient and family preference. Dr. Delton Coombes has been made aware of this patient. She is being admitted for intractable pain in this setting.  ?- Start Percocet '5mg'$  prn and can continue IV for breakthrough. She shows no evidence of over sedation at this time. Pain control expectations discussed.  ?- Give zofran as she has baseline nausea and opioids tend to make her more nauseated. Given her overall frailty, we've liberalized diet.  ? ?Chronic atrial fibrillation:  ?- Restart home medications ? ?HTN: Exacerbated by not taking meds today and pain ?- Restart home medications ? ?Stage IIIb CKD: Creatinine at/better than suspected baseline at this time. Appears slightly dry but hasn't had anything to drink today. Proximal right hydroureter is of unclear significance. Defer to outpatient providers.  ? ? Advance Care  Planning: DNR ? ?Consults: Oncology ? ?Family Communication: Daughters at bedside ? ?Severity of Illness: ?The appropriate patient status for this patient is OBSERVATION. Observation status is judged to be [place]

## 2021-08-02 DIAGNOSIS — R109 Unspecified abdominal pain: Secondary | ICD-10-CM | POA: Diagnosis not present

## 2021-08-02 DIAGNOSIS — R102 Pelvic and perineal pain: Secondary | ICD-10-CM | POA: Diagnosis not present

## 2021-08-02 MED ORDER — ACETAMINOPHEN 500 MG PO TABS: 500.0000 mg | ORAL_TABLET | Freq: Four times a day (QID) | ORAL | 0 refills | Status: DC | PRN

## 2021-08-02 MED ORDER — OXYCODONE-ACETAMINOPHEN 5-325 MG PO TABS
1.0000 | ORAL_TABLET | Freq: Four times a day (QID) | ORAL | 0 refills | Status: DC | PRN
Start: 2021-08-02 — End: 2021-08-13

## 2021-08-02 NOTE — TOC Progression Note (Signed)
?  Transition of Care (TOC) Screening Note ? ? ?Patient Details  ?Name: Renee Blake ?Date of Birth: 1928-05-31 ? ? ?Transition of Care (TOC) CM/SW Contact:    ?Shade Flood, LCSW ?Phone Number: ?08/02/2021, 8:49 AM ? ? ? ?Transition of Care Department [facility]) has reviewed patient and no TOC needs have been identified at this time. We will continue to monitor patient advancement through interdisciplinary progression rounds. If new patient transition needs arise, please place a TOC consult. ? ? ?

## 2021-08-02 NOTE — Discharge Summary (Signed)
?Physician Discharge Summary ?  ?Patient: Renee Blake MRN: [id number] DOB: [birth year]  ?Admit date:     08/01/2021  ?Discharge date: 08/02/21  ?Discharge Physician: Patrecia Pour  ? ?PCP: [name], MD  ? ?Recommendations at discharge:  ?Follow up with oncology, Dr. Delton Coombes for suspicion of recurrent anorectal carcinoma based on MRI abd/pelvis 5/3. His office will arrange follow up. ? ?Discharge Diagnoses: ?Principal Problem: ?  Intractable abdominal pain ? ?Hospital Course: ?Renee Blake is a 88 y.o. female with medical history significant of HFpEF, HTN, chronic AFib, stage IIIb CKD, rectal and skin cancer w/p XRT who presented to the ED with lower abdominal/pelvic/RLQ pain. This has been associated with nausea, poor appetite, unintentional weight loss, insomnia, and worsening for months, severe over the past 2 weeks and not appreciably improved with tylenol and tramadol prescribed as an outpatient. she was referred for MRI for further evaluation which was to be performed today, but instead she was brought to the ED due to severe pain.  ?  ?MRI was performed and suggested cancer recurrence in the right pelvis. IV morphine and then IV dilaudid were given for pain and hospitalists consulted to admit for pain control.  ? ?Pain has been controlled with percocet and the patient is now stable for discharge.  ? ?Assessment and Plan: ?Renee Blake is a 88yo F who presented for intractable lower abdominal/pelvic pain due to recurrence of malignancy in that area. Outpatient follow up with [facility] will be arranged for ongoing care per patient and family preference. Dr. Delton Coombes aware. Pain now controlled. ?- Start Percocet '5mg'$  prn. She shows no evidence of over sedation at this time. Pain control expectations discussed.   ?  ?Chronic atrial fibrillation:  ?- Restart home medications ?  ?HTN: Exacerbated by not taking meds today and pain ?- Restart home medications ?  ?Stage IIIb CKD: Creatinine  at/better than suspected baseline at this time. Appears slightly dry but hasn't had anything to drink today. Proximal right hydroureter is of unclear significance. Defer to outpatient providers.  ? ?Imaging states:  ?1. Right pelvic lesions which are new and significantly enlarged ?when compared to remote CTs including 07/10/2017. In this patient ?with a history of anorectal carcinoma, these are highly suspicious ?for nodal and possibly adnexal metastasis. Metachronous primary felt ?less likely. ?2. No abdominal metastatic disease or acute process. ?3. New small left pleural effusion since 04/06/2021. ?4. Chronic intrahepatic biliary duct dilatation, which could be ?correlated with bilirubin levels. ?5.  Possible constipation. ?6. Minimal similar right proximal hydroureter, without cause ?identified. ?  ?Pain control - [employer] Controlled Substance Reporting System database was reviewed. and patient was instructed, not to drive, operate heavy machinery, perform activities at heights, swimming or participation in water activities or provide baby-sitting services while on Pain, Sleep and Anxiety Medications; until their outpatient Physician has advised to do so again. Also recommended to not to take more than prescribed Pain, Sleep and Anxiety Medications.  ? ?Consultants: Discussed with Dr. Delton Coombes by phone on day of discharge and message sent on day of presentation ?Procedures performed: None  ?Disposition: Home ?Diet recommendation:  ?Regular diet ?DISCHARGE MEDICATION: ?Allergies as of 08/02/2021   ? ?   Reactions  ? Clonazepam Other (See Comments)  ? Pruritis  ? Codeine Nausea And Vomiting, Other (See Comments)  ? Reaction unknown  ? Sulfa Antibiotics Other (See Comments)  ? Reactions unknown  ? ?  ? ?  ?Medication List  ?  ? ?  STOP taking these medications   ? ?traMADol 50 MG tablet ?Commonly known as: ULTRAM ?  ? ?  ? ?TAKE these medications   ? ?acetaminophen 500 MG tablet ?Commonly known as:  TYLENOL ?Take 1 tablet (500 mg total) by mouth every 6 (six) hours as needed for mild pain, moderate pain, fever or headache. ?What changed: how much to take ?  ?ALPRAZolam 0.25 MG tablet ?Commonly known as: Duanne Moron ?Take 1 tablet (0.25 mg total) by mouth 2 (two) times daily as needed. for anxiety ?  ?ALPRAZolam 0.5 MG tablet ?Commonly known as: Duanne Moron ?Take 0.5 mg by mouth at bedtime. ?  ?diltiazem 90 MG 12 hr capsule ?Commonly known as: CARDIZEM SR ?Take 1 capsule (90 mg total) by mouth 2 (two) times daily. ?  ?diphenoxylate-atropine 2.5-0.025 MG tablet ?Commonly known as: Lomotil ?Take 1 tablet by mouth 4 (four) times daily as needed for diarrhea or loose stools. ?  ?levothyroxine 50 MCG tablet ?Commonly known as: SYNTHROID ?Take 50 mcg by mouth daily. ?  ?loperamide 2 MG tablet ?Commonly known as: IMODIUM A-D ?Take 4 mg by mouth daily as needed for diarrhea or loose stools. ?  ?metoprolol succinate 25 MG 24 hr tablet ?Commonly known as: TOPROL-XL ?Take 1 tablet (25 mg total) by mouth daily. ?  ?ondansetron 4 MG tablet ?Commonly known as: Zofran ?Take 1 tablet (4 mg total) by mouth every 8 (eight) hours as needed for nausea or vomiting. ?  ?oxyCODONE-acetaminophen 5-325 MG tablet ?Commonly known as: PERCOCET/ROXICET ?Take 1-2 tablets by mouth every 6 (six) hours as needed for severe pain or moderate pain. ?  ?potassium chloride SA 20 MEQ tablet ?Commonly known as: KLOR-CON M ?Take 1 tablet (20 mEq total) by mouth daily. ?What changed: when to take this ?  ?PROBIOTIC DAILY PO ?Take 1 tablet by mouth daily. One daily. Not sure of name but has to be refrigerated. ?  ?torsemide 20 MG tablet ?Commonly known as: DEMADEX ?Take 2 tablets (40 mg total) by mouth every morning. ?  ? ?  ? ? Follow-up Information   ? ? [name], MD .   ?Specialty: Internal Medicine ?Contact information: ?[address] ?[place] ?[place] [address] ?[phone] ? ? ?  ?  ? ? Donnamae Jude, FNP Follow up.   ?Specialty: Family Medicine ?Contact  information: ?[address] ?[place] [address] ?[phone] ? ? ?  ?  ? ? Derek Jack, MD. Schedule an appointment as soon as possible for a visit.   ?Specialty: Hematology ?Why: Dr. Raliegh Ip states the office will reach out to you shortly. Please call if you don't hear from them by the end of the week. ?Contact information: ?[address] ?[place] [address] ?[phone] ? ? ?  ?  ? ?  ?  ? ?  ? ?Discharge Exam: ?Filed Weights  ? 08/01/21 0929  ?Weight: 49.4 kg  ?BP 117/64 (BP Location: Left Arm)   Pulse 64   Temp 97.6 ?F (36.4 ?C) (Oral)   Resp 19   Ht '5\' 6"'$  (1.676 m)   Wt 49.4 kg   SpO2 95%   BMI 17.59 kg/m?   ?Frail elderly female in no distress ?Clear, nonlabored ?Irreg irreg, no edema ?Abd soft, very minimally tender to palpation in RLQ without rebound or guarding. +BS. No palpable masses ? ?Condition at discharge: stable ? ?The results of significant diagnostics from this hospitalization (including imaging, microbiology, ancillary and laboratory) are listed below for reference.  ? ?Imaging  Studies: ?MR PELVIS W WO CONTRAST ? ?Result Date: 08/01/2021 ?CLINICAL DATA:  Unintentional weight loss. Abdominal pain. EMR clinical history includes anorectal carcinoma. EXAM: MRI ABDOMEN AND PELVIS WITHOUT AND WITH CONTRAST TECHNIQUE: Multiplanar multisequence MR imaging of the abdomen and pelvis was performed both before and after the administration of intravenous contrast. CONTRAST:  63m GADAVIST GADOBUTROL 1 MMOL/ML IV SOLN COMPARISON:  04/06/2021 abdominopelvic CT. 04/13/2021 pelvic ultrasound. FINDINGS: COMBINED FINDINGS FOR BOTH MR ABDOMEN AND PELVIS Lower chest: Small left pleural effusion is new since 04/06/2021. Mild cardiomegaly. Hepatobiliary: No focal liver lesion. Normal gallbladder. Mild intrahepatic biliary duct dilatation has been present back to 07/10/2017 CT, possibly within normal variation. Normal common duct caliber for age, without choledocholithiasis. Pancreas: The pancreas is  atrophic and the duct is borderline dilated. This is progressive when compared back to 07/10/2017. Duct is followed to the level of the ampulla, without obstructive stone or mass. No acute inflammation. Spleen: Posterior sp

## 2021-08-02 NOTE — Plan of Care (Signed)

## 2021-08-06 DIAGNOSIS — C20 Malignant neoplasm of rectum: Secondary | ICD-10-CM | POA: Diagnosis not present

## 2021-08-06 DIAGNOSIS — R64 Cachexia: Secondary | ICD-10-CM | POA: Diagnosis not present

## 2021-08-06 DIAGNOSIS — I10 Essential (primary) hypertension: Secondary | ICD-10-CM | POA: Diagnosis not present

## 2021-08-06 DIAGNOSIS — D6869 Other thrombophilia: Secondary | ICD-10-CM | POA: Diagnosis not present

## 2021-08-06 DIAGNOSIS — N1832 Chronic kidney disease, stage 3b: Secondary | ICD-10-CM | POA: Diagnosis not present

## 2021-08-06 DIAGNOSIS — I482 Chronic atrial fibrillation, unspecified: Secondary | ICD-10-CM | POA: Diagnosis not present

## 2021-08-06 DIAGNOSIS — R197 Diarrhea, unspecified: Secondary | ICD-10-CM | POA: Diagnosis not present

## 2021-08-06 DIAGNOSIS — R634 Abnormal weight loss: Secondary | ICD-10-CM | POA: Diagnosis not present

## 2021-08-06 DIAGNOSIS — I700 Atherosclerosis of aorta: Secondary | ICD-10-CM | POA: Diagnosis not present

## 2021-08-06 DIAGNOSIS — E611 Iron deficiency: Secondary | ICD-10-CM | POA: Diagnosis not present

## 2021-08-06 DIAGNOSIS — I509 Heart failure, unspecified: Secondary | ICD-10-CM | POA: Diagnosis not present

## 2021-08-09 ENCOUNTER — Telehealth: Payer: Self-pay | Admitting: *Deleted

## 2021-08-09 NOTE — Telephone Encounter (Signed)
Notified by new patient scheduling team that daughter is requesting appointment sooner than 08/15/21 at [facility]. Spoke with daughter and informed her she could be seen on 5/15 at 1:40 pm (arrive 15 minutes early), but to keep in mind that treatment could involve RT M-F for several weeks and it may be easier on her to stay at [facility] w/RT at [facility]. She reports that she had 5 weeks of RT in 2019 for rectal cancer and they drove her to [facility] then. She wants patient seen in [place]. Appointment scheduled and make aware of mask, 1 visitor policy and to bring photo ID and insurance card. Was given address to office. [facility] new patient scheduler made aware. ?

## 2021-08-13 ENCOUNTER — Inpatient Hospital Stay: Payer: Medicare Other | Attending: Oncology | Admitting: Oncology

## 2021-08-13 ENCOUNTER — Other Ambulatory Visit: Payer: Self-pay | Admitting: Nurse Practitioner

## 2021-08-13 VITALS — BP 119/69 | HR 83 | Temp 98.1°F | Resp 18 | Ht 66.0 in | Wt 105.8 lb

## 2021-08-13 DIAGNOSIS — Z85828 Personal history of other malignant neoplasm of skin: Secondary | ICD-10-CM

## 2021-08-13 DIAGNOSIS — R1909 Other intra-abdominal and pelvic swelling, mass and lump: Secondary | ICD-10-CM | POA: Diagnosis not present

## 2021-08-13 DIAGNOSIS — Z85048 Personal history of other malignant neoplasm of rectum, rectosigmoid junction, and anus: Secondary | ICD-10-CM

## 2021-08-13 DIAGNOSIS — J90 Pleural effusion, not elsewhere classified: Secondary | ICD-10-CM

## 2021-08-13 DIAGNOSIS — R197 Diarrhea, unspecified: Secondary | ICD-10-CM | POA: Diagnosis not present

## 2021-08-13 DIAGNOSIS — Z923 Personal history of irradiation: Secondary | ICD-10-CM | POA: Diagnosis not present

## 2021-08-13 DIAGNOSIS — I4891 Unspecified atrial fibrillation: Secondary | ICD-10-CM | POA: Diagnosis not present

## 2021-08-13 DIAGNOSIS — Z87442 Personal history of urinary calculi: Secondary | ICD-10-CM

## 2021-08-13 DIAGNOSIS — Z8711 Personal history of peptic ulcer disease: Secondary | ICD-10-CM | POA: Diagnosis not present

## 2021-08-13 DIAGNOSIS — R102 Pelvic and perineal pain: Secondary | ICD-10-CM

## 2021-08-13 NOTE — Progress Notes (Signed)
?[place] ?New Patient Consult ? ? ?Requesting MD: ?Fredia Sorrow, Md ?[address] ?[place],  [place] [address] ? ? ?Renee Blake ?88 y.o.  ?[birth year] ? ?  ?Reason for Consult: Anorectal versus cervical cancer ? ? ?HPI: Renee Blake had rectal bleeding beginning in March 2019.  A colonoscopy 07/18/2017 revealed a polyp and distal rectal mass.  Biopsies revealed chronic inflammatory change in the polyp.  Squamous cell carcinoma was noted in the rectal mass.  Review of the pathology at [facility] revealed at least high-grade squamous intraepithelial lesion suspicious for invasion. ?She was referred to [facility] and was admitted 07/30/2017.  She underwent biopsy of the vaginal vestibule with pathology revealing invasive moderately differentiated squamous cell carcinoma.  There was an overlying high-grade squamous intraepithelial lesion.  Immunohistochemical stains could not differentiate between squamous of carcinoma of the anorectal canal, perineum/vulva, or cervicovaginal regions. ? ?A PET 08/01/2017 revealed a hypermetabolic mass centered in the distal rectum extending to the anus with invasion of the posterior cervix.  Likely reactive hilar node and indeterminate thyroid uptake.  A chest CT revealed indeterminate pulmonary nodules.  She was treated with radiation to the pelvic mass, 5000 cGy, completed 09/19/2017.  There was clinical evidence of a good treatment response with a PET 12/31/2017 showing a marked decrease of conspicuity of the anorectal mass and an unchanged moderately avid right hilar node and unchanged right thyroid nodule.  There was an indeterminate nonavid left adnexal cystic lesion. ? ?She develops pain at the sacrum/right buttock extending into the right groin and right upper leg approximately 2 months ago. ? ?She was admitted 08/02/2021 with increased pain.  This is associated with anorexia and weight loss.  Tylenol and tramadol have not helped the pain.  She was prescribed oxycodone.  She  continues oxycodone 3 times daily and this provides adequate pain relief. ? ?An MRI of the abdomen and pelvis 08/01/2021 revealed new right pelvic lesions spacious for nodal/adnexal metastases.  No other evidence of metastatic disease.  New small left pleural effusion compared to January 2023.  A CT abdomen and pelvis 04/06/2021 revealed complex septated/cystic lesions in the right pelvis measuring 20 and 23 mm.   ? ? ?Past Medical History:  ?Diagnosis Date  ? Acute blood loss anemia   ? Atrial fibrillation ([place])   ? a. diagnosed in 12/2018. Rate-control pursued given not a candidate for anticoagulation  ? Chronic diarrhea   ? Dilation of biliary tract   ? Duodenal stricture   ? Esophageal stricture   ? Gastric ulcer   ? GI bleed   ? Hyperlipemia   ? Hypertension   ? Microscopic colitis   ? Nephrolithiasis   ? Pancreatic duct dilated   ? Schatzki's ring   ? Skin cancer-basal cell carcinoma of the face   ? Sliding hiatal hernia   ? Vertigo   ?  .  G6, P6 ?  .  Squamous cell carcinoma of the rectum/cervix 2019 ? ?Past Surgical History:  ?Procedure Laterality Date  ? BIOPSY  07/28/2017  ? Procedure: BIOPSY;  Surgeon: Rogene Houston, MD;  [facility];  Service: Endoscopy;;  rectum  ? COLONOSCOPY N/A 07/28/2017  ? Procedure: COLONOSCOPY;  Surgeon: Rogene Houston, MD;  Location: [facility];  Service: Endoscopy;  Laterality: N/A;  ? ESOPHAGOGASTRODUODENOSCOPY N/A 02/27/2013  ? Procedure: ESOPHAGOGASTRODUODENOSCOPY (EGD);  Surgeon: Ladene Artist, MD;  Location[facility] ENDOSCOPY;  Service: Endoscopy;  Laterality: N/A;  ? ESOPHAGOGASTRODUODENOSCOPY N/A 03/28/2013  ?  Procedure: ESOPHAGOGASTRODUODENOSCOPY (EGD);  Surgeon: Rogene Houston, MD;  [facility]: [facility];  Service: Endoscopy;  Laterality: N/A;  ? HOT HEMOSTASIS N/A 02/27/2013  ? Procedure: HOT HEMOSTASIS (ARGON PLASMA COAGULATION/BICAP);  Surgeon: Ladene Artist, MD;  Location: [facility] ENDOSCOPY;  Service: Endoscopy;  Laterality: N/A;  ? NOSE SURGERY    ?  for skin cancer  ? POLYPECTOMY  07/28/2017  ? Procedure: POLYPECTOMY;  Surgeon: Rogene Houston, MD;  [facility]: [facility];  Service: Endoscopy;;  colon ?  ? ? ?Medications: Reviewed ? ?Allergies:  ?Allergies  ?Allergen Reactions  ? Clonazepam Other (See Comments)  ?  Pruritis  ? Codeine Nausea And Vomiting and Other (See Comments)  ?  Reaction unknown  ? Sulfa Antibiotics Other (See Comments)  ?  Reactions unknown  ? ? ?Family history: Family history of skin cancer ? ?Social History:  ? ?She lives with her daughter in [place].  She previously worked in a Radiation protection practitioner.  She does not use cigarettes or alcohol.  No transfusion history.  She has received COVID-19 and influenza vaccines.  No risk factor for HIV or hepatitis. ? ?ROS:  ? ?Positives include: ? ?A complete ROS was otherwise negative. ? ?Physical Exam: ? ?Blood pressure 119/69, pulse 83, temperature 98.1 ?F (36.7 ?C), temperature source Oral, resp. rate 18, height '5\' 6"'$  (1.676 m), weight 105 lb 12.8 oz (48 kg), SpO2 100 %. ? ?HEENT: Oropharynx without visible mass, neck without mass ?Lungs: Clear bilaterally ?Cardiac: Regular rate and rhythm ?Abdomen: No hepatosplenomegaly, no mass, nontender ?Rectal: Radiation changes at the perineum, soft external hemorrhoids/skin tags, no rectal mass, no mass at the perineum or labia ?Vascular: No leg edema ?Lymph nodes: No cervical, supraclavicular, axillary, or inguinal nodes ?Neurologic: Alert and oriented, the motor exam appears intact in the upper and lower extremities bilaterally ?Skin: No rash ?Musculoskeletal: No spine tenderness, no sacral tenderness ? ? ?LAB: ? ?CBC ? ?Lab Results  ?Component Value Date  ? WBC 6.4 08/01/2021  ? HGB 11.5 (L) 08/01/2021  ? HCT 36.1 08/01/2021  ? MCV 91.4 08/01/2021  ? PLT 320 08/01/2021  ? NEUTROABS 4.9 08/01/2021  ?  ? ?  ? ?CMP  ?Lab Results  ?Component Value Date  ? NA 138 08/01/2021  ? K 3.7 08/01/2021  ? CL 106 08/01/2021  ? CO2 23 08/01/2021  ? GLUCOSE 89 08/01/2021  ?  BUN 15 08/01/2021  ? CREATININE 1.01 (H) 08/01/2021  ? CALCIUM 9.4 08/01/2021  ? PROT 8.4 (H) 08/01/2021  ? ALBUMIN 3.9 08/01/2021  ? AST 13 (L) 08/01/2021  ? ALT 10 08/01/2021  ? ALKPHOS 100 08/01/2021  ? BILITOT 0.4 08/01/2021  ? GFRNONAA 53 (L) 08/01/2021  ? GFRAA 51 (L) 10/18/2019  ? ? ? ?Lab Results  ?Component Value Date  ? CEA1 4.4 03/15/2019  ? ? ?Imaging: ?As per HPI, 04/06/2021 CT and 08/01/2021 MRI reviewed ? ? ?Assessment/Plan:  ? ?Squamous cell carcinoma the distal rectum invading the cervix-primary site not confirmed ?Colonoscopy 07/28/2017-firm mass at the anterior aspect of the anal orifice with central ulceration-submucosal, 6 mm polyp in the distal sigmoid colon-biopsy invasive moderately differentiated squamous cell carcinoma of the rectum, inflammatory polyp ?07/30/2017-[facility] vaginal vestibule biopsy-moderately differentiated squamous of carcinoma, keratinizing, unable to differentiate between anal rectal, perineal/vulvar, or cervicovaginal origin ?08/01/2017-PET-hypermetabolic mass in the distal rectum extending to the anus and invading the posterior cervix, likely reactive hilar node, indeterminate thyroid uptake ?Chest [facility] at [facility] 08/02/1998 19-0.9 cm indeterminate right apical nodule and  0.4 cm nodule at the minor fissure, 0.6 cm enhancing splenic lesion  ?XRT, 5000 cGy to pelvic mass completed 09/19/2017 ?PET 12/31/2017-markedly decreased conspicuity of anorectal mass, unchanged moderately avid right hilar node, unchanged right thyroid nodule, indeterminate none avid left adnexal cystic lesion ?CT abdomen/pelvis 04/06/2021-right pelvic complex septated/cystic lesions-adnexal neoplasm versus abnormal chronic lymph nodes ?MRI abdomen, MRI pelvis 08/01/2021-enlarged right pelvic lesions spacious for nodal or adnexal metastases, no abdominal metastatic disease, new small left pleural effusion ?Pain secondary to adnexal recurrence of squamous of carcinoma ?Atrial fibrillation ?Chronic diarrhea ?History of a gastric  ulcer ?History of skin cancers ?History of nephrolithiasis ? ? ?Disposition:  ? ?Ms. Mitter was diagnosed with squamous cell carcinoma of the rectum with involvement of the cervix and the spring 2019.  The

## 2021-08-14 ENCOUNTER — Telehealth: Payer: Self-pay | Admitting: *Deleted

## 2021-08-14 NOTE — Telephone Encounter (Signed)
Notified daughter, Renee Blake of PET scan at [facility] on 08/23/21 at 0930/1000. NPO except plain water for 6 hours prior (no gum or candy). Managed care notified to work on [place]. ?Email to [facility] pathology requesting PD1 score on 2019 rectal biopsy case # [id number]. ?

## 2021-08-15 ENCOUNTER — Ambulatory Visit (HOSPITAL_COMMUNITY): Payer: Medicare Other | Admitting: Hematology

## 2021-08-23 ENCOUNTER — Encounter (HOSPITAL_COMMUNITY)
Admission: RE | Admit: 2021-08-23 | Discharge: 2021-08-23 | Disposition: A | Discharge status: Home / Self Care | Payer: Medicare Other | Source: Ambulatory Visit | Attending: Oncology | Admitting: Oncology

## 2021-08-23 DIAGNOSIS — C775 Secondary and unspecified malignant neoplasm of intrapelvic lymph nodes: Secondary | ICD-10-CM | POA: Diagnosis not present

## 2021-08-23 DIAGNOSIS — M4186 Other forms of scoliosis, lumbar region: Secondary | ICD-10-CM | POA: Diagnosis not present

## 2021-08-23 DIAGNOSIS — Z85048 Personal history of other malignant neoplasm of rectum, rectosigmoid junction, and anus: Secondary | ICD-10-CM | POA: Insufficient documentation

## 2021-08-23 DIAGNOSIS — C218 Malignant neoplasm of overlapping sites of rectum, anus and anal canal: Secondary | ICD-10-CM | POA: Diagnosis not present

## 2021-08-23 DIAGNOSIS — I2510 Atherosclerotic heart disease of native coronary artery without angina pectoris: Secondary | ICD-10-CM | POA: Diagnosis not present

## 2021-08-23 IMAGING — CT NM PET TUM IMG RESTAG (PS) SKULL BASE T - THIGH
1 of 7 series · 1 of 25 positions shown · non-contrast
Comparison: MRI abdomen/pelvis dated [DATE]. CT abdomen/pelvis
dated [DATE]. CTA chest dated [DATE].

CLINICAL DATA: Subsequent treatment strategy for anorectal cancer,
new right pelvic masses.

EXAM:
NUCLEAR MEDICINE PET SKULL BASE TO THIGH
TECHNIQUE: 5.3 mCi F-18 FDG was injected intravenously. Full-ring PET imaging
was performed from the skull base to thigh after the radiotracer. CT
data was obtained and used for attenuation correction and anatomic
localization.
Fasting blood glucose: 105 mg/dl

[Series 3: ctac · axial · 3.0mm · 0.98mm/px · 1 of 293 slices shown]
[im 293/293  brain]
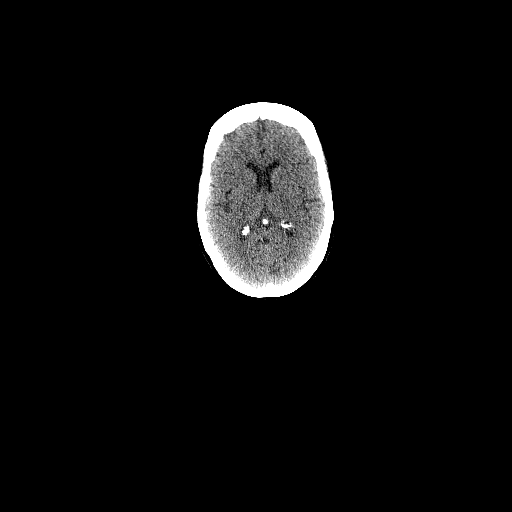

[1 of 25 positions shown; findings below may reference images not displayed]

FINDINGS: Mediastinal blood pool activity: SUV max

Liver activity: SUV max NA

NECK: No hypermetabolic cervical lymphadenopathy.

Incidental CT findings: none

CHEST: No hypermetabolic thoracic lymphadenopathy.

No suspicious pulmonary nodules.

Incidental CT findings: Atherosclerotic calcifications of the aortic
arch. Coronary atherosclerosis of the LAD and right coronary artery.

ABDOMEN/PELVIS: Three hypermetabolic right pelvic nodes, as follows:

--1.8 cm short axis right external iliac node (series 3/image 201),
max SUV

--2.0 cm short axis right internal iliac nodal mass (series 3/image
206) max SUV

--1.6 cm short axis right obturator node (series 3/image 207), max
SUV

No focal hypermetabolism in the anorectal region to suggest residual
tumor.

No abnormal hypermetabolism in the liver, spleen, pancreas, or
adrenal glands.

Incidental CT findings: 4 mm nonobstructing right lower pole renal
calculus. Right upper pole renal cyst. Atherosclerotic
calcifications the abdominal aorta and branch vessels.

SKELETON: No focal hypermetabolic activity to suggest skeletal
metastasis.

Incidental CT findings: Lumbar levoscoliosis with degenerative
changes.
IMPRESSION: Hypermetabolic right pelvic nodal metastases, as above.

## 2021-08-23 MED ORDER — FLUDEOXYGLUCOSE F - 18 (FDG) INJECTION
5.2600 | Freq: Once | INTRAVENOUS | Status: AC | PRN
  Administered 2021-08-23: 5.26 via INTRAVENOUS

## 2021-08-28 ENCOUNTER — Ambulatory Visit
Admission: RE | Admit: 2021-08-28 | Discharge: 2021-08-28 | Disposition: A | Discharge status: Home / Self Care | Payer: Self-pay | Source: Ambulatory Visit | Attending: Radiation Oncology | Admitting: Radiation Oncology

## 2021-08-28 ENCOUNTER — Other Ambulatory Visit: Payer: Self-pay | Admitting: Radiation Oncology

## 2021-08-28 DIAGNOSIS — K6289 Other specified diseases of anus and rectum: Secondary | ICD-10-CM

## 2021-08-31 ENCOUNTER — Encounter: Payer: Self-pay | Admitting: Nurse Practitioner

## 2021-08-31 ENCOUNTER — Encounter (HOSPITAL_COMMUNITY): Payer: Self-pay | Admitting: Oncology

## 2021-08-31 ENCOUNTER — Inpatient Hospital Stay: Payer: Medicare Other | Attending: Nurse Practitioner | Admitting: Nurse Practitioner

## 2021-08-31 ENCOUNTER — Inpatient Hospital Stay: Payer: Medicare Other

## 2021-08-31 VITALS — BP 101/60 | HR 100 | Temp 98.1°F | Resp 18 | Ht 66.0 in | Wt 106.0 lb

## 2021-08-31 DIAGNOSIS — G893 Neoplasm related pain (acute) (chronic): Secondary | ICD-10-CM | POA: Insufficient documentation

## 2021-08-31 DIAGNOSIS — Z87442 Personal history of urinary calculi: Secondary | ICD-10-CM | POA: Diagnosis not present

## 2021-08-31 DIAGNOSIS — Z85048 Personal history of other malignant neoplasm of rectum, rectosigmoid junction, and anus: Secondary | ICD-10-CM | POA: Diagnosis not present

## 2021-08-31 DIAGNOSIS — R197 Diarrhea, unspecified: Secondary | ICD-10-CM | POA: Insufficient documentation

## 2021-08-31 DIAGNOSIS — C20 Malignant neoplasm of rectum: Secondary | ICD-10-CM | POA: Diagnosis not present

## 2021-08-31 DIAGNOSIS — I4891 Unspecified atrial fibrillation: Secondary | ICD-10-CM | POA: Diagnosis not present

## 2021-08-31 DIAGNOSIS — Z923 Personal history of irradiation: Secondary | ICD-10-CM | POA: Insufficient documentation

## 2021-08-31 DIAGNOSIS — Z8711 Personal history of peptic ulcer disease: Secondary | ICD-10-CM | POA: Diagnosis not present

## 2021-08-31 DIAGNOSIS — Z85828 Personal history of other malignant neoplasm of skin: Secondary | ICD-10-CM | POA: Diagnosis not present

## 2021-08-31 MED ORDER — ONDANSETRON HCL 8 MG PO TABS
8.0000 mg | ORAL_TABLET | Freq: Once | ORAL | Status: AC
  Administered 2021-08-31: 8 mg via ORAL
  Filled 2021-08-31: qty 1

## 2021-08-31 MED ORDER — OXYCODONE-ACETAMINOPHEN 10-325 MG PO TABS: 1.0000 | ORAL_TABLET | Freq: Three times a day (TID) | ORAL | 0 refills | Status: DC | PRN

## 2021-08-31 NOTE — Progress Notes (Signed)
[facility] OFFICE PROGRESS NOTE   Diagnosis: Anorectal versus cervical cancer  INTERVAL HISTORY:   Renee Blake returns as scheduled.  She continues to have right lower abdomen pain.  She estimates taking 1 Percocet tablet every 8 hours with 4-6 hours of relief.  She has nausea.  She takes Zofran as needed.  She estimates 2-3 loose stools a day.  Objective:  Vital signs in last 24 hours:  Blood pressure 101/60, pulse 100, temperature 98.1 F (36.7 C), temperature source Oral, resp. rate 18, height 5' 6"  (1.676 m), weight 106 lb (48.1 kg), SpO2 100 %.     Resp: Lungs clear bilaterally. Cardio: Regular rate and rhythm. GI: Abdomen soft and nontender.  No hepatosplenomegaly.  No mass. Vascular: No leg edema.   Lab Results:  Lab Results  Component Value Date   WBC 6.4 08/01/2021   HGB 11.5 (L) 08/01/2021   HCT 36.1 08/01/2021   MCV 91.4 08/01/2021   PLT 320 08/01/2021   NEUTROABS 4.9 08/01/2021    Imaging:  No results found.  Medications: I have reviewed the patient's current medications.  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-primary site not confirmed Colonoscopy 07/28/2017-firm mass at the anterior aspect of the anal orifice with central ulceration-submucosal, 6 mm polyp in the distal sigmoid colon-biopsy invasive moderately differentiated squamous cell carcinoma of the rectum, inflammatory polyp 07/30/2017-[facility] vaginal vestibule biopsy-moderately differentiated squamous of carcinoma, keratinizing, unable to differentiate between anal rectal, perineal/vulvar, or cervicovaginal origin 08/01/2017-PET-hypermetabolic mass in the distal rectum extending to the anus and invading the posterior cervix, likely reactive hilar node, indeterminate thyroid uptake Chest CT at [facility] 08/02/1998 19-0.9 cm indeterminate right apical nodule and 0.4 cm nodule at the minor fissure, 0.6 cm enhancing splenic lesion  XRT, 5000 cGy to pelvic mass  completed 09/19/2017 PET 12/31/2017-markedly decreased conspicuity of anorectal mass, unchanged moderately avid right hilar node, unchanged right thyroid nodule, indeterminate none avid left adnexal cystic lesion CT abdomen/pelvis 04/06/2021-right pelvic complex septated/cystic lesions-adnexal neoplasm versus abnormal chronic lymph nodes MRI abdomen, MRI pelvis 08/01/2021-enlarged right pelvic lesions spacious for nodal or adnexal metastases, no abdominal metastatic disease, new small left pleural effusion PD-1 testing on the 2019 rectal biopsy-PD-L1 CPS 0% 08/23/2021 PET scan-hypermetabolic right pelvic nodal metastases. Referred for radiation Pain secondary to adnexal recurrence of squamous of carcinoma Atrial fibrillation Chronic diarrhea History of a gastric ulcer History of skin cancers History of nephrolithiasis    Disposition: [name] appears unchanged.  She continues to have right low abdominal pain.  We reviewed the recent PET scan report/images with her and her daughters.  Disease appears localized to the right pelvis.  Dr. Benay Spice reviewed her case with Dr. Lisbeth Renshaw.  She is likely a candidate for radiation.  Dr. Lisbeth Renshaw will see her next week.  We discussed Xeloda for radiosensitization.  We reviewed potential toxicities.  She declines chemotherapy.  She will return for follow-up in approximately 3 weeks.  We are available to see her sooner.  Percocet refill sent to her pharmacy.  Patient seen with Dr. Benay Spice.    Ned Card ANP/GNP-BC   08/31/2021  1:20 PM  This was a shared visit with Ned Card.  We discussed the PET findings and reviewed the images with Renee Blake and her family.  I discussed the case with Dr. Lisbeth Renshaw.  Dr. Lisbeth Renshaw has received records from [facility].  He indicates she is a candidate for additional radiation.  Renee Blake does not wish to consider systemic therapy  including radiosensitizing capecitabine.  We reviewed potential toxicities associated with capecitabine.  I  recommend capecitabine to be given concurrent with radiation.  She declines.  She will see Dr. Lisbeth Renshaw next week.  She will continue oxycodone for pain.  She will call if she does not obtain adequate pain relief with oxycodone.  She will return for an office visit after completion of radiation.  I was present for greater than 50% of today's visit.  I performed medical decision making.  Julieanne Manson, MD

## 2021-09-03 ENCOUNTER — Telehealth: Payer: Self-pay

## 2021-09-03 ENCOUNTER — Other Ambulatory Visit: Payer: Self-pay | Admitting: Nurse Practitioner

## 2021-09-03 DIAGNOSIS — Z85048 Personal history of other malignant neoplasm of rectum, rectosigmoid junction, and anus: Secondary | ICD-10-CM

## 2021-09-03 NOTE — Telephone Encounter (Signed)
Estill Bamberg at [facility] called to have clarification of the patient prescription of Percocet. Patient stated to the pharmacist she is taking 1 to 2 every 6 hours. Pharmacy needs an updated of the prescription. I send a secure chart to the Np stated the pharmacist needs clarification of the prescription.

## 2021-09-05 NOTE — Progress Notes (Signed)
Histology and Location of Primary Cancer: Anorectal versus Cervical Cancer with mets to pelvic nodes.  Location(s) of Symptomatic Metastases: Pelvic Nodal Mets   PET 0/12/710: Hypermetabolic right pelvic nodal metastases.  MRI Abdomen 08/01/2021: Right pelvic lesions which are new and significantly enlarged when compared to remote CTs including 07/10/2017. In this patient with a history of anorectal carcinoma, these are highly suspicious for nodal and possibly adnexal metastasis. Metachronous primary felt less likely.  No abdominal metastatic disease or acute process.    MRI Pelvis 08/01/2021: Enlarged right pelvis lesions spacious for nodal or adnexal metastases, no abdominal metastatic disease, new small left pleural effusion.  Pelvic US 04/13/2021: Unremarkable ultrasonographic appearance of the uterus and endometrium.  Nonvisualization of the bilateral ovaries.  No evidence to suggest the presence of a pelvic mass.  CT Abd/Pelvis 04/06/2021: Right pelvic complex septated/cystic lesions measuring up to 23 mm, raising concern for adnexal neoplasm versus abnormal necrotic pelvic nodes.   Pain on a scale of 0-10 is: Right low abdominal pain. Right groin and leg pain.   Past/Anticipated chemotherapy by medical oncology, if any:  Ned Card NP / Dr. Benay Spice 08/31/2021 - Disease appears localized to the right pelvis.   -Dr. Benay Spice reviewed her case with Dr. Lisbeth Renshaw.  She is likely a candidate for radiation.  Dr. Lisbeth Renshaw will see her next week.   -  Ms. Schwarz does not wish to consider systemic therapy including radiosensitizing capecitabine.  I recommend capecitabine to be given concurrent with radiation.  She declines.       Ambulatory status? Walker? Wheelchair?: Ambulatory with a cane.  SAFETY ISSUES: Prior radiation? [facility] 2019, 6 weeks Pacemaker/ICD? No Possible current pregnancy? Postmenopausal Is the patient on methotrexate? No  Current Complaints / other details:

## 2021-09-06 ENCOUNTER — Ambulatory Visit
Admission: RE | Admit: 2021-09-06 | Discharge: 2021-09-06 | Disposition: A | Discharge status: Home / Self Care | Payer: Medicare Other | Source: Ambulatory Visit | Attending: Radiation Oncology | Admitting: Radiation Oncology

## 2021-09-06 ENCOUNTER — Other Ambulatory Visit: Payer: Self-pay

## 2021-09-06 ENCOUNTER — Encounter: Payer: Self-pay | Admitting: Radiation Oncology

## 2021-09-06 VITALS — BP 79/61 | HR 51 | Temp 97.2°F | Resp 18 | Wt 107.4 lb

## 2021-09-06 DIAGNOSIS — N281 Cyst of kidney, acquired: Secondary | ICD-10-CM | POA: Diagnosis not present

## 2021-09-06 DIAGNOSIS — C775 Secondary and unspecified malignant neoplasm of intrapelvic lymph nodes: Secondary | ICD-10-CM | POA: Diagnosis not present

## 2021-09-06 DIAGNOSIS — D098 Carcinoma in situ of other specified sites: Secondary | ICD-10-CM | POA: Diagnosis not present

## 2021-09-06 DIAGNOSIS — I2510 Atherosclerotic heart disease of native coronary artery without angina pectoris: Secondary | ICD-10-CM | POA: Insufficient documentation

## 2021-09-06 DIAGNOSIS — N200 Calculus of kidney: Secondary | ICD-10-CM | POA: Insufficient documentation

## 2021-09-06 DIAGNOSIS — C218 Malignant neoplasm of overlapping sites of rectum, anus and anal canal: Secondary | ICD-10-CM | POA: Diagnosis not present

## 2021-09-06 DIAGNOSIS — M47816 Spondylosis without myelopathy or radiculopathy, lumbar region: Secondary | ICD-10-CM | POA: Diagnosis not present

## 2021-09-06 DIAGNOSIS — C20 Malignant neoplasm of rectum: Secondary | ICD-10-CM | POA: Diagnosis not present

## 2021-09-06 DIAGNOSIS — R197 Diarrhea, unspecified: Secondary | ICD-10-CM

## 2021-09-06 MED ORDER — DIPHENOXYLATE-ATROPINE 2.5-0.025 MG PO TABS: 1.0000 | ORAL_TABLET | Freq: Four times a day (QID) | ORAL | 0 refills | Status: DC | PRN

## 2021-09-06 NOTE — Progress Notes (Signed)
Radiation Oncology         [phone] ________________________________  Name: Renee Blake        MRN: [id number]  Date of Service: 09/06/2021 DOB: 1928-10-10  WU:[id number], Renee Areola, MD  Ladell Pier, MD     REFERRING PHYSICIAN: Ladell Pier, MD   DIAGNOSIS: The encounter diagnosis was Diarrhea, unspecified type.   HISTORY OF PRESENT ILLNESS: Renee Blake is a 88 y.o. female seen at the request of Dr. Benay Spice for a diagnosis of progressive metastatic carcinoma arising any other the anorectal region or perineal/cervical vaginal origin.  She received 50 grade 2 her pelvis completed in July 2020 2019 at [facility].  She has been followed with imaging and surveillance.  She has had a right pelvic complex septated cystic lesion seen versus chronic abnormal lymph nodes in January 2023 MRIs in May 2023 showing enlargement of the right pelvic lesions suspicious for nodal or adnexal metastases.  PD-L1 testing showed 0% on her rectal biopsy from 2019, but a recent PET scan showed hypermetabolism in the right pelvic side wall lymph nodes , these were measured in the external and internal iliac chain at 1.8 cm, 2 cm, and in the right obturator node 1.6 cm and each of them had SUVs above 5.  No focal hypermetabolic change in the anorectal region was noted however and no evidence of other additional disease was noted.  Of note the patient does have other medical conditions including atrial fibrillation and had been in hospice care approximately 2 years ago.  She is seen today to discuss additional options of radiotherapy for her pelvic sidewall disease.   PREVIOUS RADIATION THERAPY: Yes   09/19/17: The patient completed 50Gy to the pelvic mass of unknown origin extending from the distal rectum to the anus and cervic administered in 20 fractions.   PAST MEDICAL HISTORY:  Past Medical History:  Diagnosis Date   Acute blood loss anemia    Atrial fibrillation ([place])    a. diagnosed in 12/2018.  Rate-control pursued given not a candidate for anticoagulation   Chronic diarrhea    Dilation of biliary tract    Duodenal stricture    Esophageal stricture    Gastric ulcer    GI bleed    Hyperlipemia    Hypertension    Microscopic colitis    Nephrolithiasis    Pancreatic duct dilated    Schatzki's ring    Skin cancer    Sliding hiatal hernia    Vertigo        PAST SURGICAL HISTORY: Past Surgical History:  Procedure Laterality Date   BIOPSY  07/28/2017   Procedure: BIOPSY;  Surgeon: Rogene Houston, MD;  [facility];  Service: Endoscopy;;  rectum   COLONOSCOPY N/A 07/28/2017   Procedure: COLONOSCOPY;  Surgeon: Rogene Houston, MD;  [facility];  Service: Endoscopy;  Laterality: N/A;   ESOPHAGOGASTRODUODENOSCOPY N/A 02/27/2013   Procedure: ESOPHAGOGASTRODUODENOSCOPY (EGD);  Surgeon: Ladene Artist, MD;  [facility] END[facility];  Service: Endoscopy;  Laterality: N/A;   ESOPHAGOGASTRODUODENOSCOPY N/A 03/28/2013   Procedure: ESOPHAGOGASTRODUODENOSCOPY (EGD);  Surgeon: Rogene Houston, MD;  [facility];  Service: Endoscopy;  Laterality: N/A;   HOT HEMOSTASIS N/A 02/27/2013   Procedure: HOT HEMOSTASIS (ARGON PLASMA COAGULATION/BICAP);  Surgeon: Ladene Artist, MD;  Location: [facility] ENDOSCOPY;  Service: Endoscopy;  Laterality: N/A;   NOSE SURGERY     for skin cancer   POLYPECTOMY  07/28/2017   Procedure:  POLYPECTOMY;  Surgeon: Rogene Houston, MD;  [facility];  Service: Endoscopy;;  colon      FAMILY HISTORY:  Family History  Problem Relation Age of Onset   Heart attack Father      SOCIAL HISTORY:  reports that she has never smoked. She has never used smokeless tobacco. She reports that she does not drink alcohol and does not use drugs.  Patient is widowed and lives in [place] with her daughter.  Her family describes that her current activities include laying in bed watching TV going to the bathroom and sometimes getting up to side  to sit on the porch.  Prior to a few months ago she had been living independently, taking care of her own cooking and laundry and cleaning though she is unable to do this now.   ALLERGIES: Clonazepam, Codeine, and Sulfa antibiotics   MEDICATIONS:  Current Outpatient Medications  Medication Sig Dispense Refill   acetaminophen (TYLENOL) 500 MG tablet Take 1 tablet (500 mg total) by mouth every 6 (six) hours as needed for mild pain, moderate pain, fever or headache. 30 tablet 0   ALPRAZolam (XANAX) 0.25 MG tablet Take 1 tablet (0.25 mg total) by mouth 2 (two) times daily as needed. for anxiety 8 tablet 0   ALPRAZolam (XANAX) 0.5 MG tablet Take 0.5 mg by mouth at bedtime.     diltiazem (CARDIZEM SR) 90 MG 12 hr capsule Take 1 capsule (90 mg total) by mouth 2 (two) times daily. 180 capsule 1   levothyroxine (SYNTHROID) 50 MCG tablet Take 50 mcg by mouth daily.     loperamide (IMODIUM A-D) 2 MG tablet Take 4 mg by mouth daily as needed for diarrhea or loose stools.     ondansetron (ZOFRAN-ODT) 4 MG disintegrating tablet Take 4 mg by mouth as needed.     oxyCODONE-acetaminophen (PERCOCET) 10-325 MG tablet Take 1 tablet by mouth every 8 (eight) hours as needed. 75 tablet 0   potassium chloride SA (KLOR-CON) 20 MEQ tablet Take 1 tablet (20 mEq total) by mouth daily. (Patient taking differently: Take 20 mEq by mouth every other day.) 90 tablet 1   Probiotic Product (PROBIOTIC DAILY PO) Take 1 tablet by mouth daily. One daily. Not sure of name but has to be refrigerated.     torsemide (DEMADEX) 20 MG tablet Take 2 tablets (40 mg total) by mouth every morning. 60 tablet 6   diphenoxylate-atropine (LOMOTIL) 2.5-0.025 MG tablet Take 1 tablet by mouth 4 (four) times daily as needed for diarrhea or loose stools. 30 tablet 0   metoprolol succinate (TOPROL-XL) 25 MG 24 hr tablet Take 1 tablet (25 mg total) by mouth daily. 90 tablet 1   No current facility-administered medications for this encounter.      REVIEW OF SYSTEMS: On review of systems, the patient is very sleepy, she is quite fatigued from her narcotics but also her low blood pressure I suspect.  She is not having any chest pain or shortness of breath.  She describes taking pain medicine for discomfort in her right pelvic region that radiates from the lateral right flank down into the groin.  She denies any lower extremity edema.  She describes the pain as sometimes throbbing but constant.  She also has chronic ongoing diarrhea, the source of which may be from her prior radiation but in the notes from her radiation visit about 7 months after her treatment she had been experiencing constipation.  No other complaints are verbalized.  PHYSICAL EXAM:  Wt Readings from Last 3 Encounters:  09/06/21 107 lb 6.4 oz (48.7 kg)  08/31/21 106 lb (48.1 kg)  08/13/21 105 lb 12.8 oz (48 kg)   Temp Readings from Last 3 Encounters:  09/06/21 (!) 97.2 F (36.2 C)  08/31/21 98.1 F (36.7 C) (Oral)  08/13/21 98.1 F (36.7 C) (Oral)   BP Readings from Last 3 Encounters:  09/06/21 (!) 79/61  08/31/21 101/60  08/13/21 119/69   Pulse Readings from Last 3 Encounters:  09/06/21 (!) 51  08/31/21 100  08/13/21 83   Pain Assessment Pain Score: 8  (Right groin and down leg)/10  In general this is a chronically ill, elderly appearing Caucasian female in no acute distress.  She's alert and oriented x4 and appropriate throughout the examination. Cardiopulmonary assessment is negative for acute distress and she exhibits normal effort.     ECOG = 3  0 - Asymptomatic (Fully active, able to carry on all predisease activities without restriction)  1 - Symptomatic but completely ambulatory (Restricted in physically strenuous activity but ambulatory and able to carry out work of a light or sedentary nature. For example, light housework, office work)  2 - Symptomatic, <50% in bed during the day (Ambulatory and capable of all self care but unable to  carry out any work activities. Up and about more than 50% of waking hours)  3 - Symptomatic, >50% in bed, but not bedbound (Capable of only limited self-care, confined to bed or chair 50% or more of waking hours)  4 - Bedbound (Completely disabled. Cannot carry on any self-care. Totally confined to bed or chair)  5 - Death   Eustace Pen MM, Creech RH, Tormey DC, et al. [phone]). "Toxicity and response criteria of the [facility]". [place] Oncol. 5 (6): 649-55    LABORATORY DATA:  Lab Results  Component Value Date   WBC 6.4 08/01/2021   HGB 11.5 (L) 08/01/2021   HCT 36.1 08/01/2021   MCV 91.4 08/01/2021   PLT 320 08/01/2021   Lab Results  Component Value Date   NA 138 08/01/2021   K 3.7 08/01/2021   CL 106 08/01/2021   CO2 23 08/01/2021   Lab Results  Component Value Date   ALT 10 08/01/2021   AST 13 (L) 08/01/2021   ALKPHOS 100 08/01/2021   BILITOT 0.4 08/01/2021      RADIOGRAPHY: NM PET Image Restag (PS) Skull Base To Thigh  Result Date: 08/25/2021 CLINICAL DATA:  Subsequent treatment strategy for anorectal cancer, new right pelvic masses. EXAM: NUCLEAR MEDICINE PET SKULL BASE TO THIGH TECHNIQUE: 5.3 mCi F-18 FDG was injected intravenously. Full-ring PET imaging was performed from the skull base to thigh after the radiotracer. CT data was obtained and used for attenuation correction and anatomic localization. Fasting blood glucose: 105 mg/dl COMPARISON:  MRI abdomen/pelvis dated 08/01/2021. CT abdomen/pelvis dated 04/06/2021. CTA chest dated 11/01/2020. FINDINGS: Mediastinal blood pool activity: SUV max 2.3 Liver activity: SUV max NA NECK: No hypermetabolic cervical lymphadenopathy. Incidental CT findings: none CHEST: No hypermetabolic thoracic lymphadenopathy. No suspicious pulmonary nodules. Incidental CT findings: Atherosclerotic calcifications of the aortic arch. Coronary atherosclerosis of the LAD and right coronary artery. ABDOMEN/PELVIS: Three  hypermetabolic right pelvic nodes, as follows: --1.8 cm short axis right external iliac node (series 3/image 201), max SUV 5.3 --2.0 cm short axis right internal iliac nodal mass (series 3/image 206) max SUV 7.0 --1.6 cm short axis right obturator node (series 3/image 207), max SUV 13.4  No focal hypermetabolism in the anorectal region to suggest residual tumor. No abnormal hypermetabolism in the liver, spleen, pancreas, or adrenal glands. Incidental CT findings: 4 mm nonobstructing right lower pole renal calculus. Right upper pole renal cyst. Atherosclerotic calcifications the abdominal aorta and branch vessels. SKELETON: No focal hypermetabolic activity to suggest skeletal metastasis. Incidental CT findings: Lumbar levoscoliosis with degenerative changes. IMPRESSION: Hypermetabolic right pelvic nodal metastases, as above. Electronically Signed   By: Julian Hy M.D.   On: 08/25/2021 21:56       IMPRESSION/PLAN: 1. 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  Dr. Lisbeth Renshaw discusses the findings from her recent imaging, and also goes through her prior treatment records to discuss her previous therapy.  While the patient is not interested in any systemic therapy and her age and comorbidities make this more risky, at this point she is really only interested in localized therapy to help her quality of life.  She is open to trying to simulate today, we did discuss that her quality of life at current is somewhat marginal as well and that it would not be unreasonable to consider hospice based care.  She is open to this at the conclusion of radiation, and also earlier enrollment if she were not able to tolerate side effects of radiotherapy.  We discussed the risks, benefits, short and long-term effects of radiotherapy.Written consent is obtained and placed in the chart, a copy was provided to the patient.  She will simulate today.  In a visit lasting 60 minutes, greater than  50% of the time was spent face to face discussing the patient's condition, in preparation for the discussion, and coordinating the patient's care.  The above documentation reflects my direct findings during this shared patient visit. Please see the separate note by Dr. Lisbeth Renshaw on this date for the remainder of the patient's plan of care.    Carola Rhine, [facility]   **Disclaimer: This note was dictated with voice recognition software. Similar sounding words can inadvertently be transcribed and this note may contain transcription errors which may not have been corrected upon publication of note.**

## 2021-09-06 NOTE — Addendum Note (Signed)
Encounter addended by: Cori Razor, RN on: 09/06/2021 12:50 PM  Actions taken: Charge Capture section accepted

## 2021-09-11 ENCOUNTER — Encounter: Payer: Self-pay | Admitting: Radiation Oncology

## 2021-09-11 NOTE — Progress Notes (Signed)
Pt and family called to let us know that she has decided to forgo radiation treatment. We will send a message as well to Dr. Benay Spice and cancel her scheduled visits in our office.  Marland Kitchenacp

## 2021-09-12 ENCOUNTER — Ambulatory Visit: Payer: Medicare Other | Admitting: Radiation Oncology

## 2021-09-13 ENCOUNTER — Ambulatory Visit: Payer: Medicare Other

## 2021-09-14 ENCOUNTER — Ambulatory Visit: Payer: Medicare Other

## 2021-09-17 ENCOUNTER — Telehealth: Payer: Self-pay

## 2021-09-17 ENCOUNTER — Ambulatory Visit: Payer: Medicare Other

## 2021-09-17 NOTE — Telephone Encounter (Signed)
TC from Pt's daughter stating Pt's pain medication is not working Pt's daughter informed me Pt is taking percocet every 8 hours for pain and half a tablet in between. Pt's daughter inquired if she could have an appointment with Dr Benay Spice tomorrow. Inquired if Pt decided to do radiation and Pt's daughter stated she declined the treatment. Discussed with Dr Benay Spice who stated Pt can take 1-2 percocet every 4 hours and if Pt doesn't want any treatment they can discuss hospice referral tomorrow. Appointment confirmed Pt's daughter verbalized understanding.

## 2021-09-17 NOTE — Progress Notes (Deleted)
Cardiology Office Note:    Date:  09/17/2021   ID:  Renee Blake, DOB March 06, 1929, MRN [id number]  PCP:  [name], MD  Cardiologist:  None  Electrophysiologist:  None   Referring MD: [name], MD   No chief complaint on file.   History of Present Illness:    Renee Blake is a 88 y.o. female with a hx of chronic diastolic heart failure, rectal cancer, CKD stage III, atrial fibrillation, hypertension, hyperlipidemia, GI bleeding who presents for follow-up.  She was admitted in April 2022 with decompensated heart failure.  Diuresed with IV Lasix and was discharged on torsemide 60 mg every morning/30 mg nightly.  She was rate controlled on Cardizem and metoprolol for [name], but has not been in anticoagulation due to history of GI bleeding.  At follow-up visit in June 2022, her torsemide dose was decreased to 40 mg daily  Echocardiogram 07/26/2020 showed EF 65 to 70%, normal RV function, mild to moderate MR, mild to moderate TR.  Since last clinic visit, she was admitted in May 2023 with abdominal pain and weight loss.  MRI abdomen suggested recurrence of anorectal cancer.  PET scan showed hypermetabolic right pelvic nodal metastasis.  she reports has been doing well.  She did have to stop taking potassium supplement, as it was causing significant nausea.  Reports has been weighing herself daily and has been stable at 116 to 120 pounds.  Denies any chest pain, dyspnea, lightheadedness, syncope, lower extremity edema, or palpitations.   Wt Readings from Last 3 Encounters:  09/06/21 107 lb 6.4 oz (48.7 kg)  08/31/21 106 lb (48.1 kg)  08/13/21 105 lb 12.8 oz (48 kg)     Past Medical History:  Diagnosis Date   Acute blood loss anemia    Atrial fibrillation ([place])    a. diagnosed in 12/2018. Rate-control pursued given not a candidate for anticoagulation   Chronic diarrhea    Dilation of biliary tract    Duodenal stricture    Esophageal stricture    Gastric ulcer     GI bleed    Hyperlipemia    Hypertension    Microscopic colitis    Nephrolithiasis    Pancreatic duct dilated    Schatzki's ring    Skin cancer    Sliding hiatal hernia    Vertigo     Past Surgical History:  Procedure Laterality Date   BIOPSY  07/28/2017   Procedure: BIOPSY;  Surgeon: Rogene Houston, MD;  [facility];  Service: Endoscopy;;  rectum   COLONOSCOPY N/A 07/28/2017   Procedure: COLONOSCOPY;  Surgeon: Rogene Houston, MD;  [facility];  Service: Endoscopy;  Laterality: N/A;   ESOPHAGOGASTRODUODENOSCOPY N/A 02/27/2013   Procedure: ESOPHAGOGASTRODUODENOSCOPY (EGD);  Surgeon: Ladene Artist, MD;  [facility] ENDOSCOPY;  Service: Endoscopy;  Laterality: N/A;   ESOPHAGOGASTRODUODENOSCOPY N/A 03/28/2013   Procedure: ESOPHAGOGASTRODUODENOSCOPY (EGD);  Surgeon: Rogene Houston, MD;  [facility];  Service: Endoscopy;  Laterality: N/A;   HOT HEMOSTASIS N/A 02/27/2013   Procedure: HOT HEMOSTASIS (ARGON PLASMA COAGULATION/BICAP);  Surgeon: Ladene Artist, MD;  Location: [facility] ENDOSCOPY;  Service: Endoscopy;  Laterality: N/A;   NOSE SURGERY     for skin cancer   POLYPECTOMY  07/28/2017   Procedure: POLYPECTOMY;  Surgeon: Rogene Houston, MD;  [facility];  Service: Endoscopy;;  colon     Current Medications: No outpatient medications have been marked as taking for  the 09/20/21 encounter (Appointment) with Donato Heinz, MD.     Allergies:   Clonazepam, Codeine, and Sulfa antibiotics   Social History   Socioeconomic History   Marital status: Widowed    Spouse name: Not on file   Number of children: Not on file   Years of education: Not on file   Highest education level: Not on file  Occupational History   Not on file  Tobacco Use   Smoking status: Never   Smokeless tobacco: Never  Vaping Use   Vaping Use: Never used  Substance and Sexual Activity   Alcohol use: No    Alcohol/week: 0.0 standard drinks of  alcohol   Drug use: No   Sexual activity: Not on file  Other Topics Concern   Not on file  Social History Narrative   Not on file   Social Determinants of Health   Financial Resource Strain: Not on file  Food Insecurity: Not on file  Transportation Needs: Not on file  Physical Activity: Not on file  Stress: Not on file  Social Connections: Not on file     Family History: The patient's family history includes Heart attack in her father.  ROS:   Please see the history of present illness.     All other systems reviewed and are negative.  EKGs/Labs/Other Studies Reviewed:    The following studies were reviewed today:   EKG:  EKG is not ordered today.   Recent Labs: 04/06/2021: B Natriuretic Peptide 198.0; Magnesium 1.7 08/01/2021: ALT 10; BUN 15; Creatinine, Ser 1.01; Hemoglobin 11.5; Platelets 320; Potassium 3.7; Sodium 138  Recent Lipid Panel No results found for: "CHOL", "TRIG", "HDL", "CHOLHDL", "VLDL", "LDLCALC", "LDLDIRECT"  Physical Exam:    VS:  There were no vitals taken for this visit.    Wt Readings from Last 3 Encounters:  09/06/21 107 lb 6.4 oz (48.7 kg)  08/31/21 106 lb (48.1 kg)  08/13/21 105 lb 12.8 oz (48 kg)     GEN:  in no acute distress HEENT: Normal NECK: No JVD; No carotid bruits CARDIAC: Irregular, normal rate, no murmurs, rubs, gallops RESPIRATORY:  Clear to auscultation without rales, wheezing or rhonchi  ABDOMEN: Soft, non-tender, non-distended MUSCULOSKELETAL:  No edema; No deformity  SKIN: Warm and dry NEUROLOGIC:  Alert and oriented x 3 PSYCHIATRIC:  Normal affect   ASSESSMENT:    No diagnosis found.  PLAN:    Atrial fibrillation: Appears chronic.  Has not been on anticoagulation due to GI bleeding history -Continue diltiazem 90 mg twice daily and Toprol-XL 25 mg daily -Daughter reports that she has been unable to tolerate even aspirin due to GI bleeding.  Will continue to hold off on anticoagulation  Chronic diastolic heart  failure: On torsemide 40 mg daily.  Echocardiogram 07/26/2020 showed EF 65 to 70%, normal RV function, mild to moderate MR, mild to moderate TR. -Appears euvolemic.  She stopped taking potassium supplement due to nausea.  Will check BMP/magnesium  Hypertension: On Toprol-XL, diltiazem, and torsemide.  Appears controlled  CKD stage III: Will check BMP as above  Rectal cancer: Previously treated with radiation.  She was admitted in May 2023 with abdominal pain and weight loss.  MRI abdomen suggested recurrence of anorectal cancer.  PET scan showed hypermetabolic right pelvic nodal metastasis.   RTC in ***   Medication Adjustments/Labs and Tests Ordered: Current medicines are reviewed at length with the patient today.  Concerns regarding medicines are outlined above.  No orders of the defined  types were placed in this encounter.  No orders of the defined types were placed in this encounter.    There are no Patient Instructions on file for this visit.   Signed, Donato Heinz, MD  09/17/2021 2:02 PM    [facility]

## 2021-09-18 ENCOUNTER — Ambulatory Visit: Payer: Medicare Other

## 2021-09-18 ENCOUNTER — Telehealth: Payer: Self-pay

## 2021-09-18 ENCOUNTER — Encounter: Payer: Self-pay | Admitting: Nurse Practitioner

## 2021-09-18 ENCOUNTER — Inpatient Hospital Stay (HOSPITAL_BASED_OUTPATIENT_CLINIC_OR_DEPARTMENT_OTHER): Payer: Medicare Other | Admitting: Nurse Practitioner

## 2021-09-18 ENCOUNTER — Other Ambulatory Visit: Payer: Self-pay

## 2021-09-18 VITALS — BP 128/74 | HR 86 | Temp 98.1°F | Resp 18 | Ht 66.0 in | Wt 107.0 lb

## 2021-09-18 DIAGNOSIS — Z85048 Personal history of other malignant neoplasm of rectum, rectosigmoid junction, and anus: Secondary | ICD-10-CM

## 2021-09-18 DIAGNOSIS — Z87442 Personal history of urinary calculi: Secondary | ICD-10-CM | POA: Diagnosis not present

## 2021-09-18 DIAGNOSIS — Z85828 Personal history of other malignant neoplasm of skin: Secondary | ICD-10-CM | POA: Diagnosis not present

## 2021-09-18 DIAGNOSIS — Z8711 Personal history of peptic ulcer disease: Secondary | ICD-10-CM | POA: Diagnosis not present

## 2021-09-18 DIAGNOSIS — Z923 Personal history of irradiation: Secondary | ICD-10-CM | POA: Diagnosis not present

## 2021-09-18 DIAGNOSIS — G893 Neoplasm related pain (acute) (chronic): Secondary | ICD-10-CM | POA: Diagnosis not present

## 2021-09-18 DIAGNOSIS — C20 Malignant neoplasm of rectum: Secondary | ICD-10-CM | POA: Diagnosis not present

## 2021-09-18 DIAGNOSIS — I4891 Unspecified atrial fibrillation: Secondary | ICD-10-CM | POA: Diagnosis not present

## 2021-09-18 DIAGNOSIS — R197 Diarrhea, unspecified: Secondary | ICD-10-CM | POA: Diagnosis not present

## 2021-09-18 MED ORDER — OXYCODONE-ACETAMINOPHEN 10-325 MG PO TABS: 1.0000 | ORAL_TABLET | ORAL | 0 refills | Status: DC | PRN

## 2021-09-18 MED ORDER — MORPHINE SULFATE ER 15 MG PO TBCR: 15.0000 mg | EXTENDED_RELEASE_TABLET | Freq: Two times a day (BID) | ORAL | 0 refills | Status: DC

## 2021-09-18 NOTE — Telephone Encounter (Signed)
Place a referral for hospice to [facility] noted Dr Benay Spice will be the attending.

## 2021-09-18 NOTE — Progress Notes (Signed)
[place] OFFICE PROGRESS NOTE   Diagnosis: Anorectal versus cervical cancer  INTERVAL HISTORY:   Renee Blake returns for follow-up.  She decided against radiation and declines chemotherapy.  Her daughter contacted the office yesterday to report poor pain control.  She is seen today for further evaluation.  She is accompanied by her 2 daughters.  They report pain is poorly controlled.  She currently takes Percocet 1 tablet every 6 hours as needed.  She has not tried taking it more frequently or taking 2 tablets.  The pain continues to be right lower abdomen radiating to the right leg.  Objective:  Vital signs in last 24 hours:  Blood pressure 128/74, pulse 86, temperature 98.1 F (36.7 C), resp. rate 18, height 5' 6"  (1.676 m), weight 107 lb (48.5 kg), SpO2 99 %.     Resp: Lungs clear bilaterally. Cardio: Regular rate and rhythm. GI: Abdomen soft and nontender.  No hepatosplenomegaly.  No mass. Vascular: No leg edema.   Lab Results:  Lab Results  Component Value Date   WBC 6.4 08/01/2021   HGB 11.5 (L) 08/01/2021   HCT 36.1 08/01/2021   MCV 91.4 08/01/2021   PLT 320 08/01/2021   NEUTROABS 4.9 08/01/2021    Imaging:  No results found.  Medications: I have reviewed the patient's current medications.  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-primary site not confirmed Colonoscopy 07/28/2017-firm mass at the anterior aspect of the anal orifice with central ulceration-submucosal, 6 mm polyp in the distal sigmoid colon-biopsy invasive moderately differentiated squamous cell carcinoma of the rectum, inflammatory polyp 07/30/2017-[facility] vaginal vestibule biopsy-moderately differentiated squamous of carcinoma, keratinizing, unable to differentiate between anal rectal, perineal/vulvar, or cervicovaginal origin 08/01/2017-PET-hypermetabolic mass in the distal rectum extending to the anus and invading the posterior cervix, likely  reactive hilar node, indeterminate thyroid uptake Chest CT at [facility] 08/02/1998 19-0.9 cm indeterminate right apical nodule and 0.4 cm nodule at the minor fissure, 0.6 cm enhancing splenic lesion  XRT, 5000 cGy to pelvic mass completed 09/19/2017 PET 12/31/2017-markedly decreased conspicuity of anorectal mass, unchanged moderately avid right hilar node, unchanged right thyroid nodule, indeterminate none avid left adnexal cystic lesion CT abdomen/pelvis 04/06/2021-right pelvic complex septated/cystic lesions-adnexal neoplasm versus abnormal chronic lymph nodes MRI abdomen, MRI pelvis 08/01/2021-enlarged right pelvic lesions spacious for nodal or adnexal metastases, no abdominal metastatic disease, new small left pleural effusion PD-1 testing on the 2019 rectal biopsy-PD-L1 CPS 0% 08/23/2021 PET scan-hypermetabolic right pelvic nodal metastases. Referred for radiation Declines radiation and chemotherapy Referred to hospice 09/18/2021 Pain secondary to adnexal recurrence of squamous of carcinoma Atrial fibrillation Chronic diarrhea History of a gastric ulcer History of skin cancers History of nephrolithiasis  Disposition: Renee Blake has decided against radiation and chemotherapy.  She would like to focus on pain control.  We discussed a hospice referral.  [name] is in agreement.  CPR/end-of-life issues discussed.  She will be placed on No Code Blue status.  We adjusted her pain medication.  She will begin MS Contin 15 mg every 12 hours, continue Percocet 1 to 2 tablets every 4 hours as needed for breakthrough pain.  New prescriptions sent to her pharmacy for both.  She will return for follow-up in 3 weeks.  We are available to see her sooner if needed.  Patient seen with Dr. Benay Spice.    Ned Card ANP/GNP-BC   09/18/2021  8:57 AM  This was a shared visit with Ned Card.  Renee Blake was interviewed and  examined.  Her daughters were present for today's visit.  Renee Blake has decided against systemic  therapy and radiation.  She agrees to enroll in hospice care.  She will be placed on a no CODE BLUE status.  We adjusted the narcotic regimen today.  I was greater for than 50% of today's visit.  I performed medical decision making.  Julieanne Manson, MD

## 2021-09-19 ENCOUNTER — Ambulatory Visit: Payer: Medicare Other

## 2021-09-19 DIAGNOSIS — R413 Other amnesia: Secondary | ICD-10-CM | POA: Diagnosis not present

## 2021-09-19 DIAGNOSIS — C20 Malignant neoplasm of rectum: Secondary | ICD-10-CM | POA: Diagnosis not present

## 2021-09-19 DIAGNOSIS — M545 Low back pain, unspecified: Secondary | ICD-10-CM | POA: Diagnosis not present

## 2021-09-20 ENCOUNTER — Ambulatory Visit: Payer: Medicare Other | Admitting: Cardiology

## 2021-09-20 ENCOUNTER — Ambulatory Visit: Payer: Medicare Other

## 2021-09-21 ENCOUNTER — Ambulatory Visit: Payer: Medicare Other

## 2021-09-24 ENCOUNTER — Ambulatory Visit: Payer: Medicare Other

## 2021-09-24 ENCOUNTER — Telehealth: Payer: Self-pay | Admitting: *Deleted

## 2021-09-24 ENCOUNTER — Telehealth: Payer: Self-pay

## 2021-09-24 NOTE — Telephone Encounter (Signed)
I called and spoke with Okey Regal from [facility] about the patient pain control, and she want to have a order to go to the [facility] [employer]. Per Misty Stanley it is  fine to give a verbal order to go to the [facility] for symptom management. Order was giving and Lisa/Sherrill advice the patient to take oxycodone 10 mg q 12 hr.

## 2021-09-25 ENCOUNTER — Ambulatory Visit: Payer: Medicare Other

## 2021-09-27 ENCOUNTER — Telehealth: Payer: Self-pay

## 2021-09-27 ENCOUNTER — Inpatient Hospital Stay: Payer: Medicare Other | Admitting: Oncology

## 2021-09-27 NOTE — Telephone Encounter (Signed)
VM received: Spoke with pt's daughter regarding pt's pain management at [facility]. This RN encouraged pt's daughter to discuss pain management with pt's hospice team. Pt's daughter asked "How fast is her cancer growing in her lymph nodes?". This RN informed pt's daughter that labs and imaging have not been completed recently enough to determine cancer growth rate. Pt's daughter also informed that since her mother is in hospice, managing her pain and providing comfort measures are the priority at this time. Pt's daughter verbalizes understanding, appreciative of call back and stated that pt no longer will need 10/08/21 appt at [facility]

## 2021-10-08 ENCOUNTER — Inpatient Hospital Stay: Payer: Medicare Other | Admitting: Oncology

## 2021-10-12 ENCOUNTER — Telehealth: Payer: Self-pay

## 2021-10-12 NOTE — Telephone Encounter (Signed)
Correspondence from [facility] passed away on 2021/10/18.

## 2021-10-30 DEATH — deceased

## 2022-01-15 ENCOUNTER — Encounter: Payer: Self-pay | Admitting: Cardiology

## 2022-01-15 NOTE — Telephone Encounter (Signed)
Error

## 2022-05-23 ENCOUNTER — Ambulatory Visit (INDEPENDENT_AMBULATORY_CARE_PROVIDER_SITE_OTHER): Payer: Medicare Other | Admitting: Gastroenterology
